# Patient Record
Sex: Female | Born: 1980
Health system: Southern US, Community
[De-identification: ages and names within clinical notes are randomized; demographics above are authoritative.]

## PROBLEM LIST (undated history)

## (undated) DIAGNOSIS — K589 Irritable bowel syndrome without diarrhea: Secondary | ICD-10-CM

## (undated) DIAGNOSIS — Z9049 Acquired absence of other specified parts of digestive tract: Secondary | ICD-10-CM

## (undated) DIAGNOSIS — F429 Obsessive-compulsive disorder, unspecified: Secondary | ICD-10-CM

## (undated) DIAGNOSIS — G4733 Obstructive sleep apnea (adult) (pediatric): Secondary | ICD-10-CM

## (undated) DIAGNOSIS — K219 Gastro-esophageal reflux disease without esophagitis: Secondary | ICD-10-CM

## (undated) DIAGNOSIS — M1990 Unspecified osteoarthritis, unspecified site: Secondary | ICD-10-CM

## (undated) DIAGNOSIS — G4730 Sleep apnea, unspecified: Secondary | ICD-10-CM

## (undated) DIAGNOSIS — R011 Cardiac murmur, unspecified: Secondary | ICD-10-CM

## (undated) DIAGNOSIS — Q231 Congenital insufficiency of aortic valve: Secondary | ICD-10-CM

## (undated) DIAGNOSIS — Z9221 Personal history of antineoplastic chemotherapy: Secondary | ICD-10-CM

## (undated) DIAGNOSIS — G43909 Migraine, unspecified, not intractable, without status migrainosus: Secondary | ICD-10-CM

## (undated) DIAGNOSIS — I10 Essential (primary) hypertension: Secondary | ICD-10-CM

## (undated) DIAGNOSIS — T7840XA Allergy, unspecified, initial encounter: Secondary | ICD-10-CM

## (undated) DIAGNOSIS — E119 Type 2 diabetes mellitus without complications: Secondary | ICD-10-CM

## (undated) DIAGNOSIS — K7581 Nonalcoholic steatohepatitis (NASH): Secondary | ICD-10-CM

## (undated) DIAGNOSIS — Z803 Family history of malignant neoplasm of breast: Secondary | ICD-10-CM

## (undated) DIAGNOSIS — Z800 Family history of malignant neoplasm of digestive organs: Secondary | ICD-10-CM

## (undated) DIAGNOSIS — G4700 Insomnia, unspecified: Secondary | ICD-10-CM

## (undated) DIAGNOSIS — G709 Myoneural disorder, unspecified: Secondary | ICD-10-CM

## (undated) DIAGNOSIS — F329 Major depressive disorder, single episode, unspecified: Secondary | ICD-10-CM

## (undated) DIAGNOSIS — K760 Fatty (change of) liver, not elsewhere classified: Secondary | ICD-10-CM

## (undated) DIAGNOSIS — E785 Hyperlipidemia, unspecified: Secondary | ICD-10-CM

## (undated) DIAGNOSIS — F32A Depression, unspecified: Secondary | ICD-10-CM

## (undated) DIAGNOSIS — F419 Anxiety disorder, unspecified: Secondary | ICD-10-CM

## (undated) DIAGNOSIS — H05019 Cellulitis of unspecified orbit: Secondary | ICD-10-CM

## (undated) DIAGNOSIS — R7303 Prediabetes: Secondary | ICD-10-CM

## (undated) DIAGNOSIS — C229 Malignant neoplasm of liver, not specified as primary or secondary: Secondary | ICD-10-CM

## (undated) DIAGNOSIS — N189 Chronic kidney disease, unspecified: Secondary | ICD-10-CM

## (undated) DIAGNOSIS — J45909 Unspecified asthma, uncomplicated: Secondary | ICD-10-CM

## (undated) HISTORY — DX: Unspecified asthma, uncomplicated: J45.909

## (undated) HISTORY — DX: Depression, unspecified: F32.A

## (undated) HISTORY — DX: Myoneural disorder, unspecified: G70.9

## (undated) HISTORY — DX: Major depressive disorder, single episode, unspecified: F32.9

## (undated) HISTORY — PX: APPENDECTOMY: SHX54

## (undated) HISTORY — DX: Cardiac murmur, unspecified: R01.1

## (undated) HISTORY — PX: ESOPHAGOGASTRODUODENOSCOPY: SHX1529

## (undated) HISTORY — PX: DENTAL SURGERY: SHX609

## (undated) HISTORY — DX: Chronic kidney disease, unspecified: N18.9

## (undated) HISTORY — PX: UPPER GASTROINTESTINAL ENDOSCOPY: SHX188

## (undated) HISTORY — DX: Family history of malignant neoplasm of breast: Z80.3

## (undated) HISTORY — DX: Prediabetes: R73.03

## (undated) HISTORY — DX: Family history of malignant neoplasm of digestive organs: Z80.0

## (undated) HISTORY — DX: Anxiety disorder, unspecified: F41.9

## (undated) HISTORY — DX: Obsessive-compulsive disorder, unspecified: F42.9

## (undated) HISTORY — DX: Obstructive sleep apnea (adult) (pediatric): G47.33

## (undated) HISTORY — DX: Type 2 diabetes mellitus without complications: E11.9

## (undated) HISTORY — DX: Personal history of antineoplastic chemotherapy: Z92.21

## (undated) HISTORY — DX: Congenital insufficiency of aortic valve: Q23.1

## (undated) HISTORY — DX: Unspecified osteoarthritis, unspecified site: M19.90

## (undated) HISTORY — DX: Sleep apnea, unspecified: G47.30

## (undated) HISTORY — PX: LIVER BIOPSY: SHX301

## (undated) HISTORY — DX: Allergy, unspecified, initial encounter: T78.40XA

## (undated) HISTORY — DX: Hyperlipidemia, unspecified: E78.5

## (undated) HISTORY — DX: Cellulitis of unspecified orbit: H05.019

## (undated) HISTORY — DX: Fatty (change of) liver, not elsewhere classified: K76.0

## (undated) HISTORY — DX: Nonalcoholic steatohepatitis (NASH): K75.81

## (undated) HISTORY — DX: Insomnia, unspecified: G47.00

## (undated) HISTORY — PX: COLONOSCOPY: SHX174

---

## 2012-06-02 ENCOUNTER — Emergency Department (HOSPITAL_COMMUNITY)
Admission: EM | Admit: 2012-06-02 | Discharge: 2012-06-02 | Disposition: A | Discharge status: Home / Self Care | Payer: BC Managed Care – PPO | Attending: Emergency Medicine | Admitting: Emergency Medicine

## 2012-06-02 ENCOUNTER — Encounter (HOSPITAL_COMMUNITY): Payer: Self-pay | Admitting: Emergency Medicine

## 2012-06-02 DIAGNOSIS — F17200 Nicotine dependence, unspecified, uncomplicated: Secondary | ICD-10-CM | POA: Insufficient documentation

## 2012-06-02 DIAGNOSIS — K029 Dental caries, unspecified: Secondary | ICD-10-CM

## 2012-06-02 DIAGNOSIS — I10 Essential (primary) hypertension: Secondary | ICD-10-CM | POA: Insufficient documentation

## 2012-06-02 HISTORY — DX: Essential (primary) hypertension: I10

## 2012-06-02 MED ORDER — FLUCONAZOLE 150 MG PO TABS: 150.0000 mg | ORAL_TABLET | Freq: Once | ORAL | Status: DC

## 2012-06-02 MED ORDER — PENICILLIN V POTASSIUM 500 MG PO TABS: 500.0000 mg | ORAL_TABLET | Freq: Four times a day (QID) | ORAL | Status: AC

## 2012-06-02 MED ORDER — HYDROCODONE-ACETAMINOPHEN 5-500 MG PO TABS: 1.0000 | ORAL_TABLET | Freq: Four times a day (QID) | ORAL | Status: DC | PRN

## 2012-06-02 NOTE — ED Notes (Signed)
MD at bedside. 

## 2012-06-02 NOTE — ED Provider Notes (Signed)
History     CSN: [id number]  Arrival date & time 06/02/12  0531   First MD Initiated Contact with Patient 06/02/12 0559      Chief Complaint  Patient presents with  . Dental Pain    (Consider location/radiation/quality/duration/timing/severity/associated sxs/prior treatment) Patient is a 31 y.o. female presenting with tooth pain. The history is provided by the patient. No language interpreter was used.  Dental PainThe primary symptoms include dental injury. Primary symptoms do not include fever or angioedema. The symptoms began more than 1 week ago. The symptoms are worsening. The symptoms are new. The symptoms occur constantly.  Additional symptoms include: dental sensitivity to temperature. Additional symptoms do not include: gum swelling, gum tenderness, facial swelling and excessive salivation. Medical issues include: smoking.    Past Medical History  Diagnosis Date  . Hypertension     History reviewed. No pertinent past surgical history.  No family history on file.  History  Substance Use Topics  . Smoking status: Current Every Day Smoker  . Smokeless tobacco: Not on file  . Alcohol Use: No    OB History    Grav Para Term Preterm Abortions TAB SAB Ect Mult Living                  Review of Systems  Constitutional: Negative for fever.  HENT: Negative for facial swelling and neck pain.   All other systems reviewed and are negative.    Allergies  Codeine  Home Medications   Current Outpatient Rx  Name  Route  Sig  Dispense  Refill  . ASPIRIN 325 MG PO TABS   Oral   Take 975 mg by mouth every 6 (six) hours as needed. For pain         . IBUPROFEN 200 MG PO CAPS   Oral   Take 400 mg by mouth 2 (two) times daily as needed. For pain           BP 138/89  Pulse 81  Temp 98 F (36.7 C)  Resp 16  Wt 200 lb (90.719 kg)  SpO2 99%  LMP 05/26/2012  Physical Exam  Constitutional: She is oriented to person, place, and time. She appears well-developed  and well-nourished. No distress.  HENT:  Head: Normocephalic and atraumatic.  Mouth/Throat: Oropharynx is clear and moist.    Eyes: Conjunctivae normal are normal. Pupils are equal, round, and reactive to light.  Neck: Normal range of motion. Neck supple.  Cardiovascular: Normal rate and regular rhythm.   Pulmonary/Chest: Effort normal and breath sounds normal. She has no wheezes. She has no rales.  Abdominal: Soft. Bowel sounds are normal. There is no tenderness. There is no rebound and no guarding.  Neurological: She is alert and oriented to person, place, and time.  Skin: Skin is warm and dry.  Psychiatric: She has a normal mood and affect.    ED Course  Procedures (including critical care time)  Labs Reviewed - No data to display No results found.   No diagnosis found.    MDM  Caries will refer and start pain meds and abx       Fannie Gathright K Renelda Kilian-Rasch, MD 06/02/12 [phone]

## 2012-06-02 NOTE — ED Notes (Signed)
Pt alert, arrives from home, c/o dental pain, onset a few days ago, denies trauma or injury, multiple carries noted, resp even unlabored, skin pwd

## 2012-06-09 ENCOUNTER — Emergency Department (INDEPENDENT_AMBULATORY_CARE_PROVIDER_SITE_OTHER)
Admission: EM | Admit: 2012-06-09 | Discharge: 2012-06-09 | Disposition: A | Discharge status: Home / Self Care | Payer: BC Managed Care – PPO | Source: Home / Self Care

## 2012-06-09 ENCOUNTER — Encounter (HOSPITAL_COMMUNITY): Payer: Self-pay | Admitting: Emergency Medicine

## 2012-06-09 DIAGNOSIS — K089 Disorder of teeth and supporting structures, unspecified: Secondary | ICD-10-CM

## 2012-06-09 DIAGNOSIS — K0889 Other specified disorders of teeth and supporting structures: Secondary | ICD-10-CM

## 2012-06-09 DIAGNOSIS — R209 Unspecified disturbances of skin sensation: Secondary | ICD-10-CM

## 2012-06-09 DIAGNOSIS — R200 Anesthesia of skin: Secondary | ICD-10-CM

## 2012-06-09 NOTE — ED Notes (Signed)
Pt c/o sudden on set of left jaw pain and shooting pain and numbness down left arm and numbness of left side. Recent dental surgery with tooth extraction. Pt states feels weak. m

## 2012-06-09 NOTE — ED Provider Notes (Signed)
History     CSN: [id number]  Arrival date & time 06/09/12  1809   None     Chief Complaint  Patient presents with  . Numbness    recent dental surgery x 1 wk ago. pt experienced sudden on set of left jaw pain and shooting pain and numbness down left side of body. around 5 p.m today    (Consider location/radiation/quality/duration/timing/severity/associated sxs/prior treatment) Patient is a 31 y.o. female presenting with tooth pain and hand pain. The history is provided by the patient.  Dental PainThe primary symptoms include mouth pain. The symptoms began more than 1 week ago. The symptoms are waxing and waning. Episode frequency: daily.  Affected locations include: cheek(s), gum(s) and teeth. At its highest the mouth pain was at 9/10.  Additional symptoms include: jaw pain, facial swelling and ear pain. Additional symptoms do not include: dental sensitivity to temperature, gum swelling, gum tenderness, purulent gums, trismus, trouble swallowing, pain with swallowing, excessive salivation, dry mouth, taste disturbance, smell disturbance, drooling, hearing loss, nosebleeds, swollen glands, goiter and fatigue. Medical issues include: periodontal disease. Associated medical issues comments: Patient reports tooth extraction.  Since then she has noted some left sided facial pain radiating into her ear.  States she was unable to get touch with oral surgeon for follow up..   Hand Pain  Additionally complains of left hand numbness and tingling, intermittent for more than one week.  No known aggravating and alleviating factors.  Works at an office, desk work and lifting.  States no known injury.  No previous musculoskeletal injury.  Past Medical History  Diagnosis Date  . Hypertension     Past Surgical History  Procedure Date  . Dental surgery     History reviewed. No pertinent family history.  History  Substance Use Topics  . Smoking status: Current Every Day Smoker  . Smokeless  tobacco: Not on file  . Alcohol Use: No    OB History    Grav Para Term Preterm Abortions TAB SAB Ect Mult Living                  Review of Systems  Constitutional: Negative for fatigue.  HENT: Positive for ear pain and facial swelling. Negative for hearing loss, nosebleeds, drooling and trouble swallowing.   All other systems reviewed and are negative.    Allergies  Codeine  Home Medications   Current Outpatient Rx  Name  Route  Sig  Dispense  Refill  . ASPIRIN 325 MG PO TABS   Oral   Take 975 mg by mouth every 6 (six) hours as needed. For pain         . FLUCONAZOLE 150 MG PO TABS   Oral   Take 1 tablet (150 mg total) by mouth once.   1 tablet   0   . HYDROCODONE-ACETAMINOPHEN 5-500 MG PO TABS   Oral   Take 1 tablet by mouth every 6 (six) hours as needed for pain.   10 tablet   0   . IBUPROFEN 200 MG PO CAPS   Oral   Take 400 mg by mouth 2 (two) times daily as needed. For pain           BP 147/90  Pulse 79  Temp 98.7 F (37.1 C) (Oral)  Resp 20  SpO2 100%  LMP 05/26/2012  Physical Exam  Nursing note and vitals reviewed. Constitutional: She is oriented to person, place, and time. Vital signs are normal. She appears well-developed and  well-nourished. She is active and cooperative.  HENT:  Head: Normocephalic.  Right Ear: Tympanic membrane and external ear normal.  Left Ear: Tympanic membrane and external ear normal.  Nose: Nose normal.  Mouth/Throat: Uvula is midline, oropharynx is clear and moist and mucous membranes are normal. No oropharyngeal exudate.       Mild left jaw/facial swelling, no tenderness upon palpation.  No tmj abnormalities.  Eyes: Conjunctivae normal are normal. Pupils are equal, round, and reactive to light. No scleral icterus.  Neck: Trachea normal and normal range of motion. Neck supple.  Cardiovascular: Normal rate, regular rhythm, normal heart sounds and intact distal pulses.   Pulmonary/Chest: Effort normal and breath  sounds normal.  Musculoskeletal:       Right wrist: Normal.       Left wrist: Normal.       + phalen sign left wrist  Lymphadenopathy:    She has no cervical adenopathy.  Neurological: She is alert and oriented to person, place, and time. No cranial nerve deficit or sensory deficit.  Skin: Skin is warm and dry.  Psychiatric: She has a normal mood and affect. Her speech is normal and behavior is normal. Judgment and thought content normal. Cognition and memory are normal.    ED Course  Procedures (including critical care time)  Labs Reviewed - No data to display No results found.   1. Pain, dental   2. Numbness and tingling in left hand       MDM  Ace wrap for wrist.  Continue antibiotics as previously prescribed.  Follow up with oral surgeon this week as scheduled.        Awilda Metro, NP 06/15/12 [phone]

## 2012-06-15 NOTE — ED Provider Notes (Signed)
Medical screening examination/treatment/procedure(s) were performed by non-physician practitioner and as supervising physician I was immediately available for consultation/collaboration.  Philipp Deputy, M.D.   Harden Mo, MD 06/15/12 [phone]

## 2012-10-01 ENCOUNTER — Encounter (HOSPITAL_COMMUNITY): Payer: Self-pay | Admitting: Emergency Medicine

## 2012-10-01 ENCOUNTER — Emergency Department (HOSPITAL_COMMUNITY)
Admission: EM | Admit: 2012-10-01 | Discharge: 2012-10-01 | Disposition: A | Discharge status: Home / Self Care | Payer: BC Managed Care – PPO | Attending: Emergency Medicine | Admitting: Emergency Medicine

## 2012-10-01 ENCOUNTER — Emergency Department (HOSPITAL_COMMUNITY): Payer: BC Managed Care – PPO

## 2012-10-01 DIAGNOSIS — Y939 Activity, unspecified: Secondary | ICD-10-CM | POA: Insufficient documentation

## 2012-10-01 DIAGNOSIS — S92902A Unspecified fracture of left foot, initial encounter for closed fracture: Secondary | ICD-10-CM

## 2012-10-01 DIAGNOSIS — S92909A Unspecified fracture of unspecified foot, initial encounter for closed fracture: Secondary | ICD-10-CM | POA: Insufficient documentation

## 2012-10-01 DIAGNOSIS — F17200 Nicotine dependence, unspecified, uncomplicated: Secondary | ICD-10-CM | POA: Insufficient documentation

## 2012-10-01 DIAGNOSIS — Y929 Unspecified place or not applicable: Secondary | ICD-10-CM | POA: Insufficient documentation

## 2012-10-01 DIAGNOSIS — X500XXA Overexertion from strenuous movement or load, initial encounter: Secondary | ICD-10-CM | POA: Insufficient documentation

## 2012-10-01 DIAGNOSIS — I10 Essential (primary) hypertension: Secondary | ICD-10-CM | POA: Insufficient documentation

## 2012-10-01 DIAGNOSIS — R269 Unspecified abnormalities of gait and mobility: Secondary | ICD-10-CM | POA: Insufficient documentation

## 2012-10-01 MED ORDER — OXYCODONE-ACETAMINOPHEN 5-325 MG PO TABS
2.0000 | ORAL_TABLET | Freq: Once | ORAL | Status: AC
  Administered 2012-10-01: 2 via ORAL
  Filled 2012-10-01: qty 2

## 2012-10-01 MED ORDER — OXYCODONE-ACETAMINOPHEN 5-325 MG PO TABS: 1.0000 | ORAL_TABLET | ORAL | Status: DC | PRN

## 2012-10-01 NOTE — ED Notes (Signed)
Skin warm dry, pink, can wiggle digits, sensation present, cap refill less than 2 seconds, and pulse present

## 2012-10-01 NOTE — ED Notes (Signed)
Pt states she twisted R ankle on steps approx 1 1/2 hours ago and is now having R foot pain.  Took ibupofen 400 mg just pta.

## 2012-10-01 NOTE — ED Notes (Signed)
Pt states understanding of discharge instructions 

## 2012-10-01 NOTE — ED Provider Notes (Signed)
Medical screening examination/treatment/procedure(s) were performed by non-physician practitioner and as supervising physician I was immediately available for consultation/collaboration.   Wandra Arthurs, MD 10/01/12 [phone]

## 2012-10-01 NOTE — ED Provider Notes (Signed)
History     CSN: [id number]  Arrival date & time 10/01/12  0215   First MD Initiated Contact with Patient 10/01/12 0402      Chief Complaint  Patient presents with  . Foot Pain    (Consider location/radiation/quality/duration/timing/severity/associated sxs/prior treatment) HPI Comments: Patient state she stepped wrong twisting her R foot now with mid foot pain unable to bear weight Took Advil without relief   Patient is a 32 y.o. female presenting with lower extremity pain. The history is provided by the patient.  Foot Pain This is a new problem. The current episode started today. The problem has been gradually worsening. Associated symptoms include arthralgias. Pertinent negatives include no fever, joint swelling, numbness or weakness. The symptoms are aggravated by exertion. She has tried NSAIDs for the symptoms. The treatment provided no relief.    Past Medical History  Diagnosis Date  . Hypertension     Past Surgical History  Procedure Laterality Date  . Dental surgery      No family history on file.  History  Substance Use Topics  . Smoking status: Current Every Day Smoker  . Smokeless tobacco: Not on file  . Alcohol Use: No    OB History   Grav Para Term Preterm Abortions TAB SAB Ect Mult Living                  Review of Systems  Constitutional: Negative for fever.  Musculoskeletal: Positive for arthralgias and gait problem. Negative for joint swelling.  Skin: Negative for wound.  Neurological: Negative for weakness and numbness.  All other systems reviewed and are negative.    Allergies  Codeine  Home Medications   Current Outpatient Rx  Name  Route  Sig  Dispense  Refill  . aspirin 325 MG tablet   Oral   Take 975 mg by mouth every 6 (six) hours as needed. For pain         . Ibuprofen (ADVIL MIGRAINE) 200 MG CAPS   Oral   Take 400 mg by mouth 2 (two) times daily as needed. For pain         . oxyCODONE-acetaminophen (PERCOCET/ROXICET)  5-325 MG per tablet   Oral   Take 1 tablet by mouth every 4 (four) hours as needed for pain.   30 tablet   0     BP 147/101  Pulse 100  Temp(Src) 98.1 F (36.7 C) (Oral)  Resp 16  SpO2 97%  LMP 09/12/2012  Physical Exam  Constitutional: She is oriented to person, place, and time. She appears well-developed and well-nourished.  HENT:  Head: Normocephalic and atraumatic.  Eyes: Pupils are equal, round, and reactive to light.  Neck: Normal range of motion.  Cardiovascular: Normal rate.   Pulmonary/Chest: Effort normal.  Musculoskeletal: She exhibits tenderness. She exhibits no edema.       Right ankle: She exhibits decreased range of motion and swelling. She exhibits no ecchymosis, no deformity and no laceration. Tenderness.       Feet:  Neurological: She is alert and oriented to person, place, and time.  Skin: Skin is warm. No erythema.    ED Course  Procedures (including critical care time)  Labs Reviewed - No data to display Dg Foot Complete Right  10/01/2012  *RADIOLOGY REPORT*  Clinical Data: Fall, right foot pain  RIGHT FOOT COMPLETE - 3+ VIEW  Comparison: None.  Findings: Linear calcific density along the medial margin of the base of the second metatarsal.  This  can be seen with a Lisfranc ligament avulsion injury.  No appreciable subluxation or dislocation.  IMPRESSION: Linear density along the base of the second metatarsal may reflect a Lisfranc avulsion fracture. Correlate with area of pain.  No subluxation or dislocation.   Original Report Authenticated By: Carlos Levering, M.D.      1. Foot fracture, left, closed, initial encounter       MDM   Will place patient in Jones wrap, crutches, non weight bearing and FU with Dr. Doreene Burke, NP 10/01/12 0449  Garald Balding, NP 10/01/12 [phone]

## 2012-11-20 ENCOUNTER — Other Ambulatory Visit: Payer: Self-pay | Admitting: Family Medicine

## 2012-11-20 ENCOUNTER — Other Ambulatory Visit (HOSPITAL_COMMUNITY)
Admission: RE | Admit: 2012-11-20 | Discharge: 2012-11-20 | Disposition: A | Discharge status: Home / Self Care | Payer: BC Managed Care – PPO | Source: Ambulatory Visit | Attending: Family Medicine | Admitting: Family Medicine

## 2012-11-20 DIAGNOSIS — Z124 Encounter for screening for malignant neoplasm of cervix: Secondary | ICD-10-CM | POA: Insufficient documentation

## 2013-05-20 ENCOUNTER — Emergency Department (HOSPITAL_COMMUNITY)
Admission: EM | Admit: 2013-05-20 | Discharge: 2013-05-20 | Disposition: A | Discharge status: Home / Self Care | Payer: No Typology Code available for payment source | Source: Home / Self Care

## 2013-05-25 DIAGNOSIS — Z9049 Acquired absence of other specified parts of digestive tract: Secondary | ICD-10-CM

## 2013-05-25 HISTORY — DX: Acquired absence of other specified parts of digestive tract: Z90.49

## 2013-06-05 ENCOUNTER — Ambulatory Visit: Payer: BC Managed Care – PPO

## 2013-06-06 ENCOUNTER — Encounter (HOSPITAL_COMMUNITY): Payer: Self-pay | Admitting: Emergency Medicine

## 2013-06-06 ENCOUNTER — Observation Stay (HOSPITAL_COMMUNITY)
Admission: EM | Admit: 2013-06-06 | Discharge: 2013-06-07 | Disposition: A | Discharge status: Home / Self Care | Payer: PRIVATE HEALTH INSURANCE | Attending: Surgery | Admitting: Surgery

## 2013-06-06 ENCOUNTER — Emergency Department (HOSPITAL_COMMUNITY)
Admission: EM | Admit: 2013-06-06 | Discharge: 2013-06-06 | Disposition: A | Discharge status: Hospice | Payer: PRIVATE HEALTH INSURANCE | Source: Home / Self Care | Attending: Emergency Medicine | Admitting: Emergency Medicine

## 2013-06-06 ENCOUNTER — Encounter (HOSPITAL_COMMUNITY)
Admission: EM | Disposition: A | Discharge status: Home / Self Care | Payer: Self-pay | Source: Home / Self Care | Attending: Emergency Medicine

## 2013-06-06 ENCOUNTER — Emergency Department (HOSPITAL_COMMUNITY): Payer: PRIVATE HEALTH INSURANCE

## 2013-06-06 ENCOUNTER — Observation Stay (HOSPITAL_COMMUNITY): Payer: PRIVATE HEALTH INSURANCE | Admitting: Anesthesiology

## 2013-06-06 ENCOUNTER — Encounter (HOSPITAL_COMMUNITY): Payer: PRIVATE HEALTH INSURANCE | Admitting: Anesthesiology

## 2013-06-06 DIAGNOSIS — I10 Essential (primary) hypertension: Secondary | ICD-10-CM

## 2013-06-06 DIAGNOSIS — R1031 Right lower quadrant pain: Secondary | ICD-10-CM

## 2013-06-06 DIAGNOSIS — Z23 Encounter for immunization: Secondary | ICD-10-CM | POA: Insufficient documentation

## 2013-06-06 DIAGNOSIS — K219 Gastro-esophageal reflux disease without esophagitis: Secondary | ICD-10-CM

## 2013-06-06 DIAGNOSIS — K37 Unspecified appendicitis: Secondary | ICD-10-CM

## 2013-06-06 DIAGNOSIS — K3580 Unspecified acute appendicitis: Principal | ICD-10-CM | POA: Insufficient documentation

## 2013-06-06 DIAGNOSIS — K589 Irritable bowel syndrome without diarrhea: Secondary | ICD-10-CM

## 2013-06-06 DIAGNOSIS — Z87891 Personal history of nicotine dependence: Secondary | ICD-10-CM | POA: Insufficient documentation

## 2013-06-06 HISTORY — DX: Essential (primary) hypertension: I10

## 2013-06-06 HISTORY — PX: LAPAROSCOPIC APPENDECTOMY: SHX408

## 2013-06-06 HISTORY — DX: Irritable bowel syndrome, unspecified: K58.9

## 2013-06-06 HISTORY — DX: Gastro-esophageal reflux disease without esophagitis: K21.9

## 2013-06-06 LAB — URINALYSIS, ROUTINE W REFLEX MICROSCOPIC
Bilirubin Urine: NEGATIVE
Bilirubin Urine: NEGATIVE
Glucose, UA: NEGATIVE mg/dL
Hgb urine dipstick: NEGATIVE
Nitrite: NEGATIVE
Protein, ur: NEGATIVE mg/dL
Specific Gravity, Urine: 1.034 — ABNORMAL HIGH (ref 1.005–1.030)
Urobilinogen, UA: 0.2 mg/dL (ref 0.0–1.0)
pH: 6 (ref 5.0–8.0)

## 2013-06-06 LAB — COMPREHENSIVE METABOLIC PANEL
AST: 21 U/L (ref 0–37)
Albumin: 3.2 g/dL — ABNORMAL LOW (ref 3.5–5.2)
Alkaline Phosphatase: 48 U/L (ref 39–117)
BUN: 14 mg/dL (ref 6–23)
Calcium: 8.6 mg/dL (ref 8.4–10.5)
Chloride: 104 mEq/L (ref 96–112)
Potassium: 4 mEq/L (ref 3.5–5.1)
Sodium: 138 mEq/L (ref 135–145)
Total Protein: 6.4 g/dL (ref 6.0–8.3)

## 2013-06-06 LAB — CBC WITH DIFFERENTIAL/PLATELET
Basophils Absolute: 0 10*3/uL (ref 0.0–0.1)
Basophils Relative: 0 % (ref 0–1)
Eosinophils Absolute: 0.3 10*3/uL (ref 0.0–0.7)
Eosinophils Relative: 3 % (ref 0–5)
MCH: 29.8 pg (ref 26.0–34.0)
MCHC: 34.2 g/dL (ref 30.0–36.0)
Monocytes Relative: 7 % (ref 3–12)
Neutro Abs: 5.8 10*3/uL (ref 1.7–7.7)
Neutrophils Relative %: 57 % (ref 43–77)
Platelets: 283 10*3/uL (ref 150–400)
RDW: 13.2 % (ref 11.5–15.5)

## 2013-06-06 LAB — URINE MICROSCOPIC-ADD ON

## 2013-06-06 LAB — POCT URINALYSIS DIP (DEVICE)
Glucose, UA: NEGATIVE mg/dL
Specific Gravity, Urine: >=1.03 (ref 1.005–1.030)

## 2013-06-06 LAB — POCT PREGNANCY, URINE: Preg Test, Ur: NEGATIVE

## 2013-06-06 LAB — LIPASE, BLOOD: Lipase: 17 U/L (ref 11–59)

## 2013-06-06 SURGERY — APPENDECTOMY, LAPAROSCOPIC
Anesthesia: General | Site: Abdomen | Wound class: Clean Contaminated

## 2013-06-06 MED ORDER — LEVONORGEST-ETH ESTRAD 91-DAY 0.15-0.03 &0.01 MG PO TABS
1.0000 | ORAL_TABLET | Freq: Every day | ORAL | Status: DC
  Administered 2013-06-06: 1 via ORAL

## 2013-06-06 MED ORDER — ACETAMINOPHEN 325 MG PO TABS: 650.0000 mg | ORAL_TABLET | ORAL | Status: DC | PRN

## 2013-06-06 MED ORDER — ROCURONIUM BROMIDE 100 MG/10ML IV SOLN
INTRAVENOUS | Status: DC | PRN
  Administered 2013-06-06: 35 mg via INTRAVENOUS

## 2013-06-06 MED ORDER — LACTATED RINGERS IV SOLN
INTRAVENOUS | Status: DC | PRN
  Administered 2013-06-06: 18:00:00 via INTRAVENOUS

## 2013-06-06 MED ORDER — BUPIVACAINE-EPINEPHRINE 0.25% -1:200000 IJ SOLN
INTRAMUSCULAR | Status: DC | PRN
  Administered 2013-06-06: 8.5 mL

## 2013-06-06 MED ORDER — HYDROMORPHONE HCL PF 1 MG/ML IJ SOLN: 0.2500 mg | INTRAMUSCULAR | Status: DC | PRN

## 2013-06-06 MED ORDER — GLYCOPYRROLATE 0.2 MG/ML IJ SOLN
INTRAMUSCULAR | Status: DC | PRN
  Administered 2013-06-06: .8 mg via INTRAVENOUS

## 2013-06-06 MED ORDER — ACETAMINOPHEN 325 MG PO TABS: 650.0000 mg | ORAL_TABLET | Freq: Four times a day (QID) | ORAL | Status: DC | PRN

## 2013-06-06 MED ORDER — FENTANYL CITRATE 0.05 MG/ML IJ SOLN
INTRAMUSCULAR | Status: DC | PRN
  Administered 2013-06-06 (×3): 50 ug via INTRAVENOUS

## 2013-06-06 MED ORDER — PROPOFOL 10 MG/ML IV BOLUS
INTRAVENOUS | Status: DC | PRN
  Administered 2013-06-06: 200 mg via INTRAVENOUS

## 2013-06-06 MED ORDER — ONDANSETRON HCL 4 MG PO TABS: 4.0000 mg | ORAL_TABLET | Freq: Four times a day (QID) | ORAL | Status: DC | PRN

## 2013-06-06 MED ORDER — PANTOPRAZOLE SODIUM 40 MG IV SOLR
40.0000 mg | Freq: Every day | INTRAVENOUS | Status: DC
  Administered 2013-06-06: 40 mg via INTRAVENOUS
  Filled 2013-06-06 (×2): qty 40

## 2013-06-06 MED ORDER — HYDROMORPHONE HCL PF 1 MG/ML IJ SOLN
0.5000 mg | INTRAMUSCULAR | Status: DC | PRN
  Administered 2013-06-06: 1 mg via INTRAVENOUS
  Filled 2013-06-06: qty 1

## 2013-06-06 MED ORDER — PROMETHAZINE HCL 25 MG/ML IJ SOLN: 6.2500 mg | INTRAMUSCULAR | Status: DC | PRN

## 2013-06-06 MED ORDER — SODIUM CHLORIDE 0.9 % IV SOLN
1000.0000 mL | Freq: Once | INTRAVENOUS | Status: AC
  Administered 2013-06-06: 1000 mL via INTRAVENOUS

## 2013-06-06 MED ORDER — IOHEXOL 300 MG/ML  SOLN
25.0000 mL | INTRAMUSCULAR | Status: AC
  Administered 2013-06-06: 25 mL via ORAL

## 2013-06-06 MED ORDER — ENOXAPARIN SODIUM 40 MG/0.4ML ~~LOC~~ SOLN
40.0000 mg | SUBCUTANEOUS | Status: DC
  Filled 2013-06-06: qty 0.4

## 2013-06-06 MED ORDER — MORPHINE SULFATE 2 MG/ML IJ SOLN
2.0000 mg | INTRAMUSCULAR | Status: DC | PRN
  Filled 2013-06-06: qty 1

## 2013-06-06 MED ORDER — IOHEXOL 300 MG/ML  SOLN
80.0000 mL | Freq: Once | INTRAMUSCULAR | Status: AC | PRN
  Administered 2013-06-06: 80 mL via INTRAVENOUS

## 2013-06-06 MED ORDER — KETOROLAC TROMETHAMINE 30 MG/ML IJ SOLN
30.0000 mg | Freq: Four times a day (QID) | INTRAMUSCULAR | Status: DC
  Administered 2013-06-06 – 2013-06-07 (×3): 30 mg via INTRAVENOUS
  Filled 2013-06-06 (×6): qty 1

## 2013-06-06 MED ORDER — LACTATED RINGERS IV SOLN
INTRAVENOUS | Status: DC
  Administered 2013-06-06: 16:00:00 via INTRAVENOUS

## 2013-06-06 MED ORDER — LIDOCAINE HCL (CARDIAC) 20 MG/ML IV SOLN
INTRAVENOUS | Status: DC | PRN
  Administered 2013-06-06: 60 mg via INTRAVENOUS

## 2013-06-06 MED ORDER — OXYCODONE-ACETAMINOPHEN 5-325 MG PO TABS
1.0000 | ORAL_TABLET | ORAL | Status: DC | PRN
  Administered 2013-06-07 (×2): 2 via ORAL
  Filled 2013-06-06 (×2): qty 2

## 2013-06-06 MED ORDER — BUPIVACAINE-EPINEPHRINE PF 0.25-1:200000 % IJ SOLN
INTRAMUSCULAR | Status: AC
  Filled 2013-06-06: qty 30

## 2013-06-06 MED ORDER — NEOSTIGMINE METHYLSULFATE 1 MG/ML IJ SOLN
INTRAMUSCULAR | Status: DC | PRN
  Administered 2013-06-06: 5 mg via INTRAVENOUS

## 2013-06-06 MED ORDER — FENTANYL CITRATE 0.05 MG/ML IJ SOLN: 50.0000 ug | Freq: Once | INTRAMUSCULAR | Status: DC

## 2013-06-06 MED ORDER — ONDANSETRON HCL 4 MG/2ML IJ SOLN: 4.0000 mg | Freq: Four times a day (QID) | INTRAMUSCULAR | Status: DC | PRN

## 2013-06-06 MED ORDER — KCL IN DEXTROSE-NACL 20-5-0.45 MEQ/L-%-% IV SOLN
INTRAVENOUS | Status: DC
  Administered 2013-06-06: 22:00:00 via INTRAVENOUS
  Filled 2013-06-06 (×2): qty 1000

## 2013-06-06 MED ORDER — DIPHENHYDRAMINE HCL 50 MG/ML IJ SOLN: 12.5000 mg | Freq: Four times a day (QID) | INTRAMUSCULAR | Status: DC | PRN

## 2013-06-06 MED ORDER — MIDAZOLAM HCL 2 MG/2ML IJ SOLN: 1.0000 mg | INTRAMUSCULAR | Status: DC | PRN

## 2013-06-06 MED ORDER — SUCCINYLCHOLINE CHLORIDE 20 MG/ML IJ SOLN
INTRAMUSCULAR | Status: DC | PRN
  Administered 2013-06-06: 100 mg via INTRAVENOUS

## 2013-06-06 MED ORDER — MIDAZOLAM HCL 5 MG/5ML IJ SOLN
INTRAMUSCULAR | Status: DC | PRN
  Administered 2013-06-06: 2 mg via INTRAVENOUS

## 2013-06-06 MED ORDER — ONDANSETRON HCL 4 MG/2ML IJ SOLN
4.0000 mg | Freq: Once | INTRAMUSCULAR | Status: AC
Start: 2013-06-06 — End: 2013-06-06
  Administered 2013-06-06: 4 mg via INTRAVENOUS
  Filled 2013-06-06: qty 2

## 2013-06-06 MED ORDER — HYDROMORPHONE HCL PF 1 MG/ML IJ SOLN: 0.5000 mg | INTRAMUSCULAR | Status: DC | PRN

## 2013-06-06 MED ORDER — METRONIDAZOLE IN NACL 5-0.79 MG/ML-% IV SOLN
500.0000 mg | Freq: Three times a day (TID) | INTRAVENOUS | Status: DC
  Administered 2013-06-06: 500 mg via INTRAVENOUS
  Filled 2013-06-06 (×3): qty 100

## 2013-06-06 MED ORDER — SODIUM CHLORIDE 0.9 % IV SOLN
1000.0000 mL | INTRAVENOUS | Status: DC
Start: 2013-06-06 — End: 2013-06-06

## 2013-06-06 MED ORDER — KCL IN DEXTROSE-NACL 20-5-0.45 MEQ/L-%-% IV SOLN
INTRAVENOUS | Status: DC
  Filled 2013-06-06 (×3): qty 1000

## 2013-06-06 MED ORDER — CEFTRIAXONE SODIUM 2 G IJ SOLR
2.0000 g | INTRAMUSCULAR | Status: DC
  Administered 2013-06-06: 2 g via INTRAVENOUS
  Filled 2013-06-06: qty 2

## 2013-06-06 MED ORDER — KETOROLAC TROMETHAMINE 30 MG/ML IJ SOLN
INTRAMUSCULAR | Status: AC
  Administered 2013-06-06: 30 mg via INTRAVENOUS
  Filled 2013-06-06: qty 1

## 2013-06-06 MED ORDER — ACETAMINOPHEN 650 MG RE SUPP: 650.0000 mg | Freq: Four times a day (QID) | RECTAL | Status: DC | PRN

## 2013-06-06 MED ORDER — SODIUM CHLORIDE 0.9 % IR SOLN
Status: DC | PRN
  Administered 2013-06-06: 1000 mL

## 2013-06-06 MED ORDER — DEXAMETHASONE SODIUM PHOSPHATE 10 MG/ML IJ SOLN
INTRAMUSCULAR | Status: DC | PRN
  Administered 2013-06-06: 8 mg via INTRAVENOUS

## 2013-06-06 MED ORDER — HYDROMORPHONE HCL PF 1 MG/ML IJ SOLN
1.0000 mg | Freq: Once | INTRAMUSCULAR | Status: AC
  Administered 2013-06-06: 1 mg via INTRAVENOUS
  Filled 2013-06-06: qty 1

## 2013-06-06 MED ORDER — DIPHENHYDRAMINE HCL 12.5 MG/5ML PO ELIX: 12.5000 mg | ORAL_SOLUTION | Freq: Four times a day (QID) | ORAL | Status: DC | PRN

## 2013-06-06 SURGICAL SUPPLY — 40 items
APPLIER CLIP ROT 10 11.4 M/L (STAPLE)
BENZOIN TINCTURE PRP APPL 2/3 (GAUZE/BANDAGES/DRESSINGS) ×2 IMPLANT
BLADE SURG ROTATE 9660 (MISCELLANEOUS) IMPLANT
CANISTER SUCTION 2500CC (MISCELLANEOUS) ×2 IMPLANT
CHLORAPREP W/TINT 26ML (MISCELLANEOUS) ×2 IMPLANT
CLIP APPLIE ROT 10 11.4 M/L (STAPLE) IMPLANT
COVER SURGICAL LIGHT HANDLE (MISCELLANEOUS) ×2 IMPLANT
CUTTER FLEX LINEAR 45M (STAPLE) ×2 IMPLANT
DECANTER SPIKE VIAL GLASS SM (MISCELLANEOUS) ×2 IMPLANT
DRAPE UTILITY 15X26 W/TAPE STR (DRAPE) ×4 IMPLANT
DRSG TEGADERM 2-3/8X2-3/4 SM (GAUZE/BANDAGES/DRESSINGS) ×6 IMPLANT
ELECT REM PT RETURN 9FT ADLT (ELECTROSURGICAL) ×2
ELECTRODE REM PT RTRN 9FT ADLT (ELECTROSURGICAL) ×1 IMPLANT
ENDOLOOP SUT PDS II  0 18 (SUTURE)
ENDOLOOP SUT PDS II 0 18 (SUTURE) IMPLANT
FILTER SMOKE EVAC LAPAROSHD (FILTER) ×2 IMPLANT
GAUZE SPONGE 2X2 8PLY STRL LF (GAUZE/BANDAGES/DRESSINGS) ×1 IMPLANT
GLOVE BIO SURGEON STRL SZ7 (GLOVE) ×2 IMPLANT
GLOVE BIOGEL PI IND STRL 7.5 (GLOVE) ×1 IMPLANT
GLOVE BIOGEL PI INDICATOR 7.5 (GLOVE) ×1
GOWN STRL NON-REIN LRG LVL3 (GOWN DISPOSABLE) ×6 IMPLANT
KIT BASIN OR (CUSTOM PROCEDURE TRAY) ×2 IMPLANT
KIT ROOM TURNOVER OR (KITS) ×2 IMPLANT
NS IRRIG 1000ML POUR BTL (IV SOLUTION) ×2 IMPLANT
PAD ARMBOARD 7.5X6 YLW CONV (MISCELLANEOUS) ×4 IMPLANT
POUCH SPECIMEN RETRIEVAL 10MM (ENDOMECHANICALS) ×2 IMPLANT
RELOAD STAPLE TA45 3.5 REG BLU (ENDOMECHANICALS) ×2 IMPLANT
SCALPEL HARMONIC ACE (MISCELLANEOUS) ×2 IMPLANT
SET IRRIG TUBING LAPAROSCOPIC (IRRIGATION / IRRIGATOR) ×2 IMPLANT
SPECIMEN JAR SMALL (MISCELLANEOUS) ×2 IMPLANT
SPONGE GAUZE 2X2 STER 10/PKG (GAUZE/BANDAGES/DRESSINGS) ×1
STRIP CLOSURE SKIN 1/2X4 (GAUZE/BANDAGES/DRESSINGS) ×2 IMPLANT
SUT MNCRL AB 4-0 PS2 18 (SUTURE) ×2 IMPLANT
TOWEL OR 17X24 6PK STRL BLUE (TOWEL DISPOSABLE) ×2 IMPLANT
TOWEL OR 17X26 10 PK STRL BLUE (TOWEL DISPOSABLE) ×2 IMPLANT
TRAY FOLEY CATH 16FR SILVER (SET/KITS/TRAYS/PACK) IMPLANT
TRAY LAPAROSCOPIC (CUSTOM PROCEDURE TRAY) ×2 IMPLANT
TROCAR XCEL BLADELESS 5X75MML (TROCAR) ×4 IMPLANT
TROCAR XCEL BLUNT TIP 100MML (ENDOMECHANICALS) ×2 IMPLANT
WATER STERILE IRR 1000ML POUR (IV SOLUTION) IMPLANT

## 2013-06-06 NOTE — ED Notes (Signed)
States has IBS and treats it with probiotics and did not use them this weekend, having abd pain that does not feel like IBS. Having diarrhea -- x 1.

## 2013-06-06 NOTE — Anesthesia Procedure Notes (Signed)
Procedure Name: Intubation Date/Time: 06/06/2013 5:49 PM Performed by: Maeola Harman Pre-anesthesia Checklist: Patient identified, Suction available, Emergency Drugs available, Patient being monitored and Timeout performed Patient Re-evaluated:Patient Re-evaluated prior to inductionOxygen Delivery Method: Circle system utilized Preoxygenation: Pre-oxygenation with 100% oxygen Intubation Type: IV induction and Rapid sequence Ventilation: Mask ventilation without difficulty Laryngoscope Size: Mac and 3 Grade View: Grade I Tube type: Oral Tube size: 7.5 mm Number of attempts: 1 Airway Equipment and Method: Stylet Placement Confirmation: ETT inserted through vocal cords under direct vision,  positive ETCO2 and breath sounds checked- equal and bilateral Secured at: 21 cm Tube secured with: Tape Dental Injury: Teeth and Oropharynx as per pre-operative assessment  Comments: Easy atraumatic induction and intubation with MAC 3 blade.  Dr. Chriss Driver verified placement of ETT.  Stephanie Session, CRNA

## 2013-06-06 NOTE — Progress Notes (Signed)
RT setup CPAP 9cmH20 w/ 2 L O2 bled in. Pt using nasal mask. No water in humidity chamber. Pt stated that she will put self on when ready and will call if RT needed. RN understands to hook up O2 tubing to flow meter when patient is ready. RT will continue to monitor.

## 2013-06-06 NOTE — ED Provider Notes (Signed)
CSN: [id number]     Arrival date & time 06/06/13  5852 History   First MD Initiated Contact with Patient 06/06/13 [phone]     Chief Complaint  Patient presents with  . Abdominal Pain   (Consider location/radiation/quality/duration/timing/severity/associated sxs/prior Treatment) HPI Comments: 32 year old female presents from urgent care with 2 days of abdominal pain. She states it started in the right lower cautery and has not changed. She has a history of kidney stones which says it feels like a constant kidney stone pain except in the wrong location of her normal stones. She also has IBS and took some of her treatments she was takes but has not helped. She rates the pain as a 7/10. She's felt nauseous without vomiting. Her maximum temp was 100.5 yesterday. She was seen at an urgent care and sent here for a CT scan. She denies any dysuria, vaginal bleeding, or vaginal discharge.   Past Medical History  Diagnosis Date  . Hypertension    Past Surgical History  Procedure Laterality Date  . Dental surgery     History reviewed. No pertinent family history. History  Substance Use Topics  . Smoking status: Current Every Day Smoker  . Smokeless tobacco: Not on file  . Alcohol Use: No   OB History   Grav Para Term Preterm Abortions TAB SAB Ect Mult Living                 Review of Systems  Constitutional: Positive for fever.  Cardiovascular: Negative for chest pain.  Gastrointestinal: Positive for nausea, abdominal pain and diarrhea (no different than her normal stools). Negative for vomiting and constipation.  Genitourinary: Negative for dysuria and flank pain.  Musculoskeletal: Negative for back pain.  All other systems reviewed and are negative.    Allergies  Codeine  Home Medications   Current Outpatient Rx  Name  Route  Sig  Dispense  Refill  . Levonorgestrel-Ethinyl Estradiol (AMETHIA,CAMRESE) 0.15-0.03 &0.01 MG tablet   Oral   Take 1 tablet by mouth daily.         .  naproxen sodium (ANAPROX) 220 MG tablet   Oral   Take 440 mg by mouth at bedtime.         Marland Kitchen omeprazole (PRILOSEC) 20 MG capsule   Oral   Take 20 mg by mouth daily.         . Prenatal Vit-Fe Sulfate-FA (PRENATAL VITAMIN PO)   Oral   Take 1 tablet by mouth daily.          . Probiotic Product (PROBIOTIC DAILY PO)   Oral   Take 500 mg by mouth.          BP 158/104  Pulse 92  Temp(Src) 97.9 F (36.6 C) (Oral)  Resp 18  Wt 211 lb 3 oz (95.794 kg)  SpO2 97% Physical Exam  Vitals reviewed. Constitutional: She is oriented to person, place, and time. She appears well-developed and well-nourished. No distress.  HENT:  Head: Normocephalic and atraumatic.  Right Ear: External ear normal.  Left Ear: External ear normal.  Nose: Nose normal.  Eyes: Right eye exhibits no discharge. Left eye exhibits no discharge.  Cardiovascular: Normal rate, regular rhythm and normal heart sounds.   Pulmonary/Chest: Effort normal and breath sounds normal.  Abdominal: Soft. There is generalized tenderness (Worse in the right lower quadrant).  Neurological: She is alert and oriented to person, place, and time.  Skin: Skin is warm and dry.    ED Course  Procedures (  including critical care time) Labs Review Labs Reviewed  COMPREHENSIVE METABOLIC PANEL - Abnormal; Notable for the following:    Albumin 3.2 (*)    GFR calc non Af Amer 84 (*)    All other components within normal limits  CBC WITH DIFFERENTIAL  LIPASE, BLOOD  URINALYSIS, ROUTINE W REFLEX MICROSCOPIC  POCT I-STAT CREATININE   Imaging Review Ct Abdomen Pelvis W Contrast  06/06/2013   CLINICAL DATA:  Right lower quadrant pain  EXAM: CT ABDOMEN AND PELVIS WITH CONTRAST  TECHNIQUE: Multidetector CT imaging of the abdomen and pelvis was performed using the standard protocol following bolus administration of intravenous contrast.  CONTRAST:  36m OMNIPAQUE IOHEXOL 300 MG/ML  SOLN  COMPARISON:  None.  FINDINGS: Lung bases are free of  acute infiltrate or sizable effusion.  The liver is diffusely decreased in attenuation consistent with fatty infiltration. The gallbladder, spleen, adrenal glands the and pancreas are within normal limits. The kidneys are well visualized bilaterally and reveal a normal enhancement pattern. A few small hypodensities which are too small for characterization are noted. No renal calculi or obstructive changes are seen.  The appendix is mildly prominent with mild. Appendiceal inflammatory changes. This may represent early acute appendicitis. Correlation with the patient's white blood cell count is recommended. The uterus is within normal limits. The bladder is well distended. No pelvic mass lesion or sidewall abnormality is noted. The osseous structures are within normal limits.  IMPRESSION: Changes suggestive of early appendicitis. Correlation with the white blood cell count is recommended.  Fatty infiltration of the liver.   Electronically Signed   By: MInez CatalinaM.D.   On: 06/06/2013 13:38    EKG Interpretation   None       MDM   1. Appendicitis    Exam and CT c/w likely appendicitis. She is well appearing otherwise and pain was controlled in ED. Surgery consulted.    SEphraim Hamburger MD 06/06/13 1501

## 2013-06-06 NOTE — ED Notes (Signed)
Pt moved to pod C, report given to nurse and pt taken.

## 2013-06-06 NOTE — ED Notes (Signed)
C/o right lower quad abd pain for two days States she does have a history of kidney and feels as if she has all the same sx but its a different location.   Patient thought she had gas so she took OTC gas medications Denies conistapatation. Did see blood in stool

## 2013-06-06 NOTE — ED Provider Notes (Signed)
CSN: [id number]     Arrival date & time 06/06/13  1517 History   First MD Initiated Contact with Patient 06/06/13 [phone]     Chief Complaint  Patient presents with  . Abdominal Pain   (Consider location/radiation/quality/duration/timing/severity/associated sxs/prior Treatment) HPI Comments: 32 year old female presents complaining of 2 days of right lower quadrant abdominal pain. In addition to the abdominal pain, she is also feeling nausea without vomiting, having loose bowel movements, and feeling generally very ill. She has a subjective fever and notes that her baseline temperature is about 93F on a normal day. The pain and other symptoms have been getting gradually worse over the past 2 days. She has a history of kidney stones and irritable bowel syndrome but she says her current condition feels different than previous episodes of IBS or nephrolithiasis. In general, she feels much sicker than she usually does with IBS, and the pain is in the wrong place to be kidney stones. Also, this pain is constant where her kidney stone pain usually comes in waves. Additionally she has noted bright red blood on the outside of her stool one time last night, but this had resolved with a bowel movement this morning. She has never experienced anything like this before. No history of abdominal surgeries. No vaginal discharge. She is not currently sexually active and has not been for the past 6 months.  She has taken over-the-counter gas medications and her probiotics which would generally help her IBS pain but this has not helped   Patient is a 32 y.o. female presenting with abdominal pain.  Abdominal Pain Associated symptoms include abdominal pain. Pertinent negatives include no chest pain and no shortness of breath.    Past Medical History  Diagnosis Date  . Hypertension    Past Surgical History  Procedure Laterality Date  . Dental surgery     History reviewed. No pertinent family history. History   Substance Use Topics  . Smoking status: Current Every Day Smoker  . Smokeless tobacco: Not on file  . Alcohol Use: No   OB History   Grav Para Term Preterm Abortions TAB SAB Ect Mult Living                 Review of Systems  Constitutional: Positive for fever, chills, appetite change (Has not been able to eat in 2 days without abdominal pain and nausea) and fatigue.  Eyes: Negative for visual disturbance.  Respiratory: Negative for cough and shortness of breath.   Cardiovascular: Negative for chest pain, palpitations and leg swelling.  Gastrointestinal: Positive for nausea, abdominal pain, diarrhea (loose stools, not actually diarrhea) and anal bleeding. Negative for vomiting.  Endocrine: Negative for polydipsia and polyuria.  Genitourinary: Positive for flank pain (Bilateral flank/lower back pain, crampy). Negative for dysuria, urgency, frequency and vaginal discharge.  Musculoskeletal: Positive for back pain. Negative for arthralgias and myalgias.  Skin: Negative for rash.  Neurological: Negative for dizziness, weakness and light-headedness.    Allergies  Codeine  Home Medications   Current Outpatient Rx  Name  Route  Sig  Dispense  Refill  . Levonorgestrel-Ethinyl Estradiol (AMETHIA,CAMRESE) 0.15-0.03 &0.01 MG tablet   Oral   Take 1 tablet by mouth daily.         . Prenatal Vit-Fe Sulfate-FA (PRENATAL VITAMIN PO)   Oral   Take by mouth.         . Probiotic Product (PROBIOTIC DAILY PO)   Oral   Take 500 mg by mouth.         Marland Kitchen  aspirin 325 MG tablet   Oral   Take 975 mg by mouth every 6 (six) hours as needed. For pain         . Ibuprofen (ADVIL MIGRAINE) 200 MG CAPS   Oral   Take 400 mg by mouth 2 (two) times daily as needed. For pain         . oxyCODONE-acetaminophen (PERCOCET/ROXICET) 5-325 MG per tablet   Oral   Take 1 tablet by mouth every 4 (four) hours as needed for pain.   30 tablet   0    BP 132/92  Pulse 90  Temp(Src) 98.1 F (36.7 C)  (Oral)  Resp 18  SpO2 100% Physical Exam  Nursing note and vitals reviewed. Constitutional: She is oriented to person, place, and time. Vital signs are normal. She appears well-developed and well-nourished. She appears ill. No distress.  HENT:  Head: Normocephalic and atraumatic.  Eyes: Conjunctivae and EOM are normal. Pupils are equal, round, and reactive to light. Right eye exhibits no discharge. Left eye exhibits no discharge. No scleral icterus.  Neck: Normal range of motion. Neck supple.  Cardiovascular: Normal rate, regular rhythm and normal heart sounds.  Exam reveals no gallop and no friction rub.   No murmur heard. Pulmonary/Chest: Effort normal and breath sounds normal. No respiratory distress. She has no wheezes. She has no rales.  Abdominal: Soft. Normal appearance. There is tenderness (Palpation of the right upper quadrant and left lower quadrant causes pain in the right lower quadrant, but the right lower quadrant tenderness is the most severe pain) in the right upper quadrant, right lower quadrant and left lower quadrant. There is guarding (minimal) and tenderness at McBurney's point. There is no rigidity, no rebound, no CVA tenderness and negative Murphy's sign.  Neurological: She is alert and oriented to person, place, and time. She has normal strength. Coordination normal.  Skin: Skin is warm and dry. No rash noted. She is not diaphoretic.  Psychiatric: She has a normal mood and affect. Judgment normal.    ED Course  Procedures (including critical care time) Labs Review Labs Reviewed  POCT URINALYSIS DIP (DEVICE) - Abnormal; Notable for the following:    Ketones, ur TRACE (*)    Protein, ur 30 (*)    Leukocytes, UA TRACE (*)    All other components within normal limits  POCT PREGNANCY, URINE   Imaging Review No results found.    MDM   1. Right lower quadrant abdominal pain    I feel this patient would benefit from a CT scan to rule out acute  abdomen-appendicitis. Discussed my concerns with the patient, she is in agreement. She is being transferred to the emergency department via shuttle.  Urine culture sent and urine sent down for microscopy     Liam Graham, PA-C 06/06/13 [phone]

## 2013-06-06 NOTE — Progress Notes (Signed)
Care of pt assumed by MA [name] Detjen RN 

## 2013-06-06 NOTE — H&P (Signed)
Stephanie Miles 1981-02-01  [id number].   Primary Care MD: none currently, used to be Dr. Lavone Orn Chief Complaint/Reason for Consult: acute appendicitis HPI: This is a 32 yo female with HTN and IBS who began having some abdominal discomfort on Tuesday night.  Her pain persisted.  She has had some diarrhea as well nausea.  She denies any fevers.  Her pain persisted and she decided to come to the [facility] for evaluation.  She does not have a WBC, but has a CT scan that reveals acute appendicitis.  We have been asked to see for further evaluation  ROS: Please see HPI, otherwise all other systems are negative  History reviewed. No pertinent family history.  Past Medical History  Diagnosis Date  . Hypertension   . GERD (gastroesophageal reflux disease)   . IBS (irritable bowel syndrome)     Past Surgical History  Procedure Laterality Date  . Dental surgery      Social History:  reports that she quit smoking about 3 months ago. She does not have any smokeless tobacco history on file. She reports that she drinks alcohol. She reports that she does not use illicit drugs.  Allergies:  Allergies  Allergen Reactions  . Codeine Nausea Only     (Not in a hospital admission)  Blood pressure 158/104, pulse 92, temperature 97.9 F (36.6 C), temperature source Oral, resp. rate 18, weight 211 lb 3 oz (95.794 kg), SpO2 97.00%. Physical Exam: General: pleasant, obese white female who is laying in bed in NAD HEENT: head is normocephalic, atraumatic.  Sclera are noninjected.  PERRL.  Ears and nose without any masses or lesions.  Mouth is pink and moist Heart: regular, rate, and rhythm.  Normal s1,s2. No obvious murmurs, gallops, or rubs noted.  Palpable radial and pedal pulses bilaterally Lungs: CTAB, no wheezes, rhonchi, or rales noted.  Respiratory effort nonlabored Abd: soft, mild diffuse tenderness, but greatest over McBurney's point, ND, +BS, no masses, hernias, or organomegaly MS: all 4  extremities are symmetrical with no cyanosis, clubbing, or edema. Skin: warm and dry with no masses, lesions, or rashes Psych: A&Ox3 with an appropriate affect.    Results for orders placed during the hospital encounter of 06/06/13 (from the past 48 hour(s))  CBC WITH DIFFERENTIAL     Status: None   Collection Time    06/06/13 10:08 AM      Result Value Range   WBC 10.1  4.0 - 10.5 K/uL   RBC 4.23  3.87 - 5.11 MIL/uL   Hemoglobin 12.6  12.0 - 15.0 g/dL   HCT 36.8  36.0 - 46.0 %   MCV 87.0  78.0 - 100.0 fL   MCH 29.8  26.0 - 34.0 pg   MCHC 34.2  30.0 - 36.0 g/dL   RDW 13.2  11.5 - 15.5 %   Platelets 283  150 - 400 K/uL   Neutrophils Relative % 57  43 - 77 %   Neutro Abs 5.8  1.7 - 7.7 K/uL   Lymphocytes Relative 33  12 - 46 %   Lymphs Abs 3.4  0.7 - 4.0 K/uL   Monocytes Relative 7  3 - 12 %   Monocytes Absolute 0.7  0.1 - 1.0 K/uL   Eosinophils Relative 3  0 - 5 %   Eosinophils Absolute 0.3  0.0 - 0.7 K/uL   Basophils Relative 0  0 - 1 %   Basophils Absolute 0.0  0.0 - 0.1 K/uL  COMPREHENSIVE METABOLIC PANEL  Status: Abnormal   Collection Time    06/06/13 10:08 AM      Result Value Range   Sodium 138  135 - 145 mEq/L   Potassium 4.0  3.5 - 5.1 mEq/L   Chloride 104  96 - 112 mEq/L   CO2 24  19 - 32 mEq/L   Glucose, Bld 97  70 - 99 mg/dL   BUN 14  6 - 23 mg/dL   Creatinine, Ser 0.89  0.50 - 1.10 mg/dL   Calcium 8.6  8.4 - 10.5 mg/dL   Total Protein 6.4  6.0 - 8.3 g/dL   Albumin 3.2 (*) 3.5 - 5.2 g/dL   AST 21  0 - 37 U/L   ALT 29  0 - 35 U/L   Alkaline Phosphatase 48  39 - 117 U/L   Total Bilirubin 0.3  0.3 - 1.2 mg/dL   GFR calc non Af Amer 84 (*) >90 mL/min   GFR calc Af Amer >90  >90 mL/min   Comment: (NOTE)     The eGFR has been calculated using the CKD EPI equation.     This calculation has not been validated in all clinical situations.     eGFR's persistently <90 mL/min signify possible Chronic Kidney     Disease.  LIPASE, BLOOD     Status: None    Collection Time    06/06/13 10:08 AM      Result Value Range   Lipase 17  11 - 59 U/L  POCT I-STAT CREATININE     Status: None   Collection Time    06/06/13 10:40 AM      Result Value Range   Creatinine, Ser 1.10  0.50 - 1.10 mg/dL   Ct Abdomen Pelvis W Contrast  06/06/2013   CLINICAL DATA:  Right lower quadrant pain  EXAM: CT ABDOMEN AND PELVIS WITH CONTRAST  TECHNIQUE: Multidetector CT imaging of the abdomen and pelvis was performed using the standard protocol following bolus administration of intravenous contrast.  CONTRAST:  73m OMNIPAQUE IOHEXOL 300 MG/ML  SOLN  COMPARISON:  None.  FINDINGS: Lung bases are free of acute infiltrate or sizable effusion.  The liver is diffusely decreased in attenuation consistent with fatty infiltration. The gallbladder, spleen, adrenal glands the and pancreas are within normal limits. The kidneys are well visualized bilaterally and reveal a normal enhancement pattern. A few small hypodensities which are too small for characterization are noted. No renal calculi or obstructive changes are seen.  The appendix is mildly prominent with mild. Appendiceal inflammatory changes. This may represent early acute appendicitis. Correlation with the patient's white blood cell count is recommended. The uterus is within normal limits. The bladder is well distended. No pelvic mass lesion or sidewall abnormality is noted. The osseous structures are within normal limits.  IMPRESSION: Changes suggestive of early appendicitis. Correlation with the white blood cell count is recommended.  Fatty infiltration of the liver.   Electronically Signed   By: MInez CatalinaM.D.   On: 06/06/2013 13:38       Assessment/Plan 1. Acute appendicitis 2. HTN 3. IBS  Plan: 1. We will admit the patient and plan for lap appy tonight.  She will be started on Rocephin and Flagyl.  The procedure including risks, complication, and outcome were d/w the patient.  She understands and is agreeable to  proceed.  Mauria Asquith E 06/06/2013, 3:44 PM Pager: [phone]

## 2013-06-06 NOTE — ED Provider Notes (Signed)
Medical screening examination/treatment/procedure(s) were performed by non-physician practitioner and as supervising physician I was immediately available for consultation/collaboration.  Philipp Deputy, M.D.  Harden Mo, MD 06/06/13 1007

## 2013-06-06 NOTE — ED Notes (Signed)
Pt sent here from ucc, having RLQ pain x 2 days, reports diarrhea, no vomiting or urinary symptoms. No acute distress noted at triage.

## 2013-06-06 NOTE — Preoperative (Signed)
Beta Blockers   Reason not to administer Beta Blockers:Not Applicable 

## 2013-06-06 NOTE — Op Note (Signed)
Appendectomy, Lap, Procedure Note  Indications: The patient presented with a history of right-sided abdominal pain. A CT scan revealed findings consistent with acute appendicitis.  Pre-operative Diagnosis: Acute appendicitis without mention of peritonitis  Post-operative Diagnosis: Same  Surgeon: Lorie Cleckley K.   Assistants: none  Anesthesia: General endotracheal anesthesia  ASA Class: 2E  Procedure Details  The patient was seen again in the Holding Room. The risks, benefits, complications, treatment options, and expected outcomes were discussed with the patient and/or family. The possibilities of reaction to medication, perforation of viscus, bleeding, recurrent infection, finding a normal appendix, the need for additional procedures, failure to diagnose a condition, and creating a complication requiring transfusion or operation were discussed. There was concurrence with the proposed plan and informed consent was obtained. The site of surgery was properly noted. The patient was taken to Operating Room, identified as Stephanie Miles and the procedure verified as Appendectomy. A Time Out was held and the above information confirmed.  The patient was placed in the supine position and general anesthesia was induced.  The abdomen was prepped and draped in a sterile fashion. A one centimeter infraumbilical incision was made.  Dissection was carried down to the fascia bluntly.  The fascia was incised vertically.  We entered the peritoneal cavity bluntly.  A pursestring suture was passed around the incision with a 0 Vicryl.  The Hasson cannula was introduced into the abdomen and the tails of the suture were used to hold the Hasson in place.   The pneumoperitoneum was then established maintaining a maximum pressure of 15 mmHg.  Additional 5 mm cannulas then placed in the left lower quadrant of the abdomen and the right upper quadrant under direct visualization. A careful evaluation of the entire  abdomen was carried out. The patient was placed in Trendelenburg and left lateral decubitus position.  The scope was moved to the right upper quadrant port site. The cecum was mobilized medially.  The appendix was easily identified and was inflamed, but no perforated.  The appendix was carefully dissected. The appendix was was skeletonized with the harmonic scalpel.   The appendix was divided at its base using an endo-GIA stapler. Minimal appendiceal stump was left in place. There was no evidence of bleeding, leakage, or complication after division of the appendix.  The umbilical port site was closed with the purse string suture. There was no residual palpable fascial defect.  The trocar site skin wounds were closed with 4-0 Monocryl.  Instrument, sponge, and needle counts were correct at the conclusion of the case.   Findings: The appendix was found to be inflamed. There were not signs of necrosis.  There was not perforation. There was not abscess formation.  Estimated Blood Loss:  Minimal         Drains: none         Specimens: appendix         Complications:  None; patient tolerated the procedure well.         Disposition: PACU - hemodynamically stable.         Condition: stable  Stephanie Miles. Georgette Dover, MD, [facility]  General/ Trauma Surgery  06/06/2013 6:41 PM

## 2013-06-06 NOTE — Anesthesia Preprocedure Evaluation (Addendum)
Anesthesia Evaluation  Patient identified by MRN, date of birth, ID band Patient awake    Reviewed: Allergy & Precautions, H&P , NPO status , Patient's Chart, lab work & pertinent test results  Airway Mallampati: II TM Distance: >3 FB Neck ROM: Full    Dental  (+) Teeth Intact and Dental Advisory Given   Pulmonary sleep apnea and Continuous Positive Airway Pressure Ventilation , former smoker,  breath sounds clear to auscultation        Cardiovascular hypertension, Rhythm:Regular Rate:Normal     Neuro/Psych    GI/Hepatic GERD-  Controlled,Acute appendicitis   Endo/Other    Renal/GU      Musculoskeletal   Abdominal (+) + obese,  Abdomen: soft. Bowel sounds: normal.  Peds  Hematology   Anesthesia Other Findings   Reproductive/Obstetrics                        Anesthesia Physical Anesthesia Plan  ASA: II and emergent  Anesthesia Plan: General   Post-op Pain Management:    Induction: Intravenous, Rapid sequence and Cricoid pressure planned  Airway Management Planned: Oral ETT  Additional Equipment:   Intra-op Plan:   Post-operative Plan: Extubation in OR  Informed Consent: I have reviewed the patients History and Physical, chart, labs and discussed the procedure including the risks, benefits and alternatives for the proposed anesthesia with the patient or authorized representative who has indicated his/her understanding and acceptance.     Plan Discussed with: CRNA and Surgeon  Anesthesia Plan Comments:         Anesthesia Quick Evaluation

## 2013-06-06 NOTE — H&P (Signed)
Agree with above. Proceed with laparoscopic appendectomy.  The surgical procedure has been discussed with the patient.  Potential risks, benefits, alternative treatments, and expected outcomes have been explained.  All of the patient's questions at this time have been answered.  The likelihood of reaching the patient's treatment goal is good.  The patient understand the proposed surgical procedure and wishes to proceed.   Stephanie Miles. Georgette Dover, MD, [facility]  General/ Trauma Surgery  06/06/2013 4:26 PM

## 2013-06-06 NOTE — Anesthesia Postprocedure Evaluation (Signed)
  Anesthesia Post-op Note  Patient: Stephanie Miles  Procedure(s) Performed: Procedure(s): APPENDECTOMY LAPAROSCOPIC (N/A)  Patient Location: PACU  Anesthesia Type:General  Level of Consciousness: awake, alert , oriented and patient cooperative  Airway and Oxygen Therapy: Patient Spontanous Breathing  Post-op Pain: mild  Post-op Assessment: Post-op Vital signs reviewed, Patient's Cardiovascular Status Stable, Respiratory Function Stable, Patent Airway, No signs of Nausea or vomiting and Pain level controlled  Post-op Vital Signs: stable  Complications: No apparent anesthesia complications

## 2013-06-06 NOTE — Transfer of Care (Signed)
Immediate Anesthesia Transfer of Care Note  Patient: Stephanie Miles  Procedure(s) Performed: Procedure(s): APPENDECTOMY LAPAROSCOPIC (N/A)  Patient Location: PACU  Anesthesia Type:General  Level of Consciousness: awake, alert  and oriented  Airway & Oxygen Therapy: Patient connected to nasal cannula oxygen  Post-op Assessment: Report given to PACU RN  Post vital signs: stable  Complications: No apparent anesthesia complications

## 2013-06-07 LAB — URINE CULTURE

## 2013-06-07 MED ORDER — INFLUENZA VAC SPLIT QUAD 0.5 ML IM SUSP: 0.5000 mL | INTRAMUSCULAR | Status: DC

## 2013-06-07 MED ORDER — PROMETHAZINE HCL 12.5 MG PO TABS: 25.0000 mg | ORAL_TABLET | Freq: Four times a day (QID) | ORAL | Status: DC | PRN

## 2013-06-07 MED ORDER — INFLUENZA VAC SPLIT QUAD 0.5 ML IM SUSP
0.5000 mL | INTRAMUSCULAR | Status: AC
  Administered 2013-06-07: 0.5 mL via INTRAMUSCULAR
  Filled 2013-06-07: qty 0.5

## 2013-06-07 MED ORDER — OXYCODONE-ACETAMINOPHEN 5-325 MG PO TABS: 1.0000 | ORAL_TABLET | ORAL | Status: DC | PRN

## 2013-06-07 MED ORDER — ACETAMINOPHEN 325 MG PO TABS: 650.0000 mg | ORAL_TABLET | Freq: Four times a day (QID) | ORAL | Status: DC | PRN

## 2013-06-07 MED ORDER — IBUPROFEN 600 MG PO TABS: 600.0000 mg | ORAL_TABLET | Freq: Three times a day (TID) | ORAL | Status: DC | PRN

## 2013-06-07 NOTE — Care Management Note (Signed)
  Page 1 of 1   06/07/2013     9:05:44 AM   CARE MANAGEMENT NOTE 06/07/2013  Patient:  Stephanie Miles,Stephanie Miles   Account Number:  [id number]  Date Initiated:  06/07/2013  Documentation initiated by:  Magdalen Spatz  Subjective/Objective Assessment:     Action/Plan:   Anticipated DC Date:     Anticipated DC Plan:           Choice offered to / List presented to:             Status of service:   Medicare Important Message given?   (If response is "NO", the following Medicare IM given date fields will be blank) Date Medicare IM given:   Date Additional Medicare IM given:    Discharge Disposition:    Per UR Regulation:    If discussed at Long Length of Stay Meetings, dates discussed:    Comments:  06-07-13 Patient wanting to know where she can go to use her orange she received at [facility] and [facility] . Instructed patient to go to pharmacy at  [facility] and [facility] . Magdalen Spatz RN BSN [phone]

## 2013-06-07 NOTE — Discharge Summary (Signed)
Physician Discharge Summary  Patient ID: Stephanie Miles MRN: [id number] DOB/AGE: 02/08/1981 32 y.o.  Admit date: 06/06/2013 Discharge date: 06/07/2013  Admitting Diagnosis: Acute Appendicitis  Discharge Diagnosis Patient Active Problem List   Diagnosis Date Noted  . Acute appendicitis 06/06/2013  . IBS (irritable bowel syndrome) 06/06/2013  . GERD (gastroesophageal reflux disease) 06/06/2013  . HTN (hypertension) 06/06/2013    Consultants none  Imaging: Ct Abdomen Pelvis W Contrast  06/06/2013   CLINICAL DATA:  Right lower quadrant pain  EXAM: CT ABDOMEN AND PELVIS WITH CONTRAST  TECHNIQUE: Multidetector CT imaging of the abdomen and pelvis was performed using the standard protocol following bolus administration of intravenous contrast.  CONTRAST:  30m OMNIPAQUE IOHEXOL 300 MG/ML  SOLN  COMPARISON:  None.  FINDINGS: Lung bases are free of acute infiltrate or sizable effusion.  The liver is diffusely decreased in attenuation consistent with fatty infiltration. The gallbladder, spleen, adrenal glands the and pancreas are within normal limits. The kidneys are well visualized bilaterally and reveal a normal enhancement pattern. A few small hypodensities which are too small for characterization are noted. No renal calculi or obstructive changes are seen.  The appendix is mildly prominent with mild. Appendiceal inflammatory changes. This may represent early acute appendicitis. Correlation with the patient's white blood cell count is recommended. The uterus is within normal limits. The bladder is well distended. No pelvic mass lesion or sidewall abnormality is noted. The osseous structures are within normal limits.  IMPRESSION: Changes suggestive of early appendicitis. Correlation with the white blood cell count is recommended.  Fatty infiltration of the liver.   Electronically Signed   By: MInez CatalinaM.D.   On: 06/06/2013 13:38    Procedures Laparoscopic Appendectomy(Dr. TGeorgette Dover 06/06/13)  Hospital Course:  Stephanie Miles a 32year old female with a history of HTN and IBS [facility].  Workup showed acute appendicitis.  Patient was admitted and underwent procedure listed above.  Tolerated procedure well and was transferred to the floor.  Diet was advanced as tolerated.  On POD #1, the patient was voiding well, tolerating diet, ambulating well, pain well controlled, vital signs stable, incisions c/d/i and felt stable for discharge home.  Patient will follow up in our office in 3 weeks and knows to call with questions or concerns.  Physical Exam: General:  Alert, NAD, pleasant, comfortable Abd:  Soft, ND, mild tenderness, incisions C/D/I    Medication List    STOP taking these medications       naproxen sodium 220 MG tablet  Commonly known as:  ANAPROX      TAKE these medications       acetaminophen 325 MG tablet  Commonly known as:  TYLENOL  Take 2 tablets (650 mg total) by mouth every 6 (six) hours as needed for mild pain (or Temp > 100).     ibuprofen 600 MG tablet  Commonly known as:  ADVIL  Take 1 tablet (600 mg total) by mouth 3 (three) times daily with meals as needed.     Levonorgestrel-Ethinyl Estradiol 0.15-0.03 &0.01 MG tablet  Commonly known as:  AMETHIA,CAMRESE  Take 1 tablet by mouth daily.     omeprazole 20 MG capsule  Commonly known as:  PRILOSEC  Take 20 mg by mouth daily.     oxyCODONE-acetaminophen 5-325 MG per tablet  Commonly known as:  PERCOCET/ROXICET  Take 1 tablet by mouth every 4 (four) hours as needed for moderate pain.  PRENATAL VITAMIN PO  Take 1 tablet by mouth daily.     PROBIOTIC DAILY PO  Take 500 mg by mouth.     promethazine 12.5 MG tablet  Commonly known as:  PHENERGAN  Take 2 tablets (25 mg total) by mouth every 6 (six) hours as needed for nausea.             Follow-up Information   Follow up with [facility] On 07/02/2013. (arrive no later than  1:15PM)    Contact information:   [address]   [place] [id number] [phone]       Signed: Erby Pian, [facility] [phone]  06/07/2013, 8:21 AM

## 2013-06-07 NOTE — Discharge Summary (Signed)
DISCHARGE

## 2013-06-07 NOTE — Discharge Planning (Signed)
Copy of home instructions and rx to client who verbalizes understanding. Dc per w/c with all personal belongings to private car home, acomp. By Mom.

## 2013-06-10 ENCOUNTER — Encounter (HOSPITAL_COMMUNITY): Payer: Self-pay | Admitting: Surgery

## 2013-07-02 ENCOUNTER — Encounter (INDEPENDENT_AMBULATORY_CARE_PROVIDER_SITE_OTHER): Payer: Self-pay

## 2013-07-02 ENCOUNTER — Ambulatory Visit (INDEPENDENT_AMBULATORY_CARE_PROVIDER_SITE_OTHER): Payer: PRIVATE HEALTH INSURANCE | Admitting: General Surgery

## 2013-07-02 VITALS — BP 127/80 | HR 76 | Temp 98.6°F | Resp 14 | Ht 66.0 in | Wt 217.8 lb

## 2013-07-02 DIAGNOSIS — K3580 Unspecified acute appendicitis: Secondary | ICD-10-CM

## 2013-07-02 NOTE — Progress Notes (Signed)
Stephanie Miles Jul 12, 1981 [id number] 07/02/2013   History of Present Illness: Stephanie Miles is a  32 y.o. female who presents today status post lap appy by Dr. Georgette Dover .  Pathology reveals acute appendicitis.  The patient is tolerating a regular diet, having normal bowel movements, has good pain control.  She  is back to most normal activities.   Physical Exam: Abd: soft, nontender, active bowel sounds, nondistended.  All incisions are well healed.  Filed Vitals:   07/02/13 1546  BP: 127/80  Pulse: 76  Temp: 98.6 F (37 C)  Resp: 14    Impression: 1.  Acute appendicitis, s/p lap appy  Plan: She  is able to return to normal activities. She  may follow up on a prn basis.

## 2013-07-02 NOTE — Patient Instructions (Signed)
You may resume normal activities.  Follow up as needed.

## 2013-07-08 ENCOUNTER — Encounter: Payer: Self-pay | Admitting: Internal Medicine

## 2013-07-08 ENCOUNTER — Encounter (HOSPITAL_COMMUNITY): Payer: Self-pay | Admitting: Emergency Medicine

## 2013-07-08 ENCOUNTER — Ambulatory Visit: Payer: PRIVATE HEALTH INSURANCE | Attending: Internal Medicine | Admitting: Internal Medicine

## 2013-07-08 ENCOUNTER — Inpatient Hospital Stay (HOSPITAL_COMMUNITY)
Admission: EM | Admit: 2013-07-08 | Discharge: 2013-07-10 | DRG: 392 | Disposition: A | Discharge status: Home / Self Care | Payer: PRIVATE HEALTH INSURANCE | Attending: Internal Medicine | Admitting: Internal Medicine

## 2013-07-08 ENCOUNTER — Emergency Department (HOSPITAL_COMMUNITY): Payer: PRIVATE HEALTH INSURANCE

## 2013-07-08 VITALS — BP 139/102 | HR 115 | Temp 98.7°F | Resp 14 | Ht 66.0 in | Wt 220.4 lb

## 2013-07-08 DIAGNOSIS — G4730 Sleep apnea, unspecified: Secondary | ICD-10-CM

## 2013-07-08 DIAGNOSIS — K3580 Unspecified acute appendicitis: Secondary | ICD-10-CM

## 2013-07-08 DIAGNOSIS — K589 Irritable bowel syndrome without diarrhea: Secondary | ICD-10-CM

## 2013-07-08 DIAGNOSIS — Z833 Family history of diabetes mellitus: Secondary | ICD-10-CM

## 2013-07-08 DIAGNOSIS — R112 Nausea with vomiting, unspecified: Secondary | ICD-10-CM

## 2013-07-08 DIAGNOSIS — A088 Other specified intestinal infections: Principal | ICD-10-CM | POA: Diagnosis present

## 2013-07-08 DIAGNOSIS — G43909 Migraine, unspecified, not intractable, without status migrainosus: Secondary | ICD-10-CM

## 2013-07-08 DIAGNOSIS — Z0000 Encounter for general adult medical examination without abnormal findings: Secondary | ICD-10-CM

## 2013-07-08 DIAGNOSIS — Z886 Allergy status to analgesic agent status: Secondary | ICD-10-CM

## 2013-07-08 DIAGNOSIS — Z87891 Personal history of nicotine dependence: Secondary | ICD-10-CM

## 2013-07-08 DIAGNOSIS — K567 Ileus, unspecified: Secondary | ICD-10-CM | POA: Diagnosis present

## 2013-07-08 DIAGNOSIS — Z8249 Family history of ischemic heart disease and other diseases of the circulatory system: Secondary | ICD-10-CM

## 2013-07-08 DIAGNOSIS — K219 Gastro-esophageal reflux disease without esophagitis: Secondary | ICD-10-CM

## 2013-07-08 DIAGNOSIS — I10 Essential (primary) hypertension: Secondary | ICD-10-CM

## 2013-07-08 DIAGNOSIS — R197 Diarrhea, unspecified: Secondary | ICD-10-CM

## 2013-07-08 DIAGNOSIS — E876 Hypokalemia: Secondary | ICD-10-CM | POA: Diagnosis present

## 2013-07-08 DIAGNOSIS — N390 Urinary tract infection, site not specified: Secondary | ICD-10-CM

## 2013-07-08 DIAGNOSIS — K560 Paralytic ileus: Secondary | ICD-10-CM | POA: Diagnosis present

## 2013-07-08 DIAGNOSIS — E860 Dehydration: Secondary | ICD-10-CM

## 2013-07-08 DIAGNOSIS — R109 Unspecified abdominal pain: Secondary | ICD-10-CM

## 2013-07-08 HISTORY — DX: Migraine, unspecified, not intractable, without status migrainosus: G43.909

## 2013-07-08 LAB — CBC WITH DIFFERENTIAL/PLATELET
Basophils Relative: 0 % (ref 0–1)
HCT: 42.3 % (ref 36.0–46.0)
Hemoglobin: 14.4 g/dL (ref 12.0–15.0)
Lymphs Abs: 2.1 10*3/uL (ref 0.7–4.0)
MCHC: 34 g/dL (ref 30.0–36.0)
MCV: 86 fL (ref 78.0–100.0)
Monocytes Absolute: 0.8 10*3/uL (ref 0.1–1.0)
Monocytes Relative: 5 % (ref 3–12)
Neutro Abs: 13.7 10*3/uL — ABNORMAL HIGH (ref 1.7–7.7)
Neutrophils Relative %: 81 % — ABNORMAL HIGH (ref 43–77)

## 2013-07-08 LAB — COMPREHENSIVE METABOLIC PANEL
Albumin: 3.8 g/dL (ref 3.5–5.2)
Alkaline Phosphatase: 54 U/L (ref 39–117)
BUN: 15 mg/dL (ref 6–23)
CO2: 20 mEq/L (ref 19–32)
Chloride: 104 mEq/L (ref 96–112)
Creatinine, Ser: 0.84 mg/dL (ref 0.50–1.10)
GFR calc non Af Amer: 90 mL/min — ABNORMAL LOW (ref >90)
Glucose, Bld: 107 mg/dL — ABNORMAL HIGH (ref 70–99)
Total Bilirubin: 0.3 mg/dL (ref 0.3–1.2)

## 2013-07-08 LAB — LIPASE, BLOOD: Lipase: 38 U/L (ref 11–59)

## 2013-07-08 LAB — URINALYSIS, ROUTINE W REFLEX MICROSCOPIC
Ketones, ur: NEGATIVE mg/dL
Nitrite: NEGATIVE
Protein, ur: 30 mg/dL — AB
Urobilinogen, UA: 0.2 mg/dL (ref 0.0–1.0)

## 2013-07-08 LAB — URINE MICROSCOPIC-ADD ON

## 2013-07-08 MED ORDER — SUCRALFATE 1 GM/10ML PO SUSP
1.0000 g | Freq: Three times a day (TID) | ORAL | Status: DC
  Administered 2013-07-08 – 2013-07-10 (×7): 1 g via ORAL
  Filled 2013-07-08 (×10): qty 10

## 2013-07-08 MED ORDER — ONDANSETRON HCL 4 MG/2ML IJ SOLN
4.0000 mg | Freq: Four times a day (QID) | INTRAMUSCULAR | Status: DC | PRN
  Administered 2013-07-08 – 2013-07-09 (×2): 4 mg via INTRAVENOUS
  Filled 2013-07-08 (×2): qty 2

## 2013-07-08 MED ORDER — IOHEXOL 300 MG/ML  SOLN: 25.0000 mL | INTRAMUSCULAR | Status: AC

## 2013-07-08 MED ORDER — PROBIOTIC DAILY PO CAPS: 1.0000 | ORAL_CAPSULE | Freq: Every day | ORAL | Status: DC

## 2013-07-08 MED ORDER — NORGESTIMATE-ETH ESTRADIOL 0.25-35 MG-MCG PO TABS
1.0000 | ORAL_TABLET | Freq: Every evening | ORAL | Status: DC
  Administered 2013-07-08 – 2013-07-09 (×2): 1 via ORAL

## 2013-07-08 MED ORDER — DIPHENHYDRAMINE HCL 50 MG/ML IJ SOLN
50.0000 mg | Freq: Once | INTRAMUSCULAR | Status: AC
  Administered 2013-07-08: 50 mg via INTRAVENOUS
  Filled 2013-07-08: qty 1

## 2013-07-08 MED ORDER — PANTOPRAZOLE SODIUM 40 MG IV SOLR
40.0000 mg | Freq: Every day | INTRAVENOUS | Status: DC
  Administered 2013-07-08 – 2013-07-09 (×2): 40 mg via INTRAVENOUS
  Filled 2013-07-08 (×3): qty 40

## 2013-07-08 MED ORDER — ONDANSETRON HCL 4 MG/2ML IJ SOLN: 4.0000 mg | Freq: Three times a day (TID) | INTRAMUSCULAR | Status: AC | PRN

## 2013-07-08 MED ORDER — HYDROCHLOROTHIAZIDE 12.5 MG PO TABS: 12.5000 mg | ORAL_TABLET | Freq: Every day | ORAL | Status: DC

## 2013-07-08 MED ORDER — PROMETHAZINE HCL 12.5 MG PO TABS: 25.0000 mg | ORAL_TABLET | Freq: Four times a day (QID) | ORAL | Status: DC | PRN

## 2013-07-08 MED ORDER — METRONIDAZOLE 500 MG PO TABS: 500.0000 mg | ORAL_TABLET | Freq: Three times a day (TID) | ORAL | Status: DC

## 2013-07-08 MED ORDER — DIPHENOXYLATE-ATROPINE 2.5-0.025 MG PO TABS: 1.0000 | ORAL_TABLET | Freq: Four times a day (QID) | ORAL | Status: DC | PRN

## 2013-07-08 MED ORDER — FAMOTIDINE IN NACL 20-0.9 MG/50ML-% IV SOLN
20.0000 mg | Freq: Once | INTRAVENOUS | Status: AC
  Administered 2013-07-08: 20 mg via INTRAVENOUS
  Filled 2013-07-08: qty 50

## 2013-07-08 MED ORDER — KETOROLAC TROMETHAMINE 15 MG/ML IJ SOLN: 15.0000 mg | Freq: Three times a day (TID) | INTRAMUSCULAR | Status: DC | PRN

## 2013-07-08 MED ORDER — PROMETHAZINE HCL 25 MG/ML IJ SOLN
12.5000 mg | Freq: Once | INTRAMUSCULAR | Status: AC
  Administered 2013-07-08: 12.5 mg via INTRAVENOUS
  Filled 2013-07-08: qty 1

## 2013-07-08 MED ORDER — ACETAMINOPHEN 325 MG PO TABS: 650.0000 mg | ORAL_TABLET | Freq: Four times a day (QID) | ORAL | Status: DC | PRN

## 2013-07-08 MED ORDER — ASPIRIN-ACETAMINOPHEN-CAFFEINE 250-250-65 MG PO TABS: 1.0000 | ORAL_TABLET | Freq: Four times a day (QID) | ORAL | Status: DC | PRN

## 2013-07-08 MED ORDER — SODIUM CHLORIDE 0.9 % IV SOLN
INTRAVENOUS | Status: DC
  Administered 2013-07-08 – 2013-07-09 (×4): via INTRAVENOUS

## 2013-07-08 MED ORDER — DICYCLOMINE HCL 10 MG PO CAPS: 10.0000 mg | ORAL_CAPSULE | Freq: Two times a day (BID) | ORAL | Status: DC | PRN

## 2013-07-08 MED ORDER — NORGESTIMATE-ETH ESTRADIOL 0.25-35 MG-MCG PO TABS: 1.0000 | ORAL_TABLET | Freq: Every day | ORAL | Status: DC

## 2013-07-08 MED ORDER — SODIUM CHLORIDE 0.9 % IV BOLUS (SEPSIS)
1000.0000 mL | Freq: Once | INTRAVENOUS | Status: AC
  Administered 2013-07-08: 1000 mL via INTRAVENOUS

## 2013-07-08 MED ORDER — ONDANSETRON HCL 4 MG PO TABS: 4.0000 mg | ORAL_TABLET | Freq: Four times a day (QID) | ORAL | Status: DC | PRN

## 2013-07-08 MED ORDER — DEXTROSE 5 % IV SOLN
1.0000 g | INTRAVENOUS | Status: DC
  Administered 2013-07-08: 1 g via INTRAVENOUS
  Filled 2013-07-08 (×2): qty 10

## 2013-07-08 MED ORDER — MORPHINE SULFATE 2 MG/ML IJ SOLN
2.0000 mg | INTRAMUSCULAR | Status: DC | PRN
  Administered 2013-07-09: 2 mg via INTRAVENOUS
  Filled 2013-07-08: qty 1

## 2013-07-08 MED ORDER — ONDANSETRON 4 MG PO TBDP
4.0000 mg | ORAL_TABLET | Freq: Once | ORAL | Status: AC
  Administered 2013-07-08: 4 mg via ORAL

## 2013-07-08 MED ORDER — KETOROLAC TROMETHAMINE 30 MG/ML IJ SOLN
30.0000 mg | Freq: Once | INTRAMUSCULAR | Status: AC
  Administered 2013-07-08: 30 mg via INTRAVENOUS
  Filled 2013-07-08: qty 1

## 2013-07-08 MED ORDER — ASPIRIN-ACETAMINOPHEN-CAFFEINE 250-250-65 MG PO TABS
1.0000 | ORAL_TABLET | Freq: Four times a day (QID) | ORAL | Status: DC | PRN
  Administered 2013-07-08 – 2013-07-10 (×4): 1 via ORAL
  Filled 2013-07-08 (×2): qty 2

## 2013-07-08 MED ORDER — METRONIDAZOLE 500 MG PO TABS
500.0000 mg | ORAL_TABLET | Freq: Three times a day (TID) | ORAL | Status: DC
  Administered 2013-07-08: 500 mg via ORAL
  Filled 2013-07-08 (×4): qty 1

## 2013-07-08 MED ORDER — TRAMADOL HCL 50 MG PO TABS
100.0000 mg | ORAL_TABLET | Freq: Four times a day (QID) | ORAL | Status: DC | PRN
  Administered 2013-07-09: 100 mg via ORAL
  Filled 2013-07-08: qty 2

## 2013-07-08 MED ORDER — FENTANYL CITRATE 0.05 MG/ML IJ SOLN
100.0000 ug | Freq: Once | INTRAMUSCULAR | Status: AC
  Administered 2013-07-08: 100 ug via INTRAVENOUS
  Filled 2013-07-08: qty 2

## 2013-07-08 MED ORDER — ACETAMINOPHEN 650 MG RE SUPP: 650.0000 mg | Freq: Four times a day (QID) | RECTAL | Status: DC | PRN

## 2013-07-08 MED ORDER — IOHEXOL 300 MG/ML  SOLN
100.0000 mL | Freq: Once | INTRAMUSCULAR | Status: AC | PRN
  Administered 2013-07-08: 100 mL via INTRAVENOUS

## 2013-07-08 MED ORDER — ENOXAPARIN SODIUM 40 MG/0.4ML ~~LOC~~ SOLN
40.0000 mg | SUBCUTANEOUS | Status: DC
  Administered 2013-07-08 – 2013-07-09 (×2): 40 mg via SUBCUTANEOUS
  Filled 2013-07-08 (×3): qty 0.4

## 2013-07-08 MED ORDER — SACCHAROMYCES BOULARDII 250 MG PO CAPS
250.0000 mg | ORAL_CAPSULE | Freq: Two times a day (BID) | ORAL | Status: DC
  Administered 2013-07-08 – 2013-07-10 (×4): 250 mg via ORAL
  Filled 2013-07-08 (×6): qty 1

## 2013-07-08 NOTE — ED Notes (Signed)
Pt finished drinking contrast. CT notified.

## 2013-07-08 NOTE — ED Notes (Addendum)
N/v/d since last night and h/a for past week has been sneezing a lot feels bad x 1 week abd cramping saw her dr today and was given multiple rx for meds for nausea and bp that she has not had the chance to get filled yet

## 2013-07-08 NOTE — Progress Notes (Unsigned)
Patient ID: Stephanie Miles, female   DOB: 1981-07-13, 32 y.o.   MRN: [id number] Patient Demographics  Stephanie Miles, is a 32 y.o. female  ATF:[id number]  YHC:[id number]  DOB - May 14, 1981  Chief Complaint  Patient presents with  . Establish Care        Subjective:   Stephanie Miles today is here to establish primary care.  The patient is a 32 year old female with a history of GERD, IBS, who recently had appendectomy done last month presented for establishing primary care. The patient reports that she had been doing well except earlier this morning she started having watery diarrhea. She states it is 'different' from her IBS, feels nausea and slight abdominal cramps. Also complains of occasional migraine headaches Patient has No chest pain, No new weakness tingling or numbness, No Cough - SOB  Objective:    Filed Vitals:   07/08/13 1051  Height: 5' 6"  (1.676 m)  Weight: 220 lb 6.4 oz (99.973 kg)     ALLERGIES:   Allergies  Allergen Reactions  . Codeine Nausea Only    PAST MEDICAL HISTORY: Past Medical History  Diagnosis Date  . Hypertension   . GERD (gastroesophageal reflux disease)   . IBS (irritable bowel syndrome)     PAST SURGICAL HISTORY: Past Surgical History  Procedure Laterality Date  . Dental surgery    . Laparoscopic appendectomy N/A 06/06/2013    Procedure: APPENDECTOMY LAPAROSCOPIC;  Surgeon: Imogene Burn. Georgette Dover, MD;  Location: [facility];  Service: General;  Laterality: N/A;  . Appendectomy      FAMILY HISTORY: Family History  Problem Relation Age of Onset  . COPD Mother   . Hypertension Mother   . Cancer Father   . Diabetes Father   . Hypertension Father   . Heart disease Father   . Hyperlipidemia Father   . Asthma Sister   . Hyperlipidemia Brother   . Hypertension Brother   . Vision loss Brother     MEDICATIONS AT HOME: Prior to Admission medications   Medication Sig Start Date End Date Taking? Authorizing Provider  Prenatal Vit-Fe  Sulfate-FA (PRENATAL VITAMIN PO) Take 1 tablet by mouth daily.    Yes Historical Provider, MD  Probiotic Product (PROBIOTIC DAILY PO) Take 500 mg by mouth.   Yes Historical Provider, MD  acetaminophen (TYLENOL) 325 MG tablet Take 2 tablets (650 mg total) by mouth every 6 (six) hours as needed for mild pain (or Temp > 100). 06/07/13   Erby Pian, NP  aspirin-acetaminophen-caffeine (EXCEDRIN MIGRAINE) 250-250-65 MG per tablet Take 1-2 tablets by mouth every 6 (six) hours as needed for headache or migraine (also available over the counter). 07/08/13   Denasia Venn Krystal Eaton, MD  dicyclomine (BENTYL) 10 MG capsule Take 1 capsule (10 mg total) by mouth 2 (two) times daily as needed for spasms (abdominal cramps, IBS). 07/08/13   Adiva Boettner Krystal Eaton, MD  diphenoxylate-atropine (LOMOTIL) 2.5-0.025 MG per tablet Take 1 tablet by mouth 4 (four) times daily as needed for diarrhea or loose stools (also available over the counter). 07/08/13   Khole Branch Krystal Eaton, MD  hydrochlorothiazide (HYDRODIURIL) 12.5 MG tablet Take 1 tablet (12.5 mg total) by mouth daily. 07/08/13   Azzure Garabedian Krystal Eaton, MD  ibuprofen (ADVIL) 600 MG tablet Take 1 tablet (600 mg total) by mouth 3 (three) times daily with meals as needed. 06/07/13   Emina Riebock, NP  metroNIDAZOLE (FLAGYL) 500 MG tablet Take 1 tablet (500 mg total) by mouth 3 (three) times daily. 07/08/13   Arek Spadafore  Krystal Eaton, MD  norgestimate-ethinyl estradiol (ORTHO-CYCLEN,SPRINTEC,PREVIFEM) 0.25-35 MG-MCG tablet Take 1 tablet by mouth daily. 07/08/13   Finnis Colee Krystal Eaton, MD  promethazine (PHENERGAN) 12.5 MG tablet Take 2 tablets (25 mg total) by mouth every 6 (six) hours as needed for nausea. 07/08/13   Latorsha Curling Krystal Eaton, MD    REVIEW OF SYSTEMS:  Constitutional:   No   Fevers, chills, fatigue.  HEENT:    No headaches, Sore throat,   Cardio-vascular: No chest pain,  Orthopnea, swelling in lower extremities, anasarca, palpitations  GI:  No abdominal pain, nausea, vomiting, diarrhea  Resp: No  shortness of breath,  No coughing up of blood.No cough.No wheezing.  Skin:  no rash or lesions.  GU:  no dysuria, change in color of urine, no urgency or frequency.  No flank pain.  Musculoskeletal: No joint pain or swelling.  No decreased range of motion.  No back pain.  Psych: No change in mood or affect. No depression or anxiety.  No memory loss.   Exam  General appearance :Awake, alert, NAD, Speech Clear. HEENT: Atraumatic and Normocephalic, PERLA Neck: supple, no JVD. No cervical lymphadenopathy.  Chest: clear to auscultation bilaterally, no wheezing, rales or rhonchi CVS: S1 S2 regular, no murmurs.  Abdomen: soft, NBS, NT, ND, no gaurding, rigidity or rebound. Extremities: No cyanosis, clubbing, B/L Lower Ext shows no edema,  Neurology: Awake alert, and oriented X 3, CN II-XII intact, Non focal Skin:No Rash or lesions Wounds: N/A    Data Review   Basic Metabolic Panel: No results found for this basename: NA, K, CL, CO2, GLUCOSE, BUN, CREATININE, CALCIUM, MG, PHOS,  in the last 168 hours Liver Function Tests: No results found for this basename: AST, ALT, ALKPHOS, BILITOT, PROT, ALBUMIN,  in the last 168 hours  CBC: No results found for this basename: WBC, NEUTROABS, HGB, HCT, MCV, PLT,  in the last 168 hours ------------------------------------------------------------------------------------------------------------------ No results found for this basename: HGBA1C,  in the last 72 hours ------------------------------------------------------------------------------------------------------------------ No results found for this basename: CHOL, HDL, LDLCALC, TRIG, CHOLHDL, LDLDIRECT,  in the last 72 hours ------------------------------------------------------------------------------------------------------------------ No results found for this basename: TSH, T4TOTAL, FREET3, T3FREE, THYROIDAB,  in the last 72  hours ------------------------------------------------------------------------------------------------------------------ No results found for this basename: VITAMINB12, FOLATE, FERRITIN, TIBC, IRON, RETICCTPCT,  in the last 72 hours  Coagulation profile  No results found for this basename: INR, PROTIME,  in the last 168 hours    Assessment & Plan   Active Problems: Patient Active Problem List   Diagnosis Date Noted  . Diarrhea - Will check C. difficile PCR, patient was in the hospital last month,  - Placed on Flagyl for a week, however if she is positive for C. difficile, will extend by another week. If negative patient will be called to stop Flagyl  - Placed on an as needed Lomotil, Bentyl for IBS   07/08/2013  . Routine general medical examination at a health care facility - She had a flu shot last month  - She had Pap smear done this year in April  - She will start her screening mammogram next year when she turns 35, patient's father had breast cancer and had radical mastectomy and therapy.   07/08/2013  . Acute appendicitis Stable, doing well, had a followup appointment with surgery   06/06/2013  . IBS (irritable bowel syndrome) - Started on Bentyl as needed, continue probiotic  06/06/2013  . HTN (hypertension) with dependent peripheral edema  - Patient reports that she used to  be on hctz and lisinopril when she was in her 20's, and subsequently her BP stabilized. HCTZ and lisinopril was discontinued. I will start her on HCTZ 12.5 mg daily with clear instructions to the patient to start next week after her diarrhea has resolved completely.  06/06/2013    Recommendations: will check labs next visit, recent labs from November 2004 Demerol normal   Follow-up in 4 months   Mykael Trott M.D. 07/08/2013, 11:17 AM

## 2013-07-08 NOTE — ED Notes (Signed)
Pt returned from radiology.

## 2013-07-08 NOTE — H&P (Signed)
PCP:  Ripu Rai   Chief Complaint:  Nausea vomiting diarrhea and headache  HPI: Stephanie Miles is a 32 y.o. female   has a past medical history of Hypertension; GERD (gastroesophageal reflux disease); IBS (irritable bowel syndrome); and Migraine headache.   Presented with  About 1 month ago on November 13 th she had laparoscopic appendectomy for appendicitis.  She went for her follow up last week and everything was ok. 1 day ago she felt tired but this AM woke up with nausea and vomiting she went to [facility] and was seen by Dr. Tana Coast. She started to develop severe nausea/ vomiting and diarrhea while int he office and was sent to ER to be evaluated for dehydration and C.Diff. She has hx of migraine headaches and have had one today. She has low grade fever up to 99.3 She has had 7-8 bowel movements today foul smelling. No blood in stool. Denies any travel history.  She was prescribed Flagyl for possible C.diff but she never had a chance to fill the prescription and she became sick rapidly.    Review of Systems:    Pertinent positives include: Fevers, chills, fatigue, nausea, vomiting, diarrhea, abdominal pain,   Constitutional:  No weight loss, night sweats, weight loss  HEENT:  No headaches, Difficulty swallowing,Tooth/dental problems,Sore throat,  No sneezing, itching, ear ache, nasal congestion, post nasal drip,  Cardio-vascular:  No chest pain, Orthopnea, PND, anasarca, dizziness, palpitations.no Bilateral lower extremity swelling  GI:  No heartburn, indigestion,  change in bowel habits, loss of appetite, melena, blood in stool, hematemesis Resp:  no shortness of breath at rest. No dyspnea on exertion, No excess mucus, no productive cough, No non-productive cough, No coughing up of blood.No change in color of mucus.No wheezing. Skin:  no rash or lesions. No jaundice GU:  no dysuria, change in color of urine, no urgency or frequency. No straining to urinate.  No flank  pain.  Musculoskeletal:  No joint pain or no joint swelling. No decreased range of motion. No back pain.  Psych:  No change in mood or affect. No depression or anxiety. No memory loss.  Neuro: no localizing neurological complaints, no tingling, no weakness, no double vision, no gait abnormality, no slurred speech, no confusion  Otherwise ROS are negative except for above, 10 systems were reviewed  Past Medical History: Past Medical History  Diagnosis Date  . Hypertension   . GERD (gastroesophageal reflux disease)   . IBS (irritable bowel syndrome)   . Migraine headache    Past Surgical History  Procedure Laterality Date  . Dental surgery    . Laparoscopic appendectomy N/A 06/06/2013    Procedure: APPENDECTOMY LAPAROSCOPIC;  Surgeon: Imogene Burn. Georgette Dover, MD;  Location: [facility];  Service: General;  Laterality: N/A;  . Appendectomy       Medications: Prior to Admission medications   Medication Sig Start Date End Date Taking? Authorizing Provider  aspirin-acetaminophen-caffeine (EXCEDRIN MIGRAINE) [phone] MG per tablet Take 1-2 tablets by mouth every 6 (six) hours as needed for headache or migraine (also available over the counter). 07/08/13  Yes Ripudeep Krystal Eaton, MD  norgestimate-ethinyl estradiol (ORTHO-CYCLEN,SPRINTEC,PREVIFEM) 0.25-35 MG-MCG tablet Take 1 tablet by mouth every evening.   Yes Historical Provider, MD  Prenatal Vit-Fe Sulfate-FA (PRENATAL VITAMIN PO) Take 1 tablet by mouth every evening.    Yes Historical Provider, MD  Probiotic Product (PROBIOTIC DAILY PO) Take 500 mg by mouth every evening.    Yes Historical Provider, MD  dicyclomine (BENTYL)  10 MG capsule Take 1 capsule (10 mg total) by mouth 2 (two) times daily as needed for spasms (abdominal cramps, IBS). 07/08/13   Ripudeep Krystal Eaton, MD  diphenoxylate-atropine (LOMOTIL) 2.5-0.025 MG per tablet Take 1 tablet by mouth 4 (four) times daily as needed for diarrhea or loose stools (also available over the counter). 07/08/13    Ripudeep Krystal Eaton, MD  hydrochlorothiazide (HYDRODIURIL) 12.5 MG tablet Take 1 tablet (12.5 mg total) by mouth daily. 07/08/13   Ripudeep Krystal Eaton, MD  metroNIDAZOLE (FLAGYL) 500 MG tablet Take 1 tablet (500 mg total) by mouth 3 (three) times daily. 07/08/13   Ripudeep Krystal Eaton, MD  promethazine (PHENERGAN) 12.5 MG tablet Take 2 tablets (25 mg total) by mouth every 6 (six) hours as needed for nausea. 07/08/13   Ripudeep Krystal Eaton, MD    Allergies:   Allergies  Allergen Reactions  . Codeine Nausea Only    Social History:  Ambulatory  independently  Lives at  home   reports that she quit smoking about 4 months ago. She does not have any smokeless tobacco history on file. She reports that she drinks alcohol. She reports that she does not use illicit drugs.   Family History: family history includes Asthma in her sister; COPD in her mother; Cancer in her father; Diabetes in her father; Heart disease in her father; Hyperlipidemia in her brother and father; Hypertension in her brother, father, and mother; Vision loss in her brother.    Physical Exam: Patient Vitals for the past 24 hrs:  BP Temp Temp src Pulse Resp SpO2 Height Weight  07/08/13 1934 139/51 mmHg 99.3 F (37.4 C) Oral 115 18 100 % - -  07/08/13 1700 - - - 86 - 98 % - -  07/08/13 1648 - - - 89 - 100 % - -  07/08/13 1515 - - - 95 - 99 % - -  07/08/13 1514 146/86 mmHg - - 96 16 99 % - -  07/08/13 1310 142/96 mmHg 98.3 F (36.8 C) Oral 125 16 99 % - -  07/08/13 1209 135/89 mmHg 97.7 F (36.5 C) Oral 122 20 97 % 5' 6"  (1.676 m) 99.791 kg (220 lb)    1. General:  in No Acute distress 2. Psychological: Alert and   Oriented 3. Head/ENT:    Dry Mucous Membranes                          Head Non traumatic, neck supple                          Normal   Dentition 4. SKIN:  decreased Skin turgor,  Skin clean Dry and intact no rash 5. Heart: Regular rate and rhythm no Murmur, Rub or gallop 6. Lungs: Clear to auscultation bilaterally, no  wheezes or crackles   7. Abdomen: Soft,  tender,  Distended decreased bowel sounds 8. Lower extremities: no clubbing, cyanosis, or edema 9. Neurologically Grossly intact, moving all 4 extremities equally 10. MSK: Normal range of motion  body mass index is 35.53 kg/(m^2).   Labs on Admission:   Recent Labs  07/08/13 1215  NA 139  K 3.9  CL 104  CO2 20  GLUCOSE 107*  BUN 15  CREATININE 0.84  CALCIUM 9.4    Recent Labs  07/08/13 1215  AST 32  ALT 41*  ALKPHOS 54  BILITOT 0.3  PROT 7.2  ALBUMIN  3.8    Recent Labs  07/08/13 1215  LIPASE 38    Recent Labs  07/08/13 1215  WBC 16.9*  NEUTROABS 13.7*  HGB 14.4  HCT 42.3  MCV 86.0  PLT 302   No results found for this basename: CKTOTAL, CKMB, CKMBINDEX, TROPONINI,  in the last 72 hours No results found for this basename: TSH, T4TOTAL, FREET3, T3FREE, THYROIDAB,  in the last 72 hours No results found for this basename: VITAMINB12, FOLATE, FERRITIN, TIBC, IRON, RETICCTPCT,  in the last 72 hours No results found for this basename: HGBA1C    Estimated Creatinine Clearance: 112.5 ml/min (by C-G formula based on Cr of 0.84). ABG No results found for this basename: phart, pco2, po2, hco3, tco2, acidbasedef, o2sat     No results found for this basename: DDIMER     Other results:  I have pearsonaly reviewed this: ECG REPORT  Rate: 125  Rhythm: sinus tachycardia ST&T Change: no ischemic changed  UA UTI WBC 7-10   Cultures:    Component Value Date/Time   SDES URINE, CLEAN CATCH 06/06/2013 0924   SPECREQUEST NONE 06/06/2013 0924   CULT  Value: Multiple bacterial morphotypes present, none predominant. Suggest appropriate recollection if clinically indicated. Performed at [facility] 06/06/2013 7035   REPTSTATUS 06/07/2013 FINAL 06/06/2013 0924       Radiological Exams on Admission: Dg Chest 2 View  07/08/2013   CLINICAL DATA:  Nausea.  Diarrhea.  EXAM: CHEST  2 VIEW  COMPARISON:  None.   FINDINGS: A tiny bandlike density in the right pulmonary apex of questionable etiology is present. This could represent a focal area pleural-parenchymal scarring. No focal infiltrate noted. No pleural effusion pneumothorax. Mediastinum and hilar structures normal. Heart size is normal. No acute bony abnormality identified.  IMPRESSION: No active cardiopulmonary disease.   Electronically Signed   By: Marcello Moores  Register   On: 07/08/2013 14:40   Ct Abdomen Pelvis W Contrast  07/08/2013   CLINICAL DATA:  Nausea, vomiting, and diarrhea; abdominal cramping ; recent appendectomy  EXAM: CT ABDOMEN AND PELVIS WITH CONTRAST  TECHNIQUE: Multidetector CT imaging of the abdomen and pelvis was performed using the standard protocol following bolus administration of intravenous contrast. Oral contrast was also administered.  CONTRAST:  172m OMNIPAQUE IOHEXOL 300 MG/ML  SOLN  COMPARISON:  June 06, 2013  FINDINGS: Lung bases are clear.  There is a small hiatal hernia.  The liver is enlarged, measuring 20.5 cm in length. There is diffuse fatty change in the liver. No focal liver lesions are identified. The gallbladder wall is not appreciably thickened.  Spleen, pancreas, and adrenals appear normal. Kidneys bilaterally show no mass or hydronephrosis. There is no renal or ureteral calculus on either side.  In the pelvis, the urinary bladder is midline. There is no pelvic mass or fluid collection. The appendix is absent.  Several loops of small bowel are mildly dilated. Most of the small bowel is fluid filled. There is no well-defined focus of obstruction. No free air or portal venous air apparent.  There is some mild wall thickening in the descending colon, probably due to collapse. No colonic inflammation is appreciable.  There is no ascites, adenopathy, or abscess in the abdomen or pelvis. Aorta is nonaneurysmal. There are no blastic or lytic bone lesions.  IMPRESSION:  No small bowel loops are fluid-filled with several loops  of small bowel borderline prominent. Suspect a degree of enteritis, possibly with accompanying early ileus. No frank bowel obstruction seen.  No  abscess.  No appreciable mesenteric thickening.  Liver is enlarged with fatty change.  There is a small hiatal hernia.   Electronically Signed   By: Lowella Grip M.D.   On: 07/08/2013 16:55    Chart has been reviewed  Assessment/Plan  32 yo F with Ileus and enteritis after recent admission for appedicitis.   Present on Admission:  . Ileus - bowel rest, repeat KUB in AM, try to avoid narcotics if posssible . Diarrhea - given recent antibiotics will eval for C.diff, stool culture, continue falgyl . HTN (hypertension) - hold HCTZ while dehydarated . Migraine - Excedrin PRN . Sleep apnea - CPAP . UTI (urinary tract infection) - await result of urine cult. Rocephin for now if pos for c.diff or neg urine cult will stop . Dehydration - IVF   Prophylaxis:   Lovenox, Protonix  CODE STATUS: FULL CODE  Other plan as per orders.  I have spent a total of 55 min on this admission  Carmell Elgin 07/08/2013, 7:43 PM

## 2013-07-08 NOTE — Progress Notes (Unsigned)
Upon pt getting stool specimen in restroom. x2 episodes of vomiting with positve orthostats HR while standing 130 Zofran 4 mg odt given Pt sent to ER via self and assist Colleen Report given to Triage nurse Hassan Rowan

## 2013-07-08 NOTE — Progress Notes (Signed)
Pt arrived on the floor. Alert and oriented. Call light placed within reach. Will monitor.

## 2013-07-08 NOTE — Progress Notes (Signed)
Called ED, got report at 8:00pm.

## 2013-07-08 NOTE — ED Provider Notes (Signed)
CSN: [id number]     Arrival date & time 07/08/13  1159 History   First MD Initiated Contact with Patient 07/08/13 1346     Chief Complaint  Patient presents with  . Nausea  . Diarrhea    HPI Pt was seen at 1405. Per pt, c/o gradual onset and persistence of multiple intermittent episodes of N/V/D that began overnight last night.   Describes the stools as "watery." Has been associated with diffuse abd "cramping" and "bloating" for the past 1 week. Denies CP/SOB, no back pain, no fevers, no black or blood in stools or emesis. Pt also c/o gradual onset and persistence of constant acute flair of her chronic migraine headache for the past 1 week.  Describes the headache as per her usual chronic migraine headache pain pattern for many years.  Denies headache was sudden or maximal in onset or at any time.  Denies visual changes, no focal motor weakness, no tingling/numbness in extremities.     Past Medical History  Diagnosis Date  . Hypertension   . GERD (gastroesophageal reflux disease)   . IBS (irritable bowel syndrome)   . Migraine headache    Past Surgical History  Procedure Laterality Date  . Dental surgery    . Laparoscopic appendectomy N/A 06/06/2013    Procedure: APPENDECTOMY LAPAROSCOPIC;  Surgeon: Imogene Burn. Georgette Dover, MD;  Location: [facility];  Service: General;  Laterality: N/A;  . Appendectomy     Family History  Problem Relation Age of Onset  . COPD Mother   . Hypertension Mother   . Cancer Father   . Diabetes Father   . Hypertension Father   . Heart disease Father   . Hyperlipidemia Father   . Asthma Sister   . Hyperlipidemia Brother   . Hypertension Brother   . Vision loss Brother    History  Substance Use Topics  . Smoking status: Former Smoker -- 1.00 packs/day for 20 years    Quit date: 02/22/2013  . Smokeless tobacco: Not on file  . Alcohol Use: Yes     Comment: social    Review of Systems ROS: Statement: All systems negative except as marked or noted in the  HPI; Constitutional: Negative for fever and chills. ; ; Eyes: Negative for eye pain, redness and discharge. ; ; ENMT: Negative for ear pain, hoarseness, nasal congestion, sinus pressure and sore throat. ; ; Cardiovascular: Negative for chest pain, palpitations, diaphoresis, dyspnea and peripheral edema. ; ; Respiratory: Negative for cough, wheezing and stridor. ; ; Gastrointestinal: +N/V/D, abd pain. Negative for blood in stool, hematemesis, jaundice and rectal bleeding. . ; ; Genitourinary: Negative for dysuria, flank pain and hematuria. ; ; Musculoskeletal: Negative for back pain and neck pain. Negative for swelling and trauma.; ; Skin: Negative for pruritus, rash, abrasions, blisters, bruising and skin lesion.; ; Neuro: +migraine headache. Negative for lightheadedness and neck stiffness. Negative for weakness, altered level of consciousness , altered mental status, extremity weakness, paresthesias, involuntary movement, seizure and syncope.       Allergies  Codeine  Home Medications   Current Outpatient Rx  Name  Route  Sig  Dispense  Refill  . aspirin-acetaminophen-caffeine (EXCEDRIN MIGRAINE) 250-250-65 MG per tablet   Oral   Take 1-2 tablets by mouth every 6 (six) hours as needed for headache or migraine (also available over the counter).   30 tablet   3   . norgestimate-ethinyl estradiol (ORTHO-CYCLEN,SPRINTEC,PREVIFEM) 0.25-35 MG-MCG tablet   Oral   Take 1 tablet by mouth every  evening.         . Prenatal Vit-Fe Sulfate-FA (PRENATAL VITAMIN PO)   Oral   Take 1 tablet by mouth every evening.          . Probiotic Product (PROBIOTIC DAILY PO)   Oral   Take 500 mg by mouth every evening.          . dicyclomine (BENTYL) 10 MG capsule   Oral   Take 1 capsule (10 mg total) by mouth 2 (two) times daily as needed for spasms (abdominal cramps, IBS).   60 capsule   3   . diphenoxylate-atropine (LOMOTIL) 2.5-0.025 MG per tablet   Oral   Take 1 tablet by mouth 4 (four) times  daily as needed for diarrhea or loose stools (also available over the counter).   60 tablet   3   . hydrochlorothiazide (HYDRODIURIL) 12.5 MG tablet   Oral   Take 1 tablet (12.5 mg total) by mouth daily.   30 tablet   5   . metroNIDAZOLE (FLAGYL) 500 MG tablet   Oral   Take 1 tablet (500 mg total) by mouth 3 (three) times daily.   21 tablet   0   . promethazine (PHENERGAN) 12.5 MG tablet   Oral   Take 2 tablets (25 mg total) by mouth every 6 (six) hours as needed for nausea.   30 tablet   0    BP 146/86  Pulse 95  Temp(Src) 98.3 F (36.8 C) (Oral)  Resp 16  Ht 5' 6"  (1.676 m)  Wt 220 lb (99.791 kg)  BMI 35.53 kg/m2  SpO2 99% Filed Vitals:   07/08/13 1209 07/08/13 1310 07/08/13 1514 07/08/13 1515  BP: 135/89 142/96 146/86   Pulse: 122 125 96 95  Temp: 97.7 F (36.5 C) 98.3 F (36.8 C)    TempSrc: Oral Oral    Resp: 20 16 16    Height: 5' 6"  (1.676 m)     Weight: 220 lb (99.791 kg)     SpO2: 97% 99% 99% 99%    Physical Exam 1410: Physical examination:  Nursing notes reviewed; Vital signs and O2 SAT reviewed;  Constitutional: Well developed, Well nourished, Well hydrated, In no acute distress; Head:  Normocephalic, atraumatic; Eyes: EOMI, PERRL, No scleral icterus; ENMT: Mouth and pharynx normal, Mucous membranes moist; Neck: Supple, Full range of motion, No meningeal signs. No lymphadenopathy; Cardiovascular: Tachycardic rate and rhythm, No gallop; Respiratory: Breath sounds clear & equal bilaterally, No rales, rhonchi, wheezes.  Speaking full sentences with ease, Normal respiratory effort/excursion; Chest: Nontender, Movement normal; Abdomen: Soft, +mild diffuse tenderness to palp. No rebound or guarding. Nondistended, Normal bowel sounds; Genitourinary: No CVA tenderness; Extremities: Pulses normal, No tenderness, No edema, No calf edema or asymmetry.; Neuro: AA&Ox3, Major CN grossly intact.  Speech clear. Climbs on and off stretcher easily by herself. Gait steady. No  gross focal motor or sensory deficits in extremities.; Skin: Color normal, Warm, Dry.   ED Course  Procedures   EKG Interpretation    Date/Time:  Monday July 08 2013 12:08:37 EST Ventricular Rate:  125 PR Interval:  158 QRS Duration: 84 QT Interval:  308 QTC Calculation: 444 R Axis:   23 Text Interpretation:  Sinus tachycardia Otherwise normal ECG When compared with ECG of 06/09/2012, Rate faster Confirmed by [facility]  MD, Nunzio Cory [id number]) on 07/08/2013 2:20:17 PM            MDM  MDM Reviewed: previous chart, nursing note and vitals Reviewed previous: labs  Interpretation: labs and x-ray   Results for orders placed during the hospital encounter of 07/08/13  CBC WITH DIFFERENTIAL      Result Value Range   WBC 16.9 (*) 4.0 - 10.5 K/uL   RBC 4.92  3.87 - 5.11 MIL/uL   Hemoglobin 14.4  12.0 - 15.0 g/dL   HCT 42.3  36.0 - 46.0 %   MCV 86.0  78.0 - 100.0 fL   MCH 29.3  26.0 - 34.0 pg   MCHC 34.0  30.0 - 36.0 g/dL   RDW 13.0  11.5 - 15.5 %   Platelets 302  150 - 400 K/uL   Neutrophils Relative % 81 (*) 43 - 77 %   Neutro Abs 13.7 (*) 1.7 - 7.7 K/uL   Lymphocytes Relative 12  12 - 46 %   Lymphs Abs 2.1  0.7 - 4.0 K/uL   Monocytes Relative 5  3 - 12 %   Monocytes Absolute 0.8  0.1 - 1.0 K/uL   Eosinophils Relative 2  0 - 5 %   Eosinophils Absolute 0.3  0.0 - 0.7 K/uL   Basophils Relative 0  0 - 1 %   Basophils Absolute 0.0  0.0 - 0.1 K/uL  COMPREHENSIVE METABOLIC PANEL      Result Value Range   Sodium 139  135 - 145 mEq/L   Potassium 3.9  3.5 - 5.1 mEq/L   Chloride 104  96 - 112 mEq/L   CO2 20  19 - 32 mEq/L   Glucose, Bld 107 (*) 70 - 99 mg/dL   BUN 15  6 - 23 mg/dL   Creatinine, Ser 0.84  0.50 - 1.10 mg/dL   Calcium 9.4  8.4 - 10.5 mg/dL   Total Protein 7.2  6.0 - 8.3 g/dL   Albumin 3.8  3.5 - 5.2 g/dL   AST 32  0 - 37 U/L   ALT 41 (*) 0 - 35 U/L   Alkaline Phosphatase 54  39 - 117 U/L   Total Bilirubin 0.3  0.3 - 1.2 mg/dL   GFR calc non Af Amer 90  (*) >90 mL/min   GFR calc Af Amer >90  >90 mL/min  LIPASE, BLOOD      Result Value Range   Lipase 38  11 - 59 U/L  URINALYSIS, ROUTINE W REFLEX MICROSCOPIC      Result Value Range   Color, Urine YELLOW  YELLOW   APPearance HAZY (*) CLEAR   Specific Gravity, Urine 1.027  1.005 - 1.030   pH 5.0  5.0 - 8.0   Glucose, UA NEGATIVE  NEGATIVE mg/dL   Hgb urine dipstick TRACE (*) NEGATIVE   Bilirubin Urine NEGATIVE  NEGATIVE   Ketones, ur NEGATIVE  NEGATIVE mg/dL   Protein, ur 30 (*) NEGATIVE mg/dL   Urobilinogen, UA 0.2  0.0 - 1.0 mg/dL   Nitrite NEGATIVE  NEGATIVE   Leukocytes, UA SMALL (*) NEGATIVE  URINE MICROSCOPIC-ADD ON      Result Value Range   Squamous Epithelial / LPF FEW (*) RARE   WBC, UA 7-10  <3 WBC/hpf   RBC / HPF 0-2  <3 RBC/hpf   Bacteria, UA FEW (*) RARE   Casts HYALINE CASTS (*) NEGATIVE   Urine-Other MUCOUS PRESENT    POCT PREGNANCY, URINE      Result Value Range   Preg Test, Ur NEGATIVE  NEGATIVE   Dg Chest 2 View 07/08/2013   CLINICAL DATA:  Nausea.  Diarrhea.  EXAM: CHEST  2 VIEW  COMPARISON:  None.  FINDINGS: A tiny bandlike density in the right pulmonary apex of questionable etiology is present. This could represent a focal area pleural-parenchymal scarring. No focal infiltrate noted. No pleural effusion pneumothorax. Mediastinum and hilar structures normal. Heart size is normal. No acute bony abnormality identified.  IMPRESSION: No active cardiopulmonary disease.   Electronically Signed   By: Marcello Moores  Register   On: 07/08/2013 14:40    1630:  HR improved after IVF.  IV meds given for migraine headache with improvement. Pt has tol PO while in the ED without N/V. No stooling while in the ED. +UTI; UC pending.  CT A/P pending. Sign out to Dr. Audie Pinto.     Alfonzo Feller, DO 07/08/13 [phone]

## 2013-07-08 NOTE — Progress Notes (Signed)
Called ED nurse, put on hold for 41mns for report. Will call back.

## 2013-07-09 ENCOUNTER — Inpatient Hospital Stay (HOSPITAL_COMMUNITY): Payer: PRIVATE HEALTH INSURANCE

## 2013-07-09 DIAGNOSIS — G4730 Sleep apnea, unspecified: Secondary | ICD-10-CM

## 2013-07-09 DIAGNOSIS — K219 Gastro-esophageal reflux disease without esophagitis: Secondary | ICD-10-CM

## 2013-07-09 DIAGNOSIS — R112 Nausea with vomiting, unspecified: Secondary | ICD-10-CM

## 2013-07-09 DIAGNOSIS — E860 Dehydration: Secondary | ICD-10-CM

## 2013-07-09 DIAGNOSIS — R197 Diarrhea, unspecified: Secondary | ICD-10-CM

## 2013-07-09 DIAGNOSIS — R109 Unspecified abdominal pain: Secondary | ICD-10-CM

## 2013-07-09 LAB — CBC
HCT: 35.1 % — ABNORMAL LOW (ref 36.0–46.0)
MCHC: 33.6 g/dL (ref 30.0–36.0)
MCV: 85.8 fL (ref 78.0–100.0)
Platelets: 208 10*3/uL (ref 150–400)
RDW: 13.3 % (ref 11.5–15.5)
WBC: 7.2 10*3/uL (ref 4.0–10.5)

## 2013-07-09 LAB — COMPREHENSIVE METABOLIC PANEL
ALT: 31 U/L (ref 0–35)
Albumin: 2.8 g/dL — ABNORMAL LOW (ref 3.5–5.2)
Alkaline Phosphatase: 41 U/L (ref 39–117)
BUN: 14 mg/dL (ref 6–23)
Chloride: 106 mEq/L (ref 96–112)
GFR calc Af Amer: >90 mL/min (ref >90)
GFR calc non Af Amer: 88 mL/min — ABNORMAL LOW (ref >90)
Glucose, Bld: 100 mg/dL — ABNORMAL HIGH (ref 70–99)
Potassium: 3.3 mEq/L — ABNORMAL LOW (ref 3.5–5.1)
Sodium: 136 mEq/L (ref 135–145)
Total Bilirubin: 0.3 mg/dL (ref 0.3–1.2)
Total Protein: 5.6 g/dL — ABNORMAL LOW (ref 6.0–8.3)

## 2013-07-09 LAB — CLOSTRIDIUM DIFFICILE BY PCR: Toxigenic C. Difficile by PCR: NEGATIVE

## 2013-07-09 LAB — FECAL LACTOFERRIN, QUANT: Fecal Lactoferrin: POSITIVE

## 2013-07-09 LAB — URINE CULTURE

## 2013-07-09 LAB — PHOSPHORUS: Phosphorus: 2.6 mg/dL (ref 2.3–4.6)

## 2013-07-09 MED ORDER — DIPHENHYDRAMINE HCL 25 MG PO CAPS
25.0000 mg | ORAL_CAPSULE | Freq: Every evening | ORAL | Status: DC | PRN
  Administered 2013-07-09: 25 mg via ORAL
  Filled 2013-07-09: qty 1

## 2013-07-09 MED ORDER — DIPHENHYDRAMINE HCL 25 MG PO CAPS
25.0000 mg | ORAL_CAPSULE | Freq: Once | ORAL | Status: AC
  Administered 2013-07-09: 25 mg via ORAL
  Filled 2013-07-09: qty 1

## 2013-07-09 MED ORDER — CIPROFLOXACIN HCL 500 MG PO TABS
500.0000 mg | ORAL_TABLET | Freq: Two times a day (BID) | ORAL | Status: DC
  Administered 2013-07-09 – 2013-07-10 (×3): 500 mg via ORAL
  Filled 2013-07-09 (×5): qty 1

## 2013-07-09 MED ORDER — POTASSIUM CHLORIDE CRYS ER 20 MEQ PO TBCR
40.0000 meq | EXTENDED_RELEASE_TABLET | Freq: Four times a day (QID) | ORAL | Status: AC
  Administered 2013-07-09 (×2): 40 meq via ORAL
  Filled 2013-07-09 (×2): qty 2

## 2013-07-09 MED ORDER — KETOROLAC TROMETHAMINE 15 MG/ML IJ SOLN
30.0000 mg | Freq: Three times a day (TID) | INTRAMUSCULAR | Status: DC | PRN
  Administered 2013-07-09 – 2013-07-10 (×2): 30 mg via INTRAVENOUS
  Filled 2013-07-09 (×2): qty 2

## 2013-07-09 NOTE — Progress Notes (Signed)
[facility] PROGRESS NOTE  Jessicca Stitzer BHA:[id number] DOB: 1981-07-05 DOA: 07/08/2013 PCP: Angelica Chessman, MD  HPI/Subjective: Feels better, denies any nausea or vomiting. Still has loose stools.  Assessment/Plan: Active Problems:   HTN (hypertension)   Diarrhea   Ileus   Migraine   Sleep apnea   UTI (urinary tract infection)   Ileus following gastrointestinal surgery   Dehydration   Enteritis -Patient presented with nausea, vomiting and abdominal pain as well as diarrhea. -CT scan showed enteritis, with accompanied early ileus. stools were negative for C. difficile. -Patient was on Flagyl and Rocephin switched to ciprofloxacin. -On clear liquids, advance as tolerated.  UTI -Urinalysis showed 7-10+ cells and small leukocyte esterase. -Started on Rocephin and this was switched to ciprofloxacin.  Sleep apnea -On CPAP at home, continued.  Hypokalemia -Replete with oral supplements.  Code Status: Full code Family Communication: Plan discussed with the patient. Disposition Plan: Remains inpatient   Consultants:  None  Procedures:  None  Antibiotics:  Was on Rocephin and Flagyl, switched to ciprofloxacin.   Objective: Filed Vitals:   07/09/13 1050  BP: 136/86  Pulse: 86  Temp: 99.3 F (37.4 C)  Resp: 20    Intake/Output Summary (Last 24 hours) at 07/09/13 1052 Last data filed at 07/09/13 1051  Gross per 24 hour  Intake    120 ml  Output      0 ml  Net    120 ml   Filed Weights   07/08/13 1209 07/08/13 2045  Weight: 99.791 kg (220 lb) 99.791 kg (220 lb)    Exam: General: Alert and awake, oriented x3, not in any acute distress. HEENT: anicteric sclera, pupils reactive to light and accommodation, EOMI CVS: S1-S2 clear, no murmur rubs or gallops Chest: clear to auscultation bilaterally, no wheezing, rales or rhonchi Abdomen: soft nontender, nondistended, normal bowel sounds, no organomegaly Extremities: no cyanosis, clubbing or  edema noted bilaterally Neuro: Cranial nerves II-XII intact, no focal neurological deficits  Data Reviewed: Basic Metabolic Panel:  Recent Labs Lab 07/08/13 1215 07/09/13 0518  NA 139 136  K 3.9 3.3*  CL 104 106  CO2 20 19  GLUCOSE 107* 100*  BUN 15 14  CREATININE 0.84 0.85  CALCIUM 9.4 7.5*  MG  --  1.3*  PHOS  --  2.6   Liver Function Tests:  Recent Labs Lab 07/08/13 1215 07/09/13 0518  AST 32 25  ALT 41* 31  ALKPHOS 54 41  BILITOT 0.3 0.3  PROT 7.2 5.6*  ALBUMIN 3.8 2.8*    Recent Labs Lab 07/08/13 1215  LIPASE 38   No results found for this basename: AMMONIA,  in the last 168 hours CBC:  Recent Labs Lab 07/08/13 1215 07/09/13 0518  WBC 16.9* 7.2  NEUTROABS 13.7*  --   HGB 14.4 11.8*  HCT 42.3 35.1*  MCV 86.0 85.8  PLT 302 208   Cardiac Enzymes: No results found for this basename: CKTOTAL, CKMB, CKMBINDEX, TROPONINI,  in the last 168 hours BNP (last 3 results) No results found for this basename: PROBNP,  in the last 8760 hours CBG: No results found for this basename: GLUCAP,  in the last 168 hours  Micro Recent Results (from the past 240 hour(s))  CLOSTRIDIUM DIFFICILE BY PCR     Status: None   Collection Time    07/08/13  8:54 PM      Result Value Range Status   C difficile by pcr NEGATIVE  NEGATIVE Final     Studies: Dg  Chest 2 View  07/08/2013   CLINICAL DATA:  Nausea.  Diarrhea.  EXAM: CHEST  2 VIEW  COMPARISON:  None.  FINDINGS: A tiny bandlike density in the right pulmonary apex of questionable etiology is present. This could represent a focal area pleural-parenchymal scarring. No focal infiltrate noted. No pleural effusion pneumothorax. Mediastinum and hilar structures normal. Heart size is normal. No acute bony abnormality identified.  IMPRESSION: No active cardiopulmonary disease.   Electronically Signed   By: Marcello Moores  Register   On: 07/08/2013 14:40   Ct Abdomen Pelvis W Contrast  07/08/2013   CLINICAL DATA:  Nausea, vomiting,  and diarrhea; abdominal cramping ; recent appendectomy  EXAM: CT ABDOMEN AND PELVIS WITH CONTRAST  TECHNIQUE: Multidetector CT imaging of the abdomen and pelvis was performed using the standard protocol following bolus administration of intravenous contrast. Oral contrast was also administered.  CONTRAST:  179m OMNIPAQUE IOHEXOL 300 MG/ML  SOLN  COMPARISON:  June 06, 2013  FINDINGS: Lung bases are clear.  There is a small hiatal hernia.  The liver is enlarged, measuring 20.5 cm in length. There is diffuse fatty change in the liver. No focal liver lesions are identified. The gallbladder wall is not appreciably thickened.  Spleen, pancreas, and adrenals appear normal. Kidneys bilaterally show no mass or hydronephrosis. There is no renal or ureteral calculus on either side.  In the pelvis, the urinary bladder is midline. There is no pelvic mass or fluid collection. The appendix is absent.  Several loops of small bowel are mildly dilated. Most of the small bowel is fluid filled. There is no well-defined focus of obstruction. No free air or portal venous air apparent.  There is some mild wall thickening in the descending colon, probably due to collapse. No colonic inflammation is appreciable.  There is no ascites, adenopathy, or abscess in the abdomen or pelvis. Aorta is nonaneurysmal. There are no blastic or lytic bone lesions.  IMPRESSION:  No small bowel loops are fluid-filled with several loops of small bowel borderline prominent. Suspect a degree of enteritis, possibly with accompanying early ileus. No frank bowel obstruction seen.  No abscess.  No appreciable mesenteric thickening.  Liver is enlarged with fatty change.  There is a small hiatal hernia.   Electronically Signed   By: WLowella GripM.D.   On: 07/08/2013 16:55    Scheduled Meds: . ciprofloxacin  500 mg Oral BID  . enoxaparin (LOVENOX) injection  40 mg Subcutaneous Q24H  . norgestimate-ethinyl estradiol  1 tablet Oral QPM  . pantoprazole  (PROTONIX) IV  40 mg Intravenous QHS  . saccharomyces boulardii  250 mg Oral BID  . sucralfate  1 g Oral TID WC & HS   Continuous Infusions: . sodium chloride 150 mL/hr at 07/09/13 0111       Time spent: 35 minutes    [facility]  [facility] Pager [phone] 7PM-7AM, please contact night-coverage at [email or web address], password [email or web address]12/16/2014, 10:52 AM  LOS: 1 day

## 2013-07-09 NOTE — Progress Notes (Signed)
Pt reported to RN that when she tried to wipe after using the bathroom she noticed a small blood in the toilet paper. RN verified pt if she is having her menstrual cycle, pt denied. RN checked pt rectum, no irritation noted from diarrheal episodes. Will notify Baltazar Najjar, NP about this matter. Will continue to monitor.

## 2013-07-09 NOTE — Progress Notes (Signed)
Patient has her own birth control pills that she takes at a scheduled time. RN instructed patient to just tell RN when she is ready to take it so that it can be documented. Pharmacy has notified RN that [facility] does not carry this kind of birth control here in the hospital. Will continue to monitor patient.

## 2013-07-10 ENCOUNTER — Inpatient Hospital Stay (HOSPITAL_COMMUNITY): Payer: PRIVATE HEALTH INSURANCE

## 2013-07-10 LAB — CBC
HCT: 34 % — ABNORMAL LOW (ref 36.0–46.0)
Hemoglobin: 11.2 g/dL — ABNORMAL LOW (ref 12.0–15.0)
MCH: 28.6 pg (ref 26.0–34.0)
MCHC: 32.9 g/dL (ref 30.0–36.0)
Platelets: 207 10*3/uL (ref 150–400)
RBC: 3.91 MIL/uL (ref 3.87–5.11)
WBC: 7.9 10*3/uL (ref 4.0–10.5)

## 2013-07-10 LAB — BASIC METABOLIC PANEL
CO2: 25 mEq/L (ref 19–32)
Calcium: 7.6 mg/dL — ABNORMAL LOW (ref 8.4–10.5)
Chloride: 109 mEq/L (ref 96–112)
GFR calc Af Amer: >90 mL/min (ref >90)
GFR calc non Af Amer: >90 mL/min (ref >90)
Potassium: 3.4 mEq/L — ABNORMAL LOW (ref 3.5–5.1)
Sodium: 142 mEq/L (ref 135–145)

## 2013-07-10 MED ORDER — OMEPRAZOLE 20 MG PO CPDR: 20.0000 mg | DELAYED_RELEASE_CAPSULE | Freq: Every day | ORAL | Status: DC

## 2013-07-10 MED ORDER — PANTOPRAZOLE SODIUM 40 MG PO TBEC
40.0000 mg | DELAYED_RELEASE_TABLET | Freq: Every day | ORAL | Status: DC
  Administered 2013-07-10: 40 mg via ORAL
  Filled 2013-07-10: qty 1

## 2013-07-10 MED ORDER — SUMATRIPTAN SUCCINATE 6 MG/0.5ML ~~LOC~~ SOLN
6.0000 mg | Freq: Once | SUBCUTANEOUS | Status: AC
  Administered 2013-07-10: 6 mg via SUBCUTANEOUS
  Filled 2013-07-10: qty 0.5

## 2013-07-10 MED ORDER — NORGESTIMATE-ETH ESTRADIOL 0.25-35 MG-MCG PO TABS: 1.0000 | ORAL_TABLET | Freq: Every evening | ORAL | Status: DC

## 2013-07-10 NOTE — Discharge Summary (Signed)
PATIENT DETAILS Name: Stephanie Miles Age: 32 y.o. Sex: female Date of Birth: 05/04/1981 MRN: [id number]. Admit Date: 07/08/2013 Admitting Physician: Toy Baker, MD JYN:[id number], Gabrielle Dare, MD  Recommendations for Outpatient Follow-up:  1. Stay on liquid and soft diet for the next few days 2. Will likely need prophylactic migraine headache therapy-probably low dose of verapamil or Topamax. We'll defer to patient's primary care practitioner 3. May need to defer to GI for further management of the irritable bowel syndrome  PRIMARY DISCHARGE DIAGNOSIS:  Active Problems:   Enteritis   HTN (hypertension)   Diarrhea   Ileus   Migraine   Sleep apnea   UTI (urinary tract infection)   Dehydration      PAST MEDICAL HISTORY: Past Medical History  Diagnosis Date  . Hypertension   . GERD (gastroesophageal reflux disease)   . IBS (irritable bowel syndrome)   . Migraine headache     DISCHARGE MEDICATIONS:   Medication List    STOP taking these medications       metroNIDAZOLE 500 MG tablet  Commonly known as:  FLAGYL      TAKE these medications       aspirin-acetaminophen-caffeine 250-250-65 MG per tablet  Commonly known as:  EXCEDRIN MIGRAINE  Take 1-2 tablets by mouth every 6 (six) hours as needed for headache or migraine (also available over the counter).     dicyclomine 10 MG capsule  Commonly known as:  BENTYL  Take 1 capsule (10 mg total) by mouth 2 (two) times daily as needed for spasms (abdominal cramps, IBS).     diphenoxylate-atropine 2.5-0.025 MG per tablet  Commonly known as:  LOMOTIL  Take 1 tablet by mouth 4 (four) times daily as needed for diarrhea or loose stools (also available over the counter).     hydrochlorothiazide 12.5 MG tablet  Commonly known as:  HYDRODIURIL  Take 1 tablet (12.5 mg total) by mouth daily.     norgestimate-ethinyl estradiol 0.25-35 MG-MCG tablet  Commonly known as:  ORTHO-CYCLEN,SPRINTEC,PREVIFEM  Take 1 tablet by mouth  every evening.     omeprazole 20 MG capsule  Commonly known as:  PRILOSEC  Take 1 capsule (20 mg total) by mouth daily.     PRENATAL VITAMIN PO  Take 1 tablet by mouth every evening.     PROBIOTIC DAILY PO  Take 500 mg by mouth every evening.     promethazine 12.5 MG tablet  Commonly known as:  PHENERGAN  Take 2 tablets (25 mg total) by mouth every 6 (six) hours as needed for nausea.        ALLERGIES:   Allergies  Allergen Reactions  . Codeine Nausea Only    BRIEF HPI:  See H&P, Labs, Consult and Test reports for all details in brief, patient is a 32 year old female with past medical history of irritable bowel syndrome, migraine headaches, gastroesophageal reflux disease and IBS who presented with nausea, vomiting along with diarrhea. She also was complaining of worsening of her migraine headaches.  CONSULTATIONS:   None  PERTINENT RADIOLOGIC STUDIES: Dg Chest 2 View  07/08/2013   CLINICAL DATA:  Nausea.  Diarrhea.  EXAM: CHEST  2 VIEW  COMPARISON:  None.  FINDINGS: A tiny bandlike density in the right pulmonary apex of questionable etiology is present. This could represent a focal area pleural-parenchymal scarring. No focal infiltrate noted. No pleural effusion pneumothorax. Mediastinum and hilar structures normal. Heart size is normal. No acute bony abnormality identified.  IMPRESSION: No active cardiopulmonary disease.   Electronically  Signed   ByMarcello Moores  Register   On: 07/08/2013 14:40   Abd 1 View (kub)  07/09/2013   CLINICAL DATA:  Abdomen pain with fever.  Recent appendectomy  EXAM: ABDOMEN - 1 VIEW  COMPARISON:  CT abdomen 07/08/2013.  FINDINGS: The bowel gas pattern is normal. No radio-opaque calculi or other significant radiographic abnormality are seen.  IMPRESSION: Negative.   Electronically Signed   By: Rolla Flatten M.D.   On: 07/09/2013 14:51   Ct Abdomen Pelvis W Contrast  07/08/2013   CLINICAL DATA:  Nausea, vomiting, and diarrhea; abdominal cramping ;  recent appendectomy  EXAM: CT ABDOMEN AND PELVIS WITH CONTRAST  TECHNIQUE: Multidetector CT imaging of the abdomen and pelvis was performed using the standard protocol following bolus administration of intravenous contrast. Oral contrast was also administered.  CONTRAST:  185m OMNIPAQUE IOHEXOL 300 MG/ML  SOLN  COMPARISON:  June 06, 2013  FINDINGS: Lung bases are clear.  There is a small hiatal hernia.  The liver is enlarged, measuring 20.5 cm in length. There is diffuse fatty change in the liver. No focal liver lesions are identified. The gallbladder wall is not appreciably thickened.  Spleen, pancreas, and adrenals appear normal. Kidneys bilaterally show no mass or hydronephrosis. There is no renal or ureteral calculus on either side.  In the pelvis, the urinary bladder is midline. There is no pelvic mass or fluid collection. The appendix is absent.  Several loops of small bowel are mildly dilated. Most of the small bowel is fluid filled. There is no well-defined focus of obstruction. No free air or portal venous air apparent.  There is some mild wall thickening in the descending colon, probably due to collapse. No colonic inflammation is appreciable.  There is no ascites, adenopathy, or abscess in the abdomen or pelvis. Aorta is nonaneurysmal. There are no blastic or lytic bone lesions.  IMPRESSION:  No small bowel loops are fluid-filled with several loops of small bowel borderline prominent. Suspect a degree of enteritis, possibly with accompanying early ileus. No frank bowel obstruction seen.  No abscess.  No appreciable mesenteric thickening.  Liver is enlarged with fatty change.  There is a small hiatal hernia.   Electronically Signed   By: WLowella GripM.D.   On: 07/08/2013 16:55   Dg Abd Portable 1v  07/10/2013   CLINICAL DATA:  Increased abdominal pain and distention.  EXAM: PORTABLE ABDOMEN - 1 VIEW  COMPARISON:  Abdominal radiograph July 09, 2013  FINDINGS: Bowel gas pattern remains  nondilated and nonobstructive. No intra-abdominal mass effect, pathologic calcifications. Limited assessment for free air on this supine examination. Phleboliths in the pelvis. Soft tissue planes and included osseous structures are nonsuspicious.  IMPRESSION: Negative.   Electronically Signed   By: CElon Alas  On: 07/10/2013 04:26     PERTINENT LAB RESULTS: CBC:  Recent Labs  07/09/13 0518 07/10/13 0530  WBC 7.2 7.9  HGB 11.8* 11.2*  HCT 35.1* 34.0*  PLT 208 207   CMET CMP     Component Value Date/Time   NA 142 07/10/2013 0530   K 3.4* 07/10/2013 0530   CL 109 07/10/2013 0530   CO2 25 07/10/2013 0530   GLUCOSE 102* 07/10/2013 0530   BUN 4* 07/10/2013 0530   CREATININE 0.83 07/10/2013 0530   CALCIUM 7.6* 07/10/2013 0530   PROT 5.6* 07/09/2013 0518   ALBUMIN 2.8* 07/09/2013 0518   AST 25 07/09/2013 0518   ALT 31 07/09/2013 0518   ALKPHOS 41 07/09/2013  0518   BILITOT 0.3 07/09/2013 0518   GFRNONAA >90 07/10/2013 0530   GFRAA >90 07/10/2013 0530    GFR Estimated Creatinine Clearance: 113.8 ml/min (by C-G formula based on Cr of 0.83).  Recent Labs  07/08/13 1215  LIPASE 38   No results found for this basename: CKTOTAL, CKMB, CKMBINDEX, TROPONINI,  in the last 72 hours No components found with this basename: POCBNP,  No results found for this basename: DDIMER,  in the last 72 hours No results found for this basename: HGBA1C,  in the last 72 hours No results found for this basename: CHOL, HDL, LDLCALC, TRIG, CHOLHDL, LDLDIRECT,  in the last 72 hours  Recent Labs  07/09/13 0518  TSH 0.562   No results found for this basename: VITAMINB12, FOLATE, FERRITIN, TIBC, IRON, RETICCTPCT,  in the last 72 hours Coags: No results found for this basename: PT, INR,  in the last 72 hours Microbiology: Recent Results (from the past 240 hour(s))  URINE CULTURE     Status: None   Collection Time    07/08/13 12:29 PM      Result Value Range Status   Specimen Description  URINE, RANDOM   Final   Special Requests NONE   Final   Culture  Setup Time     Final   Value: 07/08/2013 13:47     Performed at [facility]     Final   Value: 9,000 COLONIES/ML     Performed at [facility]   Culture     Final   Value: INSIGNIFICANT GROWTH     Performed at [facility]   Report Status 07/09/2013 FINAL   Final  CLOSTRIDIUM DIFFICILE BY PCR     Status: None   Collection Time    07/08/13  8:54 PM      Result Value Range Status   C difficile by pcr NEGATIVE  NEGATIVE Final  STOOL CULTURE     Status: None   Collection Time    07/08/13  8:54 PM      Result Value Range Status   Specimen Description PERIRECTAL   Final   Special Requests Normal   Final   Culture     Final   Value: NO SUSPICIOUS COLONIES, CONTINUING TO HOLD     Note: REDUCED NORMAL FLORA PRESENT     Performed at [facility]:   Active Problems: Enteritis - Suspect this to be a viral syndrome - Patient was admitted with nausea vomiting, diarrhea and abdominal pain. CT scan of the abdomen done on 12/15 were suspected enteritis. Patient was admitted and started on supportive care. Nausea, vomiting and diarrhea have all resolved. She has tolerated advancement to a full liquid diet. Abdomen remains soft. A abdominal x-ray was negative for any bowel dilatation. Stool C. difficile PCR was negative, stool cultures are still negative at the time of discharge. Since patient is clinically improved, suspect she can be discharged home today. I had a long discussion with the patient, and the patient's mother at bedside (who is a Therapist, sports with the health Department), patient wants to go home, patient's mother is willing to monitor the patient in the home setting. I have asked the patient to stay on a full liquid/soft diet for next few days and slowly advance as tolerated. Patient's mother has been told that in case if  the patient had  a high-grade fever, or if she had persistent nausea vomiting and diarrhea to bring the patient back to the emergency room. Since C. difficile PCR, was negative we'll discontinue Flagyl. Since afebrile with no leukocytosis, will discontinue Cipro as well.  Dehydration - Secondary to above, resolved with resolution of diarrhea and vomiting. She was also given IV fluids.  Migraine headache - This is a chronic issue. She'll be given a dose of Imitrex prior to discharge. She likely will need, prophylactic therapy either in the form of verapamil or Topamax, we'll defer this to be done in the outpatient setting. - She did continue to have intermittent headaches during the hospital stay, that was treated with Excedrin Migraine and IV Toradol.  UTI -She was briefly maintained on Rocephin and then switched to ciprofloxacin, send urine cultures is negative, we will stop all antibiotics on discharge.  Sleep apnea - Continue with CPAP on discharge   TODAY-DAY OF DISCHARGE:  Subjective:   Kenisha Lynds today has no chest abdominal pain,no new weakness tingling or numbness, feels much better wants to go home today. She's had no further nausea and vomiting for the past 48 hours, no diarrhea for the past 24 hours. Her only complaint is migraine headaches. She is tolerating advancement in diet.  Objective:   Blood pressure 125/84, pulse 70, temperature 98.4 F (36.9 C), temperature source Oral, resp. rate 18, height 5' 6"  (1.676 m), weight 99.791 kg (220 lb), SpO2 98.00%.  Intake/Output Summary (Last 24 hours) at 07/10/13 1013 Last data filed at 07/09/13 2100  Gross per 24 hour  Intake 3869.17 ml  Output      0 ml  Net 3869.17 ml   Filed Weights   07/08/13 1209 07/08/13 2045  Weight: 99.791 kg (220 lb) 99.791 kg (220 lb)    Exam Awake Alert, Oriented *3, No new F.N deficits, Normal affect [place].AT,PERRAL Supple Neck,No JVD, No cervical lymphadenopathy appriciated.   Symmetrical Chest wall movement, Good air movement bilaterally, CTAB RRR,No Gallops,Rubs or new Murmurs, No Parasternal Heave +ve B.Sounds, Abd Soft, Non tender, No organomegaly appriciated, No rebound -guarding or rigidity. No Cyanosis, Clubbing or edema, No new Rash or bruise  DISCHARGE CONDITION: Stable  DISPOSITION: Home  DISCHARGE INSTRUCTIONS:    Activity:  As tolerated   Diet recommendation: Heart Healthy diet       Discharge Orders   Future Appointments Provider Department Dept Phone   11/06/2013 11:30 AM Lorayne Marek, MD [place] [phone]   Future Orders Complete By Expires   Call MD for:  persistant nausea and vomiting  As directed    Call MD for:  severe uncontrolled pain  As directed    Diet - low sodium heart healthy  As directed    Increase activity slowly  As directed       Follow-up Information   Follow up with Angelica Chessman, MD. Schedule an appointment as soon as possible for a visit in 1 week.   Specialty:  Internal Medicine   Contact information:   [address]  [place] [address] [phone]         Total Time spent on discharge equals 45 minutes.  SignedOren Binet 07/10/2013 10:13 AM

## 2013-07-10 NOTE — Care Management Note (Signed)
    Page 1 of 1   07/10/2013     2:37:31 PM   CARE MANAGEMENT NOTE 07/10/2013  Patient:  Stephanie Miles,Stephanie Miles   Account Number:  [id number]  Date Initiated:  07/09/2013  Documentation initiated by:  [facility]  Subjective/Objective Assessment:   ileus, recent laparoscopic appendectomy for appendicitis.  pt has an orange card for meds and goes to [facility].     Action/Plan:   Anticipated DC Date:  07/10/2013   Anticipated DC Plan:  [place]  CM consult      Choice offered to / List presented to:             Status of service:  Completed, signed off Medicare Important Message given?   (If response is "NO", the following Medicare IM given date fields will be blank) Date Medicare IM given:   Date Additional Medicare IM given:    Discharge Disposition:  HOME/SELF CARE  Per UR Regulation:  Reviewed for med. necessity/level of care/duration of stay  If discussed at [place] of Stay Meetings, dates discussed:    Comments:  07/10/13 14:36 Tomi Bamberger RN, BSN [phone] patient for dc today, she has orange card for meds and will pick up her meds at the [facility] and [place].  07/09/2013 1225 UR completed. Jonnie Finner RN CCM Case Mgmt phone [phone]

## 2013-07-10 NOTE — Plan of Care (Signed)
Problem: Phase I Progression Outcomes Goal: Other Phase I Outcomes/Goals Outcome: Completed/Met Date Met:  07/10/13 Education given on Enteritis from [employer].

## 2013-07-10 NOTE — Progress Notes (Signed)
Nsg Discharge Note  Admit Date:  07/08/2013 Discharge date: 07/10/2013   Stephanie Miles to be D/C'd Home per MD order.  AVS completed.  Copy for chart, and copy for patient signed, and dated. Patient/caregiver able to verbalize understanding.  Discharge Medication:   Medication List    STOP taking these medications       metroNIDAZOLE 500 MG tablet  Commonly known as:  FLAGYL      TAKE these medications       aspirin-acetaminophen-caffeine 250-250-65 MG per tablet  Commonly known as:  EXCEDRIN MIGRAINE  Take 1-2 tablets by mouth every 6 (six) hours as needed for headache or migraine (also available over the counter).     dicyclomine 10 MG capsule  Commonly known as:  BENTYL  Take 1 capsule (10 mg total) by mouth 2 (two) times daily as needed for spasms (abdominal cramps, IBS).     diphenoxylate-atropine 2.5-0.025 MG per tablet  Commonly known as:  LOMOTIL  Take 1 tablet by mouth 4 (four) times daily as needed for diarrhea or loose stools (also available over the counter).     hydrochlorothiazide 12.5 MG tablet  Commonly known as:  HYDRODIURIL  Take 1 tablet (12.5 mg total) by mouth daily.     norgestimate-ethinyl estradiol 0.25-35 MG-MCG tablet  Commonly known as:  ORTHO-CYCLEN,SPRINTEC,PREVIFEM  Take 1 tablet by mouth every evening.     omeprazole 20 MG capsule  Commonly known as:  PRILOSEC  Take 1 capsule (20 mg total) by mouth daily.     PRENATAL VITAMIN PO  Take 1 tablet by mouth every evening.     PROBIOTIC DAILY PO  Take 500 mg by mouth every evening.     promethazine 12.5 MG tablet  Commonly known as:  PHENERGAN  Take 2 tablets (25 mg total) by mouth every 6 (six) hours as needed for nausea.        Discharge Assessment: Filed Vitals:   07/10/13 1405  BP: 142/84  Pulse: 70  Temp: 98.1 F (36.7 C)  Resp: 18   Skin clean, dry and intact without evidence of skin break down, no evidence of skin tears noted. IV catheter discontinued intact. Site  without signs and symptoms of complications - no redness or edema noted at insertion site, patient denies c/o pain - only slight tenderness at site.  Dressing with slight pressure applied.  D/c Instructions-Education: Discharge instructions given to patient/family with verbalized understanding. D/c education completed with patient/family including follow up instructions, medication list, d/c activities limitations if indicated, with other d/c instructions as indicated by MD - patient able to verbalize understanding, all questions fully answered. Patient instructed to return to ED, call 911, or call MD for any changes in condition.  Patient escorted via [place], and D/C home via private auto.  Stephanie Julius Margaretha Sheffield, RN 07/10/2013 3:40 PM

## 2013-07-10 NOTE — Progress Notes (Signed)
Pt experienced increased abd distention, complaining of increased pain in abd, and feeling like it is going to explode. MD notified, ordered abdominal film.

## 2013-07-12 LAB — STOOL CULTURE

## 2013-07-16 ENCOUNTER — Ambulatory Visit: Payer: PRIVATE HEALTH INSURANCE | Attending: Internal Medicine | Admitting: Internal Medicine

## 2013-07-16 ENCOUNTER — Encounter: Payer: Self-pay | Admitting: Internal Medicine

## 2013-07-16 VITALS — BP 131/99 | HR 112 | Temp 98.9°F | Resp 14 | Ht 66.0 in | Wt 217.2 lb

## 2013-07-16 DIAGNOSIS — K529 Noninfective gastroenteritis and colitis, unspecified: Secondary | ICD-10-CM

## 2013-07-16 DIAGNOSIS — K5289 Other specified noninfective gastroenteritis and colitis: Secondary | ICD-10-CM

## 2013-07-16 LAB — COMPLETE METABOLIC PANEL WITH GFR
ALT: 48 U/L — ABNORMAL HIGH (ref 0–35)
AST: 29 U/L (ref 0–37)
Alkaline Phosphatase: 49 U/L (ref 39–117)
CO2: 27 mEq/L (ref 19–32)
Creat: 0.78 mg/dL (ref 0.50–1.10)
GFR, Est African American: >89 mL/min
GFR, Est Non African American: >89 mL/min
Glucose, Bld: 102 mg/dL — ABNORMAL HIGH (ref 70–99)
Sodium: 138 mEq/L (ref 135–145)
Total Bilirubin: 0.2 mg/dL — ABNORMAL LOW (ref 0.3–1.2)
Total Protein: 6.6 g/dL (ref 6.0–8.3)

## 2013-07-16 LAB — MAGNESIUM: Magnesium: 1.9 mg/dL (ref 1.5–2.5)

## 2013-07-16 MED ORDER — MAGNESIUM 300 MG PO CAPS: 2.0000 | ORAL_CAPSULE | Freq: Every morning | ORAL | Status: DC

## 2013-07-16 NOTE — Progress Notes (Signed)
Patient ID: Stephanie Miles, female   DOB: 1980/11/11, 32 y.o.   MRN: [id number]   CC:  HPI: 32 year old female recently admitted for gastroenteritis, his history of irritable bowel syndrome. Who presents for a post hospital discharge followup. The patient has no complaints. She has been taking Bentyl. Hospital abdominal x-ray was negative for any bowel dilatation. Stool C. difficile PCR was negative, stool cultures are still negative at the time of discharge. She takes hydrochlorothiazide for sleep apnea She had a low potassium and magnesium during this hospitalization   Allergies  Allergen Reactions  . Codeine Nausea Only   Past Medical History  Diagnosis Date  . Hypertension   . GERD (gastroesophageal reflux disease)   . IBS (irritable bowel syndrome)   . Migraine headache    Current Outpatient Prescriptions on File Prior to Visit  Medication Sig Dispense Refill  . aspirin-acetaminophen-caffeine (EXCEDRIN MIGRAINE) 250-250-65 MG per tablet Take 1-2 tablets by mouth every 6 (six) hours as needed for headache or migraine (also available over the counter).  30 tablet  3  . dicyclomine (BENTYL) 10 MG capsule Take 1 capsule (10 mg total) by mouth 2 (two) times daily as needed for spasms (abdominal cramps, IBS).  60 capsule  3  . hydrochlorothiazide (HYDRODIURIL) 12.5 MG tablet Take 1 tablet (12.5 mg total) by mouth daily.  30 tablet  5  . omeprazole (PRILOSEC) 20 MG capsule Take 1 capsule (20 mg total) by mouth daily.  30 capsule  0  . Prenatal Vit-Fe Sulfate-FA (PRENATAL VITAMIN PO) Take 1 tablet by mouth every evening.       . Probiotic Product (PROBIOTIC DAILY PO) Take 500 mg by mouth every evening.       . promethazine (PHENERGAN) 12.5 MG tablet Take 2 tablets (25 mg total) by mouth every 6 (six) hours as needed for nausea.  30 tablet  0  . diphenoxylate-atropine (LOMOTIL) 2.5-0.025 MG per tablet Take 1 tablet by mouth 4 (four) times daily as needed for diarrhea or loose stools (also  available over the counter).  60 tablet  3  . norgestimate-ethinyl estradiol (ORTHO-CYCLEN,SPRINTEC,PREVIFEM) 0.25-35 MG-MCG tablet Take 1 tablet by mouth every evening.       No current facility-administered medications on file prior to visit.   Family History  Problem Relation Age of Onset  . COPD Mother   . Hypertension Mother   . Cancer Father   . Diabetes Father   . Hypertension Father   . Heart disease Father   . Hyperlipidemia Father   . Asthma Sister   . Hyperlipidemia Brother   . Hypertension Brother   . Vision loss Brother    History   Social History  . Marital Status: Single    Spouse Name: N/A    Number of Children: N/A  . Years of Education: N/A   Occupational History  . Not on file.   Social History Main Topics  . Smoking status: Former Smoker -- 1.00 packs/day for 20 years    Quit date: 02/22/2013  . Smokeless tobacco: Not on file  . Alcohol Use: Yes     Comment: social  . Drug Use: No  . Sexual Activity: Not on file   Other Topics Concern  . Not on file   Social History Narrative  . No narrative on file    Review of Systems  Constitutional: Negative for fever, chills, diaphoresis, activity change, appetite change and fatigue.  HENT: Negative for ear pain, nosebleeds, congestion, facial swelling, rhinorrhea, neck  pain, neck stiffness and ear discharge.   Eyes: Negative for pain, discharge, redness, itching and visual disturbance.  Respiratory: Negative for cough, choking, chest tightness, shortness of breath, wheezing and stridor.   Cardiovascular: Negative for chest pain, palpitations and leg swelling.  Gastrointestinal: Negative for abdominal distention.  Genitourinary: Negative for dysuria, urgency, frequency, hematuria, flank pain, decreased urine volume, difficulty urinating and dyspareunia.  Musculoskeletal: Negative for back pain, joint swelling, arthralgias and gait problem.  Neurological: Negative for dizziness, tremors, seizures,  syncope, facial asymmetry, speech difficulty, weakness, light-headedness, numbness and headaches.  Hematological: Negative for adenopathy. Does not bruise/bleed easily.  Psychiatric/Behavioral: Negative for hallucinations, behavioral problems, confusion, dysphoric mood, decreased concentration and agitation.    Objective:   Filed Vitals:   07/16/13 1447  BP: 131/99  Pulse: 112  Temp: 98.9 F (37.2 C)  Resp: 14    Physical Exam  Constitutional: Appears well-developed and well-nourished. No distress.  HENT: Normocephalic. External right and left ear normal. Oropharynx is clear and moist.  Eyes: Conjunctivae and EOM are normal. PERRLA, no scleral icterus.  Neck: Normal ROM. Neck supple. No JVD. No tracheal deviation. No thyromegaly.  CVS: RRR, S1/S2 +, no murmurs, no gallops, no carotid bruit.  Pulmonary: Effort and breath sounds normal, no stridor, rhonchi, wheezes, rales.  Abdominal: Soft. BS +,  no distension, tenderness, rebound or guarding.  Musculoskeletal: Normal range of motion. No edema and no tenderness.  Lymphadenopathy: No lymphadenopathy noted, cervical, inguinal. Neuro: Alert. Normal reflexes, muscle tone coordination. No cranial nerve deficit. Skin: Skin is warm and dry. No rash noted. Not diaphoretic. No erythema. No pallor.  Psychiatric: Normal mood and affect. Behavior, judgment, thought content normal.   Lab Results  Component Value Date   WBC 7.9 07/10/2013   HGB 11.2* 07/10/2013   HCT 34.0* 07/10/2013   MCV 87.0 07/10/2013   PLT 207 07/10/2013   Lab Results  Component Value Date   CREATININE 0.83 07/10/2013   BUN 4* 07/10/2013   NA 142 07/10/2013   K 3.4* 07/10/2013   CL 109 07/10/2013   CO2 25 07/10/2013    No results found for this basename: HGBA1C   Lipid Panel  No results found for this basename: chol, trig, hdl, cholhdl, vldl, ldlcalc       Assessment and plan:   Patient Active Problem List   Diagnosis Date Noted  . Diarrhea 07/08/2013   . Routine general medical examination at a health care facility 07/08/2013  . Ileus 07/08/2013  . Migraine 07/08/2013  . Sleep apnea 07/08/2013  . UTI (urinary tract infection) 07/08/2013  . Ileus following gastrointestinal surgery 07/08/2013  . Dehydration 07/08/2013  . Acute appendicitis 06/06/2013  . IBS (irritable bowel syndrome) 06/06/2013  . GERD (gastroesophageal reflux disease) 06/06/2013  . HTN (hypertension) 06/06/2013       Hypertension continue hydrochlorothiazide  Gastroenteritis/irritable bowel syndrome Resolved Patient not interested in gastroenterology referral at this point  Hypokalemia hypomagnesemia Recheck labs in provide magnesium supplementation  The patient was given clear instructions to go to ER or return to medical center if symptoms don't improve, worsen or new problems develop. The patient verbalized understanding. The patient was told to call to get any lab results if not heard anything in the next week.

## 2013-07-16 NOTE — Progress Notes (Signed)
Pt is here form a f/u from the ED. Complains of feeling weak, controlled nausea.

## 2013-07-23 ENCOUNTER — Telehealth: Payer: Self-pay | Admitting: *Deleted

## 2013-07-23 NOTE — Telephone Encounter (Signed)
Message copied by Glenora Morocho, [place] R on Tue Jul 23, 2013  2:34 PM ------      Message from: Allyson Sabal MD, [facility]      Created: Tue Jul 23, 2013  2:22 PM       Patient the patient's labs are normal ------

## 2013-08-06 ENCOUNTER — Other Ambulatory Visit: Payer: Self-pay | Admitting: Internal Medicine

## 2013-08-20 ENCOUNTER — Ambulatory Visit (HOSPITAL_COMMUNITY)
Admission: RE | Admit: 2013-08-20 | Discharge: 2013-08-20 | Disposition: A | Discharge status: Home / Self Care | Payer: PRIVATE HEALTH INSURANCE | Source: Ambulatory Visit | Attending: Internal Medicine | Admitting: Internal Medicine

## 2013-08-20 ENCOUNTER — Telehealth: Payer: Self-pay | Admitting: Emergency Medicine

## 2013-08-20 ENCOUNTER — Encounter (HOSPITAL_COMMUNITY): Payer: Self-pay | Admitting: Emergency Medicine

## 2013-08-20 ENCOUNTER — Emergency Department (HOSPITAL_COMMUNITY)
Admission: EM | Admit: 2013-08-20 | Discharge: 2013-08-21 | Disposition: A | Discharge status: Home / Self Care | Payer: PRIVATE HEALTH INSURANCE | Attending: Emergency Medicine | Admitting: Emergency Medicine

## 2013-08-20 ENCOUNTER — Ambulatory Visit: Payer: PRIVATE HEALTH INSURANCE | Attending: Internal Medicine

## 2013-08-20 VITALS — BP 144/99 | HR 86 | Temp 97.7°F | Resp 16 | Ht 66.0 in | Wt 230.0 lb

## 2013-08-20 DIAGNOSIS — N390 Urinary tract infection, site not specified: Secondary | ICD-10-CM

## 2013-08-20 DIAGNOSIS — R109 Unspecified abdominal pain: Secondary | ICD-10-CM | POA: Insufficient documentation

## 2013-08-20 DIAGNOSIS — R140 Abdominal distension (gaseous): Secondary | ICD-10-CM

## 2013-08-20 DIAGNOSIS — R319 Hematuria, unspecified: Secondary | ICD-10-CM | POA: Insufficient documentation

## 2013-08-20 DIAGNOSIS — K589 Irritable bowel syndrome without diarrhea: Secondary | ICD-10-CM | POA: Insufficient documentation

## 2013-08-20 DIAGNOSIS — R110 Nausea: Secondary | ICD-10-CM | POA: Insufficient documentation

## 2013-08-20 DIAGNOSIS — Z3202 Encounter for pregnancy test, result negative: Secondary | ICD-10-CM | POA: Insufficient documentation

## 2013-08-20 DIAGNOSIS — Z9089 Acquired absence of other organs: Secondary | ICD-10-CM | POA: Insufficient documentation

## 2013-08-20 DIAGNOSIS — K219 Gastro-esophageal reflux disease without esophagitis: Secondary | ICD-10-CM | POA: Insufficient documentation

## 2013-08-20 DIAGNOSIS — Z79899 Other long term (current) drug therapy: Secondary | ICD-10-CM | POA: Insufficient documentation

## 2013-08-20 DIAGNOSIS — R143 Flatulence: Secondary | ICD-10-CM

## 2013-08-20 DIAGNOSIS — Z87891 Personal history of nicotine dependence: Secondary | ICD-10-CM | POA: Insufficient documentation

## 2013-08-20 DIAGNOSIS — R141 Gas pain: Secondary | ICD-10-CM | POA: Insufficient documentation

## 2013-08-20 DIAGNOSIS — I10 Essential (primary) hypertension: Secondary | ICD-10-CM | POA: Insufficient documentation

## 2013-08-20 DIAGNOSIS — R142 Eructation: Secondary | ICD-10-CM | POA: Insufficient documentation

## 2013-08-20 DIAGNOSIS — R1033 Periumbilical pain: Secondary | ICD-10-CM | POA: Insufficient documentation

## 2013-08-20 DIAGNOSIS — R1032 Left lower quadrant pain: Secondary | ICD-10-CM | POA: Insufficient documentation

## 2013-08-20 HISTORY — DX: Acquired absence of other specified parts of digestive tract: Z90.49

## 2013-08-20 LAB — CBC WITH DIFFERENTIAL/PLATELET
BASOS ABS: 0.1 10*3/uL (ref 0.0–0.1)
BASOS PCT: 1 % (ref 0–1)
Basophils Absolute: 0.1 10*3/uL (ref 0.0–0.1)
Basophils Relative: 1 % (ref 0–1)
EOS ABS: 0.3 10*3/uL (ref 0.0–0.7)
EOS PCT: 2 % (ref 0–5)
Eosinophils Absolute: 0.3 10*3/uL (ref 0.0–0.7)
Eosinophils Relative: 3 % (ref 0–5)
HEMATOCRIT: 36.8 % (ref 36.0–46.0)
HEMATOCRIT: 37.6 % (ref 36.0–46.0)
Hemoglobin: 12.3 g/dL (ref 12.0–15.0)
Hemoglobin: 12.8 g/dL (ref 12.0–15.0)
LYMPHS ABS: 3.7 10*3/uL (ref 0.7–4.0)
Lymphocytes Relative: 41 % (ref 12–46)
Lymphocytes Relative: 29 % (ref 12–46)
Lymphs Abs: 4.7 10*3/uL — ABNORMAL HIGH (ref 0.7–4.0)
MCH: 28.2 pg (ref 26.0–34.0)
MCH: 28.9 pg (ref 26.0–34.0)
MCHC: 33.4 g/dL (ref 30.0–36.0)
MCHC: 34 g/dL (ref 30.0–36.0)
MCV: 84.4 fL (ref 78.0–100.0)
MCV: 84.9 fL (ref 78.0–100.0)
MONO ABS: 0.9 10*3/uL (ref 0.1–1.0)
MONO ABS: 0.9 10*3/uL (ref 0.1–1.0)
Monocytes Relative: 8 % (ref 3–12)
Monocytes Relative: 7 % (ref 3–12)
Neutro Abs: 5.4 10*3/uL (ref 1.7–7.7)
Neutro Abs: 7.7 10*3/uL (ref 1.7–7.7)
Neutrophils Relative %: 47 % (ref 43–77)
Neutrophils Relative %: 61 % (ref 43–77)
Platelets: 329 10*3/uL (ref 150–400)
Platelets: 302 10*3/uL (ref 150–400)
RBC: 4.36 MIL/uL (ref 3.87–5.11)
RBC: 4.43 MIL/uL (ref 3.87–5.11)
RDW: 13.8 % (ref 11.5–15.5)
RDW: 13.6 % (ref 11.5–15.5)
WBC: 11.3 10*3/uL — ABNORMAL HIGH (ref 4.0–10.5)
WBC: 12.7 10*3/uL — ABNORMAL HIGH (ref 4.0–10.5)

## 2013-08-20 LAB — POCT URINALYSIS DIPSTICK
Bilirubin, UA: NEGATIVE
Blood, UA: NEGATIVE
Glucose, UA: NEGATIVE
KETONES UA: NEGATIVE
Nitrite, UA: NEGATIVE
PH UA: 6
Spec Grav, UA: >=1.03
Urobilinogen, UA: 0.2

## 2013-08-20 LAB — COMPREHENSIVE METABOLIC PANEL
ALBUMIN: 3.7 g/dL (ref 3.5–5.2)
ALT: 42 U/L — ABNORMAL HIGH (ref 0–35)
AST: 28 U/L (ref 0–37)
Alkaline Phosphatase: 56 U/L (ref 39–117)
BUN: 15 mg/dL (ref 6–23)
CALCIUM: 9.3 mg/dL (ref 8.4–10.5)
CO2: 25 meq/L (ref 19–32)
Chloride: 102 mEq/L (ref 96–112)
Creatinine, Ser: 0.83 mg/dL (ref 0.50–1.10)
GFR calc Af Amer: >90 mL/min (ref >90)
Glucose, Bld: 96 mg/dL (ref 70–99)
Potassium: 4.4 mEq/L (ref 3.7–5.3)
SODIUM: 141 meq/L (ref 137–147)
Total Bilirubin: <0.2 mg/dL — ABNORMAL LOW (ref 0.3–1.2)
Total Protein: 7.2 g/dL (ref 6.0–8.3)

## 2013-08-20 LAB — URINALYSIS, ROUTINE W REFLEX MICROSCOPIC
BILIRUBIN URINE: NEGATIVE
GLUCOSE, UA: NEGATIVE mg/dL
KETONES UR: NEGATIVE mg/dL
Leukocytes, UA: NEGATIVE
Nitrite: NEGATIVE
PH: 5.5 (ref 5.0–8.0)
PROTEIN: NEGATIVE mg/dL
Specific Gravity, Urine: 1.022 (ref 1.005–1.030)
Urobilinogen, UA: 0.2 mg/dL (ref 0.0–1.0)

## 2013-08-20 LAB — COMPLETE METABOLIC PANEL WITH GFR
ALK PHOS: 47 U/L (ref 39–117)
ALT: 37 U/L — ABNORMAL HIGH (ref 0–35)
AST: 24 U/L (ref 0–37)
Albumin: 4 g/dL (ref 3.5–5.2)
BUN: 15 mg/dL (ref 6–23)
CO2: 28 mEq/L (ref 19–32)
Calcium: 9.5 mg/dL (ref 8.4–10.5)
Chloride: 100 mEq/L (ref 96–112)
Creat: 0.88 mg/dL (ref 0.50–1.10)
GFR, Est African American: >89 mL/min
GFR, Est Non African American: 86 mL/min
Glucose, Bld: 89 mg/dL (ref 70–99)
Potassium: 4.3 mEq/L (ref 3.5–5.3)
Sodium: 135 mEq/L (ref 135–145)
Total Bilirubin: 0.3 mg/dL (ref 0.3–1.2)
Total Protein: 6.5 g/dL (ref 6.0–8.3)

## 2013-08-20 LAB — URINE MICROSCOPIC-ADD ON

## 2013-08-20 LAB — POCT PREGNANCY, URINE: Preg Test, Ur: NEGATIVE

## 2013-08-20 LAB — LIPASE: Lipase: 29 U/L (ref 0–75)

## 2013-08-20 LAB — LIPASE, BLOOD: Lipase: 36 U/L (ref 11–59)

## 2013-08-20 MED ORDER — IOHEXOL 300 MG/ML  SOLN
20.0000 mL | INTRAMUSCULAR | Status: AC
  Administered 2013-08-20: 25 mL via ORAL

## 2013-08-20 MED ORDER — ONDANSETRON HCL 4 MG/2ML IJ SOLN
4.0000 mg | Freq: Once | INTRAMUSCULAR | Status: AC
  Administered 2013-08-20: 4 mg via INTRAVENOUS
  Filled 2013-08-20: qty 2

## 2013-08-20 MED ORDER — MORPHINE SULFATE 4 MG/ML IJ SOLN
4.0000 mg | Freq: Once | INTRAMUSCULAR | Status: AC
  Administered 2013-08-20: 4 mg via INTRAVENOUS
  Filled 2013-08-20: qty 1

## 2013-08-20 NOTE — Progress Notes (Unsigned)
Patient returned today at approx 5 :30 with distended abd Having decreased urination Instructed to go right to the ED for evaluation

## 2013-08-20 NOTE — Telephone Encounter (Signed)
Message copied by Ricci Barker on Tue Aug 20, 2013  3:21 PM ------      Message from: Tresa Garter      Created: Tue Aug 20, 2013  3:04 PM       Please inform patient that her urinalysis is normal ------

## 2013-08-20 NOTE — Progress Notes (Unsigned)
Pt here with c/o poss UTI sx that started Sunday with lower pelvic,localized pain,pressure and burning with urination Denies n/v or fevers LMP- 4/14 taking oral BC BP 144/99 prescribed HCTZ 12.5 mg

## 2013-08-20 NOTE — ED Notes (Signed)
Pt c/o lower abd pain, abd swelling, nausea, pain with urinating, decrease urine output starting Sunday. Pt seen at her PCP today urine was negative, pt states she had an appendectomy in November and an ileus in December. Pt states her symptoms feels her symptoms are identical to her ileus. She is scheduled to have an [place] on Friday. Pt reports a 15 lb weight gain x1 month

## 2013-08-20 NOTE — Patient Instructions (Addendum)
Pt instructed to get imaging done asap and return for f/u to discuss results per Dr. Allyson Sabal

## 2013-08-20 NOTE — ED Provider Notes (Signed)
CSN: [id number]     Arrival date & time 08/20/13  1750 History   First MD Initiated Contact with Patient 08/20/13 2134     Chief Complaint  Patient presents with  . Abdominal Pain   (Consider location/radiation/quality/duration/timing/severity/associated sxs/prior Treatment) The history is provided by the patient and medical records.   This is a 33 year old female with past medical history significant for hypertension, GERD, IBS, migraine headaches, presenting to the ED for abdominal pain, abdominal distention, nausea for the past 3 days. Patient states she began having lower abdominal pain on Sunday, which she thought was due to her urinary tract infection as she has had some dysuria and decreased urine output.  States then pain has intensified, and her abdomen feels more distended. She has been having normal bowel movements and is able to pass flatus. Patient was seen by her primary care physician earlier today had a urinalysis and abdominal x-ray performed, both of which negative. Patient has had a prior appendectomy in November 2014 with a ileus following in December 2014. She states her symptoms today feel identical to her prior ileus.  No fevers, sweats, or chills.  Pt states on arrival to the ED, she began experiencing some hematuria which is new.  VS stable on arrival.  Past Medical History  Diagnosis Date  . Hypertension   . GERD (gastroesophageal reflux disease)   . IBS (irritable bowel syndrome)   . Migraine headache    Past Surgical History  Procedure Laterality Date  . Dental surgery    . Laparoscopic appendectomy N/A 06/06/2013    Procedure: APPENDECTOMY LAPAROSCOPIC;  Surgeon: Imogene Burn. Georgette Dover, MD;  Location: [place];  Service: General;  Laterality: N/A;  . Appendectomy     Family History  Problem Relation Age of Onset  . COPD Mother   . Hypertension Mother   . Cancer Father   . Diabetes Father   . Hypertension Father   . Heart disease Father   . Hyperlipidemia Father    . Asthma Sister   . Hyperlipidemia Brother   . Hypertension Brother   . Vision loss Brother    History  Substance Use Topics  . Smoking status: Former Smoker -- 1.00 packs/day for 20 years    Quit date: 02/22/2013  . Smokeless tobacco: Not on file  . Alcohol Use: Yes     Comment: social   OB History   Grav Para Term Preterm Abortions TAB SAB Ect Mult Living                 Review of Systems  Gastrointestinal: Positive for nausea, abdominal pain and abdominal distention.  Genitourinary: Positive for dysuria and hematuria.  All other systems reviewed and are negative.    Allergies  Codeine  Home Medications   Current Outpatient Rx  Name  Route  Sig  Dispense  Refill  . dicyclomine (BENTYL) 10 MG capsule   Oral   Take 1 capsule (10 mg total) by mouth 2 (two) times daily as needed for spasms (abdominal cramps, IBS).   60 capsule   3   . hydrochlorothiazide (HYDRODIURIL) 12.5 MG tablet   Oral   Take 1 tablet (12.5 mg total) by mouth daily.   30 tablet   5   . ibuprofen (ADVIL,MOTRIN) 600 MG tablet   Oral   Take 600 mg by mouth every 6 (six) hours as needed.         . Magnesium 300 MG CAPS   Oral   Take  1 capsule by mouth every morning.         . norgestimate-ethinyl estradiol (ORTHO-CYCLEN,SPRINTEC,PREVIFEM) 0.25-35 MG-MCG tablet   Oral   Take 1 tablet by mouth every evening.         Marland Kitchen omeprazole (PRILOSEC) 20 MG capsule      TAKE 1 CAPSULE BY MOUTH DAILY   30 capsule   0   . Prenatal Vit-Fe Sulfate-FA (PRENATAL VITAMIN PO)   Oral   Take 1 tablet by mouth every evening.          . Probiotic Product (PROBIOTIC DAILY PO)   Oral   Take 500 mg by mouth every evening.          . promethazine (PHENERGAN) 12.5 MG tablet   Oral   Take 2 tablets (25 mg total) by mouth every 6 (six) hours as needed for nausea.   30 tablet   0    BP 147/97  Pulse 79  Temp(Src) 98 F (36.7 C) (Oral)  Resp 18  Ht 5' 6"  (1.676 m)  Wt 228 lb 6.4 oz (103.602  kg)  BMI 36.88 kg/m2  SpO2 96%  Physical Exam  Nursing note and vitals reviewed. Constitutional: She is oriented to person, place, and time. She appears well-developed and well-nourished. No distress.  HENT:  Head: Normocephalic and atraumatic.  Mouth/Throat: Oropharynx is clear and moist.  Eyes: Conjunctivae and EOM are normal. Pupils are equal, round, and reactive to light.  Neck: Normal range of motion. Neck supple.  Cardiovascular: Normal rate, regular rhythm and normal heart sounds.   Pulmonary/Chest: Effort normal and breath sounds normal. No respiratory distress. She has no wheezes.  Abdominal: Soft. Bowel sounds are normal. She exhibits distension. There is tenderness in the periumbilical area and left lower quadrant. There is no rigidity.  Abdomen appears somewhat distended; TTP periumbilical and LLQ  Musculoskeletal: Normal range of motion.  Neurological: She is alert and oriented to person, place, and time.  Skin: Skin is warm and dry. She is not diaphoretic.  Psychiatric: She has a normal mood and affect.    ED Course  Procedures (including critical care time) Labs Review Labs Reviewed  COMPREHENSIVE METABOLIC PANEL - Abnormal; Notable for the following:    ALT 42 (*)    Total Bilirubin <0.2 (*)    All other components within normal limits  CBC WITH DIFFERENTIAL - Abnormal; Notable for the following:    WBC 12.7 (*)    All other components within normal limits  URINALYSIS, ROUTINE W REFLEX MICROSCOPIC - Abnormal; Notable for the following:    Hgb urine dipstick MODERATE (*)    All other components within normal limits  URINE MICROSCOPIC-ADD ON - Abnormal; Notable for the following:    Squamous Epithelial / LPF FEW (*)    Bacteria, UA FEW (*)    All other components within normal limits  LIPASE, BLOOD  POCT PREGNANCY, URINE   Imaging Review Dg Abd 1 View  08/20/2013   CLINICAL DATA:  Pain.  EXAM: ABDOMEN - 1 VIEW  COMPARISON:  07/10/2013.  FINDINGS: Soft tissue  structures are unremarkable. The gas pattern is nonspecific. Stool is present throughout the colon. No focal bony abnormality. Lung bases are clear.  IMPRESSION: No focal abnormality.   Electronically Signed   By: Marcello Moores  Register   On: 08/20/2013 13:52   Ct Abdomen Pelvis W Contrast  08/21/2013   CLINICAL DATA:  Abdominal distention, nausea, vomiting  EXAM: CT ABDOMEN AND PELVIS WITH CONTRAST  TECHNIQUE:  Multidetector CT imaging of the abdomen and pelvis was performed using the standard protocol following bolus administration of intravenous contrast.  CONTRAST:  172m OMNIPAQUE IOHEXOL 300 MG/ML  SOLN  COMPARISON:  07/08/2013  FINDINGS: Lung bases clear. Normal heart size. No pericardial or pleural effusion.  Abdomen: Diffuse fatty infiltration of the enlarged liver as before. Scattered minor areas of fatty sparing noted along the gallbladder fossa. Patent portal veins are patent. No biliary dilatation or definite focal hepatic abnormality. Gallbladder, biliary system, pancreas, spleen, adrenals, kidneys are within normal limits for age and demonstrate no acute finding. Incidental punctate nonobstructing right intrarenal calculus in the lower pole.  Negative for bowel obstruction, ileus, or free air. Mild nonspecific distention of the jejunum in the upper abdomen. Prior appendectomy noted.  No abdominal free fluid, fluid collection, hemorrhage, abscess, or adenopathy.  Negative for aneurysm.  Pelvis: Uterus and adnexa are normal in size. Urinary bladder unremarkable. No pelvic free fluid, fluid collection, hemorrhage, abscess, adenopathy, inguinal abnormality, or hernia.  Minor degenerative changes of the spine and SI joints.  IMPRESSION: No acute intra-abdominal or pelvic finding.  Hepatic steatosis  Punctate nonobstructing right intrarenal calculus  Prior appendectomy  Stable exam   Electronically Signed   By: TDaryll BrodM.D.   On: 08/21/2013 00:59    EKG Interpretation   None       MDM   1.  Abdominal distension   2. Abdominal pain   3. Nausea    Labs as above, mild leukocytosis at 12.7. UA with moderate blood but no signs of infection. CT abdomen pelvis negative for acute findings. Patient's abdomen still appears somewhat distended, however her pain is well-controlled and the ED and she has had no vomiting. Pt afebrile, non-toxic appearing, NAD, VS stable- ok for discharge.  She will be referred to GI for further evaluation.  Rx percocet and zofran.  Discussed plan with pt, she agreed.  Return precautions advised.  LLarene Pickett PA-C 08/21/13 [phone]

## 2013-08-21 ENCOUNTER — Emergency Department (HOSPITAL_COMMUNITY): Payer: PRIVATE HEALTH INSURANCE

## 2013-08-21 ENCOUNTER — Encounter (HOSPITAL_COMMUNITY): Payer: Self-pay | Admitting: Radiology

## 2013-08-21 ENCOUNTER — Encounter: Payer: Self-pay | Admitting: Gastroenterology

## 2013-08-21 ENCOUNTER — Telehealth: Payer: Self-pay | Admitting: *Deleted

## 2013-08-21 MED ORDER — OXYCODONE-ACETAMINOPHEN 5-325 MG PO TABS: 1.0000 | ORAL_TABLET | ORAL | Status: DC | PRN

## 2013-08-21 MED ORDER — IOHEXOL 300 MG/ML  SOLN
100.0000 mL | Freq: Once | INTRAMUSCULAR | Status: AC | PRN
  Administered 2013-08-21: 100 mL via INTRAVENOUS

## 2013-08-21 MED ORDER — CIPROFLOXACIN HCL 500 MG PO TABS: 500.0000 mg | ORAL_TABLET | Freq: Two times a day (BID) | ORAL | Status: DC

## 2013-08-21 MED ORDER — ONDANSETRON 4 MG PO TBDP: 4.0000 mg | ORAL_TABLET | Freq: Three times a day (TID) | ORAL | Status: DC | PRN

## 2013-08-21 NOTE — Telephone Encounter (Signed)
Message copied by Sakura Denis, [place] R on Wed Aug 21, 2013  2:10 PM ------      Message from: Allyson Sabal MD, [facility]      Created: Wed Aug 21, 2013 10:30 AM       Notify patient that she has a mild urinary tract infection, prescription [facility] ciprofloxacin [facility], rest of the labs are normal ------

## 2013-08-21 NOTE — Discharge Instructions (Signed)
Take the prescribed medication as directed. Follow-up with [facility] for further evaluation-- call their office in the morning to scheduled appt. Return to the ED for new or worsening symptoms.

## 2013-08-21 NOTE — ED Notes (Signed)
Pt back from CT

## 2013-08-21 NOTE — ED Notes (Signed)
Pt taken to CT.

## 2013-08-21 NOTE — Addendum Note (Signed)
Addended by: Allyson Sabal MD, [facility] on: 08/21/2013 10:29 AM   Modules accepted: Orders

## 2013-08-22 NOTE — ED Provider Notes (Signed)
Medical screening examination/treatment/procedure(s) were performed by non-physician practitioner and as supervising physician I was immediately available for consultation/collaboration.  Richarda Blade, MD 08/22/13 [phone]

## 2013-08-23 ENCOUNTER — Ambulatory Visit (HOSPITAL_COMMUNITY): Payer: PRIVATE HEALTH INSURANCE

## 2013-08-29 ENCOUNTER — Other Ambulatory Visit: Payer: Self-pay | Admitting: Internal Medicine

## 2013-09-13 ENCOUNTER — Ambulatory Visit (INDEPENDENT_AMBULATORY_CARE_PROVIDER_SITE_OTHER): Payer: No Typology Code available for payment source | Admitting: Gastroenterology

## 2013-09-13 ENCOUNTER — Other Ambulatory Visit: Payer: Self-pay

## 2013-09-13 ENCOUNTER — Other Ambulatory Visit (INDEPENDENT_AMBULATORY_CARE_PROVIDER_SITE_OTHER): Payer: No Typology Code available for payment source

## 2013-09-13 ENCOUNTER — Encounter: Payer: Self-pay | Admitting: Gastroenterology

## 2013-09-13 VITALS — BP 130/92 | HR 78 | Ht 66.0 in | Wt 228.0 lb

## 2013-09-13 DIAGNOSIS — R197 Diarrhea, unspecified: Secondary | ICD-10-CM

## 2013-09-13 DIAGNOSIS — K219 Gastro-esophageal reflux disease without esophagitis: Secondary | ICD-10-CM

## 2013-09-13 DIAGNOSIS — K589 Irritable bowel syndrome without diarrhea: Secondary | ICD-10-CM

## 2013-09-13 LAB — IGA: IgA: 158 mg/dL (ref 68–378)

## 2013-09-13 MED ORDER — DICYCLOMINE HCL 10 MG PO CAPS: 10.0000 mg | ORAL_CAPSULE | Freq: Three times a day (TID) | ORAL | Status: DC

## 2013-09-13 MED ORDER — SOD PICOSULFATE-MAG OX-CIT ACD 10-3.5-12 MG-GM-GM PO PACK: 1.0000 | PACK | ORAL | Status: DC

## 2013-09-13 NOTE — Patient Instructions (Addendum)
Your physician has requested that you go to the basement for the following lab work before leaving today:TTG, IGA.  Change taking your Bentyl to four times a day before meals.   You have been scheduled for a colonoscopy with propofol. Please follow written instructions given to you at your visit today.  Please pick up your prep kit at the pharmacy within the next 1-3 days. If you use inhalers (even only as needed), please bring them with you on the day of your procedure. Your physician has requested that you go to www.startemmi.com and enter the access code given to you at your visit today. This web site gives a general overview about your procedure. However, you should still follow specific instructions given to you by our office regarding your preparation for the procedure.  Patient advised to avoid spicy, acidic, citrus, chocolate, mints, fruit and fruit juices.  Limit the intake of caffeine, alcohol and Soda.  Don't exercise too soon after eating.  Don't lie down within 3-4 hours of eating.  Elevate the head of your bed.  Thank you for choosing me and [facility].  Pricilla Riffle. Dagoberto Ligas., MD., [facility]

## 2013-09-13 NOTE — Progress Notes (Signed)
    History of Present Illness: This is a 33 year old female with a history of irritable bowel syndrome for several years characterized by frequent bowel movements, occasional diarrhea, frequent abdominal bloating and cramping. She underwent appendectomy in November and was hospitalized for presumed viral gastroenteritis in December. Since that time her irritable bowel syndrome has been more active with frequent cramping bloating and more frequent bowel movements. Stool testing was negative during her recent hospitalization. Her symptoms have improved with the regular use of a daily probiotic and Bentyl twice a day as needed. CT scan of the abdomen/pelvis in January showed hepatic steatosis, nonobstructing right intrarenal calculus and prior appendectomy. She also has GERD that is very well controlled on daily Prilosec. In frequent breakthrough symptoms. Denies weight loss, constipation, change in stool caliber, melena, hematochezia, nausea, vomiting, dysphagia, chest pain.    Review of Systems: Pertinent positive and negative review of systems were noted in the above HPI section. All other review of systems were otherwise negative.  Current Medications, Allergies, Past Medical History, Past Surgical History, Family History and Social History were reviewed in [employer] record.  Physical Exam: General: Well developed , well nourished, overweight, no acute distress Head: Normocephalic and atraumatic Eyes:  sclerae anicteric, EOMI Ears: Normal auditory acuity Mouth: No deformity or lesions Neck: Supple, no masses or thyromegaly Lungs: Clear throughout to auscultation Heart: Regular rate and rhythm; no murmurs, rubs or bruits Abdomen: Soft, non tender and non distended. No masses, hepatosplenomegaly or hernias noted. Normal Bowel sounds Rectal: Deferred to colonoscopy Musculoskeletal: Symmetrical with no gross deformities  Skin: No lesions on visible extremities Pulses:   Normal pulses noted Extremities: No clubbing, cyanosis, edema or deformities noted Neurological: Alert oriented x 4, grossly nonfocal Cervical Nodes:  No significant cervical adenopathy Inguinal Nodes: No significant inguinal adenopathy Psychological:  Alert and cooperative. Normal mood and affect  Assessment and Recommendations:  1. Diarrhea, crampy abdominal pain and bloating. Presumed IBS. Lactose intolerance. Continue daily probiotic. Avoid lactose and other stressor foods and beverages. Increase Bentyl to 10 mg 4 times a day when necessary. Discontinue magnesium supplements as this may cause or exacerbate diarrhea. tTG and IgA today. Schedule colonoscopy to rule out the less likely possibility of IBD. The risks, benefits, and alternatives to colonoscopy with possible biopsy and possible polypectomy were discussed with the patient and they consent to proceed.   2. GERD. Standard antireflux measures. Continue omeprazole 20 mg daily.  3. Substantial weight gain, which is mainly abdominal. This may be contributing to some of her abdominal complaints. Further evaluation by her PCP.

## 2013-09-16 LAB — TISSUE TRANSGLUTAMINASE, IGA: Tissue Transglutaminase Ab, IgA: 5.8 U/mL (ref <20)

## 2013-09-17 ENCOUNTER — Ambulatory Visit: Payer: No Typology Code available for payment source | Admitting: Internal Medicine

## 2013-09-17 ENCOUNTER — Encounter: Payer: Self-pay | Admitting: Internal Medicine

## 2013-09-17 NOTE — Progress Notes (Signed)
Pt here for blood pressure recheck after being seen by GI. States bp was elevated 140/90 with medication BP today 138/95 73 Pt scheduled for colonoscopy 11/11/13 C/o 30 lb weight gain since Sept 2014 Pt also requesting 3rd series Hep B

## 2013-09-18 ENCOUNTER — Encounter: Payer: Self-pay | Admitting: Internal Medicine

## 2013-09-18 ENCOUNTER — Ambulatory Visit: Payer: No Typology Code available for payment source | Attending: Internal Medicine | Admitting: Internal Medicine

## 2013-09-18 VITALS — BP 126/84 | HR 121 | Temp 98.5°F | Resp 14 | Ht 66.0 in | Wt 234.0 lb

## 2013-09-18 DIAGNOSIS — R609 Edema, unspecified: Secondary | ICD-10-CM | POA: Insufficient documentation

## 2013-09-18 DIAGNOSIS — I10 Essential (primary) hypertension: Secondary | ICD-10-CM | POA: Insufficient documentation

## 2013-09-18 DIAGNOSIS — K219 Gastro-esophageal reflux disease without esophagitis: Secondary | ICD-10-CM | POA: Insufficient documentation

## 2013-09-18 DIAGNOSIS — Z87891 Personal history of nicotine dependence: Secondary | ICD-10-CM | POA: Insufficient documentation

## 2013-09-18 DIAGNOSIS — R0602 Shortness of breath: Secondary | ICD-10-CM | POA: Insufficient documentation

## 2013-09-18 DIAGNOSIS — G4730 Sleep apnea, unspecified: Secondary | ICD-10-CM | POA: Insufficient documentation

## 2013-09-18 LAB — D-DIMER, QUANTITATIVE (NOT AT ARMC): D DIMER QUANT: 0.27 ug{FEU}/mL (ref 0.00–0.48)

## 2013-09-18 MED ORDER — LISINOPRIL 10 MG PO TABS: 10.0000 mg | ORAL_TABLET | Freq: Every day | ORAL | Status: DC

## 2013-09-18 MED ORDER — FUROSEMIDE 40 MG PO TABS: 40.0000 mg | ORAL_TABLET | Freq: Every day | ORAL | Status: DC

## 2013-09-18 MED ORDER — POTASSIUM CHLORIDE CRYS ER 10 MEQ PO TBCR: 10.0000 meq | EXTENDED_RELEASE_TABLET | Freq: Two times a day (BID) | ORAL | Status: DC

## 2013-09-18 MED ORDER — FUROSEMIDE 40 MG PO TABS: 40.0000 mg | ORAL_TABLET | Freq: Every morning | ORAL | Status: DC

## 2013-09-18 NOTE — Progress Notes (Signed)
Patient is here for hypertension and weight gain. BP has been fluncuating lately and has had some weight gain.

## 2013-09-18 NOTE — Progress Notes (Signed)
Patient ID: Stephanie Miles, female   DOB: 1980/11/18, 33 y.o.   MRN: [id number]   CC:  HPI: Morbidly obese 33 year old female with a history of hypertension, stated to have a colonoscopy in April and sent to the clinic for evaluation of high blood pressure. The patient appears to be tachycardic with heart rate greater than 100. Recently she went to [place] a state park in [place] and had bilateral lower extremity edema when she returned. She complains of feeling shortness of breath with exertion. She is unable to walk to the bus stop. She does have dependent edema in her legs today. She has sleep apnea and uses a CPAP machine. She denies any chest pain. Denies any history of asthma. She quit smoking in August.  Allergies  Allergen Reactions  . Codeine Nausea Only   Past Medical History  Diagnosis Date  . Hypertension   . GERD (gastroesophageal reflux disease)   . IBS (irritable bowel syndrome)   . Migraine headache   . S/P appy 05/2013   Current Outpatient Prescriptions on File Prior to Visit  Medication Sig Dispense Refill  . dicyclomine (BENTYL) 10 MG capsule Take 1 capsule (10 mg total) by mouth 4 (four) times daily -  before meals and at bedtime.  120 capsule  11  . ibuprofen (ADVIL,MOTRIN) 600 MG tablet Take 600 mg by mouth every 6 (six) hours as needed.      . norgestimate-ethinyl estradiol (ORTHO-CYCLEN,SPRINTEC,PREVIFEM) 0.25-35 MG-MCG tablet Take 1 tablet by mouth every evening.      Marland Kitchen omeprazole (PRILOSEC) 20 MG capsule TAKE 1 CAPSULE BY MOUTH DAILY  30 capsule  0  . ondansetron (ZOFRAN ODT) 4 MG disintegrating tablet Take 1 tablet (4 mg total) by mouth every 8 (eight) hours as needed for nausea.  10 tablet  0  . oxyCODONE-acetaminophen (PERCOCET/ROXICET) 5-325 MG per tablet Take 1 tablet by mouth every 4 (four) hours as needed.  15 tablet  0  . Prenatal Vit-Fe Sulfate-FA (PRENATAL VITAMIN PO) Take 1 tablet by mouth every evening.       . Probiotic Product (PROBIOTIC DAILY  PO) Take 500 mg by mouth every evening.       . promethazine (PHENERGAN) 12.5 MG tablet Take 2 tablets (25 mg total) by mouth every 6 (six) hours as needed for nausea.  30 tablet  0  . Sod Picosulfate-Mag Ox-Cit Acd ([place]) 10-3.5-12 MG-GM-GM PACK Take 1 kit by mouth as directed.  1 each  0  . Magnesium 300 MG CAPS Take 1 capsule by mouth every morning.       No current facility-administered medications on file prior to visit.   Family History  Problem Relation Age of Onset  . COPD Mother   . Hypertension Mother   . Cancer Father   . Diabetes Father   . Hypertension Father   . Heart disease Father   . Hyperlipidemia Father   . Asthma Sister   . Hyperlipidemia Brother   . Hypertension Brother   . Vision loss Brother    History   Social History  . Marital Status: Single    Spouse Name: N/A    Number of Children: N/A  . Years of Education: N/A   Occupational History  . Not on file.   Social History Main Topics  . Smoking status: Former Smoker -- 1.00 packs/day for 20 years    Quit date: 02/22/2013  . Smokeless tobacco: Never Used  . Alcohol Use: Yes     Comment:  social  . Drug Use: No  . Sexual Activity: Not on file   Other Topics Concern  . Not on file   Social History Narrative  . No narrative on file    Review of Systems  Constitutional: Negative for fever, chills, diaphoresis, activity change, appetite change and fatigue.  HENT: Negative for ear pain, nosebleeds, congestion, facial swelling, rhinorrhea, neck pain, neck stiffness and ear discharge.   Eyes: Negative for pain, discharge, redness, itching and visual disturbance.  Respiratory: As in history of present illness Cardiovascular: Negative for chest pain, palpitations and leg swelling.  Gastrointestinal: Negative for abdominal distention.  Genitourinary: Negative for dysuria, urgency, frequency, hematuria, flank pain, decreased urine volume, difficulty urinating and dyspareunia.  Musculoskeletal:  Negative for back pain, joint swelling, arthralgias and gait problem.  Neurological: Negative for dizziness, tremors, seizures, syncope, facial asymmetry, speech difficulty, weakness, light-headedness, numbness and headaches.  Hematological: Negative for adenopathy. Does not bruise/bleed easily.  Psychiatric/Behavioral: Negative for hallucinations, behavioral problems, confusion, dysphoric mood, decreased concentration and agitation.    Objective:   Filed Vitals:   09/18/13 1449  BP: 126/84  Pulse: 121  Temp: 98.5 F (36.9 C)  Resp: 14    Physical Exam  Constitutional: Appears well-developed and well-nourished. No distress.  HENT: Normocephalic. External right and left ear normal. Oropharynx is clear and moist.  Eyes: Conjunctivae and EOM are normal. PERRLA, no scleral icterus.  Neck: Normal ROM. Neck supple. No JVD. No tracheal deviation. No thyromegaly.  CVS: RRR, S1/S2 +, no murmurs, no gallops, no carotid bruit.  Pulmonary: Effort and breath sounds normal, no stridor, rhonchi, wheezes, rales.  Abdominal: Soft. BS +,  no distension, tenderness, rebound or guarding.  Musculoskeletal: Normal range of motion. 1+ pitting edema and no tenderness.  Lymphadenopathy: No lymphadenopathy noted, cervical, inguinal. Neuro: Alert. Normal reflexes, muscle tone coordination. No cranial nerve deficit. Skin: Skin is warm and dry. No rash noted. Not diaphoretic. No erythema. No pallor.  Psychiatric: Normal mood and affect. Behavior, judgment, thought content normal.   Lab Results  Component Value Date   WBC 12.7* 08/20/2013   HGB 12.8 08/20/2013   HCT 37.6 08/20/2013   MCV 84.9 08/20/2013   PLT 302 08/20/2013   Lab Results  Component Value Date   CREATININE 0.83 08/20/2013   BUN 15 08/20/2013   NA 141 08/20/2013   K 4.4 08/20/2013   CL 102 08/20/2013   CO2 25 08/20/2013    No results found for this basename: HGBA1C   Lipid Panel  No results found for this basename: chol, trig, hdl, cholhdl,  vldl, ldlcalc       Assessment and plan:   Patient Active Problem List   Diagnosis Date Noted  . Diarrhea 07/08/2013  . Routine general medical examination at a health care facility 07/08/2013  . Ileus 07/08/2013  . Migraine 07/08/2013  . Sleep apnea 07/08/2013  . UTI (urinary tract infection) 07/08/2013  . Ileus following gastrointestinal surgery 07/08/2013  . Dehydration 07/08/2013  . Acute appendicitis 06/06/2013  . IBS (irritable bowel syndrome) 06/06/2013  . GERD (gastroesophageal reflux disease) 06/06/2013  . HTN (hypertension) 06/06/2013   Shortness of breath Patient has sleep apnea, she is morbidly obese could have is obesity hypoventilation syndrome. She has hypertension could have underlying chronic diastolic heart failure Change medication to lisinopril 10 mg and Lasix 40 mg a day    Schedule patient for a 2-D echo   Hypertension Change lisinopril/Lasix Start the patient understands and supplementation Renal panel today  Followup with 6 weeks   The patient was given clear instructions to go to ER or return to medical center if symptoms don't improve, worsen or new problems develop. The patient verbalized understanding. The patient was told to call to get any lab results if not heard anything in the next week.

## 2013-09-19 NOTE — Progress Notes (Signed)
This encounter was created in error - please disregard.

## 2013-09-24 ENCOUNTER — Telehealth: Payer: Self-pay | Admitting: *Deleted

## 2013-09-24 NOTE — Telephone Encounter (Signed)
Call completed as followed.

## 2013-09-24 NOTE — Telephone Encounter (Signed)
Message copied by Hyatt Capobianco, [place] R on Tue Sep 24, 2013  9:37 AM ------      Message from: Allyson Sabal MD, [facility]      Created: Mon Sep 23, 2013  1:47 PM       Notify patient had d-dimer is negative. Please cancel patient's Doppler ------

## 2013-09-27 ENCOUNTER — Ambulatory Visit (HOSPITAL_COMMUNITY)
Admission: RE | Admit: 2013-09-27 | Discharge: 2013-09-27 | Disposition: A | Discharge status: Home / Self Care | Payer: No Typology Code available for payment source | Source: Ambulatory Visit | Attending: Internal Medicine | Admitting: Internal Medicine

## 2013-09-27 DIAGNOSIS — R0602 Shortness of breath: Secondary | ICD-10-CM

## 2013-09-27 DIAGNOSIS — Z87891 Personal history of nicotine dependence: Secondary | ICD-10-CM | POA: Insufficient documentation

## 2013-09-27 DIAGNOSIS — I10 Essential (primary) hypertension: Secondary | ICD-10-CM | POA: Insufficient documentation

## 2013-09-27 DIAGNOSIS — I359 Nonrheumatic aortic valve disorder, unspecified: Secondary | ICD-10-CM

## 2013-09-27 DIAGNOSIS — R609 Edema, unspecified: Secondary | ICD-10-CM

## 2013-09-27 NOTE — Progress Notes (Signed)
  Echocardiogram 2D Echocardiogram has been performed.  Stephanie Miles FRANCES 09/27/2013, 1:43 PM

## 2013-09-27 NOTE — Progress Notes (Signed)
VASCULAR LAB PRELIMINARY  PRELIMINARY  PRELIMINARY  PRELIMINARY  Bilateral lower extremity venous duplex completed.    Preliminary report:  Negative for deep and superficial vein thrombosis bilaterally.  Mister Krahenbuhl, RVT 09/27/2013, 2:19 PM

## 2013-09-30 ENCOUNTER — Telehealth: Payer: Self-pay | Admitting: Emergency Medicine

## 2013-09-30 NOTE — Telephone Encounter (Signed)
Left message with normal Echo results

## 2013-09-30 NOTE — Telephone Encounter (Signed)
Message copied by Ricci Barker on Mon Sep 30, 2013  4:39 PM ------      Message from: Allyson Sabal MD, Ascencion Dike      Created: Mon Sep 30, 2013  1:36 PM       2-D echo is negative, all findings within normal limits, please notify patient ------

## 2013-10-01 ENCOUNTER — Emergency Department (HOSPITAL_COMMUNITY)
Admission: EM | Admit: 2013-10-01 | Discharge: 2013-10-01 | Disposition: A | Discharge status: Home / Self Care | Payer: No Typology Code available for payment source | Attending: Emergency Medicine | Admitting: Emergency Medicine

## 2013-10-01 ENCOUNTER — Other Ambulatory Visit: Payer: Self-pay | Admitting: Internal Medicine

## 2013-10-01 DIAGNOSIS — R109 Unspecified abdominal pain: Secondary | ICD-10-CM

## 2013-10-01 DIAGNOSIS — Z79899 Other long term (current) drug therapy: Secondary | ICD-10-CM | POA: Insufficient documentation

## 2013-10-01 DIAGNOSIS — Z9089 Acquired absence of other organs: Secondary | ICD-10-CM | POA: Insufficient documentation

## 2013-10-01 DIAGNOSIS — R519 Headache, unspecified: Secondary | ICD-10-CM

## 2013-10-01 DIAGNOSIS — R6883 Chills (without fever): Secondary | ICD-10-CM | POA: Insufficient documentation

## 2013-10-01 DIAGNOSIS — IMO0001 Reserved for inherently not codable concepts without codable children: Secondary | ICD-10-CM | POA: Insufficient documentation

## 2013-10-01 DIAGNOSIS — Z3202 Encounter for pregnancy test, result negative: Secondary | ICD-10-CM | POA: Insufficient documentation

## 2013-10-01 DIAGNOSIS — R51 Headache: Secondary | ICD-10-CM | POA: Insufficient documentation

## 2013-10-01 DIAGNOSIS — K219 Gastro-esophageal reflux disease without esophagitis: Secondary | ICD-10-CM | POA: Insufficient documentation

## 2013-10-01 DIAGNOSIS — I10 Essential (primary) hypertension: Secondary | ICD-10-CM | POA: Insufficient documentation

## 2013-10-01 DIAGNOSIS — K589 Irritable bowel syndrome without diarrhea: Secondary | ICD-10-CM | POA: Insufficient documentation

## 2013-10-01 DIAGNOSIS — Z87891 Personal history of nicotine dependence: Secondary | ICD-10-CM | POA: Insufficient documentation

## 2013-10-01 DIAGNOSIS — R112 Nausea with vomiting, unspecified: Secondary | ICD-10-CM

## 2013-10-01 DIAGNOSIS — Z791 Long term (current) use of non-steroidal anti-inflammatories (NSAID): Secondary | ICD-10-CM | POA: Insufficient documentation

## 2013-10-01 LAB — BASIC METABOLIC PANEL
BUN: 15 mg/dL (ref 6–23)
CHLORIDE: 102 meq/L (ref 96–112)
CO2: 24 meq/L (ref 19–32)
CREATININE: 0.9 mg/dL (ref 0.50–1.10)
Calcium: 9.4 mg/dL (ref 8.4–10.5)
GFR calc Af Amer: >90 mL/min (ref >90)
GFR calc non Af Amer: 82 mL/min — ABNORMAL LOW (ref >90)
GLUCOSE: 98 mg/dL (ref 70–99)
POTASSIUM: 4.4 meq/L (ref 3.7–5.3)
Sodium: 141 mEq/L (ref 137–147)

## 2013-10-01 LAB — CBC WITH DIFFERENTIAL/PLATELET
Basophils Absolute: 0.1 10*3/uL (ref 0.0–0.1)
Basophils Relative: 1 % (ref 0–1)
Eosinophils Absolute: 0.3 10*3/uL (ref 0.0–0.7)
Eosinophils Relative: 3 % (ref 0–5)
HCT: 42.4 % (ref 36.0–46.0)
HEMOGLOBIN: 14.1 g/dL (ref 12.0–15.0)
LYMPHS ABS: 3.3 10*3/uL (ref 0.7–4.0)
LYMPHS PCT: 30 % (ref 12–46)
MCH: 28.2 pg (ref 26.0–34.0)
MCHC: 33.3 g/dL (ref 30.0–36.0)
MCV: 84.8 fL (ref 78.0–100.0)
MONO ABS: 0.7 10*3/uL (ref 0.1–1.0)
MONOS PCT: 7 % (ref 3–12)
NEUTROS ABS: 6.7 10*3/uL (ref 1.7–7.7)
NEUTROS PCT: 60 % (ref 43–77)
Platelets: 308 10*3/uL (ref 150–400)
RBC: 5 MIL/uL (ref 3.87–5.11)
RDW: 13.7 % (ref 11.5–15.5)
WBC: 11.1 10*3/uL — ABNORMAL HIGH (ref 4.0–10.5)

## 2013-10-01 LAB — PREGNANCY, URINE: Preg Test, Ur: NEGATIVE

## 2013-10-01 LAB — LIPASE, BLOOD: Lipase: 38 U/L (ref 11–59)

## 2013-10-01 LAB — HEPATIC FUNCTION PANEL
ALT: 42 U/L — ABNORMAL HIGH (ref 0–35)
AST: 41 U/L — ABNORMAL HIGH (ref 0–37)
Albumin: 2.9 g/dL — ABNORMAL LOW (ref 3.5–5.2)
Alkaline Phosphatase: 59 U/L (ref 39–117)
Bilirubin, Direct: <0.2 mg/dL (ref 0.0–0.3)
Total Bilirubin: <0.2 mg/dL — ABNORMAL LOW (ref 0.3–1.2)
Total Protein: 5.9 g/dL — ABNORMAL LOW (ref 6.0–8.3)

## 2013-10-01 LAB — URINALYSIS, ROUTINE W REFLEX MICROSCOPIC
BILIRUBIN URINE: NEGATIVE
GLUCOSE, UA: NEGATIVE mg/dL
Hgb urine dipstick: NEGATIVE
KETONES UR: NEGATIVE mg/dL
Leukocytes, UA: NEGATIVE
NITRITE: NEGATIVE
PH: 5 (ref 5.0–8.0)
Protein, ur: NEGATIVE mg/dL
SPECIFIC GRAVITY, URINE: 1.02 (ref 1.005–1.030)
Urobilinogen, UA: 0.2 mg/dL (ref 0.0–1.0)

## 2013-10-01 MED ORDER — PROMETHAZINE HCL 12.5 MG PO TABS: 25.0000 mg | ORAL_TABLET | Freq: Four times a day (QID) | ORAL | Status: DC | PRN

## 2013-10-01 MED ORDER — MORPHINE SULFATE 4 MG/ML IJ SOLN
4.0000 mg | Freq: Once | INTRAMUSCULAR | Status: AC
  Administered 2013-10-01: 4 mg via INTRAVENOUS
  Filled 2013-10-01: qty 1

## 2013-10-01 MED ORDER — SODIUM CHLORIDE 0.9 % IV BOLUS (SEPSIS)
1000.0000 mL | INTRAVENOUS | Status: AC
  Administered 2013-10-01: 1000 mL via INTRAVENOUS

## 2013-10-01 MED ORDER — KETOROLAC TROMETHAMINE 30 MG/ML IJ SOLN
30.0000 mg | Freq: Once | INTRAMUSCULAR | Status: AC
  Administered 2013-10-01: 30 mg via INTRAVENOUS
  Filled 2013-10-01: qty 1

## 2013-10-01 MED ORDER — DICYCLOMINE HCL 10 MG PO CAPS
10.0000 mg | ORAL_CAPSULE | Freq: Once | ORAL | Status: AC
  Administered 2013-10-01: 10 mg via ORAL
  Filled 2013-10-01: qty 1

## 2013-10-01 MED ORDER — ASPIRIN-ACETAMINOPHEN-CAFFEINE 250-250-65 MG PO TABS
2.0000 | ORAL_TABLET | Freq: Once | ORAL | Status: AC
  Administered 2013-10-01: 2 via ORAL
  Filled 2013-10-01: qty 2

## 2013-10-01 MED ORDER — ONDANSETRON HCL 4 MG/2ML IJ SOLN
4.0000 mg | Freq: Once | INTRAMUSCULAR | Status: AC
  Administered 2013-10-01: 4 mg via INTRAVENOUS
  Filled 2013-10-01: qty 2

## 2013-10-01 NOTE — ED Provider Notes (Signed)
CSN: [id number]     Arrival date & time 10/01/13  1413 History   First MD Initiated Contact with Patient 10/01/13 1710     Chief Complaint  Patient presents with  . Abdominal Pain  . Back Pain     (Consider location/radiation/quality/duration/timing/severity/associated sxs/prior Treatment) HPI Pt is a 33yo female with hx of HTN, GERD, IBS, and migraine headache c/o abdominal pain, nausea, and vomiting that started worsening since Saturday, 3/7.  Pt states it feels like her abdomen gets more bloated throughout the day. Abdominal pain is diffuse, cramping, 9/10, feels like her regular IBS pain. Reports taking 3 doses of Bentyl yesterday with moderate relief but only took one dose today. States her GI specialist advised her she may take up to 4 doses a day. Reports taking her last phenergan yesterday.  Pt did have recent change from HCTZ to Lisinopril and Lasix, states first two weeks she was doing well, but this weekend, being her 4th week on medication, pt states that is when she noticed worsening of her symptoms. Pt also c/o headache that was gradual in onset, started while pt was in ED waiting room. Pt has hx of migraines and reports feels similar to previous headaches. Pain is aching, and throbbing on left side of head, constant, 9/42mworse with light. Has not taken anything for headache.  Denies change in vision, numbness or tingling.  Denies fevers but does report hot and cold chills. Denies diarrhea but reports "looser stools."  Pt denies vaginal symptoms but states she has noticed some "leaking" and having to wear panty liners due to urine leaking.  Denies bleeding. Pt not concerned for STD.   Past Medical History  Diagnosis Date  . Hypertension   . GERD (gastroesophageal reflux disease)   . IBS (irritable bowel syndrome)   . Migraine headache   . S/P appy 05/2013   Past Surgical History  Procedure Laterality Date  . Dental surgery    . Laparoscopic appendectomy N/A 06/06/2013   Procedure: APPENDECTOMY LAPAROSCOPIC;  Surgeon: MImogene Burn TGeorgette Dover MD;  Location: [facility]  Service: General;  Laterality: N/A;  . Appendectomy     Family History  Problem Relation Age of Onset  . COPD Mother   . Hypertension Mother   . Cancer Father   . Diabetes Father   . Hypertension Father   . Heart disease Father   . Hyperlipidemia Father   . Asthma Sister   . Hyperlipidemia Brother   . Hypertension Brother   . Vision loss Brother    History  Substance Use Topics  . Smoking status: Former Smoker -- 1.00 packs/day for 20 years    Quit date: 02/22/2013  . Smokeless tobacco: Never Used  . Alcohol Use: Yes     Comment: social   OB History   Grav Para Term Preterm Abortions TAB SAB Ect Mult Living                 Review of Systems  Constitutional: Positive for chills. Negative for fever and diaphoresis.  Gastrointestinal: Positive for nausea, vomiting and abdominal pain. Negative for diarrhea, constipation and blood in stool.  Musculoskeletal: Positive for myalgias. Negative for back pain, neck pain and neck stiffness.  Neurological: Positive for headaches. Negative for dizziness and light-headedness.  All other systems reviewed and are negative.      Allergies  Codeine  Home Medications   Current Outpatient Rx  Name  Route  Sig  Dispense  Refill  . cetirizine (ZYRTEC)  10 MG tablet   Oral   Take 10 mg by mouth daily.         Marland Kitchen dicyclomine (BENTYL) 10 MG capsule   Oral   Take 1 capsule (10 mg total) by mouth 4 (four) times daily -  before meals and at bedtime.   120 capsule   11   . furosemide (LASIX) 40 MG tablet   Oral   Take 1 tablet (40 mg total) by mouth every morning.   30 tablet   3   . lisinopril (PRINIVIL,ZESTRIL) 10 MG tablet   Oral   Take 1 tablet (10 mg total) by mouth daily.   90 tablet   3   . naproxen sodium (ALEVE) 220 MG tablet   Oral   Take 440 mg by mouth every evening.         . norgestimate-ethinyl estradiol  (ORTHO-CYCLEN,SPRINTEC,PREVIFEM) 0.25-35 MG-MCG tablet   Oral   Take 1 tablet by mouth every evening.         Marland Kitchen oxyCODONE-acetaminophen (PERCOCET/ROXICET) 5-325 MG per tablet   Oral   Take 1 tablet by mouth every 4 (four) hours as needed for moderate pain.         . potassium chloride (K-DUR,KLOR-CON) 10 MEQ tablet   Oral   Take 1 tablet (10 mEq total) by mouth 2 (two) times daily.   30 tablet   2   . Prenatal Vit-Fe Sulfate-FA (PRENATAL VITAMIN PO)   Oral   Take 1 tablet by mouth every evening.          . Probiotic Product (PROBIOTIC DAILY PO)   Oral   Take 500 mg by mouth daily.          Marland Kitchen omeprazole (PRILOSEC) 20 MG capsule      TAKE 1 CAPSULE BY MOUTH DAILY   30 capsule   0   . promethazine (PHENERGAN) 12.5 MG tablet   Oral   Take 2 tablets (25 mg total) by mouth every 6 (six) hours as needed for nausea.   30 tablet   0   . Sod Picosulfate-Mag Ox-Cit Acd ([place]) 10-3.5-12 MG-GM-GM PACK   Oral   Take 1 kit by mouth as directed.   1 each   0    BP 121/84  Pulse 70  Temp(Src) 98.5 F (36.9 C) (Oral)  Resp 12  SpO2 97% Physical Exam  Nursing note and vitals reviewed. Constitutional: She appears well-developed and well-nourished. No distress.  HENT:  Head: Normocephalic and atraumatic.  Eyes: Conjunctivae are normal. No scleral icterus.  Neck: Normal range of motion.  Cardiovascular: Normal rate, regular rhythm and normal heart sounds.   Pulmonary/Chest: Effort normal and breath sounds normal. No respiratory distress. She has no wheezes. She has no rales. She exhibits no tenderness.  Abdominal: Soft. Bowel sounds are normal. She exhibits distension. She exhibits no mass. There is tenderness. There is no rebound and no guarding.  Genitourinary:  Chaperoned exam. Pelvic exam unable to be performed due to pt's discomfort.  External exam normal: no erythema, lesions, or vaginal discharge.  Musculoskeletal: Normal range of motion.  Neurological: She is  alert.  Skin: Skin is warm and dry. She is not diaphoretic.    ED Course  Procedures (including critical care time) Labs Review Labs Reviewed  CBC WITH DIFFERENTIAL - Abnormal; Notable for the following:    WBC 11.1 (*)    All other components within normal limits  BASIC METABOLIC PANEL - Abnormal; Notable for the following:  GFR calc non Af Amer 82 (*)    All other components within normal limits  HEPATIC FUNCTION PANEL - Abnormal; Notable for the following:    Total Protein 5.9 (*)    Albumin 2.9 (*)    AST 41 (*)    ALT 42 (*)    Total Bilirubin <0.2 (*)    All other components within normal limits  URINALYSIS, ROUTINE W REFLEX MICROSCOPIC  LIPASE, BLOOD  PREGNANCY, URINE  POC URINE PREG, ED   Imaging Review No results found.   EKG Interpretation None      MDM   Final diagnoses:  IBS (irritable bowel syndrome)  Abdominal cramping  Nausea & vomiting  Headache    Pt has been in ED multiple times for abdominal symptoms, reviewed pt's medical records and imaging.  Pt with hx of IBS c/o abdominal pain, nausea, and vomiting, reports similar to previous exacerbations of IBS. Pt also has hx of migraines, c/o headache that started while she was in waiting room. States feels like previous headaches.    CT abdomen, abd plain film and pelvic U/S: performed on 1/27. Unremarkable at that time. Pt has f/u with GI on 2/20. Was advised to discontinue magnesium and may take bentyl up to 4x daily.  Advised to f/u with PCP for weight gain which could be contributing to abdominal pain.  Pt is s/p App, 05/2013.    Pt lying in exam bed, appears uncomfortable. Vitals: initially elevated BP, 141/103, but recheck-140/88. Pt is afebrile.  Abd: morbidly obese, abdomen appears mildly distended.  Diffuse tenderness without guarding or rebound.    Labs:  CBC, BMP, UA, and Lipase-unremarkable.   Tx in ED: bentyl, zofran, and morphine given.  Pt stated abdominal symptoms did improve but still  c/o headache.  Toradol, fluids, and Excedrin migraine given in ED which did provide moderate relief.  Pt able to keep down several ounces of PO fluids.   All labs/imaging/findings discussed with patient. All questions answered and concerns addressed. Will discharge pt home and have pt f/u with her PCP and GI specialist. Return precautions given. Pt verbalized understanding and agreement with tx plan. Vitals: unremarkable. Discharged in stable condition.    Discussed pt with Dr. Alvino Chapel during ED encounter and agrees with plan.     Noland Fordyce, PA-C 10/02/13 0021

## 2013-10-01 NOTE — ED Notes (Signed)
Pt reports nausea vomiting, dizziness since about Saturday. Generalized abdominal tenderness. Reports chronic IBS pain and back pain. States took home percocet for pain control didn't do anything and tried phenergan at home also no improvement. VSS. NAD at present.

## 2013-10-01 NOTE — Discharge Instructions (Signed)
Abdominal Pain, Adult Many things can cause belly (abdominal) pain. Most times, the belly pain is not dangerous. Many cases of belly pain can be watched and treated at home. HOME CARE   Do not take medicines that help you go poop (laxatives) unless told to by your doctor.  Only take medicine as told by your doctor.  Eat or drink as told by your doctor. Your doctor will tell you if you should be on a special diet. GET HELP IF:  You do not know what is causing your belly pain.  You have belly pain while you are sick to your stomach (nauseous) or have runny poop (diarrhea).  You have pain while you pee or poop.  Your belly pain wakes you up at night.  You have belly pain that gets worse or better when you eat.  You have belly pain that gets worse when you eat fatty foods. GET HELP RIGHT AWAY IF:   The pain does not go away within 2 hours.  You have a fever.  You keep throwing up (vomiting).  The pain changes and is only in the right or left part of the belly.  You have bloody or tarry looking poop. MAKE SURE YOU:   Understand these instructions.  Will watch your condition.  Will get help right away if you are not doing well or get worse. Document Released: 12/28/2007 Document Revised: 05/01/2013 Document Reviewed: 03/20/2013 [facility] Patient Information 2014 [place].

## 2013-10-02 NOTE — ED Provider Notes (Signed)
Medical screening examination/treatment/procedure(s) were performed by non-physician practitioner and as supervising physician I was immediately available for consultation/collaboration.   EKG Interpretation None       Jasper Riling. Alvino Chapel, MD 10/02/13 [phone]

## 2013-10-14 ENCOUNTER — Ambulatory Visit: Payer: PRIVATE HEALTH INSURANCE | Admitting: Internal Medicine

## 2013-10-16 ENCOUNTER — Ambulatory Visit: Payer: No Typology Code available for payment source | Attending: Internal Medicine | Admitting: Internal Medicine

## 2013-10-16 VITALS — BP 131/93 | HR 93 | Temp 98.7°F | Resp 16 | Ht 66.0 in | Wt 231.0 lb

## 2013-10-16 DIAGNOSIS — Z87891 Personal history of nicotine dependence: Secondary | ICD-10-CM | POA: Insufficient documentation

## 2013-10-16 DIAGNOSIS — I10 Essential (primary) hypertension: Secondary | ICD-10-CM | POA: Insufficient documentation

## 2013-10-16 DIAGNOSIS — F411 Generalized anxiety disorder: Secondary | ICD-10-CM | POA: Insufficient documentation

## 2013-10-16 DIAGNOSIS — Z79899 Other long term (current) drug therapy: Secondary | ICD-10-CM | POA: Insufficient documentation

## 2013-10-16 DIAGNOSIS — G43909 Migraine, unspecified, not intractable, without status migrainosus: Secondary | ICD-10-CM | POA: Insufficient documentation

## 2013-10-16 DIAGNOSIS — K219 Gastro-esophageal reflux disease without esophagitis: Secondary | ICD-10-CM | POA: Insufficient documentation

## 2013-10-16 HISTORY — DX: Generalized anxiety disorder: F41.1

## 2013-10-16 MED ORDER — BUTALBITAL-APAP-CAFFEINE 50-325-40 MG PO TABS: 1.0000 | ORAL_TABLET | Freq: Four times a day (QID) | ORAL | Status: DC | PRN

## 2013-10-16 MED ORDER — ALPRAZOLAM 0.5 MG PO TABS: 0.5000 mg | ORAL_TABLET | Freq: Two times a day (BID) | ORAL | Status: DC | PRN

## 2013-10-16 NOTE — Progress Notes (Signed)
Patient ID: Stephanie Miles, female   DOB: August 18, 1980, 33 y.o.   MRN: [id number]  CC: headaches, anxiety   HPI: 33 year old female who is presenting to the clinic for migraine headaches with associated auras. Pt tried percocet but no significant improvement noted. Patient also reports having anxiety attacks. He she's not taking anything for anxiety. At this time she has no chest pain or shortness of breath or palpitations.  Allergies  Allergen Reactions  . Codeine Nausea Only   Past Medical History  Diagnosis Date  . Hypertension   . GERD (gastroesophageal reflux disease)   . IBS (irritable bowel syndrome)   . Migraine headache   . S/P appy 05/2013   Current Outpatient Prescriptions on File Prior to Visit  Medication Sig Dispense Refill  . cetirizine (ZYRTEC) 10 MG tablet Take 10 mg by mouth daily.      Marland Kitchen dicyclomine (BENTYL) 10 MG capsule Take 1 capsule (10 mg total) by mouth 4 (four) times daily -  before meals and at bedtime.  120 capsule  11  . furosemide (LASIX) 40 MG tablet Take 1 tablet (40 mg total) by mouth every morning.  30 tablet  3  . lisinopril (PRINIVIL,ZESTRIL) 10 MG tablet Take 1 tablet (10 mg total) by mouth daily.  90 tablet  3  . naproxen sodium (ALEVE) 220 MG tablet Take 440 mg by mouth every evening.      . norgestimate-ethinyl estradiol (ORTHO-CYCLEN,SPRINTEC,PREVIFEM) 0.25-35 MG-MCG tablet Take 1 tablet by mouth every evening.      Marland Kitchen omeprazole (PRILOSEC) 20 MG capsule TAKE 1 CAPSULE BY MOUTH DAILY  30 capsule  0  . potassium chloride (K-DUR,KLOR-CON) 10 MEQ tablet Take 1 tablet (10 mEq total) by mouth 2 (two) times daily.  30 tablet  2  . Prenatal Vit-Fe Sulfate-FA (PRENATAL VITAMIN PO) Take 1 tablet by mouth every evening.       . Probiotic Product (PROBIOTIC DAILY PO) Take 500 mg by mouth daily.       . promethazine (PHENERGAN) 12.5 MG tablet Take 2 tablets (25 mg total) by mouth every 6 (six) hours as needed for nausea.  30 tablet  0  . Sod Picosulfate-Mag  Ox-Cit Acd ([place]) 10-3.5-12 MG-GM-GM PACK Take 1 kit by mouth as directed.  1 each  0  . oxyCODONE-acetaminophen (PERCOCET/ROXICET) 5-325 MG per tablet Take 1 tablet by mouth every 4 (four) hours as needed for moderate pain.       No current facility-administered medications on file prior to visit.   Family History  Problem Relation Age of Onset  . COPD Mother   . Hypertension Mother   . Cancer Father   . Diabetes Father   . Hypertension Father   . Heart disease Father   . Hyperlipidemia Father   . Asthma Sister   . Hyperlipidemia Brother   . Hypertension Brother   . Vision loss Brother    History   Social History  . Marital Status: Single    Spouse Name: N/A    Number of Children: N/A  . Years of Education: N/A   Occupational History  . Not on file.   Social History Main Topics  . Smoking status: Former Smoker -- 1.00 packs/day for 20 years    Quit date: 02/22/2013  . Smokeless tobacco: Never Used  . Alcohol Use: Yes     Comment: social  . Drug Use: No  . Sexual Activity: Not on file   Other Topics Concern  . Not  on file   Social History Narrative  . No narrative on file    Review of Systems  Constitutional: Negative for fever, chills, diaphoresis, activity change, appetite change and fatigue.  HENT: Negative for ear pain, nosebleeds, congestion, facial swelling, rhinorrhea, neck pain, neck stiffness and ear discharge.   Eyes: Negative for pain, discharge, redness, itching and visual disturbance.  Respiratory: Negative for cough, choking, chest tightness, shortness of breath, wheezing and stridor.   Cardiovascular: Negative for chest pain, palpitations and leg swelling.  Gastrointestinal: Negative for abdominal distention.  Genitourinary: Negative for dysuria, urgency, frequency, hematuria, flank pain, decreased urine volume, difficulty urinating and dyspareunia.  Musculoskeletal: positive for back pain, no joint swelling, arthralgias and gait problem.   Neurological: positive for headaches  Hematological: Negative for adenopathy. Does not bruise/bleed easily.  Psychiatric/Behavioral: Negative for hallucinations, behavioral problems, confusion, dysphoric mood, decreased concentration and agitation.    Objective:   Filed Vitals:   10/16/13 0939  BP: 131/93  Pulse: 93  Temp: 98.7 F (37.1 C)  Resp: 16    Physical Exam  Constitutional: Appears well-developed and well-nourished. No distress.  HENT: Normocephalic. External right and left ear normal. Oropharynx is clear and moist.  Eyes: Conjunctivae and EOM are normal. PERRLA, no scleral icterus.  Neck: Normal ROM. Neck supple. No JVD. No tracheal deviation. No thyromegaly.  CVS: RRR, S1/S2 +, no murmurs, no gallops, no carotid bruit.  Pulmonary: Effort and breath sounds normal, no stridor, rhonchi, wheezes, rales.  Abdominal: Soft. BS +,  no distension, tenderness, rebound or guarding.  Musculoskeletal: Normal range of motion. No edema and no tenderness.  Lymphadenopathy: No lymphadenopathy noted, cervical, inguinal. Neuro: Alert. Normal reflexes, muscle tone coordination. No cranial nerve deficit. Skin: Skin is warm and dry. No rash noted. Not diaphoretic. No erythema. No pallor.  Psychiatric: Normal mood and affect. Behavior, judgment, thought content normal.   Lab Results  Component Value Date   WBC 11.1* 10/01/2013   HGB 14.1 10/01/2013   HCT 42.4 10/01/2013   MCV 84.8 10/01/2013   PLT 308 10/01/2013   Lab Results  Component Value Date   CREATININE 0.90 10/01/2013   BUN 15 10/01/2013   NA 141 10/01/2013   K 4.4 10/01/2013   CL 102 10/01/2013   CO2 24 10/01/2013    No results found for this basename: HGBA1C   Lipid Panel  No results found for this basename: chol, trig, hdl, cholhdl, vldl, ldlcalc       Assessment and plan:   Patient Active Problem List   Diagnosis Date Noted  . Anxiety state, unspecified 10/16/2013    Priority: Medium - prescription provided for low  dose xanax - referral to psych provided  . Migraine 07/08/2013    Priority: Medium - prescription for Fioricet provided; pt instructed to let us know if medication works and/or if it's not working. I told her to stop taking it if she sees no improvement.

## 2013-10-16 NOTE — Patient Instructions (Signed)

## 2013-10-16 NOTE — Progress Notes (Signed)
Pt is here having frequent migrains. Pt has acute pain in her neck and back. Pt reports her stomach is bloating at night causing her to have stretch marks.

## 2013-10-23 ENCOUNTER — Ambulatory Visit: Payer: No Typology Code available for payment source | Attending: Internal Medicine | Admitting: *Deleted

## 2013-10-23 DIAGNOSIS — F411 Generalized anxiety disorder: Secondary | ICD-10-CM

## 2013-10-23 DIAGNOSIS — F329 Major depressive disorder, single episode, unspecified: Secondary | ICD-10-CM

## 2013-10-23 DIAGNOSIS — F32A Depression, unspecified: Secondary | ICD-10-CM

## 2013-10-23 DIAGNOSIS — F3289 Other specified depressive episodes: Secondary | ICD-10-CM

## 2013-10-23 NOTE — Progress Notes (Signed)
LCSW met with patient who has several challenges with anxiety in order to identify her area of challenges through assessment for appropriate referral for follow up care and treatment. The LCSW administered the PHQ-SADS. Patient scores are as follows: PHQ-15 - 19; GAD-7 - 15; Positive for Anxiety Attacks; and PHQ-9 - 15. Therefore, patient has clear Generalized Anxiety Disorder and Mild to Moderate Depression.  Somatoform disorder may need further assessment due to patient's other diagnosis that may account for some of her pain.  LCSW referred patient to [facility] of the [place] for Medication Management and possible Counseling. LCSW encouraged patient to follow up on resources for further support.  LCSW also encouraged patient to follow up with psychiatrist about possible assessment for Bipolar disorder and PTSD diagnoses.  LCSW will follow up for further management of mental health challenges.    Christene Lye MSW, LCSW Duration 60 minutes

## 2013-10-30 ENCOUNTER — Other Ambulatory Visit: Payer: Self-pay | Admitting: Internal Medicine

## 2013-10-31 ENCOUNTER — Ambulatory Visit: Payer: No Typology Code available for payment source

## 2013-10-31 ENCOUNTER — Telehealth: Payer: Self-pay

## 2013-10-31 ENCOUNTER — Telehealth: Payer: Self-pay | Admitting: Internal Medicine

## 2013-10-31 ENCOUNTER — Other Ambulatory Visit: Payer: Self-pay | Admitting: Internal Medicine

## 2013-10-31 ENCOUNTER — Other Ambulatory Visit: Payer: Self-pay

## 2013-10-31 MED ORDER — OMEPRAZOLE 20 MG PO CPDR: DELAYED_RELEASE_CAPSULE | ORAL | Status: DC

## 2013-10-31 MED ORDER — ALPRAZOLAM 0.5 MG PO TABS: 0.5000 mg | ORAL_TABLET | Freq: Two times a day (BID) | ORAL | Status: DC | PRN

## 2013-10-31 NOTE — Telephone Encounter (Signed)
Pt called regarding a refill of her medication, please contact pt as soon as possible

## 2013-10-31 NOTE — Telephone Encounter (Signed)
Patient called needing new prescriptions for her xanax and prilosec Prescriptions refilled

## 2013-11-06 ENCOUNTER — Ambulatory Visit: Payer: Self-pay | Admitting: Internal Medicine

## 2013-11-12 ENCOUNTER — Ambulatory Visit (AMBULATORY_SURGERY_CENTER): Payer: Self-pay | Admitting: Gastroenterology

## 2013-11-12 ENCOUNTER — Encounter: Payer: Self-pay | Admitting: Gastroenterology

## 2013-11-12 VITALS — BP 140/87 | HR 66 | Temp 98.5°F | Resp 24 | Ht 66.0 in | Wt 228.0 lb

## 2013-11-12 DIAGNOSIS — D126 Benign neoplasm of colon, unspecified: Secondary | ICD-10-CM

## 2013-11-12 DIAGNOSIS — R197 Diarrhea, unspecified: Secondary | ICD-10-CM

## 2013-11-12 DIAGNOSIS — R1084 Generalized abdominal pain: Secondary | ICD-10-CM

## 2013-11-12 MED ORDER — SODIUM CHLORIDE 0.9 % IV SOLN: 500.0000 mL | INTRAVENOUS | Status: DC

## 2013-11-12 NOTE — Op Note (Signed)
[place]  [address]. [place], [id number]   COLONOSCOPY PROCEDURE REPORT  PATIENT: Stephanie Miles, Stephanie Miles  MR#: [id number] BIRTHDATE: 05/08/1981 , 32  yrs. old GENDER: Female ENDOSCOPIST: Ladene Artist, MD, [facility] REFERRED SU:[id number] Doreene Burke, M.D. PROCEDURE DATE:  11/12/2013 PROCEDURE:   Colonoscopy with biopsy First Screening Colonoscopy - Avg.  risk and is 50 yrs.  old or older - No.  Prior Negative Screening - Now for repeat screening. N/A  History of Adenoma - Now for follow-up colonoscopy & has been > or = to 3 yrs.  N/A  Polyps Removed Today? No.  Recommend repeat exam, <10 yrs? No. ASA CLASS:   Class II INDICATIONS:Unexplained diarrhea and Abdominal pain. MEDICATIONS: MAC sedation, administered by CRNA and propofol (Diprivan) 430m IV DESCRIPTION OF PROCEDURE:   After the risks benefits and alternatives of the procedure were thoroughly explained, informed consent was obtained.  A digital rectal exam revealed no abnormalities of the rectum.   The LB [id number] endoscope was introduced through the anus and advanced to the terminal ileum which was intubated for a short distance. No adverse events experienced.   The quality of the prep was good, using MoviPrep The instrument was then slowly withdrawn as the colon was fully examined.  COLON FINDINGS: A normal appearing cecum, ileocecal valve, and appendiceal orifice were identified.  The ascending, hepatic flexure, transverse, splenic flexure, descending, sigmoid colon and rectum appeared unremarkable.  No polyps or cancers were seen. Multiple random biopsies of the area were performed.   The mucosa appeared normal in the terminal ileum.  Retroflexed views revealed small internal hemorrhoids. The time to cecum=2 minutes 16 seconds. Withdrawal time=7 minutes 36 seconds.  The scope was withdrawn and the procedure completed.  COMPLICATIONS: There were no complications.  ENDOSCOPIC IMPRESSION: 1.    Normal colon; multiple random biopsies performed 2.   Normal mucosa in the terminal ileum 3.   Small internal hemrrhoids  RECOMMENDATIONS: 1.  Await pathology results 2.  Continue treatment for IBS and further follow up with her PCP  eSigned:  MLadene Artist MD, FSoutheast [facility]04/21/2015 3:45 PM

## 2013-11-12 NOTE — Progress Notes (Signed)
Called to room to assist during endoscopic procedure.  Patient ID and intended procedure confirmed with present staff. Received instructions for my participation in the procedure from the performing physician.  

## 2013-11-12 NOTE — Patient Instructions (Signed)
YOU HAD AN ENDOSCOPIC PROCEDURE TODAY AT [facility]: Refer to the procedure report that was given to you for any specific questions about what was found during the examination.  If the procedure report does not answer your questions, please call your gastroenterologist to clarify.  If you requested that your care partner not be given the details of your procedure findings, then the procedure report has been included in a sealed envelope for you to review at your convenience later.  YOU SHOULD EXPECT: Some feelings of bloating in the abdomen. Passage of more gas than usual.  Walking can help get rid of the air that was put into your GI tract during the procedure and reduce the bloating. If you had a lower endoscopy (such as a colonoscopy or flexible sigmoidoscopy) you may notice spotting of blood in your stool or on the toilet paper. If you underwent a bowel prep for your procedure, then you may not have a normal bowel movement for a few days.  DIET: Your first meal following the procedure should be a light meal and then it is ok to progress to your normal diet.  A half-sandwich or bowl of soup is an example of a good first meal.  Heavy or fried foods are harder to digest and may make you feel nauseous or bloated.  Likewise meals heavy in dairy and vegetables can cause extra gas to form and this can also increase the bloating.  Drink plenty of fluids but you should avoid alcoholic beverages for 24 hours.  ACTIVITY: Your care partner should take you home directly after the procedure.  You should plan to take it easy, moving slowly for the rest of the day.  You can resume normal activity the day after the procedure however you should NOT DRIVE or use heavy machinery for 24 hours (because of the sedation medicines used during the test).    SYMPTOMS TO REPORT IMMEDIATELY: A gastroenterologist can be reached at any hour.  During normal business hours, 8:30 AM to 5:00 PM Monday through Friday,  call [phone].  After hours and on weekends, please call the GI answering service at [phone] who will take a message and have the physician on call contact you.   Following lower endoscopy (colonoscopy or flexible sigmoidoscopy):  Excessive amounts of blood in the stool  Significant tenderness or worsening of abdominal pains  Swelling of the abdomen that is new, acute  Fever of 100F or higher    FOLLOW UP: If any biopsies were taken you will be contacted by phone or by letter within the next 1-3 weeks.  Call your gastroenterologist if you have not heard about the biopsies in 3 weeks.  Our staff will call the home number listed on your records the next business day following your procedure to check on you and address any questions or concerns that you may have at that time regarding the information given to you following your procedure. This is a courtesy call and so if there is no answer at the home number and we have not heard from you through the emergency physician on call, we will assume that you have returned to your regular daily activities without incident.  SIGNATURES/CONFIDENTIALITY: You and/or your care partner have signed paperwork which will be entered into your electronic medical record.  These signatures attest to the fact that that the information above on your After Visit Summary has been reviewed and is understood.  Full responsibility of the confidentiality  of this discharge information lies with you and/or your care-partner.   Follow up with Primary Care Physician  Await biopsy results   Information on hemorrhoids given to you today

## 2013-11-13 ENCOUNTER — Telehealth: Payer: Self-pay | Admitting: *Deleted

## 2013-11-13 NOTE — Telephone Encounter (Signed)
  Follow up Call-  Call back number 11/12/2013  Post procedure Call Back phone  # [phone] cell  Permission to leave phone message Yes    [facility]

## 2013-11-20 ENCOUNTER — Encounter: Payer: Self-pay | Admitting: Gastroenterology

## 2013-12-03 ENCOUNTER — Ambulatory Visit: Payer: No Typology Code available for payment source | Attending: Internal Medicine | Admitting: *Deleted

## 2013-12-03 VITALS — BP 142/94 | HR 101 | Temp 99.0°F | Resp 18 | Ht 66.0 in | Wt 231.2 lb

## 2013-12-03 DIAGNOSIS — I10 Essential (primary) hypertension: Secondary | ICD-10-CM

## 2013-12-03 MED ORDER — LISINOPRIL 20 MG PO TABS: 20.0000 mg | ORAL_TABLET | Freq: Every day | ORAL | Status: DC

## 2013-12-03 NOTE — Progress Notes (Signed)
Patient here for blood pressure check. Per patient when she went for colonoscopy was told her blood pressure was not controlled and was to follow back up with [facility]. Patient Lisinopril bumped up to 20 mg.

## 2013-12-03 NOTE — Patient Instructions (Signed)

## 2013-12-10 ENCOUNTER — Ambulatory Visit: Payer: No Typology Code available for payment source | Attending: Internal Medicine

## 2013-12-10 NOTE — Patient Instructions (Signed)
Continue taking prescribed Lisinopril 20 mg daily with bp monitoring and return with next scheduled f/u appt x 1 mnth We will do blood work with next appointment Stay hydrated with taking medications

## 2013-12-17 ENCOUNTER — Other Ambulatory Visit: Payer: Self-pay | Admitting: Internal Medicine

## 2013-12-17 ENCOUNTER — Telehealth: Payer: Self-pay

## 2013-12-17 ENCOUNTER — Telehealth: Payer: Self-pay | Admitting: Internal Medicine

## 2013-12-17 ENCOUNTER — Telehealth: Payer: Self-pay | Admitting: Gastroenterology

## 2013-12-17 MED ORDER — OMEPRAZOLE 20 MG PO CPDR
DELAYED_RELEASE_CAPSULE | ORAL | Status: DC
Start: 2013-12-17 — End: 2014-09-04

## 2013-12-17 NOTE — Telephone Encounter (Signed)
Patient requesting refill on her xanax

## 2013-12-17 NOTE — Telephone Encounter (Signed)
The patient has had a referral to Psychiatry.  She has been getting Xanax here for past three months. She will need to see psychiatry to manage anxiety disorder or begin on a long term medication to manage anxiety. Thanks

## 2013-12-17 NOTE — Telephone Encounter (Signed)
Pt called requesting a refill of her medication ALPRAZolam (XANAX) 0.5 MG tablet, please contact pt

## 2013-12-17 NOTE — Telephone Encounter (Signed)
Prescription sent to patient's pharmacy.

## 2013-12-17 NOTE — Telephone Encounter (Signed)
Change omeprazole to twice daily as requested.

## 2013-12-17 NOTE — Telephone Encounter (Signed)
Patient states that she has breakthrough reflux around 2-3 x week on once daily omeprazole and has been taking her omeprazole twice daily recently but runs out too quickly. Patient wants to know if she can have her prescription for twice a day dosing since this helps keep her reflux under control. Told patient to also do anti-reflux measures along with taking her omeprazole twice a day and I will ask Dr. Fuller Plan if I can change her prescription. Please advise.

## 2013-12-19 ENCOUNTER — Telehealth: Payer: Self-pay

## 2013-12-19 NOTE — Telephone Encounter (Signed)
Spoke with patient in reference to refill on her xanax Explained to her that we can no longer refill her prescription We referred out to physch and she has been seeing someone at [facility]

## 2014-01-07 ENCOUNTER — Emergency Department (HOSPITAL_COMMUNITY)
Admission: EM | Admit: 2014-01-07 | Discharge: 2014-01-07 | Disposition: A | Discharge status: Home / Self Care | Payer: No Typology Code available for payment source | Attending: Emergency Medicine | Admitting: Emergency Medicine

## 2014-01-07 ENCOUNTER — Encounter (HOSPITAL_COMMUNITY): Payer: Self-pay | Admitting: Emergency Medicine

## 2014-01-07 DIAGNOSIS — K589 Irritable bowel syndrome without diarrhea: Secondary | ICD-10-CM | POA: Insufficient documentation

## 2014-01-07 DIAGNOSIS — M62838 Other muscle spasm: Secondary | ICD-10-CM

## 2014-01-07 DIAGNOSIS — R011 Cardiac murmur, unspecified: Secondary | ICD-10-CM | POA: Insufficient documentation

## 2014-01-07 DIAGNOSIS — Z87891 Personal history of nicotine dependence: Secondary | ICD-10-CM | POA: Insufficient documentation

## 2014-01-07 DIAGNOSIS — F3289 Other specified depressive episodes: Secondary | ICD-10-CM | POA: Insufficient documentation

## 2014-01-07 DIAGNOSIS — Z79899 Other long term (current) drug therapy: Secondary | ICD-10-CM | POA: Insufficient documentation

## 2014-01-07 DIAGNOSIS — Z87442 Personal history of urinary calculi: Secondary | ICD-10-CM | POA: Insufficient documentation

## 2014-01-07 DIAGNOSIS — G43909 Migraine, unspecified, not intractable, without status migrainosus: Secondary | ICD-10-CM | POA: Insufficient documentation

## 2014-01-07 DIAGNOSIS — IMO0002 Reserved for concepts with insufficient information to code with codable children: Secondary | ICD-10-CM | POA: Insufficient documentation

## 2014-01-07 DIAGNOSIS — M62830 Muscle spasm of back: Secondary | ICD-10-CM

## 2014-01-07 DIAGNOSIS — K219 Gastro-esophageal reflux disease without esophagitis: Secondary | ICD-10-CM | POA: Insufficient documentation

## 2014-01-07 DIAGNOSIS — I129 Hypertensive chronic kidney disease with stage 1 through stage 4 chronic kidney disease, or unspecified chronic kidney disease: Secondary | ICD-10-CM | POA: Insufficient documentation

## 2014-01-07 DIAGNOSIS — N189 Chronic kidney disease, unspecified: Secondary | ICD-10-CM | POA: Insufficient documentation

## 2014-01-07 DIAGNOSIS — M5380 Other specified dorsopathies, site unspecified: Secondary | ICD-10-CM | POA: Insufficient documentation

## 2014-01-07 DIAGNOSIS — M549 Dorsalgia, unspecified: Secondary | ICD-10-CM

## 2014-01-07 DIAGNOSIS — F329 Major depressive disorder, single episode, unspecified: Secondary | ICD-10-CM | POA: Insufficient documentation

## 2014-01-07 DIAGNOSIS — F411 Generalized anxiety disorder: Secondary | ICD-10-CM | POA: Insufficient documentation

## 2014-01-07 MED ORDER — ONDANSETRON HCL 4 MG/2ML IJ SOLN
4.0000 mg | Freq: Once | INTRAMUSCULAR | Status: AC
  Administered 2014-01-07: 4 mg via INTRAVENOUS
  Filled 2014-01-07: qty 2

## 2014-01-07 MED ORDER — DEXTROSE 5 % IV SOLN
1000.0000 mg | Freq: Once | INTRAVENOUS | Status: AC
  Administered 2014-01-07: 1000 mg via INTRAVENOUS
  Filled 2014-01-07: qty 10

## 2014-01-07 MED ORDER — OXYCODONE-ACETAMINOPHEN 5-325 MG PO TABS: 2.0000 | ORAL_TABLET | ORAL | Status: DC | PRN

## 2014-01-07 MED ORDER — KETOROLAC TROMETHAMINE 30 MG/ML IJ SOLN
30.0000 mg | Freq: Once | INTRAMUSCULAR | Status: AC
  Administered 2014-01-07: 30 mg via INTRAVENOUS
  Filled 2014-01-07: qty 1

## 2014-01-07 MED ORDER — DIAZEPAM 5 MG PO TABS: 5.0000 mg | ORAL_TABLET | Freq: Two times a day (BID) | ORAL | Status: DC

## 2014-01-07 MED ORDER — METHOCARBAMOL 500 MG PO TABS: 500.0000 mg | ORAL_TABLET | Freq: Two times a day (BID) | ORAL | Status: DC

## 2014-01-07 MED ORDER — MORPHINE SULFATE 4 MG/ML IJ SOLN: 4.0000 mg | INTRAMUSCULAR | Status: DC | PRN

## 2014-01-07 MED ORDER — DIAZEPAM 5 MG/ML IJ SOLN
5.0000 mg | Freq: Once | INTRAMUSCULAR | Status: AC
  Administered 2014-01-07: 20:00:00 via INTRAVENOUS
  Filled 2014-01-07: qty 2

## 2014-01-07 NOTE — ED Notes (Signed)
Pt reports severe low back pain, states she was sitting on her bed folding clothes today when she reached for a shirt, she felt a sharp stabbing pain in her lower back.  Pt also reports tingling sensation in bila feet.  Denies any hx of back pain.  Pt received 279m of fentanyl en route.  j

## 2014-01-07 NOTE — Discharge Instructions (Signed)
Back Pain, Adult Low back pain is very common. About 1 in 5 people have back pain.The cause of low back pain is rarely dangerous. The pain often gets better over time.About half of people with a sudden onset of back pain feel better in just 2 weeks. About 8 in 10 people feel better by 6 weeks.  CAUSES Some common causes of back pain include:  Strain of the muscles or ligaments supporting the spine.  Wear and tear (degeneration) of the spinal discs.  Arthritis.  Direct injury to the back. DIAGNOSIS Most of the time, the direct cause of low back pain is not known.However, back pain can be treated effectively even when the exact cause of the pain is unknown.Answering your caregiver's questions about your overall health and symptoms is one of the most accurate ways to make sure the cause of your pain is not dangerous. If your caregiver needs more information, he or she may order lab work or imaging tests (X-rays or MRIs).However, even if imaging tests show changes in your back, this usually does not require surgery. HOME CARE INSTRUCTIONS For many people, back pain returns.Since low back pain is rarely dangerous, it is often a condition that people can learn to manageon their own.   Remain active. It is stressful on the back to sit or stand in one place. Do not sit, drive, or stand in one place for more than 30 minutes at a time. Take short walks on level surfaces as soon as pain allows.Try to increase the length of time you walk each day.  Do not stay in bed.Resting more than 1 or 2 days can delay your recovery.  Do not avoid exercise or work.Your body is made to move.It is not dangerous to be active, even though your back may hurt.Your back will likely heal faster if you return to being active before your pain is gone.  Pay attention to your body when you bend and lift. Many people have less discomfortwhen lifting if they bend their knees, keep the load close to their bodies,and  avoid twisting. Often, the most comfortable positions are those that put less stress on your recovering back.  Find a comfortable position to sleep. Use a firm mattress and lie on your side with your knees slightly bent. If you lie on your back, put a pillow under your knees.  Only take over-the-counter or prescription medicines as directed by your caregiver. Over-the-counter medicines to reduce pain and inflammation are often the most helpful.Your caregiver may prescribe muscle relaxant drugs.These medicines help dull your pain so you can more quickly return to your normal activities and healthy exercise.  Put ice on the injured area.  Put ice in a plastic bag.  Place a towel between your skin and the bag.  Leave the ice on for 15-20 minutes, 03-04 times a day for the first 2 to 3 days. After that, ice and heat may be alternated to reduce pain and spasms.  Ask your caregiver about trying back exercises and gentle massage. This may be of some benefit.  Avoid feeling anxious or stressed.Stress increases muscle tension and can worsen back pain.It is important to recognize when you are anxious or stressed and learn ways to manage it.Exercise is a great option. SEEK MEDICAL CARE IF:  You have pain that is not relieved with rest or medicine.  You have pain that does not improve in 1 week.  You have new symptoms.  You are generally not feeling well. SEEK   IMMEDIATE MEDICAL CARE IF:   You have pain that radiates from your back into your legs.  You develop new bowel or bladder control problems.  You have unusual weakness or numbness in your arms or legs.  You develop nausea or vomiting.  You develop abdominal pain.  You feel faint. Document Released: 07/11/2005 Document Revised: 01/10/2012 Document Reviewed: 11/29/2010 Exit[employer] Patient Information 2014 [employer].  

## 2014-01-07 NOTE — ED Notes (Signed)
Family at bedside. 

## 2014-01-07 NOTE — ED Notes (Signed)
Sandwich and drink given to pt, okayed by Jeneen Rinks EDP

## 2014-01-07 NOTE — ED Provider Notes (Signed)
CSN: [id number]     Arrival date & time 01/07/14  1820 History   First MD Initiated Contact with Patient 01/07/14 1842     Chief Complaint  Patient presents with  . Back Pain      HPI  Pt presents with lumbar pain .  She was sitting "[place] Style" on her bed folding clothes.  She states she leaned forward from her position to another the piece of clothing upon sudden pain in her back since the burning and spasm. She over and became present from colic left side, but on the right side. No pain down her leg. The bladder bladder incontinence. Has not attempted to ambulate due to her pain. Called  911. Is medicated with fentanyl en route. No history prior back injuries, pain, or symptoms.  Past Medical History  Diagnosis Date  . Hypertension   . GERD (gastroesophageal reflux disease)   . IBS (irritable bowel syndrome)   . Migraine headache   . S/P appy 05/2013  . Anxiety   . Allergy   . Depression   . Heart murmur   . Chronic kidney disease     kidney stones   Past Surgical History  Procedure Laterality Date  . Dental surgery    . Laparoscopic appendectomy N/A 06/06/2013    Procedure: APPENDECTOMY LAPAROSCOPIC;  Surgeon: Imogene Burn. Georgette Dover, MD;  Location: [facility];  Service: General;  Laterality: N/A;  . Appendectomy     Family History  Problem Relation Age of Onset  . COPD Mother   . Hypertension Mother   . Cancer Father   . Diabetes Father   . Hypertension Father   . Heart disease Father   . Hyperlipidemia Father   . Asthma Sister   . Hyperlipidemia Brother   . Hypertension Brother   . Vision loss Brother   . Colon cancer Maternal Grandmother   . Esophageal cancer Neg Hx   . Pancreatic cancer Neg Hx   . Rectal cancer Neg Hx   . Stomach cancer Neg Hx    History  Substance Use Topics  . Smoking status: Former Smoker -- 1.00 packs/day for 20 years    Quit date: 02/22/2013  . Smokeless tobacco: Never Used  . Alcohol Use: Yes     Comment: social   OB History   Grav  Para Term Preterm Abortions TAB SAB Ect Mult Living                 Review of Systems  Constitutional: Negative for fever, chills, diaphoresis, appetite change and fatigue.  HENT: Negative for mouth sores, sore throat and trouble swallowing.   Eyes: Negative for visual disturbance.  Respiratory: Negative for cough, chest tightness, shortness of breath and wheezing.   Cardiovascular: Negative for chest pain.  Gastrointestinal: Negative for nausea, vomiting, abdominal pain, diarrhea and abdominal distention.  Endocrine: Negative for polydipsia, polyphagia and polyuria.  Genitourinary: Negative for dysuria, frequency and hematuria.  Musculoskeletal: Positive for back pain and myalgias. Negative for gait problem.  Skin: Negative for color change, pallor and rash.  Neurological: Negative for dizziness, syncope, light-headedness and headaches.  Hematological: Does not bruise/bleed easily.  Psychiatric/Behavioral: Negative for behavioral problems and confusion.      Allergies  Codeine  Home Medications   Prior to Admission medications   Medication Sig Start Date End Date Taking? Authorizing Provider  ALPRAZolam Duanne Moron) 0.5 MG tablet Take 1 tablet (0.5 mg total) by mouth 2 (two) times daily as needed for anxiety. 10/31/13  Yes Angelica Chessman, MD  butalbital-acetaminophen-caffeine (FIORICET) [phone] MG per tablet Take 1-2 tablets by mouth every 6 (six) hours as needed for headache. 10/16/13 10/16/14 Yes Robbie Lis, MD  cetirizine (ZYRTEC) 10 MG tablet Take 10 mg by mouth daily.   Yes Historical Provider, MD  dicyclomine (BENTYL) 10 MG capsule Take 1 capsule (10 mg total) by mouth 4 (four) times daily -  before meals and at bedtime. 09/13/13  Yes Ladene Artist, MD  fluticasone (FLONASE) 50 MCG/ACT nasal spray Place 2 sprays into both nostrils daily.   Yes Historical Provider, MD  furosemide (LASIX) 40 MG tablet Take 1 tablet (40 mg total) by mouth every morning. 09/18/13  Yes Reyne Dumas, MD  lisinopril (PRINIVIL,ZESTRIL) 20 MG tablet Take 1 tablet (20 mg total) by mouth daily. 12/03/13  Yes Angelica Chessman, MD  naproxen sodium (ALEVE) 220 MG tablet Take 440 mg by mouth every evening.   Yes Historical Provider, MD  norgestimate-ethinyl estradiol (ORTHO-CYCLEN,SPRINTEC,PREVIFEM) 0.25-35 MG-MCG tablet Take 1 tablet by mouth every evening.   Yes Historical Provider, MD  omeprazole (PRILOSEC) 20 MG capsule TAKE 1 CAPSULE BY MOUTH TWICE DAILY 12/17/13  Yes Ladene Artist, MD  oxyCODONE-acetaminophen (PERCOCET/ROXICET) 5-325 MG per tablet Take 1 tablet by mouth every 4 (four) hours as needed for moderate pain. 08/21/13  Yes Larene Pickett, PA-C  potassium chloride (K-DUR,KLOR-CON) 10 MEQ tablet Take 1 tablet (10 mEq total) by mouth 2 (two) times daily. 09/18/13  Yes Reyne Dumas, MD  Prenatal Vit-Fe Sulfate-FA (PRENATAL VITAMIN PO) Take 1 tablet by mouth every evening.    Yes Historical Provider, MD  Probiotic Product (PROBIOTIC DAILY PO) Take 500 mg by mouth daily.    Yes Historical Provider, MD  promethazine (PHENERGAN) 12.5 MG tablet Take 2 tablets (25 mg total) by mouth every 6 (six) hours as needed for nausea. 10/01/13  Yes Noland Fordyce, PA-C  traZODone (DESYREL) 50 MG tablet Take 50 mg by mouth at bedtime.   Yes Historical Provider, MD  venlafaxine (EFFEXOR) 50 MG tablet Take 50 mg by mouth 2 (two) times daily with a meal.    Yes Historical Provider, MD  diazepam (VALIUM) 5 MG tablet Take 1 tablet (5 mg total) by mouth 2 (two) times daily. 01/07/14   Tanna Furry, MD  methocarbamol (ROBAXIN) 500 MG tablet Take 1 tablet (500 mg total) by mouth 2 (two) times daily. 01/07/14   Tanna Furry, MD  oxyCODONE-acetaminophen (PERCOCET/ROXICET) 5-325 MG per tablet Take 2 tablets by mouth every 4 (four) hours as needed. 01/07/14   Tanna Furry, MD   BP 130/82  Pulse 85  Temp(Src) 98.4 F (36.9 C) (Oral)  Resp 20  SpO2 99% Physical Exam  Constitutional: She is oriented to person, place, and  time. She appears well-developed and well-nourished. No distress.  HENT:  Head: Normocephalic.  Eyes: Conjunctivae are normal. Pupils are equal, round, and reactive to light. No scleral icterus.  Neck: Normal range of motion. Neck supple. No thyromegaly present.  Cardiovascular: Normal rate and regular rhythm.  Exam reveals no gallop and no friction rub.   No murmur heard. Pulmonary/Chest: Effort normal and breath sounds normal. No respiratory distress. She has no wheezes. She has no rales.  Abdominal: Soft. Bowel sounds are normal. She exhibits no distension. There is no tenderness. There is no rebound.  Musculoskeletal: Normal range of motion.       Back:  Neurological: She is alert and oriented to person, place, and time.  Does not  indicate any pain or symptoms in a radicular pattern.   Normal symmetric sensation to all distributions to LEs Patellar and achilles reflexes 1-2+. Downgoing Babinski   Skin: Skin is warm and dry. No rash noted.  Psychiatric: She has a normal mood and affect. Her behavior is normal.    ED Course  Procedures (including critical care time) Labs Review Labs Reviewed - No data to display  Imaging Review No results found.   EKG Interpretation None      MDM   Final diagnoses:  Lumbar paraspinal muscle spasm  Back pain  Muscle spasm    After medications patient is able sit upright. Feeling improved. No symptoms or findings that suggest this is a herniated disc or acute neurological compromise. Educated her discharged home with instructions to for the next 48 hours supine. Only to eat or to the restroom. Otherwise using ice for the first 24 hours followed by ice heat and massage as needed. Prescription for Percocet, Robaxin, naproxen, Valium.    Tanna Furry, MD 01/07/14 2030

## 2014-01-07 NOTE — ED Notes (Signed)
Bed: WA06 Expected date:  Expected time:  Means of arrival:  Comments: ems-back pain

## 2014-01-07 NOTE — ED Notes (Signed)
Warm packs given to pt as requested

## 2014-01-16 ENCOUNTER — Ambulatory Visit: Payer: No Typology Code available for payment source | Admitting: Internal Medicine

## 2014-01-20 ENCOUNTER — Encounter: Payer: Self-pay | Admitting: Internal Medicine

## 2014-01-20 ENCOUNTER — Ambulatory Visit: Payer: No Typology Code available for payment source | Attending: Internal Medicine | Admitting: Internal Medicine

## 2014-01-20 VITALS — BP 140/88 | HR 100 | Temp 98.8°F | Resp 17 | Wt 237.4 lb

## 2014-01-20 DIAGNOSIS — I129 Hypertensive chronic kidney disease with stage 1 through stage 4 chronic kidney disease, or unspecified chronic kidney disease: Secondary | ICD-10-CM | POA: Insufficient documentation

## 2014-01-20 DIAGNOSIS — F411 Generalized anxiety disorder: Secondary | ICD-10-CM | POA: Insufficient documentation

## 2014-01-20 DIAGNOSIS — Z87891 Personal history of nicotine dependence: Secondary | ICD-10-CM

## 2014-01-20 DIAGNOSIS — N189 Chronic kidney disease, unspecified: Secondary | ICD-10-CM | POA: Insufficient documentation

## 2014-01-20 DIAGNOSIS — R059 Cough, unspecified: Secondary | ICD-10-CM | POA: Insufficient documentation

## 2014-01-20 DIAGNOSIS — I10 Essential (primary) hypertension: Secondary | ICD-10-CM

## 2014-01-20 DIAGNOSIS — K219 Gastro-esophageal reflux disease without esophagitis: Secondary | ICD-10-CM | POA: Insufficient documentation

## 2014-01-20 DIAGNOSIS — R05 Cough: Secondary | ICD-10-CM | POA: Insufficient documentation

## 2014-01-20 DIAGNOSIS — Z760 Encounter for issue of repeat prescription: Secondary | ICD-10-CM | POA: Insufficient documentation

## 2014-01-20 HISTORY — DX: Personal history of nicotine dependence: Z87.891

## 2014-01-20 LAB — BASIC METABOLIC PANEL WITH GFR
BUN: 11 mg/dL (ref 6–23)
CHLORIDE: 105 meq/L (ref 96–112)
CO2: 25 meq/L (ref 19–32)
Calcium: 8.8 mg/dL (ref 8.4–10.5)
Creat: 0.79 mg/dL (ref 0.50–1.10)
GFR, Est African American: >89 mL/min
GFR, Est Non African American: >89 mL/min
Glucose, Bld: 132 mg/dL — ABNORMAL HIGH (ref 70–99)
Potassium: 4.2 mEq/L (ref 3.5–5.3)
SODIUM: 137 meq/L (ref 135–145)

## 2014-01-20 MED ORDER — AMLODIPINE BESYLATE 10 MG PO TABS: 10.0000 mg | ORAL_TABLET | Freq: Every day | ORAL | Status: DC

## 2014-01-20 MED ORDER — FUROSEMIDE 40 MG PO TABS: 40.0000 mg | ORAL_TABLET | Freq: Every morning | ORAL | Status: DC

## 2014-01-20 MED ORDER — POTASSIUM CHLORIDE CRYS ER 10 MEQ PO TBCR: 10.0000 meq | EXTENDED_RELEASE_TABLET | Freq: Two times a day (BID) | ORAL | Status: DC

## 2014-01-20 NOTE — Progress Notes (Signed)
Patient here for follow up on her blood pressure States her blood pressure has been running high  For the past month Has been out of her medications since last thursday

## 2014-01-20 NOTE — Progress Notes (Signed)
Patient ID: Stephanie Miles, female   DOB: February 28, 1981, 33 y.o.   MRN: [id number]  CC: Med refill,   HPI:  Patient presents to clinic today for followup visit on hypertension and anxiety.  Patient reports that she ran out of Lasix and potassium last week and has only been taking lisinopril since.  Patient reports that her blood pressure is usually 130/90 when checked manually her mom at home.  Patient admits to a dry nonproductive cough states the last increase of lisinopril.  Patient reports that she began an exercise regimen that is 30 minutes per day, 4 days a week.  Patient reports that she has quit smoking since last August.  Patient also complains of anxiety that is currently being managed by [facility].   Allergies  Allergen Reactions  . Codeine Nausea Only   Past Medical History  Diagnosis Date  . Hypertension   . GERD (gastroesophageal reflux disease)   . IBS (irritable bowel syndrome)   . Migraine headache   . S/P appy 05/2013  . Anxiety   . Allergy   . Depression   . Heart murmur   . Chronic kidney disease     kidney stones   Current Outpatient Prescriptions on File Prior to Visit  Medication Sig Dispense Refill  . ALPRAZolam (XANAX) 0.5 MG tablet Take 1 tablet (0.5 mg total) by mouth 2 (two) times daily as needed for anxiety.  30 tablet  0  . butalbital-acetaminophen-caffeine (FIORICET) 50-325-40 MG per tablet Take 1-2 tablets by mouth every 6 (six) hours as needed for headache.  20 tablet  0  . cetirizine (ZYRTEC) 10 MG tablet Take 10 mg by mouth daily.      . diazepam (VALIUM) 5 MG tablet Take 1 tablet (5 mg total) by mouth 2 (two) times daily.  20 tablet  0  . dicyclomine (BENTYL) 10 MG capsule Take 1 capsule (10 mg total) by mouth 4 (four) times daily -  before meals and at bedtime.  120 capsule  11  . fluticasone (FLONASE) 50 MCG/ACT nasal spray Place 2 sprays into both nostrils daily.      . furosemide (LASIX) 40 MG tablet Take 1 tablet (40 mg total) by mouth every  morning.  30 tablet  3  . lisinopril (PRINIVIL,ZESTRIL) 20 MG tablet Take 1 tablet (20 mg total) by mouth daily.  60 tablet  0  . methocarbamol (ROBAXIN) 500 MG tablet Take 1 tablet (500 mg total) by mouth 2 (two) times daily.  20 tablet  0  . naproxen sodium (ALEVE) 220 MG tablet Take 440 mg by mouth every evening.      . norgestimate-ethinyl estradiol (ORTHO-CYCLEN,SPRINTEC,PREVIFEM) 0.25-35 MG-MCG tablet Take 1 tablet by mouth every evening.      Marland Kitchen omeprazole (PRILOSEC) 20 MG capsule TAKE 1 CAPSULE BY MOUTH TWICE DAILY  60 capsule  11  . oxyCODONE-acetaminophen (PERCOCET/ROXICET) 5-325 MG per tablet Take 1 tablet by mouth every 4 (four) hours as needed for moderate pain.      Marland Kitchen oxyCODONE-acetaminophen (PERCOCET/ROXICET) 5-325 MG per tablet Take 2 tablets by mouth every 4 (four) hours as needed.  30 tablet  0  . potassium chloride (K-DUR,KLOR-CON) 10 MEQ tablet Take 1 tablet (10 mEq total) by mouth 2 (two) times daily.  30 tablet  2  . Prenatal Vit-Fe Sulfate-FA (PRENATAL VITAMIN PO) Take 1 tablet by mouth every evening.       . Probiotic Product (PROBIOTIC DAILY PO) Take 500 mg by mouth daily.       Marland Kitchen  promethazine (PHENERGAN) 12.5 MG tablet Take 2 tablets (25 mg total) by mouth every 6 (six) hours as needed for nausea.  30 tablet  0  . traZODone (DESYREL) 50 MG tablet Take 50 mg by mouth at bedtime.      Marland Kitchen venlafaxine (EFFEXOR) 50 MG tablet Take 50 mg by mouth 2 (two) times daily with a meal.        No current facility-administered medications on file prior to visit.   Family History  Problem Relation Age of Onset  . COPD Mother   . Hypertension Mother   . Cancer Father   . Diabetes Father   . Hypertension Father   . Heart disease Father   . Hyperlipidemia Father   . Asthma Sister   . Hyperlipidemia Brother   . Hypertension Brother   . Vision loss Brother   . Colon cancer Maternal Grandmother   . Esophageal cancer Neg Hx   . Pancreatic cancer Neg Hx   . Rectal cancer Neg Hx   .  Stomach cancer Neg Hx    History   Social History  . Marital Status: Single    Spouse Name: N/A    Number of Children: N/A  . Years of Education: N/A   Occupational History  . Not on file.   Social History Main Topics  . Smoking status: Former Smoker -- 1.00 packs/day for 20 years    Quit date: 02/22/2013  . Smokeless tobacco: Never Used  . Alcohol Use: Yes     Comment: social  . Drug Use: No  . Sexual Activity: Not on file   Other Topics Concern  . Not on file   Social History Narrative  . No narrative on file   Review of Systems  Constitutional: Negative.   Eyes: Negative.   Respiratory: Positive for cough. Negative for hemoptysis, sputum production, shortness of breath and wheezing.   Cardiovascular: Positive for chest pain (sharp pain, likely from anxiety), palpitations and leg swelling. Negative for claudication.  Gastrointestinal: Positive for abdominal pain.  Genitourinary: Negative.   Skin: Negative.   Neurological: Positive for tingling and headaches.  Psychiatric/Behavioral: The patient is nervous/anxious.       Objective:   Filed Vitals:   01/20/14 0916  BP: 141/102  Pulse: 97  Temp: 98.8 F (37.1 C)  Resp: 17    Physical Exam: Constitutional: Patient appears well-developed and well-nourished. No distress. Eyes: Conjunctivae and EOM are normal. PERRLA, no scleral icterus. Neck: Normal ROM. Neck supple. No JVD. No tracheal deviation. No thyromegaly. CVS: RRR, S1/S2 +, no murmurs, no gallops, no carotid bruit.  Pulmonary: Effort and breath sounds normal, no stridor, rhonchi, wheezes, rales.  Abdominal: Soft. BS +,  no distension, tenderness, rebound or guarding.  Musculoskeletal: Normal range of motion. No edema and no tenderness.  Lymphadenopathy: No lymphadenopathy noted, cervical Neuro: Alert. Normal reflexes Skin: Skin is warm and dry. No rash noted. Not diaphoretic. No erythema. No pallor. Psychiatric: Patient appears anxious. Behavior,  judgment, thought content normal.  Lab Results  Component Value Date   WBC 11.1* 10/01/2013   HGB 14.1 10/01/2013   HCT 42.4 10/01/2013   MCV 84.8 10/01/2013   PLT 308 10/01/2013   Lab Results  Component Value Date   CREATININE 0.90 10/01/2013   BUN 15 10/01/2013   NA 141 10/01/2013   K 4.4 10/01/2013   CL 102 10/01/2013   CO2 24 10/01/2013    No results found for this basename: HGBA1C   Lipid Panel  No results found for this basename: chol, trig, hdl, cholhdl, vldl, ldlcalc       Assessment and plan:   Kalinda was seen today for follow-up and medication refill.  Diagnoses and associated orders for this visit:  Essential hypertension  continue current treatment, willl reassess blood pressure in one week at nurse visit - BASIC METABOLIC PANEL WITH GFR - potassium chloride (K-DUR,KLOR-CON) 10 MEQ tablet; Take 1 tablet (10 mEq total) by mouth 2 (two) times daily. - furosemide (LASIX) 40 MG tablet; Take 1 tablet (40 mg total) by mouth every morning. - amLODipine (NORVASC) 10 MG tablet; Take 1 tablet (10 mg total) by mouth daily.   Personal history of tobacco use, presenting hazards to health  Return in about 1 week (around 01/27/2014) for Nurse Visit, 3 mo PCP, Hypertension.    Chari Manning, NP-C [facility] [phone] 01/22/2014, 9:26 AM

## 2014-01-20 NOTE — Patient Instructions (Signed)
DASH Eating Plan DASH stands for "Dietary Approaches to Stop Hypertension." The DASH eating plan is a healthy eating plan that has been shown to reduce high blood pressure (hypertension). Additional health benefits may include reducing the risk of type 2 diabetes mellitus, heart disease, and stroke. The DASH eating plan may also help with weight loss. WHAT DO I NEED TO KNOW ABOUT THE DASH EATING PLAN? For the DASH eating plan, you will follow these general guidelines:  Choose foods with a percent daily value for sodium of less than 5% (as listed on the food label).  Use salt-free seasonings or herbs instead of table salt or sea salt.  Check with your health care provider or pharmacist before using salt substitutes.  Eat lower-sodium products, often labeled as "lower sodium" or "no salt added."  Eat fresh foods.  Eat more vegetables, fruits, and low-fat dairy products.  Choose whole grains. Look for the word "whole" as the first word in the ingredient list.  Choose fish and skinless chicken or [place] more often than red meat. Limit fish, poultry, and meat to 6 oz (170 g) each day.  Limit sweets, desserts, sugars, and sugary drinks.  Choose heart-healthy fats.  Limit cheese to 1 oz (28 g) per day.  Eat more home-cooked food and less restaurant, buffet, and fast food.  Limit fried foods.  Cook foods using methods other than frying.  Limit canned vegetables. If you do use them, rinse them well to decrease the sodium.  When eating at a restaurant, ask that your food be prepared with less salt, or no salt if possible. WHAT FOODS CAN I EAT? Seek help from a dietitian for individual calorie needs. Grains Whole grain or whole wheat bread. Brown rice. Whole grain or whole wheat pasta. Quinoa, bulgur, and whole grain cereals. Low-sodium cereals. Corn or whole wheat flour tortillas. Whole grain cornbread. Whole grain crackers. Low-sodium crackers. Vegetables Fresh or frozen vegetables  (raw, steamed, roasted, or grilled). Low-sodium or reduced-sodium tomato and vegetable juices. Low-sodium or reduced-sodium tomato sauce and paste. Low-sodium or reduced-sodium canned vegetables.  Fruits All fresh, canned (in natural juice), or frozen fruits. Meat and Other Protein Products Ground beef (85% or leaner), grass-fed beef, or beef trimmed of fat. Skinless chicken or [place]. Ground chicken or [place]. Pork trimmed of fat. All fish and seafood. Eggs. Dried beans, peas, or lentils. Unsalted nuts and seeds. Unsalted canned beans. Dairy Low-fat dairy products, such as skim or 1% milk, 2% or reduced-fat cheeses, low-fat ricotta or cottage cheese, or plain low-fat yogurt. Low-sodium or reduced-sodium cheeses. Fats and Oils Tub margarines without trans fats. Light or reduced-fat mayonnaise and salad dressings (reduced sodium). Avocado. Safflower, olive, or canola oils. Natural peanut or almond butter. Other Unsalted popcorn and pretzels. The items listed above may not be a complete list of recommended foods or beverages. Contact your dietitian for more options. WHAT FOODS ARE NOT RECOMMENDED? Grains White bread. White pasta. White rice. Refined cornbread. Bagels and croissants. Crackers that contain trans fat. Vegetables Creamed or fried vegetables. Vegetables in a cheese sauce. Regular canned vegetables. Regular canned tomato sauce and paste. Regular tomato and vegetable juices. Fruits Dried fruits. Canned fruit in light or heavy syrup. Fruit juice. Meat and Other Protein Products Fatty cuts of meat. Ribs, chicken wings, bacon, sausage, bologna, salami, chitterlings, fatback, hot dogs, bratwurst, and packaged luncheon meats. Salted nuts and seeds. Canned beans with salt. Dairy Whole or 2% milk, cream, half-and-half, and cream cheese. Whole-fat or sweetened yogurt. Full-fat  cheeses or blue cheese. Nondairy creamers and whipped toppings. Processed cheese, cheese spreads, or cheese  curds. Condiments Onion and garlic salt, seasoned salt, table salt, and sea salt. Canned and packaged gravies. Worcestershire sauce. Tartar sauce. Barbecue sauce. Teriyaki sauce. Soy sauce, including reduced sodium. Steak sauce. Fish sauce. Oyster sauce. Cocktail sauce. Horseradish. Ketchup and mustard. Meat flavorings and tenderizers. Bouillon cubes. Hot sauce. Tabasco sauce. Marinades. Taco seasonings. Relishes. Fats and Oils Butter, stick margarine, lard, shortening, ghee, and bacon fat. Coconut, palm kernel, or palm oils. Regular salad dressings. Other Pickles and olives. Salted popcorn and pretzels. The items listed above may not be a complete list of foods and beverages to avoid. Contact your dietitian for more information. WHERE CAN I FIND MORE INFORMATION? National Heart, Lung, and Blood Institute: travelstabloid.com Document Released: 06/30/2011 Document Revised: 07/16/2013 Document Reviewed: 05/15/2013 [facility] Patient Information 2015 [place], [place]. This information is not intended to replace advice given to you by your health care provider. Make sure you discuss any questions you have with your health care provider. Exercise to Lose Weight Exercise and a healthy diet may help you lose weight. Your doctor may suggest specific exercises. EXERCISE IDEAS AND TIPS  Choose low-cost things you enjoy doing, such as walking, bicycling, or exercising to workout videos.  Take stairs instead of the elevator.  Walk during your lunch break.  Park your car further away from work or school.  Go to a gym or an exercise class.  Start with 5 to 10 minutes of exercise each day. Build up to 30 minutes of exercise 4 to 6 days a week.  Wear shoes with good support and comfortable clothes.  Stretch before and after working out.  Work out until you breathe harder and your heart beats faster.  Drink extra water when you exercise.  Do not do so much that you  hurt yourself, feel dizzy, or get very short of breath. Exercises that burn about 150 calories:  Running 1  miles in 15 minutes.  Playing volleyball for 45 to 60 minutes.  Washing and waxing a car for 45 to 60 minutes.  Playing touch football for 45 minutes.  Walking 1  miles in 35 minutes.  Pushing a stroller 1  miles in 30 minutes.  Playing basketball for 30 minutes.  Raking leaves for 30 minutes.  Bicycling 5 miles in 30 minutes.  Walking 2 miles in 30 minutes.  Dancing for 30 minutes.  Shoveling snow for 15 minutes.  Swimming laps for 20 minutes.  Walking up stairs for 15 minutes.  Bicycling 4 miles in 15 minutes.  Gardening for 30 to 45 minutes.  Jumping rope for 15 minutes.  Washing windows or floors for 45 to 60 minutes. Document Released: 08/13/2010 Document Revised: 10/03/2011 Document Reviewed: 08/13/2010 [facility] Patient Information 2015 [place], [place]. This information is not intended to replace advice given to you by your health care provider. Make sure you discuss any questions you have with your health care provider.

## 2014-01-27 ENCOUNTER — Telehealth: Payer: Self-pay

## 2014-01-27 NOTE — Telephone Encounter (Signed)
Message copied by Dorothe Pea on Mon Jan 27, 2014 11:08 AM ------      Message from: Chari Manning A      Created: Wed Jan 22, 2014  9:57 PM       Let patient know labs are within normal limits. ------

## 2014-01-27 NOTE — Telephone Encounter (Signed)
Patient  Is aware of her lab results

## 2014-01-30 ENCOUNTER — Ambulatory Visit: Payer: No Typology Code available for payment source | Attending: Internal Medicine | Admitting: *Deleted

## 2014-01-30 VITALS — BP 152/104 | HR 93 | Temp 98.2°F | Resp 16 | Wt 228.0 lb

## 2014-01-30 DIAGNOSIS — J069 Acute upper respiratory infection, unspecified: Secondary | ICD-10-CM

## 2014-01-30 MED ORDER — AZITHROMYCIN 250 MG PO TABS: ORAL_TABLET | ORAL | Status: DC

## 2014-01-30 NOTE — Patient Instructions (Signed)
Continue with your log for your blood pressure. Bring your log back with you on each visit.

## 2014-01-30 NOTE — Progress Notes (Signed)
Patient here today for BP check. Patient forgot to bring her BP log. Patient states her systolic has been 326Z-124P and diastolic 80-998P. Patient also complains of being sick since last Tuesday. Patient states she has been coughing up thick mucus green and yellow. Patient also c/o runny nose also. Patient does have bilateral 1-2+ lower extremity edema. Patient has tried OTC guaffinesen, DayQuil, Nyquil with no relief.  Auscultation of lungs has wheezing on right lower lobes. Consulted with Roney Jaffe, NP. Verbal orders received for Zpack. Verbal orders for patient to return to clinic in 2 weeks for nurse visit to recheck swelling and BP. If swelling persist and BP still elevated per Roney Jaffe, NP will need come off Amlodipine.

## 2014-02-13 ENCOUNTER — Ambulatory Visit: Payer: No Typology Code available for payment source | Attending: Internal Medicine | Admitting: *Deleted

## 2014-02-13 VITALS — BP 134/89 | HR 92 | Wt 238.0 lb

## 2014-02-13 DIAGNOSIS — I10 Essential (primary) hypertension: Secondary | ICD-10-CM

## 2014-02-13 NOTE — Patient Instructions (Signed)

## 2014-02-13 NOTE — Progress Notes (Signed)
Patient here for BP check. Patient states BP has been running 140/90 and pulse has been 90 or above. Patient still having +1 edema in her her lower extremities. Patient states its more greater in the right than the left. Spoke with Roney Jaffe, NP who wants patient to stay on Amlodipine and return in 2 weeks for BP check and weight check. Patient informed of plan of care. Also informed patient to start doing daily weight checks. Patient verbalized understanding. Vivia Birmingham, RN

## 2014-02-27 ENCOUNTER — Ambulatory Visit: Payer: No Typology Code available for payment source | Attending: Internal Medicine | Admitting: *Deleted

## 2014-02-27 VITALS — BP 137/84 | HR 82 | Wt 236.0 lb

## 2014-02-27 DIAGNOSIS — I10 Essential (primary) hypertension: Secondary | ICD-10-CM

## 2014-02-27 NOTE — Progress Notes (Signed)
Patient here for BP check and weight check. Patient has presented weight log. August 3 morning weight was 230 pounds at 1000 and at 2300 weight was 241.2 pounds. On August 4 patient morning weight was 233.7 at 0930 and at 2330 242.1 pounds. Patient family history heart disease.  Consulted with Roney Jaffe, NP. Verbal order received for appointment with  Dr. Verl Blalock (cardiologist) for evaluation for fluid retention and shortness of breath. Vivia Birmingham, RN

## 2014-02-27 NOTE — Patient Instructions (Signed)
Daily Weight Record It is important to weigh yourself daily. Keep this daily weight chart near your scale. Weigh yourself each morning at the same time. Weigh yourself without shoes and with the same amount of clothes each day. Compare today's weight to yesterday's weight. Bring this form with you to your follow-up appointments. Call your caregiver if you gain 03 lb/1.4 kg in 1 day. Call your caregiver if you gain 05 lb/2.3 kg in a week. Date: ________ Weight: ____________________ Date: ________ Weight: ____________________ Date: ________ Weight: ____________________ Date: ________ Weight: ____________________ Date: ________ Weight: ____________________ Date: ________ Weight: ____________________ Date: ________ Weight: ____________________ Date: ________ Weight: ____________________ Date: ________ Weight: ____________________ Date: ________ Weight: ____________________ Date: ________ Weight: ____________________ Date: ________ Weight: ____________________ Date: ________ Weight: ____________________ Date: ________ Weight: ____________________ Date: ________ Weight: ____________________ Date: ________ Weight: ____________________ Date: ________ Weight: ____________________ Date: ________ Weight: ____________________ Date: ________ Weight: ____________________ Date: ________ Weight: ____________________ Date: ________ Weight: ____________________ Date: ________ Weight: ____________________ Date: ________ Weight: ____________________ Date: ________ Weight: ____________________ Date: ________ Weight: ____________________ Date: ________ Weight: ____________________ Date: ________ Weight: ____________________ Date: ________ Weight: ____________________ Date: ________ Weight: ____________________ Date: ________ Weight: ____________________ Date: ________ Weight: ____________________ Date: ________ Weight: ____________________ Date: ________ Weight: ____________________ Date: ________ Weight:  ____________________ Date: ________ Weight: ____________________ Date: ________ Weight: ____________________ Date: ________ Weight: ____________________ Date: ________ Weight: ____________________ Date: ________ Weight: ____________________ Date: ________ Weight: ____________________ Date: ________ Weight: ____________________ Date: ________ Weight: ____________________ Date: ________ Weight: ____________________ Date: ________ Weight: ____________________ Date: ________ Weight: ____________________ Date: ________ Weight: ____________________ Date: ________ Weight: ____________________ Date: ________ Weight: ____________________ Date: ________ Weight: ____________________ Date: ________ Weight: ____________________ Document Released: 09/22/2006 Document Revised: 10/03/2011 Document Reviewed: 06/29/2007 [employer] Patient Information 2015 [place] [place]. This information is not intended to replace advice given to you by your health care provider. Make sure you discuss any questions you have with your health care provider.

## 2014-03-04 ENCOUNTER — Telehealth: Payer: Self-pay | Admitting: Internal Medicine

## 2014-03-04 NOTE — Telephone Encounter (Signed)
Pt needs written birth control prescription at our [facility]. F/u with Pt.

## 2014-03-04 NOTE — Telephone Encounter (Signed)
Patient would like [place] write/ refill OCP for Ortho Cylen. Patient would like it to specify that she can skip the placebo pills. Patient is interested in a 3 month OCP if we have available and affordable. Vivia Birmingham, RN

## 2014-03-10 ENCOUNTER — Telehealth: Payer: Self-pay | Admitting: Internal Medicine

## 2014-03-10 ENCOUNTER — Ambulatory Visit: Payer: No Typology Code available for payment source | Attending: Internal Medicine

## 2014-03-10 NOTE — Telephone Encounter (Signed)
Patient has come in today to request a letter from the Doctor authorizing her to participate in physical activity (Yoga); patient has recently started volunteering at the local [employer];

## 2014-03-11 ENCOUNTER — Encounter: Payer: Self-pay | Admitting: *Deleted

## 2014-03-11 NOTE — Telephone Encounter (Signed)
Is this ok for [place] to write. Patient states she just needs a note stating that she is medically cleared to do exercises. She has an appointment tomorrow at 4 PM and would like to pick up letter then.

## 2014-03-11 NOTE — Telephone Encounter (Signed)
You may write letter clearing patient for exercise. Thanks.

## 2014-03-12 ENCOUNTER — Encounter: Payer: Self-pay | Admitting: Cardiology

## 2014-03-12 ENCOUNTER — Ambulatory Visit: Payer: No Typology Code available for payment source | Attending: Cardiology | Admitting: Cardiology

## 2014-03-12 DIAGNOSIS — Q231 Congenital insufficiency of aortic valve: Secondary | ICD-10-CM | POA: Insufficient documentation

## 2014-03-12 DIAGNOSIS — Z87891 Personal history of nicotine dependence: Secondary | ICD-10-CM | POA: Insufficient documentation

## 2014-03-12 DIAGNOSIS — I359 Nonrheumatic aortic valve disorder, unspecified: Secondary | ICD-10-CM | POA: Insufficient documentation

## 2014-03-12 DIAGNOSIS — R600 Localized edema: Secondary | ICD-10-CM

## 2014-03-12 DIAGNOSIS — F329 Major depressive disorder, single episode, unspecified: Secondary | ICD-10-CM | POA: Insufficient documentation

## 2014-03-12 DIAGNOSIS — F411 Generalized anxiety disorder: Secondary | ICD-10-CM | POA: Insufficient documentation

## 2014-03-12 DIAGNOSIS — Z79899 Other long term (current) drug therapy: Secondary | ICD-10-CM | POA: Insufficient documentation

## 2014-03-12 DIAGNOSIS — Q2381 Bicuspid aortic valve: Secondary | ICD-10-CM

## 2014-03-12 DIAGNOSIS — K219 Gastro-esophageal reflux disease without esophagitis: Secondary | ICD-10-CM | POA: Insufficient documentation

## 2014-03-12 DIAGNOSIS — I10 Essential (primary) hypertension: Secondary | ICD-10-CM | POA: Insufficient documentation

## 2014-03-12 DIAGNOSIS — R609 Edema, unspecified: Secondary | ICD-10-CM | POA: Insufficient documentation

## 2014-03-12 DIAGNOSIS — K589 Irritable bowel syndrome without diarrhea: Secondary | ICD-10-CM | POA: Insufficient documentation

## 2014-03-12 DIAGNOSIS — G43909 Migraine, unspecified, not intractable, without status migrainosus: Secondary | ICD-10-CM | POA: Insufficient documentation

## 2014-03-12 DIAGNOSIS — F3289 Other specified depressive episodes: Secondary | ICD-10-CM | POA: Insufficient documentation

## 2014-03-12 HISTORY — DX: Bicuspid aortic valve: Q23.81

## 2014-03-12 HISTORY — DX: Congenital insufficiency of aortic valve: Q23.1

## 2014-03-12 MED ORDER — LOSARTAN POTASSIUM 100 MG PO TABS: 100.0000 mg | ORAL_TABLET | Freq: Every day | ORAL | Status: DC

## 2014-03-12 NOTE — Assessment & Plan Note (Signed)
This appears to have gotten worse since she was switched to Norvasc. Some of this is also due to her weight.  Reviewed low-sodium diet which she seems to understand. I've also to stop her Norvasc and placed her on losartan 100 mg every morning. We'll monitor her blood pressure.

## 2014-03-12 NOTE — Progress Notes (Signed)
HPI Stephanie Miles is a 33 year old white female comes in today with concerns about fluid retention particularly her legs.  She notices particularly over the last month. Is worse at the end of the day. It tends to be better in the mornings. She denies any orthopnea, PND. Recent echocardiogram in March showed normal left ventricular chamber size, ejection fraction 60%, and a bicuspid aortic valve with 1-2+ aortic regurgitation. There was no evidence of pulmonary protection. She does have a history of hypertension and is on several medications. She was recently switched from lisinopril because of cough to amlodipine. She does seem to be fairly compliant with diet in terms of sodium and is trying to lose weight. She's also doing yoga. She is overweight but is quite muscular. She states she was in the [employer].  Past Medical History  Diagnosis Date  . Hypertension   . GERD (gastroesophageal reflux disease)   . IBS (irritable bowel syndrome)   . Migraine headache   . S/P appy 05/2013  . Anxiety   . Allergy   . Depression   . Heart murmur   . Chronic kidney disease     kidney stones    Current Outpatient Prescriptions  Medication Sig Dispense Refill  . ALPRAZolam (XANAX) 0.5 MG tablet Take 1 tablet (0.5 mg total) by mouth 2 (two) times daily as needed for anxiety.  30 tablet  0  . amLODipine (NORVASC) 10 MG tablet Take 1 tablet (10 mg total) by mouth daily.  30 tablet  1  . azithromycin (ZITHROMAX) 250 MG tablet Take 2 (tablets) today (01/30/2014). On 01/31/2014 take 1(one) tablet for the next four days.  6 tablet  0  . butalbital-acetaminophen-caffeine (FIORICET) 50-325-40 MG per tablet Take 1-2 tablets by mouth every 6 (six) hours as needed for headache.  20 tablet  0  . cetirizine (ZYRTEC) 10 MG tablet Take 10 mg by mouth daily.      . diazepam (VALIUM) 5 MG tablet Take 1 tablet (5 mg total) by mouth 2 (two) times daily.  20 tablet  0  . dicyclomine (BENTYL) 10 MG capsule Take 1 capsule (10 mg  total) by mouth 4 (four) times daily -  before meals and at bedtime.  120 capsule  11  . fluticasone (FLONASE) 50 MCG/ACT nasal spray Place 2 sprays into both nostrils daily.      . furosemide (LASIX) 40 MG tablet Take 1 tablet (40 mg total) by mouth every morning.  30 tablet  3  . methocarbamol (ROBAXIN) 500 MG tablet Take 1 tablet (500 mg total) by mouth 2 (two) times daily.  20 tablet  0  . naproxen sodium (ALEVE) 220 MG tablet Take 440 mg by mouth every evening.      . norgestimate-ethinyl estradiol (ORTHO-CYCLEN,SPRINTEC,PREVIFEM) 0.25-35 MG-MCG tablet Take 1 tablet by mouth every evening.      Marland Kitchen omeprazole (PRILOSEC) 20 MG capsule TAKE 1 CAPSULE BY MOUTH TWICE DAILY  60 capsule  11  . oxyCODONE-acetaminophen (PERCOCET/ROXICET) 5-325 MG per tablet Take 1 tablet by mouth every 4 (four) hours as needed for moderate pain.      Marland Kitchen oxyCODONE-acetaminophen (PERCOCET/ROXICET) 5-325 MG per tablet Take 2 tablets by mouth every 4 (four) hours as needed.  30 tablet  0  . potassium chloride (K-DUR,KLOR-CON) 10 MEQ tablet Take 1 tablet (10 mEq total) by mouth 2 (two) times daily.  30 tablet  3  . Prenatal Vit-Fe Sulfate-FA (PRENATAL VITAMIN PO) Take 1 tablet by mouth every evening.       Marland Kitchen  Probiotic Product (PROBIOTIC DAILY PO) Take 500 mg by mouth daily.       . promethazine (PHENERGAN) 12.5 MG tablet Take 2 tablets (25 mg total) by mouth every 6 (six) hours as needed for nausea.  30 tablet  0  . traZODone (DESYREL) 50 MG tablet Take 50 mg by mouth at bedtime.      Marland Kitchen venlafaxine (EFFEXOR) 50 MG tablet Take 50 mg by mouth 2 (two) times daily with a meal.        No current facility-administered medications for this visit.    Allergies  Allergen Reactions  . Codeine Nausea Only    Family History  Problem Relation Age of Onset  . COPD Mother   . Hypertension Mother   . Cancer Father   . Diabetes Father   . Hypertension Father   . Heart disease Father   . Hyperlipidemia Father   . Asthma Sister    . Hyperlipidemia Brother   . Hypertension Brother   . Vision loss Brother   . Colon cancer Maternal Grandmother   . Esophageal cancer Neg Hx   . Pancreatic cancer Neg Hx   . Rectal cancer Neg Hx   . Stomach cancer Neg Hx     History   Social History  . Marital Status: Single    Spouse Name: N/A    Number of Children: N/A  . Years of Education: N/A   Occupational History  . Not on file.   Social History Main Topics  . Smoking status: Former Smoker -- 1.00 packs/day for 20 years    Quit date: 02/22/2013  . Smokeless tobacco: Never Used  . Alcohol Use: Yes     Comment: social  . Drug Use: No  . Sexual Activity: Not on file   Other Topics Concern  . Not on file   Social History Narrative  . No narrative on file    ROS ALL NEGATIVE EXCEPT THOSE NOTED IN HPI  PE  General Appearance: well developed, well nourished in no acute distress, morbidly obese HEENT: symmetrical face, PERRLA, good dentition  Neck: no JVD, thyromegaly, or adenopathy, trachea midline Chest: symmetric without deformity Cardiac: PMI POORLY APPRECIATED, RRR, normal S1, S2, 2/6 diastolic blowing murmur left and right upper sternal border. Lung: clear to ausculation and percussion Vascular: all pulses full without bruits  Extremities: no cyanosis, 1+ pretibial edema, pitting, no sign of DVT, no varicosities  Skin: normal color, no rashes Neuro: alert and oriented x 3, non-focal Pysch: normal affect  EKG Not repeated BMET    Component Value Date/Time   NA 137 01/20/2014 0947   K 4.2 01/20/2014 0947   CL 105 01/20/2014 0947   CO2 25 01/20/2014 0947   GLUCOSE 132* 01/20/2014 0947   BUN 11 01/20/2014 0947   CREATININE 0.79 01/20/2014 0947   CREATININE 0.90 10/01/2013 1427   CALCIUM 8.8 01/20/2014 0947   GFRNONAA >89 01/20/2014 0947   GFRNONAA 82* 10/01/2013 1427   GFRAA >89 01/20/2014 0947   GFRAA >90 10/01/2013 1427    Lipid Panel  No results found for this basename: chol, trig, hdl, cholhdl,  vldl, ldlcalc    CBC    Component Value Date/Time   WBC 11.1* 10/01/2013 1427   RBC 5.00 10/01/2013 1427   HGB 14.1 10/01/2013 1427   HCT 42.4 10/01/2013 1427   PLT 308 10/01/2013 1427   MCV 84.8 10/01/2013 1427   MCH 28.2 10/01/2013 1427   MCHC 33.3 10/01/2013 1427   RDW  13.7 10/01/2013 1427   LYMPHSABS 3.3 10/01/2013 1427   MONOABS 0.7 10/01/2013 1427   EOSABS 0.3 10/01/2013 1427   BASOSABS 0.1 10/01/2013 1427

## 2014-03-12 NOTE — Patient Instructions (Signed)
Stop taking Norvasc today Start new prescribed Losartan 10 mg daily and return as needed

## 2014-03-12 NOTE — Assessment & Plan Note (Signed)
Recent echo results reviewed with patient. I do not think this is contributing to her edema. I've advised to followup with me on an annual basis to monitor this. I've also made her aware that this may have to require valve replacement in her 30s or 67s.

## 2014-03-18 ENCOUNTER — Other Ambulatory Visit: Payer: Self-pay | Admitting: Internal Medicine

## 2014-03-20 NOTE — Telephone Encounter (Signed)
If this has not been address, find out if she is considering Depo injections or if she wants to continue the pills.

## 2014-03-20 NOTE — Telephone Encounter (Signed)
Patient states she has already exceeded the number of doses to be on depo in a lifetime. So she would like to continue on pills.

## 2014-03-20 NOTE — Telephone Encounter (Signed)
Please f/u with Ms. Leonides Schanz TY

## 2014-04-02 ENCOUNTER — Other Ambulatory Visit: Payer: Self-pay | Admitting: Internal Medicine

## 2014-04-03 ENCOUNTER — Encounter: Payer: Self-pay | Admitting: Internal Medicine

## 2014-04-03 ENCOUNTER — Ambulatory Visit: Payer: No Typology Code available for payment source | Attending: Internal Medicine | Admitting: Internal Medicine

## 2014-04-03 VITALS — BP 139/89 | HR 83 | Temp 98.3°F | Resp 16 | Ht 66.0 in | Wt 234.0 lb

## 2014-04-03 DIAGNOSIS — G43909 Migraine, unspecified, not intractable, without status migrainosus: Secondary | ICD-10-CM | POA: Insufficient documentation

## 2014-04-03 DIAGNOSIS — Z23 Encounter for immunization: Secondary | ICD-10-CM | POA: Insufficient documentation

## 2014-04-03 DIAGNOSIS — F3289 Other specified depressive episodes: Secondary | ICD-10-CM | POA: Insufficient documentation

## 2014-04-03 DIAGNOSIS — L538 Other specified erythematous conditions: Secondary | ICD-10-CM | POA: Insufficient documentation

## 2014-04-03 DIAGNOSIS — F329 Major depressive disorder, single episode, unspecified: Secondary | ICD-10-CM | POA: Insufficient documentation

## 2014-04-03 DIAGNOSIS — K589 Irritable bowel syndrome without diarrhea: Secondary | ICD-10-CM | POA: Insufficient documentation

## 2014-04-03 DIAGNOSIS — R011 Cardiac murmur, unspecified: Secondary | ICD-10-CM | POA: Insufficient documentation

## 2014-04-03 DIAGNOSIS — F411 Generalized anxiety disorder: Secondary | ICD-10-CM | POA: Insufficient documentation

## 2014-04-03 DIAGNOSIS — N200 Calculus of kidney: Secondary | ICD-10-CM | POA: Insufficient documentation

## 2014-04-03 DIAGNOSIS — L304 Erythema intertrigo: Secondary | ICD-10-CM

## 2014-04-03 DIAGNOSIS — Z3040 Encounter for surveillance of contraceptives, unspecified: Secondary | ICD-10-CM | POA: Insufficient documentation

## 2014-04-03 DIAGNOSIS — I10 Essential (primary) hypertension: Secondary | ICD-10-CM | POA: Insufficient documentation

## 2014-04-03 DIAGNOSIS — Z79899 Other long term (current) drug therapy: Secondary | ICD-10-CM | POA: Insufficient documentation

## 2014-04-03 DIAGNOSIS — Z3009 Encounter for other general counseling and advice on contraception: Secondary | ICD-10-CM

## 2014-04-03 DIAGNOSIS — E876 Hypokalemia: Secondary | ICD-10-CM | POA: Insufficient documentation

## 2014-04-03 DIAGNOSIS — Z8249 Family history of ischemic heart disease and other diseases of the circulatory system: Secondary | ICD-10-CM | POA: Insufficient documentation

## 2014-04-03 DIAGNOSIS — K219 Gastro-esophageal reflux disease without esophagitis: Secondary | ICD-10-CM | POA: Insufficient documentation

## 2014-04-03 DIAGNOSIS — Z885 Allergy status to narcotic agent status: Secondary | ICD-10-CM | POA: Insufficient documentation

## 2014-04-03 DIAGNOSIS — Z87891 Personal history of nicotine dependence: Secondary | ICD-10-CM | POA: Insufficient documentation

## 2014-04-03 MED ORDER — POTASSIUM CHLORIDE ER 10 MEQ PO TBCR: EXTENDED_RELEASE_TABLET | ORAL | Status: DC

## 2014-04-03 MED ORDER — NORGESTIMATE-ETH ESTRADIOL 0.25-35 MG-MCG PO TABS: 1.0000 | ORAL_TABLET | Freq: Every evening | ORAL | Status: DC

## 2014-04-03 MED ORDER — NYSTATIN 100000 UNIT/GM EX CREA: 1.0000 "application " | TOPICAL_CREAM | Freq: Two times a day (BID) | CUTANEOUS | Status: DC

## 2014-04-03 NOTE — Progress Notes (Signed)
Patient ID: Stephanie Miles, female   DOB: [birth year], 33 y.o.   MRN: [id number]  CC:  3 mo HTN follow up  HPI:  Patient presents to clinic today for a 3 month follow up for hypertension.  Patient reports that she went to see Dr. Verl Blalock and was switched to losartan 146m QD and her swelling has improved.  She is compliant with medications.  She has concerns of right foot discomfort.  She belives she has a plantar wart that is causing her pain when walking on campus.  She also reports that she often gets a rash under bilateral breast that becomes red and itchy.  She states she had a anti-fungal cream in the past to help.   Patient complains that she is unable to lose weight although she is exercising multiple times per week.  She is able to explain to me the necessary dietary changes and the difference in carbs taught to her by her friends.   Allergies  Allergen Reactions  . Codeine Nausea Only   Past Medical History  Diagnosis Date  . Hypertension   . GERD (gastroesophageal reflux disease)   . IBS (irritable bowel syndrome)   . Migraine headache   . S/P appy 05/2013  . Anxiety   . Allergy   . Depression   . Heart murmur   . Chronic kidney disease     kidney stones   Current Outpatient Prescriptions on File Prior to Visit  Medication Sig Dispense Refill  . butalbital-acetaminophen-caffeine (FIORICET) 50-325-40 MG per tablet Take 1-2 tablets by mouth every 6 (six) hours as needed for headache.  20 tablet  0  . cetirizine (ZYRTEC) 10 MG tablet Take 10 mg by mouth daily.      .Marland Kitchendicyclomine (BENTYL) 10 MG capsule Take 1 capsule (10 mg total) by mouth 4 (four) times daily -  before meals and at bedtime.  120 capsule  11  . fluticasone (FLONASE) 50 MCG/ACT nasal spray Place 2 sprays into both nostrils daily.      . furosemide (LASIX) 40 MG tablet Take 1 tablet (40 mg total) by mouth every morning.  30 tablet  3  . losartan (COZAAR) 100 MG tablet Take 1 tablet (100 mg total) by mouth daily.  90  tablet  3  . naproxen sodium (ALEVE) 220 MG tablet Take 440 mg by mouth every evening.      . norgestimate-ethinyl estradiol (ORTHO-CYCLEN,SPRINTEC,PREVIFEM) 0.25-35 MG-MCG tablet Take 1 tablet by mouth every evening.      .Marland Kitchenomeprazole (PRILOSEC) 20 MG capsule TAKE 1 CAPSULE BY MOUTH TWICE DAILY  60 capsule  11  . potassium chloride (K-DUR) 10 MEQ tablet TAKE 1 TABLET BY MOUTH 2 TIMES DAILY.  30 tablet  3  . Prenatal Vit-Fe Sulfate-FA (PRENATAL VITAMIN PO) Take 1 tablet by mouth every evening.       . Probiotic Product (PROBIOTIC DAILY PO) Take 500 mg by mouth daily.       . traZODone (DESYREL) 50 MG tablet Take 50 mg by mouth at bedtime.      .Marland Kitchenvenlafaxine (EFFEXOR) 50 MG tablet Take 50 mg by mouth 2 (two) times daily with a meal.       . ALPRAZolam (XANAX) 0.5 MG tablet Take 1 tablet (0.5 mg total) by mouth 2 (two) times daily as needed for anxiety.  30 tablet  0  . azithromycin (ZITHROMAX) 250 MG tablet Take 2 (tablets) today (01/30/2014). On 01/31/2014 take 1(one) tablet for the next four days.  6 tablet  0  . diazepam (VALIUM) 5 MG tablet Take 1 tablet (5 mg total) by mouth 2 (two) times daily.  20 tablet  0  . methocarbamol (ROBAXIN) 500 MG tablet Take 1 tablet (500 mg total) by mouth 2 (two) times daily.  20 tablet  0  . oxyCODONE-acetaminophen (PERCOCET/ROXICET) 5-325 MG per tablet Take 1 tablet by mouth every 4 (four) hours as needed for moderate pain.      Marland Kitchen oxyCODONE-acetaminophen (PERCOCET/ROXICET) 5-325 MG per tablet Take 2 tablets by mouth every 4 (four) hours as needed.  30 tablet  0  . promethazine (PHENERGAN) 12.5 MG tablet Take 2 tablets (25 mg total) by mouth every 6 (six) hours as needed for nausea.  30 tablet  0  . [DISCONTINUED] potassium chloride (K-DUR,KLOR-CON) 10 MEQ tablet Take 1 tablet (10 mEq total) by mouth 2 (two) times daily.  30 tablet  3   No current facility-administered medications on file prior to visit.   Family History  Problem Relation Age of Onset  . COPD  Mother   . Hypertension Mother   . Cancer Father   . Diabetes Father   . Hypertension Father   . Heart disease Father   . Hyperlipidemia Father   . Asthma Sister   . Hyperlipidemia Brother   . Hypertension Brother   . Vision loss Brother   . Colon cancer Maternal Grandmother   . Esophageal cancer Neg Hx   . Pancreatic cancer Neg Hx   . Rectal cancer Neg Hx   . Stomach cancer Neg Hx    History   Social History  . Marital Status: Single    Spouse Name: N/A    Number of Children: N/A  . Years of Education: N/A   Occupational History  . Not on file.   Social History Main Topics  . Smoking status: Former Smoker -- 1.00 packs/day for 20 years    Quit date: 02/22/2013  . Smokeless tobacco: Never Used  . Alcohol Use: Yes     Comment: social  . Drug Use: No  . Sexual Activity: Not on file   Other Topics Concern  . Not on file   Social History Narrative  . No narrative on file   Review of Systems  Constitutional: Weight loss: weight gain.  Eyes: Positive for blurred vision (appt next week ).  Cardiovascular: Positive for leg swelling (improving).       Pain under breast   Neurological: Negative for dizziness, tingling and headaches.     Objective:   Filed Vitals:   04/03/14 1624  BP: 139/89  Pulse: 83  Temp: 98.3 F (36.8 C)  Resp: 16    Physical Exam: Constitutional: Patient appears well-developed and well-nourished. No distress. Neck: Normal ROM. Neck supple. No JVD. No tracheal deviation. No thyromegaly. CVS: RRR, S1/S2 +, no murmurs, no gallops, no carotid bruit.  Pulmonary: Effort and breath sounds normal, no stridor, rhonchi, wheezes, rales.  Musculoskeletal: Normal range of motion. No edema and no tenderness.  Lymphadenopathy: No lymphadenopathy noted, cervical, Neuro: Alert. Normal reflexes,  Skin: Skin is warm and dry. No rash noted. Not diaphoretic. No erythema. No pallor. Psychiatric: Normal mood and affect. Behavior, judgment, thought content  normal.  Lab Results  Component Value Date   WBC 11.1* 10/01/2013   HGB 14.1 10/01/2013   HCT 42.4 10/01/2013   MCV 84.8 10/01/2013   PLT 308 10/01/2013   Lab Results  Component Value Date   CREATININE 0.79 01/20/2014  BUN 11 01/20/2014   NA 137 01/20/2014   K 4.2 01/20/2014   CL 105 01/20/2014   CO2 25 01/20/2014    No results found for this basename: HGBA1C   Lipid Panel  No results found for this basename: chol, trig, hdl, cholhdl, vldl, ldlcalc       Assessment and plan:   Lashunta was seen today for follow-up.  Diagnoses and associated orders for this visit:  Hypertension Continue Losartan-BP improved   Hypokalemia - potassium chloride (K-DUR) 10 MEQ tablet; TAKE 1 TABLET BY MOUTH 2 TIMES DAILY.  Intertrigo - nystatin cream (MYCOSTATIN); Apply 1 application topically 2 (two) times daily.  Birth control counseling - Begin norgestimate-ethinyl estradiol (ORTHO-CYCLEN,SPRINTEC,PREVIFEM) 0.25-35 MG-MCG tablet; Take 1 tablet by mouth every evening. Explain associated risk of OCP.   Need for prophylactic vaccination and inoculation against influenza Received    Return in about 3 months (around 07/03/2014) for Hypertension.       Chari Manning, NP-C [facility] [phone] 04/20/2014, 9:47 PM

## 2014-04-03 NOTE — Progress Notes (Signed)
Pt is here following up on her HTN. Pt needs to revise her medications. Pt reports having a plantars wart on her left foot. Pt is having frequent rashes under her breast.

## 2014-04-23 ENCOUNTER — Ambulatory Visit: Payer: No Typology Code available for payment source | Admitting: Internal Medicine

## 2014-05-13 ENCOUNTER — Other Ambulatory Visit: Payer: Self-pay | Admitting: Internal Medicine

## 2014-06-04 ENCOUNTER — Ambulatory Visit: Payer: No Typology Code available for payment source | Attending: Internal Medicine | Admitting: Internal Medicine

## 2014-06-04 ENCOUNTER — Encounter: Payer: Self-pay | Admitting: Internal Medicine

## 2014-06-04 VITALS — BP 121/85 | HR 93 | Temp 99.4°F | Resp 16 | Ht 66.0 in | Wt 230.0 lb

## 2014-06-04 DIAGNOSIS — J069 Acute upper respiratory infection, unspecified: Secondary | ICD-10-CM | POA: Insufficient documentation

## 2014-06-04 DIAGNOSIS — F329 Major depressive disorder, single episode, unspecified: Secondary | ICD-10-CM | POA: Insufficient documentation

## 2014-06-04 DIAGNOSIS — R51 Headache: Secondary | ICD-10-CM | POA: Insufficient documentation

## 2014-06-04 DIAGNOSIS — K219 Gastro-esophageal reflux disease without esophagitis: Secondary | ICD-10-CM | POA: Insufficient documentation

## 2014-06-04 DIAGNOSIS — F419 Anxiety disorder, unspecified: Secondary | ICD-10-CM | POA: Insufficient documentation

## 2014-06-04 DIAGNOSIS — I10 Essential (primary) hypertension: Secondary | ICD-10-CM | POA: Insufficient documentation

## 2014-06-04 DIAGNOSIS — Z87891 Personal history of nicotine dependence: Secondary | ICD-10-CM | POA: Insufficient documentation

## 2014-06-04 DIAGNOSIS — K589 Irritable bowel syndrome without diarrhea: Secondary | ICD-10-CM | POA: Insufficient documentation

## 2014-06-04 DIAGNOSIS — Z79899 Other long term (current) drug therapy: Secondary | ICD-10-CM | POA: Insufficient documentation

## 2014-06-04 DIAGNOSIS — R062 Wheezing: Secondary | ICD-10-CM | POA: Insufficient documentation

## 2014-06-04 DIAGNOSIS — Z791 Long term (current) use of non-steroidal anti-inflammatories (NSAID): Secondary | ICD-10-CM | POA: Insufficient documentation

## 2014-06-04 LAB — POCT RAPID STREP A (OFFICE): RAPID STREP A SCREEN: NEGATIVE

## 2014-06-04 MED ORDER — AMOXICILLIN 500 MG PO CAPS: 500.0000 mg | ORAL_CAPSULE | Freq: Three times a day (TID) | ORAL | Status: DC

## 2014-06-04 MED ORDER — ALBUTEROL SULFATE (2.5 MG/3ML) 0.083% IN NEBU
2.5000 mg | INHALATION_SOLUTION | Freq: Once | RESPIRATORY_TRACT | Status: AC
Start: 2014-06-04 — End: 2014-06-04
  Administered 2014-06-04: 2.5 mg via RESPIRATORY_TRACT

## 2014-06-04 MED ORDER — PREDNISONE 20 MG PO TABS: 20.0000 mg | ORAL_TABLET | Freq: Every day | ORAL | Status: DC

## 2014-06-04 NOTE — Patient Instructions (Signed)
Symptomatically treat for next three days before deciding to use antibiotics.  May use hot tea with honey and lemon for throat irritation, humidifiers, warm steam baths, and OTC cold treatments   Upper Respiratory Infection, Adult An upper respiratory infection (URI) is also sometimes known as the common cold. The upper respiratory tract includes the nose, sinuses, throat, trachea, and bronchi. Bronchi are the airways leading to the lungs. Most people improve within 1 week, but symptoms can last up to 2 weeks. A residual cough may last even longer.  CAUSES Many different viruses can infect the tissues lining the upper respiratory tract. The tissues become irritated and inflamed and often become very moist. Mucus production is also common. A cold is contagious. You can easily spread the virus to others by oral contact. This includes kissing, sharing a glass, coughing, or sneezing. Touching your mouth or nose and then touching a surface, which is then touched by another person, can also spread the virus. SYMPTOMS  Symptoms typically develop 1 to 3 days after you come in contact with a cold virus. Symptoms vary from person to person. They may include:  Runny nose.  Sneezing.  Nasal congestion.  Sinus irritation.  Sore throat.  Loss of voice (laryngitis).  Cough.  Fatigue.  Muscle aches.  Loss of appetite.  Headache.  Low-grade fever. DIAGNOSIS  You might diagnose your own cold based on familiar symptoms, since most people get a cold 2 to 3 times a year. Your caregiver can confirm this based on your exam. Most importantly, your caregiver can check that your symptoms are not due to another disease such as strep throat, sinusitis, pneumonia, asthma, or epiglottitis. Blood tests, throat tests, and X-rays are not necessary to diagnose a common cold, but they may sometimes be helpful in excluding other more serious diseases. Your caregiver will decide if any further tests are  required. RISKS AND COMPLICATIONS  You may be at risk for a more severe case of the common cold if you smoke cigarettes, have chronic heart disease (such as heart failure) or lung disease (such as asthma), or if you have a weakened immune system. The very young and very old are also at risk for more serious infections. Bacterial sinusitis, middle ear infections, and bacterial pneumonia can complicate the common cold. The common cold can worsen asthma and chronic obstructive pulmonary disease (COPD). Sometimes, these complications can require emergency medical care and may be life-threatening. PREVENTION  The best way to protect against getting a cold is to practice good hygiene. Avoid oral or hand contact with people with cold symptoms. Wash your hands often if contact occurs. There is no clear evidence that vitamin C, vitamin E, echinacea, or exercise reduces the chance of developing a cold. However, it is always recommended to get plenty of rest and practice good nutrition. TREATMENT  Treatment is directed at relieving symptoms. There is no cure. Antibiotics are not effective, because the infection is caused by a virus, not by bacteria. Treatment may include:  Increased fluid intake. Sports drinks offer valuable electrolytes, sugars, and fluids.  Breathing heated mist or steam (vaporizer or shower).  Eating chicken soup or other clear broths, and maintaining good nutrition.  Getting plenty of rest.  Using gargles or lozenges for comfort.  Controlling fevers with ibuprofen or acetaminophen as directed by your caregiver.  Increasing usage of your inhaler if you have asthma. Zinc gel and zinc lozenges, taken in the first 24 hours of the common cold, can shorten the  duration and lessen the severity of symptoms. Pain medicines may help with fever, muscle aches, and throat pain. A variety of non-prescription medicines are available to treat congestion and runny nose. Your caregiver can make  recommendations and may suggest nasal or lung inhalers for other symptoms.  HOME CARE INSTRUCTIONS   Only take over-the-counter or prescription medicines for pain, discomfort, or fever as directed by your caregiver.  Use a warm mist humidifier or inhale steam from a shower to increase air moisture. This may keep secretions moist and make it easier to breathe.  Drink enough water and fluids to keep your urine clear or pale yellow.  Rest as needed.  Return to work when your temperature has returned to normal or as your caregiver advises. You may need to stay home longer to avoid infecting others. You can also use a face mask and careful hand washing to prevent spread of the virus. SEEK MEDICAL CARE IF:   After the first few days, you feel you are getting worse rather than better.  You need your caregiver's advice about medicines to control symptoms.  You develop chills, worsening shortness of breath, or brown or red sputum. These may be signs of pneumonia.  You develop yellow or brown nasal discharge or pain in the face, especially when you bend forward. These may be signs of sinusitis.  You develop a fever, swollen neck glands, pain with swallowing, or white areas in the back of your throat. These may be signs of strep throat. SEEK IMMEDIATE MEDICAL CARE IF:   You have a fever.  You develop severe or persistent headache, ear pain, sinus pain, or chest pain.  You develop wheezing, a prolonged cough, cough up blood, or have a change in your usual mucus (if you have chronic lung disease).  You develop sore muscles or a stiff neck. Document Released: 01/04/2001 Document Revised: 10/03/2011 Document Reviewed: 10/16/2013 [facility] Patient Information 2015 [place], [place]. This information is not intended to replace advice given to you by your health care provider. Make sure you discuss any questions you have with your health care provider.

## 2014-06-04 NOTE — Progress Notes (Signed)
Pt is here today b/c she has been sick for a few days and today she could barely get out of bed.

## 2014-06-04 NOTE — Progress Notes (Signed)
Patient ID: Stephanie Miles, female   DOB: 1981/05/15, 33 y.o.   MRN: [id number]   CC: sore throat  HPI:  For one week patient reports that she has had overall malaise and sore throat.  She reports that after using her CPAP machine last night she woke up with wheezing and headache.  Denies cough, rhinitis, sneezing. She takes zyrtec and flonase daily.  Headaches on temporal area with some ringing of the ears. Denies sick exposures.  Denies nausea and vomiting.    Allergies  Allergen Reactions  . Codeine Nausea Only   Past Medical History  Diagnosis Date  . Hypertension   . GERD (gastroesophageal reflux disease)   . IBS (irritable bowel syndrome)   . Migraine headache   . S/P appy 05/2013  . Anxiety   . Allergy   . Depression   . Heart murmur   . Chronic kidney disease     kidney stones   Current Outpatient Prescriptions on File Prior to Visit  Medication Sig Dispense Refill  . cetirizine (ZYRTEC) 10 MG tablet Take 10 mg by mouth daily.    Marland Kitchen dicyclomine (BENTYL) 10 MG capsule Take 1 capsule (10 mg total) by mouth 4 (four) times daily -  before meals and at bedtime. 120 capsule 11  . fluticasone (FLONASE) 50 MCG/ACT nasal spray Place 2 sprays into both nostrils daily.    . furosemide (LASIX) 40 MG tablet TAKE 1 TABLET BY MOUTH EVERY MORNING. 30 tablet 3  . losartan (COZAAR) 100 MG tablet Take 1 tablet (100 mg total) by mouth daily. 90 tablet 3  . naproxen sodium (ALEVE) 220 MG tablet Take 440 mg by mouth every evening.    . norgestimate-ethinyl estradiol (ORTHO-CYCLEN,SPRINTEC,PREVIFEM) 0.25-35 MG-MCG tablet Take 1 tablet by mouth every evening. 1 Package 10  . nystatin cream (MYCOSTATIN) Apply 1 application topically 2 (two) times daily. 30 g 1  . omeprazole (PRILOSEC) 20 MG capsule TAKE 1 CAPSULE BY MOUTH TWICE DAILY 60 capsule 11  . potassium chloride (K-DUR) 10 MEQ tablet TAKE 1 TABLET BY MOUTH 2 TIMES DAILY. 60 tablet 3  . Prenatal Vit-Fe Sulfate-FA (PRENATAL VITAMIN PO) Take 1  tablet by mouth every evening.     . Probiotic Product (PROBIOTIC DAILY PO) Take 500 mg by mouth daily.     . traZODone (DESYREL) 50 MG tablet Take 50 mg by mouth at bedtime.    Marland Kitchen venlafaxine (EFFEXOR) 50 MG tablet Take 50 mg by mouth 2 (two) times daily with a meal.     . [DISCONTINUED] potassium chloride (K-DUR,KLOR-CON) 10 MEQ tablet Take 1 tablet (10 mEq total) by mouth 2 (two) times daily. 30 tablet 3   No current facility-administered medications on file prior to visit.   Family History  Problem Relation Age of Onset  . COPD Mother   . Hypertension Mother   . Cancer Father   . Diabetes Father   . Hypertension Father   . Heart disease Father   . Hyperlipidemia Father   . Asthma Sister   . Hyperlipidemia Brother   . Hypertension Brother   . Vision loss Brother   . Colon cancer Maternal Grandmother   . Esophageal cancer Neg Hx   . Pancreatic cancer Neg Hx   . Rectal cancer Neg Hx   . Stomach cancer Neg Hx    History   Social History  . Marital Status: Single    Spouse Name: N/A    Number of Children: N/A  . Years of Education:  N/A   Occupational History  . Not on file.   Social History Main Topics  . Smoking status: Former Smoker -- 1.00 packs/day for 20 years    Quit date: 02/22/2013  . Smokeless tobacco: Never Used  . Alcohol Use: Yes     Comment: social  . Drug Use: No  . Sexual Activity: Not on file   Other Topics Concern  . Not on file   Social History Narrative    Review of Systems  Constitutional: Positive for malaise/fatigue. Negative for fever and chills.  HENT: Positive for sore throat.   Neurological: Positive for weakness.  All other systems reviewed and are negative.    Objective:   Filed Vitals:   06/04/14 1449  BP: 121/85  Pulse: 93  Temp: 99.4 F (37.4 C)  Resp: 16    Physical Exam  Constitutional: No distress.  HENT:  Right Ear: External ear normal.  Left Ear: External ear normal.  Mouth/Throat: Oropharynx is clear and  moist.  Eyes: Conjunctivae are normal. Pupils are equal, round, and reactive to light. Right eye exhibits no discharge. Left eye exhibits no discharge.  Cardiovascular: Normal rate, regular rhythm and normal heart sounds.   Pulmonary/Chest: Effort normal. She has wheezes (Bilateral lung bases ).  Skin: Skin is warm and dry. She is not diaphoretic.  Vitals reviewed.    Lab Results  Component Value Date   WBC 11.1* 10/01/2013   HGB 14.1 10/01/2013   HCT 42.4 10/01/2013   MCV 84.8 10/01/2013   PLT 308 10/01/2013   Lab Results  Component Value Date   CREATININE 0.79 01/20/2014   BUN 11 01/20/2014   NA 137 01/20/2014   K 4.2 01/20/2014   CL 105 01/20/2014   CO2 25 01/20/2014    No results found for: HGBA1C Lipid Panel  No results found for: CHOL, TRIG, HDL, CHOLHDL, VLDL, LDLCALC     Assessment and plan:   Stephanie Miles was seen today for follow-up.  Diagnoses and associated orders for this visit:  Acute upper respiratory infection - Rapid Strep A---negative  - amoxicillin (AMOXIL) 500 MG capsule; Take 1 capsule (500 mg total) by mouth 3 (three) times daily.  Watch and wait before filling medication  - Throat culture ([facility])  Wheezing - albuterol (PROVENTIL) (2.5 MG/3ML) 0.083% nebulizer solution 2.5 mg; Take 3 mLs (2.5 mg total) by nebulization once. - predniSONE (DELTASONE) 20 MG tablet; Take 1 tablet (20 mg total) by mouth daily with breakfast.  To help with inflammation of airways  Patient will RTC in one week if no improvement         Stephanie Manning, NP-C [facility] [phone] 06/04/2014, 3:24 PM

## 2014-06-06 LAB — CULTURE, GROUP A STREP: ORGANISM ID, BACTERIA: NORMAL

## 2014-06-09 ENCOUNTER — Telehealth: Payer: Self-pay | Admitting: *Deleted

## 2014-06-09 NOTE — Telephone Encounter (Signed)
-----   Message from Lance Bosch, NP sent at 06/08/2014 10:37 PM EST ----- Negative throat culture

## 2014-06-09 NOTE — Telephone Encounter (Signed)
Pt is aware of her culture results.

## 2014-06-16 ENCOUNTER — Other Ambulatory Visit: Payer: Self-pay

## 2014-06-16 ENCOUNTER — Ambulatory Visit: Payer: No Typology Code available for payment source | Attending: Cardiology | Admitting: Internal Medicine

## 2014-06-16 ENCOUNTER — Ambulatory Visit (HOSPITAL_COMMUNITY)
Admission: RE | Admit: 2014-06-16 | Discharge: 2014-06-16 | Disposition: A | Discharge status: Home / Self Care | Payer: No Typology Code available for payment source | Source: Ambulatory Visit | Attending: Internal Medicine | Admitting: Internal Medicine

## 2014-06-16 VITALS — BP 126/84 | HR 92 | Temp 98.2°F | Resp 16

## 2014-06-16 DIAGNOSIS — R05 Cough: Secondary | ICD-10-CM | POA: Insufficient documentation

## 2014-06-16 DIAGNOSIS — R059 Cough, unspecified: Secondary | ICD-10-CM

## 2014-06-16 DIAGNOSIS — R079 Chest pain, unspecified: Secondary | ICD-10-CM | POA: Insufficient documentation

## 2014-06-16 DIAGNOSIS — R0602 Shortness of breath: Secondary | ICD-10-CM | POA: Insufficient documentation

## 2014-06-16 MED ORDER — GUAIFENESIN 100 MG/5ML PO SOLN: 5.0000 mL | ORAL | Status: DC | PRN

## 2014-06-16 MED ORDER — AZITHROMYCIN 250 MG PO TABS: ORAL_TABLET | ORAL | Status: DC

## 2014-06-16 NOTE — Progress Notes (Signed)
Patient walked in for continued shortness of breath States she saw PCP 3 weeks ago for URI and was given prednisone and amoxicillin States she started feeling better after 2 days on prednisone but that only lasted one day States she then began amoxicillin next day and has completed both meds but now feels worse Also, c/o clear nasal drainage, headaches, body aches, and cough occasionally productive of small amount yellow/green sputum States it feels like there is something heavy sitting on her chest: rates pain 6/10 at present States it is difficult to take a full breath Patient reports 3 pillow orthopnea which she says is normal for her Patient states both her mom and sister have asthma and she has used duoneb via nebulizer with some relief States it helps to take a deeper breath but does not stop cough   BP 126/84 P 92 R 16 T 98.2 oral SPO2 95%  ECG done and reviewed by provider  Tenderness palpated at maxillary and frontal sinuses Ears: clear bilaterally without redness or drainage; tympanic membranes intact without bulging Throat without redness Lungs: diminished breath sounds noted throughout but no adventitious sounds auscultated  Per Provider: Z-pack Robitussin Chest x-ray F/u with PCP in 1 week  RXs e-scribed to [facility]

## 2014-06-17 ENCOUNTER — Telehealth: Payer: Self-pay | Admitting: *Deleted

## 2014-06-17 NOTE — Telephone Encounter (Signed)
-----   Message from Tresa Garter, MD sent at 06/17/2014 11:36 AM EST ----- Please inform patient that her chest x-ray shows minimal infiltrate which is possible pneumonia. Advise patient to complete the Z-Pak, report to clinic if cough worsened or fever develops.

## 2014-06-17 NOTE — Telephone Encounter (Signed)
Patient notified of results and instructions Appt has already been made for next Tuesday, 06/24/14 with PCP

## 2014-06-24 ENCOUNTER — Ambulatory Visit: Payer: No Typology Code available for payment source | Attending: Internal Medicine | Admitting: Internal Medicine

## 2014-06-24 ENCOUNTER — Encounter: Payer: Self-pay | Admitting: Internal Medicine

## 2014-06-24 VITALS — BP 127/89 | HR 100 | Temp 98.3°F | Resp 18 | Ht 66.0 in | Wt 237.0 lb

## 2014-06-24 DIAGNOSIS — I10 Essential (primary) hypertension: Secondary | ICD-10-CM | POA: Insufficient documentation

## 2014-06-24 DIAGNOSIS — Z79899 Other long term (current) drug therapy: Secondary | ICD-10-CM | POA: Insufficient documentation

## 2014-06-24 DIAGNOSIS — J3089 Other allergic rhinitis: Secondary | ICD-10-CM | POA: Insufficient documentation

## 2014-06-24 DIAGNOSIS — G43909 Migraine, unspecified, not intractable, without status migrainosus: Secondary | ICD-10-CM | POA: Insufficient documentation

## 2014-06-24 DIAGNOSIS — R110 Nausea: Secondary | ICD-10-CM | POA: Insufficient documentation

## 2014-06-24 DIAGNOSIS — K219 Gastro-esophageal reflux disease without esophagitis: Secondary | ICD-10-CM | POA: Insufficient documentation

## 2014-06-24 DIAGNOSIS — F419 Anxiety disorder, unspecified: Secondary | ICD-10-CM | POA: Insufficient documentation

## 2014-06-24 DIAGNOSIS — Z793 Long term (current) use of hormonal contraceptives: Secondary | ICD-10-CM | POA: Insufficient documentation

## 2014-06-24 DIAGNOSIS — K589 Irritable bowel syndrome without diarrhea: Secondary | ICD-10-CM | POA: Insufficient documentation

## 2014-06-24 DIAGNOSIS — Z791 Long term (current) use of non-steroidal anti-inflammatories (NSAID): Secondary | ICD-10-CM | POA: Insufficient documentation

## 2014-06-24 DIAGNOSIS — Z87891 Personal history of nicotine dependence: Secondary | ICD-10-CM | POA: Insufficient documentation

## 2014-06-24 DIAGNOSIS — F329 Major depressive disorder, single episode, unspecified: Secondary | ICD-10-CM | POA: Insufficient documentation

## 2014-06-24 DIAGNOSIS — J4531 Mild persistent asthma with (acute) exacerbation: Secondary | ICD-10-CM | POA: Insufficient documentation

## 2014-06-24 DIAGNOSIS — Z7951 Long term (current) use of inhaled steroids: Secondary | ICD-10-CM | POA: Insufficient documentation

## 2014-06-24 MED ORDER — ONDANSETRON 4 MG PO TBDP: 4.0000 mg | ORAL_TABLET | Freq: Three times a day (TID) | ORAL | Status: DC | PRN

## 2014-06-24 MED ORDER — BECLOMETHASONE DIPROPIONATE 40 MCG/ACT IN AERS: 2.0000 | INHALATION_SPRAY | Freq: Two times a day (BID) | RESPIRATORY_TRACT | Status: DC

## 2014-06-24 MED ORDER — ALBUTEROL SULFATE HFA 108 (90 BASE) MCG/ACT IN AERS: 2.0000 | INHALATION_SPRAY | Freq: Four times a day (QID) | RESPIRATORY_TRACT | Status: DC | PRN

## 2014-06-24 NOTE — Progress Notes (Signed)
F/U Pneumonia Complaining of body ache, sweating  SOB, dry cough  Stated finish antibiotic,

## 2014-06-24 NOTE — Progress Notes (Signed)
Patient ID: Stephanie Miles, female   DOB: Feb 24, 1981, 33 y.o.   MRN: [id number]  CC: follow up   HPI:  Patient presents to clinic today to follow up on URI.  She states that she has continued to have SOB, dry cough, body aches, and sweating.  She was evaluated here three weeks ago for similar symptoms and was given Amoxicillin which she has completed.  She states that she came back a week later and was given azithromycin and cough medication. She is concerned as to why she is not having improvement.  She continues to have nausea, chills, and headaches.    Allergies  Allergen Reactions  . Codeine Nausea Only   Past Medical History  Diagnosis Date  . Hypertension   . GERD (gastroesophageal reflux disease)   . IBS (irritable bowel syndrome)   . Migraine headache   . S/P appy 05/2013  . Anxiety   . Allergy   . Depression   . Heart murmur   . Chronic kidney disease     kidney stones   Current Outpatient Prescriptions on File Prior to Visit  Medication Sig Dispense Refill  . cetirizine (ZYRTEC) 10 MG tablet Take 10 mg by mouth daily.    Marland Kitchen dicyclomine (BENTYL) 10 MG capsule Take 1 capsule (10 mg total) by mouth 4 (four) times daily -  before meals and at bedtime. 120 capsule 11  . fluticasone (FLONASE) 50 MCG/ACT nasal spray Place 2 sprays into both nostrils daily.    . furosemide (LASIX) 40 MG tablet TAKE 1 TABLET BY MOUTH EVERY MORNING. 30 tablet 3  . guaiFENesin (ROBITUSSIN) 100 MG/5ML SOLN Take 5 mLs (100 mg total) by mouth every 4 (four) hours as needed for cough or to loosen phlegm. 1200 mL 0  . losartan (COZAAR) 100 MG tablet Take 1 tablet (100 mg total) by mouth daily. 90 tablet 3  . naproxen sodium (ALEVE) 220 MG tablet Take 440 mg by mouth every evening.    . norgestimate-ethinyl estradiol (ORTHO-CYCLEN,SPRINTEC,PREVIFEM) 0.25-35 MG-MCG tablet Take 1 tablet by mouth every evening. 1 Package 10  . nystatin cream (MYCOSTATIN) Apply 1 application topically 2 (two) times daily. 30 g  1  . omeprazole (PRILOSEC) 20 MG capsule TAKE 1 CAPSULE BY MOUTH TWICE DAILY 60 capsule 11  . potassium chloride (K-DUR) 10 MEQ tablet TAKE 1 TABLET BY MOUTH 2 TIMES DAILY. 60 tablet 3  . Prenatal Vit-Fe Sulfate-FA (PRENATAL VITAMIN PO) Take 1 tablet by mouth every evening.     . Probiotic Product (PROBIOTIC DAILY PO) Take 500 mg by mouth daily.     . traZODone (DESYREL) 50 MG tablet Take 50 mg by mouth at bedtime.    Marland Kitchen venlafaxine (EFFEXOR) 50 MG tablet Take 50 mg by mouth 2 (two) times daily with a meal.     . amoxicillin (AMOXIL) 500 MG capsule Take 1 capsule (500 mg total) by mouth 3 (three) times daily. (Patient not taking: Reported on 06/16/2014) 21 capsule 0  . azithromycin (ZITHROMAX) 250 MG tablet Take 2 tabs by mouth today then 1 tab by mouth daily for 4 days (Patient not taking: Reported on 06/24/2014) 6 tablet 0  . predniSONE (DELTASONE) 20 MG tablet Take 1 tablet (20 mg total) by mouth daily with breakfast. (Patient not taking: Reported on 06/24/2014) 5 tablet 0  . [DISCONTINUED] potassium chloride (K-DUR,KLOR-CON) 10 MEQ tablet Take 1 tablet (10 mEq total) by mouth 2 (two) times daily. 30 tablet 3   No current facility-administered medications on  file prior to visit.   Family History  Problem Relation Age of Onset  . COPD Mother   . Hypertension Mother   . Cancer Father   . Diabetes Father   . Hypertension Father   . Heart disease Father   . Hyperlipidemia Father   . Asthma Sister   . Hyperlipidemia Brother   . Hypertension Brother   . Vision loss Brother   . Colon cancer Maternal Grandmother   . Esophageal cancer Neg Hx   . Pancreatic cancer Neg Hx   . Rectal cancer Neg Hx   . Stomach cancer Neg Hx    History   Social History  . Marital Status: Single    Spouse Name: N/A    Number of Children: N/A  . Years of Education: N/A   Occupational History  . Not on file.   Social History Main Topics  . Smoking status: Former Smoker -- 1.00 packs/day for 20 years     Quit date: 02/22/2013  . Smokeless tobacco: Never Used  . Alcohol Use: Yes     Comment: social  . Drug Use: No  . Sexual Activity: Not on file   Other Topics Concern  . Not on file   Social History Narrative    Review of Systems: See HPI   Objective:   Filed Vitals:   06/24/14 1705  BP: 127/89  Pulse: 100  Temp: 98.3 F (36.8 C)  Resp: 18    Physical Exam  Constitutional: She is oriented to person, place, and time.  Cardiovascular: Normal rate, regular rhythm and normal heart sounds.   Pulmonary/Chest: Effort normal and breath sounds normal. She has no wheezes.  Abdominal: Soft. Bowel sounds are normal.  Lymphadenopathy:    She has no cervical adenopathy.  Neurological: She is alert and oriented to person, place, and time.  Skin: Skin is warm.  Psychiatric: She has a normal mood and affect.     Lab Results  Component Value Date   WBC 11.1* 10/01/2013   HGB 14.1 10/01/2013   HCT 42.4 10/01/2013   MCV 84.8 10/01/2013   PLT 308 10/01/2013   Lab Results  Component Value Date   CREATININE 0.79 01/20/2014   BUN 11 01/20/2014   NA 137 01/20/2014   K 4.2 01/20/2014   CL 105 01/20/2014   CO2 25 01/20/2014    No results found for: HGBA1C Lipid Panel  No results found for: CHOL, TRIG, HDL, CHOLHDL, VLDL, LDLCALC     Assessment and plan:   Ivorie was seen today for follow-up.  Diagnoses and associated orders for this visit:  Asthma with acute exacerbation, mild persistent - beclomethasone (QVAR) 40 MCG/ACT inhaler; Inhale 2 puffs into the lungs 2 (two) times daily. - albuterol (PROVENTIL HFA;VENTOLIN HFA) 108 (90 BASE) MCG/ACT inhaler; Inhale 2 puffs into the lungs every 6 (six) hours as needed for wheezing or shortness of breath.  Nausea without vomiting - ondansetron (ZOFRAN ODT) 4 MG disintegrating tablet; Take 1 tablet (4 mg total) by mouth every 8 (eight) hours as needed for nausea or vomiting.   Return in about 1 week (around 07/01/2014) for  Nurse Visit-uri f/u.       Chari Manning, NP-C [facility] [phone] 06/24/2014, 5:23 PM

## 2014-07-04 ENCOUNTER — Ambulatory Visit: Payer: No Typology Code available for payment source | Attending: Internal Medicine | Admitting: *Deleted

## 2014-07-04 VITALS — BP 136/83 | HR 97 | Temp 98.8°F | Resp 16

## 2014-07-04 DIAGNOSIS — J4531 Mild persistent asthma with (acute) exacerbation: Secondary | ICD-10-CM

## 2014-07-04 DIAGNOSIS — Z87891 Personal history of nicotine dependence: Secondary | ICD-10-CM | POA: Insufficient documentation

## 2014-07-04 DIAGNOSIS — Z09 Encounter for follow-up examination after completed treatment for conditions other than malignant neoplasm: Secondary | ICD-10-CM | POA: Insufficient documentation

## 2014-07-04 DIAGNOSIS — J189 Pneumonia, unspecified organism: Secondary | ICD-10-CM | POA: Insufficient documentation

## 2014-07-04 NOTE — Progress Notes (Signed)
Patient presents as f/u on pneumonia/asthma exacerbation Med list reviewed; states taking all meds as directed Patient states she feels "much better" Patient states she is a former smoker (quit 1 year ago)  BP 136/83 P 97 T  98.8 oral R 16 SPO2 96%  Lungs: clear to auscultation with good breath sounds noted  Patient given demonstration on correct usage of MDI with correct return demonstration. Patient advised to swish and spit with water following use of qvar  Patient aware that she is to f/u with PCP 3 months from last visit

## 2014-07-14 ENCOUNTER — Encounter: Payer: Self-pay | Admitting: Internal Medicine

## 2014-07-22 ENCOUNTER — Ambulatory Visit: Payer: No Typology Code available for payment source | Attending: Internal Medicine

## 2014-07-28 ENCOUNTER — Ambulatory Visit: Payer: No Typology Code available for payment source | Attending: Internal Medicine

## 2014-07-28 ENCOUNTER — Telehealth: Payer: Self-pay | Admitting: Internal Medicine

## 2014-07-28 NOTE — Telephone Encounter (Signed)
Patient came into facility to speak to PCP about paperwork, patient states that she needs forms filled out for school. Patient states that she needs these forms within the next couple of days. Please f/u with pt.

## 2014-07-30 ENCOUNTER — Ambulatory Visit: Payer: No Typology Code available for payment source | Admitting: Internal Medicine

## 2014-08-06 ENCOUNTER — Other Ambulatory Visit: Payer: Self-pay | Admitting: Internal Medicine

## 2014-08-06 DIAGNOSIS — J4531 Mild persistent asthma with (acute) exacerbation: Secondary | ICD-10-CM

## 2014-08-06 MED ORDER — BECLOMETHASONE DIPROPIONATE 40 MCG/ACT IN AERS: 2.0000 | INHALATION_SPRAY | Freq: Two times a day (BID) | RESPIRATORY_TRACT | Status: DC

## 2014-08-06 MED ORDER — ALBUTEROL SULFATE HFA 108 (90 BASE) MCG/ACT IN AERS: 2.0000 | INHALATION_SPRAY | Freq: Four times a day (QID) | RESPIRATORY_TRACT | Status: DC | PRN

## 2014-09-04 ENCOUNTER — Other Ambulatory Visit: Payer: Self-pay | Admitting: Internal Medicine

## 2014-09-04 ENCOUNTER — Encounter: Payer: Self-pay | Admitting: Gastroenterology

## 2014-09-04 ENCOUNTER — Telehealth: Payer: Self-pay | Admitting: Gastroenterology

## 2014-09-04 ENCOUNTER — Ambulatory Visit (INDEPENDENT_AMBULATORY_CARE_PROVIDER_SITE_OTHER): Payer: No Typology Code available for payment source | Admitting: Gastroenterology

## 2014-09-04 VITALS — BP 126/88 | HR 88 | Ht 65.35 in | Wt 242.2 lb

## 2014-09-04 DIAGNOSIS — E739 Lactose intolerance, unspecified: Secondary | ICD-10-CM

## 2014-09-04 DIAGNOSIS — K589 Irritable bowel syndrome without diarrhea: Secondary | ICD-10-CM

## 2014-09-04 DIAGNOSIS — R197 Diarrhea, unspecified: Secondary | ICD-10-CM

## 2014-09-04 DIAGNOSIS — K219 Gastro-esophageal reflux disease without esophagitis: Secondary | ICD-10-CM

## 2014-09-04 MED ORDER — OMEPRAZOLE 20 MG PO CPDR: DELAYED_RELEASE_CAPSULE | ORAL | Status: DC

## 2014-09-04 MED ORDER — DICYCLOMINE HCL 10 MG PO CAPS: 20.0000 mg | ORAL_CAPSULE | Freq: Four times a day (QID) | ORAL | Status: DC

## 2014-09-04 NOTE — Progress Notes (Signed)
History of Present Illness: This is a 34 year old female with a history of IBS and lactose intolerance. She has abdominal bloating and diarrhea as her main symptoms. Her symptoms were under better control on a low lactose diet and Bentyl 4 times a day. Over the past several months her diarrhea has worsened. She has 6-8 episodes of loose urgent bowel movements daily associated with abdominal bloating. She has had occasional episodes of incontinence. She denies any recent antibiotic usage, medication changes or diet changes. Her reflux symptoms are well controlled on her current regimen. She complains of a productive cough and states she was recently diagnosed with asthma.  Allergies  Allergen Reactions  . Codeine Nausea Only   Outpatient Prescriptions Prior to Visit  Medication Sig Dispense Refill  . albuterol (PROVENTIL HFA;VENTOLIN HFA) 108 (90 BASE) MCG/ACT inhaler Inhale 2 puffs into the lungs every 6 (six) hours as needed for wheezing or shortness of breath. 3 Inhaler 3  . beclomethasone (QVAR) 40 MCG/ACT inhaler Inhale 2 puffs into the lungs 2 (two) times daily. 3 Inhaler 3  . cetirizine (ZYRTEC) 10 MG tablet Take 10 mg by mouth daily.    Marland Kitchen dicyclomine (BENTYL) 10 MG capsule Take 1 capsule (10 mg total) by mouth 4 (four) times daily -  before meals and at bedtime. 120 capsule 11  . fluticasone (FLONASE) 50 MCG/ACT nasal spray Place 2 sprays into both nostrils daily.    . furosemide (LASIX) 40 MG tablet TAKE 1 TABLET BY MOUTH EVERY MORNING. 30 tablet 3  . losartan (COZAAR) 100 MG tablet Take 1 tablet (100 mg total) by mouth daily. 90 tablet 3  . naproxen sodium (ALEVE) 220 MG tablet Take 440 mg by mouth every evening.    . norgestimate-ethinyl estradiol (ORTHO-CYCLEN,SPRINTEC,PREVIFEM) 0.25-35 MG-MCG tablet Take 1 tablet by mouth every evening. 1 Package 10  . nystatin cream (MYCOSTATIN) Apply 1 application topically 2 (two) times daily. 30 g 1  . omeprazole (PRILOSEC) 20 MG capsule TAKE  1 CAPSULE BY MOUTH TWICE DAILY 60 capsule 11  . potassium chloride (K-DUR) 10 MEQ tablet TAKE 1 TABLET BY MOUTH 2 TIMES DAILY. 60 tablet 3  . Prenatal Vit-Fe Sulfate-FA (PRENATAL VITAMIN PO) Take 1 tablet by mouth every evening.     . Probiotic Product (PROBIOTIC DAILY PO) Take 500 mg by mouth daily.     Marland Kitchen venlafaxine (EFFEXOR) 50 MG tablet Take 50 mg by mouth 2 (two) times daily with a meal.     . amoxicillin (AMOXIL) 500 MG capsule Take 1 capsule (500 mg total) by mouth 3 (three) times daily. (Patient not taking: Reported on 06/16/2014) 21 capsule 0  . azithromycin (ZITHROMAX) 250 MG tablet Take 2 tabs by mouth today then 1 tab by mouth daily for 4 days (Patient not taking: Reported on 06/24/2014) 6 tablet 0  . guaiFENesin (ROBITUSSIN) 100 MG/5ML SOLN Take 5 mLs (100 mg total) by mouth every 4 (four) hours as needed for cough or to loosen phlegm. (Patient not taking: Reported on 07/04/2014) 1200 mL 0  . ondansetron (ZOFRAN ODT) 4 MG disintegrating tablet Take 1 tablet (4 mg total) by mouth every 8 (eight) hours as needed for nausea or vomiting. 20 tablet 0  . predniSONE (DELTASONE) 20 MG tablet Take 1 tablet (20 mg total) by mouth daily with breakfast. (Patient not taking: Reported on 06/24/2014) 5 tablet 0  . traZODone (DESYREL) 50 MG tablet Take 50 mg by mouth at bedtime.     No facility-administered medications prior  to visit.   Past Medical History  Diagnosis Date  . Hypertension   . GERD (gastroesophageal reflux disease)   . IBS (irritable bowel syndrome)   . Migraine headache   . S/P appy 05/2013  . Anxiety   . Allergy   . Depression   . Heart murmur   . Chronic kidney disease     kidney stones  . Asthma   . Aortic regurgitation due to bicuspid aortic valve    Past Surgical History  Procedure Laterality Date  . Dental surgery    . Laparoscopic appendectomy N/A 06/06/2013    Procedure: APPENDECTOMY LAPAROSCOPIC;  Surgeon: Imogene Burn. Georgette Dover, MD;  Location: [facility];  Service:  General;  Laterality: N/A;  . Appendectomy     History   Social History  . Marital Status: Single    Spouse Name: N/A  . Number of Children: N/A  . Years of Education: N/A   Social History Main Topics  . Smoking status: Former Smoker -- 1.00 packs/day for 20 years    Quit date: 02/22/2013  . Smokeless tobacco: Never Used  . Alcohol Use: Yes     Comment: social  . Drug Use: No  . Sexual Activity: Not on file   Other Topics Concern  . None   Social History Narrative   Family History  Problem Relation Age of Onset  . COPD Mother   . Hypertension Mother   . Cancer Father   . Diabetes Father   . Hypertension Father   . Heart disease Father   . Hyperlipidemia Father   . Asthma Sister   . Hyperlipidemia Brother   . Hypertension Brother   . Vision loss Brother   . Colon cancer Maternal Grandmother   . Esophageal cancer Neg Hx   . Pancreatic cancer Neg Hx   . Rectal cancer Neg Hx   . Stomach cancer Neg Hx       Current Medications, Allergies, Past Medical History, Past Surgical History, Family History and Social History were reviewed in [employer] record.  Physical Exam: General: Well developed , well nourished, no acute distress Head: Normocephalic and atraumatic Eyes:  sclerae anicteric, EOMI Ears: Normal auditory acuity Mouth: No deformity or lesions Lungs: Clear throughout to auscultation Heart: Regular rate and rhythm; no murmurs, rubs or bruits Abdomen: Soft, non tender and non distended. No masses, hepatosplenomegaly or hernias noted. Normal Bowel sounds Musculoskeletal: Symmetrical with no gross deformities  Pulses:  Normal pulses noted Extremities: No clubbing, cyanosis, edema or deformities noted Neurological: Alert oriented x 4, grossly nonfocal Psychological:  Alert and cooperative. Normal mood and affect  Assessment and Recommendations:   1. IBS-D. Lactose intolerance. She is advised to begin a complete lactose avoidance  diet for 7-10 days. If her diarrhea is not adequately controlled she will begin a FODMAP diet one month and if this is beneficial she will remain on this diet. Avoid any foods that trigger symptoms. Increase dicyclomine to 20 mg before meals and at bedtime. Imodium twice a day when necessary if above measures are not effective in controlling her diarrhea. She is advised to contact our office if her symptoms are not under good control with the above measures. Consider stool studies and a trial of Xifaxan if above measures are not successful.  2. GERD. Continue standard antireflux measures and omeprazole 20 mg twice daily.

## 2014-09-04 NOTE — Patient Instructions (Signed)
We have sent medications to your pharmacy for you to pick up at your convenience. Try a lactose free diet for 10 days, if that does not work try the FODMAP diet for 10 days, if that does not work use imodium twice daily for your IBS. We have given you a FODMAP and LACTOSE FREE diet. CC:  Chari Manning MD

## 2014-09-05 NOTE — Telephone Encounter (Signed)
Prescription was already sent at office visit on 09/04/14.

## 2014-09-10 ENCOUNTER — Other Ambulatory Visit: Payer: Self-pay | Admitting: Internal Medicine

## 2014-09-10 ENCOUNTER — Telehealth: Payer: Self-pay | Admitting: Internal Medicine

## 2014-09-10 DIAGNOSIS — E876 Hypokalemia: Secondary | ICD-10-CM

## 2014-09-10 NOTE — Telephone Encounter (Signed)
May refill for one month and have her to schedule a lab visit within the next month

## 2014-09-10 NOTE — Telephone Encounter (Signed)
Patient called to request a med refill for Potassium, patient would like to speak to nurse. Please f/u

## 2014-09-11 MED ORDER — POTASSIUM CHLORIDE ER 10 MEQ PO TBCR: EXTENDED_RELEASE_TABLET | ORAL | Status: DC

## 2014-09-11 NOTE — Addendum Note (Signed)
Addended by: Betti Cruz on: 09/11/2014 11:45 AM   Modules accepted: Orders

## 2014-09-11 NOTE — Telephone Encounter (Signed)
Left voice message, please call our office to schedule appointment  Rx at [facility]

## 2014-09-12 ENCOUNTER — Ambulatory Visit: Payer: No Typology Code available for payment source | Attending: Internal Medicine | Admitting: Internal Medicine

## 2014-09-12 ENCOUNTER — Encounter: Payer: Self-pay | Admitting: Internal Medicine

## 2014-09-12 VITALS — BP 132/87 | HR 118 | Temp 99.4°F | Resp 14 | Ht 66.0 in | Wt 238.0 lb

## 2014-09-12 DIAGNOSIS — L304 Erythema intertrigo: Secondary | ICD-10-CM

## 2014-09-12 LAB — BASIC METABOLIC PANEL
BUN: 11 mg/dL (ref 6–23)
CHLORIDE: 104 meq/L (ref 96–112)
CO2: 27 meq/L (ref 19–32)
Calcium: 9.4 mg/dL (ref 8.4–10.5)
Creat: 0.79 mg/dL (ref 0.50–1.10)
Glucose, Bld: 92 mg/dL (ref 70–99)
Potassium: 3.9 mEq/L (ref 3.5–5.3)
Sodium: 142 mEq/L (ref 135–145)

## 2014-09-12 MED ORDER — KETOCONAZOLE 2 % EX CREA: 1.0000 "application " | TOPICAL_CREAM | Freq: Two times a day (BID) | CUTANEOUS | Status: DC | PRN

## 2014-09-12 NOTE — Progress Notes (Signed)
Pt is here to c/o a yeast infection under her breast and that the cream that she is using is not working. Pt is requesting a medication refill. Pt has been compliant with her medication regimen.

## 2014-09-12 NOTE — Progress Notes (Signed)
Subjective:     Patient ID: Stephanie Miles, female   DOB: [birth year], 34 y.o.   MRN: [id number]  Rash This is a recurrent problem. The current episode started 1 to 4 weeks ago. The problem is unchanged. Pain location: left/right breast. The rash is characterized by redness, itchiness and scaling. She was exposed to nothing. Pertinent negatives include no joint pain. Treatments tried: nystatin cream. The treatment provided no relief. Her past medical history is significant for allergies and asthma. There is no history of eczema.     Review of Systems  Unable to perform ROS Musculoskeletal: Negative for joint pain.  Skin: Positive for rash.       Objective:   Physical Exam  Cardiovascular: Normal rate, regular rhythm and normal heart sounds.   Pulmonary/Chest: Effort normal and breath sounds normal.  Neurological: She is alert.  Skin: Skin is warm. Rash (intertrigo under bilateral breast) noted.  Psychiatric: She has a normal mood and affect.       Assessment:     Intertrigo     Plan:     Discontinue Nystatin and begin Ketoconazole   Return in about 6 months (around 03/13/2015).     Chari Manning, NP 09/16/2014 10:04 PM

## 2014-09-12 NOTE — Patient Instructions (Signed)
Intertrigo Intertrigo is a skin condition that occurs in between folds of skin in places on the body that rub together a lot and do not get much ventilation. It is caused by heat, moisture, friction, sweat retention, and lack of air circulation, which produces red, irritated patches and, sometimes, scaling or drainage. People who have diabetes, who are obese, or who have treatment with antibiotics are at increased risk for intertrigo. The most common sites for intertrigo to occur include:  The groin.  The breasts.  The armpits.  Folds of abdominal skin.  Webbed spaces between the fingers or toes. Intertrigo may be aggravated by:  Sweat.  Feces.  Yeast or bacteria that are present near skin folds.  Urine.  Vaginal discharge. HOME CARE INSTRUCTIONS  The following steps can be taken to reduce friction and keep the affected area cool and dry:  Expose skin folds to the air.  Keep deep skin folds separated with cotton or linen cloth. Avoid tight fitting clothing that could cause chafing.  Wear open-toed shoes or sandals to help reduce moisture between the toes.  Apply absorbent powders to affected areas as directed by your caregiver.  Apply over-the-counter barrier pastes, such as zinc oxide, as directed by your caregiver.  If you develop a fungal infection in the affected area, your caregiver may have you use antifungal creams. SEEK MEDICAL CARE IF:   The rash is not improving after 1 week of treatment.  The rash is getting worse (more red, more swollen, more painful, or spreading).  You have a fever or chills. MAKE SURE YOU:   Understand these instructions.  Will watch your condition.  Will get help right away if you are not doing well or get worse. Document Released: 07/11/2005 Document Revised: 10/03/2011 Document Reviewed: 12/24/2009 [facility] Patient Information 2015 [place], [place]. This information is not intended to replace advice given to you by your health  care provider. Make sure you discuss any questions you have with your health care provider.

## 2014-09-15 ENCOUNTER — Telehealth: Payer: Self-pay | Admitting: *Deleted

## 2014-09-15 NOTE — Telephone Encounter (Signed)
-----   Message from Lance Bosch, NP sent at 09/15/2014  9:38 AM EST ----- Labs are normal. Potassium slightly lower than before but still in normal range

## 2014-09-15 NOTE — Telephone Encounter (Signed)
Left message to return my call.  

## 2014-09-17 ENCOUNTER — Other Ambulatory Visit: Payer: Self-pay | Admitting: Internal Medicine

## 2014-09-18 ENCOUNTER — Telehealth: Payer: Self-pay | Admitting: Emergency Medicine

## 2014-09-18 NOTE — Telephone Encounter (Signed)
Pt given normal blood work results

## 2014-09-18 NOTE — Telephone Encounter (Signed)
-----   Message from Lance Bosch, NP sent at 09/15/2014  9:38 AM EST ----- Labs are normal. Potassium slightly lower than before but still in normal range

## 2014-10-10 ENCOUNTER — Other Ambulatory Visit: Payer: Self-pay

## 2014-10-10 MED ORDER — DICYCLOMINE HCL 20 MG PO TABS: 20.0000 mg | ORAL_TABLET | Freq: Three times a day (TID) | ORAL | Status: DC

## 2014-10-16 NOTE — Progress Notes (Signed)
Patient ID: Stephanie Miles, female   DOB: 08-26-1980, 34 y.o.   MRN: [id number] I agree with RN's assessment and intervention per my guidance.

## 2014-10-22 ENCOUNTER — Other Ambulatory Visit: Payer: Self-pay | Admitting: Internal Medicine

## 2014-10-22 DIAGNOSIS — J4531 Mild persistent asthma with (acute) exacerbation: Secondary | ICD-10-CM

## 2014-10-22 MED ORDER — ALBUTEROL SULFATE HFA 108 (90 BASE) MCG/ACT IN AERS: 2.0000 | INHALATION_SPRAY | Freq: Four times a day (QID) | RESPIRATORY_TRACT | Status: DC | PRN

## 2014-10-22 MED ORDER — BECLOMETHASONE DIPROPIONATE 40 MCG/ACT IN AERS: 2.0000 | INHALATION_SPRAY | Freq: Two times a day (BID) | RESPIRATORY_TRACT | Status: DC

## 2014-12-09 ENCOUNTER — Other Ambulatory Visit: Payer: Self-pay | Admitting: Internal Medicine

## 2014-12-10 ENCOUNTER — Telehealth: Payer: Self-pay | Admitting: General Practice

## 2014-12-10 NOTE — Telephone Encounter (Signed)
Patient presents to clinic to request a medication refill for norgestimate-ethinyl estradiol (ORTHO-CYCLEN,SPRINTEC,PREVIFEM) 0.25-35 MG-MCG tablet  Patient states that without this medication she finds herself in a great deal of pain and in the ED.  Please assist

## 2014-12-12 ENCOUNTER — Telehealth: Payer: Self-pay

## 2014-12-12 ENCOUNTER — Telehealth: Payer: Self-pay | Admitting: Internal Medicine

## 2014-12-12 ENCOUNTER — Other Ambulatory Visit: Payer: Self-pay | Admitting: *Deleted

## 2014-12-12 DIAGNOSIS — Z3009 Encounter for other general counseling and advice on contraception: Secondary | ICD-10-CM

## 2014-12-12 MED ORDER — NORGESTIMATE-ETH ESTRADIOL 0.25-35 MG-MCG PO TABS: 1.0000 | ORAL_TABLET | Freq: Every evening | ORAL | Status: DC

## 2014-12-12 NOTE — Telephone Encounter (Signed)
Patient called to request a med refill for her birth control,  Please f/u

## 2014-12-12 NOTE — Telephone Encounter (Signed)
Returned patient call today and patient stated she had already spoken To someone earlier in the day

## 2014-12-15 ENCOUNTER — Other Ambulatory Visit: Payer: Self-pay | Admitting: *Deleted

## 2014-12-15 DIAGNOSIS — R609 Edema, unspecified: Secondary | ICD-10-CM

## 2014-12-15 MED ORDER — FUROSEMIDE 40 MG PO TABS
40.0000 mg | ORAL_TABLET | Freq: Every morning | ORAL | Status: DC
Start: 2014-12-15 — End: 2015-04-03

## 2014-12-17 ENCOUNTER — Other Ambulatory Visit: Payer: Self-pay | Admitting: Gastroenterology

## 2014-12-24 ENCOUNTER — Encounter: Payer: Self-pay | Admitting: Internal Medicine

## 2014-12-24 ENCOUNTER — Ambulatory Visit: Payer: No Typology Code available for payment source | Attending: Internal Medicine | Admitting: Internal Medicine

## 2014-12-24 VITALS — BP 146/92 | HR 110 | Temp 98.1°F | Resp 18 | Ht 66.0 in | Wt 239.0 lb

## 2014-12-24 DIAGNOSIS — R519 Headache, unspecified: Secondary | ICD-10-CM

## 2014-12-24 DIAGNOSIS — Z3041 Encounter for surveillance of contraceptive pills: Secondary | ICD-10-CM | POA: Insufficient documentation

## 2014-12-24 DIAGNOSIS — E876 Hypokalemia: Secondary | ICD-10-CM | POA: Insufficient documentation

## 2014-12-24 DIAGNOSIS — M545 Low back pain, unspecified: Secondary | ICD-10-CM

## 2014-12-24 DIAGNOSIS — R51 Headache: Secondary | ICD-10-CM

## 2014-12-24 DIAGNOSIS — I10 Essential (primary) hypertension: Secondary | ICD-10-CM | POA: Insufficient documentation

## 2014-12-24 LAB — BASIC METABOLIC PANEL
BUN: 12 mg/dL (ref 6–23)
CO2: 27 mEq/L (ref 19–32)
CREATININE: 1.05 mg/dL (ref 0.50–1.10)
Calcium: 9.4 mg/dL (ref 8.4–10.5)
Chloride: 102 mEq/L (ref 96–112)
Glucose, Bld: 129 mg/dL — ABNORMAL HIGH (ref 70–99)
POTASSIUM: 4.9 meq/L (ref 3.5–5.3)
Sodium: 140 mEq/L (ref 135–145)

## 2014-12-24 MED ORDER — POTASSIUM CHLORIDE ER 10 MEQ PO TBCR: EXTENDED_RELEASE_TABLET | ORAL | Status: DC

## 2014-12-24 MED ORDER — LOSARTAN POTASSIUM 100 MG PO TABS: 100.0000 mg | ORAL_TABLET | Freq: Every day | ORAL | Status: DC

## 2014-12-24 MED ORDER — NORGESTIMATE-ETH ESTRADIOL 0.25-35 MG-MCG PO TABS: 1.0000 | ORAL_TABLET | Freq: Every evening | ORAL | Status: DC

## 2014-12-24 NOTE — Patient Instructions (Signed)
Back Pain, Adult Low back pain is very common. About 1 in 5 people have back pain.The cause of low back pain is rarely dangerous. The pain often gets better over time.About half of people with a sudden onset of back pain feel better in just 2 weeks. About 8 in 10 people feel better by 6 weeks.  CAUSES Some common causes of back pain include:  Strain of the muscles or ligaments supporting the spine.  Wear and tear (degeneration) of the spinal discs.  Arthritis.  Direct injury to the back. DIAGNOSIS Most of the time, the direct cause of low back pain is not known.However, back pain can be treated effectively even when the exact cause of the pain is unknown.Answering your caregiver's questions about your overall health and symptoms is one of the most accurate ways to make sure the cause of your pain is not dangerous. If your caregiver needs more information, he or she may order lab work or imaging tests (X-rays or MRIs).However, even if imaging tests show changes in your back, this usually does not require surgery. HOME CARE INSTRUCTIONS For many people, back pain returns.Since low back pain is rarely dangerous, it is often a condition that people can learn to manageon their own.   Remain active. It is stressful on the back to sit or stand in one place. Do not sit, drive, or stand in one place for more than 30 minutes at a time. Take short walks on level surfaces as soon as pain allows.Try to increase the length of time you walk each day.  Do not stay in bed.Resting more than 1 or 2 days can delay your recovery.  Do not avoid exercise or work.Your body is made to move.It is not dangerous to be active, even though your back may hurt.Your back will likely heal faster if you return to being active before your pain is gone.  Pay attention to your body when you bend and lift. Many people have less discomfortwhen lifting if they bend their knees, keep the load close to their bodies,and  avoid twisting. Often, the most comfortable positions are those that put less stress on your recovering back.  Find a comfortable position to sleep. Use a firm mattress and lie on your side with your knees slightly bent. If you lie on your back, put a pillow under your knees.  Only take over-the-counter or prescription medicines as directed by your caregiver. Over-the-counter medicines to reduce pain and inflammation are often the most helpful.Your caregiver may prescribe muscle relaxant drugs.These medicines help dull your pain so you can more quickly return to your normal activities and healthy exercise.  Put ice on the injured area.  Put ice in a plastic bag.  Place a towel between your skin and the bag.  Leave the ice on for 15-20 minutes, 03-04 times a day for the first 2 to 3 days. After that, ice and heat may be alternated to reduce pain and spasms.  Ask your caregiver about trying back exercises and gentle massage. This may be of some benefit.  Avoid feeling anxious or stressed.Stress increases muscle tension and can worsen back pain.It is important to recognize when you are anxious or stressed and learn ways to manage it.Exercise is a great option. SEEK MEDICAL CARE IF:  You have pain that is not relieved with rest or medicine.  You have pain that does not improve in 1 week.  You have new symptoms.  You are generally not feeling well. SEEK   IMMEDIATE MEDICAL CARE IF:   You have pain that radiates from your back into your legs.  You develop new bowel or bladder control problems.  You have unusual weakness or numbness in your arms or legs.  You develop nausea or vomiting.  You develop abdominal pain.  You feel faint. Document Released: 07/11/2005 Document Revised: 01/10/2012 Document Reviewed: 11/12/2013 [employer] Patient Information 2015 [employer]. This information is not intended to replace advice given to you by your health care provider. Make sure you  discuss any questions you have with your health care provider.  

## 2014-12-24 NOTE — Progress Notes (Signed)
Patient ID: Stephanie Miles, female   DOB: 26-Jul-1980, 34 y.o.   MRN: [id number]  CC: headache, back pain   HPI: Stephanie Miles is a 34 y.o. female here today for a follow up visit.  Patient has past medical history of HTN, GERD, IBS, GERD, depression. Patient reports that she needs refills. She takes her blood pressure medication daily without any skipped doses and that it is time for her to follow up with Dr. Verl Blalock for her annual exam.  Today she complains of a headache for the past couple of weeks that she believes is not consistent with migraines. She has tried zyrtec D because she thought it was allergy related. Headache begin in her face and behind the eyes. She denies light and sound sensitivity.  Patient also reports lower back pain for past couple of weeks rated as a 7/10. The back pain is not a new problem. Pain described as a crushing sensation. She has been taking aleve for pain and she exercises daily-usually low impact. Aleve helps her pain significantly.   Patient has No chest pain, No abdominal pain - No Nausea, No new weakness tingling or numbness, No Cough - SOB.  Allergies  Allergen Reactions  . Codeine Nausea Only   Past Medical History  Diagnosis Date  . Hypertension   . GERD (gastroesophageal reflux disease)   . IBS (irritable bowel syndrome)   . Migraine headache   . S/P appy 05/2013  . Anxiety   . Allergy   . Depression   . Heart murmur   . Chronic kidney disease     kidney stones  . Asthma   . Aortic regurgitation due to bicuspid aortic valve    Current Outpatient Prescriptions on File Prior to Visit  Medication Sig Dispense Refill  . albuterol (PROVENTIL HFA;VENTOLIN HFA) 108 (90 BASE) MCG/ACT inhaler Inhale 2 puffs into the lungs every 6 (six) hours as needed for wheezing or shortness of breath. 3 Inhaler 3  . beclomethasone (QVAR) 40 MCG/ACT inhaler Inhale 2 puffs into the lungs 2 (two) times daily. 3 Inhaler 3  . cetirizine (ZYRTEC) 10 MG tablet Take 10  mg by mouth daily.    Marland Kitchen dicyclomine (BENTYL) 20 MG tablet Take 1 tablet (20 mg total) by mouth 4 (four) times daily -  before meals and at bedtime. 120 tablet 11  . fluticasone (FLONASE) 50 MCG/ACT nasal spray Place 2 sprays into both nostrils daily.    . furosemide (LASIX) 40 MG tablet Take 1 tablet (40 mg total) by mouth every morning. 30 tablet 3  . ketoconazole (NIZORAL) 2 % cream Apply 1 application topically 2 (two) times daily as needed for irritation. 30 g 2  . losartan (COZAAR) 100 MG tablet Take 1 tablet (100 mg total) by mouth daily. 90 tablet 3  . naproxen sodium (ALEVE) 220 MG tablet Take 440 mg by mouth every evening.    . norgestimate-ethinyl estradiol (ORTHO-CYCLEN,SPRINTEC,PREVIFEM) 0.25-35 MG-MCG tablet Take 1 tablet by mouth every evening. 1 Package 3  . omeprazole (PRILOSEC) 20 MG capsule TAKE 1 CAPSULE BY MOUTH TWICE DAILY 60 capsule 11  . potassium chloride (K-DUR) 10 MEQ tablet TAKE 1 TABLET BY MOUTH 2 TIMES DAILY. 60 tablet 3  . Prenatal Vit-Fe Sulfate-FA (PRENATAL VITAMIN PO) Take 1 tablet by mouth every evening.     . Probiotic Product (PROBIOTIC DAILY PO) Take 500 mg by mouth daily.     . traZODone (DESYREL) 100 MG tablet Take 100 mg by mouth at bedtime.    Marland Kitchen  venlafaxine (EFFEXOR) 50 MG tablet Take 50 mg by mouth 2 (two) times daily with a meal.     . nystatin cream (MYCOSTATIN) Apply 1 application topically 2 (two) times daily. (Patient not taking: Reported on 12/24/2014) 30 g 1  . [DISCONTINUED] potassium chloride (K-DUR,KLOR-CON) 10 MEQ tablet Take 1 tablet (10 mEq total) by mouth 2 (two) times daily. 30 tablet 3   No current facility-administered medications on file prior to visit.   Family History  Problem Relation Age of Onset  . COPD Mother   . Hypertension Mother   . Cancer Father   . Diabetes Father   . Hypertension Father   . Heart disease Father   . Hyperlipidemia Father   . Asthma Sister   . Hyperlipidemia Brother   . Hypertension Brother   . Vision  loss Brother   . Colon cancer Maternal Grandmother   . Esophageal cancer Neg Hx   . Pancreatic cancer Neg Hx   . Rectal cancer Neg Hx   . Stomach cancer Neg Hx    History   Social History  . Marital Status: Single    Spouse Name: N/A  . Number of Children: N/A  . Years of Education: N/A   Occupational History  . Not on file.   Social History Main Topics  . Smoking status: Former Smoker -- 1.00 packs/day for 20 years    Quit date: 02/22/2013  . Smokeless tobacco: Never Used  . Alcohol Use: Yes     Comment: social  . Drug Use: No  . Sexual Activity: Not on file   Other Topics Concern  . Not on file   Social History Narrative    Review of Systems  Musculoskeletal: Positive for back pain.  Neurological: Positive for headaches.  All other systems reviewed and are negative.     Objective:   Filed Vitals:   12/24/14 1426  BP: 146/92  Pulse:   Temp:   Resp:     Physical Exam  Constitutional: She is oriented to person, place, and time.  Cardiovascular: Normal rate, regular rhythm and normal heart sounds.   Pulmonary/Chest: Effort normal and breath sounds normal.  Musculoskeletal: She exhibits tenderness (lumbar spine ).  Neurological: She is alert and oriented to person, place, and time. No cranial nerve deficit.  Skin: Skin is warm and dry.    Lab Results  Component Value Date   WBC 11.1* 10/01/2013   HGB 14.1 10/01/2013   HCT 42.4 10/01/2013   MCV 84.8 10/01/2013   PLT 308 10/01/2013   Lab Results  Component Value Date   CREATININE 0.79 09/12/2014   BUN 11 09/12/2014   NA 142 09/12/2014   K 3.9 09/12/2014   CL 104 09/12/2014   CO2 27 09/12/2014    No results found for: HGBA1C Lipid Panel  No results found for: CHOL, TRIG, HDL, CHOLHDL, VLDL, LDLCALC     Assessment and plan:   Stephanie Miles was seen today for follow-up.  Diagnoses and all orders for this visit:  Essential hypertension Orders: -   Refill losartan (COZAAR) 100 MG tablet;  Take 1 tablet (100 mg total) by mouth daily. Patients blood pressure is elevated today but has been normal on other office visit. Will not make changes today. Elevation may be due to chronic aleve use and recent decongestants.   Hypokalemia Orders: -     Basic Metabolic Panel -     Refill potassium chloride (K-DUR) 10 MEQ tablet; TAKE 1 TABLET BY MOUTH  2 TIMES DAILY.  Acute nonintractable headache, unspecified headache type Likely due to BP elevations  Midline low back pain without sciatica Orders: -     Ambulatory referral to Orthopedic Surgery Has been a ongoing problem for several months. Will refer out, conservative measures are not successful.   Encounter for birth control pills maintenance Orders: -    Refill norgestimate-ethinyl estradiol (ORTHO-CYCLEN,SPRINTEC,PREVIFEM) 0.25-35 MG-MCG tablet; Take 1 tablet by mouth every evening.  Return in about 6 months (around 06/25/2015) for Hypertension.       Chari Manning, NP-C [facility] [phone] 12/24/2014, 2:38 PM

## 2014-12-24 NOTE — Progress Notes (Signed)
Patient here for Follow up. Patient needs refills on potassium, cozaar, and birth control. Patient needs annual visit with Dr. Verl Blalock, cardiology. Patient needs titers for college, starting in Fall. Patient reports headache for a couple of weeks, suspected allergy/sinus, has been taking zyrtec D for a week, helped but still has headache comparable to migraine without light/sound sensitivity. Headache at level 7 currently. Lower back pain, at level 7, described as crushing. Patient does yoga and swims. Patient takes Aleve to get out of bed in mornings. Patient reports falling several times in the past year due to left knee giving out.

## 2015-01-02 ENCOUNTER — Other Ambulatory Visit: Payer: Self-pay | Admitting: Internal Medicine

## 2015-01-02 DIAGNOSIS — Z79899 Other long term (current) drug therapy: Secondary | ICD-10-CM

## 2015-01-05 ENCOUNTER — Ambulatory Visit: Payer: No Typology Code available for payment source | Attending: Internal Medicine

## 2015-01-05 ENCOUNTER — Ambulatory Visit: Payer: No Typology Code available for payment source

## 2015-01-15 ENCOUNTER — Telehealth: Payer: Self-pay

## 2015-01-15 NOTE — Telephone Encounter (Signed)
-----   Message from Lance Bosch, NP sent at 01/02/2015 10:32 AM EDT ----- Call patient and let her know that her potassium is actually on the high end of normal. She should be ok to take 1 potassium tablet 3 times per week. With the losartan it should keep her potassium level normal. She can start this way and I will recheck her in 2 months.

## 2015-01-15 NOTE — Telephone Encounter (Signed)
Patient called returning nurse's phone call to review results. Please f/u

## 2015-01-15 NOTE — Telephone Encounter (Signed)
Nurse called patient, reached voicemail. Left message for patient to call Kirra Verga at [facility], [phone].

## 2015-01-15 NOTE — Telephone Encounter (Signed)
Patient called nurse, patient verified date of birth. Patient aware of potassium on the high end of normal. Patient agrees to take 1 potassium tablet 3 times per week. Patient aware of these instructions and the losartan, potassium level should remain normal.  Patient agrees to call back in 1 month to schedule 2 month appointment.

## 2015-01-28 ENCOUNTER — Telehealth: Payer: Self-pay | Admitting: Internal Medicine

## 2015-01-28 NOTE — Telephone Encounter (Signed)
Patient called wanting to schedule an appointment with PCP, pt. Is experiencing shortness of breath, headaches, chills.Marland Kitchen...pt. Was offered an appointment for Thursday 01/30/15, pt. Declined and she stated she would go to the ED

## 2015-01-29 ENCOUNTER — Ambulatory Visit: Payer: No Typology Code available for payment source | Attending: Internal Medicine | Admitting: Internal Medicine

## 2015-01-29 ENCOUNTER — Encounter: Payer: Self-pay | Admitting: Internal Medicine

## 2015-01-29 VITALS — BP 136/99 | HR 96 | Temp 98.3°F | Ht 66.0 in | Wt 245.0 lb

## 2015-01-29 DIAGNOSIS — J4531 Mild persistent asthma with (acute) exacerbation: Secondary | ICD-10-CM | POA: Insufficient documentation

## 2015-01-29 DIAGNOSIS — B9789 Other viral agents as the cause of diseases classified elsewhere: Secondary | ICD-10-CM

## 2015-01-29 DIAGNOSIS — J069 Acute upper respiratory infection, unspecified: Secondary | ICD-10-CM

## 2015-01-29 MED ORDER — ALBUTEROL SULFATE (2.5 MG/3ML) 0.083% IN NEBU
2.5000 mg | INHALATION_SOLUTION | Freq: Once | RESPIRATORY_TRACT | Status: AC
  Administered 2015-01-29: 2.5 mg via RESPIRATORY_TRACT

## 2015-01-29 MED ORDER — PREDNISONE 10 MG PO TABS: 10.0000 mg | ORAL_TABLET | Freq: Every day | ORAL | Status: DC

## 2015-01-29 NOTE — Patient Instructions (Addendum)
If no better in 7 days call me back and we will prescribe you something as needed.   Upper Respiratory Infection, Adult An upper respiratory infection (URI) is also sometimes known as the common cold. The upper respiratory tract includes the nose, sinuses, throat, trachea, and bronchi. Bronchi are the airways leading to the lungs. Most people improve within 1 week, but symptoms can last up to 2 weeks. A residual cough may last even longer.  CAUSES Many different viruses can infect the tissues lining the upper respiratory tract. The tissues become irritated and inflamed and often become very moist. Mucus production is also common. A cold is contagious. You can easily spread the virus to others by oral contact. This includes kissing, sharing a glass, coughing, or sneezing. Touching your mouth or nose and then touching a surface, which is then touched by another person, can also spread the virus. SYMPTOMS  Symptoms typically develop 1 to 3 days after you come in contact with a cold virus. Symptoms vary from person to person. They may include:  Runny nose.  Sneezing.  Nasal congestion.  Sinus irritation.  Sore throat.  Loss of voice (laryngitis).  Cough.  Fatigue.  Muscle aches.  Loss of appetite.  Headache.  Low-grade fever. DIAGNOSIS  You might diagnose your own cold based on familiar symptoms, since most people get a cold 2 to 3 times a year. Your caregiver can confirm this based on your exam. Most importantly, your caregiver can check that your symptoms are not due to another disease such as strep throat, sinusitis, pneumonia, asthma, or epiglottitis. Blood tests, throat tests, and X-rays are not necessary to diagnose a common cold, but they may sometimes be helpful in excluding other more serious diseases. Your caregiver will decide if any further tests are required. RISKS AND COMPLICATIONS  You may be at risk for a more severe case of the common cold if you smoke cigarettes, have  chronic heart disease (such as heart failure) or lung disease (such as asthma), or if you have a weakened immune system. The very young and very old are also at risk for more serious infections. Bacterial sinusitis, middle ear infections, and bacterial pneumonia can complicate the common cold. The common cold can worsen asthma and chronic obstructive pulmonary disease (COPD). Sometimes, these complications can require emergency medical care and may be life-threatening. PREVENTION  The best way to protect against getting a cold is to practice good hygiene. Avoid oral or hand contact with people with cold symptoms. Wash your hands often if contact occurs. There is no clear evidence that vitamin C, vitamin E, echinacea, or exercise reduces the chance of developing a cold. However, it is always recommended to get plenty of rest and practice good nutrition. TREATMENT  Treatment is directed at relieving symptoms. There is no cure. Antibiotics are not effective, because the infection is caused by a virus, not by bacteria. Treatment may include:  Increased fluid intake. Sports drinks offer valuable electrolytes, sugars, and fluids.  Breathing heated mist or steam (vaporizer or shower).  Eating chicken soup or other clear broths, and maintaining good nutrition.  Getting plenty of rest.  Using gargles or lozenges for comfort.  Controlling fevers with ibuprofen or acetaminophen as directed by your caregiver.  Increasing usage of your inhaler if you have asthma. Zinc gel and zinc lozenges, taken in the first 24 hours of the common cold, can shorten the duration and lessen the severity of symptoms. Pain medicines may help with fever, muscle  aches, and throat pain. A variety of non-prescription medicines are available to treat congestion and runny nose. Your caregiver can make recommendations and may suggest nasal or lung inhalers for other symptoms.  HOME CARE INSTRUCTIONS   Only take over-the-counter or  prescription medicines for pain, discomfort, or fever as directed by your caregiver.  Use a warm mist humidifier or inhale steam from a shower to increase air moisture. This may keep secretions moist and make it easier to breathe.  Drink enough water and fluids to keep your urine clear or pale yellow.  Rest as needed.  Return to work when your temperature has returned to normal or as your caregiver advises. You may need to stay home longer to avoid infecting others. You can also use a face mask and careful hand washing to prevent spread of the virus. SEEK MEDICAL CARE IF:   After the first few days, you feel you are getting worse rather than better.  You need your caregiver's advice about medicines to control symptoms.  You develop chills, worsening shortness of breath, or brown or red sputum. These may be signs of pneumonia.  You develop yellow or brown nasal discharge or pain in the face, especially when you bend forward. These may be signs of sinusitis.  You develop a fever, swollen neck glands, pain with swallowing, or white areas in the back of your throat. These may be signs of strep throat. SEEK IMMEDIATE MEDICAL CARE IF:   You have a fever.  You develop severe or persistent headache, ear pain, sinus pain, or chest pain.  You develop wheezing, a prolonged cough, cough up blood, or have a change in your usual mucus (if you have chronic lung disease).  You develop sore muscles or a stiff neck. Document Released: 01/04/2001 Document Revised: 10/03/2011 Document Reviewed: 10/16/2013 [facility] Patient Information 2015 [place], [place]. This information is not intended to replace advice given to you by your health care provider. Make sure you discuss any questions you have with your health care provider.

## 2015-01-29 NOTE — Progress Notes (Signed)
Patient reports cold like symptoms that began on Tuesday Sinus pressure, itchy throat, eye discharge, low grade fever, general malaise

## 2015-01-29 NOTE — Progress Notes (Signed)
Patient ID: Stephanie Miles, female   DOB: 08/17/1980, 34 y.o.   MRN: [id number]  CC: cold symptoms   HPI: Stephanie Miles is a 34 y.o. female here today for a follow up visit.  Patient has past medical history of HTN, GERD, IBS, and asthma. Patient reports that she has been having cold like symptoms for 2 days. She has symptoms of sinus pressure, itchy throat, watery eys, low grade fever, and malaise. She has been taking zyrtec, flonase, and OTC cold medication. She also reports that due to her symptoms she has been having increased SOB and wheezing. No nighttime symptoms.    Allergies  Allergen Reactions  . Codeine Nausea Only   Past Medical History  Diagnosis Date  . Hypertension   . GERD (gastroesophageal reflux disease)   . IBS (irritable bowel syndrome)   . Migraine headache   . S/P appy 05/2013  . Anxiety   . Allergy   . Depression   . Heart murmur   . Chronic kidney disease     kidney stones  . Asthma   . Aortic regurgitation due to bicuspid aortic valve    Current Outpatient Prescriptions on File Prior to Visit  Medication Sig Dispense Refill  . albuterol (PROVENTIL HFA;VENTOLIN HFA) 108 (90 BASE) MCG/ACT inhaler Inhale 2 puffs into the lungs every 6 (six) hours as needed for wheezing or shortness of breath. 3 Inhaler 3  . beclomethasone (QVAR) 40 MCG/ACT inhaler Inhale 2 puffs into the lungs 2 (two) times daily. 3 Inhaler 3  . cetirizine (ZYRTEC) 10 MG tablet Take 10 mg by mouth daily.    Marland Kitchen dicyclomine (BENTYL) 20 MG tablet Take 1 tablet (20 mg total) by mouth 4 (four) times daily -  before meals and at bedtime. 120 tablet 11  . fluticasone (FLONASE) 50 MCG/ACT nasal spray Place 2 sprays into both nostrils daily.    . furosemide (LASIX) 40 MG tablet Take 1 tablet (40 mg total) by mouth every morning. 30 tablet 3  . ketoconazole (NIZORAL) 2 % cream Apply 1 application topically 2 (two) times daily as needed for irritation. 30 g 2  . losartan (COZAAR) 100 MG tablet Take 1  tablet (100 mg total) by mouth daily. 30 tablet 5  . mirtazapine (REMERON) 15 MG tablet Take 15 mg by mouth at bedtime.    . norgestimate-ethinyl estradiol (ORTHO-CYCLEN,SPRINTEC,PREVIFEM) 0.25-35 MG-MCG tablet Take 1 tablet by mouth every evening. 1 Package 11  . nystatin cream (MYCOSTATIN) Apply 1 application topically 2 (two) times daily. 30 g 1  . omeprazole (PRILOSEC) 20 MG capsule TAKE 1 CAPSULE BY MOUTH TWICE DAILY 60 capsule 11  . potassium chloride (K-DUR) 10 MEQ tablet TAKE 1 TABLET BY MOUTH 2 TIMES DAILY. (Patient taking differently: Patient states she takes 1 pill q other day) 60 tablet 3  . Prenatal Vit-Fe Sulfate-FA (PRENATAL VITAMIN PO) Take 1 tablet by mouth every evening.     . Probiotic Product (PROBIOTIC DAILY PO) Take 500 mg by mouth daily.     . traZODone (DESYREL) 100 MG tablet Take 100 mg by mouth at bedtime.    Marland Kitchen venlafaxine (EFFEXOR) 50 MG tablet Take 50 mg by mouth 2 (two) times daily with a meal.     . [DISCONTINUED] potassium chloride (K-DUR,KLOR-CON) 10 MEQ tablet Take 1 tablet (10 mEq total) by mouth 2 (two) times daily. 30 tablet 3   No current facility-administered medications on file prior to visit.   Family History  Problem Relation Age of Onset  .  COPD Mother   . Hypertension Mother   . Cancer Father   . Diabetes Father   . Hypertension Father   . Heart disease Father   . Hyperlipidemia Father   . Asthma Sister   . Hyperlipidemia Brother   . Hypertension Brother   . Vision loss Brother   . Colon cancer Maternal Grandmother   . Esophageal cancer Neg Hx   . Pancreatic cancer Neg Hx   . Rectal cancer Neg Hx   . Stomach cancer Neg Hx    History   Social History  . Marital Status: Single    Spouse Name: N/A  . Number of Children: N/A  . Years of Education: N/A   Occupational History  . Not on file.   Social History Main Topics  . Smoking status: Former Smoker -- 1.00 packs/day for 20 years    Quit date: 02/22/2013  . Smokeless tobacco:  Never Used  . Alcohol Use: Yes     Comment: social  . Drug Use: No  . Sexual Activity: Not on file   Other Topics Concern  . Not on file   Social History Narrative    Review of Systems: See HPI    Objective:   Filed Vitals:   01/29/15 1603  BP: 136/99  Pulse: 96  Temp: 98.3 F (36.8 C)    Physical Exam  HENT:  Right Ear: External ear normal.  Left Ear: External ear normal.  Mouth/Throat: Oropharynx is clear and moist.  Eyes: EOM are normal. Pupils are equal, round, and reactive to light. Right eye exhibits no discharge. Left eye exhibits no discharge.  Cardiovascular: Normal rate, regular rhythm and normal heart sounds.   Pulmonary/Chest: Effort normal. She has wheezes.  Lymphadenopathy:    She has no cervical adenopathy.     Lab Results  Component Value Date   WBC 11.1* 10/01/2013   HGB 14.1 10/01/2013   HCT 42.4 10/01/2013   MCV 84.8 10/01/2013   PLT 308 10/01/2013   Lab Results  Component Value Date   CREATININE 1.05 12/24/2014   BUN 12 12/24/2014   NA 140 12/24/2014   K 4.9 12/24/2014   CL 102 12/24/2014   CO2 27 12/24/2014    No results found for: HGBA1C Lipid Panel  No results found for: CHOL, TRIG, HDL, CHOLHDL, VLDL, LDLCALC     Assessment and plan:   Stephanie Miles was seen today for uri.  Diagnoses and all orders for this visit:  Asthma with acute exacerbation, mild persistent Orders: -     albuterol (PROVENTIL) (2.5 MG/3ML) 0.083% nebulizer solution 2.5 mg; Take 3 mLs (2.5 mg total) by nebulization once. -    Begin predniSONE (DELTASONE) 10 MG tablet; Take 1 tablet (10 mg total) by mouth daily with breakfast. 6-5-4-3-2-1. Take 6 pills today and decrease by 1 pill per day Viral URI likely caused asthma exacerbation. She has been using maintenance inhaler daily, will give short course of steroid taper.   Viral URI with cough Encouraged increased fluids, rest, saline nasal spray as need, and continued use of inhalers.   Return if symptoms  worsen or fail to improve.        Lance Bosch, [facility] [phone] 01/29/2015, 4:18 PM'

## 2015-02-10 ENCOUNTER — Encounter: Payer: Self-pay | Admitting: Internal Medicine

## 2015-02-17 ENCOUNTER — Ambulatory Visit
Payer: No Typology Code available for payment source | Attending: Physical Medicine and Rehabilitation | Admitting: Physical Therapy

## 2015-02-17 DIAGNOSIS — M2560 Stiffness of unspecified joint, not elsewhere classified: Secondary | ICD-10-CM | POA: Insufficient documentation

## 2015-02-17 DIAGNOSIS — M25561 Pain in right knee: Secondary | ICD-10-CM | POA: Insufficient documentation

## 2015-02-17 DIAGNOSIS — M545 Low back pain, unspecified: Secondary | ICD-10-CM

## 2015-02-17 DIAGNOSIS — M62830 Muscle spasm of back: Secondary | ICD-10-CM | POA: Insufficient documentation

## 2015-02-17 DIAGNOSIS — R293 Abnormal posture: Secondary | ICD-10-CM | POA: Insufficient documentation

## 2015-02-17 DIAGNOSIS — M25562 Pain in left knee: Secondary | ICD-10-CM | POA: Insufficient documentation

## 2015-02-17 NOTE — Patient Instructions (Signed)
   Robert Sunga PT, DPT, LAT, ATC  [facility] Phone: [phone]     

## 2015-02-17 NOTE — Therapy (Signed)
[place], [place], [address] Phone: [phone]   Fax:  [phone]  Physical Therapy Evaluation  Patient Details  Name: Stephanie Miles MRN: [id number] Date of Birth: [birth year] Referring Provider:  [name], MD  Encounter Date: 02/17/2015      PT End of Session - 02/17/15 1307    Visit Number 1   Number of Visits 12   Date for PT Re-Evaluation 03/31/15   PT Start Time 3710   PT Stop Time 1100   PT Time Calculation (min) 45 min   Activity Tolerance Patient tolerated treatment well   Behavior During Therapy [facility] for tasks assessed/performed      Past Medical History  Diagnosis Date  . Hypertension   . GERD (gastroesophageal reflux disease)   . IBS (irritable bowel syndrome)   . Migraine headache   . S/P appy 05/2013  . Anxiety   . Allergy   . Depression   . Heart murmur   . Chronic kidney disease     kidney stones  . Asthma   . Aortic regurgitation due to bicuspid aortic valve     Past Surgical History  Procedure Laterality Date  . Dental surgery    . Laparoscopic appendectomy N/A 06/06/2013    Procedure: APPENDECTOMY LAPAROSCOPIC;  Surgeon: Imogene Burn. Georgette Dover, MD;  Location: [facility];  Service: General;  Laterality: N/A;  . Appendectomy      There were no vitals filed for this visit.  Visit Diagnosis:  Midline low back pain without sciatica - Plan: PT plan of care cert/re-cert  Joint stiffness of spine - Plan: PT plan of care cert/re-cert  Muscle spasm of back - Plan: PT plan of care cert/re-cert  Bilateral knee pain - Plan: PT plan of care cert/re-cert  Abnormal posture - Plan: PT plan of care cert/re-cert      Subjective Assessment - 02/17/15 1024    Subjective pt is a 34 y.o F with CC of bil knee pain and low back pain. The knees have been going on since she was young but the last 2 years the knees have really started getting worse. June of last year she reached  forward while  doing laundry and had awful pain in the back requring taken to hospital ambulance. Since on set pain for both knees and back stay the same.     Limitations Sitting;Lifting;Standing;Walking;House hold activities   How long can you sit comfortably? 30 min   How long can you stand comfortably? 15-20 min   How long can you walk comfortably? 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 Yes   Pain Score 6    Pain Location Back   Pain Orientation Mid;Lower;Right;Left   Pain Descriptors / Indicators Crushing;Tightness   Pain Type Chronic pain   Pain Frequency Constant   Aggravating Factors  bending forward, lifting, prolonged standing, prolonged sitting   Pain Relieving Factors walking, heat,    Multiple Pain Sites Yes   Pain Score 5   Pain Location Knee   Pain Orientation Left;Right;Anterior   Pain Descriptors / Indicators Aching   Pain Type Chronic pain   Pain Onset More than a month ago   Pain Frequency Constant   Aggravating Factors  stairs with descending is worse,    Pain Relieving Factors ice, elevation  [facility] PT Assessment - 02/17/15 1035    Assessment   Medical Diagnosis low back pain and bil knee pain   Onset Date/Surgical Date --  low back June 2015, bil knees for many years   Hand Dominance Right   Next MD Visit PRN   Prior Therapy no   Precautions   Precautions None   Restrictions   Weight Bearing Restrictions No   Balance Screen   Has the patient fallen in the past 6 months Yes   How many times? 2   Has the patient had a decrease in activity level because of a fear of falling?  Yes   Is the patient reluctant to leave their home because of a fear of falling?  No   Home Environment   Living Environment Private residence   Living Arrangements Alone   Type of 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 sitting, walking to other classes   Leisure going to the pool, backpacking,    Cognition   Overall Cognitive Status Within Functional Limits for tasks assessed   Observation/Other Assessments   Focus on Therapeutic Outcomes (FOTO)  69% limited  predicted 47%    Posture/Postural Control   Posture/Postural Control Postural limitations   Postural Limitations Rounded Shoulders;Forward head;Increased lumbar lordosis   ROM / Strength   AROM / PROM / Strength AROM;PROM;Strength   AROM   AROM Assessment Site Lumbar;Knee   Right/Left Knee Right;Left   Right Knee Extension 0   Right Knee Flexion 135   Left Knee Extension -2   Left Knee Flexion 130   Lumbar Flexion 50  pain at end range   Lumbar Extension 12  pain during movement   Lumbar - Right Side Bend 35   Lumbar - Left Side Bend 35   Lumbar - Right Rotation 25%   Lumbar - Left Rotation 25%  with pain during movement   Strength   Strength Assessment Site Lumbar;Knee   Right/Left Knee Right;Left   Right Knee Flexion 4+/5   Right Knee Extension 4+/5   Left Knee Flexion 4+/5   Left Knee Extension 4+/5  pain during testing   Palpation   Patella mobility pain during testing with normal mobility in bil patellas with L more painful than Ri   Palpation comment tenderness noted per-patellar   Special Tests    Special Tests Knee Special Tests;Lumbar   Lumbar Tests Slump Test;Prone Knee Bend Test;Straight Leg Raise   Knee Special tests  Step-up/Step Down Test;Patellofemoral Grind Test (Clarke's Sign);Patellofemoral Apprehension Test;McConnell Test   Slump test   Findings Negative   Prone Knee Bend Test   Findings Negative   Straight Leg Raise   Findings Negative    McConnell Test   Findings Negative   Patellofemoral Apprehension Test    Findings Negative   Step-up/Step Down    Findings Positive   Patellofemoral Grind test (Clark's Sign)   Findings Postive                           PT Education - 02/17/15 1306    Education provided Yes   Education Details Evaluation findings, POC, Goals, HEP  Person(s) Educated Patient   Methods Explanation   Comprehension Verbalized understanding          PT Short Term Goals - 02/17/15 1317    PT SHORT TERM GOAL #1   Title pt will be I with basice HEP (03/10/2015)   Time 3   Period Weeks   Status New   PT SHORT TERM GOAL #2   Title pt will be bale to verbalize and demonstate tehcniques to reduce low back pain via postural awareness, lifting and carrying mechanics, and HEP (03/10/2015)   Time 3   Period Weeks   Status New           PT Long Term Goals - 02/17/15 1319    PT LONG TERM GOAL #1   Title upon discharge pt will be I with all HEP given throughout therapy (03/31/2015)   Time 6   Period Weeks   Status New   PT LONG TERM GOAL #2   Title She will be able to navigate >10 steps and walk or stand for > 30 minutes with < 2/10 pain to assist with walking 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 (03/31/2015)   Time 6   Period Weeks   Status New   PT LONG TERM GOAL #3   Title pt will increase her lumbar mobility by > 10 degrees in all planes to help with ADLs (03/31/2015)   Time 6   Period Weeks   Status New   PT LONG TERM GOAL #4   Title She will demonstrate no lumbar or hip muscular spasm to assist with standing walking endurance and trunk mobility (03/31/2015)   Time 6   Period Weeks   Status New   PT LONG TERM GOAL #5   Title She will increase her FOTO score to > 53 to demonstrate improved functional capacity upon discharge (03/31/2015)   Time 6   Period Weeks   Status New               Plan - 02/17/15 1307    Clinical Impression Statement Mayela  presents to OPPT with CC of low back pain that started last June, and the knees have been bothering her since she was young. the low back she demonstrates limited trunk mobility with pain at endrange flexion and during motion with extension.  all special testing for the low back were negative. palpaiton revealed hypomobility at L3-L5 with pain upon palation and lumbar parapsinal spasm. she exhibits abnormal posture with increased lumbar lordosis,anterior pelvic tilt, forward head posture and anteriorly rolled shoulders. Bil knees are tender upon palapiton peri-patellar and demonstrates normal patellar mobility in all planes exhibiting tenderness upon assessment. Strength and arom are WNL. Step down test is positive for pain and medial collapse. She would benefit from skilled physical therapy to maxmize her function and decreaes pain by addressing the impairments listed.    Pt will benefit from skilled therapeutic intervention in order to improve on the following deficits Pain;Improper body mechanics;Postural dysfunction;Decreased mobility;Decreased activity tolerance;Decreased endurance;Decreased range of motion;Decreased strength;Hypomobility;Decreased balance   Rehab Potential Good   PT Frequency 2x / week   PT Duration 6 weeks   PT Treatment/Interventions ADLs/Self Care Home Management;Cryotherapy;Electrical Stimulation;Iontophoresis 45m/ml Dexamethasone;Moist Heat;Ultrasound;Gait training;Therapeutic activities;Therapeutic exercise;Neuromuscular re-education;Manual techniques;Passive range of motion;Dry needling;Taping;Patient/family education   PT Next Visit Plan assess response to HEP, lumbar mobility, core strengthening, Check hip strength,    PT Home Exercise Plan see HEP   Consulted and Agree with Plan of  Care Patient         Problem List Patient Active Problem List   Diagnosis Date Noted  . Aortic regurgitation due to bicuspid aortic valve 03/12/2014  . Bilateral lower extremity edema  03/12/2014  . Personal history of tobacco use, presenting hazards to health 01/20/2014  . Anxiety state, unspecified 10/16/2013  . Ileus 07/08/2013  . Migraine 07/08/2013  . Sleep apnea 07/08/2013  . UTI (urinary tract infection) 07/08/2013  . IBS (irritable bowel syndrome) 06/06/2013  . GERD (gastroesophageal reflux disease) 06/06/2013  . HTN (hypertension) 06/06/2013   Starr Lake PT, DPT, LAT, ATC  02/17/2015  1:28 PM    [facility] [address] [place], [place], [address] Phone: [phone]   Fax:  [phone]

## 2015-03-04 ENCOUNTER — Ambulatory Visit
Payer: No Typology Code available for payment source | Attending: Physical Medicine and Rehabilitation | Admitting: Physical Therapy

## 2015-03-04 DIAGNOSIS — M545 Low back pain, unspecified: Secondary | ICD-10-CM

## 2015-03-04 DIAGNOSIS — R293 Abnormal posture: Secondary | ICD-10-CM | POA: Insufficient documentation

## 2015-03-04 DIAGNOSIS — M25561 Pain in right knee: Secondary | ICD-10-CM | POA: Insufficient documentation

## 2015-03-04 DIAGNOSIS — M25562 Pain in left knee: Secondary | ICD-10-CM | POA: Insufficient documentation

## 2015-03-04 DIAGNOSIS — M62830 Muscle spasm of back: Secondary | ICD-10-CM | POA: Insufficient documentation

## 2015-03-04 DIAGNOSIS — M2560 Stiffness of unspecified joint, not elsewhere classified: Secondary | ICD-10-CM | POA: Insufficient documentation

## 2015-03-04 NOTE — Patient Instructions (Signed)
Use a wheel barrow or wagon to carry 50 LBS of mulch.  Sleeping on Back  Place pillow under knees. A pillow with cervical support and a roll around waist are also helpful. Copyright  VHI. All rights reserved.  Sleeping on Side Place pillow between knees. Use cervical support under neck and a roll around waist as needed. Copyright  VHI. All rights reserved.   Sleeping on Stomach   If this is the only desirable sleeping position, place pillow under lower legs, and under stomach or chest as needed.  Posture - Sitting   Sit upright, head facing forward. Try using a roll to support lower back. Keep shoulders relaxed, and avoid rounded back. Keep hips level with knees. Avoid crossing legs for long periods. Stand to Sit / Sit to Stand   To sit: Bend knees to lower self onto front edge of chair, then scoot back on seat. To stand: Reverse sequence by placing one foot forward, and scoot to front of seat. Use rocking motion to stand up.   Work Height and Reach  Ideal work height is no more than 2 to 4 inches below elbow level when standing, and at elbow level when sitting. Reaching should be limited to arm's length, with elbows slightly bent.  Bending  Bend at hips and knees, not back. Keep feet shoulder-width apart.    Posture - Standing   Good posture is important. Avoid slouching and forward head thrust. Maintain curve in low back and align ears over shoul- ders, hips over ankles.  Alternating Positions   Alternate tasks and change positions frequently to reduce fatigue and muscle tension. Take rest breaks. Computer Work   Position work to Programmer, multimedia. Use proper work and seat height. Keep shoulders back and down, wrists straight, and elbows at right angles. Use chair that provides full back support. Add footrest and lumbar roll as needed.  Getting Into / Out of Car  Lower self onto seat, scoot back, then bring in one leg at a time. Reverse sequence to get  out.  Dressing  Lie on back to pull socks or slacks over feet, or sit and bend leg while keeping back straight.    Housework - Sink  Place one foot on ledge of cabinet under sink when standing at sink for prolonged periods.   Pushing / Pulling  Pushing is preferable to pulling. Keep back in proper alignment, and use leg muscles to do the work.  Deep Squat   Squat and lift with both arms held against upper trunk. Tighten stomach muscles without holding breath. Use smooth movements to avoid jerking.  Avoid Twisting   Avoid twisting or bending back. Pivot around using foot movements, and bend at knees if needed when reaching for articles.  Carrying Luggage   Distribute weight evenly on both sides. Use a cart whenever possible. Do not twist trunk. Move body as a unit.   Lifting Principles .Maintain proper posture and head alignment. .Slide object as close as possible before lifting. .Move obstacles out of the way. .Test before lifting; ask for help if too heavy. .Tighten stomach muscles without holding breath. .Use smooth movements; do not jerk. .Use legs to do the work, and pivot with feet. .Distribute the work load symmetrically and close to the center of trunk. .Push instead of pull whenever possible.   Ask For Help   Ask for help and delegate to others when possible. Coordinate your movements when lifting together, and maintain the low back curve.  Log Roll   Lying on back, bend left knee and place left arm across chest. Roll all in one movement to the right. Reverse to roll to the left. Always move as one unit. Housework - Sweeping  Use long-handled equipment to avoid stooping.   Housework - Wiping  Position yourself as close as possible to reach work surface. Avoid straining your back.  Laundry - Unloading Wash   To unload small items at bottom of washer, lift leg opposite to arm being used to reach.  [place] close to area to be  raked. Use arm movements to do the work. Keep back straight and avoid twisting.     Cart  When reaching into cart with one arm, lift opposite leg to keep back straight.   Getting Into / Out of Bed  Lower self to lie down on one side by raising legs and lowering head at the same time. Use arms to assist moving without twisting. Bend both knees to roll onto back if desired. To sit up, start from lying on side, and use same move-ments in reverse. Housework - Vacuuming  Hold the vacuum with arm held at side. Step back and forth to move it, keeping head up. Avoid twisting.   Laundry - IT consultant so that bending and twisting can be avoided.   Laundry - Unloading Dryer  Squat down to reach into clothes dryer or use a reacher.  Gardening - Weeding / Probation officer or Kneel. Knee pads may be helpful.

## 2015-03-04 NOTE — Therapy (Signed)
[place], [place], [address] Phone: [phone]   Fax:  [phone]  Physical Therapy Treatment  Patient Details  Name: Stephanie Miles MRN: [id number] Date of Birth: [birth year] Referring Provider:  Lance Bosch, NP  Encounter Date: 03/04/2015      PT End of Session - 03/04/15 1122    Visit Number 2   Number of Visits 12   Date for PT Re-Evaluation 03/31/15   PT Start Time 1018   PT Stop Time 1130   PT Time Calculation (min) 72 min   Activity Tolerance Patient tolerated treatment well;No increased pain   Behavior During Therapy [facility] for tasks assessed/performed      Past Medical History  Diagnosis Date  . Hypertension   . GERD (gastroesophageal reflux disease)   . IBS (irritable bowel syndrome)   . Migraine headache   . S/P appy 05/2013  . Anxiety   . Allergy   . Depression   . Heart murmur   . Chronic kidney disease     kidney stones  . Asthma   . Aortic regurgitation due to bicuspid aortic valve     Past Surgical History  Procedure Laterality Date  . Dental surgery    . Laparoscopic appendectomy N/A 06/06/2013    Procedure: APPENDECTOMY LAPAROSCOPIC;  Surgeon: Imogene Burn. Georgette Dover, MD;  Location: [place];  Service: General;  Laterality: N/A;  . Appendectomy      There were no vitals filed for this visit.  Visit Diagnosis:  Midline low back pain without sciatica  Joint stiffness of spine  Muscle spasm of back  Bilateral knee pain  Abnormal posture      Subjective Assessment - 03/04/15 1025    Subjective Robaxin 500 MG is the medication she has , but she cannot take it makes her sleep.    Mobic does not work anymore.    Hurts all over today but mostly back and knees.     Currently in Pain? Yes   Pain Score 7    Pain Location Back   Pain Orientation Mid;Left;Right   Aggravating Factors  moving bending , lifting, long standing long sitting.   Pain Relieving Factors moving  first thing in am  gentle stretching   Multiple Pain Sites Yes   Pain Score 5   Pain Location Knee   Pain Orientation Right;Left;Anterior  LT> RT   Pain Descriptors / Indicators Aching   Pain Frequency Constant   Pain Relieving Factors ice elevation                          [facility] Adult PT Treatment/Exercise - 03/04/15 1033    Self-Care   Self-Care --  ADL education   Lumbar Exercises: Stretches   Passive Hamstring Stretch 1 rep;30 seconds  good technique   Single Knee to Chest Stretch 1 rep  Cued for hands at thigh to hepl knee pain   Lower Trunk Rotation 2 reps  good technique   Pelvic Tilt --  10 reps 8/10 pain   Moist Heat Therapy   Number Minutes Moist Heat 10 Minutes   Moist Heat Location Lumbar Spine   Cryotherapy   Number Minutes Cryotherapy 10 Minutes   Cryotherapy Location Knee  both   Type of Cryotherapy --  cold pack.                PT Education - 03/04/15 1119    Education provided  Yes   Education Details ADL's   Person(s) Educated Patient   Methods Explanation;Demonstration;Verbal cues;Handout   Comprehension Verbalized understanding          PT Short Term Goals - 02/17/15 1317    PT SHORT TERM GOAL #1   Title pt will be I with basice HEP (03/10/2015)   Time 3   Period Weeks   Status New   PT SHORT TERM GOAL #2   Title pt will be bale to verbalize and demonstate tehcniques to reduce low back pain via postural awareness, lifting and carrying mechanics, and HEP (03/10/2015)   Time 3   Period Weeks   Status New           PT Long Term Goals - 02/17/15 1319    PT LONG TERM GOAL #1   Title upon discharge pt will be I with all HEP given throughout therapy (03/31/2015)   Time 6   Period Weeks   Status New   PT LONG TERM GOAL #2   Title She will be able to navigate >10 steps and walk or stand for > 30 minutes with < 2/10 pain to assist with walking 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 (03/31/2015)   Time 6   Period  Weeks   Status New   PT LONG TERM GOAL #3   Title pt will increase her lumbar mobility by > 10 degrees in all planes to help with ADLs (03/31/2015)   Time 6   Period Weeks   Status New   PT LONG TERM GOAL #4   Title She will demonstrate no lumbar or hip muscular spasm to assist with standing walking endurance and trunk mobility (03/31/2015)   Time 6   Period Weeks   Status New   PT LONG TERM GOAL #5   Title She will increase her FOTO score to > 53 to demonstrate improved functional capacity upon discharge (03/31/2015)   Time 6   Period Weeks   Status New               Plan - 03/04/15 1141    PT Next Visit Plan Check hip strength, answer any body mechanics question, lumbar mobility.  Multifitus?   PT Home Exercise Plan Try to use good body mechanics   Consulted and Agree with Plan of Care Patient        Problem List Patient Active Problem List   Diagnosis Date Noted  . Aortic regurgitation due to bicuspid aortic valve 03/12/2014  . Bilateral lower extremity edema 03/12/2014  . Personal history of tobacco use, presenting hazards to health 01/20/2014  . Anxiety state, unspecified 10/16/2013  . Ileus 07/08/2013  . Migraine 07/08/2013  . Sleep apnea 07/08/2013  . UTI (urinary tract infection) 07/08/2013  . IBS (irritable bowel syndrome) 06/06/2013  . GERD (gastroesophageal reflux disease) 06/06/2013  . HTN (hypertension) 06/06/2013    HARRIS,KAREN 03/04/2015, 12:06 PM  [facility] [address] [place], [place], [address] Phone: [phone]   Fax:  [phone]     Melvenia Needles, PTA 03/04/2015 12:06 PM Phone: [phone] Fax: [phone]

## 2015-03-06 ENCOUNTER — Ambulatory Visit: Payer: No Typology Code available for payment source | Admitting: Physical Therapy

## 2015-03-06 DIAGNOSIS — M25562 Pain in left knee: Secondary | ICD-10-CM

## 2015-03-06 DIAGNOSIS — M545 Low back pain, unspecified: Secondary | ICD-10-CM

## 2015-03-06 DIAGNOSIS — M2560 Stiffness of unspecified joint, not elsewhere classified: Secondary | ICD-10-CM

## 2015-03-06 DIAGNOSIS — R293 Abnormal posture: Secondary | ICD-10-CM

## 2015-03-06 DIAGNOSIS — M62830 Muscle spasm of back: Secondary | ICD-10-CM

## 2015-03-06 DIAGNOSIS — M25561 Pain in right knee: Secondary | ICD-10-CM

## 2015-03-06 NOTE — Patient Instructions (Signed)
PELVIC TILT WITH CLAM SHELL 10 RepS X 2 , 2 SESSIONS PER DAY.- SEE HANDOUT  Hip Extension (Prone)  Abdominal tight and pubic bone pressed into bed/table Lift left leg __6__ inches from floor, keeping knee locked. Repeat __10__ times per set. Do _1-2___ sets per session. Do __2__ sessions per day.  http://orth.exer.us/98   Copyright  VHI. All rights reserved.  Hip Adduction: Leg Lift (Eccentric) - Side-Lying  Abdominals tight  Lie on side with top leg bent, foot flat behind lower leg. Quickly lift lower leg. Slowly lower for 3-5 seconds. _10__ reps per set, ___ sets per session, 2 sessions per day Add ___ lbs when you achieve ___ repetitions.  Copyright  VHI. All rights reserved.  Abduction: Side Leg Lift (Eccentric) - Side-Lying  Abdominals tight Lie on side. Lift top leg slightly higher than shoulder level. Keep top leg straight with body, toes pointing forward. Slowly lower for 3-5 seconds. __10_ reps per set, _2__ sets per session, __2_ session per day Add ___ lbs when you achieve ___ repetitions.  Copyright  VHI. All rights reserved.  Strengthening: Straight Leg Raise (Phase 1)   Tighten muscles on front of right thigh, then lift leg __12__ inches from surface, keeping knee locked abdominals tight Repeat ___10_ times per set. Do __2__ sets per session. Do __2__ sessions per day.  http://orth.exer.us/614   Copyright  VHI. All rights reserved.

## 2015-03-06 NOTE — Therapy (Signed)
[place], [place], [address] Phone: [phone]   Fax:  [phone]  Physical Therapy Treatment  Patient Details  Name: Stephanie Miles MRN: [id number] Date of Birth: [birth year] Referring Provider:  Lance Bosch, NP  Encounter Date: 03/06/2015      PT End of Session - 03/06/15 1016    Visit Number 3   Number of Visits 12   Date for PT Re-Evaluation 03/31/15   PT Start Time 0939   PT Stop Time 1028   PT Time Calculation (min) 49 min      Past Medical History  Diagnosis Date  . Hypertension   . GERD (gastroesophageal reflux disease)   . IBS (irritable bowel syndrome)   . Migraine headache   . S/P appy 05/2013  . Anxiety   . Allergy   . Depression   . Heart murmur   . Chronic kidney disease     kidney stones  . Asthma   . Aortic regurgitation due to bicuspid aortic valve     Past Surgical History  Procedure Laterality Date  . Dental surgery    . Laparoscopic appendectomy N/A 06/06/2013    Procedure: APPENDECTOMY LAPAROSCOPIC;  Surgeon: Imogene Burn. Georgette Dover, MD;  Location: [place];  Service: General;  Laterality: N/A;  . Appendectomy      There were no vitals filed for this visit.  Visit Diagnosis:  Midline low back pain without sciatica  Joint stiffness of spine  Muscle spasm of back  Abnormal posture  Bilateral knee pain      Subjective Assessment - 03/06/15 0947    Subjective 4/10 all over body pain. 6/10 back and knees. I tried some of the things from the body mechanics handout and they helped alot in the grocerry store. I was spreading mulch with a rake last night, it was painful. I  tried to be mindful of my body mechanics.    Currently in Pain? Yes   Pain Score 6    Pain Location Knee  low back            [facility] PT Assessment - 03/06/15 0942    Strength   Strength Assessment Site Hip   Right/Left Hip Right;Left   Right Hip Flexion 4/5   Right Hip Extension 4/5   Right Hip ABduction 4+/5   Right Hip ADduction 4/5   Left Hip Flexion 4/5   Left Hip Extension 4-/5   Left Hip ABduction 4/5   Left Hip ADduction 4-/5                     [facility] Adult PT Treatment/Exercise - 03/06/15 0951    Lumbar Exercises: Stretches   Passive Hamstring Stretch 3 reps;30 seconds   Single Knee to Chest Stretch 3 reps;30 seconds   Lower Trunk Rotation 3 reps;30 seconds   Pelvic Tilt 5 reps;10 seconds  no c/o pain- cues for Tra contract   Pelvic Tilt Limitations added 10 clams X 2   Knee/Hip Exercises: Supine   Other Supine Knee/Hip Exercises Mat 4 way hip bilateral x 10 each with Tra and multifidus engagement    Moist Heat Therapy   Number Minutes Moist Heat 10 Minutes   Moist Heat Location Lumbar Spine   Cryotherapy   Number Minutes Cryotherapy 10 Minutes   Cryotherapy Location Knee  both   Type of Cryotherapy Ice pack  PT Education - 03/06/15 1008    Education provided Yes   Education Details 4 way hip, pelvic tilt with clam   Person(s) Educated Patient   Methods Explanation;Handout   Comprehension Verbalized understanding          PT Short Term Goals - 03/06/15 0953    PT SHORT TERM GOAL #1   Title pt will be I with basice HEP (03/10/2015)   Time 3   Period Weeks   Status Achieved   PT SHORT TERM GOAL #2   Title pt will be bale to verbalize and demonstate tehcniques to reduce low back pain via postural awareness, lifting and carrying mechanics, and HEP (03/10/2015)   Time 3   Period Weeks   Status Achieved           PT Long Term Goals - 02/17/15 1319    PT LONG TERM GOAL #1   Title upon discharge pt will be I with all HEP given throughout therapy (03/31/2015)   Time 6   Period Weeks   Status New   PT LONG TERM GOAL #2   Title She will be able to navigate >10 steps and walk or stand for > 30 minutes with < 2/10 pain to assist with walking 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 (03/31/2015)   Time 6    Period Weeks   Status New   PT LONG TERM GOAL #3   Title pt will increase her lumbar mobility by > 10 degrees in all planes to help with ADLs (03/31/2015)   Time 6   Period Weeks   Status New   PT LONG TERM GOAL #4   Title She will demonstrate no lumbar or hip muscular spasm to assist with standing walking endurance and trunk mobility (03/31/2015)   Time 6   Period Weeks   Status New   PT LONG TERM GOAL #5   Title She will increase her FOTO score to > 53 to demonstrate improved functional capacity upon discharge (03/31/2015)   Time 6   Period Weeks   Status New               Plan - 03/06/15 5093    Clinical Impression Statement STG #1, #2 Met. Left >Right hip weakness. Instructed pt in 4 way hip for HEP as well and progressed pelvic tilt to adding clams with verbal cues for Tra engagment.    PT Next Visit Plan Continue Lumbar mobiliity Tra. multifidus training. Review 4 way Hip and pelvic tilt with clam        Problem List Patient Active Problem List   Diagnosis Date Noted  . Aortic regurgitation due to bicuspid aortic valve 03/12/2014  . Bilateral lower extremity edema 03/12/2014  . Personal history of tobacco use, presenting hazards to health 01/20/2014  . Anxiety state, unspecified 10/16/2013  . Ileus 07/08/2013  . Migraine 07/08/2013  . Sleep apnea 07/08/2013  . UTI (urinary tract infection) 07/08/2013  . IBS (irritable bowel syndrome) 06/06/2013  . GERD (gastroesophageal reflux disease) 06/06/2013  . HTN (hypertension) 06/06/2013    Dorene Ar, PTA 03/06/2015, 10:17 AM  [facility] [address] [place], [place], [address] Phone: [phone]   Fax:  [phone]

## 2015-03-09 ENCOUNTER — Other Ambulatory Visit: Payer: Self-pay | Admitting: Internal Medicine

## 2015-03-09 ENCOUNTER — Ambulatory Visit: Payer: No Typology Code available for payment source | Admitting: Physical Therapy

## 2015-03-09 DIAGNOSIS — M25561 Pain in right knee: Secondary | ICD-10-CM

## 2015-03-09 DIAGNOSIS — M545 Low back pain, unspecified: Secondary | ICD-10-CM

## 2015-03-09 DIAGNOSIS — M2560 Stiffness of unspecified joint, not elsewhere classified: Secondary | ICD-10-CM

## 2015-03-09 DIAGNOSIS — R293 Abnormal posture: Secondary | ICD-10-CM

## 2015-03-09 DIAGNOSIS — M25562 Pain in left knee: Secondary | ICD-10-CM

## 2015-03-09 DIAGNOSIS — M62830 Muscle spasm of back: Secondary | ICD-10-CM

## 2015-03-09 NOTE — Patient Instructions (Signed)

## 2015-03-09 NOTE — Therapy (Signed)
[place], [place], [address] Phone: [phone]   Fax:  [phone]  Physical Therapy Treatment  Patient Details  Name: Stephanie Miles MRN: [id number] Date of Birth: 1980-12-15 Referring Provider:  Lance Bosch, NP  Encounter Date: 03/09/2015      PT End of Session - 03/09/15 0940    Visit Number 4   Number of Visits 12   Date for PT Re-Evaluation 03/31/15   PT Start Time 0850   PT Stop Time 0945   PT Time Calculation (min) 55 min   Activity Tolerance Patient tolerated treatment well   Behavior During Therapy [facility] for tasks assessed/performed      Past Medical History  Diagnosis Date  . Hypertension   . GERD (gastroesophageal reflux disease)   . IBS (irritable bowel syndrome)   . Migraine headache   . S/P appy 05/2013  . Anxiety   . Allergy   . Depression   . Heart murmur   . Chronic kidney disease     kidney stones  . Asthma   . Aortic regurgitation due to bicuspid aortic valve     Past Surgical History  Procedure Laterality Date  . Dental surgery    . Laparoscopic appendectomy N/A 06/06/2013    Procedure: APPENDECTOMY LAPAROSCOPIC;  Surgeon: Imogene Burn. Georgette Dover, MD;  Location: [place];  Service: General;  Laterality: N/A;  . Appendectomy      There were no vitals filed for this visit.  Visit Diagnosis:  Midline low back pain without sciatica  Joint stiffness of spine  Muscle spasm of back  Abnormal posture  Bilateral knee pain      Subjective Assessment - 03/09/15 0855    Subjective "The back is doing ok, it isn't as bad this morning as it has been" pt reports trying the muscles relaxers, but they are hard to take and they still make her sleepy.    Currently in Pain? Yes   Pain Score 5    Pain Location Knee   Pain Orientation Right   Pain Descriptors / Indicators --  rubbing   Pain Type Chronic pain   Pain Onset More than a month ago   Pain Frequency Intermittent   Pain Score 6   Pain Location Back   Pain Orientation Right;Left;Lower   Pain Descriptors / Indicators Aching   Pain Type Chronic pain   Pain Onset More than a month ago   Pain Frequency Constant   Aggravating Factors  stairs                         [facility] Adult PT Treatment/Exercise - 03/09/15 0001    Self-Care   Self-Care Other Self-Care Comments   Other Self-Care Comments  educated on how to use physioball underneath legs to help facilitate decompression of the lumbar spine   Lumbar Exercises: Stretches   Passive Hamstring Stretch 2 reps;30 seconds  with strap   Lower Trunk Rotation 4 reps;30 seconds   Pelvic Tilt 5 reps;10 seconds   Lumbar Exercises: Aerobic   Stationary Bike NuStep L 4 x 6 min   Lumbar Exercises: Prone   Other Prone Lumbar Exercises posterior pelvic tilt 2 x 10 with 5 sec hold  VC for multifidus activation   Modalities   Modalities Electrical Stimulation   Moist Heat Therapy   Number Minutes Moist Heat 10 Minutes   Moist Heat Location Lumbar Spine   Cryotherapy   Number  Minutes Cryotherapy 10 Minutes   Cryotherapy Location Knee  bil   Type of Cryotherapy Ice pack   Electrical Stimulation   Electrical Stimulation Location low back   Electrical Stimulation Action IFC   Electrical Stimulation Parameters 10 min, 100% scan, level 9   Electrical Stimulation Goals Pain                PT Education - 03/09/15 0940    Education provided Yes   Education Details dry needling education, e-stim education   Person(s) Educated Patient   Methods Explanation   Comprehension Verbalized understanding          PT Short Term Goals - 03/06/15 0953    PT SHORT TERM GOAL #1   Title pt will be I with basice HEP (03/10/2015)   Time 3   Period Weeks   Status Achieved   PT SHORT TERM GOAL #2   Title pt will be bale to verbalize and demonstate tehcniques to reduce low back pain via postural awareness, lifting and carrying mechanics, and HEP  (03/10/2015)   Time 3   Period Weeks   Status Achieved           PT Long Term Goals - 02/17/15 1319    PT LONG TERM GOAL #1   Title upon discharge pt will be I with all HEP given throughout therapy (03/31/2015)   Time 6   Period Weeks   Status New   PT LONG TERM GOAL #2   Title She will be able to navigate >10 steps and walk or stand for > 30 minutes with < 2/10 pain to assist with walking 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 (03/31/2015)   Time 6   Period Weeks   Status New   PT LONG TERM GOAL #3   Title pt will increase her lumbar mobility by > 10 degrees in all planes to help with ADLs (03/31/2015)   Time 6   Period Weeks   Status New   PT LONG TERM GOAL #4   Title She will demonstrate no lumbar or hip muscular spasm to assist with standing walking endurance and trunk mobility (03/31/2015)   Time 6   Period Weeks   Status New   PT LONG TERM GOAL #5   Title She will increase her FOTO score to > 53 to demonstrate improved functional capacity upon discharge (03/31/2015)   Time 6   Period Weeks   Status New               Plan - 03/09/15 0940    Clinical Impression Statement Juliyah presents to therapy with report that she is doing better since the last visit. She was able to finish all execise without increased pain. She reported significant pain relief following STM of the bil parapsinals and trigger point release of the lumbar multifidus. Educated about  dry needling and gave handout and pt reported she would be interested in trying it for relief. answered questions regarding posture.    PT Next Visit Plan Continue Lumbar mobiliity Tra. multifidus training. Review 4 way Hip and pelvic tilt with clam, dry needling?    PT Home Exercise Plan dry needling education   Consulted and Agree with Plan of Care Patient        Problem List Patient Active Problem List   Diagnosis Date Noted  . Aortic regurgitation due to bicuspid aortic valve 03/12/2014  . Bilateral lower  extremity edema 03/12/2014  . Personal history  of tobacco use, presenting hazards to health 01/20/2014  . Anxiety state, unspecified 10/16/2013  . Ileus 07/08/2013  . Migraine 07/08/2013  . Sleep apnea 07/08/2013  . UTI (urinary tract infection) 07/08/2013  . IBS (irritable bowel syndrome) 06/06/2013  . GERD (gastroesophageal reflux disease) 06/06/2013  . HTN (hypertension) 06/06/2013   Starr Lake PT, DPT, LAT, ATC  03/09/2015  10:04 AM    [facility] [address] [place], [place], [address] Phone: [phone]   Fax:  [phone]

## 2015-03-11 ENCOUNTER — Other Ambulatory Visit: Payer: Self-pay | Admitting: Cardiology

## 2015-03-16 ENCOUNTER — Ambulatory Visit: Payer: No Typology Code available for payment source | Admitting: Physical Therapy

## 2015-03-16 DIAGNOSIS — M25562 Pain in left knee: Secondary | ICD-10-CM

## 2015-03-16 DIAGNOSIS — M2560 Stiffness of unspecified joint, not elsewhere classified: Secondary | ICD-10-CM

## 2015-03-16 DIAGNOSIS — M545 Low back pain, unspecified: Secondary | ICD-10-CM

## 2015-03-16 DIAGNOSIS — M62830 Muscle spasm of back: Secondary | ICD-10-CM

## 2015-03-16 DIAGNOSIS — M25561 Pain in right knee: Secondary | ICD-10-CM

## 2015-03-16 DIAGNOSIS — R293 Abnormal posture: Secondary | ICD-10-CM

## 2015-03-16 NOTE — Therapy (Signed)
[place], [place], [address] Phone: [phone]   Fax:  [phone]  Physical Therapy Treatment  Patient Details  Name: Stephanie Miles MRN: [id number] Date of Birth: 13-Apr-1981 Referring Provider:  Lance Bosch, NP  Encounter Date: 03/16/2015      PT End of Session - 03/16/15 1722    Visit Number 5   Date for PT Re-Evaluation 03/31/15   PT Start Time 9678   PT Stop Time 1735   PT Time Calculation (min) 60 min   Activity Tolerance Patient tolerated treatment well;No increased pain  less pain at end of session   Behavior During Therapy [facility] for tasks assessed/performed      Past Medical History  Diagnosis Date  . Hypertension   . GERD (gastroesophageal reflux disease)   . IBS (irritable bowel syndrome)   . Migraine headache   . S/P appy 05/2013  . Anxiety   . Allergy   . Depression   . Heart murmur   . Chronic kidney disease     kidney stones  . Asthma   . Aortic regurgitation due to bicuspid aortic valve     Past Surgical History  Procedure Laterality Date  . Dental surgery    . Laparoscopic appendectomy N/A 06/06/2013    Procedure: APPENDECTOMY LAPAROSCOPIC;  Surgeon: Stephanie Burn. Stephanie Dover, MD;  Location: [place];  Service: General;  Laterality: N/A;  . Appendectomy      There were no vitals filed for this visit.  Visit Diagnosis:  Midline low back pain without sciatica  Joint stiffness of spine  Muscle spasm of back  Abnormal posture  Bilateral knee pain      Subjective Assessment - 03/16/15 1640    Subjective 7/10  lower back,  knees 5./10.  7/10 on steps.  Friday Lt hip did not feel like it was not in the socket.  aIt happens may not even every 2 months.    It gets better when she twists it.     Currently in Pain? Yes                         [facility] Adult PT Treatment/Exercise - 03/16/15 1644    Lumbar Exercises: Stretches   Pelvic Tilt --  10  X 5 reps    Lumbar Exercises: Supine   Clam 10 reps  cues for breathing.   Heel Slides --  5 reps. double  X4, single X3.  Knees more painful with flex   Lumbar Exercises: Prone   Other Prone Lumbar Exercises multifitus lumbar with 2 pillows  each side  also with knee flexion monitored for technique, cues intermittantly.   Moist Heat Therapy   Number Minutes Moist Heat 15 Minutes   Moist Heat Location Lumbar Spine   Cryotherapy   Number Minutes Cryotherapy 13 Minutes   Cryotherapy Location Knee  both   Type of Cryotherapy --  cold pack   Electrical Stimulation   Electrical Stimulation Location low back   Electrical Stimulation Action IFC   Electrical Stimulation Parameters 15 minutes   Electrical Stimulation Goals Pain   Manual Therapy   Manual therapy comments Rock tool used paraspinals for pain relief  . Patient prone.                    PT Short Term Goals - 03/16/15 1727    PT SHORT TERM GOAL #1   Title pt will be  I with basice HEP (03/10/2015)   Time 3   Period Weeks   Status Achieved   PT SHORT TERM GOAL #2   Title pt will be bale to verbalize and demonstate tehcniques to reduce low back pain via postural awareness, lifting and carrying mechanics, and HEP (03/10/2015)   Time 3   Period Weeks   Status Achieved           PT Long Term Goals - 03/16/15 1728    PT LONG TERM GOAL #1   Title upon discharge pt will be I with all HEP given throughout therapy (03/31/2015)   Time 6   Period Weeks   Status On-going   PT LONG TERM GOAL #2   Title She will be able to navigate >10 steps and walk or stand for > 30 minutes with < 2/10 pain to assist with walking 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 (03/31/2015)   Baseline 7/10 walking on steps   Time 6   Period Weeks   Status On-going   PT LONG TERM GOAL #3   Title pt will increase her lumbar mobility by > 10 degrees in all planes to help with ADLs (03/31/2015)   Time 6   Period Weeks   Status Unable to assess   PT  LONG TERM GOAL #4   Time 6   Period Weeks   Status On-going   PT LONG TERM GOAL #5   Title She will increase her FOTO score to > 53 to demonstrate improved functional capacity upon discharge (03/31/2015)   Time 6   Period Weeks   Status Unable to assess               Plan - 03/16/15 1724    Clinical Impression Statement Stephanie Miles gets 20 minutes of intense exercises on bike 3 X a week 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  Which is a good start with exercise land based.  She is able to recruit multifitus easier today lumbar area.  No new goals met.   PT Next Visit Plan Continue Lumbar mobiliity Tra. multifidus training. Review 4 way Hip and pelvic tilt with clam .  Patient wants DN  and has scheduled accordingly.   Consulted and Agree with Plan of Care Patient        Problem List Patient Active Problem List   Diagnosis Date Noted  . Aortic regurgitation due to bicuspid aortic valve 03/12/2014  . Bilateral lower extremity edema 03/12/2014  . Personal history of tobacco use, presenting hazards to health 01/20/2014  . Anxiety state, unspecified 10/16/2013  . Ileus 07/08/2013  . Migraine 07/08/2013  . Sleep apnea 07/08/2013  . UTI (urinary tract infection) 07/08/2013  . IBS (irritable bowel syndrome) 06/06/2013  . GERD (gastroesophageal reflux disease) 06/06/2013  . HTN (hypertension) 06/06/2013    Ericia Moxley 03/16/2015, 5:30 PM  [facility] [address] [place], [place], [address] Phone: [phone]   Fax:  [phone]     Melvenia Needles, PTA 03/16/2015 5:30 PM Phone: [phone] Fax: [phone]

## 2015-03-18 ENCOUNTER — Ambulatory Visit: Payer: No Typology Code available for payment source | Admitting: Physical Therapy

## 2015-03-18 DIAGNOSIS — M25561 Pain in right knee: Secondary | ICD-10-CM

## 2015-03-18 DIAGNOSIS — M545 Low back pain, unspecified: Secondary | ICD-10-CM

## 2015-03-18 DIAGNOSIS — M2560 Stiffness of unspecified joint, not elsewhere classified: Secondary | ICD-10-CM

## 2015-03-18 DIAGNOSIS — M62830 Muscle spasm of back: Secondary | ICD-10-CM

## 2015-03-18 DIAGNOSIS — M25562 Pain in left knee: Secondary | ICD-10-CM

## 2015-03-18 DIAGNOSIS — R293 Abnormal posture: Secondary | ICD-10-CM

## 2015-03-18 NOTE — Therapy (Signed)
[place], [place], [address] Phone: [phone]   Fax:  [phone]  Physical Therapy Treatment  Patient Details  Name: Stephanie Miles MRN: [id number] Date of Birth: 01/17/1981 Referring Provider:  Lance Bosch, NP  Encounter Date: 03/18/2015      PT End of Session - 03/18/15 1747    Visit Number 6   Number of Visits 12   Date for PT Re-Evaluation 03/31/15   PT Start Time 7673   PT Stop Time 1645   PT Time Calculation (min) 60 min   Activity Tolerance Patient tolerated treatment well   Behavior During Therapy [facility] for tasks assessed/performed      Past Medical History  Diagnosis Date  . Hypertension   . GERD (gastroesophageal reflux disease)   . IBS (irritable bowel syndrome)   . Migraine headache   . S/P appy 05/2013  . Anxiety   . Allergy   . Depression   . Heart murmur   . Chronic kidney disease     kidney stones  . Asthma   . Aortic regurgitation due to bicuspid aortic valve     Past Surgical History  Procedure Laterality Date  . Dental surgery    . Laparoscopic appendectomy N/A 06/06/2013    Procedure: APPENDECTOMY LAPAROSCOPIC;  Surgeon: Imogene Burn. Georgette Dover, MD;  Location: [place];  Service: General;  Laterality: N/A;  . Appendectomy      There were no vitals filed for this visit.  Visit Diagnosis:  Midline low back pain without sciatica  Joint stiffness of spine  Muscle spasm of back  Abnormal posture  Bilateral knee pain      Subjective Assessment - 03/18/15 1742    Subjective I've been riding biking Mon and Wed 10 minutes 2 x a day an all-out sprint to get to classes and haven't had much back problems.    Pain Score 6    Pain Location Knee   Pain Orientation Right;Left   Pain Descriptors / Indicators Aching   Pain Type Chronic pain   Pain Onset More than a month ago   Pain Frequency Intermittent   Aggravating Factors  bending the knee   Pain Relieving Factors  resting   Pain Score 5   Pain Location Back   Pain Orientation Right;Left;Lower   Pain Descriptors / Indicators Aching   Pain Type Chronic pain   Pain Onset More than a month ago   Pain Frequency Constant   Aggravating Factors  bending and twisting   Pain Relieving Factors resting                          [facility] Adult PT Treatment/Exercise - 03/18/15 1743    Self-Care   Other Self-Care Comments  educating on how to palpate for the transverse abdominis and assess for proper activiation. Also educated on roll TA has with supporting core/spine   Lumbar Exercises: Stretches   Passive Hamstring Stretch 2 reps;30 seconds   Single Knee to Chest Stretch 2 reps;30 seconds   Lower Trunk Rotation 4 reps;30 seconds   Piriformis Stretch 2 reps;30 seconds  pt reported this felt good on her low back   Lumbar Exercises: Aerobic   Stationary Bike NuStep L 4 x 6 min   Lumbar Exercises: Supine   Clam 10 reps   Bent Knee Raise 10 reps;1 second  x 2 sets, VC for palapation of TA for proper contraction   Lumbar  Exercises: Prone   Other Prone Lumbar Exercises multifitus lumbar with 2 pillows  each side  also with knee flexion monitored for technique, cues intermittantly.   Moist Heat Therapy   Number Minutes Moist Heat 15 Minutes   Moist Heat Location Lumbar Spine  in spine   Cryotherapy   Number Minutes Cryotherapy 15 Minutes   Cryotherapy Location Knee   Type of Cryotherapy Ice pack  bil in supine   Electrical Stimulation   Electrical Stimulation Location low back   Electrical Stimulation Action IFC   Electrical Stimulation Parameters 15 minutes, 100% scan, intensity 15   Electrical Stimulation Goals Pain   Manual Therapy   Manual Therapy Myofascial release   Manual therapy comments Rock tool used paraspinals for pain relief  . Patient prone.     Myofascial Release trigger point release of bil paraspinals 2 x bil                 PT Education - 03/18/15 1747     Education provided Yes   Education Details proper TA activation and how to palpate for activation   Person(s) Educated Patient   Methods Explanation   Comprehension Verbalized understanding          PT Short Term Goals - 03/16/15 1727    PT SHORT TERM GOAL #1   Title pt will be I with basice HEP (03/10/2015)   Time 3   Period Weeks   Status Achieved   PT SHORT TERM GOAL #2   Title pt will be bale to verbalize and demonstate tehcniques to reduce low back pain via postural awareness, lifting and carrying mechanics, and HEP (03/10/2015)   Time 3   Period Weeks   Status Achieved           PT Long Term Goals - 03/16/15 1728    PT LONG TERM GOAL #1   Title upon discharge pt will be I with all HEP given throughout therapy (03/31/2015)   Time 6   Period Weeks   Status On-going   PT LONG TERM GOAL #2   Title She will be able to navigate >10 steps and walk or stand for > 30 minutes with < 2/10 pain to assist with walking 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 (03/31/2015)   Baseline 7/10 walking on steps   Time 6   Period Weeks   Status On-going   PT LONG TERM GOAL #3   Title pt will increase her lumbar mobility by > 10 degrees in all planes to help with ADLs (03/31/2015)   Time 6   Period Weeks   Status Unable to assess   PT LONG TERM GOAL #4   Time 6   Period Weeks   Status On-going   PT LONG TERM GOAL #5   Title She will increase her FOTO score to > 53 to demonstrate improved functional capacity upon discharge (03/31/2015)   Time 6   Period Weeks   Status Unable to assess               Plan - 03/18/15 1748    Clinical Impression Statement Mekia reports that her back is doing better, but her knees are feeling worse today. She wsa able to finish all exercises with only compliant of mild soreness in her low back . she reported pain relief following STM and trigger point release.  She continues to requird education on how to activate multifidus/transverse abdominus.    PT Next  Visit Plan Continue Lumbar mobiliity Tra. multifidus training. Review 4 way Hip and pelvic tilt with clam, dry needling.    Consulted and Agree with Plan of Care Patient        Problem List Patient Active Problem List   Diagnosis Date Noted  . Aortic regurgitation due to bicuspid aortic valve 03/12/2014  . Bilateral lower extremity edema 03/12/2014  . Personal history of tobacco use, presenting hazards to health 01/20/2014  . Anxiety state, unspecified 10/16/2013  . Ileus 07/08/2013  . Migraine 07/08/2013  . Sleep apnea 07/08/2013  . UTI (urinary tract infection) 07/08/2013  . IBS (irritable bowel syndrome) 06/06/2013  . GERD (gastroesophageal reflux disease) 06/06/2013  . HTN (hypertension) 06/06/2013   Starr Lake PT, DPT, LAT, ATC  03/18/2015  5:51 PM      [facility] [address] [place], [place], [address] Phone: [phone]   Fax:  [phone]

## 2015-03-23 ENCOUNTER — Ambulatory Visit: Payer: No Typology Code available for payment source | Admitting: Physical Therapy

## 2015-03-23 ENCOUNTER — Encounter: Payer: No Typology Code available for payment source | Admitting: Physical Therapy

## 2015-03-23 DIAGNOSIS — M545 Low back pain, unspecified: Secondary | ICD-10-CM

## 2015-03-23 DIAGNOSIS — M62830 Muscle spasm of back: Secondary | ICD-10-CM

## 2015-03-23 DIAGNOSIS — M25561 Pain in right knee: Secondary | ICD-10-CM

## 2015-03-23 DIAGNOSIS — M25562 Pain in left knee: Secondary | ICD-10-CM

## 2015-03-23 DIAGNOSIS — M2560 Stiffness of unspecified joint, not elsewhere classified: Secondary | ICD-10-CM

## 2015-03-23 DIAGNOSIS — R293 Abnormal posture: Secondary | ICD-10-CM

## 2015-03-23 NOTE — Patient Instructions (Signed)
Trigger Point Dry Needling  . What is Trigger Point Dry Needling (DN)? o DN is a physical therapy technique used to treat muscle pain and dysfunction. Specifically, DN helps deactivate muscle trigger points (muscle knots).  o A thin filiform needle is used to penetrate the skin and stimulate the underlying trigger point. The goal is for a local twitch response (LTR) to occur and for the trigger point to relax. No medication of any kind is injected during the procedure.   . What Does Trigger Point Dry Needling Feel Like?  o The procedure feels different for each individual patient. Some patients report that they do not actually feel the needle enter the skin and overall the process is not painful. Very mild bleeding may occur. However, many patients feel a deep cramping in the muscle in which the needle was inserted. This is the local twitch response.   Marland Kitchen How Will I feel after the treatment? o Soreness is normal, and the onset of soreness may not occur for a few hours. Typically this soreness does not last longer than two days.  o Bruising is uncommon, however; ice can be used to decrease any possible bruising.  o In rare cases feeling tired or nauseous after the treatment is normal. In addition, your symptoms may get worse before they get better, this period will typically not last longer than 24 hours.   . What Can I do After My Treatment? o Increase your hydration by drinking more water for the next 24 hours. o You may place ice or heat on the areas treated that have become sore, however, do not use heat on inflamed or bruised areas. Heat often brings more relief post needling. o You can continue your regular activities, but vigorous activity is not recommended initially after the treatment for 24 hours. DN is best combined with other physical therapy such as strengthening, stretching, and other therapies.   .midbackBACK: Child's Pose (Sciatica)   Sit in knee-chest position and reach arms  forward. Separate knees for comfort. Hold position for 3-5_ breaths. Repeat 2-3_ times. Do _1-2__ times per day.  You may also bring your hands to the right and repeat for more left sided Quadratus lumborum stretch and hands to the left (Vice Versa)  Copyright  VHI. All rights reserved.   Voncille Lo, PT 03/23/2015 2:26 PM Phone: [phone] Fax: [phone]

## 2015-03-24 NOTE — Therapy (Signed)
[place], [place], [address] Phone: [phone]   Fax:  [phone]  Physical Therapy Treatment  Patient Details  Name: Stephanie Miles MRN: [id number] Date of Birth: May 27, 1981 Referring Provider:  Lance Bosch, NP  Encounter Date: 03/23/2015      PT End of Session - 03/23/15 1044    Visit Number 7   Number of Visits 12   Date for PT Re-Evaluation 03/31/15   PT Start Time 6962   PT Stop Time 1520   PT Time Calculation (min) 57 min   Activity Tolerance Patient tolerated treatment well   Behavior During Therapy [facility] for tasks assessed/performed      Past Medical History  Diagnosis Date  . Hypertension   . GERD (gastroesophageal reflux disease)   . IBS (irritable bowel syndrome)   . Migraine headache   . S/P appy 05/2013  . Anxiety   . Allergy   . Depression   . Heart murmur   . Chronic kidney disease     kidney stones  . Asthma   . Aortic regurgitation due to bicuspid aortic valve     Past Surgical History  Procedure Laterality Date  . Dental surgery    . Laparoscopic appendectomy N/A 06/06/2013    Procedure: APPENDECTOMY LAPAROSCOPIC;  Surgeon: Imogene Burn. Georgette Dover, MD;  Location: [place];  Service: General;  Laterality: N/A;  . Appendectomy      There were no vitals filed for this visit.  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 Yes   Pain Score 7    Pain Location Knee  Bil  knees   Pain Orientation Right;Left   Pain Descriptors / Indicators Aching   Pain Type Chronic pain   Pain Onset More than a month ago   Pain Frequency Intermittent   Aggravating Factors  bend knee when standing or sitting   Pain Score 6   Pain Location Back   Pain Orientation Right;Left;Lower  bil QL   Pain Descriptors / Indicators Aching   Pain Type Chronic pain   Pain Onset More than a month ago   Pain Frequency Constant   Aggravating Factors  staying in one positition to long or trying to lift anything                         [facility] Adult PT Treatment/Exercise - 03/23/15 1530    Moist Heat Therapy   Number Minutes Moist Heat 15 Minutes   Moist Heat Location Lumbar Spine  low back and quadratus lumborum   Electrical Stimulation   Electrical Stimulation Location low back   Electrical Stimulation Action IFC   Electrical Stimulation Parameters 15 min   Electrical Stimulation Goals Pain   Manual Therapy   Manual Therapy Myofascial release   Manual therapy comments IASTYM tool to paraspinals   Myofascial Release MFR quadratus lumborum bilaterally          Trigger Point  Dry Needling - 03/23/15 1432    Consent Given? Yes   Education Handout Provided Yes   Muscles Treated Lower Body Gluteus maximus  Quadratus Lumborum bilaterally and Paraspinals L-2 to L-5Bil   Gluteus Maximus Response --  paraspinals with twitch response and palpable lengthening.     palpable lengthening of quadratus lumborum left greater than right         PT Education - 03/23/15 1428    Education provided Yes   Education Details precautians and aftercare for dry needling and child pose forward and side to side   Person(s) Educated Patient   Methods Explanation;Demonstration;Verbal cues;Handout   Comprehension Verbalized understanding;Returned demonstration          PT Short Term Goals - 03/16/15 1727    PT SHORT TERM GOAL #1   Title pt will be I with basice HEP  (03/10/2015)   Time 3   Period Weeks   Status Achieved   PT SHORT TERM GOAL #2   Title pt will be bale to verbalize and demonstate tehcniques to reduce low back pain via postural awareness, lifting and carrying mechanics, and HEP (03/10/2015)   Time 3   Period Weeks   Status Achieved           PT Long Term Goals - 03/16/15 1728    PT LONG TERM GOAL #1   Title upon discharge pt will be I with all HEP given throughout therapy (03/31/2015)   Time 6   Period Weeks   Status On-going   PT LONG TERM GOAL #2   Title She will be able to navigate >10 steps and walk or stand for > 30 minutes with < 2/10 pain to assist with walking 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 (03/31/2015)   Baseline 7/10 walking on steps   Time 6   Period Weeks   Status On-going   PT LONG TERM GOAL #3   Title pt will increase her lumbar mobility by > 10 degrees in all planes to help with ADLs (03/31/2015)   Time 6   Period Weeks   Status Unable to assess   PT LONG TERM GOAL #4   Time 6   Period Weeks   Status On-going   PT LONG TERM GOAL #5   Title She will increase her FOTO score to > 53 to demonstrate improved functional capacity upon discharge (03/31/2015)   Time 6   Period Weeks   Status Unable to assess               Plan - 03/23/15 1330    Clinical Impression Statement Pt tolerated Trigger point dry needling well and had palpable muscle lengthening of paraspinals and quadratus lumborum.  Will assess benefti of TDN and relief of pain.  Pt stated she had imporvement of pain but did not report scaled  number.    Pt will benefit from skilled therapeutic intervention in order to improve on the following deficits Pain;Improper body mechanics;Postural dysfunction;Decreased mobility;Decreased activity tolerance;Decreased endurance;Decreased range of motion;Decreased strength;Hypomobility;Decreased balance   Rehab Potential Good   PT Frequency 2x / week   PT Duration 6 weeks   PT  Treatment/Interventions ADLs/Self Care Home Management;Cryotherapy;Electrical Stimulation;Iontophoresis 22m/ml Dexamethasone;Moist Heat;Ultrasound;Gait training;Therapeutic activities;Therapeutic exercise;Neuromuscular re-education;Manual techniques;Passive range of motion;Dry needling;Taping;Patient/family education   PT Next Visit Plan  Assess dry needling  and goals next visitContinue Lumbar mobiliity Tra. multifidus training. Review 4 way Hip and pelvic tilt with clam, dry needling.    Consulted and  Agree with Plan of Care Patient        Problem List Patient Active Problem List   Diagnosis Date Noted  . Aortic regurgitation due to bicuspid aortic valve 03/12/2014  . Bilateral lower extremity edema 03/12/2014  . Personal history of tobacco use, presenting hazards to health 01/20/2014  . Anxiety state, unspecified 10/16/2013  . Ileus 07/08/2013  . Migraine 07/08/2013  . Sleep apnea 07/08/2013  . UTI (urinary tract infection) 07/08/2013  . IBS (irritable bowel syndrome) 06/06/2013  . GERD (gastroesophageal reflux disease) 06/06/2013  . HTN (hypertension) 06/06/2013   Voncille Lo, PT 03/24/2015 11:07 AM Phone: [phone] Fax: [place], [place], [address] Phone: [phone]   Fax:  [phone]

## 2015-03-25 ENCOUNTER — Ambulatory Visit: Payer: No Typology Code available for payment source | Admitting: Physical Therapy

## 2015-03-25 DIAGNOSIS — M25562 Pain in left knee: Secondary | ICD-10-CM

## 2015-03-25 DIAGNOSIS — M62830 Muscle spasm of back: Secondary | ICD-10-CM

## 2015-03-25 DIAGNOSIS — M545 Low back pain, unspecified: Secondary | ICD-10-CM

## 2015-03-25 DIAGNOSIS — R293 Abnormal posture: Secondary | ICD-10-CM

## 2015-03-25 DIAGNOSIS — M2560 Stiffness of unspecified joint, not elsewhere classified: Secondary | ICD-10-CM

## 2015-03-25 DIAGNOSIS — M25561 Pain in right knee: Secondary | ICD-10-CM

## 2015-03-25 NOTE — Therapy (Signed)
[place], [place], [address] Phone: [phone]   Fax:  [phone]  Physical Therapy Treatment  Patient Details  Name: Stephanie Miles MRN: [id number] Date of Birth: 07-19-1981 Referring Provider:  Lance Bosch, NP  Encounter Date: 03/25/2015      PT End of Session - 03/25/15 1735    Visit Number 8   Number of Visits 12   Date for PT Re-Evaluation 03/31/15   PT Start Time 3329   PT Stop Time 1645   PT Time Calculation (min) 60 min   Activity Tolerance Patient tolerated treatment well   Behavior During Therapy [facility] for tasks assessed/performed      Past Medical History  Diagnosis Date  . Hypertension   . GERD (gastroesophageal reflux disease)   . IBS (irritable bowel syndrome)   . Migraine headache   . S/P appy 05/2013  . Anxiety   . Allergy   . Depression   . Heart murmur   . Chronic kidney disease     kidney stones  . Asthma   . Aortic regurgitation due to bicuspid aortic valve     Past Surgical History  Procedure Laterality Date  . Dental surgery    . Laparoscopic appendectomy N/A 06/06/2013    Procedure: APPENDECTOMY LAPAROSCOPIC;  Surgeon: Imogene Burn. Georgette Dover, MD;  Location: [place];  Service: General;  Laterality: N/A;  . Appendectomy      There were no vitals filed for this visit.  Visit Diagnosis:  Midline low back pain without sciatica  Joint stiffness of spine  Muscle spasm of back  Abnormal posture  Bilateral knee pain      Subjective Assessment - 03/25/15 1557    Subjective " my back is feeling better today but i am having more pain in both of my knees"   Currently in Pain? Yes   Pain Score 6    Pain Location Knee   Pain Orientation Right;Left   Pain Type Chronic pain   Pain Onset More than a month ago   Pain Frequency Intermittent   Pain Score 3   Pain Location Back   Pain Orientation Right;Left;Lower   Pain Descriptors / Indicators Aching   Pain Type  Chronic pain   Pain Onset More than a month ago   Pain Frequency Constant                         [facility] Adult PT Treatment/Exercise - 03/25/15 1603    Lumbar Exercises: Stretches   Passive Hamstring Stretch 2 reps;30 seconds   Single Knee to Chest Stretch 2 reps;30 seconds   Lower Trunk Rotation 4 reps;30 seconds   Pelvic Tilt 5 reps;10 seconds   Lumbar Exercises: Aerobic   Stationary Bike NuStep L 7 x 7 min   Lumbar Exercises: Supine   Clam 10 reps   Bent Knee Raise 10 reps   Bent Knee Raise Limitations attempted to perform but halted exercise because she reported pain and cramping  the stomach.   Moist Heat Therapy   Number Minutes Moist Heat 15 Minutes   Moist Heat Location Lumbar Spine   Cryotherapy   Number Minutes Cryotherapy 15 Minutes   Cryotherapy Location Knee  bil   Type of Cryotherapy Ice pack   Electrical Stimulation   Electrical Stimulation Location low back   Electrical Stimulation Action IFC   Electrical Stimulation Parameters 15   Electrical Stimulation Goals Pain   Manual  Therapy   Manual therapy comments instrument assisted STM over the bil patellar tendons, and bil lumbar paraspinals                  PT Short Term Goals - 03/16/15 1727    PT SHORT TERM GOAL #1   Title pt will be I with basice HEP (03/10/2015)   Time 3   Period Weeks   Status Achieved   PT SHORT TERM GOAL #2   Title pt will be bale to verbalize and demonstate tehcniques to reduce low back pain via postural awareness, lifting and carrying mechanics, and HEP (03/10/2015)   Time 3   Period Weeks   Status Achieved           PT Long Term Goals - 03/16/15 1728    PT LONG TERM GOAL #1   Title upon discharge pt will be I with all HEP given throughout therapy (03/31/2015)   Time 6   Period Weeks   Status On-going   PT LONG TERM GOAL #2   Title She will be able to navigate >10 steps and walk or stand for > 30 minutes with < 2/10 pain to assist with walking and  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 classes on time (03/31/2015)   Baseline 7/10 walking on steps   Time 6   Period Weeks   Status On-going   PT LONG TERM GOAL #3   Title pt will increase her lumbar mobility by > 10 degrees in all planes to help with ADLs (03/31/2015)   Time 6   Period Weeks   Status Unable to assess   PT LONG TERM GOAL #4   Time 6   Period Weeks   Status On-going   PT LONG TERM GOAL #5   Title She will increase her FOTO score to > 53 to demonstrate improved functional capacity upon discharge (03/31/2015)   Time 6   Period Weeks   Status Unable to assess               Problem List Patient Active Problem List   Diagnosis Date Noted  . Aortic regurgitation due to bicuspid aortic valve 03/12/2014  . Bilateral lower extremity edema 03/12/2014  . Personal history of tobacco use, presenting hazards to health 01/20/2014  . Anxiety state, unspecified 10/16/2013  . Ileus 07/08/2013  . Migraine 07/08/2013  . Sleep apnea 07/08/2013  . UTI (urinary tract infection) 07/08/2013  . IBS (irritable bowel syndrome) 06/06/2013  . GERD (gastroesophageal reflux disease) 06/06/2013  . HTN (hypertension) 06/06/2013   Starr Lake PT, DPT, LAT, ATC  03/25/2015  5:36 PM      [facility] [address] [place], [place], [address] Phone: [phone]   Fax:  [phone]

## 2015-04-01 ENCOUNTER — Ambulatory Visit
Payer: No Typology Code available for payment source | Attending: Physical Medicine and Rehabilitation | Admitting: Physical Therapy

## 2015-04-01 DIAGNOSIS — R293 Abnormal posture: Secondary | ICD-10-CM | POA: Insufficient documentation

## 2015-04-01 DIAGNOSIS — M25561 Pain in right knee: Secondary | ICD-10-CM | POA: Insufficient documentation

## 2015-04-01 DIAGNOSIS — M545 Low back pain, unspecified: Secondary | ICD-10-CM

## 2015-04-01 DIAGNOSIS — M25562 Pain in left knee: Secondary | ICD-10-CM | POA: Insufficient documentation

## 2015-04-01 DIAGNOSIS — M2560 Stiffness of unspecified joint, not elsewhere classified: Secondary | ICD-10-CM | POA: Insufficient documentation

## 2015-04-01 DIAGNOSIS — M62830 Muscle spasm of back: Secondary | ICD-10-CM | POA: Insufficient documentation

## 2015-04-01 NOTE — Therapy (Addendum)
[place], [place], [address] Phone: [phone]   Fax:  [phone]  Physical Therapy Treatment / Re-certification  Patient Details  Name: Stephanie Miles MRN: [id number] Date of Birth: 16-Jul-1981 Referring Provider:  Lance Bosch, NP  Encounter Date: 04/01/2015      PT End of Session - 04/01/15 1740    Visit Number 9   Number of Visits 17   Date for PT Re-Evaluation 04/29/15   PT Start Time 1620   PT Stop Time 1735   PT Time Calculation (min) 75 min   Activity Tolerance Patient tolerated treatment well   Behavior During Therapy [facility] for tasks assessed/performed      Past Medical History  Diagnosis Date  . Hypertension   . GERD (gastroesophageal reflux disease)   . IBS (irritable bowel syndrome)   . Migraine headache   . S/P appy 05/2013  . Anxiety   . Allergy   . Depression   . Heart murmur   . Chronic kidney disease     kidney stones  . Asthma   . Aortic regurgitation due to bicuspid aortic valve     Past Surgical History  Procedure Laterality Date  . Dental surgery    . Laparoscopic appendectomy N/A 06/06/2013    Procedure: APPENDECTOMY LAPAROSCOPIC;  Surgeon: Imogene Burn. Georgette Dover, MD;  Location: [place];  Service: General;  Laterality: N/A;  . Appendectomy      There were no vitals filed for this visit.  Visit Diagnosis:  Midline low back pain without sciatica - Plan: PT plan of care cert/re-cert  Joint stiffness of spine - Plan: PT plan of care cert/re-cert  Muscle spasm of back - Plan: PT plan of care cert/re-cert  Bilateral knee pain - Plan: PT plan of care cert/re-cert  Abnormal posture - Plan: PT plan of care cert/re-cert      Subjective Assessment - 04/01/15 1637    Subjective "My back feels like it has been getting better and I have been trying to use heat more and stretch more" She continues to report increased pain inthe knees.    Currently in Pain? Yes   Pain Score  7    Pain Location Knee   Pain Orientation Right;Left   Pain Descriptors / Indicators Aching   Pain Type Chronic pain   Pain Onset More than a month ago   Pain Frequency Intermittent   Pain Score 4   Pain Location Back   Pain Orientation Right;Left;Lower   Pain Descriptors / Indicators Aching   Pain Type Chronic pain   Pain Onset More than a month ago   Pain Frequency Constant   Aggravating Factors  staying in one postion,             [facility] PT Assessment - 04/01/15 0001    Assessment   Medical Diagnosis low back pain and bil knee pain   Onset Date/Surgical Date --  low back June 2015, bil knees for many years   Hand Dominance Right   Next MD Visit PRN   Prior Therapy no   Precautions   Precautions None   Restrictions   Weight Bearing Restrictions No   Balance Screen   Has the patient fallen in the past 6 months Yes   How many times? 2   Has the patient had a decrease in activity level because of a fear of falling?  Yes   Is the patient reluctant to leave their home because of  a fear of falling?  No   Home Environment   Living Environment Private residence   Living Arrangements Alone   Type of [place] to enter   Entrance Stairs-Number of Steps 14   Entrance Stairs-Rails Right   Home Layout One level   Home Equipment Crutches   Prior Function   Level of Independence Independent;Independent with basic ADLs   Vocation Student   Vocation Requirements prolong sitting, walking to other classes   Leisure going to the pool, backpacking,    Cognition   Overall Cognitive Status Within Functional Limits for tasks assessed   AROM   Right Knee Extension 0   Right Knee Flexion 135   Left Knee Extension 0   Left Knee Flexion 132  pain at endrange   Lumbar Flexion 56   Lumbar Extension 14   Lumbar - Right Side Bend 35   Lumbar - Left Side Bend 36   Lumbar - Right Rotation 30%   Lumbar - Left Rotation 30%   Strength   Right Hip Flexion 4/5    Right Hip Extension 4/5   Right Hip ABduction 4+/5   Right Hip ADduction 4/5   Left Hip Flexion 4/5   Left Hip Extension 4-/5   Left Hip ABduction 4/5   Left Hip ADduction 4-/5   Right Knee Flexion 4+/5   Right Knee Extension 4+/5   Left Knee Flexion 4+/5   Left Knee Extension 4+/5                     [facility] Adult PT Treatment/Exercise - 04/01/15 1640    Self-Care   Self-Care Other Self-Care Comments   Other Self-Care Comments  mechanics of the femur and patella and what can cause patellofemoral pain. Educated on McConnel taping and benefits of tape and where she can purchase it.    Lumbar Exercises: Stretches   Passive Hamstring Stretch 2 reps;30 seconds   Single Knee to Chest Stretch 2 reps;30 seconds   Lower Trunk Rotation 4 reps;30 seconds   Pelvic Tilt 5 reps;10 seconds   Lumbar Exercises: Aerobic   Stationary Bike NuStep L 5 x 10 min   Knee/Hip Exercises: Supine   Straight Leg Raise with External Rotation AROM;Strengthening;Right;2 sets;10 reps   Moist Heat Therapy   Number Minutes Moist Heat 15 Minutes   Moist Heat Location Lumbar Spine  in supine   Cryotherapy   Number Minutes Cryotherapy 15 Minutes   Cryotherapy Location Knee   Type of Cryotherapy Ice pack   Electrical Stimulation   Electrical Stimulation Location low back   Electrical Stimulation Action IFC   Electrical Stimulation Parameters 15 min, 100% scan   Electrical Stimulation Goals Pain   Manual Therapy   Manual Therapy Soft tissue mobilization;Taping   Soft tissue mobilization instrument assisted STM over vastus lateralus   Myofascial Release Manual trigger point release of the vastus lateralis   McConnell for R knee lateral to medial  reported significant relief                PT Education - 04/01/15 1740    Education provided Yes   Education Details updated HEP, McConnell taping education   Person(s) Educated Patient   Methods Explanation   Comprehension Verbalized  understanding          PT Short Term Goals - 03/16/15 1727    PT SHORT TERM GOAL #1   Title pt will be I with basice  HEP (03/10/2015)   Time 3   Period Weeks   Status Achieved   PT SHORT TERM GOAL #2   Title pt will be bale to verbalize and demonstate tehcniques to reduce low back pain via postural awareness, lifting and carrying mechanics, and HEP (03/10/2015)   Time 3   Period Weeks   Status Achieved           PT Long Term Goals - 04/01/15 1744    PT LONG TERM GOAL #1   Title upon discharge pt will be I with all HEP given throughout therapy (03/31/2015)   Time 6   Period Weeks   Status On-going   PT LONG TERM GOAL #2   Title She will be able to navigate >10 steps and walk or stand for > 30 minutes with < 2/10 pain to assist with walking 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 (03/31/2015)   Baseline 7/10 walking on steps   Time 6   Period Weeks   Status On-going   PT LONG TERM GOAL #3   Title pt will increase her lumbar mobility by > 10 degrees in all planes to help with ADLs (03/31/2015)   Time 6   Period Weeks   Status On-going   PT LONG TERM GOAL #4   Title She will demonstrate no lumbar or hip muscular spasm to assist with standing walking endurance and trunk mobility (03/31/2015)   Time 6   Period Weeks   Status On-going   PT LONG TERM GOAL #5   Title She will increase her FOTO score to > 53 to demonstrate improved functional capacity upon discharge (03/31/2015)   Time 6   Period Weeks   Status Unable to assess               Plan - 04/01/15 1741    Clinical Impression Statement Emslee has made some progress with increased trunk mobility and R knee AROM. She continues to demonstrate increased pain and in bil knees and the low back with the knees causing more pain. she was able to perform all exercises well today without complaint of increased pain in the knee. Educated on [employer] taping which pt wanted to try and reported signifcant relief with the tape  with walking and going up/down steps. She would benefit from continued physical therapy to progress toward her goals.    Pt will benefit from skilled therapeutic intervention in order to improve on the following deficits Pain;Improper body mechanics;Postural dysfunction;Decreased mobility;Decreased activity tolerance;Decreased endurance;Decreased range of motion;Decreased strength;Hypomobility;Decreased balance   Rehab Potential Good   PT Frequency 2x / week   PT Duration 4 weeks   PT Treatment/Interventions ADLs/Self Care Home Management;Cryotherapy;Electrical Stimulation;Iontophoresis 69m/ml Dexamethasone;Moist Heat;Ultrasound;Gait training;Therapeutic activities;Therapeutic exercise;Neuromuscular re-education;Manual techniques;Passive range of motion;Dry needling;Taping;Patient/family education   PT Next Visit Plan Assess response to McConnel taping, Continue Lumbar mobiliity Tra. multifidus training. Review 4 way Hip and pelvic tilt with clam, dry needling. FOTO   PT Home Exercise Plan SLR with ER   Consulted and Agree with Plan of Care Patient        Problem List Patient Active Problem List   Diagnosis Date Noted  . Aortic regurgitation due to bicuspid aortic valve 03/12/2014  . Bilateral lower extremity edema 03/12/2014  . Personal history of tobacco use, presenting hazards to health 01/20/2014  . Anxiety state, unspecified 10/16/2013  . Ileus 07/08/2013  . Migraine 07/08/2013  . Sleep apnea 07/08/2013  . UTI (urinary tract infection) 07/08/2013  .  IBS (irritable bowel syndrome) 06/06/2013  . GERD (gastroesophageal reflux disease) 06/06/2013  . HTN (hypertension) 06/06/2013   Starr Lake PT, DPT, LAT, ATC  04/01/2015  5:52 PM      [facility] [address] [place], [place], [address] Phone: [phone]   Fax:  [phone]

## 2015-04-01 NOTE — Patient Instructions (Signed)
   Neev Mcmains PT, DPT, LAT, ATC  [facility] Phone: [phone]     

## 2015-04-02 ENCOUNTER — Ambulatory Visit: Payer: No Typology Code available for payment source | Admitting: Physical Therapy

## 2015-04-02 ENCOUNTER — Encounter: Payer: No Typology Code available for payment source | Admitting: Physical Therapy

## 2015-04-02 DIAGNOSIS — M2560 Stiffness of unspecified joint, not elsewhere classified: Secondary | ICD-10-CM

## 2015-04-02 DIAGNOSIS — M25561 Pain in right knee: Secondary | ICD-10-CM

## 2015-04-02 DIAGNOSIS — M62830 Muscle spasm of back: Secondary | ICD-10-CM

## 2015-04-02 DIAGNOSIS — M25562 Pain in left knee: Secondary | ICD-10-CM

## 2015-04-02 DIAGNOSIS — M545 Low back pain, unspecified: Secondary | ICD-10-CM

## 2015-04-02 DIAGNOSIS — R293 Abnormal posture: Secondary | ICD-10-CM

## 2015-04-03 ENCOUNTER — Ambulatory Visit: Payer: No Typology Code available for payment source | Attending: Internal Medicine | Admitting: Internal Medicine

## 2015-04-03 ENCOUNTER — Encounter: Payer: Self-pay | Admitting: Internal Medicine

## 2015-04-03 VITALS — BP 120/80 | HR 86 | Temp 98.5°F | Resp 18 | Ht 66.0 in | Wt 240.0 lb

## 2015-04-03 DIAGNOSIS — Z87891 Personal history of nicotine dependence: Secondary | ICD-10-CM | POA: Insufficient documentation

## 2015-04-03 DIAGNOSIS — R609 Edema, unspecified: Secondary | ICD-10-CM | POA: Insufficient documentation

## 2015-04-03 DIAGNOSIS — J4530 Mild persistent asthma, uncomplicated: Secondary | ICD-10-CM | POA: Insufficient documentation

## 2015-04-03 DIAGNOSIS — I10 Essential (primary) hypertension: Secondary | ICD-10-CM | POA: Insufficient documentation

## 2015-04-03 LAB — BASIC METABOLIC PANEL
BUN: 12 mg/dL (ref 7–25)
CO2: 31 mmol/L (ref 20–31)
Calcium: 9.7 mg/dL (ref 8.6–10.2)
Chloride: 100 mmol/L (ref 98–110)
Creat: 0.86 mg/dL (ref 0.50–1.10)
Glucose, Bld: 85 mg/dL (ref 65–99)
POTASSIUM: 4.1 mmol/L (ref 3.5–5.3)
SODIUM: 142 mmol/L (ref 135–146)

## 2015-04-03 MED ORDER — LOSARTAN POTASSIUM 100 MG PO TABS: 100.0000 mg | ORAL_TABLET | Freq: Every day | ORAL | Status: DC

## 2015-04-03 MED ORDER — FUROSEMIDE 40 MG PO TABS
40.0000 mg | ORAL_TABLET | Freq: Every morning | ORAL | Status: DC
Start: 2015-04-03 — End: 2015-09-23

## 2015-04-03 MED ORDER — BECLOMETHASONE DIPROPIONATE 80 MCG/ACT IN AERS: 2.0000 | INHALATION_SPRAY | Freq: Two times a day (BID) | RESPIRATORY_TRACT | Status: DC

## 2015-04-03 NOTE — Progress Notes (Signed)
Patient ID: Stephanie Miles, female   DOB: [birth year], 34 y.o.   MRN: [id number] Subjective:  Stephanie Miles is a 34 y.o. female with hypertension,  Aortic regurgitation, IBS , anxiety. Patient is here for follow-up of her hypertension and potassium level.  Patient reports that she has had wheezing several mornings upon awakening even with daily Qvar use.  Patient reports that she is having to use her albuterol inhaler at least twice per day several days per week. Current Outpatient Prescriptions  Medication Sig Dispense Refill  . albuterol (PROVENTIL HFA;VENTOLIN HFA) 108 (90 BASE) MCG/ACT inhaler Inhale 2 puffs into the lungs every 6 (six) hours as needed for wheezing or shortness of breath. 3 Inhaler 3  . beclomethasone (QVAR) 40 MCG/ACT inhaler Inhale 2 puffs into the lungs 2 (two) times daily. 3 Inhaler 3  . cetirizine (ZYRTEC) 10 MG tablet Take 10 mg by mouth daily.    . fluticasone (FLONASE) 50 MCG/ACT nasal spray Place 2 sprays into both nostrils daily.    . furosemide (LASIX) 40 MG tablet Take 1 tablet (40 mg total) by mouth every morning. 30 tablet 3  . losartan (COZAAR) 100 MG tablet Take 1 tablet (100 mg total) by mouth daily. 30 tablet 5  . meloxicam (MOBIC) 7.5 MG tablet Take 7.5 mg by mouth daily.    . mirtazapine (REMERON) 15 MG tablet Take 15 mg by mouth at bedtime.    . norgestimate-ethinyl estradiol (ORTHO-CYCLEN,SPRINTEC,PREVIFEM) 0.25-35 MG-MCG tablet Take 1 tablet by mouth every evening. 1 Package 11  . nystatin cream (MYCOSTATIN) Apply 1 application topically 2 (two) times daily. 30 g 1  . omeprazole (PRILOSEC) 20 MG capsule TAKE 1 CAPSULE BY MOUTH TWICE DAILY 60 capsule 11  . potassium chloride (K-DUR) 10 MEQ tablet TAKE 1 TABLET BY MOUTH 2 TIMES DAILY. (Patient taking differently: Patient states she takes 1 every other day) 60 tablet 3  . Prenatal Vit-Fe Sulfate-FA (PRENATAL VITAMIN PO) Take 1 tablet by mouth every evening.     . Probiotic Product (PROBIOTIC DAILY PO) Take  500 mg by mouth daily.     . traZODone (DESYREL) 100 MG tablet Take 100 mg by mouth at bedtime.    Marland Kitchen venlafaxine (EFFEXOR) 50 MG tablet Take 50 mg by mouth 2 (two) times daily with a meal.     . dicyclomine (BENTYL) 20 MG tablet Take 1 tablet (20 mg total) by mouth 4 (four) times daily -  before meals and at bedtime. (Patient not taking: Reported on 04/03/2015) 120 tablet 11  . ketoconazole (NIZORAL) 2 % cream Apply 1 application topically 2 (two) times daily as needed for irritation. (Patient not taking: Reported on 04/03/2015) 30 g 2  . predniSONE (DELTASONE) 10 MG tablet Take 1 tablet (10 mg total) by mouth daily with breakfast. 6-5-4-3-2-1. Take 6 pills today and decrease by 1 pill per day (Patient not taking: Reported on 02/17/2015) 21 tablet 0  . [DISCONTINUED] potassium chloride (K-DUR,KLOR-CON) 10 MEQ tablet Take 1 tablet (10 mEq total) by mouth 2 (two) times daily. 30 tablet 3   No current facility-administered medications for this visit.    Hypertension ROS: taking medications as instructed, no medication side effects noted, no TIA's, no chest pain on exertion, no dyspnea on exertion and no swelling of ankles.  Positive shortness of breath , wheezing.  All other systems are negative  Objective:  BP 120/80 mmHg  Pulse 86  Temp(Src) 98.5 F (36.9 C) (Oral)  Resp 18  Ht 5' 6"  (1.676 m)  Wt 240 lb (108.863 kg)  BMI 38.76 kg/m2  SpO2 97%  Appearance alert, well appearing, and in no distress, oriented to person, place, and time and overweight. General exam BP noted to be well controlled today in office, S1, S2 normal, no gallop, no murmur, chest clear, no JVD, no HSM, no edema,  Lungs clear to auscultation no wheezing or Rales noted.  Lab review:  Will obtain BMP today.   Assessment:   Hypertension well controlled and needs to follow diet more regularly.    Asthma:   Patient's asthma is currently a persistent uncomplicated.  I will increase her dose of Qvar to 80 g to take twice per day.   If patient continues to have symptoms that are not improving I may send Pulmonology referral   Plan:   Current treatment plan is effective, no change in therapy. Reviewed diet, exercise and weight control..   Return in about 6 months (around 10/01/2015) for Hypertension PCP and 3 months lab visit.    Lance Bosch, NP 04/03/2015 4:53 PM

## 2015-04-03 NOTE — Therapy (Signed)
[place], [place], [address] Phone: [phone]   Fax:  [phone]  Physical Therapy Treatment  Patient Details  Name: Stephanie Miles MRN: [id number] Date of Birth: 1981-01-22 Referring Provider:  Lance Bosch, NP  Encounter Date: 04/02/2015      PT End of Session - 04/03/15 0807    Visit Number 10   Number of Visits 17   Date for PT Re-Evaluation 04/29/15   PT Start Time 1500   PT Stop Time 1600   PT Time Calculation (min) 60 min   Activity Tolerance Patient tolerated treatment well      Past Medical History  Diagnosis Date  . Hypertension   . GERD (gastroesophageal reflux disease)   . IBS (irritable bowel syndrome)   . Migraine headache   . S/P appy 05/2013  . Anxiety   . Allergy   . Depression   . Heart murmur   . Chronic kidney disease     kidney stones  . Asthma   . Aortic regurgitation due to bicuspid aortic valve     Past Surgical History  Procedure Laterality Date  . Dental surgery    . Laparoscopic appendectomy N/A 06/06/2013    Procedure: APPENDECTOMY LAPAROSCOPIC;  Surgeon: Imogene Burn. Georgette Dover, MD;  Location: [facility];  Service: General;  Laterality: N/A;  . Appendectomy      There were no vitals filed for this visit.  Visit Diagnosis:  Midline low back pain without sciatica  Joint stiffness of spine  Muscle spasm of back  Bilateral knee pain  Abnormal posture      Subjective Assessment - 04/02/15 1400    Subjective The back hurts a little more today than yesterday 4/10.  The tape on my knee helped.  Sore after previous dry needling but nothing too bad.     Currently in Pain? Yes   Pain Score 4    Pain Location Back   Pain Score 6   Pain Location Knee   Pain Orientation Right;Left                         [facility] Adult PT Treatment/Exercise - 04/03/15 0001    Electrical Stimulation   Electrical Stimulation Location low back   Electrical  Stimulation Action IFC   Electrical Stimulation Parameters 15 min 9 ma   Electrical Stimulation Goals Pain   Manual Therapy   Manual Therapy Soft tissue mobilization;Myofascial release   Soft tissue mobilization to lumbar paraspinals, vastus lateralis and TFL   Myofascial Release Graston technique G2,3,4  quads B especially vastus lateralis          Trigger Point Dry Needling - 04/03/15 0825    Consent Given? Yes   Muscles Treated Lower Body Quadriceps  Lumbar multifidi B   Quadriceps Response Twitch response elicited;Palpable increased muscle length  Bilaterally                PT Short Term Goals - 04/03/15 5732    PT SHORT TERM GOAL #1   Title pt will be I with basice HEP (03/10/2015)   Status Achieved   PT SHORT TERM GOAL #2   Title pt will be bale to verbalize and demonstate tehcniques to reduce low back pain via postural awareness, lifting and carrying mechanics, and HEP (03/10/2015)   Status Achieved           PT Long Term Goals - 04/03/15 2025  PT LONG TERM GOAL #1   Title upon discharge pt will be I with all HEP given throughout therapy (03/31/2015)   Time 6   Period Weeks   Status On-going   PT LONG TERM GOAL #2   Title She will be able to navigate >10 steps and walk or stand for > 30 minutes with < 2/10 pain to assist with walking 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 (03/31/2015)   Time 6   Period Weeks   Status On-going   PT LONG TERM GOAL #3   Title pt will increase her lumbar mobility by > 10 degrees in all planes to help with ADLs (03/31/2015)   Time 6   Period Weeks   Status On-going   PT LONG TERM GOAL #4   Title She will demonstrate no lumbar or hip muscular spasm to assist with standing walking endurance and trunk mobility (03/31/2015)   Time 6   Period Weeks   Status On-going   PT LONG TERM GOAL #5   Title She will increase her FOTO score to > 53 to demonstrate improved functional capacity upon discharge (03/31/2015)   Time 6   Period  Weeks   Status On-going               Plan - 04/03/15 0809    Clinical Impression Statement The patient reports her pain is a lot better in her back although still 4/10 centrally located.  Her main complaint is bilateral anterior knee pain.  Right > left tender points in vastus lateralis and TFL.  Improved muscle length following dry needling and manual therapy.  Patient declines taping today (although it helped last visit) to determine benefit of dry needling.  During the session today, the patient learned  of the death of a friend.  No further education/instruction done today as patient was distracted by this news.     PT Next Visit Plan assess response to dry needling; resume McConnell taping for knees as needed; continue abdominal and multifidi muscle recruit exercises; clam        Problem List Patient Active Problem List   Diagnosis Date Noted  . Aortic regurgitation due to bicuspid aortic valve 03/12/2014  . Bilateral lower extremity edema 03/12/2014  . Personal history of tobacco use, presenting hazards to health 01/20/2014  . Anxiety state, unspecified 10/16/2013  . Ileus 07/08/2013  . Migraine 07/08/2013  . Sleep apnea 07/08/2013  . UTI (urinary tract infection) 07/08/2013  . IBS (irritable bowel syndrome) 06/06/2013  . GERD (gastroesophageal reflux disease) 06/06/2013  . HTN (hypertension) 06/06/2013    Alvera Singh 04/03/2015, 8:27 AM  [facility] [address] [place], [place], [address] Phone: [phone]   Fax:  [phone]   Ruben Im, PT 04/03/2015 8:27 AM Phone: [phone] Fax: [phone]

## 2015-04-03 NOTE — Patient Instructions (Addendum)
You may come back in 3 months for a lab visit recheck your potassium is 6 months follow-up for hypertension with me   I have also changed her Qvar to 80 micrograms.  Go ahead and begin taking the stronger dose if you still continue to have symptoms that are not improved we may push forward with pulmonology referral

## 2015-04-03 NOTE — Progress Notes (Signed)
F/U potassium level . HTN Stated BP running around 160/90 No Hx tobacco. No Pain today  Taking medication as prescribed

## 2015-04-06 ENCOUNTER — Ambulatory Visit: Payer: No Typology Code available for payment source | Admitting: Physical Therapy

## 2015-04-06 ENCOUNTER — Encounter: Payer: No Typology Code available for payment source | Admitting: Physical Therapy

## 2015-04-06 DIAGNOSIS — M545 Low back pain, unspecified: Secondary | ICD-10-CM

## 2015-04-06 DIAGNOSIS — M62830 Muscle spasm of back: Secondary | ICD-10-CM

## 2015-04-06 DIAGNOSIS — M25561 Pain in right knee: Secondary | ICD-10-CM

## 2015-04-06 DIAGNOSIS — M25562 Pain in left knee: Secondary | ICD-10-CM

## 2015-04-06 DIAGNOSIS — R293 Abnormal posture: Secondary | ICD-10-CM

## 2015-04-06 DIAGNOSIS — M2560 Stiffness of unspecified joint, not elsewhere classified: Secondary | ICD-10-CM

## 2015-04-06 NOTE — Therapy (Signed)
[place], [place], [address] Phone: [phone]   Fax:  [phone]  Physical Therapy Treatment  Patient Details  Name: Stephanie Miles MRN: [id number] Date of Birth: [birth year] Referring Provider:  Lance Bosch, NP  Encounter Date: 04/06/2015      PT End of Session - 04/06/15 1638    Visit Number 11   Number of Visits 17   Date for PT Re-Evaluation 04/29/15   Authorization Type GCCN   PT Start Time 9373   PT Stop Time 1535   PT Time Calculation (min) 62 min   Activity Tolerance Patient tolerated treatment well   Behavior During Therapy [facility] for tasks assessed/performed      Past Medical History  Diagnosis Date  . Hypertension   . GERD (gastroesophageal reflux disease)   . IBS (irritable bowel syndrome)   . Migraine headache   . S/P appy 05/2013  . Anxiety   . Allergy   . Depression   . Heart murmur   . Chronic kidney disease     kidney stones  . Asthma   . Aortic regurgitation due to bicuspid aortic valve     Past Surgical History  Procedure Laterality Date  . Dental surgery    . Laparoscopic appendectomy N/A 06/06/2013    Procedure: APPENDECTOMY LAPAROSCOPIC;  Surgeon: Imogene Burn. Georgette Dover, MD;  Location: [place];  Service: General;  Laterality: N/A;  . Appendectomy      There were no vitals filed for this visit.  Visit Diagnosis:  Midline low back pain without sciatica  Joint stiffness of spine  Muscle spasm of back  Bilateral knee pain  Abnormal posture      Subjective Assessment - 04/06/15 1641    Subjective I am feeling worse today because of barometric pressure  . My back hasnt been hurting as bad as before.  I have been using heat and doing exercises at home and I tihink it helps  and my swimming is the best   Limitations Sitting;Lifting;Standing;Walking;House hold activities   How long can you sit comfortably? 45 - 50 min   How long can you stand comfortably? 20 min    How long can you walk comfortably? 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 Yes   Pain Score 5    Pain Location Back   Pain Orientation Right;Left   Pain Descriptors / Indicators Aching   Pain Type Chronic pain   Pain Onset More than a month ago   Pain Frequency Intermittent   Pain Score 7   Pain Location Knee   Pain Orientation Right;Left   Pain Descriptors / Indicators Aching   Pain Type Chronic pain   Pain Onset More than a month ago   Pain Frequency Constant  worsens as the day goes on            [facility] PT Assessment - 04/06/15 1729    Observation/Other Assessments   Focus on Therapeutic Outcomes (FOTO)  Intake 45% limtation 55% eval limtiation 69%  predicted 47% limitation   Palpation   Palpation comment marked tenderness Left knee > right at anterior lateral musculature above patella today.                       [facility] Adult PT Treatment/Exercise - 04/06/15  1646    Moist Heat Therapy   Number Minutes Moist Heat 15 Minutes   Moist Heat Location Lumbar Spine;Knee  heat on bil knees   Electrical Stimulation   Electrical Stimulation Location low back   Electrical Stimulation Action IFC   Electrical Stimulation Parameters 15 min to pt tolerance   Electrical Stimulation Goals Pain   Manual Therapy   Manual Therapy Soft tissue mobilization;Myofascial release   Manual therapy comments also contract relax for knee ext 5 x  for 10 sec each bil knees with palpable lengthening   Soft tissue mobilization to lumbar paraspinals, vastus lateralis , rectus femoris especially at junction on Left    Myofascial Release IASTYM to lumbar bil multifdus, bil rectus femoris, vastus lateralis.  Left > Right sensitivity          Trigger Point Dry Needling - 04/06/15 1705    Consent Given? Yes   Muscles Treated Lower Body  Quadriceps  lumbar multifidi quadratus lumborum bil   Gluteus Maximus Response --  paraspinals with twitch response   Quadriceps Response Twitch response elicited;Palpable increased muscle length              PT Education - 04/06/15 1730    Education provided Yes   Education Details verbally reviewed questions for lumbar multifidus breifly.  Educated on inflammatory response of dry needling and importance of heat after TDN, standing quad stretch   Person(s) Educated Patient   Methods Explanation;Demonstration;Verbal cues   Comprehension Verbalized understanding          PT Short Term Goals - 04/03/15 6213    PT SHORT TERM GOAL #1   Title pt will be I with basice HEP (03/10/2015)   Status Achieved   PT SHORT TERM GOAL #2   Title pt will be bale to verbalize and demonstate tehcniques to reduce low back pain via postural awareness, lifting and carrying mechanics, and HEP (03/10/2015)   Status Achieved           PT Long Term Goals - 04/06/15 1751    PT LONG TERM GOAL #1   Title upon discharge pt will be I with all HEP given throughout therapy (03/31/2015)   Time 6   Period Weeks   Status On-going   PT LONG TERM GOAL #2   Title She will be able to navigate >10 steps and walk or stand for > 30 minutes with < 2/10 pain to assist with walking 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 (03/31/2015)   Baseline 7/10   Time 6   Period Weeks   Status On-going   PT LONG TERM GOAL #3   Title pt will increase her lumbar mobility by > 10 degrees in all planes to help with ADLs (03/31/2015)   Time 6   Period Weeks   Status On-going   PT LONG TERM GOAL #4   Title She will demonstrate no lumbar or hip muscular spasm to assist with standing walking endurance and trunk mobility (03/31/2015)   Time 6   Period Weeks   Status On-going   PT LONG TERM GOAL #5   Title She will increase her FOTO score to > 53 to demonstrate improved functional capacity upon discharge (03/31/2015)   Baseline  today intake 45%  limitation55%  improved from eval 69%   Time 6   Period Weeks   Status On-going               Plan - 04/06/15 1732  Clinical Impression Statement Stephanie Miles reports her pain is worse today due to weather barometric pressure  5/10 back and 7/10 knees.  FOTO was taken and intake limitation 55% from 69% on eval.  Predicted is 47% limtation. Goal of > 53% (today 45%)pt is making gains.  Pt reports that back pain is normally doing better with TDN and exercise.  Pt also educated on standing quadriped exercise for lengthening stretching post TDN  Pt at end of treatment stated back pain down to 3/10, knees were still sore but  had increased length post needling.  Knee pain down to 5/1- from 7/10.  Will  continue to progress core exercise.  Pt  like the taping but declined today due to  extra school work load.    Pt will benefit from skilled therapeutic intervention in order to improve on the following deficits Pain;Improper body mechanics;Postural dysfunction;Decreased mobility;Decreased activity tolerance;Decreased endurance;Decreased range of motion;Decreased strength;Hypomobility;Decreased balance   Rehab Potential Good   PT Frequency 2x / week   PT Duration 4 weeks   PT Treatment/Interventions ADLs/Self Care Home Management;Cryotherapy;Electrical Stimulation;Iontophoresis 38m/ml Dexamethasone;Moist Heat;Ultrasound;Gait training;Therapeutic activities;Therapeutic exercise;Neuromuscular re-education;Manual techniques;Passive range of motion;Dry needling;Taping;Patient/family education   PT Next Visit Plan continue abdominal and multifidus exercises. FOTO taken last visit with improvement.  Needs to assess goals and ROM next visit   Consulted and Agree with Plan of Care Patient        Problem List Patient Active Problem List   Diagnosis Date Noted  . Aortic regurgitation due to bicuspid aortic valve 03/12/2014  . Bilateral lower extremity edema 03/12/2014  . Personal  history of tobacco use, presenting hazards to health 01/20/2014  . Anxiety state, unspecified 10/16/2013  . Ileus 07/08/2013  . Migraine 07/08/2013  . Sleep apnea 07/08/2013  . UTI (urinary tract infection) 07/08/2013  . IBS (irritable bowel syndrome) 06/06/2013  . GERD (gastroesophageal reflux disease) 06/06/2013  . HTN (hypertension) 06/06/2013   LVoncille Lo PT 04/06/2015 5:53 PM Phone: [phone]: [facility] [address] [address]Phone: [phone]  Fax:  [phone]

## 2015-04-07 ENCOUNTER — Telehealth: Payer: Self-pay

## 2015-04-07 NOTE — Telephone Encounter (Signed)
Patient not available Left message on voice mail to return our call 

## 2015-04-07 NOTE — Telephone Encounter (Signed)
-----   Message from Lance Bosch, NP sent at 04/07/2015 10:08 AM EDT ----- Potassium is normal

## 2015-04-07 NOTE — Telephone Encounter (Signed)
Patient returned phone call, please f/u with pt.

## 2015-04-08 ENCOUNTER — Ambulatory Visit: Payer: No Typology Code available for payment source | Admitting: Physical Therapy

## 2015-04-08 DIAGNOSIS — M545 Low back pain, unspecified: Secondary | ICD-10-CM

## 2015-04-08 DIAGNOSIS — R293 Abnormal posture: Secondary | ICD-10-CM

## 2015-04-08 DIAGNOSIS — M25562 Pain in left knee: Secondary | ICD-10-CM

## 2015-04-08 DIAGNOSIS — M25561 Pain in right knee: Secondary | ICD-10-CM

## 2015-04-08 DIAGNOSIS — M62830 Muscle spasm of back: Secondary | ICD-10-CM

## 2015-04-08 DIAGNOSIS — M2560 Stiffness of unspecified joint, not elsewhere classified: Secondary | ICD-10-CM

## 2015-04-08 NOTE — Therapy (Signed)
[place], [place], [address] Phone: [phone]   Fax:  [phone]  Physical Therapy Treatment  Patient Details  Name: Stephanie Miles MRN: [id number] Date of Birth: 09-Feb-1981 Referring Provider:  Lance Bosch, NP  Encounter Date: 04/08/2015      PT End of Session - 04/08/15 1723    Visit Number 12   Number of Visits 17   Date for PT Re-Evaluation 04/29/15   Authorization Type GCCN   PT Start Time 1630   PT Stop Time 1735   PT Time Calculation (min) 65 min   Activity Tolerance Patient tolerated treatment well   Behavior During Therapy [facility] for tasks assessed/performed      Past Medical History  Diagnosis Date  . Hypertension   . GERD (gastroesophageal reflux disease)   . IBS (irritable bowel syndrome)   . Migraine headache   . S/P appy 05/2013  . Anxiety   . Allergy   . Depression   . Heart murmur   . Chronic kidney disease     kidney stones  . Asthma   . Aortic regurgitation due to bicuspid aortic valve     Past Surgical History  Procedure Laterality Date  . Dental surgery    . Laparoscopic appendectomy N/A 06/06/2013    Procedure: APPENDECTOMY LAPAROSCOPIC;  Surgeon: Imogene Burn. Georgette Dover, MD;  Location: [place];  Service: General;  Laterality: N/A;  . Appendectomy      There were no vitals filed for this visit.  Visit Diagnosis:  Midline low back pain without sciatica  Joint stiffness of spine  Muscle spasm of back  Bilateral knee pain  Abnormal posture      Subjective Assessment - 04/08/15 1634    Subjective "the dry needling feels like it is helping with my low back and quads"   Currently in Pain? Yes   Pain Score 4    Pain Location Back   Pain Orientation Right;Left   Pain Descriptors / Indicators Aching   Pain Type Chronic pain   Pain Onset More than a month ago   Pain Frequency Intermittent   Aggravating Factors  standing, walkng   Pain Relieving Factors  resting   Pain Score 1  6/10 with pushing   Pain Location Knee   Pain Orientation Right;Left   Pain Descriptors / Indicators Aching   Pain Type Chronic pain   Pain Onset More than a month ago   Pain Frequency Constant   Aggravating Factors  pushing with  the legs   Pain Relieving Factors resting                         [facility] Adult PT Treatment/Exercise - 04/08/15 1637    Lumbar Exercises: Stretches   Passive Hamstring Stretch 2 reps;30 seconds   Single Knee to Chest Stretch 2 reps;30 seconds   Lower Trunk Rotation 4 reps;30 seconds   Lumbar Exercises: Aerobic   Stationary Bike NuStep L 5 x 10 min   Lumbar Exercises: Standing   Other Standing Lumbar Exercises hip extension/ abduction x 10 ea. bil   Lumbar Exercises: Supine   Clam 15 reps  green theraband   Bent Knee Raise 20 reps  x 2 sets, Vc for transverse abdominis contraction   Straight Leg Raise 1 second;15 reps  performed bil, with quad set   Moist Heat Therapy   Number Minutes Moist Heat 15 Minutes   Moist Heat Location Lumbar  Spine;Knee  in supine   Electrical Stimulation   Electrical Stimulation Location low back   Electrical Stimulation Action IFC   Electrical Stimulation Parameters 15 min, 100% scan, level 15   Electrical Stimulation Goals Pain   Manual Therapy   Soft tissue mobilization to lumbar paraspinals, vastus lateralis , rectus femoris especially at  quad tendon bil                 PT Education - 04/08/15 1723    Education provided Yes   Education Details answered pt's questions, and updated HEP   Person(s) Educated Patient   Methods Explanation   Comprehension Verbalized understanding          PT Short Term Goals - 04/03/15 0822    PT SHORT TERM GOAL #1   Title pt will be I with basice HEP (03/10/2015)   Status Achieved   PT SHORT TERM GOAL #2   Title pt will be bale to verbalize and demonstate tehcniques to reduce low back pain via postural awareness, lifting and  carrying mechanics, and HEP (03/10/2015)   Status Achieved           PT Long Term Goals - 04/08/15 1727    PT LONG TERM GOAL #1   Title upon discharge pt will be I with all HEP given throughout therapy (03/31/2015)   Time 6   Period Weeks   Status On-going   PT LONG TERM GOAL #2   Title She will be able to navigate >10 steps and walk or stand for > 30 minutes with < 2/10 pain to assist with walking 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 (03/31/2015)   Baseline 7/10   Time 6   Period Weeks   Status On-going   PT LONG TERM GOAL #3   Title pt will increase her lumbar mobility by > 10 degrees in all planes to help with ADLs (03/31/2015)   Time 6   Period Weeks   Status On-going   PT LONG TERM GOAL #4   Title She will demonstrate no lumbar or hip muscular spasm to assist with standing walking endurance and trunk mobility (03/31/2015)   Time 6   Period Weeks   Status On-going   PT LONG TERM GOAL #5   Title She will increase her FOTO score to > 53 to demonstrate improved functional capacity upon discharge (03/31/2015)   Baseline today intake 45%  limitation55%  improved from eval 69%   Time 6   Period Weeks   Status On-going               Plan - 04/08/15 1723    Clinical Impression Statement [name] states tha the back pain is doing better today but has the most pain in the quad tendon bil. She was able to perform and complete all exercises without complaint of pain or increased symptoms. Educated about how hip weakness can contribute to pain in the knees, and updated HEP. didn't tape the knee today due to the pt wearing jeans that she couldn't roll up. Plan to continue working toward her goals.    PT Next Visit Plan continue abdominal and multifidus exercises, hip strengthening, Manual for quads/ multifidus, dry needling.    PT Home Exercise Plan standing hip extension/ abduction.    Consulted and Agree with Plan of Care Patient        Problem List Patient Active Problem  List   Diagnosis Date Noted  . Aortic regurgitation due  to bicuspid aortic valve 03/12/2014  . Bilateral lower extremity edema 03/12/2014  . Personal history of tobacco use, presenting hazards to health 01/20/2014  . Anxiety state, unspecified 10/16/2013  . Ileus 07/08/2013  . Migraine 07/08/2013  . Sleep apnea 07/08/2013  . UTI (urinary tract infection) 07/08/2013  . IBS (irritable bowel syndrome) 06/06/2013  . GERD (gastroesophageal reflux disease) 06/06/2013  . HTN (hypertension) 06/06/2013   Starr Lake PT, DPT, LAT, ATC  04/08/2015  5:29 PM      [place] [facility] [address] [place], [place], [address] Phone: [phone]   Fax:  [phone]

## 2015-04-08 NOTE — Patient Instructions (Signed)
   Edia Pursifull PT, DPT, LAT, ATC  [facility] Phone: [phone]     

## 2015-04-16 ENCOUNTER — Ambulatory Visit: Payer: No Typology Code available for payment source | Admitting: Physical Therapy

## 2015-04-16 DIAGNOSIS — M62830 Muscle spasm of back: Secondary | ICD-10-CM

## 2015-04-16 DIAGNOSIS — R293 Abnormal posture: Secondary | ICD-10-CM

## 2015-04-16 DIAGNOSIS — M25561 Pain in right knee: Secondary | ICD-10-CM

## 2015-04-16 DIAGNOSIS — M545 Low back pain, unspecified: Secondary | ICD-10-CM

## 2015-04-16 DIAGNOSIS — M25562 Pain in left knee: Secondary | ICD-10-CM

## 2015-04-16 DIAGNOSIS — M2560 Stiffness of unspecified joint, not elsewhere classified: Secondary | ICD-10-CM

## 2015-04-16 NOTE — Therapy (Signed)
[place], [place], [address] Phone: [phone]   Fax:  [phone]  Physical Therapy Treatment  Patient Details  Name: Stephanie Miles MRN: [id number] Date of Birth: 08/04/1980 Referring Provider:  Lance Bosch, NP  Encounter Date: 04/16/2015      PT End of Session - 04/16/15 1302    Visit Number 11   Number of Visits 17   Date for PT Re-Evaluation 04/29/15   Authorization Type GCCN   PT Start Time 5397   PT Stop Time 1246   PT Time Calculation (min) 61 min   Activity Tolerance Patient limited by pain  Pt more hypersensitive to knee TDN today.   Behavior During Therapy [facility] for tasks assessed/performed      Past Medical History  Diagnosis Date  . Hypertension   . GERD (gastroesophageal reflux disease)   . IBS (irritable bowel syndrome)   . Migraine headache   . S/P appy 05/2013  . Anxiety   . Allergy   . Depression   . Heart murmur   . Chronic kidney disease     kidney stones  . Asthma   . Aortic regurgitation due to bicuspid aortic valve     Past Surgical History  Procedure Laterality Date  . Dental surgery    . Laparoscopic appendectomy N/A 06/06/2013    Procedure: APPENDECTOMY LAPAROSCOPIC;  Surgeon: Imogene Burn. Georgette Dover, MD;  Location: [place];  Service: General;  Laterality: N/A;  . Appendectomy      There were no vitals filed for this visit.  Visit Diagnosis:  Midline low back pain without sciatica  Joint stiffness of spine  Muscle spasm of back  Bilateral knee pain  Abnormal posture      Subjective Assessment - 04/16/15 1150    Subjective Back is now a 3/10 due to the rain but it has been 2/10 and that has been great.   Limitations Sitting;Lifting;Standing;Walking;House hold activities   How long can you sit comfortably? 90 min   How long can you stand comfortably? 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 Yes   Pain Score 3    Pain Location Back   Pain Orientation Right;Left   Pain Descriptors / Indicators Aching   Pain Type Chronic pain   Pain Onset More than a month ago   Pain Frequency Intermittent   Pain Score 6   Pain Location Knee   Pain Orientation Right;Left   Pain Descriptors / Indicators Aching   Pain Type Chronic pain   Pain Onset More than a month ago            [facility] PT Assessment - 04/16/15 1226    AROM   Right Knee Extension 0   Right Knee Flexion 140   Left Knee Extension 0   Left Knee Flexion 135  pain at endrange/ tightened psoas   Lumbar Flexion 80   Lumbar Extension 22   Lumbar - Right Side Bend 35   Lumbar - Left Side Bend 36   Lumbar - Right Rotation 100%   Lumbar - Left Rotation 100%   Palpation   Palpation comment marked tenderness Left knee > right at anterior lateral musculature above patella today.  Pt with mod to severe left psoas tightness.  [place] Adult PT Treatment/Exercise - 04/16/15 1154    Knee/Hip Exercises: Stretches   Other Knee/Hip Stretches Psoas Stretches in aquatics standing 30-60 second stretch,  supine hip flexor stretch. 30-60 second hold  each times 2   Aquatics simulated. with UE hold   Knee/Hip Exercises: Supine   Straight Leg Raise with External Rotation AROM;Strengthening;Right;2 sets;10 reps;Left   Other Supine Knee/Hip Exercises SLR bil  10x slow control. 10 x fast and then 10 x 10 sec hover for motor control   Moist Heat Therapy   Number Minutes Moist Heat 15 Minutes   Moist Heat Location Lumbar Spine;Knee  in supine   Electrical Stimulation   Electrical Stimulation Location low back   Electrical Stimulation Action IFC   Electrical Stimulation Parameters 15 min   Electrical Stimulation Goals Pain   Manual Therapy   Manual Therapy Soft tissue mobilization;Myofascial release   Manual  therapy comments also contract relax for knee ext 5 x  for 10 sec each bil knees with palpable lengthening   Soft tissue mobilization to lumbar paraspinals, vastus lateralis , rectus femoris especially at  quad tendon bil , left psoas    Myofascial Release IASTYM to lumbar bil multifdus, bil rectus femoris, vastus lateralis.  Left > Right sensitivity          Trigger Point Dry Needling - 04/16/15 1157    Muscles Treated Lower Body Quadriceps  bil rectus femoris and vastus lateralis              PT Education - 04/16/15 1202    Education provided Yes   Education Details Discussed locking knees and need for "soft" knees in stance with pt anterior lean/ discussed psoas tightness    Person(s) Educated Patient          PT Short Term Goals - 04/03/15 0822    PT SHORT TERM GOAL #1   Title pt will be I with basice HEP (03/10/2015)   Status Achieved   PT SHORT TERM GOAL #2   Title pt will be bale to verbalize and demonstate tehcniques to reduce low back pain via postural awareness, lifting and carrying mechanics, and HEP (03/10/2015)   Status Achieved           PT Long Term Goals - 04/16/15 1323    PT LONG TERM GOAL #1   Title upon discharge pt will be I with all HEP given throughout therapy (03/31/2015)   Time 6   Period Weeks   Status On-going   PT LONG TERM GOAL #2   Baseline back pain 2/10  knee pain 6/10   Time 6   Period Weeks   Status On-going   PT LONG TERM GOAL #3   Title pt will increase her lumbar mobility by > 10 degrees in all planes to help with ADLs (03/31/2015)   Baseline Pt increased all lumbar mobility within normal limits with 2/10 pain   Time 6   Period Weeks   Status Achieved   PT LONG TERM GOAL #4   Title She will demonstrate no lumbar or hip muscular spasm to assist with standing walking endurance and trunk mobility (03/31/2015)   Baseline Pt with tendernes on left psoas   Time 6   Period Weeks   Status On-going   PT LONG TERM GOAL #5   Title She  will increase her FOTO score to > 53 to demonstrate improved functional capacity upon discharge (03/31/2015)   Baseline today intake 45%  limitation55%  improved  from eval 69% on 04-09-15   Time 6   Period Weeks   Status On-going               Plan - 04/16/15 1303    Clinical Impression Statement Pt with 2/10 back pain , much better than before.  Pt has increased AROM in all planes in lumbar with minimal change in AROM of knees.  Pt also has mod to severe tightening of Left psoas which may contribute to low back pain and left knee pain as well.  Pt  was instructed in importance of  muscle strengthening for pain rlief of arthritic knees and also postural habit as contributor of knee pain.  LTG # 3 achieved since Lumbar mobility is now within  normal limits   PT Next Visit Plan continue abdominal and multifidus exercises, hip strengthening, Manual for quads/ multifidus, Left psoas  and  dry needling.    PT Home Exercise Plan Added psoas stretches.        Problem List Patient Active Problem List   Diagnosis Date Noted  . Aortic regurgitation due to bicuspid aortic valve 03/12/2014  . Bilateral lower extremity edema 03/12/2014  . Personal history of tobacco use, presenting hazards to health 01/20/2014  . Anxiety state, unspecified 10/16/2013  . Ileus 07/08/2013  . Migraine 07/08/2013  . Sleep apnea 07/08/2013  . UTI (urinary tract infection) 07/08/2013  . IBS (irritable bowel syndrome) 06/06/2013  . GERD (gastroesophageal reflux disease) 06/06/2013  . HTN (hypertension) 06/06/2013   Voncille Lo, PT 04/16/2015 1:29 PM Phone: [phone] Fax: [facility] [address] [place], [place], [address] Phone: [phone]   Fax:  [phone]

## 2015-04-16 NOTE — Patient Instructions (Signed)
Quads / HF, Supine   Lie near edge of bed, one leg bent, foot flat on bed. Other leg hanging over edge, relaxed, thigh resting entirely on bed. Bend hanging knee backward keeping thigh in contact with bed. Hold _60__ seconds.  Repeat __2-3_ times per session. Do 2___ sessions per day.  Copyright  VHI. All rights reserved.  Quadriceps Stretch   Reach behind with left arm and hold left leg, ankle, or foot. Hold position for 30 sec in pool  Repeat on other side. Repeat __3_ times, alternating sides. Do __1_ times per day.  Copyright  VHI. All rights reserved.  May use tennis ball for self- myofascial added stretch as shown in clinic   Stephanie Miles, [place] 04/16/2015 12:29 PM Phone: [phone] Fax: [phone]

## 2015-04-22 ENCOUNTER — Ambulatory Visit: Payer: No Typology Code available for payment source | Admitting: Physical Therapy

## 2015-04-23 ENCOUNTER — Ambulatory Visit: Payer: No Typology Code available for payment source | Admitting: Physical Therapy

## 2015-04-24 ENCOUNTER — Other Ambulatory Visit: Payer: Self-pay | Admitting: Internal Medicine

## 2015-04-27 ENCOUNTER — Ambulatory Visit
Payer: No Typology Code available for payment source | Attending: Physical Medicine and Rehabilitation | Admitting: Physical Therapy

## 2015-04-27 DIAGNOSIS — M545 Low back pain, unspecified: Secondary | ICD-10-CM

## 2015-04-27 DIAGNOSIS — M25561 Pain in right knee: Secondary | ICD-10-CM | POA: Insufficient documentation

## 2015-04-27 DIAGNOSIS — M25562 Pain in left knee: Secondary | ICD-10-CM | POA: Insufficient documentation

## 2015-04-27 DIAGNOSIS — M2560 Stiffness of unspecified joint, not elsewhere classified: Secondary | ICD-10-CM | POA: Insufficient documentation

## 2015-04-27 DIAGNOSIS — R293 Abnormal posture: Secondary | ICD-10-CM | POA: Insufficient documentation

## 2015-04-27 DIAGNOSIS — M62830 Muscle spasm of back: Secondary | ICD-10-CM | POA: Insufficient documentation

## 2015-04-27 NOTE — Therapy (Signed)
[place], [place], [address] Phone: [phone]   Fax:  [phone]  Physical Therapy Treatment  Patient Details  Name: Stephanie Miles MRN: [id number] Date of Birth: [birth year] Referring Provider:  Lance Bosch, NP  Encounter Date: 04/27/2015      PT End of Session - 04/27/15 1728    Visit Number 12   Number of Visits 17   Date for PT Re-Evaluation 04/29/15   PT Start Time 5638   PT Stop Time 7564   PT Time Calculation (min) 45 min   Activity Tolerance Patient tolerated treatment well;No increased pain   Behavior During Therapy [facility] for tasks assessed/performed      Past Medical History  Diagnosis Date  . Hypertension   . GERD (gastroesophageal reflux disease)   . IBS (irritable bowel syndrome)   . Migraine headache   . S/P appy 05/2013  . Anxiety   . Allergy   . Depression   . Heart murmur   . Chronic kidney disease     kidney stones  . Asthma   . Aortic regurgitation due to bicuspid aortic valve     Past Surgical History  Procedure Laterality Date  . Dental surgery    . Laparoscopic appendectomy N/A 06/06/2013    Procedure: APPENDECTOMY LAPAROSCOPIC;  Surgeon: Imogene Burn. Georgette Dover, MD;  Location: [place];  Service: General;  Laterality: N/A;  . Appendectomy      There were no vitals filed for this visit.  Visit Diagnosis:  Midline low back pain without sciatica  Joint stiffness of spine  Bilateral knee pain  Abnormal posture  Muscle spasm of back      Subjective Assessment - 04/27/15 1637    Subjective 2/10.    i have a problem with strength.  I'd like to work on strength.    Currently in Pain? Yes   Pain Score 2    Pain Location Back   Pain Orientation Right;Left   Pain Descriptors / Indicators Aching;Burning   Pain Frequency Intermittent   Aggravating Factors  riding a bike   Multiple Pain Sites --  Knees burn distal quads with riding bike, steps.                          [facility] Adult PT Treatment/Exercise - 04/27/15 1654    Lumbar Exercises: Prone   Single Arm Raise --  10 X   Straight Leg Raise 5 reps   Straight Leg Raises Limitations Rt 4/5 MMT extension, Lt 5/5   Opposite Arm/Leg Raise 5 reps   Other Prone Lumbar Exercises multifitus   cues initially for technique   Knee/Hip Exercises: Stretches   Gastroc Stretch 3 reps;30 seconds   Knee/Hip Exercises: Machines for Strengthening   Cybex Knee Flexion 2 plates 10 flexion 10 Single  leg  eccentric release 5 X each.   Knee/Hip Exercises: Standing   Heel Raises --  single 10 X each   Forward Step Up Both;1 set;15 reps;Hand Hold: 2;Step Height: 6"                  PT Short Term Goals - 04/03/15 3329    PT SHORT TERM GOAL #1   Title pt will be I with basice HEP (03/10/2015)   Status Achieved   PT SHORT TERM GOAL #2   Title pt will be bale to verbalize and demonstate tehcniques to reduce low back pain via postural awareness,  lifting and carrying mechanics, and HEP (03/10/2015)   Status Achieved           PT Long Term Goals - 04/27/15 1730    PT LONG TERM GOAL #1   Title upon discharge pt will be I with all HEP given throughout therapy (03/31/2015)   Time 6   Period Weeks   Status On-going   PT LONG TERM GOAL #2   Title She will be able to navigate >10 steps and walk or stand for > 30 minutes with < 2/10 pain to assist with walking 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 ascending  at 1/2 way decending stairs.   Time 6   Period Weeks   Status On-going   PT LONG TERM GOAL #3   Title pt will increase her lumbar mobility by > 10 degrees in all planes to help with ADLs (03/31/2015)   Time 6   Period Weeks   Status Achieved   PT LONG TERM GOAL #4   Title She will demonstrate no lumbar or hip muscular spasm to assist with standing walking endurance and trunk  mobility (03/31/2015)   Time 6   Period Weeks   Status Unable to assess   PT LONG TERM GOAL #5   Title She will increase her FOTO score to > 53 to demonstrate improved functional capacity upon discharge (03/31/2015)   Time 6   Period Weeks   Status Unable to assess               Plan - 04/27/15 1728    Clinical Impression Statement MMT Quads RT 4/5, LT 5/5, Hip extension RT 4/5, LT 5/5.  Focus on strengthening. pain 2/10 in back. unchanges, 0 knee pain at rest.   PT Next Visit Plan continue abdominal and multifidus exercises, hip strengthening, Manual for quads/ multifidus, Left psoas  and  dry needling.    Consulted and Agree with Plan of Care Patient        Problem List Patient Active Problem List   Diagnosis Date Noted  . Aortic regurgitation due to bicuspid aortic valve 03/12/2014  . Bilateral lower extremity edema 03/12/2014  . Personal history of tobacco use, presenting hazards to health 01/20/2014  . Anxiety state, unspecified 10/16/2013  . Ileus ([place]) 07/08/2013  . Migraine 07/08/2013  . Sleep apnea 07/08/2013  . UTI (urinary tract infection) 07/08/2013  . IBS (irritable bowel syndrome) 06/06/2013  . GERD (gastroesophageal reflux disease) 06/06/2013  . HTN (hypertension) 06/06/2013    Keanthony Poole 04/27/2015, 5:33 PM  [facility] [address] [place], [place], [address] Phone: [phone]   Fax:  [phone]     Melvenia Needles, PTA 04/27/2015 5:33 PM Phone: [phone] Fax: [phone]

## 2015-04-30 ENCOUNTER — Ambulatory Visit: Payer: No Typology Code available for payment source | Admitting: Physical Therapy

## 2015-05-04 ENCOUNTER — Ambulatory Visit: Payer: No Typology Code available for payment source | Admitting: Physical Therapy

## 2015-05-04 DIAGNOSIS — R293 Abnormal posture: Secondary | ICD-10-CM

## 2015-05-04 DIAGNOSIS — M545 Low back pain, unspecified: Secondary | ICD-10-CM

## 2015-05-04 DIAGNOSIS — M2560 Stiffness of unspecified joint, not elsewhere classified: Secondary | ICD-10-CM

## 2015-05-04 DIAGNOSIS — M25561 Pain in right knee: Secondary | ICD-10-CM

## 2015-05-04 DIAGNOSIS — M25562 Pain in left knee: Secondary | ICD-10-CM

## 2015-05-04 DIAGNOSIS — M62830 Muscle spasm of back: Secondary | ICD-10-CM

## 2015-05-04 NOTE — Therapy (Addendum)
[place], [place], [address] Phone: [phone]   Fax:  [phone]  Physical Therapy Treatment / Re-certification  Patient Details  Name: Stephanie Miles MRN: [id number] Date of Birth: 12-06-1980 Referring Provider:  Lance Bosch, NP  Encounter Date: 05/04/2015      PT End of Session - 05/04/15 1650    Visit Number 13   Number of Visits 17   Date for PT Re-Evaluation 06/01/15   Authorization Type GCCN   PT Start Time 1630   PT Stop Time 1732   PT Time Calculation (min) 62 min   Activity Tolerance Patient tolerated treatment well   Behavior During Therapy [facility] for tasks assessed/performed      Past Medical History  Diagnosis Date  . Hypertension   . GERD (gastroesophageal reflux disease)   . IBS (irritable bowel syndrome)   . Migraine headache   . S/P appy 05/2013  . Anxiety   . Allergy   . Depression   . Heart murmur   . Chronic kidney disease     kidney stones  . Asthma   . Aortic regurgitation due to bicuspid aortic valve     Past Surgical History  Procedure Laterality Date  . Dental surgery    . Laparoscopic appendectomy N/A 06/06/2013    Procedure: APPENDECTOMY LAPAROSCOPIC;  Surgeon: Imogene Burn. Georgette Dover, MD;  Location: [place];  Service: General;  Laterality: N/A;  . Appendectomy      There were no vitals filed for this visit.  Visit Diagnosis:  Midline low back pain without sciatica - Plan: PT plan of care cert/re-cert  Joint stiffness of spine - Plan: PT plan of care cert/re-cert  Bilateral knee pain - Plan: PT plan of care cert/re-cert  Abnormal posture - Plan: PT plan of care cert/re-cert  Muscle spasm of back - Plan: PT plan of care cert/re-cert      Subjective Assessment - 05/04/15 1637    Subjective "I just got back from a conference and did ok but my back did start getting more sore with the walking"    Currently in Pain? Yes   Pain Score 2    Pain Location  Back   Pain Orientation Right;Left   Pain Descriptors / Indicators Aching   Pain Type Chronic pain   Pain Onset More than a month ago   Pain Frequency Intermittent   Aggravating Factors  prolonged walking/ stand   Pain Relieving Factors resting   Pain Score 2   Pain Location Knee   Pain Orientation Right;Left   Pain Descriptors / Indicators Aching   Pain Type Chronic pain   Pain Onset More than a month ago   Pain Frequency Constant                         [facility] Adult PT Treatment/Exercise - 05/04/15 1644    Lumbar Exercises: Stretches   Passive Hamstring Stretch 2 reps;30 seconds   Single Knee to Chest Stretch 2 reps;30 seconds   Lower Trunk Rotation 4 reps;30 seconds   Lumbar Exercises: Aerobic   Stationary Bike NuStep L 5 x 10 min   Lumbar Exercises: Supine   Bent Knee Raise 20 reps   Straight Leg Raise 15 reps;1 second   Moist Heat Therapy   Number Minutes Moist Heat 10 Minutes   Moist Heat Location Lumbar Spine  in supine   Manual Therapy   Soft tissue mobilization to lumbar  paraspinals with instrument assisted STM          Trigger Point Dry Needling - 05/04/15 1705    Consent Given? Yes   Muscles Treated Upper Body Quadratus Lumborum  Lumbar multifidus                 PT Short Term Goals - 04/03/15 5093    PT SHORT TERM GOAL #1   Title pt will be I with basice HEP (03/10/2015)   Status Achieved   PT SHORT TERM GOAL #2   Title pt will be bale to verbalize and demonstate tehcniques to reduce low back pain via postural awareness, lifting and carrying mechanics, and HEP (03/10/2015)   Status Achieved           PT Long Term Goals - 05/04/15 1727    PT LONG TERM GOAL #1   Title upon discharge pt will be I with all HEP given throughout therapy (03/31/2015)   Time 6   Period Weeks   Status On-going   PT LONG TERM GOAL #2   Title She will be able to navigate >10 steps and walk or stand for > 30 minutes with < 2/10 pain to assist with  walking 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 ascending  at 1/2 way decending stairs.   Time 6   Period Weeks   Status On-going   PT LONG TERM GOAL #3   Title pt will increase her lumbar mobility by > 10 degrees in all planes to help with ADLs (03/31/2015)   Baseline Pt increased all lumbar mobility within normal limits with 2/10 pain   Time 6   Period Weeks   Status Achieved   PT LONG TERM GOAL #4   Title She will demonstrate no lumbar or hip muscular spasm to assist with standing walking endurance and trunk mobility (03/31/2015)   Baseline Pt with tendernes on left psoas   Time 6   Period Weeks   Status Achieved   PT LONG TERM GOAL #5   Title She will increase her FOTO score to > 53 to demonstrate improved functional capacity upon discharge (03/31/2015)   Baseline improved to 50% limited on 05/04/2015   Time 6   Status On-going               Plan - 05/04/15 1654    Clinical Impression Statement Sumire continues to make great progress with increased knee and back mobility and strength. She intermittently demonstrates tightness and pain inthe back and knees following prolonged walking and standing activities as well as biking. She reports relief with McConnel tapeing of bil knees and would benefit from a bilateral medial patellar stabilizing brace. She provided consent for dry needling of the L lumbar multifidus and Qudadratus lomburom with a twitch elicited. She was able to complete all other exercises without complaint.  Plan to continue with current POC and work toward goals.    Pt will benefit from skilled therapeutic intervention in order to improve on the following deficits Pain;Improper body mechanics;Postural dysfunction;Decreased mobility;Decreased activity tolerance;Decreased endurance;Decreased range of motion;Decreased strength;Hypomobility;Decreased balance   Rehab Potential  Good   PT Frequency 1x / week   PT Duration 4 weeks   PT Treatment/Interventions ADLs/Self Care Home Management;Cryotherapy;Electrical Stimulation;Iontophoresis 59m/ml Dexamethasone;Moist Heat;Ultrasound;Gait training;Therapeutic activities;Therapeutic exercise;Neuromuscular re-education;Manual techniques;Passive range of motion;Dry needling;Taping;Patient/family education   PT Next Visit Plan continue abdominal  and multifidus exercises, hip strengthening, Manual for quads/ multifidus, Left psoas  and  dry needling, call about medial patellar stabilzer braces if script returned signed.    Consulted and Agree with Plan of Care Patient        Problem List Patient Active Problem List   Diagnosis Date Noted  . Aortic regurgitation due to bicuspid aortic valve 03/12/2014  . Bilateral lower extremity edema 03/12/2014  . Personal history of tobacco use, presenting hazards to health 01/20/2014  . Anxiety state, unspecified 10/16/2013  . Ileus ([place]) 07/08/2013  . Migraine 07/08/2013  . Sleep apnea 07/08/2013  . UTI (urinary tract infection) 07/08/2013  . IBS (irritable bowel syndrome) 06/06/2013  . GERD (gastroesophageal reflux disease) 06/06/2013  . HTN (hypertension) 06/06/2013   Starr Lake PT, DPT, LAT, ATC  05/05/2015  8:03 AM      [facility] [address] [place], [place], [address] Phone: [phone]   Fax:  [phone]

## 2015-05-06 ENCOUNTER — Ambulatory Visit: Payer: No Typology Code available for payment source | Admitting: Cardiology

## 2015-05-18 ENCOUNTER — Ambulatory Visit: Payer: No Typology Code available for payment source | Admitting: Physical Therapy

## 2015-05-18 DIAGNOSIS — M545 Low back pain, unspecified: Secondary | ICD-10-CM

## 2015-05-18 DIAGNOSIS — M25562 Pain in left knee: Secondary | ICD-10-CM

## 2015-05-18 DIAGNOSIS — M25561 Pain in right knee: Secondary | ICD-10-CM

## 2015-05-18 DIAGNOSIS — M2560 Stiffness of unspecified joint, not elsewhere classified: Secondary | ICD-10-CM

## 2015-05-18 DIAGNOSIS — R293 Abnormal posture: Secondary | ICD-10-CM

## 2015-05-18 DIAGNOSIS — M62830 Muscle spasm of back: Secondary | ICD-10-CM

## 2015-05-18 NOTE — Therapy (Signed)
[place], [place], [address] Phone: [phone]   Fax:  [phone]  Physical Therapy Treatment  Patient Details  Name: Kinlee Garrison MRN: [id number] Date of Birth: 08/23/1980 No Data Recorded  Encounter Date: 05/18/2015      PT End of Session - 05/18/15 1729    Visit Number 14   Number of Visits 17   Date for PT Re-Evaluation 06/01/15   Authorization Type GCCN   PT Start Time 1630   PT Stop Time 1725   PT Time Calculation (min) 55 min   Activity Tolerance Patient tolerated treatment well   Behavior During Therapy [facility] for tasks assessed/performed      Past Medical History  Diagnosis Date  . Hypertension   . GERD (gastroesophageal reflux disease)   . IBS (irritable bowel syndrome)   . Migraine headache   . S/P appy 05/2013  . Anxiety   . Allergy   . Depression   . Heart murmur   . Chronic kidney disease     kidney stones  . Asthma   . Aortic regurgitation due to bicuspid aortic valve     Past Surgical History  Procedure Laterality Date  . Dental surgery    . Laparoscopic appendectomy N/A 06/06/2013    Procedure: APPENDECTOMY LAPAROSCOPIC;  Surgeon: Imogene Burn. Georgette Dover, MD;  Location: [place];  Service: General;  Laterality: N/A;  . Appendectomy      There were no vitals filed for this visit.  Visit Diagnosis:  Midline low back pain without sciatica  Joint stiffness of spine  Bilateral knee pain  Abnormal posture  Muscle spasm of back      Subjective Assessment - 05/18/15 1633    Subjective "I've been doing alright, [place] and it wasn't  too bad, but still had some soreness in the knees and low back"    Currently in Pain? Yes   Pain Score 4    Pain Location Back   Pain Orientation Right;Left   Pain Descriptors / Indicators Aching   Pain Type Chronic pain   Pain Onset More than a month ago   Aggravating Factors  prolonged walking/ standing    Pain Score 6   Pain Location Knee   Pain Orientation Right;Left   Pain Descriptors / Indicators Aching   Pain Type Chronic pain   Pain Onset More than a month ago   Pain Frequency Constant   Aggravating Factors  pusing with legs   Pain Relieving Factors resting                         [facility] Adult PT Treatment/Exercise - 05/18/15 1636    Lumbar Exercises: Stretches   Passive Hamstring Stretch 2 reps;30 seconds   Single Knee to Chest Stretch 2 reps;30 seconds   Lower Trunk Rotation 2 reps;30 seconds   Lumbar Exercises: Aerobic   Stationary Bike NuStep L 5 x 10 min   Lumbar Exercises: Supine   Clam 15 reps  green theraband   Bent Knee Raise 20 reps   Straight Leg Raise 15 reps;1 second   Lumbar Exercises: Prone   Straight Leg Raise 5 reps   Other Prone Lumbar Exercises multifitus    Moist Heat Therapy   Number Minutes Moist Heat 10 Minutes   Moist Heat Location Lumbar Spine   Cryotherapy   Number Minutes Cryotherapy 10 Minutes   Cryotherapy Location Knee  Type of Cryotherapy Ice pack   Electrical Stimulation   Electrical Stimulation Location low back   Electrical Stimulation Action IFC   Electrical Stimulation Parameters 10   Electrical Stimulation Goals Pain   Manual Therapy   Soft tissue mobilization to lumbar paraspinals with instrument assisted STM   Myofascial Release IASTYM to lumbar bil multifdus, bil rectus femoris, vastus lateralis.  Left > Right sensitivity                PT Education - 05/18/15 1722    Education provided Yes   Education Details discussed benefits of getting a patellar brace for the knees    Person(s) Educated Patient   Methods Explanation   Comprehension Verbalized understanding          PT Short Term Goals - 04/03/15 6761    PT SHORT TERM GOAL #1   Title pt will be I with basice HEP (03/10/2015)   Status Achieved   PT SHORT TERM GOAL #2   Title pt will be bale to verbalize and demonstate tehcniques to  reduce low back pain via postural awareness, lifting and carrying mechanics, and HEP (03/10/2015)   Status Achieved           PT Long Term Goals - 05/18/15 1731    PT LONG TERM GOAL #1   Title upon discharge pt will be I with all HEP given throughout therapy (03/31/2015)   Time 6   Period Weeks   Status On-going   PT LONG TERM GOAL #2   Title She will be able to navigate >10 steps and walk or stand for > 30 minutes with < 2/10 pain to assist with walking 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 ascending  at 1/2 way decending stairs.   Time 6   Period Weeks   Status On-going   PT LONG TERM GOAL #3   Title pt will increase her lumbar mobility by > 10 degrees in all planes to help with ADLs (03/31/2015)   Baseline Pt increased all lumbar mobility within normal limits with 2/10 pain   Time 6   Period Weeks   Status Achieved   PT LONG TERM GOAL #4   Title She will demonstrate no lumbar or hip muscular spasm to assist with standing walking endurance and trunk mobility (03/31/2015)   Baseline Pt with tendernes on left psoas   Time 6   Period Weeks   Status Achieved   PT LONG TERM GOAL #5   Title She will increase her FOTO score to > 53 to demonstrate improved functional capacity upon discharge (03/31/2015)   Baseline improved to 50% limited on 05/04/2015   Time 6   Period Weeks   Status On-going               Plan - 05/18/15 1729    Clinical Impression Statement Melitta states she is doing ok but has had some increased pain and soreness in the knees and low back more recently. Focsued todays treatment on manual to decrease pain in the knees/ quads and decrease tension in the low back. Following todays sessions she reported decrease pain and not feeling as tense.    PT Next Visit Plan continue abdominal and multifidus exercises, hip strengthening, Manual for quads/ multifidus, Left psoas   and  dry needling, call about medial patellar stabilzer braces if script returned signed.  Consulted and Agree with Plan of Care Patient        Problem List Patient Active Problem List   Diagnosis Date Noted  . Aortic regurgitation due to bicuspid aortic valve 03/12/2014  . Bilateral lower extremity edema 03/12/2014  . Personal history of tobacco use, presenting hazards to health 01/20/2014  . Anxiety state, unspecified 10/16/2013  . Ileus ([place]) 07/08/2013  . Migraine 07/08/2013  . Sleep apnea 07/08/2013  . UTI (urinary tract infection) 07/08/2013  . IBS (irritable bowel syndrome) 06/06/2013  . GERD (gastroesophageal reflux disease) 06/06/2013  . HTN (hypertension) 06/06/2013   Starr Lake PT, DPT, LAT, ATC  05/18/2015  5:32 PM      [facility] [address] [place], [place], [address] Phone: [phone]   Fax:  [phone]  Name: Jay Kempe MRN: [id number] Date of Birth: 21-Mar-1981

## 2015-05-20 ENCOUNTER — Ambulatory Visit: Payer: No Typology Code available for payment source | Admitting: Cardiology

## 2015-05-27 ENCOUNTER — Ambulatory Visit: Payer: No Typology Code available for payment source | Attending: Family Medicine | Admitting: Family Medicine

## 2015-05-27 ENCOUNTER — Ambulatory Visit (HOSPITAL_COMMUNITY)
Admission: RE | Admit: 2015-05-27 | Discharge: 2015-05-27 | Disposition: A | Discharge status: Home / Self Care | Payer: No Typology Code available for payment source | Source: Ambulatory Visit | Attending: Family Medicine | Admitting: Family Medicine

## 2015-05-27 ENCOUNTER — Encounter: Payer: Self-pay | Admitting: Family Medicine

## 2015-05-27 ENCOUNTER — Ambulatory Visit: Payer: No Typology Code available for payment source | Admitting: Cardiology

## 2015-05-27 VITALS — BP 130/88 | HR 86 | Temp 98.3°F | Resp 28 | Ht 66.0 in | Wt 240.0 lb

## 2015-05-27 DIAGNOSIS — J4541 Moderate persistent asthma with (acute) exacerbation: Secondary | ICD-10-CM

## 2015-05-27 DIAGNOSIS — R0989 Other specified symptoms and signs involving the circulatory and respiratory systems: Secondary | ICD-10-CM | POA: Insufficient documentation

## 2015-05-27 DIAGNOSIS — R05 Cough: Secondary | ICD-10-CM | POA: Insufficient documentation

## 2015-05-27 DIAGNOSIS — J45909 Unspecified asthma, uncomplicated: Secondary | ICD-10-CM | POA: Insufficient documentation

## 2015-05-27 DIAGNOSIS — R0902 Hypoxemia: Secondary | ICD-10-CM | POA: Insufficient documentation

## 2015-05-27 DIAGNOSIS — J189 Pneumonia, unspecified organism: Secondary | ICD-10-CM | POA: Insufficient documentation

## 2015-05-27 LAB — POCT URINE PREGNANCY: Preg Test, Ur: NEGATIVE

## 2015-05-27 MED ORDER — AZITHROMYCIN 250 MG PO TABS: ORAL_TABLET | ORAL | Status: DC

## 2015-05-27 MED ORDER — METHYLPREDNISOLONE SODIUM SUCC 125 MG IJ SOLR
125.0000 mg | Freq: Once | INTRAMUSCULAR | Status: AC
  Administered 2015-05-27: 125 mg via INTRAMUSCULAR

## 2015-05-27 MED ORDER — CEFTRIAXONE SODIUM 500 MG IJ SOLR
500.0000 mg | Freq: Once | INTRAMUSCULAR | Status: AC
  Administered 2015-05-27: 500 mg via INTRAMUSCULAR

## 2015-05-27 MED ORDER — IPRATROPIUM-ALBUTEROL 0.5-2.5 (3) MG/3ML IN SOLN
3.0000 mL | Freq: Once | RESPIRATORY_TRACT | Status: AC
  Administered 2015-05-27: 3 mL via RESPIRATORY_TRACT

## 2015-05-27 NOTE — Progress Notes (Signed)
Pt c/o cough and cold sxs x3 days ago. Pt has history of asthma.  Pt states that her lungs hurt described as sharp pain rated 8/10. SOB.  Pt's has HA's itching in upper chest, sneezing and wheezing.  Pt reports taking neb tx at home this morning.  Pt was coughing up blood and mucus. Ringing in ears

## 2015-05-27 NOTE — Progress Notes (Signed)
Subjective:  Patient ID: Stephanie Miles, female    DOB: January 17, 1981  Age: 34 y.o. MRN: [id number]  CC: Cough   HPI Stephanie Miles is a 34 year old female with a history of asthma, hypertension, anxiety who presents today for an acute visit complaining of a three-day history of cough productive of brownish sputum tinged with blood, wheezing, shortness of breath, nasal congestion, jaw pain, otalgia and headaches. She denies fever or joint aches and has been using her Qvar and albuterol MDI along with nebulizer treatments with no relief in symptoms. At her last visit in 03/2015 with her PCP she had complained of uncontrolled asthma symptoms and the dose of her Qvar was increased which she states brought about any relief in symptoms until this current exacerbation which started 3 days ago.  She also had some anterior chest wall pain which radiates to her posterior chest wall and this is worse with breathing.  Outpatient Prescriptions Prior to Visit  Medication Sig Dispense Refill  . albuterol (PROVENTIL HFA;VENTOLIN HFA) 108 (90 BASE) MCG/ACT inhaler Inhale 2 puffs into the lungs every 6 (six) hours as needed for wheezing or shortness of breath. 3 Inhaler 3  . beclomethasone (QVAR) 80 MCG/ACT inhaler Inhale 2 puffs into the lungs 2 (two) times daily. 1 Inhaler 12  . cetirizine (ZYRTEC) 10 MG tablet Take 10 mg by mouth daily.    Marland Kitchen dicyclomine (BENTYL) 20 MG tablet Take 1 tablet (20 mg total) by mouth 4 (four) times daily -  before meals and at bedtime. 120 tablet 11  . fluticasone (FLONASE) 50 MCG/ACT nasal spray Place 2 sprays into both nostrils daily.    . furosemide (LASIX) 40 MG tablet Take 1 tablet (40 mg total) by mouth every morning. 30 tablet 3  . ketoconazole (NIZORAL) 2 % cream Apply 1 application topically 2 (two) times daily as needed for irritation. 30 g 2  . losartan (COZAAR) 100 MG tablet Take 1 tablet (100 mg total) by mouth daily. 30 tablet 5  . meloxicam (MOBIC) 7.5 MG tablet  Take 7.5 mg by mouth daily.    . mirtazapine (REMERON) 15 MG tablet Take 15 mg by mouth at bedtime.    . norgestimate-ethinyl estradiol (ORTHO-CYCLEN,SPRINTEC,PREVIFEM) 0.25-35 MG-MCG tablet Take 1 tablet by mouth every evening. 1 Package 11  . omeprazole (PRILOSEC) 20 MG capsule TAKE 1 CAPSULE BY MOUTH TWICE DAILY 60 capsule 11  . potassium chloride (K-DUR) 10 MEQ tablet TAKE 1 TABLET BY MOUTH 2 TIMES DAILY. (Patient taking differently: Patient states she takes 1 every other day) 60 tablet 3  . Prenatal Vit-Fe Sulfate-FA (PRENATAL VITAMIN PO) Take 1 tablet by mouth every evening.     . Probiotic Product (PROBIOTIC DAILY PO) Take 500 mg by mouth daily.     . traZODone (DESYREL) 100 MG tablet Take 100 mg by mouth at bedtime.    Marland Kitchen venlafaxine (EFFEXOR) 50 MG tablet Take 50 mg by mouth 2 (two) times daily with a meal.     . nystatin cream (MYCOSTATIN) Apply 1 application topically 2 (two) times daily. (Patient not taking: Reported on 05/27/2015) 30 g 1  . predniSONE (DELTASONE) 10 MG tablet Take 1 tablet (10 mg total) by mouth daily with breakfast. 6-5-4-3-2-1. Take 6 pills today and decrease by 1 pill per day (Patient not taking: Reported on 02/17/2015) 21 tablet 0   No facility-administered medications prior to visit.    ROS Review of Systems  Constitutional: Negative for activity change, appetite change and fatigue.  HENT:  See history of present illness  Eyes: Negative for visual disturbance.  Respiratory:       See history of present illness  Cardiovascular: Positive for chest pain. Negative for palpitations.  Gastrointestinal: Negative for abdominal pain, constipation and abdominal distention.  Endocrine: Negative for polydipsia.  Genitourinary: Negative for dysuria and frequency.  Musculoskeletal: Negative for back pain and arthralgias.  Skin: Negative for rash.  Neurological: Positive for headaches. Negative for tremors, light-headedness and numbness.  Hematological: Does not  bruise/bleed easily.  Psychiatric/Behavioral: Negative for behavioral problems and agitation.    Objective:  BP 130/88 mmHg  Pulse 86  Temp(Src) 98.3 F (36.8 C) (Oral)  Resp 28  Ht 5' 6"  (1.676 m)  Wt 240 lb (108.863 kg)  BMI 38.76 kg/m2  SpO2 86%  BP/Weight 05/27/2015 11/25/6642 0/09/4740  Systolic BP 595 638 756  Diastolic BP 88 80 99  Wt. (Lbs) 240 240 245  BMI 38.76 38.76 39.56    Lab Results  Component Value Date   WBC 11.1* 10/01/2013   HGB 14.1 10/01/2013   HCT 42.4 10/01/2013   PLT 308 10/01/2013   GLUCOSE 85 04/03/2015   ALT 42* 10/01/2013   AST 41* 10/01/2013   NA 142 04/03/2015   K 4.1 04/03/2015   CL 100 04/03/2015   CREATININE 0.86 04/03/2015   BUN 12 04/03/2015   CO2 31 04/03/2015   TSH 0.562 07/09/2013    Physical Exam  Constitutional: She is oriented to person, place, and time. She appears well-developed and well-nourished. No distress.  Acutely ill looking  HENT:  Mouth/Throat: Oropharynx is clear and moist.  Tenderness during autoscopy. Normal TM bilaterally.  Eyes: Conjunctivae and EOM are normal. Pupils are equal, round, and reactive to light.  Neck: Normal range of motion. No JVD present.  Cardiovascular: Normal rate, regular rhythm, normal heart sounds and intact distal pulses.  Exam reveals no gallop.   No murmur heard. Pulmonary/Chest: No respiratory distress. She has no wheezes. She has no rales. She exhibits no tenderness.  Tachypneic;Reduced air entry in bilateral lung bases.  Abdominal: Soft. Bowel sounds are normal. She exhibits no distension and no mass. There is no tenderness.  Musculoskeletal: Normal range of motion. She exhibits no edema or tenderness.  Neurological: She is alert and oriented to person, place, and time. She has normal reflexes.  Skin: Skin is warm and dry. She is not diaphoretic.  Psychiatric: She has a normal mood and affect.     Assessment & Plan:   1. Asthma, moderate persistent, with acute exacerbation   current exacerbation could be due to underlying pneumonia - methylPREDNISolone sodium succinate (SOLU-MEDROL) 125 mg/2 mL injection 125 mg; Inject 2 mLs (125 mg total) into the muscle once. - ipratropium-albuterol (DUONEB) 0.5-2.5 (3) MG/3ML nebulizer solution 3 mL; Take 3 mLs by nebulization once. - cefTRIAXone (ROCEPHIN) injection 500 mg; Inject 500 mg into the muscle once. - DG Chest 2 View; Future - azithromycin (ZITHROMAX) 250 MG tablet; 2 tabs (500 mg) by mouth on day 1 then 1 tab (250 mg) on days 2 to 5  Dispense: 6 each; Refill: 0 - POCT urine pregnancy  2. Bilateral pneumonia  I will treat presumptively for pneumonia and I will send off chest x-ray - DG Chest 2 View; Future - POCT urine pregnancy  3. Hypoxia  oxygen saturation is low at 86%.  Repeat oxygen saturation after nebulizer treatment and steroids improved to 97%   Meds ordered this encounter  Medications  . methylPREDNISolone sodium succinate (  SOLU-MEDROL) 125 mg/2 mL injection 125 mg    Sig:   . ipratropium-albuterol (DUONEB) 0.5-2.5 (3) MG/3ML nebulizer solution 3 mL    Sig:   . cefTRIAXone (ROCEPHIN) injection 500 mg    Sig:   . azithromycin (ZITHROMAX) 250 MG tablet    Sig: 2 tabs (500 mg) by mouth on day 1 then 1 tab (250 mg) on days 2 to 5    Dispense:  6 each    Refill:  0    Follow-up:  1 week for follow-up of pneumonia with PCP Arnoldo Morale MD

## 2015-05-27 NOTE — Patient Instructions (Signed)

## 2015-05-28 ENCOUNTER — Ambulatory Visit
Payer: No Typology Code available for payment source | Attending: Physical Medicine and Rehabilitation | Admitting: Physical Therapy

## 2015-05-28 DIAGNOSIS — M2560 Stiffness of unspecified joint, not elsewhere classified: Secondary | ICD-10-CM | POA: Insufficient documentation

## 2015-05-28 DIAGNOSIS — M545 Low back pain: Secondary | ICD-10-CM | POA: Insufficient documentation

## 2015-05-28 DIAGNOSIS — M25561 Pain in right knee: Secondary | ICD-10-CM | POA: Insufficient documentation

## 2015-05-28 DIAGNOSIS — M25562 Pain in left knee: Secondary | ICD-10-CM | POA: Insufficient documentation

## 2015-05-28 DIAGNOSIS — R293 Abnormal posture: Secondary | ICD-10-CM | POA: Insufficient documentation

## 2015-05-28 DIAGNOSIS — M62830 Muscle spasm of back: Secondary | ICD-10-CM | POA: Insufficient documentation

## 2015-05-29 ENCOUNTER — Ambulatory Visit: Payer: No Typology Code available for payment source | Admitting: Family Medicine

## 2015-06-01 ENCOUNTER — Ambulatory Visit: Payer: No Typology Code available for payment source | Admitting: Physical Therapy

## 2015-06-01 DIAGNOSIS — M25561 Pain in right knee: Secondary | ICD-10-CM

## 2015-06-01 DIAGNOSIS — R293 Abnormal posture: Secondary | ICD-10-CM

## 2015-06-01 DIAGNOSIS — M545 Low back pain, unspecified: Secondary | ICD-10-CM

## 2015-06-01 DIAGNOSIS — M2560 Stiffness of unspecified joint, not elsewhere classified: Secondary | ICD-10-CM

## 2015-06-01 DIAGNOSIS — M62830 Muscle spasm of back: Secondary | ICD-10-CM

## 2015-06-01 DIAGNOSIS — M25562 Pain in left knee: Secondary | ICD-10-CM

## 2015-06-01 NOTE — Therapy (Addendum)
[place], [place], [address] Phone: [phone]   Fax:  [phone]  Physical Therapy Treatment / Re-certification  Patient Details  Name: Stephanie Miles MRN: [id number] Date of Birth: 10-17-1980 No Data Recorded  Encounter Date: 06/01/2015      PT End of Session - 06/01/15 1705    Visit Number 15   Number of Visits 16   Date for PT Re-Evaluation 06/22/15   Authorization Type GCCN   PT Start Time 0430   PT Stop Time 0515   PT Time Calculation (min) 45 min   Activity Tolerance Patient tolerated treatment well   Behavior During Therapy [facility] for tasks assessed/performed      Past Medical History  Diagnosis Date  . Hypertension   . GERD (gastroesophageal reflux disease)   . IBS (irritable bowel syndrome)   . Migraine headache   . S/P appy 05/2013  . Anxiety   . Allergy   . Depression   . Heart murmur   . Chronic kidney disease     kidney stones  . Asthma   . Aortic regurgitation due to bicuspid aortic valve     Past Surgical History  Procedure Laterality Date  . Dental surgery    . Laparoscopic appendectomy N/A 06/06/2013    Procedure: APPENDECTOMY LAPAROSCOPIC;  Surgeon: Imogene Burn. Georgette Dover, MD;  Location: [place];  Service: General;  Laterality: N/A;  . Appendectomy      There were no vitals filed for this visit.  Visit Diagnosis:  Midline low back pain without sciatica - Plan: PT plan of care cert/re-cert  Joint stiffness of spine - Plan: PT plan of care cert/re-cert  Bilateral knee pain - Plan: PT plan of care cert/re-cert  Abnormal posture - Plan: PT plan of care cert/re-cert  Muscle spasm of back - Plan: PT plan of care cert/re-cert      Subjective Assessment - 06/01/15 1624    Subjective "I was missing the last visit because I caught pneumonia" pt reports that she is having some difficulty today   Currently in Pain? Yes   Pain Score 2    Pain Location Back   Pain  Orientation Left;Right;Upper   Pain Type Chronic pain   Pain Onset More than a month ago   Pain Frequency Intermittent   Aggravating Factors  prolonged walking/ standing   Pain Score 4  walking stairs 6 /10   Pain Location Knee   Pain Orientation Left;Right   Pain Descriptors / Indicators Aching   Pain Type Chronic pain   Pain Onset More than a month ago   Pain Frequency Constant            [facility] PT Assessment - 06/01/15 0001    Observation/Other Assessments   Focus on Therapeutic Outcomes (FOTO)  46% limited   AROM   Right Knee Extension 0   Right Knee Flexion 135   Left Knee Extension 0   Left Knee Flexion 132   Lumbar Flexion 110   Lumbar Extension 30   Lumbar - Right Side Bend 40   Lumbar - Left Side Bend 40   Lumbar - Right Rotation 100%   Lumbar - Left Rotation 100%   Strength   Right Hip Flexion 5/5   Right Hip Extension 5/5   Right Hip ABduction 4+/5   Right Hip ADduction 4+/5   Left Hip Flexion 4+/5   Left Hip Extension 4+/5   Left Hip ABduction 4+/5   Left  Hip ADduction 4+/5   Right Knee Flexion 5/5   Right Knee Extension 5/5   Left Knee Flexion 5/5  pain during testing   Left Knee Extension 5/5  pain during testing                     [facility] Adult PT Treatment/Exercise - 06/01/15 1633    Lumbar Exercises: Stretches   Passive Hamstring Stretch 2 reps;30 seconds   Single Knee to Chest Stretch 2 reps;30 seconds   Lower Trunk Rotation 2 reps;30 seconds   Lumbar Exercises: Aerobic   Stationary Bike NuStep L 5 x 10 min   Lumbar Exercises: Supine   Clam 15 reps   Bent Knee Raise 20 reps   Straight Leg Raise 15 reps;1 second                PT Education - 06/01/15 1704    Education provided Yes   Education Details discussed POC and progress   Person(s) Educated Patient   Methods Explanation   Comprehension Verbalized understanding          PT Short Term Goals - 06/01/15 1650    PT SHORT TERM GOAL #1   Title pt will be  I with basice HEP (03/10/2015)   Time 3   Period Weeks   Status Achieved   PT SHORT TERM GOAL #2   Title pt will be bale to verbalize and demonstate tehcniques to reduce low back pain via postural awareness, lifting and carrying mechanics, and HEP (03/10/2015)   Time 3   Period Weeks   Status Achieved           PT Long Term Goals - 06/01/15 1651    PT LONG TERM GOAL #1   Title upon discharge pt will be I with all HEP given throughout therapy (03/31/2015)   Time 6   Period Weeks   Status On-going   PT LONG TERM GOAL #2   Title She will be able to navigate >10 steps and walk or stand for > 30 minutes with < 2/10 pain to assist with walking 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 ascending  at 1/2 way decending stairs.   Time 6   Period Weeks   Status Achieved   PT LONG TERM GOAL #3   Title pt will increase her lumbar mobility by > 10 degrees in all planes to help with ADLs (03/31/2015)   Baseline Pt increased all lumbar mobility within normal limits with 2/10 pain   Time 6   Period Weeks   Status Achieved   PT LONG TERM GOAL #4   Title She will demonstrate no lumbar or hip muscular spasm to assist with standing walking endurance and trunk mobility (03/31/2015)   Baseline Pt with tendernes on left psoas   Time 6   Period Weeks   Status Achieved   PT LONG TERM GOAL #5   Title She will increase her FOTO score to > 53 to demonstrate improved functional capacity upon discharge (03/31/2015)   Baseline improved to 50% limited on 05/04/2015   Time 6   Period Weeks   Status Achieved               Plan - 06/01/15 1705    Clinical Impression Statement Modelle reports to therapy stating that she is doing much better in her knees and  low back. She has improved her trunk mobility and LE strengthening with some mild deficit in the L hip flexor and abductors. She improved her FOTO score and has  met all goals except for LTG #1. provided script for bil patellar stabilizing braces and provided cards to multiple bracing companies. Plan to see pt for 1 visit in 3 weeks to assess how she is doing with the braces and D/C after and pt agrees with this.    Pt will benefit from skilled therapeutic intervention in order to improve on the following deficits Pain;Improper body mechanics;Postural dysfunction;Decreased mobility;Decreased activity tolerance;Decreased endurance;Decreased range of motion;Decreased strength;Hypomobility;Decreased balance   PT Frequency One time visit   PT Duration 3 weeks   PT Next Visit Plan assess patellar braces and pain inthe knees / low back. D/C   PT Home Exercise Plan No new HEP given.   Consulted and Agree with Plan of Care Patient        Problem List Patient Active Problem List   Diagnosis Date Noted  . Asthma 05/27/2015  . Aortic regurgitation due to bicuspid aortic valve 03/12/2014  . Bilateral lower extremity edema 03/12/2014  . Personal history of tobacco use, presenting hazards to health 01/20/2014  . Anxiety state, unspecified 10/16/2013  . Ileus ([place]) 07/08/2013  . Migraine 07/08/2013  . Sleep apnea 07/08/2013  . UTI (urinary tract infection) 07/08/2013  . IBS (irritable bowel syndrome) 06/06/2013  . GERD (gastroesophageal reflux disease) 06/06/2013  . HTN (hypertension) 06/06/2013   Starr Lake PT, DPT, LAT, ATC  06/01/2015  5:16 PM    [facility] [address] [place], [place], [address] Phone: [phone]   Fax:  [phone]  Name: Stephanie Miles MRN: [id number] Date of Birth: January 29, 1981     PHYSICAL THERAPY DISCHARGE SUMMARY  Visits from Start of Care: 15  Current functional level related to goals / functional outcomes: FOTO 54% limited   Remaining deficits: Intermittent low back pain and tightness. Intermittent bil knee pain.    Education / Equipment: HEP,  theraband for strengthening  Plan: Patient agrees to discharge.  Patient goals were partially met. Patient is being discharged due to not returning since the last visit.  ?????        Gurkirat Basher PT, DPT, LAT, ATC  07/09/2015  1:54 PM

## 2015-06-02 NOTE — Addendum Note (Signed)
Addended by: Larey Days on: 06/02/2015 01:55 PM   Modules accepted: Orders

## 2015-06-03 ENCOUNTER — Ambulatory Visit: Payer: No Typology Code available for payment source | Admitting: Cardiology

## 2015-06-05 ENCOUNTER — Telehealth: Payer: Self-pay

## 2015-06-05 NOTE — Telephone Encounter (Signed)
-----   Message from Arnoldo Morale, MD sent at 05/28/2015  1:56 PM EDT ----- Chest x-ray is normal.

## 2015-06-05 NOTE — Telephone Encounter (Signed)
CMA called patient, patient wasn't available to take my call. I left message asking patient to call me asap.

## 2015-06-05 NOTE — Telephone Encounter (Signed)
Patient return my call. Patient verified name and DOB. Patient was given her lab results with no further questions.

## 2015-06-08 ENCOUNTER — Encounter: Payer: No Typology Code available for payment source | Admitting: Physical Therapy

## 2015-06-10 ENCOUNTER — Encounter: Payer: Self-pay | Admitting: Internal Medicine

## 2015-06-10 ENCOUNTER — Ambulatory Visit (HOSPITAL_BASED_OUTPATIENT_CLINIC_OR_DEPARTMENT_OTHER): Payer: BLUE CROSS/BLUE SHIELD | Admitting: Cardiology

## 2015-06-10 ENCOUNTER — Encounter: Payer: Self-pay | Admitting: Cardiology

## 2015-06-10 ENCOUNTER — Ambulatory Visit: Payer: BLUE CROSS/BLUE SHIELD | Attending: Internal Medicine | Admitting: Internal Medicine

## 2015-06-10 ENCOUNTER — Telehealth: Payer: Self-pay | Admitting: Clinical

## 2015-06-10 VITALS — BP 136/88 | HR 102 | Temp 98.3°F | Resp 17 | Ht 66.0 in | Wt 247.0 lb

## 2015-06-10 VITALS — BP 144/86 | HR 101 | Temp 98.8°F | Resp 20 | Ht 66.0 in | Wt 247.0 lb

## 2015-06-10 DIAGNOSIS — G4730 Sleep apnea, unspecified: Secondary | ICD-10-CM | POA: Diagnosis not present

## 2015-06-10 DIAGNOSIS — I129 Hypertensive chronic kidney disease with stage 1 through stage 4 chronic kidney disease, or unspecified chronic kidney disease: Secondary | ICD-10-CM | POA: Insufficient documentation

## 2015-06-10 DIAGNOSIS — F329 Major depressive disorder, single episode, unspecified: Secondary | ICD-10-CM | POA: Diagnosis not present

## 2015-06-10 DIAGNOSIS — Q231 Congenital insufficiency of aortic valve: Secondary | ICD-10-CM

## 2015-06-10 DIAGNOSIS — F419 Anxiety disorder, unspecified: Secondary | ICD-10-CM | POA: Insufficient documentation

## 2015-06-10 DIAGNOSIS — Z79899 Other long term (current) drug therapy: Secondary | ICD-10-CM | POA: Insufficient documentation

## 2015-06-10 DIAGNOSIS — Z8249 Family history of ischemic heart disease and other diseases of the circulatory system: Secondary | ICD-10-CM | POA: Insufficient documentation

## 2015-06-10 DIAGNOSIS — K219 Gastro-esophageal reflux disease without esophagitis: Secondary | ICD-10-CM | POA: Diagnosis not present

## 2015-06-10 DIAGNOSIS — Z87891 Personal history of nicotine dependence: Secondary | ICD-10-CM | POA: Insufficient documentation

## 2015-06-10 DIAGNOSIS — Z0000 Encounter for general adult medical examination without abnormal findings: Secondary | ICD-10-CM

## 2015-06-10 DIAGNOSIS — Z825 Family history of asthma and other chronic lower respiratory diseases: Secondary | ICD-10-CM | POA: Insufficient documentation

## 2015-06-10 DIAGNOSIS — R600 Localized edema: Secondary | ICD-10-CM

## 2015-06-10 DIAGNOSIS — J4541 Moderate persistent asthma with (acute) exacerbation: Secondary | ICD-10-CM

## 2015-06-10 DIAGNOSIS — N189 Chronic kidney disease, unspecified: Secondary | ICD-10-CM | POA: Diagnosis not present

## 2015-06-10 DIAGNOSIS — R609 Edema, unspecified: Secondary | ICD-10-CM | POA: Insufficient documentation

## 2015-06-10 DIAGNOSIS — Z833 Family history of diabetes mellitus: Secondary | ICD-10-CM | POA: Insufficient documentation

## 2015-06-10 NOTE — Progress Notes (Signed)
Patient here for yearly follow-up  Patient denies pain at this time. Patient states she has chronic knee and back pain.  Patient relates SOB and Wheezing to asthma.  Patient complains of slight swelling in ankles. Patient states swelling has been much better since the change of her medications last august.  Patient would like a flu shot today.

## 2015-06-10 NOTE — Assessment & Plan Note (Signed)
She is asymptomatic. Her physical examination is not changed. We will repeat echocardiogram in March 2017 for two-year follow-up. I'll see her back in the clinic in one year unless there is a significant change on her echo. All questions answered and patient is comfortable with this approach. Her lower extremity edema improved coming off of amlodipine and losarten. No changes made today.

## 2015-06-10 NOTE — Progress Notes (Signed)
Patient here for follow up from her upper respiratory illness Patient was seen by dr Jarold Song Patient states she is feeling much better

## 2015-06-10 NOTE — Patient Instructions (Addendum)
Thank you for visiting with Dr. Verl Blalock today. ECHOCARDIOGRAM has been ordered. Appointment should be made for the month of March 2017.

## 2015-06-10 NOTE — Assessment & Plan Note (Signed)
This is improved off of amlodipine. She still has 1+ pitting edema which is probably related to her weight and nonsteroidal anti-inflammatory use.

## 2015-06-10 NOTE — Progress Notes (Signed)
Ms Finks returns today for the evaluation and management and follow-up of her bicuspid aortic valve detected on echocardiogram in March 2015 and 2+ aortic regurgitation. I saw her last August at which time she was asymptomatic. She did have lower extremity edema which I attributed to amlodipine. I changed this to losartan and her edema improved. She is also on chronic nonsteroidal anti-inflammatory therapy.  She denies any symptoms of palpitations, syncope or presyncope, orthopnea, PND. Her edema has improved. She does have some chronic shortness of breath that is no different.  Her exam today reveals her to be in no acute distress. Neck shows no JVD. Lungs are clear to auscultation. Heart reveals a poorly appreciated PMI. She has a normal S1-S2 with a 3/6 diastolic blowing murmur along the left sternal border. There is no obvious gallop. Extremities reveal 1+ pretibial edema. Pulses are intact.

## 2015-06-10 NOTE — Progress Notes (Signed)
Patient ID: Stephanie Miles, female   DOB: 1980/08/01, 34 y.o.   MRN: [id number]  CC: URI f/u  HPI: Stephanie Miles is a 34 y.o. female here today for a follow up visit.  Patient has past medical history of HTN, GERD, Asthma, sleep apnea, and depression. Patient was seen 14 days ago for a upper respiratory tract infection. At that time patient was thought to have pneumonia and was treated with a course of azithromycin. Her chest xray later resulted as within normal limits. She states that she now feels much better but has been having some dual difficulty with her asthma. Since her last increase in dose of Qvar she notes that she still has to use her albuterol inhaler daily and has wheezing upon awakening. She denies any night time awakenings.    Allergies  Allergen Reactions  . Codeine Nausea Only   Past Medical History  Diagnosis Date  . Hypertension   . GERD (gastroesophageal reflux disease)   . IBS (irritable bowel syndrome)   . Migraine headache   . S/P appy 05/2013  . Anxiety   . Allergy   . Depression   . Heart murmur   . Chronic kidney disease     kidney stones  . Asthma   . Aortic regurgitation due to bicuspid aortic valve    Current Outpatient Prescriptions on File Prior to Visit  Medication Sig Dispense Refill  . albuterol (PROVENTIL HFA;VENTOLIN HFA) 108 (90 BASE) MCG/ACT inhaler Inhale 2 puffs into the lungs every 6 (six) hours as needed for wheezing or shortness of breath. 3 Inhaler 3  . beclomethasone (QVAR) 80 MCG/ACT inhaler Inhale 2 puffs into the lungs 2 (two) times daily. 1 Inhaler 12  . cetirizine (ZYRTEC) 10 MG tablet Take 10 mg by mouth daily.    Marland Kitchen dicyclomine (BENTYL) 20 MG tablet Take 1 tablet (20 mg total) by mouth 4 (four) times daily -  before meals and at bedtime. 120 tablet 11  . fluticasone (FLONASE) 50 MCG/ACT nasal spray Place 2 sprays into both nostrils daily.    Marland Kitchen losartan (COZAAR) 100 MG tablet Take 1 tablet (100 mg total) by mouth daily. 30  tablet 5  . meloxicam (MOBIC) 7.5 MG tablet Take 7.5 mg by mouth daily.    . mirtazapine (REMERON) 15 MG tablet Take 15 mg by mouth at bedtime.    . norgestimate-ethinyl estradiol (ORTHO-CYCLEN,SPRINTEC,PREVIFEM) 0.25-35 MG-MCG tablet Take 1 tablet by mouth every evening. 1 Package 11  . omeprazole (PRILOSEC) 20 MG capsule TAKE 1 CAPSULE BY MOUTH TWICE DAILY 60 capsule 11  . potassium chloride (K-DUR) 10 MEQ tablet TAKE 1 TABLET BY MOUTH 2 TIMES DAILY. (Patient taking differently: Patient states she takes 1 every other day) 60 tablet 3  . Probiotic Product (PROBIOTIC DAILY PO) Take 500 mg by mouth daily.     . traZODone (DESYREL) 100 MG tablet Take 100 mg by mouth at bedtime.    Marland Kitchen venlafaxine (EFFEXOR) 50 MG tablet Take 50 mg by mouth 2 (two) times daily with a meal.     . azithromycin (ZITHROMAX) 250 MG tablet 2 tabs (500 mg) by mouth on day 1 then 1 tab (250 mg) on days 2 to 5 (Patient not taking: Reported on 06/10/2015) 6 each 0  . furosemide (LASIX) 40 MG tablet Take 1 tablet (40 mg total) by mouth every morning. 30 tablet 3  . ketoconazole (NIZORAL) 2 % cream Apply 1 application topically 2 (two) times daily as needed for irritation. 30 g  2  . nystatin cream (MYCOSTATIN) Apply 1 application topically 2 (two) times daily. (Patient not taking: Reported on 06/10/2015) 30 g 1  . Prenatal Vit-Fe Sulfate-FA (PRENATAL VITAMIN PO) Take 1 tablet by mouth every evening.     . [DISCONTINUED] potassium chloride (K-DUR,KLOR-CON) 10 MEQ tablet Take 1 tablet (10 mEq total) by mouth 2 (two) times daily. 30 tablet 3   No current facility-administered medications on file prior to visit.   Family History  Problem Relation Age of Onset  . COPD Mother   . Hypertension Mother   . Cancer Father   . Diabetes Father   . Hypertension Father   . Heart disease Father   . Hyperlipidemia Father   . Asthma Sister   . Hyperlipidemia Brother   . Hypertension Brother   . Vision loss Brother   . Colon cancer  Maternal Grandmother   . Esophageal cancer Neg Hx   . Pancreatic cancer Neg Hx   . Rectal cancer Neg Hx   . Stomach cancer Neg Hx    Social History   Social History  . Marital Status: Single    Spouse Name: N/A  . Number of Children: N/A  . Years of Education: N/A   Occupational History  . Not on file.   Social History Main Topics  . Smoking status: Former Smoker -- 1.00 packs/day for 20 years    Quit date: 02/22/2013  . Smokeless tobacco: Never Used  . Alcohol Use: Yes     Comment: social  . Drug Use: No  . Sexual Activity: Not on file   Other Topics Concern  . Not on file   Social History Narrative    Review of Systems: Other than what is stated in HPI, all other systems are negative.   Objective:   Filed Vitals:   06/10/15 1500  BP: 136/88  Pulse: 102  Temp: 98.3 F (36.8 C)  Resp: 17    Physical Exam  Constitutional: She is oriented to person, place, and time.  Cardiovascular: Normal rate, regular rhythm and normal heart sounds.   Pulmonary/Chest: Effort normal and breath sounds normal. She has no wheezes.  Neurological: She is alert and oriented to person, place, and time.  Skin: Skin is dry.  Psychiatric: She has a normal mood and affect.     Lab Results  Component Value Date   WBC 11.1* 10/01/2013   HGB 14.1 10/01/2013   HCT 42.4 10/01/2013   MCV 84.8 10/01/2013   PLT 308 10/01/2013   Lab Results  Component Value Date   CREATININE 0.86 04/03/2015   BUN 12 04/03/2015   NA 142 04/03/2015   K 4.1 04/03/2015   CL 100 04/03/2015   CO2 31 04/03/2015    No results found for: HGBA1C Lipid Panel  No results found for: CHOL, TRIG, HDL, CHOLHDL, VLDL, LDLCALC     Assessment and plan:   [name] was seen today for follow-up.  Diagnoses and all orders for this visit:  Asthma, moderate persistent, with acute exacerbation -     Ambulatory referral to Pulmonology Will refer patient out for additional treatment and PFT's.   Return if  symptoms worsen or fail to improve.       Lance Bosch, [facility] [phone] 06/10/2015, 3:57 PM

## 2015-06-10 NOTE — Telephone Encounter (Signed)
Follow-up with Stephanie Miles, informed her she will be mailed a list of DME locations in the [facility] area, and she agrees.

## 2015-06-22 ENCOUNTER — Ambulatory Visit: Payer: No Typology Code available for payment source | Admitting: Physical Therapy

## 2015-07-06 ENCOUNTER — Encounter: Payer: Self-pay | Admitting: Internal Medicine

## 2015-07-06 ENCOUNTER — Ambulatory Visit (INDEPENDENT_AMBULATORY_CARE_PROVIDER_SITE_OTHER): Payer: No Typology Code available for payment source | Admitting: Internal Medicine

## 2015-07-06 VITALS — BP 140/90 | HR 87 | Ht 66.0 in | Wt 249.0 lb

## 2015-07-06 DIAGNOSIS — J45991 Cough variant asthma: Secondary | ICD-10-CM

## 2015-07-06 DIAGNOSIS — R0600 Dyspnea, unspecified: Secondary | ICD-10-CM

## 2015-07-06 DIAGNOSIS — R0602 Shortness of breath: Secondary | ICD-10-CM

## 2015-07-06 MED ORDER — MOMETASONE FURO-FORMOTEROL FUM 100-5 MCG/ACT IN AERO: INHALATION_SPRAY | RESPIRATORY_TRACT | Status: DC

## 2015-07-06 NOTE — Progress Notes (Signed)
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 Pulmonary Consult    Referred by Dr. Feliciana Rossetti. Pt states that she was dxed with Asthma in Sept 2015. She has had chest tightness, cough, wheezing and SOB off and on since then. She states that her breathing is doing fine today "because it's raining".    each am wakes up wheezing x 1.5 y then uses albuterol and better plus qvar maint and additional saba  at least once or twice daily esp with exertion then sleeps on cpap with sometime noct cough   No obvious  patterns in day to day or daytime variabilty or assoc  excess/ purulent sputum or mucus plugs   or cp or chest tightness, subjective wheeze overt sinus or hb symptoms. No unusual exp hx or h/o childhood pna/ asthma or knowledge of premature birth.  Sleeping ok without nocturnal  or early am exacerbation  of respiratory  c/o's or need for noct saba. Also denies any obvious fluctuation of symptoms with weather or environmental changes or other aggravating or alleviating factors except as outlined above   Current Medications, Allergies, Complete Past Medical History, Past Surgical History, Family History, and Social History were reviewed in [employer] record.           Review of Systems  Constitutional: Negative for fever, chills and unexpected weight change.  HENT: Positive for congestion. Negative for dental problem, ear pain, nosebleeds, postnasal drip, rhinorrhea, sinus pressure, sneezing, sore throat, trouble swallowing and voice change.   Eyes: Negative for visual disturbance.  Respiratory: Positive for cough and shortness of  breath. Negative for choking.   Cardiovascular: Negative for chest pain and leg swelling.  Gastrointestinal: Positive for abdominal pain. Negative for vomiting and diarrhea.  Genitourinary: Negative for difficulty urinating.  Musculoskeletal: Negative for arthralgias.  Skin: Negative for rash.  Neurological: Positive for headaches. Negative for tremors and syncope.  Hematological: Does not bruise/bleed easily.       Objective:   Physical Exam  amb wf nad prominent pseudowheeze  Wt Readings from Last 3 Encounters:  07/06/15 249 lb (112.946 kg)  06/10/15 247 lb (112.038 kg)  06/10/15 247 lb (112.038 kg)    Vital signs reviewed   HEENT: nl dentition, turbinates, and oropharynx. Nl external ear canals without cough reflex   NECK :  without JVD/Nodes/TM/ nl carotid upstrokes bilaterally   LUNGS: no acc muscle use,  Nl contour chest which is clear to A and P bilaterally without cough on insp or exp maneuvers   CV:  RRR  no s3 or murmur or increase in P2, no edema   ABD:  soft and nontender with nl inspiratory excursion in the supine position. No bruits or organomegaly, bowel sounds nl  MS:  Nl gait/ ext warm without deformities, calf tenderness, cyanosis or clubbing No obvious joint restrictions   SKIN: warm and dry without lesions    NEURO:  alert, approp, nl sensorium with  no motor deficits    I personally reviewed images and agree with radiology impression as follows:  CXR:  05/27/15 No acute cardiopulmonary disease.            Assessment & Plan:

## 2015-07-06 NOTE — Patient Instructions (Addendum)
Try taking prilosec x 2 x  30 min before first meal  and pepcid 20 mg at bedtime until return   Stop qvar   If your breathing gets worse, try dulera 100 2 puffs every 12 hours on a trial basis   Work on inhaler technique:  relax and gently blow all the way out then take a nice smooth deep breath back in, triggering the inhaler at same time you start breathing in.  Hold for up to 5 seconds if you can. Blow out thru nose. Rinse and gargle with water when done      Only use your albuterol as a rescue medication to be used if you can't catch your breath by resting or doing a relaxed purse lip breathing pattern.  - The less you use it, the better it will work when you need it. - Ok to use up to 2 puffs  every 4 hours if you must but call for immediate appointment if use goes up over your usual need - Don't leave home without it !!  (think of it like the spare tire for your car)   GERD (REFLUX)  is an extremely common cause of respiratory symptoms just like yours , many times with no obvious heartburn at all.    It can be treated with medication, but also with lifestyle changes including elevation of the head of your bed (ideally with 6 inch  bed blocks),  Smoking cessation, avoidance of late meals, excessive alcohol, and avoid fatty foods, chocolate, peppermint, colas, red wine, and acidic juices such as orange juice.  NO MINT OR MENTHOL PRODUCTS SO NO COUGH DROPS  USE SUGARLESS CANDY INSTEAD (Jolley ranchers or Stover's or Life Savers) or even ice chips will also do - the key is to swallow to prevent all throat clearing. NO OIL BASED VITAMINS - use powdered substitutes.    Please schedule a follow up office visit in 4 weeks, sooner if needed

## 2015-07-07 ENCOUNTER — Encounter: Payer: Self-pay | Admitting: Internal Medicine

## 2015-07-07 DIAGNOSIS — J45991 Cough variant asthma: Secondary | ICD-10-CM | POA: Insufficient documentation

## 2015-07-07 DIAGNOSIS — R0600 Dyspnea, unspecified: Secondary | ICD-10-CM

## 2015-07-07 HISTORY — DX: Cough variant asthma: J45.991

## 2015-07-07 HISTORY — DX: Morbid (severe) obesity due to excess calories: E66.01

## 2015-07-07 HISTORY — DX: Dyspnea, unspecified: R06.00

## 2015-07-07 NOTE — Assessment & Plan Note (Signed)
Spirometry 07/06/2015   Purely restrictive so rec trial off qvar and rx dulera 100 2bid for flare off qvar    Not really clear at all this is asthma vs  Classic Upper airway cough syndrome, so named because it's frequently impossible to sort out how much is  CR/sinusitis with freq throat clearing (which can be related to primary GERD)   vs  causing  secondary (" extra esophageal")  GERD from wide swings in gastric pressure that occur with throat clearing, often  promoting self use of mint and menthol lozenges that reduce the lower esophageal sphincter tone and exacerbate the problem further in a cyclical fashion.   These are the same pts (now being labeled as having "irritable larynx syndrome" by some cough centers) who not infrequently have a history of having failed to tolerate ace inhibitors,  dry powder inhalers or biphosphonates or report having atypical reflux symptoms that don't respond to standard doses of PPI , and are easily confused as having aecopd or asthma flares by even experienced allergists/ pulmonologists.   For now rec leave off maint rx and just use prn saba while on max rx for gerd and see if flares and if so try dulera 100 2bid as easier to start/ stop than qvar which has such a delayed response it's not particularly effective in empirical trials like this as benefit or lack thereof is so delayed   - The proper method of use, as well as anticipated side effects, of a metered-dose inhaler are discussed and demonstrated to the patient. Improved effectiveness after extensive coaching during this visit to a level of approximately 75 % from a baseline of 25 %    I had an extended discussion with the patient reviewing all relevant studies completed to date and  lasting  35 minutes of a 60 minute visit    Each maintenance medication was reviewed in detail including most importantly the difference between maintenance and prns and under what circumstances the prns are to be triggered using  an action plan format that is not reflected in the computer generated alphabetically organized AVS.    Please see instructions for details which were reviewed in writing and the patient given a copy highlighting the part that I personally wrote and discussed at today's ov.

## 2015-07-07 NOTE — Assessment & Plan Note (Signed)
Spirometry suggests this is a restrictive pattern ie wt related

## 2015-07-07 NOTE — Assessment & Plan Note (Signed)
Body mass index is 40.21 kg/(m^2).  Lab Results  Component Value Date   TSH 0.562 07/09/2013     Contributing to gerd tendency/ doe/reviewed the need and the process to achieve and maintain neg calorie balance > defer f/u primary care including intermittently monitoring thyroid status

## 2015-07-28 MED FILL — MONO-LINYAH 28 TABLET: 0.25-35 | 21 days supply | Qty: 28 | Fill #6

## 2015-07-28 MED FILL — ?OMEPRAZOLE DR 20 MG CAPSUL: 20 | 30 days supply | Qty: 60 | Fill #5

## 2015-07-28 MED FILL — traZODone HCL 50 MG TABS: 50 | 30 days supply | Qty: 60 | Fill #0

## 2015-08-04 ENCOUNTER — Ambulatory Visit: Payer: BLUE CROSS/BLUE SHIELD | Attending: Internal Medicine

## 2015-08-11 ENCOUNTER — Encounter: Payer: Self-pay | Admitting: Internal Medicine

## 2015-08-11 ENCOUNTER — Ambulatory Visit (INDEPENDENT_AMBULATORY_CARE_PROVIDER_SITE_OTHER): Payer: No Typology Code available for payment source | Admitting: Internal Medicine

## 2015-08-11 VITALS — BP 122/90 | HR 83 | Ht 66.0 in | Wt 249.0 lb

## 2015-08-11 DIAGNOSIS — J45991 Cough variant asthma: Secondary | ICD-10-CM

## 2015-08-11 MED ORDER — OMEPRAZOLE 20 MG PO CPDR: DELAYED_RELEASE_CAPSULE | ORAL | Status: DC

## 2015-08-11 MED FILL — POTASSIUM CL 10 MEQ TAB SA: 10 | 30 days supply | Qty: 60 | Fill #2

## 2015-08-11 MED FILL — MELOXICAM 15 MG TABLET: 15 | 30 days supply | Qty: 30 | Fill #5

## 2015-08-11 MED FILL — LOSARTAN POTASSIUM 100 MG T: 100 | 30 days supply | Qty: 30 | Fill #4

## 2015-08-11 MED FILL — ?FUROSEMIDE 40 MG TABLET: 40 | 30 days supply | Qty: 30 | Fill #3

## 2015-08-11 NOTE — Progress Notes (Signed)
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 Pulmonary Consult    Referred by Dr. Feliciana Rossetti. Pt states that she was dxed with Asthma in Sept 2015. She has had chest tightness, cough, wheezing and SOB off and on since then. She states that her breathing is doing fine today "because it's raining".    each am wakes up wheezing x 1.5 y then uses albuterol and better plus qvar maint and additional saba  at least once or twice daily esp with exertion then sleeps on cpap with sometime noct cough  rec Try taking prilosec x 2 x  30 min before first meal  and pepcid 20 mg at bedtime until return  Stop qvar  If your breathing gets worse, try dulera 100 2 puffs every 12 hours on a trial basis  Work on inhaler technique:   Only use your albuterol as a rescue medication  GERD  diet    08/11/2015  f/u ov/Kolbee Bogusz re: uacs Chief Complaint  Patient presents with  . Follow-up    Pt states her breathing has improved some. She is coughing less and has not had as much tightness in her chest. She is using albuterol inhaler 3-4 x per wk on average.    No longer needing albuterol since was on prilosec 40 mg before bfast and occ second dose  With most of her symptoms occuring after supper but not noct  No obvious day to day or daytime variability or assoc excess/ purulent sputum or mucus plugs or hemoptysis or cp or chest tightness, subjective wheeze or overt sinus or hb symptoms. No unusual exp hx or h/o childhood pna/ asthma or knowledge of premature birth.  Sleeping ok without nocturnal  or early am exacerbation  of respiratory   c/o's or need for noct saba. Also denies any obvious fluctuation of symptoms with weather or environmental changes or other aggravating or alleviating factors except as outlined above   Current Medications, Allergies, Complete Past Medical History, Past Surgical History, Family History, and Social History were reviewed in [employer] record.  ROS  The following are not active complaints unless bolded sore throat, dysphagia, dental problems, itching, sneezing,  nasal congestion or excess/ purulent secretions, ear ache,   fever, chills, sweats, unintended wt loss, classically pleuritic or exertional cp,  orthopnea pnd or leg swelling, presyncope, palpitations, abdominal pain, anorexia, nausea, vomiting, diarrhea  or change in bowel or bladder habits, change in stools or urine, dysuria,hematuria,  rash, arthralgias, visual complaints, headache, numbness, weakness or ataxia or problems with walking or coordination,  change in mood/affect or memory.                              Objective:   Physical Exam  amb wf nad    08/11/2015       250   07/06/15 249 lb (112.946 kg)  06/10/15 247 lb (112.038 kg)  06/10/15 247 lb (112.038 kg)    Vital signs  reviewed   HEENT: nl dentition, turbinates, and oropharynx. Nl external ear canals without cough reflex   NECK :  without JVD/Nodes/TM/ nl carotid upstrokes bilaterally   LUNGS: no acc muscle use,  Nl contour chest which is clear to A and P bilaterally without cough on insp or exp maneuvers   CV:  RRR  no s3 or murmur or increase in P2, no edema   ABD:  soft and nontender with nl inspiratory excursion in the supine position. No bruits or organomegaly, bowel sounds nl  MS:  Nl gait/ ext warm without deformities, calf tenderness, cyanosis or clubbing No obvious joint restrictions   SKIN: warm and dry without lesions    NEURO:  alert, approp, nl sensorium with  no motor deficits    I personally reviewed images  and agree with radiology impression as follows:  CXR:  05/27/15 No acute cardiopulmonary disease.            Assessment & Plan:   Outpatient Encounter Prescriptions as of 08/11/2015  Medication Sig  . albuterol (PROVENTIL HFA;VENTOLIN HFA) 108 (90 BASE) MCG/ACT inhaler Inhale 2 puffs into the lungs every 6 (six) hours as needed for wheezing or shortness of breath.  . cetirizine (ZYRTEC) 10 MG tablet Take 10 mg by mouth daily.  Marland Kitchen dicyclomine (BENTYL) 20 MG tablet Take 1 tablet (20 mg total) by mouth 4 (four) times daily -  before meals and at bedtime.  . furosemide (LASIX) 40 MG tablet Take 1 tablet (40 mg total) by mouth every morning.  Marland Kitchen ketoconazole (NIZORAL) 2 % cream Apply 1 application topically 2 (two) times daily as needed for irritation.  Marland Kitchen losartan (COZAAR) 100 MG tablet Take 1 tablet (100 mg total) by mouth daily.  . meloxicam (MOBIC) 7.5 MG tablet Take 7.5 mg by mouth daily.  . mirtazapine (REMERON) 15 MG tablet Take 15 mg by mouth at bedtime.  . mometasone-formoterol (DULERA) 100-5 MCG/ACT AERO Take 2 puffs first thing in am and then another 2 puffs about 12 hours later.  . norgestimate-ethinyl estradiol (ORTHO-CYCLEN,SPRINTEC,PREVIFEM) 0.25-35 MG-MCG tablet Take 1 tablet by mouth every evening.  . nystatin cream (MYCOSTATIN) Apply 1 application topically 2 (two) times daily.  Marland Kitchen omeprazole (PRILOSEC) 20 MG capsule Take x 2 30- 60 min before your first meal and one x 30 m before  last meals of the day  . potassium chloride (KLOR-CON 10) 10 MEQ tablet Take 10 mEq by mouth every other day.  . Prenatal Vit-Fe Sulfate-FA (PRENATAL VITAMIN PO) Take 1 tablet by mouth every evening.   . traZODone (DESYREL) 100 MG tablet Take 100 mg by mouth at bedtime.  Marland Kitchen venlafaxine (EFFEXOR) 50 MG tablet 2 am and 1 pm  . [DISCONTINUED] omeprazole (PRILOSEC) 20 MG capsule TAKE 1 CAPSULE BY MOUTH TWICE DAILY (Patient taking differently: TAKE 1 to 2 CAPSULES BY MOUTH TWICE DAILY)  . [DISCONTINUED]  omeprazole (PRILOSEC) 20 MG capsule Take 40 mg by mouth daily.   No facility-administered encounter medications on file as of 08/11/2015.

## 2015-08-11 NOTE — Patient Instructions (Signed)
Change prilosec to where you take 40 mg before first and 20 mg before last meal  GERD (REFLUX)  is an extremely common cause of respiratory symptoms just like yours , many times with no obvious heartburn at all.    It can be treated with medication, but also with lifestyle changes including elevation of the head of your bed (ideally with 6 inch  bed blocks),  Smoking cessation, avoidance of late meals, excessive alcohol, and avoid fatty foods, chocolate, peppermint, colas, red wine, and acidic juices such as orange juice.  NO MINT OR MENTHOL PRODUCTS SO NO COUGH DROPS  USE SUGARLESS CANDY INSTEAD (Jolley ranchers or Stover's or Life Savers) or even ice chips will also do - the key is to swallow to prevent all throat clearing. NO OIL BASED VITAMINS - use powdered substitutes.   Work on [employer]  If all else fails ok to take dulea 100 up to 2 pffs every 12 hours    If you are satisfied with your treatment plan,  let your doctor know and he/she can either refill your medications or you can return here when your prescription runs out.     If in any way you are not 100% satisfied,  please tell us.  If 100% better, tell your friends!  Pulmonary follow up is as needed

## 2015-08-12 ENCOUNTER — Encounter: Payer: Self-pay | Admitting: Internal Medicine

## 2015-08-12 NOTE — Assessment & Plan Note (Signed)
Spirometry 07/06/2015   Purely restrictive so rec trial off qvar and rx dulera 100 2bid for flare off qvar > no flares on gerd rx as of 08/11/2015   I had an extended final summary discussion with the patient reviewing all relevant studies completed to date and  lasting 15 to 20 minutes of a 25 minute visit on the following issues:    Improvement off inhalers strongly favors  Classic Upper airway cough syndrome, so named because it's frequently impossible to sort out how much is  CR/sinusitis with freq throat clearing (which can be related to primary GERD)   vs  causing  secondary (" extra esophageal")  GERD from wide swings in gastric pressure that occur with throat clearing, often  promoting self use of mint and menthol lozenges that reduce the lower esophageal sphincter tone and exacerbate the problem further in a cyclical fashion.   These are the same pts (now being labeled as having "irritable larynx syndrome" by some cough centers) who not infrequently have a history of having failed to tolerate ace inhibitors,  dry powder inhalers or biphosphonates or report having atypical reflux symptoms that don't respond to standard doses of PPI , and are easily confused as having aecopd or asthma flares by even experienced allergists/ pulmonologists.   For now max rx for gerd/ work on wt/ pulmonary follow-up can be as needed.  Marland Kitchen

## 2015-08-20 MED FILL — OMEPRAZOLE DR 20 MG CAPSULE: 20 | 30 days supply | Qty: 90 | Fill #0

## 2015-08-20 MED FILL — ?MIRTAZAPINE 15 MG TABLET: 15 | 30 days supply | Qty: 30 | Fill #2

## 2015-08-20 MED FILL — MONO-LINYAH 28 TABLET: 0.25-35 | 21 days supply | Qty: 28 | Fill #7

## 2015-09-04 MED FILL — VENLAFAXINE HCL 75 MG TAB: 75 | 30 days supply | Qty: 90 | Fill #2

## 2015-09-17 ENCOUNTER — Other Ambulatory Visit: Payer: Self-pay | Admitting: Internal Medicine

## 2015-09-17 MED FILL — MELOXICAM 15 MG TABLET: 15 | 30 days supply | Qty: 30 | Fill #0

## 2015-09-17 MED FILL — OMEPRAZOLE DR 20 MG CAPSULE: 20 | 30 days supply | Qty: 90 | Fill #1

## 2015-09-17 MED FILL — LOSARTAN POTASSIUM 100 MG T: 100 | 30 days supply | Qty: 30 | Fill #5

## 2015-09-17 MED FILL — MONO-LINYAH 28 TABLET: 0.25-35 | 21 days supply | Qty: 28 | Fill #8

## 2015-09-20 NOTE — Telephone Encounter (Signed)
Stephanie Miles, please call and find out if patient still needs Lasix, is she still having problems with swelling?

## 2015-09-23 ENCOUNTER — Encounter (HOSPITAL_COMMUNITY): Payer: Self-pay | Admitting: Cardiology

## 2015-09-23 ENCOUNTER — Ambulatory Visit (HOSPITAL_COMMUNITY)
Admission: RE | Admit: 2015-09-23 | Discharge: 2015-09-23 | Disposition: A | Discharge status: Home / Self Care | Payer: BLUE CROSS/BLUE SHIELD | Source: Ambulatory Visit | Attending: Cardiology | Admitting: Cardiology

## 2015-09-23 ENCOUNTER — Telehealth: Payer: Self-pay | Admitting: Internal Medicine

## 2015-09-23 ENCOUNTER — Ambulatory Visit (HOSPITAL_BASED_OUTPATIENT_CLINIC_OR_DEPARTMENT_OTHER): Payer: BLUE CROSS/BLUE SHIELD | Admitting: Internal Medicine

## 2015-09-23 ENCOUNTER — Encounter: Payer: Self-pay | Admitting: Internal Medicine

## 2015-09-23 VITALS — BP 138/92 | HR 86 | Temp 97.9°F | Resp 16 | Ht 66.0 in | Wt 250.6 lb

## 2015-09-23 DIAGNOSIS — Q231 Congenital insufficiency of aortic valve: Secondary | ICD-10-CM | POA: Diagnosis not present

## 2015-09-23 DIAGNOSIS — I359 Nonrheumatic aortic valve disorder, unspecified: Secondary | ICD-10-CM | POA: Diagnosis present

## 2015-09-23 DIAGNOSIS — I10 Essential (primary) hypertension: Secondary | ICD-10-CM | POA: Diagnosis not present

## 2015-09-23 DIAGNOSIS — Z3041 Encounter for surveillance of contraceptive pills: Secondary | ICD-10-CM | POA: Diagnosis not present

## 2015-09-23 DIAGNOSIS — I350 Nonrheumatic aortic (valve) stenosis: Secondary | ICD-10-CM | POA: Diagnosis not present

## 2015-09-23 DIAGNOSIS — I352 Nonrheumatic aortic (valve) stenosis with insufficiency: Secondary | ICD-10-CM | POA: Diagnosis not present

## 2015-09-23 DIAGNOSIS — I340 Nonrheumatic mitral (valve) insufficiency: Secondary | ICD-10-CM | POA: Insufficient documentation

## 2015-09-23 DIAGNOSIS — Z6841 Body Mass Index (BMI) 40.0 and over, adult: Secondary | ICD-10-CM | POA: Diagnosis not present

## 2015-09-23 DIAGNOSIS — I119 Hypertensive heart disease without heart failure: Secondary | ICD-10-CM | POA: Diagnosis not present

## 2015-09-23 LAB — BASIC METABOLIC PANEL
BUN: 11 mg/dL (ref 7–25)
CALCIUM: 8.8 mg/dL (ref 8.6–10.2)
CO2: 24 mmol/L (ref 20–31)
CREATININE: 0.76 mg/dL (ref 0.50–1.10)
Chloride: 106 mmol/L (ref 98–110)
Glucose, Bld: 114 mg/dL — ABNORMAL HIGH (ref 65–99)
Potassium: 4.4 mmol/L (ref 3.5–5.3)
SODIUM: 139 mmol/L (ref 135–146)

## 2015-09-23 MED ORDER — FUROSEMIDE 40 MG PO TABS: 40.0000 mg | ORAL_TABLET | Freq: Every morning | ORAL | Status: DC

## 2015-09-23 MED ORDER — LOSARTAN POTASSIUM 100 MG PO TABS: 100.0000 mg | ORAL_TABLET | Freq: Every day | ORAL | Status: DC

## 2015-09-23 MED ORDER — NORGESTIMATE-ETH ESTRADIOL 0.25-35 MG-MCG PO TABS: 1.0000 | ORAL_TABLET | Freq: Every evening | ORAL | Status: DC

## 2015-09-23 MED FILL — FUROSEMIDE 40 MG TABLET: 40 | 30 days supply | Qty: 30 | Fill #0

## 2015-09-23 NOTE — Progress Notes (Signed)
Pt here for F/U for hypertension. Pain today located in joints rated at a 7 described as tight and sore. Pt needs a refill on lasix due to being out over a week and having no refills remaining.

## 2015-09-23 NOTE — Telephone Encounter (Signed)
Patient came in requesting a medication refill for Lasix Please follow up.

## 2015-09-23 NOTE — Progress Notes (Signed)
Echocardiogram 2D Echocardiogram has been performed.  Tresa Res 09/23/2015, 9:46 AM

## 2015-09-23 NOTE — Patient Instructions (Signed)
You may set up a annual appointment with Cardiology clinic here if needed later when due

## 2015-09-23 NOTE — Progress Notes (Signed)
Patient ID: Stephanie Miles, female   DOB: 20-Jan-1981, 35 y.o.   MRN: [id number] Subjective:  Stephanie Miles is a 35 y.o. female with hypertension. Patient reports that she has been out of her Lasix for the past week and is now having joint pains that she feels is related to her swelling. Patient reports that she had a Echocardiogram completed this AM and would like her Cardiologist to give her a call. Patient was previously being seen by Dr. Jenell Milliner here in the speciality clinic. She was told by Dr. Verl Blalock that if her Echo showed no changes then she would not need a repeat in 2 years.  Current Outpatient Prescriptions  Medication Sig Dispense Refill  . cetirizine (ZYRTEC) 10 MG tablet Take 10 mg by mouth daily.    Marland Kitchen dicyclomine (BENTYL) 20 MG tablet Take 1 tablet (20 mg total) by mouth 4 (four) times daily -  before meals and at bedtime. 120 tablet 11  . furosemide (LASIX) 40 MG tablet Take 1 tablet (40 mg total) by mouth every morning. 30 tablet 4  . losartan (COZAAR) 100 MG tablet Take 1 tablet (100 mg total) by mouth daily. 30 tablet 4  . meloxicam (MOBIC) 7.5 MG tablet Take 7.5 mg by mouth daily.    . mirtazapine (REMERON) 15 MG tablet Take 15 mg by mouth at bedtime.    . norgestimate-ethinyl estradiol (ORTHO-CYCLEN,SPRINTEC,PREVIFEM) 0.25-35 MG-MCG tablet Take 1 tablet by mouth every evening. 1 Package 11  . omeprazole (PRILOSEC) 20 MG capsule Take x 2 30- 60 min before your first meal and one x 30 m before  last meals of the day 90 capsule 11  . potassium chloride (KLOR-CON 10) 10 MEQ tablet Take 10 mEq by mouth every other day.    . Prenatal Vit-Fe Sulfate-FA (PRENATAL VITAMIN PO) Take 1 tablet by mouth every evening.     . traZODone (DESYREL) 100 MG tablet Take 100 mg by mouth at bedtime.    Marland Kitchen venlafaxine (EFFEXOR) 50 MG tablet 2 am and 1 pm    . albuterol (PROVENTIL HFA;VENTOLIN HFA) 108 (90 BASE) MCG/ACT inhaler Inhale 2 puffs into the lungs every 6 (six) hours as needed for wheezing  or shortness of breath. (Patient not taking: Reported on 09/23/2015) 3 Inhaler 3  . ketoconazole (NIZORAL) 2 % cream Apply 1 application topically 2 (two) times daily as needed for irritation. (Patient not taking: Reported on 09/23/2015) 30 g 2  . mometasone-formoterol (DULERA) 100-5 MCG/ACT AERO Take 2 puffs first thing in am and then another 2 puffs about 12 hours later. (Patient not taking: Reported on 09/23/2015)    . nystatin cream (MYCOSTATIN) Apply 1 application topically 2 (two) times daily. (Patient not taking: Reported on 09/23/2015) 30 g 1  . [DISCONTINUED] potassium chloride (K-DUR,KLOR-CON) 10 MEQ tablet Take 1 tablet (10 mEq total) by mouth 2 (two) times daily. 30 tablet 3   No current facility-administered medications for this visit.    Hypertension ROS: taking medications as instructed, no medication side effects noted, no TIA's, no chest pain on exertion, no dyspnea on exertion, noting swelling of ankles and no palpitations.    Objective:  BP 138/92 mmHg  Pulse 86  Temp(Src) 97.9 F (36.6 C) (Oral)  Resp 16  Ht 5' 6"  (1.676 m)  Wt 250 lb 9.6 oz (113.671 kg)  BMI 40.47 kg/m2  SpO2 97%  Appearance alert, well appearing, and in no distress, oriented to person, place, and time and overweight. General exam BP noted to  be well controlled today in office, S1, S2 normal, no gallop, no murmur, chest clear, no JVD, no HSM, no edema.  Lab review: orders written for new lab studies as appropriate; see orders.   Assessment:   Stephanie Miles was seen today for hypertension.  Diagnoses and all orders for this visit:  Essential hypertension -     furosemide (LASIX) 40 MG tablet; Take 1 tablet (40 mg total) by mouth every morning. -     losartan (COZAAR) 100 MG tablet; Take 1 tablet (100 mg total) by mouth daily. -     Basic Metabolic Panel Patient blood pressure is stable and may continue on current medication.  Education on diet, exercise, and modifiable risk factors discussed. Will obtain  appropriate labs as needed. Will follow up in 3-6 months.   Encounter for birth control pills maintenance -     Refill norgestimate-ethinyl estradiol (ORTHO-CYCLEN,SPRINTEC,PREVIFEM) 0.25-35 MG-MCG tablet; Take 1 tablet by mouth every evening.   Plan:  Recommended sodium restriction. Copy of written low fat low cholesterol diet provided and reviewed. Cardiovascular risk and specific lipid/LDL goals reviewed..   Return in about 3 months (around 12/24/2015) for Hypertension.   Lance Bosch, NP 09/23/2015 10:50 AM

## 2015-09-24 NOTE — Telephone Encounter (Signed)
Medication was filled at her appointment that was scheduled the same day

## 2015-09-28 ENCOUNTER — Telehealth: Payer: Self-pay

## 2015-09-28 NOTE — Telephone Encounter (Signed)
-----   Message from Lance Bosch, NP sent at 09/28/2015 12:40 PM EST ----- Labs normal except for elevated sugar. The next visit we will check a hemoglobin a1c.

## 2015-09-28 NOTE — Telephone Encounter (Signed)
Pt. Called stating she saw her blood work done and she saw her results on my chart. Pt. Is concerned because her blood sugar is a little high than from last time. Please f/u with pt.

## 2015-09-28 NOTE — Telephone Encounter (Signed)
Pt. Returned call. Please f/u with pt.

## 2015-09-28 NOTE — Telephone Encounter (Signed)
Called patient this pm  Patient not available Message left on voice mail to return our call

## 2015-09-28 NOTE — Telephone Encounter (Signed)
Returned patient phone call Patient is aware that we will check her A1C at her next visit Due to her blood sugars being slightly elevated  Patient agrees

## 2015-10-01 ENCOUNTER — Ambulatory Visit: Payer: BLUE CROSS/BLUE SHIELD | Admitting: Internal Medicine

## 2015-10-02 MED FILL — MONO-LINYAH 28 TABLET: 0.25-35 | 21 days supply | Qty: 28 | Fill #9

## 2015-10-02 MED FILL — DICYCLOMINE 20 MG TABLET: 20 | 26 days supply | Qty: 104 | Fill #6

## 2015-10-02 MED FILL — ?MIRTAZAPINE 15 MG TABLET: 15 | 30 days supply | Qty: 30 | Fill #0

## 2015-10-07 ENCOUNTER — Telehealth: Payer: Self-pay | Admitting: Internal Medicine

## 2015-10-07 NOTE — Telephone Encounter (Signed)
Patient dropped off paperwork to be completed by the doctor. Please follow up

## 2015-10-22 ENCOUNTER — Telehealth: Payer: Self-pay | Admitting: Internal Medicine

## 2015-10-22 NOTE — Telephone Encounter (Signed)
Patient would like echocardiogram results.  Patient would also like to know status of paperwork dropped off on 3/15   Please follow up.

## 2015-10-29 ENCOUNTER — Other Ambulatory Visit: Payer: Self-pay | Admitting: Internal Medicine

## 2015-10-29 MED FILL — LOSARTAN POTASSIUM 100 MG T: 100 | 30 days supply | Qty: 30 | Fill #0

## 2015-10-29 MED FILL — ?FUROSEMIDE 40 MG TABLET: 40 | 30 days supply | Qty: 30 | Fill #1

## 2015-10-29 MED FILL — MONO-LINYAH 28 TABLET: 0.25-35 | 21 days supply | Qty: 28 | Fill #10

## 2015-10-29 MED FILL — ?MIRTAZAPINE 15 MG TABLET: 15 | 30 days supply | Qty: 30 | Fill #1

## 2015-10-29 MED FILL — MELOXICAM 15 MG TABLET: 15 | 30 days supply | Qty: 30 | Fill #1

## 2015-10-29 MED FILL — OMEPRAZOLE DR 20 MG CAPSULE: 20 | 30 days supply | Qty: 90 | Fill #2

## 2015-10-29 MED FILL — VENLAFAXINE HCL 75 MG TAB: 75 | 30 days supply | Qty: 90 | Fill #0

## 2015-10-29 MED FILL — traZODone HCL 50 MG TABS: 50 | 30 days supply | Qty: 60 | Fill #1

## 2015-10-29 NOTE — Telephone Encounter (Signed)
CMA called patient, patient verified name and DOB. Patient was told that her forms for Medical records were available for pick up.

## 2015-11-23 MED FILL — MONO-LINYAH 28 TABLET: 0.25-35 | 28 days supply | Qty: 28 | Fill #11

## 2015-12-05 ENCOUNTER — Emergency Department (HOSPITAL_COMMUNITY): Payer: BLUE CROSS/BLUE SHIELD

## 2015-12-05 ENCOUNTER — Emergency Department (HOSPITAL_COMMUNITY)
Admission: EM | Admit: 2015-12-05 | Discharge: 2015-12-05 | Disposition: A | Discharge status: Home / Self Care | Payer: BLUE CROSS/BLUE SHIELD | Attending: Emergency Medicine | Admitting: Emergency Medicine

## 2015-12-05 ENCOUNTER — Encounter (HOSPITAL_COMMUNITY): Payer: Self-pay

## 2015-12-05 DIAGNOSIS — I129 Hypertensive chronic kidney disease with stage 1 through stage 4 chronic kidney disease, or unspecified chronic kidney disease: Secondary | ICD-10-CM | POA: Insufficient documentation

## 2015-12-05 DIAGNOSIS — Z793 Long term (current) use of hormonal contraceptives: Secondary | ICD-10-CM | POA: Insufficient documentation

## 2015-12-05 DIAGNOSIS — F419 Anxiety disorder, unspecified: Secondary | ICD-10-CM | POA: Insufficient documentation

## 2015-12-05 DIAGNOSIS — N189 Chronic kidney disease, unspecified: Secondary | ICD-10-CM | POA: Diagnosis not present

## 2015-12-05 DIAGNOSIS — R011 Cardiac murmur, unspecified: Secondary | ICD-10-CM | POA: Diagnosis not present

## 2015-12-05 DIAGNOSIS — Z791 Long term (current) use of non-steroidal anti-inflammatories (NSAID): Secondary | ICD-10-CM | POA: Insufficient documentation

## 2015-12-05 DIAGNOSIS — G43909 Migraine, unspecified, not intractable, without status migrainosus: Secondary | ICD-10-CM | POA: Diagnosis not present

## 2015-12-05 DIAGNOSIS — Z79899 Other long term (current) drug therapy: Secondary | ICD-10-CM | POA: Insufficient documentation

## 2015-12-05 DIAGNOSIS — R079 Chest pain, unspecified: Secondary | ICD-10-CM | POA: Insufficient documentation

## 2015-12-05 DIAGNOSIS — F329 Major depressive disorder, single episode, unspecified: Secondary | ICD-10-CM | POA: Insufficient documentation

## 2015-12-05 DIAGNOSIS — J45901 Unspecified asthma with (acute) exacerbation: Secondary | ICD-10-CM | POA: Insufficient documentation

## 2015-12-05 DIAGNOSIS — K219 Gastro-esophageal reflux disease without esophagitis: Secondary | ICD-10-CM | POA: Insufficient documentation

## 2015-12-05 DIAGNOSIS — Z87891 Personal history of nicotine dependence: Secondary | ICD-10-CM | POA: Diagnosis not present

## 2015-12-05 LAB — CBC
HEMATOCRIT: 39.6 % (ref 36.0–46.0)
HEMOGLOBIN: 13 g/dL (ref 12.0–15.0)
MCH: 28.4 pg (ref 26.0–34.0)
MCHC: 32.8 g/dL (ref 30.0–36.0)
MCV: 86.5 fL (ref 78.0–100.0)
Platelets: 315 10*3/uL (ref 150–400)
RBC: 4.58 MIL/uL (ref 3.87–5.11)
RDW: 13.3 % (ref 11.5–15.5)
WBC: 13.1 10*3/uL — ABNORMAL HIGH (ref 4.0–10.5)

## 2015-12-05 LAB — BASIC METABOLIC PANEL
ANION GAP: 12 (ref 5–15)
BUN: 9 mg/dL (ref 6–20)
CO2: 21 mmol/L — AB (ref 22–32)
Calcium: 8.9 mg/dL (ref 8.9–10.3)
Chloride: 100 mmol/L — ABNORMAL LOW (ref 101–111)
Creatinine, Ser: 0.8 mg/dL (ref 0.44–1.00)
GFR calc Af Amer: >60 mL/min (ref >60)
GFR calc non Af Amer: >60 mL/min (ref >60)
GLUCOSE: 129 mg/dL — AB (ref 65–99)
POTASSIUM: 3.4 mmol/L — AB (ref 3.5–5.1)
Sodium: 133 mmol/L — ABNORMAL LOW (ref 135–145)

## 2015-12-05 LAB — I-STAT TROPONIN, ED: Troponin i, poc: 0 ng/mL (ref 0.00–0.08)

## 2015-12-05 LAB — D-DIMER, QUANTITATIVE: D-Dimer, Quant: <0.27 ug/mL-FEU (ref 0.00–0.50)

## 2015-12-05 NOTE — ED Notes (Signed)
Dr. Floyd at bedside. 

## 2015-12-05 NOTE — Discharge Instructions (Signed)
Continue to take your Mobic.  Take tylenol 1-2 tabs every 6 hours.  Chest Wall Pain Chest wall pain is pain in or around the bones and muscles of your chest. Sometimes, an injury causes this pain. Sometimes, the cause may not be known. This pain may take several weeks or longer to get better. HOME CARE Pay attention to any changes in your symptoms. Take these actions to help with your pain:  Rest as told by your doctor.  Avoid activities that cause pain. Try not to use your chest, belly (abdominal), or side muscles to lift heavy things.  If directed, apply ice to the painful area:  Put ice in a plastic bag.  Place a towel between your skin and the bag.  Leave the ice on for 20 minutes, 2-3 times per day.  Take over-the-counter and prescription medicines only as told by your doctor.  Do not use tobacco products, including cigarettes, chewing tobacco, and e-cigarettes. If you need help quitting, ask your doctor.  Keep all follow-up visits as told by your doctor. This is important. GET HELP IF:  You have a fever.  Your chest pain gets worse.  You have new symptoms. GET HELP RIGHT AWAY IF:  You feel sick to your stomach (nauseous) or you throw up (vomit).  You feel sweaty or light-headed.  You have a cough with phlegm (sputum) or you cough up blood.  You are short of breath.   This information is not intended to replace advice given to you by your health care provider. Make sure you discuss any questions you have with your health care provider.   Document Released: 12/28/2007 Document Revised: 04/01/2015 Document Reviewed: 10/06/2014 Elsevier Interactive Patient Education [employer].

## 2015-12-05 NOTE — ED Notes (Signed)
Per [facility]: Pt c/o chest pain shortness of breath. Pt given 324 ASA, and 1 nitro, pain was a 6/10 and went down to 4/10 and has now increased back to 6/10, pain is in left side of chest, neck, and jaw. Pt had an echocardiogram in March, never found out results. Pt has atrial regurgitation. PT on lasix and losartan for BP.

## 2015-12-05 NOTE — ED Provider Notes (Signed)
CSN: [id number]     Arrival date & time 12/05/15  2112 History   First MD Initiated Contact with Patient 12/05/15 2137     Chief Complaint  Patient presents with  . Chest Pain     (Consider location/radiation/quality/duration/timing/severity/associated sxs/prior Treatment) Patient is a 35 y.o. female presenting with chest pain. The history is provided by the patient.  Chest Pain Pain location:  Substernal area Pain radiates to:  Does not radiate Pain radiates to the back: no   Pain severity:  No pain Onset quality:  Sudden Duration:  2 hours Timing:  Constant Progression:  Unchanged Chronicity:  New Relieved by:  Nothing Worsened by:  Nothing tried Ineffective treatments:  None tried Associated symptoms: shortness of breath   Associated symptoms: no dizziness, no fever, no headache, no nausea, no palpitations and not vomiting    35 yo F With a chief complaint of left-sided chest pain. This initially started in her jaw and radiated down into her neck and her chest. Patient also having some arm and leg symptoms. Unsure if this better or worse. Has some mild shortness of breath with this. Denies any lower extremity edema. Family history of MIs in their 41s.  Past Medical History  Diagnosis Date  . Hypertension   . GERD (gastroesophageal reflux disease)   . IBS (irritable bowel syndrome)   . Migraine headache   . S/P appy 05/2013  . Anxiety   . Allergy   . Depression   . Heart murmur   . Chronic kidney disease     kidney stones  . Asthma   . Aortic regurgitation due to bicuspid aortic valve    Past Surgical History  Procedure Laterality Date  . Dental surgery    . Laparoscopic appendectomy N/A 06/06/2013    Procedure: APPENDECTOMY LAPAROSCOPIC;  Surgeon: Imogene Burn. Georgette Dover, MD;  Location: [place];  Service: General;  Laterality: N/A;  . Appendectomy     Family History  Problem Relation Age of Onset  . Asthma Mother   . Hypertension Mother   . Breast cancer Father   .  Diabetes Father   . Hypertension Father   . Heart disease Father   . Hyperlipidemia Father   . Asthma Sister   . Hyperlipidemia Brother   . Hypertension Brother   . Vision loss Brother   . Colon cancer Maternal Grandmother   . Esophageal cancer Neg Hx   . Pancreatic cancer Neg Hx   . Rectal cancer Neg Hx   . Stomach cancer Neg Hx   . Asthma Sister   . Cervical cancer Maternal Aunt   . Liver cancer Maternal Grandmother   . Melanoma Maternal Uncle    Social History  Substance Use Topics  . Smoking status: Former Smoker -- 1.00 packs/day for 20 years    Quit date: 02/22/2013  . Smokeless tobacco: Never Used  . Alcohol Use: Yes     Comment: social   OB History    No data available     Review of Systems  Constitutional: Negative for fever and chills.  HENT: Negative for congestion and rhinorrhea.   Eyes: Negative for redness and visual disturbance.  Respiratory: Positive for shortness of breath. Negative for wheezing.   Cardiovascular: Positive for chest pain. Negative for palpitations.  Gastrointestinal: Negative for nausea and vomiting.  Genitourinary: Negative for dysuria and urgency.  Musculoskeletal: Negative for myalgias and arthralgias.  Skin: Negative for pallor and wound.  Neurological: Negative for dizziness and headaches.  Allergies  Codeine  Home Medications   Prior to Admission medications   Medication Sig Start Date End Date Taking? Authorizing Provider  cetirizine (ZYRTEC) 10 MG tablet Take 10 mg by mouth daily.   Yes Historical Provider, MD  dicyclomine (BENTYL) 20 MG tablet Take 1 tablet (20 mg total) by mouth 4 (four) times daily -  before meals and at bedtime. Patient taking differently: Take 20 mg by mouth 3 (three) times daily before meals.  10/10/14  Yes Ladene Artist, MD  furosemide (LASIX) 40 MG tablet Take 1 tablet (40 mg total) by mouth every morning. 09/23/15  Yes Lance Bosch, NP  losartan (COZAAR) 100 MG tablet Take 1 tablet (100 mg  total) by mouth daily. 09/23/15  Yes Lance Bosch, NP  meloxicam (MOBIC) 15 MG tablet Take 15 mg by mouth at bedtime.   Yes Historical Provider, MD  mirtazapine (REMERON) 15 MG tablet Take 15 mg by mouth at bedtime.   Yes Historical Provider, MD  Multiple Vitamin (MULTIVITAMIN WITH MINERALS) TABS tablet Take 1 tablet by mouth daily.   Yes Historical Provider, MD  norgestimate-ethinyl estradiol (ORTHO-CYCLEN,SPRINTEC,PREVIFEM) 0.25-35 MG-MCG tablet Take 1 tablet by mouth every evening. 09/23/15  Yes Lance Bosch, NP  nystatin cream (MYCOSTATIN) Apply 1 application topically 2 (two) times daily. Patient taking differently: Apply 1 application topically 2 (two) times daily as needed for dry skin.  04/03/14  Yes Lance Bosch, NP  omeprazole (PRILOSEC) 20 MG capsule Take 20-40 mg by mouth 2 (two) times daily before a meal. Takes 2 caps in am and 1 cap in pm as needed   Yes Historical Provider, MD  potassium chloride (K-DUR,KLOR-CON) 10 MEQ tablet Take 10 mEq by mouth every other day.   Yes Historical Provider, MD  Prenatal Vit-Fe Sulfate-FA (PRENATAL VITAMIN PO) Take 1 tablet by mouth every evening.    Yes Historical Provider, MD  traZODone (DESYREL) 100 MG tablet Take 50-100 mg by mouth at bedtime.    Yes Historical Provider, MD  venlafaxine (EFFEXOR) 75 MG tablet Take 75-150 mg by mouth 2 (two) times daily. Takes 2 tabs in am and 1 tab in pm   Yes Historical Provider, MD  albuterol (PROVENTIL HFA;VENTOLIN HFA) 108 (90 BASE) MCG/ACT inhaler Inhale 2 puffs into the lungs every 6 (six) hours as needed for wheezing or shortness of breath. Patient not taking: Reported on 09/23/2015 10/22/14   Tresa Garter, MD  ketoconazole (NIZORAL) 2 % cream Apply 1 application topically 2 (two) times daily as needed for irritation. Patient not taking: Reported on 09/23/2015 09/12/14   Lance Bosch, NP  mometasone-formoterol [facility]) 100-5 MCG/ACT AERO Take 2 puffs first thing in am and then another 2 puffs about 12  hours later. Patient not taking: Reported on 09/23/2015 07/06/15   Tanda Rockers, MD  omeprazole (PRILOSEC) 20 MG capsule Take x 2 30- 60 min before your first meal and one x 30 m before  last meals of the day 08/11/15   Tanda Rockers, MD   BP 128/89 mmHg  Pulse 86  Temp(Src) 98.4 F (36.9 C) (Oral)  Resp 15  Ht 5' 6"  (9.476 m)  Wt 250 lb (113.399 kg)  BMI 40.37 kg/m2  SpO2 95%  LMP 11/23/2015 Physical Exam  Constitutional: She is oriented to person, place, and time. She appears well-developed and well-nourished. No distress.  HENT:  Head: Normocephalic and atraumatic.  Eyes: EOM are normal. Pupils are equal, round, and reactive to light.  Neck: Normal range of motion. Neck supple.  Cardiovascular: Normal rate and regular rhythm.  Exam reveals no gallop and no friction rub.   No murmur heard. Pulmonary/Chest: Effort normal. She has no wheezes. She has no rales.  Abdominal: Soft. She exhibits no distension. There is no tenderness.  Musculoskeletal: She exhibits no edema or tenderness.  Neurological: She is alert and oriented to person, place, and time.  Skin: Skin is warm and dry. She is not diaphoretic.  Psychiatric: She has a normal mood and affect. Her behavior is normal.  Nursing note and vitals reviewed.   ED Course  Procedures (including critical care time) Labs Review Labs Reviewed  BASIC METABOLIC PANEL - Abnormal; Notable for the following:    Sodium 133 (*)    Potassium 3.4 (*)    Chloride 100 (*)    CO2 21 (*)    Glucose, Bld 129 (*)    All other components within normal limits  CBC - Abnormal; Notable for the following:    WBC 13.1 (*)    All other components within normal limits  D-DIMER, QUANTITATIVE (NOT AT [facility])  Randolm Idol, ED    Imaging Review Dg Chest 2 View  12/05/2015  CLINICAL DATA:  HIGH BP TODAY, CHEST PAIN AND SOB TODAY, HEADACHE. HTN, EX-SMOKER. EXAM: CHEST  2 VIEW COMPARISON:  05/27/2015 FINDINGS: The heart size and mediastinal  contours are within normal limits. Both lungs are clear. The visualized skeletal structures are unremarkable. IMPRESSION: No active cardiopulmonary disease. Electronically Signed   By: Nolon Nations M.D.   On: 12/05/2015 21:46   I have personally reviewed and evaluated these images and lab results as part of my medical decision-making.   EKG Interpretation   Date/Time:  Saturday Dec 05 2015 21:19:34 EDT Ventricular Rate:  95 PR Interval:  177 QRS Duration: 101 QT Interval:  350 QTC Calculation: 440 R Axis:   15 Text Interpretation:  Sinus rhythm Abnormal R-wave progression, early  transition Baseline wander in lead(s) II III aVF No significant change  since last tracing Confirmed by Christain Niznik MD, Quillian Quince ([id number]) on 12/05/2015  9:29:05 PM      MDM   Final diagnoses:  Chest pain, unspecified chest pain type    35 yo F with a cc of chest pain CXR and ecg unremarkable.  No s/sx of heart failure with history of moderate aortic regurg. D-dimer negative. Feel that a ACS and PE are unlikely.  11:58 PM:  I have discussed the diagnosis/risks/treatment options with the patient and family and believe the pt to be eligible for discharge home to follow-up with PCP. We also discussed returning to the ED immediately if new or worsening sx occur. We discussed the sx which are most concerning (e.g., sudden worsening pain, fever, inability to tolerate by mouth) that necessitate immediate return. Medications administered to the patient during their visit and any new prescriptions provided to the patient are listed below.  Medications given during this visit Medications - No data to display  Discharge Medication List as of 12/05/2015 10:43 PM      The patient appears reasonably screen and/or stabilized for discharge and I doubt any other medical condition or other [facility] requiring further screening, evaluation, or treatment in the ED at this time prior to discharge.       Deno Etienne, DO 12/05/15  2358

## 2015-12-11 MED FILL — OMEPRAZOLE DR 20 MG CAPSULE: 20 | 30 days supply | Qty: 90 | Fill #3

## 2015-12-11 MED FILL — MELOXICAM 15 MG TABLET: 15 | 30 days supply | Qty: 30 | Fill #2

## 2015-12-31 ENCOUNTER — Other Ambulatory Visit: Payer: Self-pay | Admitting: Gastroenterology

## 2015-12-31 MED FILL — ?MIRTAZAPINE 15 MG TABLET: 15 | 30 days supply | Qty: 30 | Fill #2

## 2016-01-05 MED FILL — LOSARTAN POTASSIUM 100 MG T: 100 | 30 days supply | Qty: 30 | Fill #1

## 2016-01-05 MED FILL — FUROSEMIDE 40 MG TABLET: 40 | 30 days supply | Qty: 30 | Fill #2

## 2016-01-06 ENCOUNTER — Other Ambulatory Visit: Payer: Self-pay | Admitting: Gastroenterology

## 2016-01-06 MED FILL — MONO-LINYAH 28 TABLET: 0.25-35 | 28 days supply | Qty: 28 | Fill #0

## 2016-01-06 NOTE — Telephone Encounter (Signed)
Pt called needs refill as soon as possible.   VC

## 2016-01-11 ENCOUNTER — Telehealth: Payer: Self-pay | Admitting: Internal Medicine

## 2016-01-11 NOTE — Telephone Encounter (Signed)
Pt. Called requesting for a letter for her job. Pt. States that it is really hot where she works at and needs a fan.  Pt. Job will not let her have a fan until she gets a letter from her PCP.  Please f/u with pt.

## 2016-01-15 ENCOUNTER — Other Ambulatory Visit: Payer: Self-pay | Admitting: Gastroenterology

## 2016-01-15 MED FILL — ?OMEPRAZOLE DR 20 MG CAPSUL: 20 | 30 days supply | Qty: 90 | Fill #4

## 2016-01-15 MED FILL — MELOXICAM 15 MG TABLET: 15 | 30 days supply | Qty: 30 | Fill #3

## 2016-01-15 MED FILL — DICYCLOMINE 20 MG TABLET: 20 | 30 days supply | Qty: 120 | Fill #0

## 2016-01-15 MED FILL — traZODone HCL 50 MG TABS: 50 | 30 days supply | Qty: 60 | Fill #2

## 2016-01-15 MED FILL — VENLAFAXINE HCL 75 MG TAB: 75 | 30 days supply | Qty: 90 | Fill #1

## 2016-01-15 NOTE — Telephone Encounter (Signed)
Patient at pharmacy and stated she needs a refill. Informed her that she needs to make an appt. Patient scheduled appt for 02/03/16.

## 2016-01-27 ENCOUNTER — Ambulatory Visit: Payer: BLUE CROSS/BLUE SHIELD | Admitting: Family Medicine

## 2016-01-27 MED FILL — MONO-LINYAH 28 TABLET: 0.25-35 | 28 days supply | Qty: 28 | Fill #1

## 2016-01-28 ENCOUNTER — Encounter (HOSPITAL_COMMUNITY): Payer: Self-pay | Admitting: Emergency Medicine

## 2016-01-28 ENCOUNTER — Emergency Department (HOSPITAL_COMMUNITY)
Admission: EM | Admit: 2016-01-28 | Discharge: 2016-01-28 | Disposition: A | Discharge status: Home / Self Care | Payer: BLUE CROSS/BLUE SHIELD | Attending: Emergency Medicine | Admitting: Emergency Medicine

## 2016-01-28 ENCOUNTER — Emergency Department (HOSPITAL_COMMUNITY): Payer: BLUE CROSS/BLUE SHIELD

## 2016-01-28 DIAGNOSIS — R1013 Epigastric pain: Secondary | ICD-10-CM | POA: Diagnosis not present

## 2016-01-28 DIAGNOSIS — J45909 Unspecified asthma, uncomplicated: Secondary | ICD-10-CM | POA: Insufficient documentation

## 2016-01-28 DIAGNOSIS — N189 Chronic kidney disease, unspecified: Secondary | ICD-10-CM | POA: Diagnosis not present

## 2016-01-28 DIAGNOSIS — R1011 Right upper quadrant pain: Secondary | ICD-10-CM | POA: Diagnosis present

## 2016-01-28 DIAGNOSIS — Z87891 Personal history of nicotine dependence: Secondary | ICD-10-CM | POA: Diagnosis not present

## 2016-01-28 DIAGNOSIS — R109 Unspecified abdominal pain: Secondary | ICD-10-CM

## 2016-01-28 DIAGNOSIS — Z79899 Other long term (current) drug therapy: Secondary | ICD-10-CM | POA: Insufficient documentation

## 2016-01-28 DIAGNOSIS — I129 Hypertensive chronic kidney disease with stage 1 through stage 4 chronic kidney disease, or unspecified chronic kidney disease: Secondary | ICD-10-CM | POA: Insufficient documentation

## 2016-01-28 LAB — COMPREHENSIVE METABOLIC PANEL
ALT: 89 U/L — AB (ref 14–54)
AST: 71 U/L — ABNORMAL HIGH (ref 15–41)
Albumin: 3.2 g/dL — ABNORMAL LOW (ref 3.5–5.0)
Alkaline Phosphatase: 90 U/L (ref 38–126)
Anion gap: 7 (ref 5–15)
BUN: 10 mg/dL (ref 6–20)
CALCIUM: 9.2 mg/dL (ref 8.9–10.3)
CHLORIDE: 101 mmol/L (ref 101–111)
CO2: 28 mmol/L (ref 22–32)
Creatinine, Ser: 0.95 mg/dL (ref 0.44–1.00)
GFR calc non Af Amer: >60 mL/min (ref >60)
Glucose, Bld: 87 mg/dL (ref 65–99)
POTASSIUM: 4 mmol/L (ref 3.5–5.1)
SODIUM: 136 mmol/L (ref 135–145)
Total Bilirubin: <0.1 mg/dL — ABNORMAL LOW (ref 0.3–1.2)
Total Protein: 6.9 g/dL (ref 6.5–8.1)

## 2016-01-28 LAB — CBC WITH DIFFERENTIAL/PLATELET
BASOS ABS: 0.1 10*3/uL (ref 0.0–0.1)
Basophils Relative: 1 %
EOS ABS: 0.4 10*3/uL (ref 0.0–0.7)
EOS PCT: 3 %
HCT: 39.6 % (ref 36.0–46.0)
Hemoglobin: 12.9 g/dL (ref 12.0–15.0)
LYMPHS ABS: 5 10*3/uL — AB (ref 0.7–4.0)
Lymphocytes Relative: 37 %
MCH: 28.5 pg (ref 26.0–34.0)
MCHC: 32.6 g/dL (ref 30.0–36.0)
MCV: 87.4 fL (ref 78.0–100.0)
MONO ABS: 0.9 10*3/uL (ref 0.1–1.0)
Monocytes Relative: 7 %
Neutro Abs: 7 10*3/uL (ref 1.7–7.7)
Neutrophils Relative %: 52 %
PLATELETS: 314 10*3/uL (ref 150–400)
RBC: 4.53 MIL/uL (ref 3.87–5.11)
RDW: 13.7 % (ref 11.5–15.5)
WBC: 13.4 10*3/uL — AB (ref 4.0–10.5)

## 2016-01-28 LAB — LIPASE, BLOOD: Lipase: 28 U/L (ref 11–51)

## 2016-01-28 LAB — URINE MICROSCOPIC-ADD ON

## 2016-01-28 LAB — URINALYSIS, ROUTINE W REFLEX MICROSCOPIC
BILIRUBIN URINE: NEGATIVE
Glucose, UA: NEGATIVE mg/dL
HGB URINE DIPSTICK: NEGATIVE
Ketones, ur: NEGATIVE mg/dL
Nitrite: NEGATIVE
Protein, ur: NEGATIVE mg/dL
SPECIFIC GRAVITY, URINE: 1.026 (ref 1.005–1.030)
pH: 6 (ref 5.0–8.0)

## 2016-01-28 LAB — PREGNANCY, URINE: PREG TEST UR: NEGATIVE

## 2016-01-28 LAB — TROPONIN I: Troponin I: <0.03 ng/mL (ref <0.03)

## 2016-01-28 LAB — D-DIMER, QUANTITATIVE: D-Dimer, Quant: <0.27 ug/mL-FEU (ref 0.00–0.50)

## 2016-01-28 NOTE — ED Provider Notes (Signed)
CSN: [id number]     Arrival date & time 01/28/16  1736 History   First MD Initiated Contact with Patient 01/28/16 1750     Chief Complaint  Patient presents with  . Abdominal Pain     (Consider location/radiation/quality/duration/timing/severity/associated sxs/prior Treatment) Patient is a 35 y.o. female presenting with abdominal pain. The history is provided by the patient. No language interpreter was used.  Abdominal Pain Pain location:  RUQ Pain quality: aching   Pain radiates to:  Does not radiate Pain severity:  Severe Onset quality:  Gradual Duration:  2 days Timing:  Constant Progression:  Worsening Chronicity:  New Context: previous surgery   Context: not alcohol use   Relieved by:  Nothing Worsened by:  Nothing tried Ineffective treatments:  None tried Associated symptoms: chest pain   Risk factors: NSAID use   Risk factors: no alcohol abuse   [facility]  Pt has elevation of lft's.  Pt reports today she has had pain in the right side of chest.  Pt reports she now has pain full abdomen, worse in the upper abdomen.    Past Medical History  Diagnosis Date  . Hypertension   . GERD (gastroesophageal reflux disease)   . IBS (irritable bowel syndrome)   . Migraine headache   . S/P appy 05/2013  . Anxiety   . Allergy   . Depression   . Heart murmur   . Chronic kidney disease     kidney stones  . Asthma   . Aortic regurgitation due to bicuspid aortic valve    Past Surgical History  Procedure Laterality Date  . Dental surgery    . Laparoscopic appendectomy N/A 06/06/2013    Procedure: APPENDECTOMY LAPAROSCOPIC;  Surgeon: Imogene Burn. Georgette Dover, MD;  Location: [place];  Service: General;  Laterality: N/A;  . Appendectomy     Family History  Problem Relation Age of Onset  . Asthma Mother   . Hypertension Mother   . Breast cancer Father   . Diabetes Father   . Hypertension Father   . Heart disease Father   . Hyperlipidemia Father   .  Asthma Sister   . Hyperlipidemia Brother   . Hypertension Brother   . Vision loss Brother   . Colon cancer Maternal Grandmother   . Esophageal cancer Neg Hx   . Pancreatic cancer Neg Hx   . Rectal cancer Neg Hx   . Stomach cancer Neg Hx   . Asthma Sister   . Cervical cancer Maternal Aunt   . Liver cancer Maternal Grandmother   . Melanoma Maternal Uncle    Social History  Substance Use Topics  . Smoking status: Former Smoker -- 1.00 packs/day for 20 years    Quit date: 02/22/2013  . Smokeless tobacco: Never Used  . Alcohol Use: Yes     Comment: social   OB History    No data available     Review of Systems  Cardiovascular: Positive for chest pain.  Gastrointestinal: Positive for abdominal pain.  All other systems reviewed and are negative.     Allergies  Codeine  Home Medications   Prior to Admission medications   Medication Sig Start Date End Date Taking? Authorizing Provider  albuterol (PROVENTIL HFA;VENTOLIN HFA) 108 (90 BASE) MCG/ACT inhaler Inhale 2 puffs into the lungs every 6 (six) hours as needed for wheezing or shortness of breath. Patient not taking: Reported on 09/23/2015 10/22/14   Tresa Garter, MD  cetirizine (ZYRTEC)  10 MG tablet Take 10 mg by mouth daily.    Historical Provider, MD  dicyclomine (BENTYL) 20 MG tablet TAKE 1 TABLET BY MOUTH 4 TIMES DAILY BEFORE MEALS AND AT BEDTIME 01/15/16   Ladene Artist, MD  furosemide (LASIX) 40 MG tablet Take 1 tablet (40 mg total) by mouth every morning. 09/23/15   Lance Bosch, NP  ketoconazole (NIZORAL) 2 % cream Apply 1 application topically 2 (two) times daily as needed for irritation. Patient not taking: Reported on 09/23/2015 09/12/14   Lance Bosch, NP  losartan (COZAAR) 100 MG tablet Take 1 tablet (100 mg total) by mouth daily. 09/23/15   Lance Bosch, NP  meloxicam (MOBIC) 15 MG tablet Take 15 mg by mouth at bedtime.    Historical Provider, MD  mirtazapine (REMERON) 15 MG tablet Take 15 mg by mouth at  bedtime.    Historical Provider, MD  mometasone-formoterol (DULERA) 100-5 MCG/ACT AERO Take 2 puffs first thing in am and then another 2 puffs about 12 hours later. Patient not taking: Reported on 09/23/2015 07/06/15   Tanda Rockers, MD  Multiple Vitamin (MULTIVITAMIN WITH MINERALS) TABS tablet Take 1 tablet by mouth daily.    Historical Provider, MD  norgestimate-ethinyl estradiol (ORTHO-CYCLEN,SPRINTEC,PREVIFEM) 0.25-35 MG-MCG tablet Take 1 tablet by mouth every evening. 09/23/15   Lance Bosch, NP  nystatin cream (MYCOSTATIN) Apply 1 application topically 2 (two) times daily. Patient taking differently: Apply 1 application topically 2 (two) times daily as needed for dry skin.  04/03/14   Lance Bosch, NP  omeprazole (PRILOSEC) 20 MG capsule Take x 2 30- 60 min before your first meal and one x 30 m before  last meals of the day 08/11/15   Tanda Rockers, MD  omeprazole (PRILOSEC) 20 MG capsule Take 20-40 mg by mouth 2 (two) times daily before a meal. Takes 2 caps in am and 1 cap in pm as needed    Historical Provider, MD  potassium chloride (K-DUR,KLOR-CON) 10 MEQ tablet Take 10 mEq by mouth every other day.    Historical Provider, MD  Prenatal Vit-Fe Sulfate-FA (PRENATAL VITAMIN PO) Take 1 tablet by mouth every evening.     Historical Provider, MD  traZODone (DESYREL) 100 MG tablet Take 50-100 mg by mouth at bedtime.     Historical Provider, MD  venlafaxine (EFFEXOR) 75 MG tablet Take 75-150 mg by mouth 2 (two) times daily. Takes 2 tabs in am and 1 tab in pm    Historical Provider, MD   BP 146/88 mmHg  Pulse 75  Temp(Src) 98.3 F (36.8 C) (Oral)  Resp 13  Ht 5' 6"  (1.676 m)  Wt 113.399 kg  BMI 40.37 kg/m2  SpO2 99%  LMP 01/01/2016 Physical Exam  Constitutional: She is oriented to person, place, and time. She appears well-developed and well-nourished.  HENT:  Head: Normocephalic.  Right Ear: External ear normal.  Left Ear: External ear normal.  Nose: Nose normal.  Mouth/Throat:  Oropharynx is clear and moist.  Eyes: Conjunctivae and EOM are normal. Pupils are equal, round, and reactive to light.  Neck: Normal range of motion.  Cardiovascular: Normal rate and normal heart sounds.   Pulmonary/Chest: Effort normal.  Abdominal: She exhibits no distension. There is tenderness.  Musculoskeletal: Normal range of motion.  Neurological: She is alert and oriented to person, place, and time.  Skin: Skin is warm.  Psychiatric: She has a normal mood and affect.  Nursing note and vitals reviewed.   ED  Course  Procedures (including critical care time) Labs Review Labs Reviewed  CBC WITH DIFFERENTIAL/PLATELET - Abnormal; Notable for the following:    WBC 13.4 (*)    Lymphs Abs 5.0 (*)    All other components within normal limits  COMPREHENSIVE METABOLIC PANEL - Abnormal; Notable for the following:    Albumin 3.2 (*)    AST 71 (*)    ALT 89 (*)    Total Bilirubin <0.1 (*)    All other components within normal limits  URINALYSIS, ROUTINE W REFLEX MICROSCOPIC (NOT AT [facility]) - Abnormal; Notable for the following:    APPearance CLOUDY (*)    Leukocytes, UA SMALL (*)    All other components within normal limits  URINE MICROSCOPIC-ADD ON - Abnormal; Notable for the following:    Squamous Epithelial / LPF 0-5 (*)    Bacteria, UA MANY (*)    All other components within normal limits  LIPASE, BLOOD  PREGNANCY, URINE  TROPONIN I    Imaging Review Dg Chest 2 View  01/28/2016  CLINICAL DATA:  Chest pain for 1 day. EXAM: CHEST  2 VIEW COMPARISON:  12/05/2015 FINDINGS: The cardiomediastinal contours are normal. The lungs are clear. Pulmonary vasculature is normal. No consolidation, pleural effusion, or pneumothorax. No acute osseous abnormalities are seen. IMPRESSION: No acute pulmonary process. Electronically Signed   By: Jeb Levering M.D.   On: 01/28/2016 19:32   US Abdomen Complete  01/28/2016  CLINICAL DATA:  Abdominal pain EXAM: ABDOMEN ULTRASOUND COMPLETE COMPARISON:   CT abdomen and pelvis 08/21/2013 FINDINGS: Gallbladder: Contracted, patient ate lunch today. No shadowing calculi. Unable to accurately assess wall thickness in the contracted state. No gross pericholecystic fluid or sonographic Murphy sign. Common bile duct: Diameter: 4 mm diameter, Liver: Echogenic, likely fatty infiltration, though this can be seen with cirrhosis and certain infiltrative disorders. Subjectively appears enlarged. No focal lesions or nodularity identified though assessment of intrahepatic detail is suboptimal due to sound attenuation. Hepatopetal portal venous flow. IVC: Normal appearance Pancreas: Portion of body and proximal tail normal appearance, remainder obscured by bowel gas Spleen: Normal appearance, 8.0 cm length Right Kidney: Length: 13.1 cm. Normal morphology without mass or hydronephrosis. Left Kidney: Length: 7.4 cm. Normal morphology without mass or hydronephrosis. Abdominal aorta: Proximal and mid portions normal caliber, distally obscured by bowel gas Other findings: No free fluid IMPRESSION: Probable fatty infiltration of liver as above. Contracted gallbladder, unable to assess; this could be due to prior food intake but gallbladder pathology is not excluded. Incomplete visualization of distal aorta and pancreas. Electronically Signed   By: Lavonia Dana M.D.   On: 01/28/2016 19:23   I have personally reviewed and evaluated these images and lab results as part of my medical decision-making.   EKG Interpretation   Date/Time:  Thursday January 28 2016 18:20:31 EDT Ventricular Rate:  77 PR Interval:    QRS Duration: 98 QT Interval:  372 QTC Calculation: 421 R Axis:   10 Text Interpretation:  Sinus rhythm Low voltage, precordial leads Abnormal  R-wave progression, early transition Confirmed by [facility] MD, JULIE  ([id number]) on 01/28/2016 6:49:29 PM      MDM   Final diagnoses:  Abdominal pain, unspecified abdominal location    Pt's care turned over Harlene Ramus PA for  disposition post labs    Fransico Meadow, PA-C 01/29/16 1929  Isla Pence, MD 02/03/16 [phone]

## 2016-01-28 NOTE — Discharge Instructions (Signed)
Continue taking your home medications as prescribed. Continue taking your zofran prescription as prescribed as needed for nausea. I recommend eating a bland diet for the next few days.  Follow up with your primary care provider in the next 4-5 days. Please return to the Emergency Department if symptoms worsen or new onset of fever, worsening abdominal pain, chest pain, difficulty breathing, vomiting, unable to keep fluids down, blood in stool.

## 2016-01-28 NOTE — ED Provider Notes (Signed)
Hand-off from [facility], [place]. D-dimer pending.  Briefly, pt is a 35 yo female with PMH of HTN, GERD, IBS, kidney stones who presents to the ED with complaint of RUQ pain, onset 2 days. [facility], noted to have elevation of LFTs. Pt reports today she has had pain in the right side of chest. Pt reports she now has painful abdomen, worse in the upper abdomen. Denies any associated sxs.   Physical Exam  BP 146/88 mmHg  Pulse 75  Temp(Src) 98.3 F (36.8 C) (Oral)  Resp 13  Ht 5' 6"  (1.676 m)  Wt 113.399 kg  BMI 40.37 kg/m2  SpO2 99%  LMP 01/01/2016  Physical Exam  Constitutional: She is oriented to person, place, and time. She appears well-developed and well-nourished.  HENT:  Head: Normocephalic and atraumatic.  Eyes: Conjunctivae and EOM are normal. Right eye exhibits no discharge. Left eye exhibits no discharge. No scleral icterus.  Neck: Normal range of motion. Neck supple.  Cardiovascular: Normal rate.   Pulmonary/Chest: Effort normal. No respiratory distress.  Abdominal: Soft. She exhibits no distension and no mass. There is tenderness (mild tenderness in RUQ and epigastric region). There is no rebound and no guarding.  Musculoskeletal: Normal range of motion.  Neurological: She is alert and oriented to person, place, and time.  Nursing note and vitals reviewed.   ED Course  Procedures  MDM Pt presents with abdominal and chest pain. Hx of HTN, GERD, IBS and kidney stones. Surgical hx includes appendectomy. VSS. Exam preformed by initial provider showed abdominal tenderness, remaining exam unremarkable. Pregnancy negative. WBC 13.4. AST 71, ALT 89. Remaining labs unremarkable. EKG showed sinus rhythm. Negative trop. CXR negative. US abdomen showed probably fatty infiltration of liver, contracted gallbladder likely due to food intake. D-dimer pending to evaluate for PE. If d-dimer negative, plan to d/c pt home with zofran, bowel rest and PCP follow  up.  On my initial evaluation, patient sitting in bed drinking ginger ale and eating crackers and peanut butter. VSS. Exam revealed mild tenderness over right upper quadrant and epigastric region, no peritoneal signs. Patient able tolerate by mouth. D-dimer negative. I suspect pt's sxs may likely be due to viral gastritis. Plan to d/c pt home with zofran (which she reports she has at home), bowel rest, oral hydration and PCP follow up. Discussed results and plan for discharge with patient. Advised patient to follow up with her PCP regarding her abdominal pain and elevated LFTs. Discussed return precautions with patient.      Chesley Noon [place], [place] 01/28/16 2202  Isla Pence, MD 01/28/16 2231

## 2016-01-28 NOTE — ED Notes (Signed)
Ems states pt is c/o abd x 2 days.

## 2016-02-03 ENCOUNTER — Encounter: Payer: Self-pay | Admitting: Gastroenterology

## 2016-02-03 ENCOUNTER — Ambulatory Visit (INDEPENDENT_AMBULATORY_CARE_PROVIDER_SITE_OTHER): Payer: BLUE CROSS/BLUE SHIELD | Admitting: Gastroenterology

## 2016-02-03 VITALS — BP 130/70 | HR 80 | Ht 66.0 in | Wt 254.0 lb

## 2016-02-03 DIAGNOSIS — R1011 Right upper quadrant pain: Secondary | ICD-10-CM | POA: Diagnosis not present

## 2016-02-03 DIAGNOSIS — K589 Irritable bowel syndrome without diarrhea: Secondary | ICD-10-CM

## 2016-02-03 DIAGNOSIS — R945 Abnormal results of liver function studies: Secondary | ICD-10-CM

## 2016-02-03 DIAGNOSIS — K219 Gastro-esophageal reflux disease without esophagitis: Secondary | ICD-10-CM

## 2016-02-03 DIAGNOSIS — K760 Fatty (change of) liver, not elsewhere classified: Secondary | ICD-10-CM

## 2016-02-03 DIAGNOSIS — R7989 Other specified abnormal findings of blood chemistry: Secondary | ICD-10-CM

## 2016-02-03 NOTE — Patient Instructions (Signed)
You have been scheduled for an abdominal ultrasound at [facility] (1st floor of hospital) on 02/10/16 at 9:00am. Please arrive 15 minutes prior to your appointment for registration. Make certain not to have anything to eat or drink after midnight. Should you need to reschedule your appointment, please contact radiology at [phone]. This test typically takes about 30 minutes to perform.  Normal BMI (Body Mass Index- based on height and weight) is between 19 and 25. Your BMI today is Body mass index is 41.02 kg/(m^2). Marland Kitchen Please consider follow up  regarding your BMI with your Primary Care Provider.  Thank you for choosing me and [facility].  Pricilla Riffle. Dagoberto Ligas., MD., Marval Regal

## 2016-02-03 NOTE — Progress Notes (Signed)
    History of Present Illness: This is a 35 year old female who relates a several week history of postprandial right upper quadrant pain that lasts from 1-2 hours following most meals. She does not note the symptoms with water. She was evaluated in the ED for the symptoms with abdominal ultrasound as below. Blood work revealed mild AST and ALT elevation. Symptoms have improved since her ED visit. In addition she has blood work from [facility] dated 01/27/2016 showing a mildly elevated AST and ALT. She states her current symptoms do not feel like her typical generalized abdominal cramping and bloating that she experiences from IBS. Her reflux symptoms are well controlled on her current regimen. Denies weight loss, constipation, diarrhea, change in stool caliber, melena, hematochezia, nausea, vomiting, dysphagia, reflux symptoms, chest pain.  Abdominal ultrasound 01/28/2016 IMPRESSION: Probable fatty infiltration of liver as above. Contracted gallbladder, unable to assess; this could be due to prior food intake but gallbladder pathology is not excluded. CBD=4 mm diameter Incomplete visualization of distal aorta and pancreas.  Current Medications, Allergies, Past Medical History, Past Surgical History, Family History and Social History were reviewed in [employer] record.  Physical Exam: General: Well developed, well nourished, obese, no acute distress Head: Normocephalic and atraumatic Eyes:  sclerae anicteric, EOMI Ears: Normal auditory acuity Mouth: No deformity or lesions Lungs: Clear throughout to auscultation Heart: Regular rate and rhythm; no murmurs, rubs or bruits Abdomen: Soft, mild RUQ tenderness and non distended. No masses, hepatosplenomegaly or hernias noted. Normal Bowel sounds Musculoskeletal: Symmetrical with no gross deformities  Pulses:  Normal pulses noted Extremities: No clubbing, cyanosis, edema or deformities noted Neurological:  Alert oriented x 4, grossly nonfocal Psychological:  Alert and cooperative. Normal mood and affect  Assessment and Recommendations:  1. Right upper quadrant pain. Etiology unclear. Inadequate imaging of the gallbladder on recent ultrasound. Repeat abdominal ultrasound with a longer fasting state to attempt to have an optimal gallbladder visualization. Change timing of dicyclomine to 1-2 hours before meals. Consider CCK HIDA and EGD if her repeat abdominal ultrasound does not show cholelithiasis. REV in 6 weeks.   2. Mildly elevated LFTs. Rule out biliary etiology or secondary to fatty liver. CBD is not dilated so choledocholithiasis is unlikely. Repeat LFTs in 6 weeks. Long-term weight loss program with low-fat, carb modified diet supervised by her primary physician.  3. GERD continue omeprazole 20 mg twice daily.  4. IBS. Dicyclomine 10 mg 4 times a day when necessary.  5. Lactose intolerance. Continue to avoid all lactose products.

## 2016-02-10 ENCOUNTER — Ambulatory Visit (HOSPITAL_COMMUNITY)
Admission: RE | Admit: 2016-02-10 | Discharge: 2016-02-10 | Disposition: A | Discharge status: Home / Self Care | Payer: BLUE CROSS/BLUE SHIELD | Source: Ambulatory Visit | Attending: Gastroenterology | Admitting: Gastroenterology

## 2016-02-10 DIAGNOSIS — R1011 Right upper quadrant pain: Secondary | ICD-10-CM | POA: Insufficient documentation

## 2016-02-10 DIAGNOSIS — R945 Abnormal results of liver function studies: Secondary | ICD-10-CM

## 2016-02-10 DIAGNOSIS — R7989 Other specified abnormal findings of blood chemistry: Secondary | ICD-10-CM | POA: Diagnosis not present

## 2016-02-10 DIAGNOSIS — K760 Fatty (change of) liver, not elsewhere classified: Secondary | ICD-10-CM | POA: Diagnosis not present

## 2016-02-22 MED FILL — DICYCLOMINE 20 MG TABLET: 20 | 30 days supply | Qty: 120 | Fill #1

## 2016-02-25 ENCOUNTER — Ambulatory Visit: Payer: BLUE CROSS/BLUE SHIELD | Attending: Internal Medicine | Admitting: Physician Assistant

## 2016-02-25 VITALS — BP 134/90 | HR 90 | Temp 98.2°F | Resp 16 | Wt 245.6 lb

## 2016-02-25 DIAGNOSIS — R739 Hyperglycemia, unspecified: Secondary | ICD-10-CM | POA: Diagnosis present

## 2016-02-25 DIAGNOSIS — L299 Pruritus, unspecified: Secondary | ICD-10-CM

## 2016-02-25 DIAGNOSIS — R5383 Other fatigue: Secondary | ICD-10-CM

## 2016-02-25 DIAGNOSIS — Z79899 Other long term (current) drug therapy: Secondary | ICD-10-CM | POA: Insufficient documentation

## 2016-02-25 DIAGNOSIS — R945 Abnormal results of liver function studies: Secondary | ICD-10-CM

## 2016-02-25 DIAGNOSIS — K760 Fatty (change of) liver, not elsewhere classified: Secondary | ICD-10-CM | POA: Diagnosis not present

## 2016-02-25 DIAGNOSIS — N189 Chronic kidney disease, unspecified: Secondary | ICD-10-CM | POA: Insufficient documentation

## 2016-02-25 DIAGNOSIS — I129 Hypertensive chronic kidney disease with stage 1 through stage 4 chronic kidney disease, or unspecified chronic kidney disease: Secondary | ICD-10-CM | POA: Insufficient documentation

## 2016-02-25 DIAGNOSIS — R52 Pain, unspecified: Secondary | ICD-10-CM

## 2016-02-25 DIAGNOSIS — R7989 Other specified abnormal findings of blood chemistry: Secondary | ICD-10-CM

## 2016-02-25 LAB — HEPATIC FUNCTION PANEL
ALBUMIN: 4.2 g/dL (ref 3.6–5.1)
ALK PHOS: 101 U/L (ref 33–115)
ALT: 189 U/L — AB (ref 6–29)
AST: 204 U/L — AB (ref 10–30)
BILIRUBIN DIRECT: 0.1 mg/dL (ref <=0.2)
Indirect Bilirubin: 0.3 mg/dL (ref 0.2–1.2)
Total Bilirubin: 0.4 mg/dL (ref 0.2–1.2)
Total Protein: 7.4 g/dL (ref 6.1–8.1)

## 2016-02-25 LAB — POCT GLYCOSYLATED HEMOGLOBIN (HGB A1C): HEMOGLOBIN A1C: 5.8

## 2016-02-25 LAB — THYROID PANEL WITH TSH
Free Thyroxine Index: 2.1 (ref 1.4–3.8)
T3 UPTAKE: 17 % — AB (ref 22–35)
T4 TOTAL: 12.1 ug/dL — AB (ref 4.5–12.0)
TSH: 2.24 mIU/L

## 2016-02-25 MED ORDER — CHOLESTYRAMINE LIGHT 4 G PO PACK: 4.0000 g | PACK | Freq: Two times a day (BID) | ORAL | Status: DC

## 2016-02-25 MED ORDER — CHOLESTYRAMINE LIGHT 4 G PO POWD: 4.0000 g | Freq: Two times a day (BID) | ORAL | 1 refills | Status: DC

## 2016-02-25 MED FILL — MELOXICAM 15 MG TABLET: 15 | 30 days supply | Qty: 30 | Fill #4

## 2016-02-25 MED FILL — CHOLESTYRAMINE LIGHT POWDER: 4 | 60 days supply | Qty: 60 | Fill #0

## 2016-02-25 MED FILL — FUROSEMIDE 40 MG TABLET: 40 | 30 days supply | Qty: 30 | Fill #3

## 2016-02-25 MED FILL — MONO-LINYAH 28 TABLET: 0.25-35 | 28 days supply | Qty: 28 | Fill #2

## 2016-02-25 MED FILL — ?OMEPRAZOLE DR 20 MG CAPSUL: 20 | 30 days supply | Qty: 90 | Fill #5

## 2016-02-25 MED FILL — ?LOSARTAN POTASSIUM 100 MG: 100 | 30 days supply | Qty: 30 | Fill #2

## 2016-02-25 MED FILL — MIRTAZAPINE 15 MG TABLET: 15 | 30 days supply | Qty: 30 | Fill #0

## 2016-02-25 NOTE — Progress Notes (Signed)
Pt is in the office today for hypertension Pt states her bp has been high for the past 2 months Pt states she has been taking her medication Pt states her bp goes high at random times

## 2016-02-25 NOTE — Progress Notes (Signed)
Patient ID: Stephanie Miles, female   DOB: 09-Jul-1981, 35 y.o.   MRN: [id number]   Stephanie Miles, is a 35 y.o. female  [id number]:[id number]  [id number]  DOB - [birth year]  Subjective:  Chief Complaint and HPI: Stephanie Miles is a 35 y.o. female here today for multiple concerns.  1.The infirmatory at her school checked her LFTs this week and said they are higher than before.  She does not have the results with her.  She does have known fatty liver disease that was diagnosed in March by Dr. Fuller Plan on U/S.  She denies any risk factors for any type of hepatitis.  Her LFTs have been elevated for a while. No N/V.  She has IBS and is "always having bowel issues."  2.  Generalized Pruritis X 2 months.   3.  Aches and pains all over her body with low grade temps ~99.  No tick bites. 4.  Concerned because she doesn't lose weight.  Says she doesn't eat much.  She does admit to drinking gatorade.  5.  Fatigue, changes in hair texture, increased acne X 62month 6.  She has a h/o bicuspid aortic valve and cardiology appointment 03/30/2016.  She is not symptomatic, however, her home BP readings have been up and down.  She did not bring in a BP log today.  Negative cardiac work-up for CP in ED in May 2017 with neg enzymes and no acute EKG changes.    She takes OCP to regulate the flow of her menses and make her periods regular    ROS:   Constitutional:  +low grade  Fever, no chills, No night sweats, No unexplained weight loss. EENT:  No vision changes, No blurry vision, No hearing changes. No mouth, throat, or ear problems.  Respiratory: No cough, No SOB Cardiac: No CP, no palpitations GI:  No abd pain, No N/V/D. GU: No Urinary s/sx Musculoskeletal: +body aches Neuro: No headache, no dizziness, no motor weakness.  Skin: generalized pruritis Endocrine:  No polydipsia. No polyuria.  Psych: Denies SI/HI  No problems updated.  ALLERGIES: Allergies  Allergen Reactions  . Codeine Nausea Only    Needs  antiemetic    PAST MEDICAL HISTORY: Past Medical History:  Diagnosis Date  . Allergy   . Anxiety   . Aortic regurgitation due to bicuspid aortic valve   . Asthma   . Chronic kidney disease    kidney stones  . Depression   . GERD (gastroesophageal reflux disease)   . Heart murmur   . Hypertension   . IBS (irritable bowel syndrome)   . Insomnia   . Migraine headache   . OCD (obsessive compulsive disorder)   . S/P appy 05/2013    MEDICATIONS AT HOME: Prior to Admission medications   Medication Sig Start Date End Date Taking? Authorizing Provider  cetirizine (ZYRTEC) 10 MG tablet Take 10 mg by mouth daily.   Yes Historical Provider, MD  dicyclomine (BENTYL) 20 MG tablet TAKE 1 TABLET BY MOUTH 4 TIMES DAILY BEFORE MEALS AND AT BEDTIME 01/15/16  Yes MLadene Artist MD  furosemide (LASIX) 40 MG tablet Take 1 tablet (40 mg total) by mouth every morning. 09/23/15  Yes VLance Bosch NP  losartan (COZAAR) 100 MG tablet Take 1 tablet (100 mg total) by mouth daily. 09/23/15  Yes VLance Bosch NP  meloxicam (MOBIC) 15 MG tablet Take 15 mg by mouth at bedtime.   Yes Historical Provider, MD  mirtazapine (REMERON) 15 MG tablet Take 15 mg  by mouth at bedtime.   Yes Historical Provider, MD  Multiple Vitamin (MULTIVITAMIN WITH MINERALS) TABS tablet Take 1 tablet by mouth daily.   Yes Historical Provider, MD  norgestimate-ethinyl estradiol (ORTHO-CYCLEN,SPRINTEC,PREVIFEM) 0.25-35 MG-MCG tablet Take 1 tablet by mouth every evening. 09/23/15  Yes Lance Bosch, NP  omeprazole (PRILOSEC) 20 MG capsule Take by mouth. Take 28m before breakfast and 233mbefore supper.   Yes Historical Provider, MD  potassium chloride (K-DUR,KLOR-CON) 10 MEQ tablet Take 10 mEq by mouth every other day.   Yes Historical Provider, MD  Prenatal Vit-Fe Sulfate-FA (PRENATAL VITAMIN PO) Take 1 tablet by mouth every evening.    Yes Historical Provider, MD  traZODone (DESYREL) 100 MG tablet Take 50-100 mg by mouth at bedtime.     Yes Historical Provider, MD  venlafaxine (EFFEXOR) 75 MG tablet Take 75-150 mg by mouth 2 (two) times daily. Takes 2 tabs in am and 1 tab in pm   Yes Historical Provider, MD  cholestyramine light 4 g POWD Take 1 packet (4 g total) by mouth 2 (two) times daily. 02/25/16   AnArgentina DonovanPA-C     Objective:  EXAM:   Vitals:   02/25/16 1202  BP: 134/90  Pulse: 90  Resp: 16  Temp: 98.2 F (36.8 C)  TempSrc: Oral  SpO2: 97%  Weight: 245 lb 9.6 oz (111.4 kg)    General appearance : A&OX3. NAD. Non-toxic-appearing HEENT: Atraumatic and Normocephalic.  PERRLA. EOM intact.  TM clear B. Mouth-MMM, post pharynx WNL w/o erythema, No PND. Neck: supple, no JVD. No cervical lymphadenopathy. No thyromegaly Chest/Lungs:  Breathing-non-labored, Good air entry bilaterally, breath sounds normal without rales, rhonchi, or wheezing  CVS: S1 S2 regular, <2/6 grade murmur Abdomen: Bowel sounds present, Non tender and not distended with no gaurding, rigidity or rebound. No megaly Extremities: Bilateral Lower Ext shows no edema, both legs are warm to touch with = pulse throughout Neurology:  CN II-XII grossly intact, Non focal.   Psych:  TP linear. J/I WNL. Normal speech. Appropriate eye contact and affect.  Skin:  No Rash, some acne  Data Review Lab Results  Component Value Date   HGBA1C 5.8 02/25/2016     Assessment & Plan   1. Hyperglycemia I have had a lengthy discussion and provided education about insulin resistance and the intake of too much sugar/refined carbohydrates.  I have advised the patient to work at a goal of eliminating sugary drinks, candy, desserts, sweets, refined sugars, processed foods, and white carbohydrates.  The patient expresses understanding.  - HgB A1c is normal today, but she is at risk and likely has metabolic syndrome  2. Other fatigue - Thyroid Panel With TSH  3. Elevated LFTs with known fatty liver Weight loss is going to be imperative - Hepatic Function  Panel - Sedimentation Rate  4. Body aches - Sedimentation Rate  5. Itching Likely caused by #3.  Chlolestyramine  6. Hypertension with fluctuations in home readings.  For now, continue current medications.  Consider stopping OCP. She is to check BP daily and record and bring to her f/up appointment with me. Keep cardiology appt.    Patient have been counseled extensively about nutrition and exercise  Return for htn and other problems with me.  The patient was given clear instructions to go to ER or return to medical center if symptoms don't improve, worsen or new problems develop. The patient verbalized understanding. The patient was told to call to get lab results if they  haven't heard anything in the next week.     Freeman Caldron, PA-C [facility] [place], [place]   02/25/2016, 4:31 PM

## 2016-02-26 ENCOUNTER — Telehealth: Payer: Self-pay

## 2016-02-26 LAB — SEDIMENTATION RATE: SED RATE: 22 mm/h — AB (ref 0–20)

## 2016-02-26 NOTE — Telephone Encounter (Signed)
Contacted pt to go over lab results. Pt did not answer lvm for pt to give me a call back at her earliest convenience

## 2016-02-26 NOTE — Telephone Encounter (Signed)
Pt returned call and is aware of results and doesn't have any questions or concerns

## 2016-03-23 MED FILL — MONO-LINYAH 28 TABLET: 0.25-35 | 28 days supply | Qty: 28 | Fill #3

## 2016-03-23 MED FILL — OMEPRAZOLE DR 20 MG CAPSULE: 20 | 30 days supply | Qty: 90 | Fill #6

## 2016-03-23 MED FILL — MELOXICAM 15 MG TABLET: 15 | 30 days supply | Qty: 30 | Fill #5

## 2016-03-23 MED FILL — ?MIRTAZAPINE 15 MG TABLET: 15 | 30 days supply | Qty: 30 | Fill #1

## 2016-03-29 ENCOUNTER — Ambulatory Visit: Payer: Self-pay | Admitting: Family Medicine

## 2016-03-30 ENCOUNTER — Ambulatory Visit (INDEPENDENT_AMBULATORY_CARE_PROVIDER_SITE_OTHER): Payer: Self-pay | Admitting: Interventional Cardiology

## 2016-03-30 ENCOUNTER — Encounter: Payer: Self-pay | Admitting: Interventional Cardiology

## 2016-03-30 VITALS — BP 140/80 | HR 98 | Ht 66.0 in | Wt 252.1 lb

## 2016-03-30 DIAGNOSIS — Q231 Congenital insufficiency of aortic valve: Secondary | ICD-10-CM

## 2016-03-30 DIAGNOSIS — I10 Essential (primary) hypertension: Secondary | ICD-10-CM

## 2016-03-30 DIAGNOSIS — R0600 Dyspnea, unspecified: Secondary | ICD-10-CM

## 2016-03-30 DIAGNOSIS — R002 Palpitations: Secondary | ICD-10-CM

## 2016-03-30 NOTE — Patient Instructions (Addendum)
Medication Instructions:  Same-no changes  Labwork: None  Testing/Procedures: Your physician has recommended that you wear a 24 hour holter monitor. Holter monitors are medical devices that record the heart's electrical activity. Doctors most often use these monitors to diagnose arrhythmias. Arrhythmias are problems with the speed or rhythm of the heartbeat. The monitor is a small, portable device. You can wear one while you do your normal daily activities. This is usually used to diagnose what is causing palpitations/syncope (passing out).    Follow-Up: Based on Holter monitor results.     If you need a refill on your cardiac medications before your next appointment, please call your pharmacy.

## 2016-03-30 NOTE — Progress Notes (Signed)
Cardiology Office Note   Date:  03/30/2016   ID:  Stephanie Miles, DOB Jun 04, 1981, MRN [id number]  PCP:  Lance Bosch, NP    Chief Complaint  Patient presents with  . Shortness of Breath    Per pt, BP hasnt been normal for about 6 months  . Fatigue     Wt Readings from Last 3 Encounters:  03/30/16 252 lb 1.9 oz (114.4 kg)  02/25/16 245 lb 9.6 oz (111.4 kg)  02/03/16 254 lb (115.2 kg)       History of Present Illness: Stephanie Miles is a 35 y.o. female  Who has had spells where she feels poorly, blurry vision, nausea, sweating, fast heart rate and finds that her BP is high ( 150-160).  Episodes are once a day and she feels that they are different than her typical anxiety attacks that happen daily.    Typically, BP is in the 130-140 range when she feels well.    She rides her bike everywhere.  SHe has DOE.  She can have SHOB at rest as well.  No chest pain.  She has sweating at night.  She uses CPAP.        Past Medical History:  Diagnosis Date  . Allergy   . Anxiety   . Aortic regurgitation due to bicuspid aortic valve   . Asthma   . Chronic kidney disease    kidney stones  . Depression   . GERD (gastroesophageal reflux disease)   . Heart murmur   . Hypertension   . IBS (irritable bowel syndrome)   . Insomnia   . Migraine headache   . OCD (obsessive compulsive disorder)   . S/P appy 05/2013    Past Surgical History:  Procedure Laterality Date  . DENTAL SURGERY     wisdom teeth  . LAPAROSCOPIC APPENDECTOMY N/A 06/06/2013   Procedure: APPENDECTOMY LAPAROSCOPIC;  Surgeon: Imogene Burn. Georgette Dover, MD;  Location: [facility];  Service: General;  Laterality: N/A;     Current Outpatient Prescriptions  Medication Sig Dispense Refill  . cetirizine (ZYRTEC) 10 MG tablet Take 10 mg by mouth daily.    . cholestyramine light 4 g POWD Take 1 packet (4 g total) by mouth 2 (two) times daily. 60 packet 1  . dicyclomine (BENTYL) 20 MG tablet TAKE 1 TABLET BY MOUTH 4 TIMES  DAILY BEFORE MEALS AND AT BEDTIME 120 tablet 0  . furosemide (LASIX) 40 MG tablet Take 1 tablet (40 mg total) by mouth every morning. 30 tablet 4  . losartan (COZAAR) 100 MG tablet Take 1 tablet (100 mg total) by mouth daily. 30 tablet 4  . meloxicam (MOBIC) 15 MG tablet Take 15 mg by mouth at bedtime.    . mirtazapine (REMERON) 15 MG tablet Take 15 mg by mouth at bedtime.    . norgestimate-ethinyl estradiol (ORTHO-CYCLEN,SPRINTEC,PREVIFEM) 0.25-35 MG-MCG tablet Take 1 tablet by mouth every evening. 1 Package 11  . omeprazole (PRILOSEC) 20 MG capsule Take by mouth. Take 65m before breakfast and 219mbefore supper.    . potassium chloride (K-DUR,KLOR-CON) 10 MEQ tablet Take 10 mEq by mouth every other day.    . Prenatal Vit-Fe Sulfate-FA (PRENATAL VITAMIN PO) Take 1 tablet by mouth every evening.     . traZODone (DESYREL) 50 MG tablet Take 50-100 mg by mouth at bedtime.  2  . venlafaxine (EFFEXOR) 75 MG tablet Take 75-150 mg by mouth 2 (two) times daily.      No current facility-administered medications  for this visit.     Allergies:   Codeine    Social History:  The patient  reports that she quit smoking about 3 years ago. Her smoking use included Cigarettes. She has a 20.00 pack-year smoking history. She has never used smokeless tobacco. She reports that she drinks alcohol. She reports that she does not use drugs.   Family History:  The patient's family history includes Asthma in her mother, sister, and sister; Breast cancer in her father; Cervical cancer in her maternal aunt; Colon cancer in her maternal grandmother; Diabetes in her father; Heart disease in her father; Hyperlipidemia in her brother and father; Hypertension in her brother, father, and mother; Liver cancer in her maternal grandmother; Melanoma in her maternal uncle; Vision loss in her brother.    ROS:  Please see the history of present illness.   Otherwise, review of systems are positive for anxiety and spells as noted above.    All other systems are reviewed and negative.    PHYSICAL EXAM: VS:  BP 140/80   Pulse 98   Ht 5' 6"  (1.676 m)   Wt 252 lb 1.9 oz (114.4 kg)   SpO2 98%   BMI 40.69 kg/m  , BMI Body mass index is 40.69 kg/m. GEN: Well nourished, well developed, in no acute distress  HEENT: normal  Neck: no JVD, carotid bruits, or masses Cardiac: ZWC;5/8 systolic early murmur, no rubs, or gallops,no edema  Respiratory:  clear to auscultation bilaterally, normal work of breathing GI: soft, nontender, nondistended, + BS, obese MS: no deformity or atrophy  Skin: warm and dry, no rash Neuro:  Strength and sensation are intact Psych: euthymic mood, full affect   EKG:   The ekg ordered today demonstrates NSR, no ST segment changes   Recent Labs: 01/28/2016: BUN 10; Creatinine, Ser 0.95; Hemoglobin 12.9; Platelets 314; Potassium 4.0; Sodium 136 02/25/2016: ALT 189; TSH 2.24   Lipid Panel No results found for: CHOL, TRIG, HDL, CHOLHDL, VLDL, LDLCALC, LDLDIRECT   Other studies Reviewed: Additional studies/ records that were reviewed today with results demonstrating: echo in 2017 reviewed.   ASSESSMENT AND PLAN:  1. Bicuspid aortic valve/AI: Follow with serial echo.   2. HTN: Add carvedilol 6.25 mg BID to see if this helps with BP and with her palpitations feeling, after she wears a 24 hour Holter or event monitor, whatever her finances allow. 3. DOE: Normal LV function by echo.  No severe valvular restriction.  She has seen pulmonary and was treated for GERD. 4. Edema: Intermittent.  Will hold off on amlodipine.  It was tried in the past and worsened her edema per her report.  Lisinopril was tried in the past with HCTZ in her 37s.  When lisinopril was used more recently, she had a cough.  5. Obesity: she is making dietary changes.  She is reducing portion sizes as well.     Current medicines are reviewed at length with the patient today.  The patient concerns regarding her medicines were  addressed.  The following changes have been made:  No change  Labs/ tests ordered today include: Holter No orders of the defined types were placed in this encounter.   Recommend 150 minutes/week of aerobic exercise Low fat, low carb, high fiber diet recommended  Disposition:   FU in based on rhythm monitoring   Signed, Larae Grooms, MD  03/30/2016 11:35 AM    [address], [place], [place]  [address] Phone: [phone])  [phone]; Fax: [phone]

## 2016-04-21 MED FILL — MONO-LINYAH 28 TABLET: 0.25-35 | 28 days supply | Qty: 28 | Fill #4

## 2016-05-10 ENCOUNTER — Other Ambulatory Visit: Payer: Self-pay | Admitting: Gastroenterology

## 2016-05-10 MED FILL — MONO-LINYAH 28 TABLET: 0.25-35 | 28 days supply | Qty: 28 | Fill #5

## 2016-05-10 MED FILL — traZODone HCL 50 MG TABS: 50 | 30 days supply | Qty: 60 | Fill #0

## 2016-05-10 MED FILL — MIRTAZAPINE 15 MG TABLET: 15 | 30 days supply | Qty: 30 | Fill #2

## 2016-05-10 MED FILL — FUROSEMIDE 40 MG TABLET: 40 | 30 days supply | Qty: 30 | Fill #4

## 2016-05-10 MED FILL — ?OMEPRAZOLE DR 20 MG CAPSUL: 20 | 30 days supply | Qty: 90 | Fill #7

## 2016-05-10 MED FILL — LOSARTAN POTASSIUM 100 MG T: 100 | 30 days supply | Qty: 30 | Fill #3

## 2016-05-11 MED FILL — DICYCLOMINE 20 MG TABLET: 20 | 30 days supply | Qty: 120 | Fill #0

## 2016-05-11 MED FILL — MELOXICAM 15 MG TABLET: 15 | 30 days supply | Qty: 30 | Fill #0

## 2016-06-08 MED FILL — MONO-LINYAH 28 TABLET: 0.25-35 | 28 days supply | Qty: 28 | Fill #6

## 2016-07-04 MED FILL — OMEPRAZOLE DR 20 MG CAPSULE: 20 | 30 days supply | Qty: 90 | Fill #8

## 2016-07-04 MED FILL — MELOXICAM 15 MG TABLET: 15 | 30 days supply | Qty: 30 | Fill #1

## 2016-07-04 MED FILL — MONO-LINYAH 28 TABLET: 0.25-35 | 28 days supply | Qty: 28 | Fill #7

## 2016-07-04 MED FILL — traZODone HCL 50 MG TABS: 50 | 30 days supply | Qty: 60 | Fill #1

## 2016-08-05 MED FILL — MONO-LINYAH 28 TABLET: 0.25-35 | 28 days supply | Qty: 28 | Fill #8

## 2016-08-05 MED FILL — MELOXICAM 15 MG TABLET: 15 | 30 days supply | Qty: 30 | Fill #2 | Status: TO

## 2016-08-05 MED FILL — OMEPRAZOLE DR 20 MG CAPSULE: 20 | 30 days supply | Qty: 90 | Fill #9

## 2016-08-08 ENCOUNTER — Telehealth: Payer: Self-pay | Admitting: *Deleted

## 2016-08-08 NOTE — Telephone Encounter (Signed)
Ok to cancel monitor order per [employer] below.   06/29/16 LMOM for pt to return call to let us know if she has paperwork filled out so we can schedule monitor/lm  05/25/2016 LMOM for pt to call back to see what the status is. we need her life watch paper work to send in for assistance. stpegram  04/25/2016 spoke with pt she states she can not get the monitor at this time that she is homeless and doesnt know where the paper work is. once she can she will come get more paper work and apply for the monitor. stpegram  03/30/16 PATIENT WAS GIVEN HARDSHIP PAPER TO FILL OUT AND RETURN/D.Brady Plant  07/21/16 REMAILED HARDSHOP PAPER TO THE PATIENT NEW ADDRESS/D.Ermal Brzozowski  1/10 AND 08/05/16 A message went to the nuse to see if we can remove this [email or web address]

## 2016-09-09 ENCOUNTER — Other Ambulatory Visit: Payer: Self-pay | Admitting: Internal Medicine

## 2016-09-25 ENCOUNTER — Emergency Department (HOSPITAL_BASED_OUTPATIENT_CLINIC_OR_DEPARTMENT_OTHER)
Admission: EM | Admit: 2016-09-25 | Discharge: 2016-09-26 | Disposition: A | Discharge status: Home / Self Care | Payer: Self-pay | Attending: Emergency Medicine | Admitting: Emergency Medicine

## 2016-09-25 ENCOUNTER — Encounter (HOSPITAL_BASED_OUTPATIENT_CLINIC_OR_DEPARTMENT_OTHER): Payer: Self-pay | Admitting: Emergency Medicine

## 2016-09-25 ENCOUNTER — Emergency Department (HOSPITAL_BASED_OUTPATIENT_CLINIC_OR_DEPARTMENT_OTHER): Payer: Self-pay

## 2016-09-25 DIAGNOSIS — Z87891 Personal history of nicotine dependence: Secondary | ICD-10-CM | POA: Insufficient documentation

## 2016-09-25 DIAGNOSIS — J45909 Unspecified asthma, uncomplicated: Secondary | ICD-10-CM | POA: Insufficient documentation

## 2016-09-25 DIAGNOSIS — I129 Hypertensive chronic kidney disease with stage 1 through stage 4 chronic kidney disease, or unspecified chronic kidney disease: Secondary | ICD-10-CM | POA: Insufficient documentation

## 2016-09-25 DIAGNOSIS — N201 Calculus of ureter: Secondary | ICD-10-CM | POA: Insufficient documentation

## 2016-09-25 DIAGNOSIS — Z79899 Other long term (current) drug therapy: Secondary | ICD-10-CM | POA: Insufficient documentation

## 2016-09-25 DIAGNOSIS — N189 Chronic kidney disease, unspecified: Secondary | ICD-10-CM | POA: Insufficient documentation

## 2016-09-25 LAB — URINALYSIS, ROUTINE W REFLEX MICROSCOPIC
BILIRUBIN URINE: NEGATIVE
Glucose, UA: NEGATIVE mg/dL
KETONES UR: NEGATIVE mg/dL
Leukocytes, UA: NEGATIVE
NITRITE: NEGATIVE
PH: 5.5 (ref 5.0–8.0)
Protein, ur: NEGATIVE mg/dL
Specific Gravity, Urine: 1.025 (ref 1.005–1.030)

## 2016-09-25 LAB — CBC WITH DIFFERENTIAL/PLATELET
Basophils Absolute: 0 10*3/uL (ref 0.0–0.1)
Basophils Relative: 0 %
EOS PCT: 3 %
Eosinophils Absolute: 0.6 10*3/uL (ref 0.0–0.7)
HEMATOCRIT: 42.8 % (ref 36.0–46.0)
Hemoglobin: 14.4 g/dL (ref 12.0–15.0)
LYMPHS ABS: 6.9 10*3/uL — AB (ref 0.7–4.0)
Lymphocytes Relative: 34 %
MCH: 28.6 pg (ref 26.0–34.0)
MCHC: 33.6 g/dL (ref 30.0–36.0)
MCV: 85.1 fL (ref 78.0–100.0)
MONO ABS: 1.6 10*3/uL — AB (ref 0.1–1.0)
Monocytes Relative: 8 %
NEUTROS ABS: 11.3 10*3/uL — AB (ref 1.7–7.7)
Neutrophils Relative %: 55 %
PLATELETS: 390 10*3/uL (ref 150–400)
RBC: 5.03 MIL/uL (ref 3.87–5.11)
RDW: 14.5 % (ref 11.5–15.5)
WBC: 20.4 10*3/uL — AB (ref 4.0–10.5)

## 2016-09-25 LAB — BASIC METABOLIC PANEL
ANION GAP: 8 (ref 5–15)
BUN: 11 mg/dL (ref 6–20)
CALCIUM: 9.1 mg/dL (ref 8.9–10.3)
CO2: 25 mmol/L (ref 22–32)
CREATININE: 0.94 mg/dL (ref 0.44–1.00)
Chloride: 104 mmol/L (ref 101–111)
GFR calc Af Amer: >60 mL/min (ref >60)
GLUCOSE: 109 mg/dL — AB (ref 65–99)
Potassium: 3.8 mmol/L (ref 3.5–5.1)
Sodium: 137 mmol/L (ref 135–145)

## 2016-09-25 LAB — PREGNANCY, URINE: Preg Test, Ur: NEGATIVE

## 2016-09-25 LAB — URINALYSIS, MICROSCOPIC (REFLEX)

## 2016-09-25 MED ORDER — SODIUM CHLORIDE 0.9 % IV BOLUS (SEPSIS)
1000.0000 mL | Freq: Once | INTRAVENOUS | Status: AC
  Administered 2016-09-25: 1000 mL via INTRAVENOUS

## 2016-09-25 MED ORDER — HYDROMORPHONE HCL 1 MG/ML IJ SOLN
1.0000 mg | Freq: Once | INTRAMUSCULAR | Status: AC
  Administered 2016-09-25: 1 mg via INTRAVENOUS
  Filled 2016-09-25: qty 1

## 2016-09-25 MED ORDER — ONDANSETRON HCL 4 MG/2ML IJ SOLN
4.0000 mg | Freq: Once | INTRAMUSCULAR | Status: AC
  Administered 2016-09-25: 4 mg via INTRAVENOUS
  Filled 2016-09-25: qty 2

## 2016-09-25 MED ORDER — MORPHINE SULFATE (PF) 4 MG/ML IV SOLN
4.0000 mg | Freq: Once | INTRAVENOUS | Status: AC
  Administered 2016-09-25: 4 mg via INTRAVENOUS
  Filled 2016-09-25: qty 1

## 2016-09-25 MED ORDER — KETOROLAC TROMETHAMINE 30 MG/ML IJ SOLN
30.0000 mg | Freq: Once | INTRAMUSCULAR | Status: AC
  Administered 2016-09-25: 30 mg via INTRAVENOUS
  Filled 2016-09-25: qty 1

## 2016-09-25 NOTE — ED Notes (Signed)
Patient transported to CT 

## 2016-09-25 NOTE — ED Triage Notes (Signed)
"   I have not been able to pee except for a couple drops for the past 5 hrs and my back is killing me" Appears ill

## 2016-09-25 NOTE — ED Provider Notes (Signed)
[facility] Provider Note   CSN: [id number] Arrival date & time: 09/25/16  2139  By signing my name below, I, Gwenlyn Fudge, attest that this documentation has been prepared under the direction and in the presence of Eliezer Mccoy, PA-C. Electronically Signed: Gwenlyn Fudge, ED Scribe. 09/25/16. 10:40 PM.  History   Chief Complaint Chief Complaint  Patient presents with  . Flank Pain   The history is provided by the patient. No language interpreter was used.   HPI Comments: Stephanie Miles is a 36 y.o. female who presents to the Emergency Department complaining of gradual onset, waxing and waning, 10/10 right sided flank pain for 5 hours. Pt reports associated right lower abdominal pain, nausea, sharp pelvic pain, urinary urgency, and difficulty urinating. She has not been able to urinate for the past 5 hours. Pt states pain is similar in nature, but worse in severity to prior kidney stones. Last kidney stone was 6-8 months ago. Abdominal PSHx included appendectomy. She denies fever, vomiting, vaginal discharge, vaginal bleeding, or any other complaints at this time.   Past Medical History:  Diagnosis Date  . Allergy   . Anxiety   . Aortic regurgitation due to bicuspid aortic valve   . Asthma   . Chronic kidney disease    kidney stones  . Depression   . GERD (gastroesophageal reflux disease)   . Heart murmur   . Hypertension   . IBS (irritable bowel syndrome)   . Insomnia   . Migraine headache   . OCD (obsessive compulsive disorder)   . S/P appy 05/2013    Patient Active Problem List   Diagnosis Date Noted  . Palpitations 03/30/2016  . Cough variant asthma vs uacs  07/07/2015  . Severe obesity (BMI >= 40) ([place]) 07/07/2015  . Dyspnea 07/07/2015  . Asthma 05/27/2015  . Aortic regurgitation due to bicuspid aortic valve 03/12/2014  . Bilateral lower extremity edema 03/12/2014  . Personal history of tobacco use, presenting hazards to health 01/20/2014  . Anxiety  state, unspecified 10/16/2013  . Ileus ([place]) 07/08/2013  . Migraine 07/08/2013  . Sleep apnea 07/08/2013  . UTI (urinary tract infection) 07/08/2013  . IBS (irritable bowel syndrome) 06/06/2013  . GERD (gastroesophageal reflux disease) 06/06/2013  . HTN (hypertension) 06/06/2013    Past Surgical History:  Procedure Laterality Date  . DENTAL SURGERY     wisdom teeth  . LAPAROSCOPIC APPENDECTOMY N/A 06/06/2013   Procedure: APPENDECTOMY LAPAROSCOPIC;  Surgeon: Imogene Burn. Georgette Dover, MD;  Location: [facility];  Service: General;  Laterality: N/A;    OB History    No data available       Home Medications    Prior to Admission medications   Medication Sig Start Date End Date Taking? Authorizing Provider  meloxicam (MOBIC) 15 MG tablet Take 15 mg by mouth at bedtime.   Yes Historical Provider, MD  mirtazapine (REMERON) 15 MG tablet Take 15 mg by mouth at bedtime.   Yes Historical Provider, MD  norgestimate-ethinyl estradiol (ORTHO-CYCLEN,SPRINTEC,PREVIFEM) 0.25-35 MG-MCG tablet Take 1 tablet by mouth every evening. 09/23/15  Yes Lance Bosch, NP  omeprazole (PRILOSEC) 20 MG capsule Take by mouth. Take 65m before breakfast and 225mbefore supper.   Yes Historical Provider, MD  Prenatal Vit-Fe Sulfate-FA (PRENATAL VITAMIN PO) Take 1 tablet by mouth every evening.    Yes Historical Provider, MD  traZODone (DESYREL) 50 MG tablet Take 50-100 mg by mouth at bedtime. 01/15/16  Yes Historical Provider, MD  cetirizine (ZYRTEC) 10 MG  tablet Take 10 mg by mouth daily.    Historical Provider, MD  cholestyramine light 4 g POWD Take 1 packet (4 g total) by mouth 2 (two) times daily. 02/25/16   Argentina Donovan, PA-C  dicyclomine (BENTYL) 20 MG tablet TAKE 1 TABLET BY MOUTH 4 TIMES DAILY BEFORE MEALS AND AT BEDTIME 05/10/16   Ladene Artist, MD  furosemide (LASIX) 40 MG tablet Take 1 tablet (40 mg total) by mouth every morning. 09/23/15   Lance Bosch, NP  ibuprofen (ADVIL,MOTRIN) 800 MG tablet Take 1 tablet  (800 mg total) by mouth 3 (three) times daily. 09/26/16   Frederica Kuster, PA-C  losartan (COZAAR) 100 MG tablet Take 1 tablet (100 mg total) by mouth daily. 09/23/15   Lance Bosch, NP  ondansetron (ZOFRAN) 4 MG tablet Take 1 tablet (4 mg total) by mouth every 6 (six) hours. 09/26/16   Frederica Kuster, PA-C  oxyCODONE-acetaminophen (PERCOCET/ROXICET) 5-325 MG tablet Take 1-2 tablets by mouth every 6 (six) hours as needed for severe pain. 09/26/16   Frederica Kuster, PA-C  potassium chloride (K-DUR,KLOR-CON) 10 MEQ tablet Take 10 mEq by mouth every other day.    Historical Provider, MD  tamsulosin (FLOMAX) 0.4 MG CAPS capsule Take 1 capsule (0.4 mg total) by mouth daily after supper. 09/26/16   Frederica Kuster, PA-C  venlafaxine (EFFEXOR) 75 MG tablet Take 75-150 mg by mouth 2 (two) times daily.     Historical Provider, MD    Family History Family History  Problem Relation Age of Onset  . Asthma Mother   . Hypertension Mother   . Breast cancer Father   . Diabetes Father   . Hypertension Father   . Heart disease Father   . Hyperlipidemia Father   . Asthma Sister   . Hyperlipidemia Brother   . Hypertension Brother   . Vision loss Brother   . Colon cancer Maternal Grandmother   . Liver cancer Maternal Grandmother   . Asthma Sister   . Cervical cancer Maternal Aunt   . Melanoma Maternal Uncle   . Esophageal cancer Neg Hx   . Pancreatic cancer Neg Hx   . Rectal cancer Neg Hx   . Stomach cancer Neg Hx     Social History Social History  Substance Use Topics  . Smoking status: Former Smoker    Packs/day: 1.00    Years: 20.00    Types: Cigarettes    Quit date: 02/22/2013  . Smokeless tobacco: Never Used  . Alcohol use 0.0 oz/week     Comment: social     Allergies   Codeine   Review of Systems Review of Systems  Constitutional: Negative for chills and fever.  HENT: Negative for facial swelling and sore throat.   Respiratory: Negative for shortness of breath.   Cardiovascular:  Negative for chest pain.  Gastrointestinal: Positive for abdominal pain and nausea. Negative for vomiting.  Genitourinary: Positive for difficulty urinating, flank pain, pelvic pain and urgency. Negative for dysuria, vaginal bleeding and vaginal discharge.  Musculoskeletal: Negative for back pain.  Skin: Negative for rash and wound.  Neurological: Negative for headaches.  Psychiatric/Behavioral: The patient is not nervous/anxious.    Physical Exam Updated Vital Signs BP 153/100 (BP Location: Left Arm)   Pulse 91   Temp 98.3 F (36.8 C) (Oral)   Resp (!) 28   Ht 5' 6"  (1.676 m)   Wt 113.4 kg   LMP 09/14/2016 (Exact Date)   SpO2 97%  BMI 40.35 kg/m   Physical Exam  Constitutional: She appears well-developed and well-nourished. She appears distressed (clearly distressed).  HENT:  Head: Normocephalic and atraumatic.  Mouth/Throat: Oropharynx is clear and moist. No oropharyngeal exudate.  Eyes: Conjunctivae are normal. Pupils are equal, round, and reactive to light. Right eye exhibits no discharge. Left eye exhibits no discharge. No scleral icterus.  Neck: Normal range of motion. Neck supple. No thyromegaly present.  Cardiovascular: Normal rate, regular rhythm, normal heart sounds and intact distal pulses.  Exam reveals no gallop and no friction rub.   No murmur heard. Pulmonary/Chest: Effort normal and breath sounds normal. No stridor. No respiratory distress. She has no wheezes. She has no rales.  Abdominal: Soft. Bowel sounds are normal. She exhibits no distension. There is tenderness in the right lower quadrant. There is no rebound, no guarding and no CVA tenderness (No CVA tenderness bilaterally).  Musculoskeletal: She exhibits no edema.  Right flank tenderness  Lymphadenopathy:    She has no cervical adenopathy.  Neurological: She is alert. Coordination normal.  Skin: Skin is warm and dry. No rash noted. She is not diaphoretic. No pallor.  Psychiatric: She has a normal mood  and affect.  Nursing note and vitals reviewed.  ED Treatments / Results  DIAGNOSTIC STUDIES: Oxygen Saturation is 99% on RA, normal by my interpretation.    COORDINATION OF CARE: 10:34 PM Discussed treatment plan with pt at bedside which includes IV Fluids, morphine injection, and Zofran and pt agreed to plan.  Labs (all labs ordered are listed, but only abnormal results are displayed) Labs Reviewed  URINALYSIS, ROUTINE W REFLEX MICROSCOPIC - Abnormal; Notable for the following:       Result Value   APPearance CLOUDY (*)    Hgb urine dipstick TRACE (*)    All other components within normal limits  CBC WITH DIFFERENTIAL/PLATELET - Abnormal; Notable for the following:    WBC 20.4 (*)    Neutro Abs 11.3 (*)    Lymphs Abs 6.9 (*)    Monocytes Absolute 1.6 (*)    All other components within normal limits  BASIC METABOLIC PANEL - Abnormal; Notable for the following:    Glucose, Bld 109 (*)    All other components within normal limits  URINALYSIS, MICROSCOPIC (REFLEX) - Abnormal; Notable for the following:    Bacteria, UA FEW (*)    Squamous Epithelial / LPF 6-30 (*)    All other components within normal limits  PREGNANCY, URINE    EKG  EKG Interpretation None       Radiology Ct Renal Stone Study  Result Date: 09/25/2016 CLINICAL DATA:  Right flank pain and dysuria x5 hours EXAM: CT ABDOMEN AND PELVIS WITHOUT CONTRAST TECHNIQUE: Multidetector CT imaging of the abdomen and pelvis was performed following the standard protocol without IV contrast. COMPARISON:  None. FINDINGS: Lower chest: No acute abnormality. Hepatobiliary: No focal liver abnormality is seen. No gallstones, gallbladder wall thickening, or biliary dilatation. Pancreas: Unremarkable. No pancreatic ductal dilatation or surrounding inflammatory changes. Spleen: Normal in size without focal abnormality. Adrenals/Urinary Tract: Normal bilateral adrenal glands. Tiny 2 mm distal right ureteral stone causing mild  hydronephrosis and hydroureter on the right with faint perinephric fat stranding. The calculus is at least 1.5 cm from ureterovesical junction. Bilateral nonobstructing renal calculi are noted. Bladder is contracted in appearance. Stomach/Bowel: Appendectomy.  No bowel obstruction or inflammation. Vascular/Lymphatic: No significant vascular findings are present. No enlarged abdominal or pelvic lymph nodes. Reproductive: Uterus and bilateral adnexa are  unremarkable. Other: Tiny fat containing umbilical hernia. No ascites or pneumoperitoneum. Musculoskeletal: No acute or significant osseous findings. IMPRESSION: 1. Mild right-sided perinephric fat stranding and hydroureteronephrosis secondary to a 2 mm distal right ureteral stone approximately 1.5 cm from the UVJ. 2. Bilateral nonobstructing renal calculi are noted. 3. Status post appendectomy. 4. Tiny fat containing umbilical hernia. Electronically Signed   By: Ashley Royalty M.D.   On: 09/25/2016 23:25    Procedures Procedures (including critical care time)  Medications Ordered in ED Medications  ondansetron (ZOFRAN) injection 4 mg (4 mg Intravenous Given 09/25/16 2206)  sodium chloride 0.9 % bolus 1,000 mL (0 mLs Intravenous Stopped 09/25/16 2345)  morphine 4 MG/ML injection 4 mg (4 mg Intravenous Given 09/25/16 2250)  ketorolac (TORADOL) 30 MG/ML injection 30 mg (30 mg Intravenous Given 09/25/16 2352)  HYDROmorphone (DILAUDID) injection 1 mg (1 mg Intravenous Given 09/25/16 2353)     Initial Impression / Assessment and Plan / ED Course  I have reviewed the triage vital signs and the nursing notes.  Pertinent labs & imaging results that were available during my care of the patient were reviewed by me and considered in my medical decision making (see chart for details).     Patient with 2 mm stone in right ureter 1.5 cm from the UVJ. CT renal stone study shows mild right-sided perinephric fat stranding and hydroureteronephrosis secondary to stone;  bilateral nonobstructing renal calculi noted; s/p appendectomy; tiny fat containing umbilical hernia. CBC shows WBC 20.4, most likely reactive. BMP shows normal BUN/creatinine. UA shows trace hematuria, no leukocytes. Urine pregnancy negative. Pain controlled in ED with morphine, Dilaudid, Toradol. Patient appearing much better on reevaluation. We'll discharge home with Flomax, Percocet, ibuprofen, Zofran. I reviewed narcotic database and found discrepancies. Patient advised to drink plenty of fluids, especially water, and to avoid sugary and artificial sugary drinks. Follow-up to urology within 1 week. Return precautions discussed. Patient understands and agrees with plan. Patient discharged in satisfactory condition. Patient also evaluated by Dr. Ashok Cordia who guided the patient's management and agrees with plan.  Final Clinical Impressions(s) / ED Diagnoses   Final diagnoses:  Ureterolithiasis    New Prescriptions New Prescriptions   IBUPROFEN (ADVIL,MOTRIN) 800 MG TABLET    Take 1 tablet (800 mg total) by mouth 3 (three) times daily.   ONDANSETRON (ZOFRAN) 4 MG TABLET    Take 1 tablet (4 mg total) by mouth every 6 (six) hours.   OXYCODONE-ACETAMINOPHEN (PERCOCET/ROXICET) 5-325 MG TABLET    Take 1-2 tablets by mouth every 6 (six) hours as needed for severe pain.   TAMSULOSIN (FLOMAX) 0.4 MG CAPS CAPSULE    Take 1 capsule (0.4 mg total) by mouth daily after supper.  I personally performed the services described in this documentation, which was scribed in my presence. The recorded information has been reviewed and is accurate.       [address] PA-C 09/26/16 [place], MD 09/26/16 [phone]

## 2016-09-26 LAB — PATHOLOGIST SMEAR REVIEW

## 2016-09-26 MED ORDER — TAMSULOSIN HCL 0.4 MG PO CAPS: 0.4000 mg | ORAL_CAPSULE | Freq: Every day | ORAL | 0 refills | Status: DC

## 2016-09-26 MED ORDER — ONDANSETRON HCL 4 MG PO TABS: 4.0000 mg | ORAL_TABLET | Freq: Four times a day (QID) | ORAL | 0 refills | Status: DC

## 2016-09-26 MED ORDER — IBUPROFEN 800 MG PO TABS: 800.0000 mg | ORAL_TABLET | Freq: Three times a day (TID) | ORAL | 0 refills | Status: DC

## 2016-09-26 MED ORDER — OXYCODONE-ACETAMINOPHEN 5-325 MG PO TABS: 1.0000 | ORAL_TABLET | Freq: Four times a day (QID) | ORAL | 0 refills | Status: DC | PRN

## 2016-09-26 MED FILL — MELOXICAM 15 MG TABLET: 15 | 30 days supply | Qty: 30 | Fill #0 | Status: TO

## 2016-09-26 MED FILL — TAMSULOSIN HCL 0.4 MG CAP: 0.4 | 7 days supply | Qty: 7 | Fill #0

## 2016-09-26 MED FILL — ONDANSETRON HCL 4 MG TABLET: 4 | 3 days supply | Qty: 12 | Fill #0

## 2016-09-26 MED FILL — OXYCODONE/APAP 5/325 MG TAB: 5-325 | 2 days supply | Qty: 15 | Fill #0

## 2016-09-26 NOTE — ED Notes (Signed)
Pt discharged to home NAD.

## 2016-09-26 NOTE — Discharge Instructions (Signed)
Medications: Percocet, Zofran, Flomax  Treatment: Take ibuprofen every 8 hours as needed for pain. Take 1-2 Percocet every 4-6 hours as needed for severe pain. Do not drive or operate machinery when taking Percocet. Take Zofran every 6 hours as needed for nausea or vomiting. Take Flomax once daily after dinner before bed to help with passage of stone. Make sure to drink plenty of water.  Follow-up: Please follow-up with Dr. Tresa Moore, a urologist, within one week for further evaluation and treatment of your kidney stones. Please return to emergency department if he develop any new or worsening symptoms including intractable vomiting, fever, severe pain that is not responding to your at home pain medications, or any other new or concerning symptoms.

## 2016-10-14 ENCOUNTER — Telehealth: Payer: Self-pay

## 2016-10-14 NOTE — Telephone Encounter (Signed)
Pre Visit Call made. Left message for patient to return call today if possible.

## 2016-10-17 ENCOUNTER — Encounter: Payer: Self-pay | Admitting: Family

## 2016-10-17 ENCOUNTER — Ambulatory Visit (INDEPENDENT_AMBULATORY_CARE_PROVIDER_SITE_OTHER): Payer: Managed Care, Other (non HMO) | Admitting: Family

## 2016-10-17 VITALS — BP 130/87 | HR 98 | Temp 98.8°F | Resp 16 | Ht 66.0 in | Wt 247.4 lb

## 2016-10-17 DIAGNOSIS — G43909 Migraine, unspecified, not intractable, without status migrainosus: Secondary | ICD-10-CM | POA: Diagnosis not present

## 2016-10-17 DIAGNOSIS — I10 Essential (primary) hypertension: Secondary | ICD-10-CM | POA: Diagnosis not present

## 2016-10-17 DIAGNOSIS — F411 Generalized anxiety disorder: Secondary | ICD-10-CM

## 2016-10-17 DIAGNOSIS — R635 Abnormal weight gain: Secondary | ICD-10-CM | POA: Diagnosis not present

## 2016-10-17 DIAGNOSIS — D72829 Elevated white blood cell count, unspecified: Secondary | ICD-10-CM

## 2016-10-17 DIAGNOSIS — R7303 Prediabetes: Secondary | ICD-10-CM

## 2016-10-17 DIAGNOSIS — Z87442 Personal history of urinary calculi: Secondary | ICD-10-CM | POA: Diagnosis not present

## 2016-10-17 DIAGNOSIS — Q2381 Bicuspid aortic valve: Secondary | ICD-10-CM

## 2016-10-17 DIAGNOSIS — M1990 Unspecified osteoarthritis, unspecified site: Secondary | ICD-10-CM | POA: Diagnosis not present

## 2016-10-17 DIAGNOSIS — K219 Gastro-esophageal reflux disease without esophagitis: Secondary | ICD-10-CM | POA: Diagnosis not present

## 2016-10-17 DIAGNOSIS — R739 Hyperglycemia, unspecified: Secondary | ICD-10-CM

## 2016-10-17 DIAGNOSIS — K589 Irritable bowel syndrome without diarrhea: Secondary | ICD-10-CM | POA: Diagnosis not present

## 2016-10-17 DIAGNOSIS — M2550 Pain in unspecified joint: Secondary | ICD-10-CM

## 2016-10-17 DIAGNOSIS — Q231 Congenital insufficiency of aortic valve: Secondary | ICD-10-CM | POA: Diagnosis not present

## 2016-10-17 DIAGNOSIS — Z3040 Encounter for surveillance of contraceptives, unspecified: Secondary | ICD-10-CM

## 2016-10-17 DIAGNOSIS — Z803 Family history of malignant neoplasm of breast: Secondary | ICD-10-CM

## 2016-10-17 DIAGNOSIS — Z3041 Encounter for surveillance of contraceptive pills: Secondary | ICD-10-CM

## 2016-10-17 MED ORDER — ESCITALOPRAM OXALATE 10 MG PO TABS
ORAL_TABLET | ORAL | 0 refills | Status: DC
Start: 2016-10-17 — End: 2016-11-21

## 2016-10-17 MED ORDER — NORGESTIMATE-ETH ESTRADIOL 0.25-35 MG-MCG PO TABS: 1.0000 | ORAL_TABLET | Freq: Every evening | ORAL | 11 refills | Status: DC

## 2016-10-17 MED ORDER — FUROSEMIDE 40 MG PO TABS: 40.0000 mg | ORAL_TABLET | Freq: Every morning | ORAL | 4 refills | Status: DC

## 2016-10-17 MED ORDER — OMEPRAZOLE 20 MG PO CPDR: DELAYED_RELEASE_CAPSULE | ORAL | 5 refills | Status: DC

## 2016-10-17 MED ORDER — SUMATRIPTAN SUCCINATE 50 MG PO TABS: 50.0000 mg | ORAL_TABLET | ORAL | 0 refills | Status: DC | PRN

## 2016-10-17 MED ORDER — TRAZODONE HCL 50 MG PO TABS: 50.0000 mg | ORAL_TABLET | Freq: Every day | ORAL | 5 refills | Status: DC

## 2016-10-17 MED ORDER — POTASSIUM CHLORIDE CRYS ER 10 MEQ PO TBCR: 10.0000 meq | EXTENDED_RELEASE_TABLET | ORAL | 5 refills | Status: DC

## 2016-10-17 MED ORDER — DICYCLOMINE HCL 20 MG PO TABS: ORAL_TABLET | ORAL | 5 refills | Status: DC

## 2016-10-17 MED ORDER — LOSARTAN POTASSIUM 100 MG PO TABS: 100.0000 mg | ORAL_TABLET | Freq: Every day | ORAL | 4 refills | Status: DC

## 2016-10-17 MED ORDER — CETIRIZINE HCL 10 MG PO TABS: 10.0000 mg | ORAL_TABLET | Freq: Every day | ORAL | 5 refills | Status: DC

## 2016-10-17 MED ORDER — MIRTAZAPINE 15 MG PO TABS: 15.0000 mg | ORAL_TABLET | Freq: Every day | ORAL | 5 refills | Status: DC

## 2016-10-17 NOTE — Progress Notes (Signed)
Pre visit review using our clinic review tool, if applicable. No additional management support is needed unless otherwise documented below in the visit note. 

## 2016-10-17 NOTE — Progress Notes (Signed)
Subjective:    Patient ID: Stephanie Miles, female    DOB: [birth year], 36 y.o.   MRN: [id number]  HPI  Stephanie Miles is a 36 yr old female who presents today to establish care.  Pmhx is significant for the following:  OCD/generalized anxiety disorder/ptsd- reports that she recently completed 2.5 years of therapy at [facility]. Reports that she was unemployed for some time and ran out of medications. Has not taken since November.  Ran out of remeron yesterday.  Uses remeron as needed for sleep as well as trazodone.  Takes each 4-5 days a week.  Did not feel that the effexor was effective throughout the day. She has tried prozac reports that it made her "fuzzy headed."  Zoloft did as well.  Felt too slow on the   Migraine- reports that she has about 1 migraine every 6 weeks.  Uses zofran prn as she often has nausea with her migraines.  Typically lasts 1-2 hours.    IBS- she follows with Dr. Lucio Edward on a prn basis.    HTN- off bp meds for a while. Reports no significant palpitations recently.  BP Readings from Last 3 Encounters:  10/17/16 130/87  09/26/16 153/100  03/30/16 140/80   GERD- stable on current dose of prilosec. She reports that she was misdiagnosed with asthma. No wheezing since she started prilosec.   Aortic regurg due to bicuspid aortic valve- she uses lasix once daily.  She reports that she had hx of elevated K and her kdur was d/c'd.    Hx of kidney stones- last one was was several weeks ago. Passed a stone on 3/5. Was seen in ED on 09/25/16.    OA- bilateral knee/back/hip pain. Uses meloxicam. Reports this helps her significantly. Reports that she does have chronic joint pain however and wonders if she has some autoimmune disease going on.     Review of Systems  Constitutional: Negative for unexpected weight change.  HENT: Positive for tinnitus. Negative for hearing loss and rhinorrhea.   Eyes: Negative for visual disturbance.  Respiratory: Negative for cough.     Cardiovascular: Positive for chest pain.  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  Musculoskeletal: Positive for arthralgias.  Skin: Negative for rash.  Neurological: Positive for headaches.  Hematological: Negative for adenopathy.  Psychiatric/Behavioral:       Denies current depression   Past Medical History:  Diagnosis Date  . Allergy   . Anxiety   . Aortic regurgitation due to bicuspid aortic valve   . Asthma   . Chronic kidney disease    kidney stones  . Depression   . GERD (gastroesophageal reflux disease)   . Heart murmur   . Hypertension   . IBS (irritable bowel syndrome)   . Insomnia   . Migraine headache   . OCD (obsessive compulsive disorder)   . S/P appy 05/2013     Social History   Social History  . Marital status: Single    Spouse name: N/A  . Number of children: 0  . Years of education: N/A   Occupational History  . Student    Social History Main Topics  . Smoking status: Former Smoker    Packs/day: 1.00    Years: 20.00    Types: Cigarettes    Quit date: 02/22/2013  . Smokeless tobacco: Never Used  . Alcohol use 0.0 oz/week     Comment: social  .  Drug use: No  . Sexual activity: Not on file   Other Topics Concern  . 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 DENTAL SURGERY     wisdom teeth  . LAPAROSCOPIC APPENDECTOMY N/A 06/06/2013   Procedure: APPENDECTOMY LAPAROSCOPIC;  Surgeon: Imogene Burn. Georgette Dover, MD;  Location: [facility];  Service: General;  Laterality: N/A;    Family History  Problem Relation Age of Onset  . Asthma Mother   . Hypertension Mother   . Breast cancer Father   . Diabetes Father   . Hypertension Father   . Heart disease Father   . Hyperlipidemia Father   . Asthma Sister   . Hyperlipidemia Brother   . Hypertension Brother   . Vision loss Brother   .  Colon cancer Maternal Grandmother   . Liver cancer Maternal Grandmother   . Asthma Sister   . Cervical cancer Maternal Aunt   . Melanoma Maternal Uncle   . Esophageal cancer Neg Hx   . Pancreatic cancer Neg Hx   . Rectal cancer Neg Hx   . Stomach cancer Neg Hx     Allergies  Allergen Reactions  . Codeine Nausea Only    Needs antiemetic    Current Outpatient Prescriptions on File Prior to Visit  Medication Sig Dispense Refill  . cholestyramine light 4 g POWD Take 1 packet (4 g total) by mouth 2 (two) times daily. 60 packet 1  . ibuprofen (ADVIL,MOTRIN) 800 MG tablet Take 1 tablet (800 mg total) by mouth 3 (three) times daily. 21 tablet 0  . meloxicam (MOBIC) 15 MG tablet Take 15 mg by mouth at bedtime.     No current facility-administered medications on file prior to visit.     BP 130/87 (BP Location: Right Arm, Patient Position: Sitting, Cuff Size: Large)   Pulse 98   Temp 98.8 F (37.1 C)   Resp 16   Ht 5' 6"  (1.676 m)   Wt 247 lb 6.4 oz (112.2 kg)   SpO2 98%   BMI 39.93 kg/m       Objective:   Physical Exam  Constitutional: She is oriented to person, place, and time. She appears well-developed and well-nourished.  HENT:  Head: Normocephalic and atraumatic.  Cardiovascular: Normal rate, regular rhythm and normal heart sounds.   No murmur heard. Pulmonary/Chest: Effort normal and breath sounds normal. No respiratory distress. She has no wheezes.  Musculoskeletal: She exhibits no edema.  Neurological: She is alert and oriented to person, place, and time.  Psychiatric: She has a normal mood and affect. Her behavior is normal. Judgment and thought content normal.          Assessment & Plan:

## 2016-10-17 NOTE — Patient Instructions (Addendum)
Complete lab work prior to leaving. You will be contacted about your referral to orthopedics. Begin lexapro 60m, 1/2 tab once daily for 1 week, then increase to a full tab once daily on week two. You may use imitrex as needed for migraine.  Restart lasix.  You will be contacted about scheduling your mammogram.  Welcome to [facility]

## 2016-10-18 LAB — CBC WITH DIFFERENTIAL/PLATELET
BASOS PCT: 1.2 % (ref 0.0–3.0)
Basophils Absolute: 0.2 10*3/uL — ABNORMAL HIGH (ref 0.0–0.1)
EOS PCT: 2.9 % (ref 0.0–5.0)
Eosinophils Absolute: 0.4 10*3/uL (ref 0.0–0.7)
HEMATOCRIT: 41.4 % (ref 36.0–46.0)
HEMOGLOBIN: 13.8 g/dL (ref 12.0–15.0)
LYMPHS PCT: 37.5 % (ref 12.0–46.0)
Lymphs Abs: 5 10*3/uL — ABNORMAL HIGH (ref 0.7–4.0)
MCHC: 33.4 g/dL (ref 30.0–36.0)
MCV: 84.5 fl (ref 78.0–100.0)
Monocytes Absolute: 0.9 10*3/uL (ref 0.1–1.0)
Monocytes Relative: 7.1 % (ref 3.0–12.0)
NEUTROS ABS: 6.8 10*3/uL (ref 1.4–7.7)
Neutrophils Relative %: 51.3 % (ref 43.0–77.0)
PLATELETS: 345 10*3/uL (ref 150.0–400.0)
RBC: 4.9 Mil/uL (ref 3.87–5.11)
RDW: 14.4 % (ref 11.5–15.5)
WBC: 13.3 10*3/uL — AB (ref 4.0–10.5)

## 2016-10-18 LAB — TSH: TSH: 3.07 u[IU]/mL (ref 0.35–4.50)

## 2016-10-18 LAB — HEMOGLOBIN A1C: Hgb A1c MFr Bld: 6.3 % (ref 4.6–6.5)

## 2016-10-18 LAB — PREGNANCY, URINE: PREG TEST UR: NEGATIVE

## 2016-10-18 LAB — SEDIMENTATION RATE: Sed Rate: 64 mm/hr — ABNORMAL HIGH (ref 0–20)

## 2016-10-19 LAB — ANTI-NUCLEAR AB-TITER (ANA TITER)

## 2016-10-19 LAB — ANA: ANA: POSITIVE — AB

## 2016-10-19 LAB — RHEUMATOID FACTOR

## 2016-10-20 ENCOUNTER — Telehealth: Payer: Self-pay | Admitting: Family

## 2016-10-20 ENCOUNTER — Encounter: Payer: Self-pay | Admitting: Family

## 2016-10-20 DIAGNOSIS — R768 Other specified abnormal immunological findings in serum: Secondary | ICD-10-CM

## 2016-10-20 DIAGNOSIS — E119 Type 2 diabetes mellitus without complications: Secondary | ICD-10-CM | POA: Insufficient documentation

## 2016-10-20 DIAGNOSIS — R7303 Prediabetes: Secondary | ICD-10-CM

## 2016-10-20 HISTORY — DX: Prediabetes: R73.03

## 2016-10-20 HISTORY — DX: Type 2 diabetes mellitus without complications: E11.9

## 2016-10-20 NOTE — Telephone Encounter (Signed)
Please let pt know that her blood work shows borderline diabetes.  I would recommend that she work on avoiding concentrated sweets, ensuring regular exercise, limiting carbohydrates and weight loss.  Some of her autoimmune testing is abnormal. I would like to refer her to a rheumatologist for further evaluation.  Thyroid testing is normal White blood cell testing is improved.

## 2016-10-21 DIAGNOSIS — M1990 Unspecified osteoarthritis, unspecified site: Secondary | ICD-10-CM

## 2016-10-21 DIAGNOSIS — Z87442 Personal history of urinary calculi: Secondary | ICD-10-CM | POA: Insufficient documentation

## 2016-10-21 HISTORY — DX: Personal history of urinary calculi: Z87.442

## 2016-10-21 HISTORY — DX: Unspecified osteoarthritis, unspecified site: M19.90

## 2016-10-21 NOTE — Assessment & Plan Note (Signed)
Stable, management per GI

## 2016-10-21 NOTE — Assessment & Plan Note (Signed)
Continue meloxicam.  Of note, ANA was positive and she has an elevated sed rate. Refer to rheumatology- see phone note.

## 2016-10-21 NOTE — Assessment & Plan Note (Signed)
Stable on PPI, continue same.

## 2016-10-21 NOTE — Assessment & Plan Note (Signed)
Uncontrolled.  Begin trial of lexapro.  Continue remeron/trazodone for sleep.

## 2016-10-21 NOTE — Assessment & Plan Note (Signed)
Stable off of meds, monitor.

## 2016-10-21 NOTE — Assessment & Plan Note (Signed)
Stable, continue prn imitrex/zofran.

## 2016-10-21 NOTE — Assessment & Plan Note (Signed)
Clinically resolved.

## 2016-10-21 NOTE — Assessment & Plan Note (Signed)
Appears clinically compensated. Continue lasix.

## 2016-10-21 NOTE — Assessment & Plan Note (Signed)
Lab Results  Component Value Date   HGBA1C 6.3 10/17/2016   Advised pt to work on diet/exercise/weight loss, see phone note.

## 2016-10-24 ENCOUNTER — Telehealth: Payer: Self-pay | Admitting: Family

## 2016-10-24 NOTE — Telephone Encounter (Signed)
Notified pt that someome at the rheumatology office should call her to set up an appointment in about a week and if she doesn't hear from them in a week to give [place] a call back.

## 2016-10-24 NOTE — Telephone Encounter (Signed)
Tried to reach pt left message to call back.

## 2016-10-24 NOTE — Telephone Encounter (Signed)
Spoke with pt about results. Pt voiced understanding. Pt stated she if okay with being referred.

## 2016-10-24 NOTE — Telephone Encounter (Signed)
°  Relation to XU:[id number] Call back number:[phone] Pharmacy:  Reason for call:  Patient inquiring about rheumatologist referral, please advise

## 2016-10-25 ENCOUNTER — Encounter: Payer: Self-pay | Admitting: Family

## 2016-10-25 NOTE — Telephone Encounter (Signed)
Stephanie Miles-- pt's rheumatology consult isn't until July and she wants to try an get in with someone sooner. Can you let pt know what you find out?

## 2016-10-25 NOTE — Telephone Encounter (Signed)
Stephanie Miles-- there was a rheumatology referral pended in a previous phone note but it has now been signed. Thanks!

## 2016-10-25 NOTE — Telephone Encounter (Signed)
Stephanie Miles - there is no rheumatology referral in the system. We referred pt to [facility]. Is that the appt that is 1 month out or did pt call on her own to a rheumatologist? There are notes of a referral to rheumatology but no actual referral.

## 2016-11-21 ENCOUNTER — Ambulatory Visit (INDEPENDENT_AMBULATORY_CARE_PROVIDER_SITE_OTHER): Payer: Managed Care, Other (non HMO) | Admitting: Family

## 2016-11-21 ENCOUNTER — Encounter: Payer: Self-pay | Admitting: Family

## 2016-11-21 ENCOUNTER — Other Ambulatory Visit (HOSPITAL_COMMUNITY)
Admission: RE | Admit: 2016-11-21 | Discharge: 2016-11-21 | Disposition: A | Discharge status: Home / Self Care | Payer: Managed Care, Other (non HMO) | Source: Ambulatory Visit | Attending: Family | Admitting: Family

## 2016-11-21 ENCOUNTER — Ambulatory Visit (HOSPITAL_BASED_OUTPATIENT_CLINIC_OR_DEPARTMENT_OTHER)
Admission: RE | Admit: 2016-11-21 | Discharge: 2016-11-21 | Disposition: A | Discharge status: Home / Self Care | Payer: Managed Care, Other (non HMO) | Source: Ambulatory Visit | Attending: Family | Admitting: Family

## 2016-11-21 ENCOUNTER — Encounter (HOSPITAL_BASED_OUTPATIENT_CLINIC_OR_DEPARTMENT_OTHER): Payer: Self-pay

## 2016-11-21 VITALS — BP 178/100 | HR 98 | Temp 98.4°F | Resp 16 | Ht 66.0 in | Wt 248.2 lb

## 2016-11-21 DIAGNOSIS — Z803 Family history of malignant neoplasm of breast: Secondary | ICD-10-CM | POA: Insufficient documentation

## 2016-11-21 DIAGNOSIS — F329 Major depressive disorder, single episode, unspecified: Secondary | ICD-10-CM | POA: Diagnosis not present

## 2016-11-21 DIAGNOSIS — Z1231 Encounter for screening mammogram for malignant neoplasm of breast: Secondary | ICD-10-CM | POA: Diagnosis present

## 2016-11-21 DIAGNOSIS — Z01419 Encounter for gynecological examination (general) (routine) without abnormal findings: Secondary | ICD-10-CM | POA: Diagnosis not present

## 2016-11-21 DIAGNOSIS — F32A Depression, unspecified: Secondary | ICD-10-CM

## 2016-11-21 DIAGNOSIS — R928 Other abnormal and inconclusive findings on diagnostic imaging of breast: Secondary | ICD-10-CM | POA: Diagnosis not present

## 2016-11-21 DIAGNOSIS — Z0000 Encounter for general adult medical examination without abnormal findings: Secondary | ICD-10-CM | POA: Diagnosis not present

## 2016-11-21 DIAGNOSIS — L304 Erythema intertrigo: Secondary | ICD-10-CM

## 2016-11-21 DIAGNOSIS — K649 Unspecified hemorrhoids: Secondary | ICD-10-CM | POA: Diagnosis not present

## 2016-11-21 DIAGNOSIS — G4733 Obstructive sleep apnea (adult) (pediatric): Secondary | ICD-10-CM

## 2016-11-21 DIAGNOSIS — I10 Essential (primary) hypertension: Secondary | ICD-10-CM | POA: Diagnosis not present

## 2016-11-21 MED ORDER — AMLODIPINE BESYLATE 5 MG PO TABS: 5.0000 mg | ORAL_TABLET | Freq: Every day | ORAL | 3 refills | Status: DC

## 2016-11-21 MED ORDER — ESCITALOPRAM OXALATE 10 MG PO TABS: 10.0000 mg | ORAL_TABLET | Freq: Every day | ORAL | 1 refills | Status: DC

## 2016-11-21 MED ORDER — CLOTRIMAZOLE-BETAMETHASONE 1-0.05 % EX CREA: 1.0000 "application " | TOPICAL_CREAM | Freq: Two times a day (BID) | CUTANEOUS | 1 refills | Status: DC

## 2016-11-21 NOTE — Progress Notes (Signed)
Pre visit review using our clinic review tool, if applicable. No additional management support is needed unless otherwise documented below in the visit note. 

## 2016-11-21 NOTE — Patient Instructions (Addendum)
Please complete lab work prior to leaving. Work on [employer], weight loss.  Add amlodipine for blood pressure once daily.

## 2016-11-21 NOTE — Progress Notes (Signed)
Subjective:    Patient ID: Stephanie Miles, female    DOB: Aug 15, 1980, 36 y.o.   MRN: [id number]  HPI  Stephanie Miles is a 36 yr old female who presents today for cpx.  Patient presents today for complete physical.  Immunizations: Tdap 11/20/12 Diet: healthy Exercise: limited by knee pain Pap Smear: 4 yrs ago. Remote hx of abnormal pap Mammogram: done earlier today  Depression- was started on lexapro last visit for uncontrolled depression.  Swas continued on remeron/trazodone.  Felt a little foggy at first. Did not go up to the full tab until 2 weeks.    Wt Readings from Last 3 Encounters:  11/21/16 248 lb 3.2 oz (112.6 kg)  10/17/16 247 lb 6.4 oz (112.2 kg)  09/25/16 250 lb (113.4 kg)    HTN- currently maintained on losartan, furosemid.  BP Readings from Last 3 Encounters:  11/21/16 (!) 144/104  10/17/16 130/87  09/26/16 153/100   OSA- + fatigue, using an old machine, not sure settings are correct.   Review of Systems  Constitutional: Negative for unexpected weight change.       + fatigue, reports that she sleeps 9 hours a night  HENT: Positive for rhinorrhea.   Eyes: Negative for visual disturbance.  Respiratory: Negative for cough.   Cardiovascular:       Notes mild chronic bilateral LE edema  Gastrointestinal: Positive for diarrhea. Negative for constipation.       Reports one episode of blood in stool, bright on tissue. Happens about 1x a month. Has hx of small internal hemorrhoids had colo 2015 WNL  Genitourinary: Negative for dysuria and frequency.  Musculoskeletal: Positive for arthralgias.       Knee pain and lower back pain  Skin:       Notes occasional ?butterfly rash  Neurological:       HA's are better since she got her bifocals.   Psychiatric/Behavioral:       Depression improving   Past Medical History:  Diagnosis Date  . Allergy   . Anxiety   . Aortic regurgitation due to bicuspid aortic valve   . Asthma   . Borderline diabetes 10/20/2016  .  Chronic kidney disease    kidney stones  . Depression   . GERD (gastroesophageal reflux disease)   . Heart murmur   . Hypertension   . IBS (irritable bowel syndrome)   . Insomnia   . Migraine headache   . OCD (obsessive compulsive disorder)   . S/P appy 05/2013     Social History   Social History  . Marital status: Single    Spouse name: N/A  . Number of children: 0  . Years of education: N/A   Occupational History  . Student    Social History Main Topics  . Smoking status: Former Smoker    Packs/day: 1.00    Years: 20.00    Types: Cigarettes    Quit date: 02/22/2013  . Smokeless tobacco: Never Used  . Alcohol use 0.0 oz/week     Comment: social  . Drug use: No  . Sexual activity: Not on file   Other Topics Concern  . Not on file   Social History Narrative   Working at 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   Friends and family in [place]    Past Surgical History:  Procedure Laterality Date  . DENTAL SURGERY     wisdom teeth  . LAPAROSCOPIC APPENDECTOMY N/A 06/06/2013   Procedure: APPENDECTOMY LAPAROSCOPIC;  Surgeon: Imogene Burn. Georgette Dover, MD;  Location: [facility];  Service: General;  Laterality: N/A;    Family History  Problem Relation Age of Onset  . Asthma Mother   . Hypertension Mother   . Breast cancer Father   . Diabetes Father   . Hypertension Father   . Heart disease Father   . Hyperlipidemia Father   . Asthma Sister   . Hyperlipidemia Brother   . Hypertension Brother   . Vision loss Brother   . Colon cancer Maternal Grandmother   . Liver cancer Maternal Grandmother   . Asthma Sister   . Cervical cancer Maternal Aunt   . Melanoma Maternal Uncle   . Esophageal cancer Neg Hx   . Pancreatic cancer Neg Hx   . Rectal cancer Neg Hx   . Stomach cancer Neg Hx     Allergies  Allergen Reactions  . Codeine Nausea Only    Needs  antiemetic    Current Outpatient Prescriptions on File Prior to Visit  Medication Sig Dispense Refill  . cetirizine (ZYRTEC) 10 MG tablet Take 1 tablet (10 mg total) by mouth daily. 30 tablet 5  . dicyclomine (BENTYL) 20 MG tablet TAKE 1 TABLET BY MOUTH 4 TIMES DAILY BEFORE MEALS AND AT BEDTIME 120 tablet 5  . furosemide (LASIX) 40 MG tablet Take 1 tablet (40 mg total) by mouth every morning. 30 tablet 4  . losartan (COZAAR) 100 MG tablet Take 1 tablet (100 mg total) by mouth daily. 30 tablet 4  . meloxicam (MOBIC) 15 MG tablet Take 15 mg by mouth at bedtime.    . mirtazapine (REMERON) 15 MG tablet Take 1 tablet (15 mg total) by mouth at bedtime. 30 tablet 5  . norgestimate-ethinyl estradiol (ORTHO-CYCLEN,SPRINTEC,PREVIFEM) 0.25-35 MG-MCG tablet Take 1 tablet by mouth every evening. 1 Package 11  . omeprazole (PRILOSEC) 20 MG capsule Take 38m before breakfast and 268mbefore supper. 90 capsule 5  . traZODone (DESYREL) 50 MG tablet Take 1-2 tablets (50-100 mg total) by mouth at bedtime. 60 tablet 5  . potassium chloride (K-DUR,KLOR-CON) 10 MEQ tablet Take 1 tablet (10 mEq total) by mouth every other day. (Patient not taking: Reported on 11/21/2016) 15 tablet 5  . SUMAtriptan (IMITREX) 50 MG tablet Take 1 tablet (50 mg total) by mouth every 2 (two) hours as needed for migraine. May repeat in 2 hours if headache persists or recurs. (Patient not taking: Reported on 11/21/2016) 10 tablet 0   No current facility-administered medications on file prior to visit.     BP (!) 144/104 (BP Location: Right Arm, Cuff Size: Large)   Pulse 98   Temp 98.4 F (36.9 C) (Oral)   Resp 16   Ht 5' 6"  (1.676 m)   Wt 248 lb 3.2 oz (112.6 kg)   SpO2 98% Comment: room air  BMI 40.06 kg/m       Objective:   Physical Exam Physical Exam  Constitutional: She is oriented to person, place, and time. She appears well-developed and well-nourished. No distress.  HENT:  Head: Normocephalic and atraumatic.  Right  Ear: Tympanic membrane and ear canal normal.  Left Ear: Tympanic membrane and ear canal normal.  Mouth/Throat: Oropharynx is clear and moist.  Eyes: Pupils are equal, round, and reactive to light. No scleral icterus.  Neck: Normal range of motion. No thyromegaly present.  Cardiovascular: Normal rate and regular rhythm.   No murmur heard. Pulmonary/Chest: Effort normal and breath sounds normal. No respiratory distress. He has no wheezes. She has no rales. She exhibits no tenderness.  Abdominal: Soft. Bowel sounds are normal. She exhibits no distension and no mass. There is no tenderness. There is no rebound and no guarding.  Musculoskeletal: She exhibits no edema.  Lymphadenopathy:    She has no cervical adenopathy.  Neurological: She is alert and oriented to person, place, and time. She has normal patellar reflexes. She exhibits normal muscle tone. Coordination normal.  Skin: Skin is warm and dry. rash noted beneath breasts Psychiatric: She has a normal mood and affect. Her behavior is normal. Judgment and thought content normal.  Breasts: Examined lying Right: Without masses, retractions, discharge or axillary adenopathy.  Left: Without masses, retractions, discharge or axillary adenopathy.  Inguinal/mons: Normal without inguinal adenopathy  External genitalia: Normal  BUS/Urethra/Skene's glands: Normal  Bladder: Normal  Vagina: Normal  Cervix: Normal (difficult pap due to discomfort and cervical position)  Uterus: normal in size, shape and contour. Midline and mobile  Adnexa/parametria:  Rt: Without masses or tenderness.  Lt: Without masses or tenderness.  Anus and perineum: Normal            Assessment & Plan:   Preventative care- discussed healthy diet, exercise, weight loss.  Obtain routine lab work.  Pap performed.   Intertrigo- reports no improvement in the past with antifungal powder, had better result with combo cream (steroid/powder) Rx sent for lamisil.   HTN-  uncontrolled. Add amlodipine.   Hx of hemorrhoids- reports intermittent rectal bleeding which she attributes to hemorroids. Will order IFOB. If + refer back to GI. She knows to got to the ER if severe rectal bleed.   Depression- improved, continue current meds.   OSA- refer to pulmonology for further evaluation. Not optimally controlled.  Needs new machine and possibly new sleep study.      Assessment & Plan:

## 2016-11-22 ENCOUNTER — Other Ambulatory Visit: Payer: Self-pay | Admitting: Family

## 2016-11-22 ENCOUNTER — Telehealth: Payer: Self-pay | Admitting: Family

## 2016-11-22 DIAGNOSIS — R928 Other abnormal and inconclusive findings on diagnostic imaging of breast: Secondary | ICD-10-CM

## 2016-11-22 DIAGNOSIS — K625 Hemorrhage of anus and rectum: Secondary | ICD-10-CM

## 2016-11-22 LAB — LDL CHOLESTEROL, DIRECT: LDL DIRECT: 126 mg/dL

## 2016-11-22 LAB — URINALYSIS, ROUTINE W REFLEX MICROSCOPIC
BILIRUBIN URINE: NEGATIVE
Hgb urine dipstick: NEGATIVE
Ketones, ur: NEGATIVE
Leukocytes, UA: NEGATIVE
Nitrite: NEGATIVE
PH: 6 (ref 5.0–8.0)
RBC / HPF: NONE SEEN (ref 0–?)
TOTAL PROTEIN, URINE-UPE24: NEGATIVE
UROBILINOGEN UA: 0.2 (ref 0.0–1.0)
Urine Glucose: NEGATIVE
WBC UA: NONE SEEN (ref 0–?)

## 2016-11-22 LAB — LIPID PANEL
CHOL/HDL RATIO: 5
CHOLESTEROL: 197 mg/dL (ref 0–200)
HDL: 37.9 mg/dL — AB (ref >39.00)
NonHDL: 159.34
Triglycerides: 316 mg/dL — ABNORMAL HIGH (ref 0.0–149.0)
VLDL: 63.2 mg/dL — AB (ref 0.0–40.0)

## 2016-11-22 LAB — HEPATIC FUNCTION PANEL
ALBUMIN: 3.9 g/dL (ref 3.5–5.2)
ALT: 60 U/L — ABNORMAL HIGH (ref 0–35)
AST: 39 U/L — AB (ref 0–37)
Alkaline Phosphatase: 82 U/L (ref 39–117)
BILIRUBIN TOTAL: 0.2 mg/dL (ref 0.2–1.2)
Bilirubin, Direct: 0.1 mg/dL (ref 0.0–0.3)
Total Protein: 7.3 g/dL (ref 6.0–8.3)

## 2016-11-22 LAB — CBC WITH DIFFERENTIAL/PLATELET
BASOS ABS: 0.2 10*3/uL — AB (ref 0.0–0.1)
Basophils Relative: 1.3 % (ref 0.0–3.0)
EOS PCT: 2.6 % (ref 0.0–5.0)
Eosinophils Absolute: 0.4 10*3/uL (ref 0.0–0.7)
HCT: 39.8 % (ref 36.0–46.0)
Hemoglobin: 13.3 g/dL (ref 12.0–15.0)
LYMPHS ABS: 4.7 10*3/uL — AB (ref 0.7–4.0)
Lymphocytes Relative: 31.5 % (ref 12.0–46.0)
MCHC: 33.3 g/dL (ref 30.0–36.0)
MCV: 85.2 fl (ref 78.0–100.0)
MONO ABS: 1 10*3/uL (ref 0.1–1.0)
Monocytes Relative: 7 % (ref 3.0–12.0)
NEUTROS ABS: 8.5 10*3/uL — AB (ref 1.4–7.7)
NEUTROS PCT: 57.6 % (ref 43.0–77.0)
PLATELETS: 314 10*3/uL (ref 150.0–400.0)
RBC: 4.67 Mil/uL (ref 3.87–5.11)
RDW: 14.1 % (ref 11.5–15.5)
WBC: 14.8 10*3/uL — ABNORMAL HIGH (ref 4.0–10.5)

## 2016-11-22 NOTE — Telephone Encounter (Signed)
Attempted to notify pt and left message to check mychart acct.  Message sent. IFOB placed at front desk for pick up.

## 2016-11-22 NOTE — Telephone Encounter (Signed)
I meant to have pt complete IFOB due to rectal bleeding please.

## 2016-11-23 ENCOUNTER — Telehealth: Payer: Self-pay | Admitting: *Deleted

## 2016-11-23 LAB — CYTOLOGY - PAP
DIAGNOSIS: NEGATIVE
HPV: NOT DETECTED

## 2016-11-23 NOTE — Telephone Encounter (Signed)
Received Physician Order from [place], forwarded to provider/SLS 05/01

## 2016-11-24 ENCOUNTER — Telehealth: Payer: Self-pay | Admitting: Family

## 2016-11-24 ENCOUNTER — Telehealth: Payer: Self-pay | Admitting: *Deleted

## 2016-11-24 DIAGNOSIS — D72829 Elevated white blood cell count, unspecified: Secondary | ICD-10-CM

## 2016-11-24 DIAGNOSIS — R7989 Other specified abnormal findings of blood chemistry: Secondary | ICD-10-CM

## 2016-11-24 DIAGNOSIS — R945 Abnormal results of liver function studies: Secondary | ICD-10-CM

## 2016-11-24 NOTE — Telephone Encounter (Signed)
Received Physician Order from [place], forwarded to provider/SLS 05/03

## 2016-11-24 NOTE — Telephone Encounter (Signed)
See mychart.  

## 2016-11-25 ENCOUNTER — Other Ambulatory Visit: Payer: Self-pay | Admitting: Family

## 2016-11-25 ENCOUNTER — Telehealth: Payer: Self-pay | Admitting: Family

## 2016-11-25 ENCOUNTER — Ambulatory Visit
Admission: RE | Admit: 2016-11-25 | Discharge: 2016-11-25 | Disposition: A | Discharge status: Home / Self Care | Payer: Managed Care, Other (non HMO) | Source: Ambulatory Visit | Attending: Family | Admitting: Family

## 2016-11-25 ENCOUNTER — Ambulatory Visit (HOSPITAL_BASED_OUTPATIENT_CLINIC_OR_DEPARTMENT_OTHER)
Admission: RE | Admit: 2016-11-25 | Discharge: 2016-11-25 | Disposition: A | Discharge status: Home / Self Care | Payer: Managed Care, Other (non HMO) | Source: Ambulatory Visit | Attending: Family | Admitting: Family

## 2016-11-25 ENCOUNTER — Other Ambulatory Visit (INDEPENDENT_AMBULATORY_CARE_PROVIDER_SITE_OTHER): Payer: Managed Care, Other (non HMO)

## 2016-11-25 DIAGNOSIS — R945 Abnormal results of liver function studies: Secondary | ICD-10-CM | POA: Diagnosis present

## 2016-11-25 DIAGNOSIS — N6320 Unspecified lump in the left breast, unspecified quadrant: Secondary | ICD-10-CM

## 2016-11-25 DIAGNOSIS — K769 Liver disease, unspecified: Secondary | ICD-10-CM | POA: Insufficient documentation

## 2016-11-25 DIAGNOSIS — R928 Other abnormal and inconclusive findings on diagnostic imaging of breast: Secondary | ICD-10-CM

## 2016-11-25 DIAGNOSIS — R7989 Other specified abnormal findings of blood chemistry: Secondary | ICD-10-CM

## 2016-11-25 LAB — FERRITIN: Ferritin: 91.3 ng/mL (ref 10.0–291.0)

## 2016-11-25 NOTE — Telephone Encounter (Signed)
Pt came for labs today. Pt wants it documented that she went for mammogram 4/30 and had 3D mammogram and [place] today and is scheduled for breast bx this afternoon for mass found in breast. Also pt states she will schedule the US abdomen [place] ordered.

## 2016-11-26 LAB — HEPATITIS C ANTIBODY: HCV Ab: NEGATIVE

## 2016-11-26 LAB — HEPATITIS PANEL, ACUTE
HCV AB: NEGATIVE
HEP A IGM: NONREACTIVE
HEP B C IGM: NONREACTIVE
HEP B S AG: NEGATIVE

## 2016-11-28 ENCOUNTER — Encounter: Payer: Self-pay | Admitting: Family

## 2016-11-28 ENCOUNTER — Telehealth: Payer: Self-pay | Admitting: Family

## 2016-11-28 DIAGNOSIS — K760 Fatty (change of) liver, not elsewhere classified: Secondary | ICD-10-CM

## 2016-11-28 HISTORY — DX: Fatty (change of) liver, not elsewhere classified: K76.0

## 2016-12-06 ENCOUNTER — Other Ambulatory Visit: Payer: Self-pay | Admitting: Family

## 2016-12-06 ENCOUNTER — Ambulatory Visit (HOSPITAL_BASED_OUTPATIENT_CLINIC_OR_DEPARTMENT_OTHER): Payer: Managed Care, Other (non HMO) | Admitting: Family

## 2016-12-06 ENCOUNTER — Other Ambulatory Visit (HOSPITAL_BASED_OUTPATIENT_CLINIC_OR_DEPARTMENT_OTHER): Payer: Managed Care, Other (non HMO)

## 2016-12-06 ENCOUNTER — Ambulatory Visit: Payer: Managed Care, Other (non HMO)

## 2016-12-06 VITALS — BP 148/88 | HR 85 | Temp 99.4°F | Resp 18 | Ht 66.0 in | Wt 256.8 lb

## 2016-12-06 DIAGNOSIS — D72829 Elevated white blood cell count, unspecified: Secondary | ICD-10-CM

## 2016-12-06 DIAGNOSIS — D508 Other iron deficiency anemias: Secondary | ICD-10-CM

## 2016-12-06 HISTORY — DX: Elevated white blood cell count, unspecified: D72.829

## 2016-12-06 LAB — CBC WITH DIFFERENTIAL (CANCER CENTER ONLY)
BASO#: 0.1 10*3/uL (ref 0.0–0.2)
BASO%: 0.5 % (ref 0.0–2.0)
EOS%: 2.7 % (ref 0.0–7.0)
Eosinophils Absolute: 0.3 10*3/uL (ref 0.0–0.5)
HCT: 37.7 % (ref 34.8–46.6)
HGB: 12.6 g/dL (ref 11.6–15.9)
LYMPH#: 4.4 10*3/uL — ABNORMAL HIGH (ref 0.9–3.3)
LYMPH%: 36.8 % (ref 14.0–48.0)
MCH: 28.8 pg (ref 26.0–34.0)
MCHC: 33.4 g/dL (ref 32.0–36.0)
MCV: 86 fL (ref 81–101)
MONO#: 0.9 10*3/uL (ref 0.1–0.9)
MONO%: 7.4 % (ref 0.0–13.0)
NEUT#: 6.3 10*3/uL (ref 1.5–6.5)
NEUT%: 52.6 % (ref 39.6–80.0)
Platelets: 281 10*3/uL (ref 145–400)
RBC: 4.38 10*6/uL (ref 3.70–5.32)
RDW: 13.8 % (ref 11.1–15.7)
WBC: 12 10*3/uL — ABNORMAL HIGH (ref 3.9–10.0)

## 2016-12-06 LAB — COMPREHENSIVE METABOLIC PANEL (CC13)
A/G RATIO: 1.3 (ref 1.2–2.2)
ALBUMIN: 3.9 g/dL (ref 3.5–5.5)
ALK PHOS: 94 IU/L (ref 39–117)
ALT: 98 IU/L — ABNORMAL HIGH (ref 0–32)
AST (SGOT): 68 IU/L — ABNORMAL HIGH (ref 0–40)
BILIRUBIN TOTAL: 0.2 mg/dL (ref 0.0–1.2)
BUN / CREAT RATIO: 13 (ref 9–23)
BUN: 10 mg/dL (ref 6–20)
Calcium, Ser: 9.3 mg/dL (ref 8.7–10.2)
Carbon Dioxide, Total: 25 mmol/L (ref 18–29)
Chloride, Ser: 105 mmol/L (ref 96–106)
Creatinine, Ser: 0.75 mg/dL (ref 0.57–1.00)
GFR, EST AFRICAN AMERICAN: 118 mL/min/{1.73_m2} (ref >59)
GFR, EST NON AFRICAN AMERICAN: 102 mL/min/{1.73_m2} (ref >59)
GLUCOSE: 90 mg/dL (ref 65–99)
Globulin, Total: 3.1 g/dL (ref 1.5–4.5)
Potassium, Ser: 4.1 mmol/L (ref 3.5–5.2)
Sodium: 137 mmol/L (ref 134–144)
TOTAL PROTEIN: 7 g/dL (ref 6.0–8.5)

## 2016-12-06 LAB — CHCC SATELLITE - SMEAR

## 2016-12-06 NOTE — Progress Notes (Signed)
Hematology/Oncology Consultation   Name: Stephanie Miles      MRN: [id number]    Location: Room/bed info not found  Date: 12/06/2016 Time:4:09 PM   REFERRING PHYSICIAN: Debbrah Alar, NP  REASON FOR CONSULT: Leukocytosis   DIAGNOSIS: Leukocytosis - likely reactive   HISTORY OF PRESENT ILLNESS: [name] is a very pleasant 36 yo caucasian female with history of leukocytosis dating back as far as December 2014. She has arthritic pains in her joints and specifically in her knees. She had a positive ANA and elevated sed rate on recent lab work.  She has an appointment with Rheumatology on June 6th for further evaluation. She states that she has been suffering from arthritis for at least 4 years. She states that her knees will give out on her and she has had several falls. Thankfully she has not been injured. She has ordered knee braces to help with support.   She is feeling very fatigued. She also states that she has a low grade fever of 99.3 off and on.  She has also noticed that her hair is falling out and she has gained weight (10-15 lbs in the last 6 months) despite not having an appetite. She is staying hydrated.  No issue with frequent infections. No chills, n/v, cough, rash, dizziness, SOB, chest pain, palpitations, abdominal pain or changes in bowel or bladder habits.  She has severe IBS and has diarrhea at least 5 times a day. She takes Bentyl daily.  She had her colonoscopy in April 2015 with Dr. Fuller Plan which showed some small internal hemorrhoids.  She has issues with kidney stones and states that she has them once or twice a year.  She is on an oral contraceptive and skips the placebo week so she does not have a period. When she did have a cycle it was heavy  With "terrible cramps" and headaches.  She has no children but has history of one miscarriage 11 years ago in the first few weeks. She was on birth control at the time.  No family histroy of leukocytosis that she knows of. She  has an impressive family cancer history including: Father - breast cancer, paternal grandmother - breast cancer, paternal great aunt - liver cancer, maternal grandmother lung cancer, maternal grandfather - melanoma and maternal aunt with recurrent uterine cancer.  She states that she does not know if her father had genetic testing with his breast cancer. We suggested that she see a geneticist and be checked for the BRCA2 gene.  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 She states that with both her and her families history she will now be having mammograms every 6 months.  She is not a smoker and has a glass of wine once or twice weekly with meals.  She does take Omeprazole  60 mg PO daily for GERD.  Her LFT's remain elevated and recent US showed hepatic steatosis. TSH in March was stable.  She stays active walking one mile a day.  She flew back from visiting family in [place] yesterday and still has some slight puffiness in her ankles that is slowly improving. No numbness or tingling in her extremities at this time.   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 She loves her job and enjoys going to work.   ROS: All other 10 point review of systems is negative.   PAST MEDICAL HISTORY:  Past Medical History:  Diagnosis Date  . Allergy   . Anxiety   . Aortic regurgitation due to bicuspid aortic valve   . Asthma   . Borderline diabetes 10/20/2016  . Chronic kidney disease    kidney stones  . Depression   . Fatty liver 11/28/2016  . GERD (gastroesophageal reflux disease)   . Heart murmur   . Hypertension   . IBS (irritable bowel syndrome)   . Insomnia   . Migraine headache   . OCD (obsessive compulsive disorder)   . 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 S/P appy 05/2013    ALLERGIES: Allergies  Allergen Reactions  . Codeine Nausea  Only    Needs antiemetic      MEDICATIONS:  Current Outpatient Prescriptions on File Prior to Visit  Medication Sig Dispense Refill  . amLODipine (NORVASC) 5 MG tablet Take 1 tablet (5 mg total) by mouth daily. 30 tablet 3  . cetirizine (ZYRTEC) 10 MG tablet Take 1 tablet (10 mg total) by mouth daily. 30 tablet 5  . clotrimazole-betamethasone (LOTRISONE) cream Apply 1 application topically 2 (two) times daily. 30 g 1  . dicyclomine (BENTYL) 20 MG tablet TAKE 1 TABLET BY MOUTH 4 TIMES DAILY BEFORE MEALS AND AT BEDTIME 120 tablet 5  . escitalopram (LEXAPRO) 10 MG tablet Take 1 tablet (10 mg total) by mouth daily. 90 tablet 1  . furosemide (LASIX) 40 MG tablet Take 1 tablet (40 mg total) by mouth every morning. 30 tablet 4  . losartan (COZAAR) 100 MG tablet Take 1 tablet (100 mg total) by mouth daily. 30 tablet 4  . meloxicam (MOBIC) 15 MG tablet Take 15 mg by mouth at bedtime.    . mirtazapine (REMERON) 15 MG tablet Take 1 tablet (15 mg total) by mouth at bedtime. 30 tablet 5  . norgestimate-ethinyl estradiol (ORTHO-CYCLEN,SPRINTEC,PREVIFEM) 0.25-35 MG-MCG tablet Take 1 tablet by mouth every evening. 1 Package 11  . omeprazole (PRILOSEC) 20 MG capsule Take 40mg before breakfast and 20mg before supper. 90 capsule 5  . potassium chloride (K-DUR,KLOR-CON) 10 MEQ tablet Take 1 tablet (10 mEq total) by mouth every other day. (Patient not taking: Reported on 11/21/2016) 15 tablet 5  . SUMAtriptan (IMITREX) 50 MG tablet Take 1 tablet (50 mg total) by mouth every 2 (two) hours as needed for migraine. May repeat in 2 hours if headache persists or recurs. (Patient not taking: Reported on 11/21/2016) 10 tablet 0  . traZODone (DESYREL) 50 MG tablet Take 1-2 tablets (50-100 mg total) by mouth at bedtime. 60 tablet 5   No current facility-administered medications on file prior to visit.      PAST SURGICAL HISTORY Past Surgical History:  Procedure Laterality Date  . DENTAL SURGERY     wisdom teeth  .  LAPAROSCOPIC APPENDECTOMY N/A 06/06/2013   Procedure: APPENDECTOMY LAPAROSCOPIC;  Surgeon: Matthew K. Tsuei, MD;  Location: [facility] OR;  Service: General;  Laterality: N/A;    FAMILY HISTORY: Family History  Problem Relation Age of Onset  . Asthma Mother   . Hypertension Mother   . Breast cancer Father   . Diabetes Father   . Hypertension Father   . Heart disease Father   . Hyperlipidemia Father   . Asthma Sister   . Hyperlipidemia Brother   . Hypertension Brother   . Vision loss Brother   . Colon cancer Maternal Grandmother   . Liver cancer Maternal Grandmother   .   Asthma Sister   . Cervical cancer Maternal Aunt   . Melanoma Maternal Uncle   . Esophageal cancer Neg Hx   . Pancreatic cancer Neg Hx   . Rectal cancer Neg Hx   . Stomach cancer Neg Hx     SOCIAL HISTORY:  reports that she quit smoking about 3 years ago. Her smoking use included Cigarettes. She has a 20.00 pack-year smoking history. She has never used smokeless tobacco. She reports that she drinks alcohol. She reports that she does not use drugs.  PERFORMANCE STATUS: The patient's performance status is 1 - Symptomatic but completely ambulatory  PHYSICAL EXAM: Most Recent Vital Signs: Blood pressure (!) 148/88, pulse 85, temperature 99.4 F (37.4 C), temperature source Oral, resp. rate 18, height 5' 6" (1.676 m), weight 256 lb 12.8 oz (116.5 kg), SpO2 95 %. BP (!) 148/88 (BP Location: Left Arm, Patient Position: Sitting)   Pulse 85   Temp 99.4 F (37.4 C) (Oral)   Resp 18   Ht 5' 6" (1.676 m)   Wt 256 lb 12.8 oz (116.5 kg)   SpO2 95%   BMI 41.45 kg/m   General Appearance:    Alert, cooperative, no distress, appears stated age  Head:    Normocephalic, without obvious abnormality, atraumatic  Eyes:    PERRL, conjunctiva/corneas clear, EOM's intact, fundi    benign, both eyes        Throat:   Lips, mucosa, and tongue normal; teeth and gums normal  Neck:   Supple, symmetrical, trachea midline, no adenopathy;     thyroid:  no enlargement/tenderness/nodules; no carotid   bruit or JVD  Back:     Symmetric, no curvature, ROM normal, no CVA tenderness  Lungs:     Clear to auscultation bilaterally, respirations unlabored  Chest Wall:    No tenderness or deformity   Heart:    Regular rate and rhythm, S1 and S2 normal, no murmur, rub   or gallop     Abdomen:     Soft, non-tender, bowel sounds active all four quadrants,    no masses, no organomegaly        Extremities:   Extremities normal, atraumatic, no cyanosis or edema  Pulses:   2+ and symmetric all extremities  Skin:   Skin color, texture, turgor normal, no rashes or lesions  Lymph nodes:   Cervical, supraclavicular, and axillary nodes normal  Neurologic:   CNII-XII intact, normal strength, sensation and reflexes    throughout    LABORATORY DATA:  Results for orders placed or performed in visit on 12/06/16 (from the past 48 hour(s))  CBC w/Diff     Status: Abnormal   Collection Time: 12/06/16  2:48 PM  Result Value Ref Range   WBC 12.0 (H) 3.9 - 10.0 10e3/uL   RBC 4.38 3.70 - 5.32 10e6/uL   HGB 12.6 11.6 - 15.9 g/dL   HCT 37.7 34.8 - 46.6 %   MCV 86 81 - 101 fL   MCH 28.8 26.0 - 34.0 pg   MCHC 33.4 32.0 - 36.0 g/dL   RDW 13.8 11.1 - 15.7 %   Platelets 281 145 - 400 10e3/uL   NEUT# 6.3 1.5 - 6.5 10e3/uL   LYMPH# 4.4 (H) 0.9 - 3.3 10e3/uL   MONO# 0.9 0.1 - 0.9 10e3/uL   Eosinophils Absolute 0.3 0.0 - 0.5 10e3/uL   BASO# 0.1 0.0 - 0.2 10e3/uL   NEUT% 52.6 39.6 - 80.0 %   LYMPH% 36.8 14.0 - 48.0 %     MONO% 7.4 0.0 - 13.0 %   EOS% 2.7 0.0 - 7.0 %   BASO% 0.5 0.0 - 2.0 %  Smear     Status: None   Collection Time: 12/06/16  2:48 PM  Result Value Ref Range   Smear Result Smear Available       RADIOGRAPHY: No results found.     PATHOLOGY: None  ASSESSMENT/PLAN: Ms. Stream is a very pleasant 35 yo caucasian female with history of leukocytosis dating back as far as December 2014. She has a history of arthritis with positive ANA  and elevated sed rate on recent lab work.  She is symptomatic with fatigue, joint pain and hair loss. Her leukocytosis appears to be reactive in nature. She will be following up with rheumatology in June for further work up.  I have also added iron studies to her lab work. With her history of IBS and internal hemorrhoids this may be contributing to her symptoms.  We will plan to see her back in another 3 months for repepat lab work and follow-up. If everything at that time is stable we will let her go from our office and she can follow-up as needed.   All questions were answered. She will contact our office with any questions or concerns. We can certainly see her much sooner if necessary.  She was discussed with and also seen by Dr. Ennever and he is in agreement with the aforementioned.   , M    Addendum:  I saw and examined patient with . I agree with her above assessment.  I looked at her blood smear. I do not see anything on her blood smear that looked suspicious for a underlying hematologic malignancy. Her white cells appeared mature. There were no immature myeloid cells. There were no hypersegmented polys. There were no immature lymphoid cells. I saw no nucleated red cells. Platelets appeared adequate in morphology and maturity.  I believe that this mild leukocytosis is just reactive. I think she has a lot of other health issues. I think she probably has some underlying chronic inflammatory state that is triggering her leukocytosis.  On her exam I just cannot see any lymphadenopathy or feel any splenomegaly.  I think we should get her back in 3 months just to be thorough. I think if everything is stable in 3 months, then we can probably let her go from the clinic.  We spent about 40 minutes with her. We reviewed her lab work. She is very knowledgeable. She was a lot of fun to talk to.  Pete Ennever, MD  

## 2016-12-06 NOTE — Telephone Encounter (Signed)
Opened in error

## 2016-12-07 ENCOUNTER — Encounter: Payer: Self-pay | Admitting: Family

## 2016-12-07 LAB — LACTATE DEHYDROGENASE: LDH: 234 U/L (ref 125–245)

## 2016-12-08 ENCOUNTER — Other Ambulatory Visit: Payer: Self-pay | Admitting: Family

## 2016-12-08 MED ORDER — METOPROLOL SUCCINATE ER 50 MG PO TB24: 50.0000 mg | ORAL_TABLET | Freq: Every day | ORAL | 3 refills | Status: DC

## 2016-12-12 ENCOUNTER — Other Ambulatory Visit: Payer: Self-pay | Admitting: Family

## 2016-12-12 DIAGNOSIS — R7303 Prediabetes: Secondary | ICD-10-CM

## 2016-12-12 DIAGNOSIS — K529 Noninfective gastroenteritis and colitis, unspecified: Secondary | ICD-10-CM

## 2016-12-12 MED ORDER — PANCRELIPASE (LIP-PROT-AMYL) 24000-76000 UNITS PO CPEP: 72000.0000 [IU] | ORAL_CAPSULE | Freq: Three times a day (TID) | ORAL | 2 refills | Status: DC

## 2016-12-13 ENCOUNTER — Telehealth: Payer: Self-pay | Admitting: Family

## 2016-12-13 DIAGNOSIS — Z803 Family history of malignant neoplasm of breast: Secondary | ICD-10-CM

## 2016-12-13 NOTE — Telephone Encounter (Signed)
-----  Message from Eliezer Bottom, NP sent at 12/12/2016  8:37 AM EDT ----- Happy Monday! Hope you had a great weekend.  I saw Ms. Burson last week and it appears that her leukocytosis is likely reactive due to the arthritis. We will follow up with her again in 3 months.  She mentioned that she is prediabetic and having a lot of diarrhea so I am going to have her try Creon before meals and snacks and see if this gives her any relief. Also, Dr. Marin Olp felt that with her having had a fibroadenoma removed recently from the left breast and both her father's and paternal grandmother's history of breast cancer it would be worth having her see a geneticist for BRCA2 testing. Unfortunately, we are unable to perform that test here.  Thank you for everything you do for our patients. They always have nothing but great things to say about your office!  Judson Roch

## 2016-12-14 ENCOUNTER — Telehealth: Payer: Self-pay | Admitting: *Deleted

## 2016-12-14 NOTE — Telephone Encounter (Addendum)
Patient aware of new prescription  ----- Message from Eliezer Bottom, NP sent at 12/13/2016  9:35 PM EDT ----- Regarding: Creon I put in a prescription for her for Creon. Please let her know! Thank you!  Judson Roch

## 2016-12-22 ENCOUNTER — Encounter: Payer: Self-pay | Admitting: Pulmonary Disease

## 2016-12-22 ENCOUNTER — Ambulatory Visit (INDEPENDENT_AMBULATORY_CARE_PROVIDER_SITE_OTHER): Payer: Managed Care, Other (non HMO) | Admitting: Pulmonary Disease

## 2016-12-22 VITALS — BP 156/84 | HR 91 | Ht 66.0 in | Wt 260.4 lb

## 2016-12-22 DIAGNOSIS — Z6841 Body Mass Index (BMI) 40.0 and over, adult: Secondary | ICD-10-CM

## 2016-12-22 DIAGNOSIS — G4733 Obstructive sleep apnea (adult) (pediatric): Secondary | ICD-10-CM | POA: Diagnosis not present

## 2016-12-22 NOTE — Patient Instructions (Signed)
Will arrange for home sleep study Will call to arrange for follow up after sleep study reviewed  

## 2016-12-22 NOTE — Progress Notes (Signed)
Past Surgical History She  has a past surgical history that includes Dental surgery and laparoscopic appendectomy (N/A, 06/06/2013).  Allergies  Allergen Reactions  . Codeine Nausea Only    Needs antiemetic  . Adhesive [Tape]     Tapes, bandaids  . Amlodipine Other (See Comments)    EDEMA    Family History Her family history includes Asthma in her mother, sister, and sister; Breast cancer in her father; Cervical cancer in her maternal aunt; Colon cancer in her maternal grandmother; Diabetes in her father; Heart disease in her father; Hyperlipidemia in her brother and father; Hypertension in her brother, father, and mother; Liver cancer in her maternal grandmother; Melanoma in her maternal uncle; Vision loss in her brother.  Social History She  reports that she quit smoking about 3 years ago. Her smoking use included Cigarettes. She has a 20.00 pack-year smoking history. She has never used smokeless tobacco. She reports that she drinks alcohol. She reports that she does not use drugs.  Review of systems Constitutional: Negative for fever and unexpected weight change.  HENT: Positive for congestion, ear pain and sneezing. Negative for dental problem, nosebleeds, postnasal drip, rhinorrhea, sinus pressure, sore throat and trouble swallowing.   Eyes: Negative for redness and itching.  Respiratory: Positive for shortness of breath. Negative for cough, chest tightness and wheezing.   Cardiovascular: Positive for palpitations. Negative for leg swelling.  Gastrointestinal: Negative for nausea and vomiting.       Acid heartburn / indigestion  Genitourinary: Negative for dysuria.  Musculoskeletal: Negative for joint swelling.  Skin: Negative for rash (itching).  Neurological: Positive for headaches.  Hematological: Does not bruise/bleed easily.  Psychiatric/Behavioral: Positive for dysphoric mood. The patient is not nervous/anxious.     Current Outpatient Prescriptions on File Prior to Visit   Medication Sig  . clotrimazole-betamethasone (LOTRISONE) cream Apply 1 application topically 2 (two) times daily.  Marland Kitchen dicyclomine (BENTYL) 20 MG tablet TAKE 1 TABLET BY MOUTH 4 TIMES DAILY BEFORE MEALS AND AT BEDTIME  . escitalopram (LEXAPRO) 10 MG tablet Take 1 tablet (10 mg total) by mouth daily.  . furosemide (LASIX) 40 MG tablet Take 1 tablet (40 mg total) by mouth every morning.  Marland Kitchen losartan (COZAAR) 100 MG tablet Take 1 tablet (100 mg total) by mouth daily.  . meloxicam (MOBIC) 15 MG tablet Take 15 mg by mouth at bedtime.  . metoprolol succinate (TOPROL-XL) 50 MG 24 hr tablet Take 1 tablet (50 mg total) by mouth daily. Take with or immediately following a meal.  . mirtazapine (REMERON) 15 MG tablet Take 1 tablet (15 mg total) by mouth at bedtime.  . norgestimate-ethinyl estradiol (ORTHO-CYCLEN,SPRINTEC,PREVIFEM) 0.25-35 MG-MCG tablet Take 1 tablet by mouth every evening.  Marland Kitchen omeprazole (PRILOSEC) 20 MG capsule Take 68m before breakfast and 239mbefore supper.  . Pancrelipase, Lip-Prot-Amyl, (CREON) 24000-76000 units CPEP Take 3 capsules (72,000 Units total) by mouth 3 (three) times daily before meals.  . potassium chloride (K-DUR,KLOR-CON) 10 MEQ tablet Take 1 tablet (10 mEq total) by mouth every other day.  . SUMAtriptan (IMITREX) 50 MG tablet Take 1 tablet (50 mg total) by mouth every 2 (two) hours as needed for migraine. May repeat in 2 hours if headache persists or recurs.  . traZODone (DESYREL) 50 MG tablet Take 1-2 tablets (50-100 mg total) by mouth at bedtime.  . cetirizine (ZYRTEC) 10 MG tablet Take 1 tablet (10 mg total) by mouth daily. (Patient not taking: Reported on 12/22/2016)   No current facility-administered medications on  file prior to visit.     Chief Complaint  Patient presents with  . SLEEP CONSULT    Referred by Dr Joanie Coddington. Pt uses donated CPAP machine set at 9cm. Pt states that her CPAP no longer working and needs a new one and new DME. Pt states that she  may need a new sleep study bc her last sleep study was 2014. Pt notes chronic headaches/migraines which are less frequent while on CPAP.  Epworth Score 20    Cardiac tests Echo 09/23/15 >> EF 60 to 65%, bicuspid aortic valve with mild stenosis and mod AR  Past medical history She  has a past medical history of Allergy; Anxiety; Aortic regurgitation due to bicuspid aortic valve; Asthma; Borderline diabetes (10/20/2016); Chronic kidney disease; Depression; Fatty liver (11/28/2016); GERD (gastroesophageal reflux disease); Heart murmur; Hypertension; IBS (irritable bowel syndrome); Insomnia; Migraine headache; OCD (obsessive compulsive disorder); Orbital cellulitis; and S/P appy (05/2013).  Vital signs BP (!) 156/84 (BP Location: Left Arm, Cuff Size: Normal)   Pulse 91   Ht 5' 6"  (1.676 m)   Wt 260 lb 6.4 oz (118.1 kg)   SpO2 95%   BMI 42.03 kg/m   History of Present Illness Stephanie Miles is a 36 y.o. female for evaluation of sleep problems.  She had a sleep study several years ago.  She was not able to afford CPAP and was using CPAP loaner.  This broke.  She is now snoring, and stop breathing while asleep.  She can't sleep on her back.  Her mouth gets dry.  She has trouble staying focused when working on computer.  She goes to sleep at 1030 pm.  She falls asleep quickly.  She wakes up 1 or 2 times to use the bathroom.  She gets out of bed at 7 am.  She feels tired in the morning.  She frequently gets morning headaches.  She uses remeron and trazodone for her mood and these help her sleep.  She drinks coffee, soda, and red bull.  She denies sleep walking, sleep talking, bruxism, or nightmares.  There is no history of restless legs.  She denies sleep hallucinations, sleep paralysis, or cataplexy.  The Epworth score is 20 out of 24.   Physical Exam:  General - No distress ENT - No sinus tenderness, no oral exudate, no LAN, no thyromegaly, TM clear, pupils equal/reactive, MP 3, 2+  tonsils Breast - biopsy site clean/dry on Lt breast, mild excoriation just below this w/o excess warmth/tenderness/drainage Cardiac - s1s2 regular, no murmur, pulses symmetric Chest - No wheeze/rales/dullness, good air entry, normal respiratory excursion Back - No focal tenderness Abd - Soft, non-tender, no organomegaly, + bowel sounds Ext - No edema Neuro - Normal strength, cranial nerves intact Skin - No rashes Psych - Normal mood, and behavior  Discussion: She has snoring, sleep disruption, witnessed apnea, and daytime sleepiness.  She has hx of HTN, and mood disorder.  She has prior history of obstructive sleep apnea.  I am concerned she could still have sleep apnea.  We discussed how sleep apnea can affect various health problems, including risks for hypertension, cardiovascular disease, and diabetes.  We also discussed how sleep disruption can increase risks for accidents, such as while driving.  Weight loss as a means of improving sleep apnea was also reviewed.  Additional treatment options discussed were CPAP therapy, oral appliance, and surgical intervention.  Assessment/plan:  Obstructive sleep apnea. - will arrange for home sleep study  Obesity. - discussed importance  of weight loss  Breast biopsy. - biopsy site looks clean/dry - she has excoriation just below biopsy site >> does not appear infected at this time   Patient Instructions  Will arrange for home sleep study Will call to arrange for follow up after sleep study reviewed     Chesley Mires, M.D. Pager [phone] 12/22/2016, 5:08 PM

## 2016-12-22 NOTE — Progress Notes (Signed)
   Subjective:    Patient ID: Stephanie Miles, female    DOB: July 16, 1981, 36 y.o.   MRN: [id number]  HPI    Review of Systems  Constitutional: Negative for fever and unexpected weight change.  HENT: Positive for congestion, ear pain and sneezing. Negative for dental problem, nosebleeds, postnasal drip, rhinorrhea, sinus pressure, sore throat and trouble swallowing.   Eyes: Negative for redness and itching.  Respiratory: Positive for shortness of breath. Negative for cough, chest tightness and wheezing.   Cardiovascular: Positive for palpitations. Negative for leg swelling.  Gastrointestinal: Negative for nausea and vomiting.       Acid heartburn / indigestion  Genitourinary: Negative for dysuria.  Musculoskeletal: Negative for joint swelling.  Skin: Negative for rash (itching).  Neurological: Positive for headaches.  Hematological: Does not bruise/bleed easily.  Psychiatric/Behavioral: Positive for dysphoric mood. The patient is not nervous/anxious.        Objective:   Physical Exam        Assessment & Plan:

## 2016-12-23 ENCOUNTER — Other Ambulatory Visit (INDEPENDENT_AMBULATORY_CARE_PROVIDER_SITE_OTHER): Payer: Managed Care, Other (non HMO)

## 2016-12-23 DIAGNOSIS — K625 Hemorrhage of anus and rectum: Secondary | ICD-10-CM

## 2016-12-23 LAB — FECAL OCCULT BLOOD, IMMUNOCHEMICAL: FECAL OCCULT BLD: NEGATIVE

## 2016-12-23 NOTE — Progress Notes (Addendum)
Office Visit Note  Patient: Stephanie Miles             Date of Birth: 1980-09-15           MRN: [id number]             PCP: Debbrah Alar, NP Referring: Debbrah Alar, NP Visit Date: 12/28/2016 Occupation: medical device sales    Subjective:  Pain of the Left Knee; Pain of the Right Knee; Pain of the Lower Back; New Patient (Initial Visit) (+ANA ); and Joint Pain (all joints are painful knees back are the worst has swelling left side of body )   History of Present Illness: Stephanie Miles is a 36 y.o. female seen in consultation per request of her PCP. According to patient she had appendectomy November 2014. In December 2014 she was hospitalized with IBS flare. She states since then she's experienced fatigue, brain fall, muscle pain and weakness. She has seen her PCP several times and had lab work which showed elevated white cell count, elevated sedimentation rate, elevated LFTs. She is seeing cardiologist and she states her aortic regurgitation is getting worse. She's also had GI workup including colonoscopy which was negative for inflammatory bowel disease. She seen a psychiatrist and was treated for anxiety, depression and posttraumatic stress disorder. She states her symptoms have improved with treatment. She's also seen a pulmonologist for possible asthma but the workup was negative for asthma and she was diagnosed with reflux. She states the dose of omeprazole was increased which helped. She continues to have fatigue, poor balance and decreased appetite. She states despite of that she's been gaining weight. All of her joints are painful she describes pain in her bilateral hands and wrists bilateral knee joints and lower back. She seen an orthopedic surgeon who diagnosed her with osteoarthritis of her knee joints. 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 She was given knee joint braces which are being very helpful. She also describes pain in the  dorsum of her feet and calves. She feels there is electrical sensation and hypersensitivity to the touch in her extremities. She has some intermittent swelling in her lower extremities. She continues to have insomnia and she uses CPAP at night.  Activities of Daily Living:  Patient reports morning stiffness for 1-2 hours.   Patient Reports nocturnal pain.  Difficulty dressing/grooming: Denies Difficulty climbing stairs: Reports Difficulty getting out of chair: Reports Difficulty using hands for taps, buttons, cutlery, and/or writing: Denies   Review of Systems  Constitutional: Positive for fatigue. Negative for night sweats, weight gain, weight loss and weakness.  HENT: Negative for mouth sores, trouble swallowing, trouble swallowing, mouth dryness and nose dryness.   Eyes: Negative for pain, redness, visual disturbance and dryness.  Respiratory: Positive for shortness of breath. Negative for cough and difficulty breathing.   Cardiovascular: Negative for chest pain, palpitations, hypertension, irregular heartbeat and swelling in legs/feet.  Gastrointestinal: Negative for blood in stool, constipation and diarrhea.  Endocrine: Negative for increased urination.  Genitourinary: Negative for vaginal dryness.  Musculoskeletal: Positive for arthralgias, joint pain, myalgias, morning stiffness and myalgias. Negative for joint swelling, muscle weakness and muscle tenderness.  Skin: Positive for hair loss. Negative for color change, rash, skin tightness, ulcers and sensitivity to sunlight.  Allergic/Immunologic: Negative for susceptible to infections.  Neurological: Negative for dizziness, memory loss and night sweats.  Hematological: Negative for swollen glands.  Psychiatric/Behavioral: Positive for depressed mood and sleep disturbance. The patient is nervous/anxious.  PMFS History:  Patient Active Problem List   Diagnosis Date Noted  . PTSD (post-traumatic stress disorder) 12/28/2016  .  Primary osteoarthritis of both knees 12/28/2016  . ANA positive 12/28/2016  . Leukocytosis 12/06/2016  . Fatty liver 11/28/2016  . History of kidney stones 10/21/2016  . Osteoarthritis 10/21/2016  . Borderline diabetes 10/20/2016  . Cough variant asthma vs uacs  07/07/2015  . Severe obesity (BMI >= 40) ([place]) 07/07/2015  . Dyspnea 07/07/2015  . Asthma 05/27/2015  . Aortic regurgitation due to bicuspid aortic valve 03/12/2014  . Personal history of tobacco use, presenting hazards to health 01/20/2014  . Anxiety state 10/16/2013  . Migraine 07/08/2013  . Sleep apnea 07/08/2013  . IBS (irritable bowel syndrome) 06/06/2013  . GERD (gastroesophageal reflux disease) 06/06/2013  . HTN (hypertension) 06/06/2013    Past Medical History:  Diagnosis Date  . Allergy   . Anxiety   . Aortic regurgitation due to bicuspid aortic valve   . Asthma   . Borderline diabetes 10/20/2016  . Chronic kidney disease    kidney stones  . Depression   . Fatty liver 11/28/2016  . GERD (gastroesophageal reflux disease)   . Heart murmur   . Hypertension   . IBS (irritable bowel syndrome)   . Insomnia   . Migraine headache   . OCD (obsessive compulsive disorder)   . Orbital cellulitis    at age 41 was hospitalized for 3 months (almost died)   . S/P appy 06/18/2013    Family History  Problem Relation Age of Onset  . Asthma Mother   . Hypertension Mother   . Breast cancer Father   . Diabetes Father   . Hypertension Father   . Heart disease Father   . Hyperlipidemia Father   . Asthma Sister   . Hyperlipidemia Brother   . Hypertension Brother   . Vision loss Brother   . Colon cancer Maternal Grandmother   . Liver cancer Maternal Grandmother   . Asthma Sister   . Cervical cancer Maternal Aunt   . Melanoma Maternal Uncle   . Esophageal cancer Neg Hx   . Pancreatic cancer Neg Hx   . Rectal cancer Neg Hx   . Stomach cancer Neg Hx    Past Surgical History:  Procedure Laterality Date  . DENTAL SURGERY      wisdom teeth  . LAPAROSCOPIC APPENDECTOMY N/A 06/06/2013   Procedure: APPENDECTOMY LAPAROSCOPIC;  Surgeon: Imogene Burn. Georgette Dover, MD;  Location: [facility];  Service: General;  Laterality: N/A;   Social History   Social History Narrative   Working at 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   Friends and family in [place]     Objective: Vital Signs: BP 138/80 (BP Location: Right Arm)   Pulse 92   Resp 16   Ht _0  (1.676 m)   Wt 260 lb (117.9 kg)   BMI 41.97 kg/m    Physical Exam  Constitutional: She is oriented to person, place, and time. She appears well-developed and well-nourished.  HENT:  Head: Normocephalic and atraumatic.  Eyes: Conjunctivae and EOM are normal.  Neck: Normal range of motion.  Cardiovascular: Normal rate, regular rhythm, normal heart sounds and intact distal pulses.   Pulmonary/Chest: Effort normal and breath sounds normal.  Abdominal: Soft. Bowel sounds are normal.  Lymphadenopathy:  She has no cervical adenopathy.  Neurological: She is alert and oriented to person, place, and time.  Skin: Skin is warm and dry. Capillary refill takes less than 2 seconds.  Psychiatric: She has a normal mood and affect. Her behavior is normal.  Nursing note and vitals reviewed.    Musculoskeletal Exam: C-spine and thoracic lumbar spine good range of motion she is some discomfort range of motion of her lumbar spine shoulder joints elbow joints wrist joints MCPs PIPs DIPs with good range of motion with no synovitis. She is some tenderness across her PIP joints. Hip joints knee joints ankles MTPs PIPs DIPs with good range of motion. She has discomfort across her MTP joints and dorsum of her foot. No synovitis was noted on examination today. Fibromyalgia tender points were 18 out of 18 positive she has generalized hyperalgesia.  CDAI Exam: No CDAI exam  completed.    Investigation: Findings:   ANA positive 1:160 titer ,CBC with Differential/Platelet 13.3 WBC  Lymphocytes 5.0 ,Sed Rate (ESR) 64 ,Rheumatoid Factor negative, and TSH normal 3.04     Imaging: Xr Foot 2 Views Left  Result Date: 01/04/2017 No MTP PIP/DIP narrowing was noted. Erosive changes were noted. Impression normal x-ray of the foot  Xr Foot 2 Views Right  Result Date: 01/04/2017 No MTP PIP/DIP narrowing was noted. Erosive changes were noted. A small calcaneal spur was noted.  Xr Hand 2 View Left  Result Date: 01/04/2017 PIP/DIP narrowing was noted. No MCP joint narrowing intercarpal joint narrowing was noted. No erosive changes were noted. X-ray was within normal limits.  Xr Hand 2 View Right  Result Date: 01/04/2017 PIP/DIP narrowing was noted. No MCP joint narrowing intercarpal joint narrowing was noted. No erosive changes were noted. X-ray was within normal limits.   Speciality Comments: No specialty comments available.    Procedures:  No procedures performed Allergies: Codeine; Adhesive [tape]; and Amlodipine   Assessment / Plan:     Visit Diagnoses: ANA positive: She has no clinical features of autoimmune disease on examination. She gives history of generalized arthralgias and hair thinning. I'll obtain following labs to evaluate this further.  Pain hands: She had no synovitis on examination I'll obtain x-rays and following labs.  Pain f feet: X-rays will be obtained today.  Osteoarthritis knees: Patient gives this diagnosis per her orthopedic surgeon. She's been using braces which are helpful.  Lower back pain: She states she has discussed disease I do not have the records to support that.  Other elevated white blood cell (WBC) count: Uncertain etiology  Severe obesity (BMI >= 40) ([place]): Patient states she has poor appetite but she still gains weight.  History of sleep apnea: Due to sleep apnea she has poor insomnia chronic fatigue and it  could be contributing to her via fascial pain or fibromyalgia syndrome.  History of migraine: Chronic headaches  History of gastroesophageal reflux (GERD)  History of hypertension  History of borderline diabetes mellitus  History of kidney stones  History of IBS  PTSD (post-traumatic stress disorder) on treatment per her psychiatrist  History of anxiety    Orders: Orders Placed This Encounter  Procedures  . XR Foot 2 Views Left  . XR Foot 2 Views Right  . XR Hand 2 View Right  . XR Hand 2 View Left  . Antinuclear Antib (ANA)  . [id number] ENA PANEL  . C3 and C4  . Cyclic citrul peptide antibody, IgG  . Serum protein electrophoresis with reflex  . Sedimentation  rate  . CK  . COMPLETE METABOLIC PANEL WITH GFR  . Pan-ANCA  . Anti-nuclear ab-titer (ANA titer)   No orders of the defined types were placed in this encounter.   Face-to-face time spent with patient was 60 minutes. 50% of time was spent in counseling and coordination of care.  Follow-Up Instructions: Return for +ANA, arthralgias.   Bo Merino, MD  Note - This record has been created using Editor, commissioning.  Chart creation errors have been sought, but may not always  have been located. Such creation errors do not reflect on  the standard of medical care.

## 2016-12-26 ENCOUNTER — Encounter: Payer: Self-pay | Admitting: Family

## 2016-12-26 ENCOUNTER — Ambulatory Visit (INDEPENDENT_AMBULATORY_CARE_PROVIDER_SITE_OTHER): Payer: Managed Care, Other (non HMO) | Admitting: Family

## 2016-12-26 ENCOUNTER — Telehealth: Payer: Self-pay | Admitting: Pulmonary Disease

## 2016-12-26 VITALS — BP 138/75 | HR 95 | Temp 98.7°F | Resp 18 | Ht 66.0 in | Wt 249.0 lb

## 2016-12-26 DIAGNOSIS — Z803 Family history of malignant neoplasm of breast: Secondary | ICD-10-CM | POA: Diagnosis not present

## 2016-12-26 DIAGNOSIS — I10 Essential (primary) hypertension: Secondary | ICD-10-CM | POA: Diagnosis not present

## 2016-12-26 NOTE — Patient Instructions (Signed)
Continue current dose of metoprolol.

## 2016-12-26 NOTE — Progress Notes (Signed)
Subjective:    Patient ID: Stephanie Miles, female    DOB: January 30, 1981, 36 y.o.   MRN: [id number]  HPI  Stephanie Miles is a 36 yr old female who presents today for follow up.  1) HTN- she was placed on metoprolol in addition to lasix and cozaar.  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 She has an appointment with the genetic counselor in July. Dad had breast cancer and died at age 79.   Review of Systems    see HPI  Past Medical History:  Diagnosis Date  . Allergy   . Anxiety   . Aortic regurgitation due to bicuspid aortic valve   . Asthma   . Borderline diabetes 10/20/2016  . Chronic kidney disease    kidney stones  . Depression   . Fatty liver 11/28/2016  . GERD (gastroesophageal reflux disease)   . Heart murmur   . Hypertension   . IBS (irritable bowel syndrome)   . Insomnia   . Migraine headache   . OCD (obsessive compulsive disorder)   . Orbital cellulitis    at age 46 was hospitalized for 3 months (almost died)   . S/P appy 07-03-13     Social History   Social History  . Marital status: Single    Spouse name: N/A  . Number of children: 0  . Years of education: N/A   Occupational History  . Student   . Medical Sales    Social History Main Topics  . Smoking status: Former Smoker    Packs/day: 1.00    Years: 20.00    Types: Cigarettes    Quit date: 02/22/2013  . Smokeless tobacco: Never Used  . Alcohol use 0.0 oz/week     Comment: social  . Drug use: No  . Sexual activity: Not on file   Other Topics Concern  . Not on file   Social History Narrative   Working at 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   Friends and family in [place]    Past Surgical History:  Procedure Laterality Date  . DENTAL SURGERY     wisdom  teeth  . LAPAROSCOPIC APPENDECTOMY N/A 06/06/2013   Procedure: APPENDECTOMY LAPAROSCOPIC;  Surgeon: Imogene Burn. Georgette Dover, MD;  Location: [facility];  Service: General;  Laterality: N/A;    Family History  Problem Relation Age of Onset  . Asthma Mother   . Hypertension Mother   . Breast cancer Father   . Diabetes Father   . Hypertension Father   . Heart disease Father   . Hyperlipidemia Father   . Asthma Sister   . Hyperlipidemia Brother   . Hypertension Brother   . Vision loss Brother   . Colon cancer Maternal Grandmother   . Liver cancer Maternal Grandmother   . Asthma Sister   . Cervical cancer Maternal Aunt   . Melanoma Maternal Uncle   . Esophageal cancer Neg Hx   . Pancreatic cancer Neg Hx   . Rectal cancer Neg Hx   . Stomach cancer Neg Hx     Allergies  Allergen Reactions  . Codeine Nausea Only    Needs antiemetic  . Adhesive [Tape]     Tapes, bandaids  . Amlodipine  Other (See Comments)    EDEMA    Current Outpatient Prescriptions on File Prior to Visit  Medication Sig Dispense Refill  . cetirizine (ZYRTEC) 10 MG tablet Take 1 tablet (10 mg total) by mouth daily. 30 tablet 5  . clotrimazole-betamethasone (LOTRISONE) cream Apply 1 application topically 2 (two) times daily. 30 g 1  . dicyclomine (BENTYL) 20 MG tablet TAKE 1 TABLET BY MOUTH 4 TIMES DAILY BEFORE MEALS AND AT BEDTIME 120 tablet 5  . escitalopram (LEXAPRO) 10 MG tablet Take 1 tablet (10 mg total) by mouth daily. 90 tablet 1  . fluticasone (FLONASE) 50 MCG/ACT nasal spray Place 2 sprays into both nostrils daily.    . furosemide (LASIX) 40 MG tablet Take 1 tablet (40 mg total) by mouth every morning. 30 tablet 4  . losartan (COZAAR) 100 MG tablet Take 1 tablet (100 mg total) by mouth daily. 30 tablet 4  . meloxicam (MOBIC) 15 MG tablet Take 15 mg by mouth at bedtime.    . metoprolol succinate (TOPROL-XL) 50 MG 24 hr tablet Take 1 tablet (50 mg total) by mouth daily. Take with or immediately following a meal. 30  tablet 3  . mirtazapine (REMERON) 15 MG tablet Take 1 tablet (15 mg total) by mouth at bedtime. 30 tablet 5  . norgestimate-ethinyl estradiol (ORTHO-CYCLEN,SPRINTEC,PREVIFEM) 0.25-35 MG-MCG tablet Take 1 tablet by mouth every evening. 1 Package 11  . omeprazole (PRILOSEC) 20 MG capsule Take 8m before breakfast and 234mbefore supper. 90 capsule 5  . Pancrelipase, Lip-Prot-Amyl, (CREON) 24000-76000 units CPEP Take 3 capsules (72,000 Units total) by mouth 3 (three) times daily before meals. 270 capsule 2  . potassium chloride (K-DUR,KLOR-CON) 10 MEQ tablet Take 1 tablet (10 mEq total) by mouth every other day. 15 tablet 5  . SUMAtriptan (IMITREX) 50 MG tablet Take 1 tablet (50 mg total) by mouth every 2 (two) hours as needed for migraine. May repeat in 2 hours if headache persists or recurs. 10 tablet 0  . traZODone (DESYREL) 50 MG tablet Take 1-2 tablets (50-100 mg total) by mouth at bedtime. 60 tablet 5   No current facility-administered medications on file prior to visit.     BP 138/75   Pulse 95   Temp 98.7 F (37.1 C) (Oral)   Resp 18   Ht 5' 6"  (1.676 m)   Wt 249 lb (112.9 kg)   SpO2 98%   BMI 40.19 kg/m    Objective:   Physical Exam  Constitutional: She is oriented to person, place, and time. She appears well-developed and well-nourished.  HENT:  Head: Normocephalic and atraumatic.  Cardiovascular: Normal rate, regular rhythm and normal heart sounds.   No murmur heard. Pulmonary/Chest: Effort normal and breath sounds normal. No respiratory distress. She has no wheezes.  Neurological: She is alert and oriented to person, place, and time.  Skin:  Scab left breast at site of breast biopsy.  + adjacent scab from bandage irritation.   Psychiatric: She has a normal mood and affect. Her behavior is normal. Judgment and thought content normal.          Assessment & Plan:  HTN- improved. Continue current meds at current dosages.  Family hx of breast cancer- advised pt to  keep her upcoming appointment for genetic testing.  Advised pt to keep an eye on the breast wounds and call if increased redness or if scabs do not continue to heal.

## 2016-12-27 NOTE — Telephone Encounter (Signed)
Stephanie Miles has not authorized it yet and when they do it will be 2-3 wks before she gets the study done a pcc will call her to set up the appt to come pick up the [name]

## 2016-12-27 NOTE — Telephone Encounter (Signed)
Paperwork given to dr sood to complete for [facility]

## 2016-12-27 NOTE — Telephone Encounter (Signed)
Per Ashtyn, Dr. Halford Chessman is aware and will sign once he returns to office.

## 2016-12-28 ENCOUNTER — Ambulatory Visit (INDEPENDENT_AMBULATORY_CARE_PROVIDER_SITE_OTHER): Payer: Managed Care, Other (non HMO)

## 2016-12-28 ENCOUNTER — Ambulatory Visit (INDEPENDENT_AMBULATORY_CARE_PROVIDER_SITE_OTHER): Payer: Managed Care, Other (non HMO) | Admitting: Rheumatology

## 2016-12-28 ENCOUNTER — Encounter: Payer: Self-pay | Admitting: Rheumatology

## 2016-12-28 VITALS — BP 138/80 | HR 92 | Resp 16 | Ht 66.0 in | Wt 260.0 lb

## 2016-12-28 DIAGNOSIS — M170 Bilateral primary osteoarthritis of knee: Secondary | ICD-10-CM | POA: Insufficient documentation

## 2016-12-28 DIAGNOSIS — Z8679 Personal history of other diseases of the circulatory system: Secondary | ICD-10-CM

## 2016-12-28 DIAGNOSIS — M79642 Pain in left hand: Secondary | ICD-10-CM | POA: Diagnosis not present

## 2016-12-28 DIAGNOSIS — Z87442 Personal history of urinary calculi: Secondary | ICD-10-CM

## 2016-12-28 DIAGNOSIS — M79671 Pain in right foot: Secondary | ICD-10-CM | POA: Diagnosis not present

## 2016-12-28 DIAGNOSIS — D72828 Other elevated white blood cell count: Secondary | ICD-10-CM | POA: Diagnosis not present

## 2016-12-28 DIAGNOSIS — Z8669 Personal history of other diseases of the nervous system and sense organs: Secondary | ICD-10-CM | POA: Diagnosis not present

## 2016-12-28 DIAGNOSIS — Z87898 Personal history of other specified conditions: Secondary | ICD-10-CM

## 2016-12-28 DIAGNOSIS — F4310 Post-traumatic stress disorder, unspecified: Secondary | ICD-10-CM

## 2016-12-28 DIAGNOSIS — M79672 Pain in left foot: Secondary | ICD-10-CM | POA: Diagnosis not present

## 2016-12-28 DIAGNOSIS — R768 Other specified abnormal immunological findings in serum: Secondary | ICD-10-CM | POA: Diagnosis not present

## 2016-12-28 DIAGNOSIS — Z8659 Personal history of other mental and behavioral disorders: Secondary | ICD-10-CM

## 2016-12-28 DIAGNOSIS — R7689 Other specified abnormal immunological findings in serum: Secondary | ICD-10-CM

## 2016-12-28 DIAGNOSIS — Z8719 Personal history of other diseases of the digestive system: Secondary | ICD-10-CM

## 2016-12-28 DIAGNOSIS — M79641 Pain in right hand: Secondary | ICD-10-CM

## 2016-12-28 HISTORY — DX: Post-traumatic stress disorder, unspecified: F43.10

## 2016-12-28 HISTORY — DX: Bilateral primary osteoarthritis of knee: M17.0

## 2016-12-28 HISTORY — DX: Other specified abnormal immunological findings in serum: R76.8

## 2016-12-28 LAB — COMPLETE METABOLIC PANEL WITH GFR
ALBUMIN: 3.8 g/dL (ref 3.6–5.1)
ALK PHOS: 102 U/L (ref 33–115)
ALT: 127 U/L — AB (ref 6–29)
AST: 131 U/L — AB (ref 10–30)
BILIRUBIN TOTAL: 0.3 mg/dL (ref 0.2–1.2)
BUN: 12 mg/dL (ref 7–25)
CALCIUM: 8.6 mg/dL (ref 8.6–10.2)
CHLORIDE: 104 mmol/L (ref 98–110)
CO2: 23 mmol/L (ref 20–31)
CREATININE: 0.86 mg/dL (ref 0.50–1.10)
GFR, Est Non African American: 86 mL/min (ref >=60)
Glucose, Bld: 135 mg/dL — ABNORMAL HIGH (ref 65–99)
Potassium: 4.2 mmol/L (ref 3.5–5.3)
Sodium: 139 mmol/L (ref 135–146)
TOTAL PROTEIN: 6.9 g/dL (ref 6.1–8.1)

## 2016-12-28 NOTE — Telephone Encounter (Signed)
lmtcb Stephanie Miles ° °

## 2016-12-28 NOTE — Telephone Encounter (Signed)
Spoke to pt she is aware we are waiting on dr sood to complete paperwork to be faxed to [facility] once precert is done we will call to set up hst she is aware it might be 2-3 wks Joellen Jersey

## 2016-12-29 LAB — ANA: Anti Nuclear Antibody(ANA): POSITIVE — AB

## 2016-12-29 LAB — CP5000020 ENA PANEL
ENA SM AB SER-ACNC: NEGATIVE
RIBONUCLEIC PROTEIN(ENA) ANTIBODY, IGG: NEGATIVE
SCLERODERMA (SCL-70) (ENA) ANTIBODY, IGG: NEGATIVE
SSA (Ro) (ENA) Antibody, IgG: <1
SSB (LA) (ENA) ANTIBODY, IGG: NEGATIVE
ds DNA Ab: <1 IU/mL

## 2016-12-29 LAB — CK: Total CK: 91 U/L (ref 29–143)

## 2016-12-29 LAB — PAN-ANCA
ANCA SCREEN: NEGATIVE
Serine Protease 3: <1

## 2016-12-29 LAB — ANTI-NUCLEAR AB-TITER (ANA TITER)

## 2016-12-29 LAB — SEDIMENTATION RATE: Sed Rate: 30 mm/hr — ABNORMAL HIGH (ref 0–20)

## 2016-12-29 LAB — CYCLIC CITRUL PEPTIDE ANTIBODY, IGG: Cyclic Citrullin Peptide Ab: <16 Units

## 2016-12-30 LAB — C3 AND C4
C3 COMPLEMENT: 199 mg/dL — AB (ref 83–193)
C4 Complement: 28 mg/dL (ref 15–57)

## 2016-12-30 LAB — PROTEIN ELECTROPHORESIS, SERUM, WITH REFLEX
ALBUMIN ELP: 3.6 g/dL — AB (ref 3.8–4.8)
ALPHA-1-GLOBULIN: 0.4 g/dL — AB (ref 0.2–0.3)
ALPHA-2-GLOBULIN: 0.9 g/dL (ref 0.5–0.9)
BETA 2: 0.4 g/dL (ref 0.2–0.5)
BETA GLOBULIN: 0.6 g/dL (ref 0.4–0.6)
GAMMA GLOBULIN: 1 g/dL (ref 0.8–1.7)
TOTAL PROTEIN, SERUM ELECTROPHOR: 6.9 g/dL (ref 6.1–8.1)

## 2016-12-31 ENCOUNTER — Encounter: Payer: Self-pay | Admitting: Genetic Counselor

## 2016-12-31 ENCOUNTER — Telehealth: Payer: Self-pay | Admitting: Genetic Counselor

## 2016-12-31 NOTE — Telephone Encounter (Signed)
Appt has been scheduled for the pt to see Stephanie Miles for genetic counseling on 7/16 at 2pm. Will mail the pt a letter.

## 2017-01-03 NOTE — Progress Notes (Signed)
Elevated LFTs . Please advise Pt and ask her to see her GI. Fax results to her GI

## 2017-01-04 ENCOUNTER — Telehealth: Payer: Self-pay | Admitting: Radiology

## 2017-01-04 ENCOUNTER — Encounter: Payer: Self-pay | Admitting: Rheumatology

## 2017-01-04 NOTE — Telephone Encounter (Signed)
Sent a my chart message. Have advised her to call me gave her direct number to call back.

## 2017-01-04 NOTE — Telephone Encounter (Signed)
She has called me and is aware her LFTs were elevated and she needs to follow up with Dr Fuller Plan. She will call for appointment.

## 2017-01-04 NOTE — Telephone Encounter (Signed)
-----   Message from Bo Merino, MD sent at 01/03/2017  1:08 PM EDT ----- Elevated LFTs . Please advise Pt and ask her to see her GI. Fax results to her GI

## 2017-01-04 NOTE — Telephone Encounter (Signed)
Called patient to advise labs are abnormal. LFTs quite elevated. Left message for her to call back for lab results.   Need to know who is GI doctor so I can send results.

## 2017-01-04 NOTE — Telephone Encounter (Signed)
Patient returned your call.

## 2017-01-06 ENCOUNTER — Encounter: Payer: Self-pay | Admitting: Family

## 2017-01-06 NOTE — Telephone Encounter (Signed)
Patient called to ensue My Chart message was received regarding requesting accu chek guide meter and test strips  [facility] [place], [place]. [address] [phone] (Phone) [phone] (Fax)

## 2017-01-06 NOTE — Telephone Encounter (Signed)
OK to send orders please.

## 2017-01-09 MED ORDER — ACCU-CHEK GUIDE W/DEVICE KIT: 1.0000 | PACK | Freq: Every day | 0 refills | Status: DC

## 2017-01-09 MED ORDER — GLUCOSE BLOOD VI STRP: ORAL_STRIP | 1 refills | Status: DC

## 2017-01-09 MED ORDER — ACCU-CHEK SOFTCLIX LANCETS MISC: 1 refills | Status: DC

## 2017-01-09 NOTE — Telephone Encounter (Signed)
Rxs sent. Message sent to pt.

## 2017-01-11 ENCOUNTER — Encounter: Payer: Self-pay | Admitting: Emergency Medicine

## 2017-01-11 ENCOUNTER — Other Ambulatory Visit (INDEPENDENT_AMBULATORY_CARE_PROVIDER_SITE_OTHER): Payer: Managed Care, Other (non HMO)

## 2017-01-11 ENCOUNTER — Ambulatory Visit (INDEPENDENT_AMBULATORY_CARE_PROVIDER_SITE_OTHER): Payer: Managed Care, Other (non HMO) | Admitting: Gastroenterology

## 2017-01-11 ENCOUNTER — Encounter: Payer: Self-pay | Admitting: Gastroenterology

## 2017-01-11 VITALS — BP 136/84 | HR 72 | Ht 66.0 in | Wt 257.0 lb

## 2017-01-11 DIAGNOSIS — R945 Abnormal results of liver function studies: Secondary | ICD-10-CM

## 2017-01-11 DIAGNOSIS — R1011 Right upper quadrant pain: Secondary | ICD-10-CM | POA: Insufficient documentation

## 2017-01-11 DIAGNOSIS — K580 Irritable bowel syndrome with diarrhea: Secondary | ICD-10-CM | POA: Diagnosis not present

## 2017-01-11 DIAGNOSIS — R7989 Other specified abnormal findings of blood chemistry: Secondary | ICD-10-CM

## 2017-01-11 DIAGNOSIS — K760 Fatty (change of) liver, not elsewhere classified: Secondary | ICD-10-CM

## 2017-01-11 HISTORY — DX: Other specified abnormal findings of blood chemistry: R79.89

## 2017-01-11 HISTORY — DX: Right upper quadrant pain: R10.11

## 2017-01-11 LAB — IBC PANEL
Iron: 48 ug/dL (ref 42–145)
SATURATION RATIOS: 11 % — AB (ref 20.0–50.0)
Transferrin: 313 mg/dL (ref 212.0–360.0)

## 2017-01-11 LAB — HEPATIC FUNCTION PANEL
ALBUMIN: 4 g/dL (ref 3.5–5.2)
ALK PHOS: 89 U/L (ref 39–117)
ALT: 88 U/L — ABNORMAL HIGH (ref 0–35)
AST: 83 U/L — ABNORMAL HIGH (ref 0–37)
BILIRUBIN DIRECT: 0.1 mg/dL (ref 0.0–0.3)
BILIRUBIN TOTAL: 0.3 mg/dL (ref 0.2–1.2)
Total Protein: 7.1 g/dL (ref 6.0–8.3)

## 2017-01-11 LAB — IGA: IGA: 195 mg/dL (ref 68–378)

## 2017-01-11 LAB — FERRITIN: FERRITIN: 77.5 ng/mL (ref 10.0–291.0)

## 2017-01-11 NOTE — Patient Instructions (Addendum)
Your physician has requested that you go to the basement for lab work before leaving today.  Please purchase the following medications over the counter and take as directed: IB gard as directed.   Increase Creon to with meals and snacks.    You have been scheduled for a HIDA scan at [facility] (1st floor) on _____. Please arrive 15 minutes prior to your scheduled appointment at  _____. Make certain not to have anything to eat or drink at least 6 hours prior to your test. Should this appointment date or time not work well for you, please call radiology scheduling at [phone].  _____________________________________________________________________ hepatobiliary (HIDA) scan is an imaging procedure used to diagnose problems in the liver, gallbladder and bile ducts. In the HIDA scan, a radioactive chemical or tracer is injected into a vein in your arm. The tracer is handled by the liver like bile. Bile is a fluid produced and excreted by your liver that helps your digestive system break down fats in the foods you eat. Bile is stored in your gallbladder and the gallbladder releases the bile when you eat a meal. A special nuclear medicine scanner (gamma camera) tracks the flow of the tracer from your liver into your gallbladder and small intestine.  During your HIDA scan  You'll be asked to change into a hospital gown before your HIDA scan begins. Your health care team will position you on a table, usually on your back. The radioactive tracer is then injected into a vein in your arm.The tracer travels through your bloodstream to your liver, where it's taken up by the bile-producing cells. The radioactive tracer travels with the bile from your liver into your gallbladder and through your bile ducts to your small intestine.You may feel some pressure while the radioactive tracer is injected into your vein. As you lie on the table, a special gamma camera is positioned over your abdomen taking pictures  of the tracer as it moves through your body. The gamma camera takes pictures continually for about an hour. You'll need to keep still during the HIDA scan. This can become uncomfortable, but you may find that you can lessen the discomfort by taking deep breaths and thinking about other things. Tell your health care team if you're uncomfortable. The radiologist will watch on a computer the progress of the radioactive tracer through your body. The HIDA scan may be stopped when the radioactive tracer is seen in the gallbladder and enters your small intestine. This typically takes about an hour. In some cases extra imaging will be performed if original images aren't satisfactory, if morphine is given to help visualize the gallbladder or if the medication CCK is given to look at the contraction of the gallbladder. This test typically takes 2 hours to complete. ________________________________________________________________________

## 2017-01-11 NOTE — Progress Notes (Signed)
Reviewed and agree with initial management plan.  Dominiqua Cooner T. Naydelin Ziegler, MD FACG 

## 2017-01-11 NOTE — Progress Notes (Signed)
01/11/2017 Stephanie Miles [id number] 1981/05/24   HISTORY OF PRESENT ILLNESS:  This is a 36 year old female who is known to Dr. Fuller Plan for management of her IBS-D with gas and bloating. Has also been evaluated some for right upper quadrant abdominal pain. Has had some elevated LFTs and noted to have fatty liver on imaging. She is here today at the request of Dr. Estanislado Pandy in regards to recent bump in her LFTs. Most recently a couple of weeks ago AST was 131 and ALT 127. Alkaline phosphatase and bilirubin have remained normal. Viral hepatitis studies have been negative. Once again, she does have fatty liver by ultrasound. Does not take any over-the-counter herbals, supplements, etc. Uses only very rare social alcohol.  While she is here she also revisits her chronic ongoing issues of IBS with diarrhea, gas, bloating. She actually was recently placed on Creon 72,000 units by mouth 3 times daily before meals. This was given to her by the hematologist/oncologist who she had seen for a persistently elevated white blood cell count. She says that she has noticed some improvement with that, but admits that she is only taking it once, sometimes twice daily.  Admits that she does not have the best eating habits, but that is due to fear of eating during the day and possibly having diarrhea while she is trying to work.  Also revisits right upper quadrant abdominal pain. This has been evaluated with abdominal ultrasound, which showed no gallbladder issues. She describes this as intermittent right upper quadrant abdominal pain that has been occurring on and off for about 4 years. Says that recently has been occurring 3-4 times per week. It is quite severe when it is present. Previous plan was to consider HIDA scan and possible EGD if her pain continued.   Past Medical History:  Diagnosis Date  . Allergy   . Anxiety   . Aortic regurgitation due to bicuspid aortic valve   . Asthma   . Borderline diabetes  10/20/2016  . Chronic kidney disease    kidney stones  . Depression   . Fatty liver 11/28/2016  . GERD (gastroesophageal reflux disease)   . Heart murmur   . Hypertension   . IBS (irritable bowel syndrome)   . Insomnia   . Migraine headache   . OCD (obsessive compulsive disorder)   . Orbital cellulitis    at age 37 was hospitalized for 3 months (almost died)   . S/P appy 07-03-13   Past Surgical History:  Procedure Laterality Date  . DENTAL SURGERY     wisdom teeth  . LAPAROSCOPIC APPENDECTOMY N/A 06/06/2013   Procedure: APPENDECTOMY LAPAROSCOPIC;  Surgeon: Imogene Burn. Georgette Dover, MD;  Location: [facility];  Service: General;  Laterality: N/A;    reports that she quit smoking about 3 years ago. Her smoking use included Cigarettes. She has a 20.00 pack-year smoking history. She has never used smokeless tobacco. She reports that she drinks alcohol. She reports that she does not use drugs. family history includes Asthma in her mother, sister, and sister; Breast cancer in her father; Cervical cancer in her maternal aunt; Colon cancer in her maternal grandmother; Diabetes in her father; Heart disease in her father; Hyperlipidemia in her brother and father; Hypertension in her brother, father, and mother; Liver cancer in her maternal grandmother; Melanoma in her maternal uncle; Vision loss in her brother. Allergies  Allergen Reactions  . Codeine Nausea Only    Needs antiemetic  . Adhesive [Tape]  Tapes, bandaids  . Amlodipine Other (See Comments)    EDEMA      Outpatient Encounter Prescriptions as of 01/11/2017  Medication Sig  . ACCU-CHEK SOFTCLIX LANCETS lancets Use as instructed  . Blood Glucose Monitoring Suppl (ACCU-CHEK GUIDE) w/Device KIT 1 each by Does not apply route daily.  . clotrimazole-betamethasone (LOTRISONE) cream Apply 1 application topically 2 (two) times daily.  Marland Kitchen dicyclomine (BENTYL) 20 MG tablet TAKE 1 TABLET BY MOUTH 4 TIMES DAILY BEFORE MEALS AND AT BEDTIME  .  escitalopram (LEXAPRO) 10 MG tablet Take 1 tablet (10 mg total) by mouth daily.  . fluticasone (FLONASE) 50 MCG/ACT nasal spray Place 2 sprays into both nostrils daily.  . furosemide (LASIX) 40 MG tablet Take 1 tablet (40 mg total) by mouth every morning.  Marland Kitchen glucose blood (ACCU-CHEK GUIDE) test strip Use as instructed to check blood sugar once a day.  . losartan (COZAAR) 100 MG tablet Take 1 tablet (100 mg total) by mouth daily.  . meloxicam (MOBIC) 15 MG tablet Take 15 mg by mouth at bedtime.  . metoprolol succinate (TOPROL-XL) 50 MG 24 hr tablet Take 1 tablet (50 mg total) by mouth daily. Take with or immediately following a meal.  . mirtazapine (REMERON) 15 MG tablet Take 1 tablet (15 mg total) by mouth at bedtime.  . norgestimate-ethinyl estradiol (ORTHO-CYCLEN,SPRINTEC,PREVIFEM) 0.25-35 MG-MCG tablet Take 1 tablet by mouth every evening.  Marland Kitchen omeprazole (PRILOSEC) 20 MG capsule Take 1m before breakfast and 220mbefore supper.  . Pancrelipase, Lip-Prot-Amyl, (CREON) 24000-76000 units CPEP Take 3 capsules (72,000 Units total) by mouth 3 (three) times daily before meals.  . potassium chloride (K-DUR,KLOR-CON) 10 MEQ tablet Take 1 tablet (10 mEq total) by mouth every other day.  . SUMAtriptan (IMITREX) 50 MG tablet Take 1 tablet (50 mg total) by mouth every 2 (two) hours as needed for migraine. May repeat in 2 hours if headache persists or recurs.  . traZODone (DESYREL) 50 MG tablet Take 1-2 tablets (50-100 mg total) by mouth at bedtime.  . [DISCONTINUED] cetirizine (ZYRTEC) 10 MG tablet Take 1 tablet (10 mg total) by mouth daily. (Patient not taking: Reported on 12/28/2016)   No facility-administered encounter medications on file as of 01/11/2017.      REVIEW OF SYSTEMS  : All other systems reviewed and negative except where noted in the History of Present Illness.   PHYSICAL EXAM: BP 136/84   Pulse 72   Ht 5' 6"  (1.676 m)   Wt 257 lb (116.6 kg)   BMI 41.48 kg/m  General: Well developed  white female in no acute distress Head: Normocephalic and atraumatic Eyes:  Sclerae anicteric, conjunctiva pink. Ears: Normal auditory acuity Lungs: Clear throughout to auscultation; no increased WOB. Heart: Regular rate and rhythm Abdomen: Soft, non-distended. Normal bowel sounds.  Mild RUQ TTP. Musculoskeletal: Symmetrical with no gross deformities  Skin: No lesions on visible extremities Extremities: No edema  Neurological: Alert oriented x 4, grossly non-focal Psychological:  Alert and cooperative. Normal mood and affect  ASSESSMENT AND PLAN: -Elevated LFT's:  Has fatty liver, which can account for some elevation, but transaminases have been over 100 at times.  ALP and bili normal.  Will perform remaining serologic evaluation in regards to causes of chronic liver disease. -RUQ abdominal pain:  Intermittent for years.  Now 3-4 times per week.  ? Biliary dysfunction.  Will check HIDA scan. -IBS with gas, bloating, diarrhea:  Was recently placed on Creon 72,000 units TID with meals, which she has  only been taking once or twice a day.  Has noticed improvement with that.  Advised to be sure to take with all meals and can take 1-2 pills with snacks as well.  Bentyl prn.  Will try IBgard for the gas/bloating.   CC:  Debbrah Alar, NP

## 2017-01-12 LAB — TISSUE TRANSGLUTAMINASE, IGA: TISSUE TRANSGLUTAMINASE AB, IGA: 1 U/mL (ref <4)

## 2017-01-13 ENCOUNTER — Telehealth: Payer: Self-pay | Admitting: Gastroenterology

## 2017-01-13 LAB — ALPHA-1-ANTITRYPSIN: A1 ANTITRYPSIN SER: 230 mg/dL — AB (ref 83–199)

## 2017-01-13 LAB — MITOCHONDRIAL/SMOOTH MUSCLE AB PNL
MITOCHONDRIAL AB: 4.2 U (ref 0.0–20.0)
SMOOTH MUSCLE AB: 12 U (ref 0–19)

## 2017-01-13 LAB — CERULOPLASMIN: Ceruloplasmin: 45 mg/dL (ref 18–53)

## 2017-01-13 NOTE — Telephone Encounter (Signed)
The pt was notified of the results via My Chart

## 2017-01-20 ENCOUNTER — Telehealth: Payer: Self-pay | Admitting: Pulmonary Disease

## 2017-01-20 NOTE — Telephone Encounter (Signed)
Before we can placed an order for a replacement CPAP machine we will have to have the HST results first to confirm OSA. LM x 1 for patient to make aware of this.

## 2017-01-20 NOTE — Telephone Encounter (Signed)
We will need an order first.

## 2017-01-22 ENCOUNTER — Other Ambulatory Visit (INDEPENDENT_AMBULATORY_CARE_PROVIDER_SITE_OTHER): Payer: Self-pay | Admitting: Orthopaedic Surgery

## 2017-01-23 ENCOUNTER — Encounter (HOSPITAL_COMMUNITY)
Admission: RE | Admit: 2017-01-23 | Discharge: 2017-01-23 | Disposition: A | Discharge status: Home / Self Care | Payer: Managed Care, Other (non HMO) | Source: Ambulatory Visit | Attending: Gastroenterology | Admitting: Gastroenterology

## 2017-01-23 DIAGNOSIS — G4733 Obstructive sleep apnea (adult) (pediatric): Secondary | ICD-10-CM | POA: Diagnosis not present

## 2017-01-23 DIAGNOSIS — R1011 Right upper quadrant pain: Secondary | ICD-10-CM | POA: Insufficient documentation

## 2017-01-23 MED ORDER — TECHNETIUM TC 99M MEBROFENIN IV KIT
5.4600 | PACK | Freq: Once | INTRAVENOUS | Status: AC | PRN
  Administered 2017-01-23: 5.46 via INTRAVENOUS

## 2017-01-23 NOTE — Telephone Encounter (Signed)
lmtcb x2 for pt. 

## 2017-01-23 NOTE — Telephone Encounter (Signed)
Ok to rf? 

## 2017-01-24 NOTE — Progress Notes (Signed)
Office Visit Note  Patient: Carlyon Nolasco             Date of Birth: 10-09-1980           MRN: [id number]             PCP: Debbrah Alar, NP Referring: Debbrah Alar, NP Visit Date: 01/30/2017 Occupation: @GUAROCC @    Subjective:  Positive ANA, arthralgia.   History of Present Illness: Geanna Divirgilio is a 36 y.o. female with history of polyarthralgia and positive ANA. She states she continues to have pain in multiple joints. She describes pain in her neck or shoulders her hands and her knee joints. She denies any joint swelling. She also saw her gastroenterologist for elevated LFTs. She states the workup was negative she was diagnosed with fatty liver. She had a flare with nausea, diarrhea, extreme fatigue so she could not go to work. She has long-standing history of IBS.  Activities of Daily Living:  Patient reports morning stiffness for 10 minutes.   Patient Denies nocturnal pain.  Difficulty dressing/grooming: Denies Difficulty climbing stairs: Reports Difficulty getting out of chair: Denies Difficulty using hands for taps, buttons, cutlery, and/or writing: Denies   Review of Systems  Constitutional: Positive for fatigue. Negative for night sweats, weight gain, weight loss and weakness.  HENT: Negative for mouth sores, trouble swallowing, trouble swallowing, mouth dryness and nose dryness.   Eyes: Negative for pain, redness, visual disturbance and dryness.  Respiratory: Negative for cough, shortness of breath and difficulty breathing.   Cardiovascular: Positive for hypertension. Negative for chest pain, palpitations, irregular heartbeat and swelling in legs/feet.  Gastrointestinal: Negative for blood in stool, constipation and diarrhea.  Endocrine: Negative for increased urination.  Genitourinary: Negative for vaginal dryness.  Musculoskeletal: Positive for arthralgias and joint pain. Negative for joint swelling, myalgias, muscle weakness, morning stiffness,  muscle tenderness and myalgias.  Skin: Negative for color change, rash, hair loss, skin tightness, ulcers and sensitivity to sunlight.  Allergic/Immunologic: Negative for susceptible to infections.  Neurological: Negative for dizziness, memory loss and night sweats.  Hematological: Negative for swollen glands.  Psychiatric/Behavioral: Negative for depressed mood and sleep disturbance. The patient is not nervous/anxious.     PMFS History:  Patient Active Problem List   Diagnosis Date Noted  . Elevated LFTs 01/11/2017  . RUQ abdominal pain 01/11/2017  . PTSD (post-traumatic stress disorder) 12/28/2016  . Primary osteoarthritis of both knees 12/28/2016  . ANA positive 12/28/2016  . Leukocytosis 12/06/2016  . Fatty liver 11/28/2016  . History of kidney stones 10/21/2016  . Osteoarthritis 10/21/2016  . Borderline diabetes 10/20/2016  . Cough variant asthma vs uacs  07/07/2015  . Severe obesity (BMI >= 40) ([place]) 07/07/2015  . Dyspnea 07/07/2015  . Asthma 05/27/2015  . Aortic regurgitation due to bicuspid aortic valve 03/12/2014  . Personal history of tobacco use, presenting hazards to health 01/20/2014  . Anxiety state 10/16/2013  . Migraine 07/08/2013  . Sleep apnea 07/08/2013  . IBS (irritable bowel syndrome) 06/06/2013  . GERD (gastroesophageal reflux disease) 06/06/2013  . HTN (hypertension) 06/06/2013    Past Medical History:  Diagnosis Date  . Allergy   . Anxiety   . Aortic regurgitation due to bicuspid aortic valve   . Asthma   . Borderline diabetes 10/20/2016  . Chronic kidney disease    kidney stones  . Depression   . Fatty liver 11/28/2016  . GERD (gastroesophageal reflux disease)   . Heart murmur   . Hypertension   .  IBS (irritable bowel syndrome)   . Insomnia   . Migraine headache   . OCD (obsessive compulsive disorder)   . Orbital cellulitis    at age 74 was hospitalized for 3 months (almost died)   . OSA (obstructive sleep apnea)   . S/P appy 05/2013      Family History  Problem Relation Age of Onset  . Asthma Mother   . Hypertension Mother   . Breast cancer Father   . Diabetes Father   . Hypertension Father   . Heart disease Father   . Hyperlipidemia Father   . Asthma Sister   . Hyperlipidemia Brother   . Hypertension Brother   . Vision loss Brother   . Colon cancer Maternal Grandmother   . Liver cancer Maternal Grandmother   . Asthma Sister   . Cervical cancer Maternal Aunt   . Melanoma Maternal Uncle   . Esophageal cancer Neg Hx   . Pancreatic cancer Neg Hx   . Rectal cancer Neg Hx   . Stomach cancer Neg Hx    Past Surgical History:  Procedure Laterality Date  . DENTAL SURGERY     wisdom teeth  . LAPAROSCOPIC APPENDECTOMY N/A 06/06/2013   Procedure: APPENDECTOMY LAPAROSCOPIC;  Surgeon: Imogene Burn. Georgette Dover, MD;  Location: [facility];  Service: General;  Laterality: N/A;   Social History   Social History Narrative   Working at 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   Friends and family in [place]     Objective: Vital Signs: BP 126/80   Pulse 82   Resp 16   Ht 5' 5.5" (1.664 m)   Wt 250 lb (113.4 kg)   BMI 40.97 kg/m    Physical Exam  Constitutional: She is oriented to person, place, and time. She appears well-developed and well-nourished.  HENT:  Head: Normocephalic and atraumatic.  Eyes: Conjunctivae and EOM are normal.  Neck: Normal range of motion.  Cardiovascular: Normal rate, regular rhythm, normal heart sounds and intact distal pulses.   Pulmonary/Chest: Effort normal and breath sounds normal.  Abdominal: Soft. Bowel sounds are normal.  Lymphadenopathy:    She has no cervical adenopathy.  Neurological: She is alert and oriented to person, place, and time.  Skin: Skin is warm and dry. Capillary refill takes less than 2 seconds.  Psychiatric: She has a normal mood and affect. Her  behavior is normal.  Nursing note and vitals reviewed.    Musculoskeletal Exam: C-spine and thoracic lumbar spine good range of motion. Shoulder joints elbow joints wrist joint MCPs PIPs DIPs with good range of motion. Hip joints knee joints ankles MTPs PIPs with good range of motion. She has discomfort range of motion of her knee joints.  CDAI Exam: No CDAI exam completed.    Investigation: Findings:  6/6/ 18 ANA positive titer is 1:160 ,C3 and C4 normal, Sedimentation rate 3,Serum protein electrophoresis with reflex pattern consistent with an acute phase reaction,CK 91,ENA panel negative / normal,    Cyclic citrul peptide antibody, IgG normal, and Pan-ANCA Negative. LFTs elevated AST 131 ALT 127 Glucose (non fasting) elevated 135    CBC Latest Ref Rng & Units 12/06/2016 11/21/2016 10/17/2016  WBC 3.9 - 10.0 10e3/uL 12.0(H) 14.8(H) 13.3(H)  Hemoglobin 11.6 - 15.9 g/dL 12.6 13.3 13.8  Hematocrit 34.8 - 46.6 % 37.7 39.8 41.4  Platelets  145 - 400 10e3/uL 281 314.0 345.0   CMP Latest Ref Rng & Units 01/11/2017 12/28/2016 12/06/2016  Glucose 65 - 99 mg/dL - 135(H) 90  BUN 7 - 25 mg/dL - 12 10  Creatinine 0.50 - 1.10 mg/dL - 0.86 0.75  Sodium 135 - 146 mmol/L - 139 137  Potassium 3.5 - 5.3 mmol/L - 4.2 4.1  Chloride 98 - 110 mmol/L - 104 105  CO2 20 - 31 mmol/L - 23 25  Calcium 8.6 - 10.2 mg/dL - 8.6 9.3  Total Protein 6.0 - 8.3 g/dL 7.1 6.9 7.0  Total Bilirubin 0.2 - 1.2 mg/dL 0.3 0.3 0.2  Alkaline Phos 39 - 117 U/L 89 102 94  AST 0 - 37 U/L 83(H) 131(H) 68(H)  ALT 0 - 35 U/L 88(H) 127(H) 98(H)   Imaging: Nm Hepato W/eject Fract  Result Date: 01/23/2017 CLINICAL DATA:  Right upper quadrant abdominal pain. EXAM: NUCLEAR MEDICINE HEPATOBILIARY IMAGING WITH GALLBLADDER EF TECHNIQUE: Sequential images of the abdomen were obtained out to 60 minutes following intravenous administration of radiopharmaceutical. After oral ingestion of Ensure, gallbladder ejection fraction was determined. At 60  min, normal ejection fraction is greater than 33%. RADIOPHARMACEUTICALS:  5.28 mCi Tc-30m Choletec IV COMPARISON:  Right upper quadrant ultrasound 11/25/2016 FINDINGS: Prompt uptake and biliary excretion of activity by the liver is seen. Gallbladder activity is visualized, consistent with patency of cystic duct. Biliary activity passes into small bowel, consistent with patent common bile duct. Calculated gallbladder ejection fraction is 98%. (Normal gallbladder ejection fraction with Ensure is greater than 33%.) IMPRESSION: Normal hepatobiliary scintigraphy. Electronically Signed   By: DFidela SalisburyM.D.   On: 01/23/2017 13:11    Speciality Comments: No specialty comments available.    Procedures:  No procedures performed Allergies: Codeine; Adhesive [tape]; and Amlodipine   Assessment / Plan:     Visit Diagnoses: ANA positive - ANA1:160NH, ENA -, C3&C4 normal, arthralgia, hair loss. Patient has no synovitis on examination today she has no clinical features of lupus. She continues to have a lot of arthralgias and hand pain. I will schedule ultrasound of her bilateral hands to look for synovitis.  Elevated LFTs: She had GI evaluation which was negative.  Fatty liver  Primary osteoarthritis of both knees: She continues to have chronic pain and discomfort. Weight loss diet and exercise was discussed.  History of kidney stones  Other elevated white blood cell (WBC) count  Personal history of tobacco use, presenting hazards to health  Severe obesity (BMI >= 40) (HCC)  History of migraine  History of gastroesophageal reflux (GERD)  History of anxiety/ PTSD   History of borderline diabetes mellitus    Orders: No orders of the defined types were placed in this encounter.  No orders of the defined types were placed in this encounter.   Face-to-face time spent with patient was 377mutes. 50% of time was spent in counseling and coordination of care.  Follow-Up Instructions:  Return if symptoms worsen or fail to improve.   ShBo MerinoMD  Note - This record has been created using DrEditor, commissioning Chart creation errors have been sought, but may not always  have been located. Such creation errors do not reflect on  the standard of medical care.

## 2017-01-24 NOTE — Telephone Encounter (Signed)
LMOM x3 - per office protocol, will sign off on message Pt is active in mychart, so e-mail sent to patient with the information we would have provided had we been able to speak with her  Will sign off

## 2017-01-27 ENCOUNTER — Encounter: Payer: Self-pay | Admitting: Pulmonary Disease

## 2017-01-27 ENCOUNTER — Other Ambulatory Visit: Payer: Self-pay | Admitting: *Deleted

## 2017-01-27 ENCOUNTER — Telehealth: Payer: Self-pay | Admitting: Pulmonary Disease

## 2017-01-27 DIAGNOSIS — G4733 Obstructive sleep apnea (adult) (pediatric): Secondary | ICD-10-CM | POA: Diagnosis not present

## 2017-01-27 NOTE — Telephone Encounter (Signed)
Spoke with pt about results per VS. Pt understood and agreed to the order being placed for cpap. The order was placed and follow up appt was made. Pt had no further questions at this time. Nothing further is needed

## 2017-01-27 NOTE — Telephone Encounter (Signed)
HST 01/23/17 >> AHI 9.5, SaO2 low 78%.   Will have my nurse inform pt that sleep study shows mild sleep apnea.  Please send order for Resmed Airsense 10 auto CPAP with range 5 to 15 cm H2O with heated humidity and mask of choice.    Pt would like to use [facility] for her DME [phone]).  Please schedule ROV 2 months after set up with me or NP.

## 2017-01-30 ENCOUNTER — Encounter: Payer: Self-pay | Admitting: Rheumatology

## 2017-01-30 ENCOUNTER — Ambulatory Visit (INDEPENDENT_AMBULATORY_CARE_PROVIDER_SITE_OTHER): Payer: Managed Care, Other (non HMO) | Admitting: Rheumatology

## 2017-01-30 ENCOUNTER — Encounter: Payer: Self-pay | Admitting: Gastroenterology

## 2017-01-30 VITALS — BP 126/80 | HR 82 | Resp 16 | Ht 65.5 in | Wt 250.0 lb

## 2017-01-30 DIAGNOSIS — Z87891 Personal history of nicotine dependence: Secondary | ICD-10-CM

## 2017-01-30 DIAGNOSIS — Z8669 Personal history of other diseases of the nervous system and sense organs: Secondary | ICD-10-CM

## 2017-01-30 DIAGNOSIS — K760 Fatty (change of) liver, not elsewhere classified: Secondary | ICD-10-CM

## 2017-01-30 DIAGNOSIS — M170 Bilateral primary osteoarthritis of knee: Secondary | ICD-10-CM | POA: Diagnosis not present

## 2017-01-30 DIAGNOSIS — Z8659 Personal history of other mental and behavioral disorders: Secondary | ICD-10-CM | POA: Diagnosis not present

## 2017-01-30 DIAGNOSIS — D72828 Other elevated white blood cell count: Secondary | ICD-10-CM

## 2017-01-30 DIAGNOSIS — Z87442 Personal history of urinary calculi: Secondary | ICD-10-CM | POA: Diagnosis not present

## 2017-01-30 DIAGNOSIS — Z8719 Personal history of other diseases of the digestive system: Secondary | ICD-10-CM | POA: Diagnosis not present

## 2017-01-30 DIAGNOSIS — R7989 Other specified abnormal findings of blood chemistry: Secondary | ICD-10-CM

## 2017-01-30 DIAGNOSIS — R768 Other specified abnormal immunological findings in serum: Secondary | ICD-10-CM

## 2017-01-30 DIAGNOSIS — Z87898 Personal history of other specified conditions: Secondary | ICD-10-CM

## 2017-01-30 DIAGNOSIS — R945 Abnormal results of liver function studies: Secondary | ICD-10-CM

## 2017-02-02 ENCOUNTER — Institutional Professional Consult (permissible substitution): Payer: Managed Care, Other (non HMO) | Admitting: Pulmonary Disease

## 2017-02-06 ENCOUNTER — Encounter: Payer: Self-pay | Admitting: Genetic Counselor

## 2017-02-06 ENCOUNTER — Ambulatory Visit (AMBULATORY_SURGERY_CENTER): Payer: Self-pay

## 2017-02-06 ENCOUNTER — Ambulatory Visit (HOSPITAL_BASED_OUTPATIENT_CLINIC_OR_DEPARTMENT_OTHER): Payer: Managed Care, Other (non HMO) | Admitting: Genetic Counselor

## 2017-02-06 ENCOUNTER — Other Ambulatory Visit (HOSPITAL_BASED_OUTPATIENT_CLINIC_OR_DEPARTMENT_OTHER): Payer: Managed Care, Other (non HMO)

## 2017-02-06 VITALS — Ht 66.0 in | Wt 262.0 lb

## 2017-02-06 DIAGNOSIS — Z803 Family history of malignant neoplasm of breast: Secondary | ICD-10-CM

## 2017-02-06 DIAGNOSIS — K760 Fatty (change of) liver, not elsewhere classified: Secondary | ICD-10-CM | POA: Diagnosis not present

## 2017-02-06 DIAGNOSIS — D508 Other iron deficiency anemias: Secondary | ICD-10-CM

## 2017-02-06 DIAGNOSIS — Z800 Family history of malignant neoplasm of digestive organs: Secondary | ICD-10-CM | POA: Insufficient documentation

## 2017-02-06 DIAGNOSIS — R1011 Right upper quadrant pain: Secondary | ICD-10-CM

## 2017-02-06 DIAGNOSIS — D72829 Elevated white blood cell count, unspecified: Secondary | ICD-10-CM

## 2017-02-06 NOTE — Progress Notes (Signed)
REFERRING PROVIDER: Debbrah Alar, NP [address] [place], [place] [address]  PRIMARY PROVIDER:  Debbrah Alar, NP  PRIMARY REASON FOR VISIT:  1. Family history of breast cancer in female   2. Family history of colon cancer      HISTORY OF PRESENT ILLNESS:   Stephanie Miles, a 36 y.o. female, was seen for a [facility] cancer genetics consultation at the request of Dr. Inda Castle due to a family history of cancer.  Stephanie Miles presents to clinic today, with her mother, to discuss the possibility of a hereditary predisposition to cancer, genetic testing, and to further clarify her future cancer risks, as well as potential cancer risks for family members. Stephanie Miles is a 36 y.o. female with no personal history of cancer.  She had her first mammogram in April which found a lump that was biopsied.  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  CANCER HISTORY:   No history exists.     HORMONAL RISK FACTORS:  Menarche was at age 6.  First live birth at age 70.  OCP use for approximately 22 years.  Ovaries intact: yes.  Hysterectomy: no.  Menopausal status: premenopausal.  HRT use: 0 years. Colonoscopy: yes; normal. Mammogram within the last year: yes. Number of breast biopsies: 1. Up to date with pelvic exams:  yes. Any excessive radiation exposure in the past:  no  Past Medical History:  Diagnosis Date  . Allergy   . Anxiety   . Aortic regurgitation due to bicuspid aortic valve   . Asthma   . Borderline diabetes 10/20/2016  . Chronic kidney disease    kidney stones  . Depression   . Family history of breast cancer in female   . Family history of colon cancer   . Fatty liver 11/28/2016  . GERD (gastroesophageal reflux disease)   . Heart murmur   . Hypertension   . IBS (irritable bowel syndrome)   . Insomnia   . Migraine headache   . OCD (obsessive compulsive disorder)   . Orbital  cellulitis    at age 72 was hospitalized for 3 months (almost died)   . OSA (obstructive sleep apnea)   . S/P appy 05/2013    Past Surgical History:  Procedure Laterality Date  . DENTAL SURGERY     wisdom teeth  . LAPAROSCOPIC APPENDECTOMY N/A 06/06/2013   Procedure: APPENDECTOMY LAPAROSCOPIC;  Surgeon: Imogene Burn. Georgette Dover, MD;  Location: [facility];  Service: General;  Laterality: N/A;    Social History   Social History  . Marital status: Single    Spouse name: N/A  . Number of children: 0  . Years of education: N/A   Occupational History  . Student   . [employer]    Social History Main Topics  . Smoking status: Former Smoker    Packs/day: 1.00    Years: 20.00    Types: Cigarettes    Quit date: 02/22/2013  . Smokeless tobacco: Never Used  . Alcohol use 0.0 oz/week     Comment: social  . Drug use: No  . Sexual activity: Not Asked   Other Topics Concern  . None   Social History Narrative   Working at 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  Friends and family in [place]:  We obtained a detailed, 4-generation family history.  Significant diagnoses are listed below: Family History  Problem Relation Age of Onset  . Asthma Mother   . Hypertension Mother   . Breast cancer Father 63  . Diabetes Father   . Hypertension Father   . Heart disease Father   . Hyperlipidemia Father   . Asthma Sister   . Hyperlipidemia Brother   . Hypertension Brother   . Vision loss Brother   . Colon cancer Maternal Grandmother 73  . Liver cancer Maternal Grandmother   . Cervical cancer Maternal Aunt   . Stroke Paternal Uncle 9  . Melanoma Maternal Grandfather 74  . Colon polyps Maternal Grandfather   . AAA (abdominal aortic aneurysm) Paternal Grandmother   . Heart disease Paternal Grandfather   . Heart disease Paternal Uncle 39  . Colon cancer  Other 84       MGM's mother  . Stomach cancer Other 26       MGM's mother  . Breast cancer Other        MGM's maternal aunt  . Cancer Other        MGM's maternal uncle with GI cancer  . Esophageal cancer Neg Hx   . Pancreatic cancer Neg Hx   . Rectal cancer Neg Hx     The patient does not have children.  She has two younger siblings, a brother and sister, who are both cancer free.  Her father was diagnosed with breast cancer at age 27 and died at 71.  Her mother is alive and cancer free.  The patient's father has one sister and three brothers.  Two brothers died of heart disease, one at age 4.  None had cancer.  The patient's paternal grandmother died of an AAA, and her grandfather died of heart disease.  His sister had colon cancer and his mother had bilateral breast cancer at age 28 and colon cancer at age 62.  The patient's mother has two sisters, one who has had cervical cancer twice.  The patient's maternal grandmother had colon cancer at 38 and died at 59, and her grandfather had melanoma at 47.  Her grandmother was an only child, but her mother (patient's great grandmother) had colon cancer at 46 and gastric cancer at 58.  This great grandmother had a sister with beast cancer and a brother with a GI cancer.  Ms. [name] is unaware of previous family history of genetic testing for hereditary cancer risks. Patient's maternal ancestors are of [place] and [place] descent, and paternal ancestors are of [place] descent. There is no reported Ashkenazi Jewish ancestry. There is no known consanguinity.  GENETIC COUNSELING ASSESSMENT: Stephanie Miles is a 36 y.o. female with a family history of breast and colon cancer which is somewhat suggestive of a hereditary breast and ovarian cancer syndrome as well as Lynch syndrome and predisposition to cancer. We, therefore, discussed and recommended the following at today's visit.   DISCUSSION: We discussed that about 5-10% of breast cancer in women is  associated with hereditary mutations, however, in men it is thought that about 2/3 of men with breast cancer have a hereditary mutation. Most commonly this is due to BRCA mutations, but other genes, including PALB2 and CHEK2 have also been implicated in female breast cancer.  Based on the patient's father with breast cancer, her risk for having a hereditary mutation is about 33%.   The patient has  bi-lineal risk based on her mother's family history of young colon cancer.  Based on the GI cancers and young onset of colon cancer, there is a risk for Lynch syndrome on the maternal side.  The patient's mother is the best person to test for this.  Her mother attended the session here, but lives in [place], [place].  We referred her mother to the cancer center in [place] to learn about genetic testing.   We reviewed the characteristics, features and inheritance patterns of hereditary cancer syndromes. We also discussed genetic testing, including the appropriate family members to test, the process of testing, insurance coverage and turn-around-time for results. We discussed the implications of a negative, positive and/or variant of uncertain significant result. We recommended Ms. Vosler pursue genetic testing for the Common Hereditary Cancer gene panel. The Hereditary Gene Panel offered by [facility] includes sequencing and/or deletion duplication testing of the following 46 genes: APC, ATM, AXIN2, BARD1, BMPR1A, BRCA1, BRCA2, BRIP1, CDH1, CDKN2A (p14ARF), CDKN2A (p16INK4a), CHEK2, CTNNA1, DICER1, EPCAM (Deletion/duplication testing only), GREM1 (promoter region deletion/duplication testing only), KIT, MEN1, MLH1, MSH2, MSH3, MSH6, MUTYH, NBN, NF1, NHTL1, PALB2, PDGFRA, PMS2, POLD1, POLE, PTEN, RAD50, RAD51C, RAD51D, SDHB, SDHC, SDHD, SMAD4, SMARCA4. STK11, TP53, TSC1, TSC2, and VHL.  The following genes were evaluated for sequence changes only: SDHA and HOXB13 c.251G>A variant only.  Based on Ms. Mellette's family  history of cancer, she meets medical criteria for genetic testing. Despite that she meets criteria, she may still have an out of pocket cost. We discussed that if her out of pocket cost for testing is over $100, the laboratory will call and confirm whether she wants to proceed with testing.  If the out of pocket cost of testing is less than $100 she will be billed by the genetic testing laboratory.   In order to estimate her chance of having a BRCA mutation, we used statistical models (Tyrer Cusik) and laboratory data that take into account her personal medical history, family history and ancestry.  Because each model is different, there can be a lot of variability in the risks they give.  Therefore, these numbers must be considered a rough range and not a precise risk of having a BRCA mutation.  These models estimate that she has approximately a 1.23% chance of having a mutation.   Based on the patient's personal and family history, statistical models (Tyrer Cusik)  and literature data were used to estimate her risk of developing breast cancer. These estimate her lifetime risk of developing breast cancer to be approximately 24% to 29%. This estimation does not take into account any genetic testing results.  The patient's lifetime breast cancer risk is a preliminary estimate based on available information using one of several models endorsed by the [place] (ACS). The ACS recommends consideration of breast MRI screening as an adjunct to mammography for patients at high risk (defined as 20% or greater lifetime risk). A more detailed breast cancer risk assessment can be considered, if clinically indicated.     Ms. Tidd has been determined to be at high risk for breast cancer.  Therefore, we recommend that annual screening with mammography and breast MRI begin at age 58, or 10 years prior to the age of breast cancer diagnosis in a relative (whichever is earlier).  We discussed that Ms. Pavlovich  should discuss her individual situation with her referring physician and determine a breast cancer screening plan with which they are both comfortable.    PLAN: After considering  the risks, benefits, and limitations, Ms. Petrey  provided informed consent to pursue genetic testing and the blood sample was sent to [facility] for analysis of the Common Hereditary Cancer panel. Results should be available within approximately 2-3 weeks' time, at which point they will be disclosed by telephone to Ms. Duhon, as will any additional recommendations warranted by these results. Ms. Finks will receive a summary of her genetic counseling visit and a copy of her results once available. This information will also be available in Epic. We encouraged Ms. Stach to remain in contact with cancer genetics annually so that we can continuously update the family history and inform her of any changes in cancer genetics and testing that may be of benefit for her family. Ms. Budge questions were answered to her satisfaction today. Our contact information was provided should additional questions or concerns arise.  Based on Ms. Coley's family history, we recommended her mother, who has a mother who was diagnosed with colon cancer at age 16, have genetic counseling and testing. Ms. Burford will let us know if we can be of any assistance in coordinating genetic counseling and/or testing for this family member.   Lastly, we encouraged Ms. Brevik to remain in contact with cancer genetics annually so that we can continuously update the family history and inform her of any changes in cancer genetics and testing that may be of benefit for this family.   Ms.  Goucher questions were answered to her satisfaction today. Our contact information was provided should additional questions or concerns arise. Thank you for the referral and allowing [place] to share in the care of your patient.   Karen P. Florene Glen, [place],  [facility] Certified Genetic Counselor [email or web address] phone: [phone]  The patient was seen for a total of 60 minutes in face-to-face genetic counseling.  This patient was discussed with Drs. Magrinat, Lindi Adie and/or Burr Medico who agrees with the above.    _______________________________________________________________________ For Office Staff:  Number of people involved in session: 2 Was an Intern/ student involved with case: no

## 2017-02-06 NOTE — Progress Notes (Signed)
Denies allergies to eggs or soy products. Denies complication of anesthesia or sedation. Denies use of weight loss medication. Denies use of O2.   Emmi instructions given for colonoscopy.  

## 2017-02-07 LAB — IRON AND TIBC
%SAT: 12 % — ABNORMAL LOW (ref 21–57)
IRON: 44 ug/dL (ref 41–142)
TIBC: 361 ug/dL (ref 236–444)
UIBC: 317 ug/dL (ref 120–384)

## 2017-02-08 ENCOUNTER — Telehealth: Payer: Self-pay | Admitting: *Deleted

## 2017-02-08 ENCOUNTER — Other Ambulatory Visit: Payer: Self-pay | Admitting: Family

## 2017-02-08 ENCOUNTER — Encounter: Payer: Self-pay | Admitting: Gastroenterology

## 2017-02-08 NOTE — Telephone Encounter (Addendum)
Patient is aware of results and will start supplement  ----- Message from Eliezer Bottom, NP sent at 02/08/2017  9:39 AM EDT ----- Regarding: Iron  Iron saturation a little low. She can try taking an OTC iron supplement and we will recheck when she comes in August. Thank you!  Stephanie Miles  ----- Message ----- From: Interface, Lab In Three Zero One Sent: 02/07/2017   9:06 AM To: Eliezer Bottom, NP

## 2017-02-10 ENCOUNTER — Encounter: Payer: Self-pay | Admitting: Family

## 2017-02-14 ENCOUNTER — Encounter: Payer: Self-pay | Admitting: Gastroenterology

## 2017-02-14 ENCOUNTER — Ambulatory Visit (AMBULATORY_SURGERY_CENTER): Payer: Managed Care, Other (non HMO) | Admitting: Gastroenterology

## 2017-02-14 VITALS — BP 131/71 | HR 63 | Temp 98.2°F | Resp 15 | Wt 262.0 lb

## 2017-02-14 DIAGNOSIS — K219 Gastro-esophageal reflux disease without esophagitis: Secondary | ICD-10-CM

## 2017-02-14 DIAGNOSIS — R1011 Right upper quadrant pain: Secondary | ICD-10-CM

## 2017-02-14 DIAGNOSIS — K2270 Barrett's esophagus without dysplasia: Secondary | ICD-10-CM | POA: Diagnosis not present

## 2017-02-14 MED ORDER — SODIUM CHLORIDE 0.9 % IV SOLN: 500.0000 mL | INTRAVENOUS | Status: AC

## 2017-02-14 NOTE — Op Note (Signed)
[facility] Patient Name: Stephanie Miles Procedure Date: 02/14/2017 10:34 AM MRN: [id number] Endoscopist: Ladene Artist , MD Age: 36 Referring MD:  Date of Birth: 12/16/1980 Gender: Female Account #: [id number] Procedure:                Upper GI endoscopy Indications:              Abdominal pain in the right upper quadrant,                            Gastro-esophageal reflux disease Medicines:                Monitored Anesthesia Care Procedure:                Pre-Anesthesia Assessment:                           - Prior to the procedure, a History and Physical                            was performed, and patient medications and                            allergies were reviewed. The patient's tolerance of                            previous anesthesia was also reviewed. The risks                            and benefits of the procedure and the sedation                            options and risks were discussed with the patient.                            All questions were answered, and informed consent                            was obtained. Prior Anticoagulants: The patient has                            taken no previous anticoagulant or antiplatelet                            agents. ASA Grade Assessment: II - A patient with                            mild systemic disease. After reviewing the risks                            and benefits, the patient was deemed in                            satisfactory condition to undergo the procedure.  After obtaining informed consent, the endoscope was                            passed under direct vision. Throughout the                            procedure, the patient's blood pressure, pulse, and                            oxygen saturations were monitored continuously. The                            Model GIF-[id number]) scope was introduced                            through the mouth, and  advanced to the second part                            of duodenum. The upper GI endoscopy was                            accomplished without difficulty. The patient                            tolerated the procedure well. Scope In: Scope Out: Findings:                 There were esophageal mucosal changes suspicious                            for long-segment Barrett's esophagus present in the                            distal esophagus. The maximum longitudinal extent                            of these mucosal changes was 4 cm in length. Mucosa                            was biopsied with a cold forceps for histology in 4                            quadrants at intervals of 1 cm in the lower third                            of the esophagus. One specimen bottle was sent to                            pathology.                           The exam of the esophagus was otherwise normal.  A small hiatal hernia was present.                           The exam of the stomach was otherwise normal.                           The duodenal bulb and second portion of the                            duodenum were normal. Complications:            No immediate complications. Estimated Blood Loss:     Estimated blood loss was minimal. Impression:               - Esophageal mucosal changes suspicious for                            long-segment Barrett's esophagus. Biopsied.                           - Small hiatal hernia.                           - Normal duodenal bulb and second portion of the                            duodenum. Recommendation:           - Patient has a contact number available for                            emergencies. The signs and symptoms of potential                            delayed complications were discussed with the                            patient. Return to normal activities tomorrow.                            Written discharge instructions  were provided to the                            patient.                           - Resume previous diet. Follow antireflux measures.                           - Continue present medications.                           - Await pathology results.                           - Repeat upper endoscopy as indicated for  surveillance based on pathology results. Ladene Artist, MD 02/14/2017 10:52:46 AM This report has been signed electronically.

## 2017-02-14 NOTE — Progress Notes (Signed)
Report given to PACU, vss 

## 2017-02-14 NOTE — Progress Notes (Signed)
Called to room to assist during endoscopic procedure.  Patient ID and intended procedure confirmed with present staff. Received instructions for my participation in the procedure from the performing physician.  

## 2017-02-14 NOTE — Patient Instructions (Signed)
**  Handouts given on Barrett's Esophagus and Hiatal Hernia**   YOU HAD AN ENDOSCOPIC PROCEDURE TODAY: Refer to the procedure report and other information in the discharge instructions given to you for any specific questions about what was found during the examination. If this information does not answer your questions, please call [facility] office at [phone] to clarify.   YOU SHOULD EXPECT: Some feelings of bloating in the abdomen. Passage of more gas than usual. Walking can help get rid of the air that was put into your GI tract during the procedure and reduce the bloating. If you had a lower endoscopy (such as a colonoscopy or flexible sigmoidoscopy) you may notice spotting of blood in your stool or on the toilet paper. Some abdominal soreness may be present for a day or two, also.  DIET: Your first meal following the procedure should be a light meal and then it is ok to progress to your normal diet. A half-sandwich or bowl of soup is an example of a good first meal. Heavy or fried foods are harder to digest and may make you feel nauseous or bloated. Drink plenty of fluids but you should avoid alcoholic beverages for 24 hours. If you had a esophageal dilation, please see attached instructions for diet.    ACTIVITY: Your care partner should take you home directly after the procedure. You should plan to take it easy, moving slowly for the rest of the day. You can resume normal activity the day after the procedure however YOU SHOULD NOT DRIVE, use power tools, machinery or perform tasks that involve climbing or major physical exertion for 24 hours (because of the sedation medicines used during the test).   SYMPTOMS TO REPORT IMMEDIATELY: A gastroenterologist can be reached at any hour. Please call [phone]  for any of the following symptoms:   Following upper endoscopy (EGD, EUS, ERCP, esophageal dilation) Vomiting of blood or coffee ground material  New, significant abdominal pain  New,  significant chest pain or pain under the shoulder blades  Painful or persistently difficult swallowing  New shortness of breath  Black, tarry-looking or red, bloody stools  FOLLOW UP:  If any biopsies were taken you will be contacted by phone or by letter within the next 1-3 weeks. Call [phone]  if you have not heard about the biopsies in 3 weeks.  Please also call with any specific questions about appointments or follow up tests.

## 2017-02-15 ENCOUNTER — Ambulatory Visit (INDEPENDENT_AMBULATORY_CARE_PROVIDER_SITE_OTHER): Payer: Managed Care, Other (non HMO) | Admitting: Family

## 2017-02-15 ENCOUNTER — Encounter: Payer: Self-pay | Admitting: Family

## 2017-02-15 ENCOUNTER — Telehealth: Payer: Self-pay

## 2017-02-15 ENCOUNTER — Telehealth: Payer: Self-pay | Admitting: Behavioral Health

## 2017-02-15 ENCOUNTER — Ambulatory Visit (HOSPITAL_BASED_OUTPATIENT_CLINIC_OR_DEPARTMENT_OTHER)
Admission: RE | Admit: 2017-02-15 | Discharge: 2017-02-15 | Disposition: A | Discharge status: Home / Self Care | Payer: Managed Care, Other (non HMO) | Source: Ambulatory Visit | Attending: Family | Admitting: Family

## 2017-02-15 VITALS — BP 130/68 | HR 88 | Temp 98.4°F | Resp 16 | Ht 65.5 in | Wt 260.6 lb

## 2017-02-15 DIAGNOSIS — R05 Cough: Secondary | ICD-10-CM

## 2017-02-15 DIAGNOSIS — R110 Nausea: Secondary | ICD-10-CM | POA: Diagnosis not present

## 2017-02-15 DIAGNOSIS — R5383 Other fatigue: Secondary | ICD-10-CM | POA: Diagnosis present

## 2017-02-15 DIAGNOSIS — R7989 Other specified abnormal findings of blood chemistry: Secondary | ICD-10-CM

## 2017-02-15 DIAGNOSIS — R059 Cough, unspecified: Secondary | ICD-10-CM

## 2017-02-15 DIAGNOSIS — M2550 Pain in unspecified joint: Secondary | ICD-10-CM

## 2017-02-15 DIAGNOSIS — R918 Other nonspecific abnormal finding of lung field: Secondary | ICD-10-CM | POA: Diagnosis not present

## 2017-02-15 DIAGNOSIS — R945 Abnormal results of liver function studies: Secondary | ICD-10-CM

## 2017-02-15 LAB — HEPATIC FUNCTION PANEL
ALBUMIN: 3.7 g/dL (ref 3.5–5.2)
ALT: 100 U/L — AB (ref 0–35)
AST: 100 U/L — AB (ref 0–37)
Alkaline Phosphatase: 85 U/L (ref 39–117)
Bilirubin, Direct: 0 mg/dL (ref 0.0–0.3)
Total Bilirubin: 0.2 mg/dL (ref 0.2–1.2)
Total Protein: 7.2 g/dL (ref 6.0–8.3)

## 2017-02-15 LAB — URINALYSIS, ROUTINE W REFLEX MICROSCOPIC
Bilirubin Urine: NEGATIVE
Hgb urine dipstick: NEGATIVE
KETONES UR: NEGATIVE
Leukocytes, UA: NEGATIVE
Nitrite: NEGATIVE
PH: 6 (ref 5.0–8.0)
RBC / HPF: NONE SEEN (ref 0–?)
SPECIFIC GRAVITY, URINE: 1.02 (ref 1.000–1.030)
Total Protein, Urine: NEGATIVE
URINE GLUCOSE: NEGATIVE
Urobilinogen, UA: 0.2 (ref 0.0–1.0)
WBC UA: NONE SEEN (ref 0–?)

## 2017-02-15 LAB — CBC WITH DIFFERENTIAL/PLATELET
Basophils Absolute: 0.1 10*3/uL (ref 0.0–0.1)
Basophils Relative: 1 % (ref 0.0–3.0)
EOS PCT: 3.5 % (ref 0.0–5.0)
Eosinophils Absolute: 0.4 10*3/uL (ref 0.0–0.7)
HCT: 37.2 % (ref 36.0–46.0)
HEMOGLOBIN: 12.3 g/dL (ref 12.0–15.0)
Lymphocytes Relative: 36.6 % (ref 12.0–46.0)
Lymphs Abs: 3.8 10*3/uL (ref 0.7–4.0)
MCHC: 33.1 g/dL (ref 30.0–36.0)
MCV: 84.8 fl (ref 78.0–100.0)
MONOS PCT: 5.2 % (ref 3.0–12.0)
Monocytes Absolute: 0.5 10*3/uL (ref 0.1–1.0)
Neutro Abs: 5.6 10*3/uL (ref 1.4–7.7)
Neutrophils Relative %: 53.7 % (ref 43.0–77.0)
Platelets: 258 10*3/uL (ref 150.0–400.0)
RBC: 4.38 Mil/uL (ref 3.87–5.11)
RDW: 14.4 % (ref 11.5–15.5)
WBC: 10.5 10*3/uL (ref 4.0–10.5)

## 2017-02-15 LAB — BASIC METABOLIC PANEL
BUN: 12 mg/dL (ref 6–23)
CO2: 28 mEq/L (ref 19–32)
Calcium: 9.6 mg/dL (ref 8.4–10.5)
Chloride: 103 mEq/L (ref 96–112)
Creatinine, Ser: 0.96 mg/dL (ref 0.40–1.20)
GFR: 69.07 mL/min (ref >60.00)
Glucose, Bld: 122 mg/dL — ABNORMAL HIGH (ref 70–99)
POTASSIUM: 4.1 meq/L (ref 3.5–5.1)
SODIUM: 136 meq/L (ref 135–145)

## 2017-02-15 LAB — LIPASE: Lipase: 27 U/L (ref 11.0–59.0)

## 2017-02-15 LAB — TSH: TSH: 2.37 u[IU]/mL (ref 0.35–4.50)

## 2017-02-15 MED ORDER — ONDANSETRON HCL 4 MG/2ML IJ SOLN
4.0000 mg | Freq: Once | INTRAMUSCULAR | Status: AC
  Administered 2017-02-15: 4 mg via INTRAMUSCULAR

## 2017-02-15 NOTE — Telephone Encounter (Signed)
Noted  

## 2017-02-15 NOTE — Patient Instructions (Signed)
Please complete lab work prior to leaving. Complete chest x-ray the first. For nausea you may use Zofran as needed. Please go to the emergency department if you are unable to keep down food or liquid after dose of Zofran. He should also go to the emergency department if you develop fever greater than 101, or worsening abdominal pain.

## 2017-02-15 NOTE — Progress Notes (Signed)
Subjective:    Patient ID: Stephanie Miles, female    DOB: 07-06-1981, 36 y.o.   MRN: [id number]  HPI  Mrs. Phariss is a 36 year old female who presents today with multiple complaints. Reports + wheezing this AM. Feels "like I am on fire."  Reports + fatigue (chronic).  + HA.  She reports acute on chronic nausea. She has not attempted to eat/drink yet today because she is afraid she will vomit. She reports mild dry cough.  Denies dysuria/frequency.  She is currently being followed by gastroenterology, Dr. Fuller Plan. She underwent an endoscopy on July 24. Endoscopy findings were suspicious for Barrett's esophagus in the distal esophagus. Note was also made of small hiatal hernia.  She also recently met with rheumatology- Dr. Estanislado Pandy on 01/30/2017. It was felt that she did not have any clinical features of lupus.  Elevated liver function testing-she has chronically elevated AST and ALTs. Ultrasound of the liver performed July 2017 noted moderate to severe diffuse hepatic steatosis.  Review of Systems See HPI  Past Medical History:  Diagnosis Date  . Allergy   . Anxiety   . Aortic regurgitation due to bicuspid aortic valve   . Arthritis   . Asthma    Patient denies  . Borderline diabetes 10/20/2016  . Chronic kidney disease    kidney stones  . Depression   . Family history of breast cancer in female   . Family history of colon cancer   . Fatty liver 11/28/2016  . GERD (gastroesophageal reflux disease)   . Heart murmur   . Hypertension   . IBS (irritable bowel syndrome)   . Insomnia   . Migraine headache   . OCD (obsessive compulsive disorder)   . Orbital cellulitis    at age 65 was hospitalized for 3 months (almost died)   . OSA (obstructive sleep apnea)   . S/P appy 05/2013  . Sleep apnea      Social History   Social History  . Marital status: Single    Spouse name: N/A  . Number of children: 0  . Years of education: N/A   Occupational History  . Student   . Medical  Sales    Social History Main Topics  . Smoking status: Former Smoker    Packs/day: 1.00    Years: 20.00    Types: Cigarettes    Quit date: 02/22/2013  . Smokeless tobacco: Never Used  . Alcohol use 0.0 oz/week     Comment: social  . Drug use: No  . Sexual activity: Not on file   Other Topics Concern  . Not on file   Social History Narrative   Working at 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   Friends and family in [place]    Past Surgical History:  Procedure Laterality Date  . COLONOSCOPY    . DENTAL SURGERY     wisdom teeth  . LAPAROSCOPIC APPENDECTOMY N/A 06/06/2013   Procedure: APPENDECTOMY LAPAROSCOPIC;  Surgeon: Imogene Burn. Georgette Dover, MD;  Location: [facility];  Service: General;  Laterality: N/A;    Family History  Problem Relation Age of Onset  . Asthma Mother   . Hypertension Mother   . Breast cancer Father 22  . Diabetes Father   . Hypertension Father   . Heart disease Father   . Hyperlipidemia Father   .  Asthma Sister   . Hyperlipidemia Brother   . Hypertension Brother   . Vision loss Brother   . Colon cancer Maternal Grandmother 34  . Liver cancer Maternal Grandmother   . Cervical cancer Maternal Aunt   . Stroke Paternal Uncle 89  . Melanoma Maternal Grandfather 28  . Colon polyps Maternal Grandfather   . AAA (abdominal aortic aneurysm) Paternal Grandmother   . Heart disease Paternal Grandfather   . Heart disease Paternal Uncle 22  . Colon cancer Other 74       MGM's mother  . Stomach cancer Other 74       MGM's mother  . Breast cancer Other        MGM's maternal aunt  . Cancer Other        MGM's maternal uncle with GI cancer  . Esophageal cancer Neg Hx   . Pancreatic cancer Neg Hx   . Rectal cancer Neg Hx     Allergies  Allergen Reactions  . Codeine Nausea Only    Needs antiemetic  . Adhesive [Tape]     Tapes, bandaids  .  Amlodipine Other (See Comments)    EDEMA    Current Outpatient Prescriptions on File Prior to Visit  Medication Sig Dispense Refill  . ACCU-CHEK SOFTCLIX LANCETS lancets Use as instructed 100 each 1  . Blood Glucose Monitoring Suppl (ACCU-CHEK GUIDE) w/Device KIT 1 each by Does not apply route daily. 1 kit 0  . clotrimazole-betamethasone (LOTRISONE) cream Apply 1 application topically 2 (two) times daily. 30 g 1  . dicyclomine (BENTYL) 20 MG tablet TAKE 1 TABLET BY MOUTH 4 TIMES DAILY BEFORE MEALS AND AT BEDTIME 120 tablet 5  . escitalopram (LEXAPRO) 10 MG tablet Take 1 tablet (10 mg total) by mouth daily. 90 tablet 1  . fluticasone (FLONASE) 50 MCG/ACT nasal spray Place 2 sprays into both nostrils daily.    . furosemide (LASIX) 40 MG tablet Take 1 tablet (40 mg total) by mouth every morning. 30 tablet 4  . glucose blood (ACCU-CHEK GUIDE) test strip Use as instructed to check blood sugar once a day. 100 each 1  . losartan (COZAAR) 100 MG tablet Take 1 tablet (100 mg total) by mouth daily. 30 tablet 4  . meloxicam (MOBIC) 15 MG tablet TAKE ONE TABLET BY MOUTH DAILY FOR 2 WEEKS THEN AS NEEDED TAKE WITH FOOD 30 tablet 0  . metoprolol succinate (TOPROL-XL) 50 MG 24 hr tablet Take 1 tablet (50 mg total) by mouth daily. Take with or immediately following a meal. 30 tablet 3  . mirtazapine (REMERON) 15 MG tablet Take 1 tablet (15 mg total) by mouth at bedtime. 30 tablet 5  . norgestimate-ethinyl estradiol (ORTHO-CYCLEN,SPRINTEC,PREVIFEM) 0.25-35 MG-MCG tablet Take 1 tablet by mouth every evening. 1 Package 11  . omeprazole (PRILOSEC) 20 MG capsule Take 70m before breakfast and 258mbefore supper. 90 capsule 5  . Pancrelipase, Lip-Prot-Amyl, (CREON) 24000-76000 units CPEP Take 3 capsules (72,000 Units total) by mouth 3 (three) times daily before meals. 270 capsule 2  . potassium chloride (K-DUR,KLOR-CON) 10 MEQ tablet Take 1 tablet (10 mEq total) by mouth every other day. 15 tablet 5  . SUMAtriptan  (IMITREX) 50 MG tablet Take 1 tablet (50 mg total) by mouth every 2 (two) hours as needed for migraine. May repeat in 2 hours if headache persists or recurs. 10 tablet 0  . traZODone (DESYREL) 50 MG tablet Take 1-2 tablets (50-100 mg total) by mouth at bedtime. 60 tablet 5  Current Facility-Administered Medications on File Prior to Visit  Medication Dose Route Frequency Provider Last Rate Last Dose  . 0.9 %  sodium chloride infusion  500 mL Intravenous Continuous Ladene Artist, MD        BP 130/68 (BP Location: Right Arm, Cuff Size: Normal)   Pulse 88   Temp 98.4 F (36.9 C) (Oral)   Resp 16   Ht 5' 5.5" (1.664 m)   Wt 260 lb 9.6 oz (118.2 kg)   SpO2 97%   BMI 42.71 kg/m       Objective:   Physical Exam  Constitutional: She is oriented to person, place, and time. She appears well-developed and well-nourished.  HENT:  Head: Normocephalic.  Cardiovascular: Normal rate, regular rhythm and normal heart sounds.   No murmur heard. Pulmonary/Chest: Effort normal and breath sounds normal. No respiratory distress. She has no wheezes.  Abdominal: Soft.  Mild generalized abdominal tenderness, slightly more tender in the right upper quadrant area.  Musculoskeletal: She exhibits no edema.  Neurological: She is alert and oriented to person, place, and time.  Psychiatric: She has a normal mood and affect. Her behavior is normal. Judgment and thought content normal.          Assessment & Plan:  Abdominal pain- ultrasound the abdomen which was performed in May was unremarkable other than fatty liver.  She underwent a HIDA scan on 01/23/2017. She was noted to have a normal gallbladder ejection fraction. I did review this case with her gastroenterologist. We discussed the possibility of autoimmune hepatitis. He has arranged for her to have a liver biopsy for further evaluation. She is given a prescription for Zofran to use as needed for nausea.  Cough- chest x-ray is clear  today.  Fatigue- thyroid testing is normal. White count is normal.  Arthralgia-  suspect arthralgia is primarily due to osteoarthritis. Will ask patient to return to the lab for [facility] and [facility] spotted fever testing.

## 2017-02-15 NOTE — Telephone Encounter (Signed)
Patient presents in office this morning with the complaint of nausea. RN brought patient back to the treatment room for further evaluation. Patient reported that around 7 AM today she woke up with a severe headache, nausea, abdominal pain, shortness of breath & hot flashes; she stated, "I feel like I'm on fire". Patient denies chest pain, numbness & tingling in limbs & dizziness; however a little foggy. She reports her headache pain level to be 7/10 (0-10 pain scale) & 5/10 for abdominal pain. Patient voiced that she always has abdominal pain, but today it's more than usual. She said that on "yesterday she had an EGD & everything went well". RN obtained the following vitals: 98.3 T, 119/75 BP, 77 P, 96% O2. She has not taken any medications for the day. Offered & scheduled patient an appointment to be seen with PCP at 10:45 AM today. Advised patient that if her symptoms worsen or condition changes prior to the appointment, please call the office or seek the nearest Emergency Department. She verbalized understanding and did not address any additional concerns. Message routed to Debbrah Alar, NP for review.

## 2017-02-15 NOTE — Telephone Encounter (Signed)
  Follow up Call-  Call back number 02/14/2017  Post procedure Call Back phone  # [phone]  Permission to leave phone message Yes  Some recent data might be hidden     Patient questions:  Do you have a fever, pain , or abdominal swelling? No. Pain Score  0 *  Have you tolerated food without any problems? Yes.    Have you been able to return to your normal activities? Yes.    Do you have any questions about your discharge instructions: Diet   No. Medications  No. Follow up visit  No.  Do you have questions or concerns about your Care? No.  Actions: * If pain score is 4 or above: No action needed, pain <4.

## 2017-02-16 ENCOUNTER — Telehealth: Payer: Self-pay

## 2017-02-16 ENCOUNTER — Telehealth: Payer: Self-pay | Admitting: Family

## 2017-02-16 DIAGNOSIS — M2550 Pain in unspecified joint: Secondary | ICD-10-CM

## 2017-02-16 DIAGNOSIS — R110 Nausea: Secondary | ICD-10-CM

## 2017-02-16 DIAGNOSIS — R1011 Right upper quadrant pain: Secondary | ICD-10-CM

## 2017-02-16 DIAGNOSIS — R7401 Elevation of levels of liver transaminase levels: Secondary | ICD-10-CM

## 2017-02-16 DIAGNOSIS — R740 Nonspecific elevation of levels of transaminase and lactic acid dehydrogenase [LDH]: Principal | ICD-10-CM

## 2017-02-16 LAB — URINE CULTURE

## 2017-02-16 NOTE — Telephone Encounter (Signed)
Left message for patient to call back  

## 2017-02-16 NOTE — Telephone Encounter (Signed)
I spoke with [name].  I explained Dr. Lynne Leader response and recommendations. All questions about biopsy answered. She consents to proceed with liver biopsy. She is aware that she will be contacted directly by scheduling to arrange the appt in the next few days. Marland Kitchen

## 2017-02-16 NOTE — Telephone Encounter (Signed)
-----   Message from Ladene Artist, MD sent at 02/16/2017 12:19 PM EDT ----- Stephanie Miles,  It is possible she has AIH although I do not think she does because her anti-smooth muscle Ab was negative and her gamma globins were normal. Both are typically elevated in AIH.  RUQ pain is not typical for mild hepatitis and we have not found a GI cause for her RUQ pain.  A liver biopsy is the only way to know for sure. We will contact her to discuss the option of liver biopsy.   Thanks,   MS  ----- Message ----- From: Debbrah Alar, NP Sent: 02/15/2017   8:57 PM To: Ladene Artist, MD  Dear Dr. Fuller Plan,  Thank you for your help with [name]. I saw her back in the office today and she complained of severe nausea, and ongoing abdominal pain. Her abdominal pain was somewhat diffuse however it was worse in the right upper quadrant area. I know she has discussed this with you in the past. Her liver function tests remain elevated. She does have a positive ANA. What ar your thoughts about possibility of autoimmune hepatitis?  Thanks,  Debbrah Alar NP

## 2017-02-16 NOTE — Telephone Encounter (Signed)
See my chart message

## 2017-02-16 NOTE — Telephone Encounter (Signed)
Patient has been scheduled for 03/02/17 at [facility]

## 2017-02-17 ENCOUNTER — Other Ambulatory Visit (INDEPENDENT_AMBULATORY_CARE_PROVIDER_SITE_OTHER): Payer: Managed Care, Other (non HMO)

## 2017-02-17 ENCOUNTER — Other Ambulatory Visit: Payer: Self-pay | Admitting: Family

## 2017-02-17 DIAGNOSIS — M2550 Pain in unspecified joint: Secondary | ICD-10-CM

## 2017-02-20 ENCOUNTER — Encounter: Payer: Self-pay | Admitting: Gastroenterology

## 2017-02-20 LAB — ROCKY MTN SPOTTED FVR ABS PNL(IGG+IGM)
RMSF IGG: NOT DETECTED
RMSF IGM: NOT DETECTED

## 2017-02-20 LAB — LYME AB/WESTERN BLOT REFLEX: B burgdorferi Ab IgG+IgM: <0.9 Index (ref <0.90)

## 2017-02-21 ENCOUNTER — Encounter (HOSPITAL_BASED_OUTPATIENT_CLINIC_OR_DEPARTMENT_OTHER): Payer: Self-pay | Admitting: Emergency Medicine

## 2017-02-21 ENCOUNTER — Emergency Department (HOSPITAL_BASED_OUTPATIENT_CLINIC_OR_DEPARTMENT_OTHER)
Admission: EM | Admit: 2017-02-21 | Discharge: 2017-02-21 | Disposition: A | Discharge status: Home / Self Care | Payer: Managed Care, Other (non HMO) | Attending: Emergency Medicine | Admitting: Emergency Medicine

## 2017-02-21 ENCOUNTER — Telehealth: Payer: Self-pay

## 2017-02-21 DIAGNOSIS — R51 Headache: Secondary | ICD-10-CM | POA: Diagnosis not present

## 2017-02-21 DIAGNOSIS — Z79899 Other long term (current) drug therapy: Secondary | ICD-10-CM | POA: Diagnosis not present

## 2017-02-21 DIAGNOSIS — R062 Wheezing: Secondary | ICD-10-CM | POA: Insufficient documentation

## 2017-02-21 DIAGNOSIS — R5383 Other fatigue: Secondary | ICD-10-CM | POA: Insufficient documentation

## 2017-02-21 DIAGNOSIS — N189 Chronic kidney disease, unspecified: Secondary | ICD-10-CM | POA: Diagnosis not present

## 2017-02-21 DIAGNOSIS — I129 Hypertensive chronic kidney disease with stage 1 through stage 4 chronic kidney disease, or unspecified chronic kidney disease: Secondary | ICD-10-CM | POA: Insufficient documentation

## 2017-02-21 DIAGNOSIS — R52 Pain, unspecified: Secondary | ICD-10-CM

## 2017-02-21 DIAGNOSIS — Z87891 Personal history of nicotine dependence: Secondary | ICD-10-CM | POA: Diagnosis not present

## 2017-02-21 DIAGNOSIS — J45909 Unspecified asthma, uncomplicated: Secondary | ICD-10-CM | POA: Insufficient documentation

## 2017-02-21 LAB — URINALYSIS, MICROSCOPIC (REFLEX): RBC / HPF: NONE SEEN RBC/hpf (ref 0–5)

## 2017-02-21 LAB — COMPREHENSIVE METABOLIC PANEL
ALBUMIN: 3.4 g/dL — AB (ref 3.5–5.0)
ALT: 91 U/L — ABNORMAL HIGH (ref 14–54)
ANION GAP: 8 (ref 5–15)
AST: 79 U/L — AB (ref 15–41)
Alkaline Phosphatase: 88 U/L (ref 38–126)
BUN: 10 mg/dL (ref 6–20)
CHLORIDE: 103 mmol/L (ref 101–111)
CO2: 26 mmol/L (ref 22–32)
Calcium: 8.9 mg/dL (ref 8.9–10.3)
Creatinine, Ser: 0.68 mg/dL (ref 0.44–1.00)
GFR calc Af Amer: >60 mL/min (ref >60)
GFR calc non Af Amer: >60 mL/min (ref >60)
GLUCOSE: 110 mg/dL — AB (ref 65–99)
POTASSIUM: 4 mmol/L (ref 3.5–5.1)
SODIUM: 137 mmol/L (ref 135–145)
Total Bilirubin: 0.2 mg/dL — ABNORMAL LOW (ref 0.3–1.2)
Total Protein: 6.9 g/dL (ref 6.5–8.1)

## 2017-02-21 LAB — LIPASE, BLOOD: Lipase: 26 U/L (ref 11–51)

## 2017-02-21 LAB — CBC WITH DIFFERENTIAL/PLATELET
BASOS ABS: 0.1 10*3/uL (ref 0.0–0.1)
BASOS PCT: 1 %
Eosinophils Absolute: 0.6 10*3/uL (ref 0.0–0.7)
Eosinophils Relative: 4 %
HEMATOCRIT: 35.7 % — AB (ref 36.0–46.0)
HEMOGLOBIN: 12 g/dL (ref 12.0–15.0)
LYMPHS PCT: 32 %
Lymphs Abs: 4.5 10*3/uL — ABNORMAL HIGH (ref 0.7–4.0)
MCH: 28.6 pg (ref 26.0–34.0)
MCHC: 33.6 g/dL (ref 30.0–36.0)
MCV: 85 fL (ref 78.0–100.0)
MONO ABS: 0.8 10*3/uL (ref 0.1–1.0)
MONOS PCT: 6 %
NEUTROS ABS: 8 10*3/uL — AB (ref 1.7–7.7)
NEUTROS PCT: 57 %
Platelets: 223 10*3/uL (ref 150–400)
RBC: 4.2 MIL/uL (ref 3.87–5.11)
RDW: 14 % (ref 11.5–15.5)
WBC: 13.9 10*3/uL — ABNORMAL HIGH (ref 4.0–10.5)

## 2017-02-21 LAB — URINALYSIS, ROUTINE W REFLEX MICROSCOPIC
Bilirubin Urine: NEGATIVE
Glucose, UA: NEGATIVE mg/dL
HGB URINE DIPSTICK: NEGATIVE
Ketones, ur: NEGATIVE mg/dL
Nitrite: NEGATIVE
PROTEIN: NEGATIVE mg/dL
SPECIFIC GRAVITY, URINE: 1.017 (ref 1.005–1.030)
pH: 7.5 (ref 5.0–8.0)

## 2017-02-21 LAB — PROTIME-INR
INR: 1.08
Prothrombin Time: 14 seconds (ref 11.4–15.2)

## 2017-02-21 LAB — BRAIN NATRIURETIC PEPTIDE: B NATRIURETIC PEPTIDE 5: 20.7 pg/mL (ref 0.0–100.0)

## 2017-02-21 MED ORDER — ONDANSETRON HCL 4 MG/2ML IJ SOLN
4.0000 mg | Freq: Once | INTRAMUSCULAR | Status: AC
  Administered 2017-02-21: 4 mg via INTRAVENOUS
  Filled 2017-02-21: qty 2

## 2017-02-21 MED ORDER — PANTOPRAZOLE SODIUM 40 MG IV SOLR
40.0000 mg | Freq: Once | INTRAVENOUS | Status: AC
  Administered 2017-02-21: 40 mg via INTRAVENOUS
  Filled 2017-02-21: qty 40

## 2017-02-21 MED ORDER — ONDANSETRON HCL 4 MG PO TABS: 4.0000 mg | ORAL_TABLET | Freq: Four times a day (QID) | ORAL | 0 refills | Status: DC

## 2017-02-21 MED ORDER — IPRATROPIUM-ALBUTEROL 0.5-2.5 (3) MG/3ML IN SOLN
3.0000 mL | Freq: Four times a day (QID) | RESPIRATORY_TRACT | Status: DC
  Administered 2017-02-21: 3 mL via RESPIRATORY_TRACT
  Filled 2017-02-21: qty 3

## 2017-02-21 NOTE — Discharge Instructions (Signed)
Continue to work with your family doctor in your gastroenterologist as well as her other specialists to identify the cause of your symptoms.

## 2017-02-21 NOTE — ED Triage Notes (Signed)
Patient reports that she has not felt good since this am. She reports that she is having a Headache, abdominal pain and stomach pain. The patient reports that all of this today " Hurts worse than my everyday day. Patient reports that she took a nap and soiled herself. Her abdominal pain is worse than her usually pain,

## 2017-02-21 NOTE — ED Notes (Signed)
Assumed care of patient from [place], [place]. Pt awaiting disposition by EDP. NO distress. No complaints. States pain has improved.

## 2017-02-21 NOTE — ED Provider Notes (Signed)
[facility]PT [facility] Provider Note   CSN: [id number] Arrival date & time: 02/21/17  1643     History   Chief Complaint Chief Complaint  Patient presents with  . Headache    HPI Stephanie Miles is a 36 y.o. female.  HPI Patient has multiple symptoms. Predominantly she reports was different from her typical symptoms is that she feels extremely fatigued and just not well. She reports at baseline she has headaches which are often more severe than she is experiencing now. She also reports she has irritable bowel and she had baseline has problems with loose and diarrheal stool. She also has problems with abdominal pain although it is not all that severe at this time. She reports she is concerned because she was at work and she got so fatigued and feeling so poorly that she had to go home. She reports she tried to take a nap and then sometime and she was napping she soiled herself with bowel movement. She reports that is not happening to her in a number of years. She is working closely with multiple specialists to evaluate some chronic elevated hepatic enzymes and chronic symptoms of fatigue and generalized pain. Past Medical History:  Diagnosis Date  . Allergy   . Anxiety   . Aortic regurgitation due to bicuspid aortic valve   . Arthritis   . Asthma    Patient denies  . Borderline diabetes 10/20/2016  . Chronic kidney disease    kidney stones  . Depression   . Family history of breast cancer in female   . Family history of colon cancer   . Fatty liver 11/28/2016  . GERD (gastroesophageal reflux disease)   . Heart murmur   . Hypertension   . IBS (irritable bowel syndrome)   . Insomnia   . Migraine headache   . OCD (obsessive compulsive disorder)   . Orbital cellulitis    at age 72 was hospitalized for 3 months (almost died)   . OSA (obstructive sleep apnea)   . S/P appy 05/2013  . Sleep apnea     Patient Active Problem List   Diagnosis Date Noted  . Family history of breast  cancer in female   . Family history of colon cancer   . Elevated LFTs 01/11/2017  . RUQ abdominal pain 01/11/2017  . PTSD (post-traumatic stress disorder) 12/28/2016  . Primary osteoarthritis of both knees 12/28/2016  . ANA positive 12/28/2016  . Leukocytosis 12/06/2016  . Fatty liver 11/28/2016  . History of kidney stones 10/21/2016  . Osteoarthritis 10/21/2016  . Borderline diabetes 10/20/2016  . Cough variant asthma vs uacs  07/07/2015  . Severe obesity (BMI >= 40) ([place]) 07/07/2015  . Dyspnea 07/07/2015  . Asthma 05/27/2015  . Aortic regurgitation due to bicuspid aortic valve 03/12/2014  . Personal history of tobacco use, presenting hazards to health 01/20/2014  . Anxiety state 10/16/2013  . Migraine 07/08/2013  . Sleep apnea 07/08/2013  . IBS (irritable bowel syndrome) 06/06/2013  . GERD (gastroesophageal reflux disease) 06/06/2013  . HTN (hypertension) 06/06/2013    Past Surgical History:  Procedure Laterality Date  . COLONOSCOPY    . DENTAL SURGERY     wisdom teeth  . LAPAROSCOPIC APPENDECTOMY N/A 06/06/2013   Procedure: APPENDECTOMY LAPAROSCOPIC;  Surgeon: Imogene Burn. Georgette Dover, MD;  Location: [facility];  Service: General;  Laterality: N/A;    OB History    No data available       Home Medications    Prior to Admission  medications   Medication Sig Start Date End Date Taking? Authorizing Provider  ACCU-CHEK SOFTCLIX LANCETS lancets Use as instructed 01/09/17   Debbrah Alar, NP  Blood Glucose Monitoring Suppl (ACCU-CHEK GUIDE) w/Device KIT 1 each by Does not apply route daily. 01/09/17   Debbrah Alar, NP  clotrimazole-betamethasone (LOTRISONE) cream Apply 1 application topically 2 (two) times daily. 11/21/16   Debbrah Alar, NP  dicyclomine (BENTYL) 20 MG tablet TAKE 1 TABLET BY MOUTH 4 TIMES DAILY BEFORE MEALS AND AT BEDTIME 10/17/16   Debbrah Alar, NP  escitalopram (LEXAPRO) 10 MG tablet Take 1 tablet (10 mg total) by mouth daily. 11/21/16    Debbrah Alar, NP  fluticasone (FLONASE) 50 MCG/ACT nasal spray Place 2 sprays into both nostrils daily.    [provider]  furosemide (LASIX) 40 MG tablet Take 1 tablet (40 mg total) by mouth every morning. 10/17/16   Debbrah Alar, NP  glucose blood (ACCU-CHEK GUIDE) test strip Use as instructed to check blood sugar once a day. 01/09/17   Debbrah Alar, NP  losartan (COZAAR) 100 MG tablet Take 1 tablet (100 mg total) by mouth daily. 10/17/16   Debbrah Alar, NP  meloxicam (MOBIC) 15 MG tablet TAKE ONE TABLET BY MOUTH DAILY FOR 2 WEEKS THEN AS NEEDED TAKE WITH FOOD 01/23/17   Leandrew Koyanagi, MD  metoprolol succinate (TOPROL-XL) 50 MG 24 hr tablet Take 1 tablet (50 mg total) by mouth daily. Take with or immediately following a meal. 12/08/16   Debbrah Alar, NP  mirtazapine (REMERON) 15 MG tablet Take 1 tablet (15 mg total) by mouth at bedtime. 10/17/16   Debbrah Alar, NP  norgestimate-ethinyl estradiol (ORTHO-CYCLEN,SPRINTEC,PREVIFEM) 0.25-35 MG-MCG tablet Take 1 tablet by mouth every evening. 10/17/16   Debbrah Alar, NP  omeprazole (PRILOSEC) 20 MG capsule Take 71m before breakfast and 243mbefore supper. 10/17/16   O'Debbrah AlarNP  ondansetron (ZOFRAN) 4 MG tablet Take 1 tablet (4 mg total) by mouth every 6 (six) hours. 02/21/17   PfCharlesetta ShanksMD  Pancrelipase, Lip-Prot-Amyl, (CREON) 24000-76000 units CPEP Take 3 capsules (72,000 Units total) by mouth 3 (three) times daily before meals. 12/12/16   [place], SaHolli HumblesNP  potassium chloride (K-DUR,KLOR-CON) 10 MEQ tablet Take 1 tablet (10 mEq total) by mouth every other day. 10/17/16   O'Debbrah AlarNP  SUMAtriptan (IMITREX) 50 MG tablet Take 1 tablet (50 mg total) by mouth every 2 (two) hours as needed for migraine. May repeat in 2 hours if headache persists or recurs. 10/17/16   O'Debbrah AlarNP  traZODone (DESYREL) 50 MG tablet Take 1-2 tablets (50-100 mg total) by mouth at  bedtime. 10/17/16   O'Debbrah AlarNP    Family History Family History  Problem Relation Age of Onset  . Asthma Mother   . Hypertension Mother   . Breast cancer Father [id number]. Diabetes Father   . Hypertension Father   . Heart disease Father   . Hyperlipidemia Father   . Asthma Sister   . Hyperlipidemia Brother   . Hypertension Brother   . Vision loss Brother   . Colon cancer Maternal Grandmother 413. Liver cancer Maternal Grandmother   . Cervical cancer Maternal Aunt   . Stroke Paternal Uncle 4566. Melanoma Maternal Grandfather 8167. Colon polyps Maternal Grandfather   . AAA (abdominal aortic aneurysm) Paternal Grandmother   . Heart disease Paternal Grandfather   . Heart disease Paternal Uncle 5726. Colon cancer Other 4232  MGM's mother  . Stomach cancer Other 47       MGM's mother  . Breast cancer Other        MGM's maternal aunt  . Cancer Other        MGM's maternal uncle with GI cancer  . Esophageal cancer Neg Hx   . Pancreatic cancer Neg Hx   . Rectal cancer Neg Hx     Social History Social History  Substance Use Topics  . Smoking status: Former Smoker    Packs/day: 1.00    Years: 20.00    Types: Cigarettes    Quit date: 02/22/2013  . Smokeless tobacco: Never Used  . Alcohol use 0.0 oz/week     Comment: social     Allergies   Codeine; Adhesive [tape]; and Amlodipine   Review of Systems Review of Systems 10 Systems reviewed and are negative for acute change except as noted in the HPI.   Physical Exam Updated Vital Signs BP 129/86 (BP Location: Right Arm)   Pulse 84   Temp 99.7 F (37.6 C) (Oral)   Resp 18   Ht 5' 6"  (1.676 m)   Wt 117.9 kg (260 lb)   LMP 01/21/2017   SpO2 97%   BMI 41.97 kg/m   Physical Exam  Constitutional: She is oriented to person, place, and time. She appears well-developed and well-nourished. No distress.  Patient is mildly obese. She is alert and nontoxic. No respiratory distress.  HENT:  Head:  Normocephalic and atraumatic.  Right Ear: External ear normal.  Left Ear: External ear normal.  Nose: Nose normal.  Mouth/Throat: Oropharynx is clear and moist.  Eyes: Pupils are equal, round, and reactive to light. Conjunctivae and EOM are normal.  Neck: Neck supple.  Cardiovascular: Normal rate and regular rhythm.   No murmur heard. Pulmonary/Chest: Effort normal. No respiratory distress. She has wheezes.  Patient has no respiratory distress. She does have diffuse expiratory wheeze.  Abdominal: Soft. She exhibits no distension. There is no tenderness. There is no guarding.  Musculoskeletal: Normal range of motion. She exhibits no edema or tenderness.  Neurological: She is alert and oriented to person, place, and time. No cranial nerve deficit. She exhibits normal muscle tone. Coordination normal.  Skin: Skin is warm and dry.  Psychiatric: She has a normal mood and affect.  Nursing note and vitals reviewed.    ED Treatments / Results  Labs (all labs ordered are listed, but only abnormal results are displayed) Labs Reviewed  COMPREHENSIVE METABOLIC PANEL - Abnormal; Notable for the following:       Result Value   Glucose, Bld 110 (*)    Albumin 3.4 (*)    AST 79 (*)    ALT 91 (*)    Total Bilirubin 0.2 (*)    All other components within normal limits  CBC WITH DIFFERENTIAL/PLATELET - Abnormal; Notable for the following:    WBC 13.9 (*)    HCT 35.7 (*)    Neutro Abs 8.0 (*)    Lymphs Abs 4.5 (*)    All other components within normal limits  URINALYSIS, ROUTINE W REFLEX MICROSCOPIC - Abnormal; Notable for the following:    Leukocytes, UA TRACE (*)    All other components within normal limits  URINALYSIS, MICROSCOPIC (REFLEX) - Abnormal; Notable for the following:    Bacteria, UA RARE (*)    Squamous Epithelial / LPF 0-5 (*)    All other components within normal limits  LIPASE, BLOOD  PROTIME-INR  BRAIN  NATRIURETIC PEPTIDE    EKG  EKG Interpretation None        Radiology No results found.  Procedures Procedures (including critical care time)  Medications Ordered in ED Medications  ipratropium-albuterol (DUONEB) 0.5-2.5 (3) MG/3ML nebulizer solution 3 mL (3 mLs Nebulization Given 02/21/17 1814)  ondansetron (ZOFRAN) injection 4 mg (4 mg Intravenous Given 02/21/17 1823)  pantoprazole (PROTONIX) injection 40 mg (40 mg Intravenous Given 02/21/17 1823)     Initial Impression / Assessment and Plan / ED Course  I have reviewed the triage vital signs and the nursing notes.  Pertinent labs & imaging results that were available during my care of the patient were reviewed by me and considered in my medical decision making (see chart for details).     Final Clinical Impressions(s) / ED Diagnoses   Final diagnoses:  Fatigue, unspecified type  Generalized pain   At this time, etiology of patient's symptoms are unclear. There have been months of these symptoms and patient has been getting specialty evaluation including gastroenterology. She has a hepatic biopsy scheduled for this week. She also is seen rheumatology. She has seen pulmonology and her wheezing is diagnosed secondary to reflux and not asthma. Patient's LFT elevations are mild and persistent. Clinically patient is nontoxic, alert and otherwise well and appearance. Diagnostic testing shows no acute changes. At this time I feel she is stable to continue working with her PCP and multiple specialists to try to identify etiology of patient's persistent symptoms for over several months duration. Symptomatic treatment patient has been given Zofran, protonix and DuoNeb for wheezing although she does not report shortness of breath. New Prescriptions New Prescriptions   ONDANSETRON (ZOFRAN) 4 MG TABLET    Take 1 tablet (4 mg total) by mouth every 6 (six) hours.     Charlesetta Shanks, MD 02/21/17 [phone]

## 2017-02-21 NOTE — Telephone Encounter (Signed)
Pt states she hasn't been feeling well lately, but today she feels worse.  Pt went to work, was told by peers that she looked like she wasn't feeling well  She stated that she wasn't feeling.  Decided to go home around lunch time.  She ate and fell asleep.  She woke up and had soiled herself.  She has had 6-7 stools today (hx. IBS).    She walked into clinic at 4:30 pm with c/o frontal headache (5/10), elevated blood sugar (154 per patient), elevated blood pressure, and just not feeling well.  Feels tired.  Denies chest pain, shortness of breath.    Temp-98.7 BP-142/97 P-104 O2 Sat: 97%  Melissa currently out of office.  No openings in clinic.  Pt arrived to clinic via [facility] Po[facility], unaccompanied.  Pt was advise to go to the ER for further evaluation.  Pt was escorted to the ER by nurse via wheelchair.     Message routed to Dr. Charlett Blake (Doc of the Day) for FYI.     Marland Kitchen

## 2017-02-22 NOTE — Telephone Encounter (Signed)
Good plan

## 2017-02-28 ENCOUNTER — Other Ambulatory Visit: Payer: Self-pay | Admitting: Radiology

## 2017-02-28 ENCOUNTER — Ambulatory Visit: Payer: Self-pay | Admitting: Genetic Counselor

## 2017-02-28 ENCOUNTER — Encounter: Payer: Self-pay | Admitting: Genetic Counselor

## 2017-02-28 ENCOUNTER — Telehealth: Payer: Self-pay | Admitting: Genetic Counselor

## 2017-02-28 DIAGNOSIS — Z800 Family history of malignant neoplasm of digestive organs: Secondary | ICD-10-CM

## 2017-02-28 DIAGNOSIS — Z1379 Encounter for other screening for genetic and chromosomal anomalies: Secondary | ICD-10-CM

## 2017-02-28 DIAGNOSIS — Z803 Family history of malignant neoplasm of breast: Secondary | ICD-10-CM

## 2017-02-28 HISTORY — DX: Encounter for other screening for genetic and chromosomal anomalies: Z13.79

## 2017-02-28 NOTE — Progress Notes (Signed)
HPI: [name] was previously seen in the [place] clinic due to a family history of cancer and concerns regarding a hereditary predisposition to cancer. Please refer to our prior cancer genetics clinic note for more information regarding [name]'s medical, social and family histories, and our assessment and recommendations, at the time. [name] recent genetic test results were disclosed to her, as were recommendations warranted by these results. These results and recommendations are discussed in more detail below.  CANCER HISTORY:   No history exists.    FAMILY HISTORY:  We obtained a detailed, 4-generation family history.  Significant diagnoses are listed below: Family History  Problem Relation Age of Onset  . Asthma Mother   . Hypertension Mother   . Breast cancer Father 38  . Diabetes Father   . Hypertension Father   . Heart disease Father   . Hyperlipidemia Father   . Asthma Sister   . Hyperlipidemia Brother   . Hypertension Brother   . Vision loss Brother   . Colon cancer Maternal Grandmother 103  . Liver cancer Maternal Grandmother   . Cervical cancer Maternal Aunt   . Stroke Paternal Uncle 38  . Melanoma Maternal Grandfather 46  . Colon polyps Maternal Grandfather   . AAA (abdominal aortic aneurysm) Paternal Grandmother   . Heart disease Paternal Grandfather   . Heart disease Paternal Uncle 38  . Colon cancer Other 53       MGM's mother  . Stomach cancer Other 32       MGM's mother  . Breast cancer Other        MGM's maternal aunt  . Cancer Other        MGM's maternal uncle with GI cancer  . Esophageal cancer Neg Hx   . Pancreatic cancer Neg Hx   . Rectal cancer Neg Hx     The patient does not have children.  She has two younger siblings, a brother and sister, who are both cancer free.  Her father was diagnosed with breast cancer at age 16 and died at 24.  Her mother is alive and cancer free.  The patient's father has one sister and  three brothers.  Two brothers died of heart disease, one at age 38.  None had cancer.  The patient's paternal grandmother died of an AAA, and her grandfather died of heart disease.  His sister had colon cancer and his mother had bilateral breast cancer at age 57 and colon cancer at age 27.  The patient's mother has two sisters, one who has had cervical cancer twice.  The patient's maternal grandmother had colon cancer at 86 and died at 1, and her grandfather had melanoma at 74.  Her grandmother was an only child, but her mother (patient's great grandmother) had colon cancer at 20 and gastric cancer at 64.  This great grandmother had a sister with beast cancer and a brother with a GI cancer.  [name] is unaware of previous family history of genetic testing for hereditary cancer risks. Patient's maternal ancestors are of [place] and [place] descent, and paternal ancestors are of [place] descent. There is no reported Ashkenazi Jewish ancestry. There is no known consanguinity.  GENETIC TEST RESULTS: Genetic testing reported out on February 24, 2017 through the common hereditary cancer panel found no deleterious mutations.  The Hereditary Gene Panel offered by [facility] includes sequencing and/or deletion duplication testing of the following 46 genes: APC, ATM, AXIN2, BARD1, BMPR1A, BRCA1, BRCA2, BRIP1, CDH1,  CDKN2A (p14ARF), CDKN2A (p16INK4a), CHEK2, CTNNA1, DICER1, EPCAM (Deletion/duplication testing only), GREM1 (promoter region deletion/duplication testing only), KIT, MEN1, MLH1, MSH2, MSH3, MSH6, MUTYH, NBN, NF1, NHTL1, PALB2, PDGFRA, PMS2, POLD1, POLE, PTEN, RAD50, RAD51C, RAD51D, SDHB, SDHC, SDHD, SMAD4, SMARCA4. STK11, TP53, TSC1, TSC2, and VHL.  The following genes were evaluated for sequence changes only: SDHA and HOXB13 c.251G>A variant only.  The test report has been scanned into EPIC and is located under the Molecular Pathology section of the Results Review tab.   We discussed with [name]  that since the current genetic testing is not perfect, it is possible there may be a gene mutation in one of these genes that current testing cannot detect, but that chance is small. We also discussed, that it is possible that another gene that has not yet been discovered, or that we have not yet tested, is responsible for the cancer diagnoses in the family, and it is, therefore, important to remain in touch with cancer genetics in the future so that we can continue to offer [name] the most up to date genetic testing.    CANCER SCREENING RECOMMENDATIONS: This result is reassuring and indicates that [name] likely does not have an increased risk for a future cancer due to a mutation in one of these genes. This normal test also suggests that [name] cancer was most likely not due to an inherited predisposition associated with one of these genes.  Most cancers happen by chance and this negative test suggests that her cancer falls into this category.  We, therefore, recommended she continue to follow the cancer management and screening guidelines provided by her oncology and primary healthcare provider.   Based on the [name] family history of cancer, as well as her genetic test results, a statistical model (Tyrer Cusik)  and literature data were used to estimate her risk of developing breast cancer. This estimates her lifetime risk of developing breast cancer to be approximately 26%.  The patient's lifetime breast cancer risk is a preliminary estimate based on available information using one of several models endorsed by the [place] (ACS). The ACS recommends consideration of breast MRI screening as an adjunct to mammography for patients at high risk (defined as 20% or greater lifetime risk). A more detailed breast cancer risk assessment can be considered, if clinically indicated.   [name] has been determined to be at high risk for breast cancer.  Therefore, we recommend  that annual screening with mammography and breast MRI begin at age 41, or 10 years prior to the age of breast cancer diagnosis in a relative (whichever is earlier).  We discussed that [name] should discuss her individual situation with her referring physician and determine a breast cancer screening plan with which they are both comfortable.      RECOMMENDATIONS FOR FAMILY MEMBERS: Women in this family might be at some increased risk of developing cancer, over the general population risk, simply due to the family history of cancer. We recommended women in this family have a yearly mammogram beginning at age 30, or 73 years younger than the earliest onset of cancer, an annual clinical breast exam, and perform monthly breast self-exams. Women in this family should also have a gynecological exam as recommended by their primary provider. All family members should have a colonoscopy by age 46.  FOLLOW-UP: Lastly, we discussed with [name] that cancer genetics is a rapidly advancing field and it is possible that new genetic tests will  be appropriate for her and/or her family members in the future. We encouraged her to remain in contact with cancer genetics on an annual basis so we can update her personal and family histories and let her know of advances in cancer genetics that may benefit this family.   Our contact number was provided. [name] questions were answered to her satisfaction, and she knows she is welcome to call us at anytime with additional questions or concerns.   Roma Kayser, MS, [facility] Certified Genetic Counselor [email or web address]

## 2017-02-28 NOTE — Telephone Encounter (Signed)
Revealed negative genetic testing.  Discussed that we do not know why there is cancer in the family. It could be due to a different gene that we are not testing, or maybe our current technology may not be able to pick something up.  It will be important for her to keep in contact with genetics to keep up with whether additional testing may be needed.  Discussed that she is at increased risk for breast cancer at 26% lifetime risk.  ACS recommends breast MRI on patients over 20%

## 2017-03-01 ENCOUNTER — Encounter (HOSPITAL_COMMUNITY): Payer: Self-pay

## 2017-03-01 ENCOUNTER — Emergency Department (HOSPITAL_BASED_OUTPATIENT_CLINIC_OR_DEPARTMENT_OTHER)
Admission: EM | Admit: 2017-03-01 | Discharge: 2017-03-02 | Disposition: A | Discharge status: Home / Self Care | Payer: Managed Care, Other (non HMO) | Attending: Emergency Medicine | Admitting: Emergency Medicine

## 2017-03-01 ENCOUNTER — Emergency Department (HOSPITAL_BASED_OUTPATIENT_CLINIC_OR_DEPARTMENT_OTHER): Payer: Managed Care, Other (non HMO)

## 2017-03-01 ENCOUNTER — Ambulatory Visit (HOSPITAL_COMMUNITY)
Admission: RE | Admit: 2017-03-01 | Discharge: 2017-03-01 | Disposition: A | Discharge status: Home / Self Care | Payer: Managed Care, Other (non HMO) | Source: Ambulatory Visit | Attending: Gastroenterology | Admitting: Gastroenterology

## 2017-03-01 ENCOUNTER — Encounter (HOSPITAL_BASED_OUTPATIENT_CLINIC_OR_DEPARTMENT_OTHER): Payer: Self-pay | Admitting: Emergency Medicine

## 2017-03-01 DIAGNOSIS — M353 Polymyalgia rheumatica: Secondary | ICD-10-CM

## 2017-03-01 DIAGNOSIS — G4700 Insomnia, unspecified: Secondary | ICD-10-CM

## 2017-03-01 DIAGNOSIS — L659 Nonscarring hair loss, unspecified: Secondary | ICD-10-CM | POA: Insufficient documentation

## 2017-03-01 DIAGNOSIS — Z800 Family history of malignant neoplasm of digestive organs: Secondary | ICD-10-CM | POA: Insufficient documentation

## 2017-03-01 DIAGNOSIS — G4733 Obstructive sleep apnea (adult) (pediatric): Secondary | ICD-10-CM | POA: Insufficient documentation

## 2017-03-01 DIAGNOSIS — I129 Hypertensive chronic kidney disease with stage 1 through stage 4 chronic kidney disease, or unspecified chronic kidney disease: Secondary | ICD-10-CM | POA: Diagnosis not present

## 2017-03-01 DIAGNOSIS — R740 Nonspecific elevation of levels of transaminase and lactic acid dehydrogenase [LDH]: Secondary | ICD-10-CM | POA: Insufficient documentation

## 2017-03-01 DIAGNOSIS — K219 Gastro-esophageal reflux disease without esophagitis: Secondary | ICD-10-CM | POA: Insufficient documentation

## 2017-03-01 DIAGNOSIS — K760 Fatty (change of) liver, not elsewhere classified: Secondary | ICD-10-CM | POA: Diagnosis not present

## 2017-03-01 DIAGNOSIS — F429 Obsessive-compulsive disorder, unspecified: Secondary | ICD-10-CM | POA: Insufficient documentation

## 2017-03-01 DIAGNOSIS — G8929 Other chronic pain: Secondary | ICD-10-CM | POA: Insufficient documentation

## 2017-03-01 DIAGNOSIS — Z87891 Personal history of nicotine dependence: Secondary | ICD-10-CM | POA: Diagnosis not present

## 2017-03-01 DIAGNOSIS — N189 Chronic kidney disease, unspecified: Secondary | ICD-10-CM | POA: Insufficient documentation

## 2017-03-01 DIAGNOSIS — R110 Nausea: Secondary | ICD-10-CM

## 2017-03-01 DIAGNOSIS — Q231 Congenital insufficiency of aortic valve: Secondary | ICD-10-CM | POA: Insufficient documentation

## 2017-03-01 DIAGNOSIS — R1011 Right upper quadrant pain: Secondary | ICD-10-CM

## 2017-03-01 DIAGNOSIS — F419 Anxiety disorder, unspecified: Secondary | ICD-10-CM

## 2017-03-01 DIAGNOSIS — G8918 Other acute postprocedural pain: Secondary | ICD-10-CM

## 2017-03-01 DIAGNOSIS — K589 Irritable bowel syndrome without diarrhea: Secondary | ICD-10-CM

## 2017-03-01 DIAGNOSIS — F329 Major depressive disorder, single episode, unspecified: Secondary | ICD-10-CM

## 2017-03-01 DIAGNOSIS — Z79899 Other long term (current) drug therapy: Secondary | ICD-10-CM | POA: Diagnosis not present

## 2017-03-01 DIAGNOSIS — R7401 Elevation of levels of liver transaminase levels: Secondary | ICD-10-CM

## 2017-03-01 DIAGNOSIS — J45909 Unspecified asthma, uncomplicated: Secondary | ICD-10-CM | POA: Insufficient documentation

## 2017-03-01 DIAGNOSIS — E1122 Type 2 diabetes mellitus with diabetic chronic kidney disease: Secondary | ICD-10-CM | POA: Insufficient documentation

## 2017-03-01 DIAGNOSIS — K7581 Nonalcoholic steatohepatitis (NASH): Secondary | ICD-10-CM | POA: Insufficient documentation

## 2017-03-01 LAB — CBC WITH DIFFERENTIAL/PLATELET
BASOS ABS: 0.1 10*3/uL (ref 0.0–0.1)
BASOS PCT: 1 %
Eosinophils Absolute: 0.4 10*3/uL (ref 0.0–0.7)
Eosinophils Relative: 3 %
HEMATOCRIT: 35.5 % — AB (ref 36.0–46.0)
HEMOGLOBIN: 11.6 g/dL — AB (ref 12.0–15.0)
Lymphocytes Relative: 23 %
Lymphs Abs: 3.6 10*3/uL (ref 0.7–4.0)
MCH: 28 pg (ref 26.0–34.0)
MCHC: 32.7 g/dL (ref 30.0–36.0)
MCV: 85.5 fL (ref 78.0–100.0)
MONO ABS: 1.1 10*3/uL — AB (ref 0.1–1.0)
Monocytes Relative: 7 %
NEUTROS ABS: 10.4 10*3/uL — AB (ref 1.7–7.7)
NEUTROS PCT: 66 %
Platelets: 258 10*3/uL (ref 150–400)
RBC: 4.15 MIL/uL (ref 3.87–5.11)
RDW: 14.2 % (ref 11.5–15.5)
WBC: 15.5 10*3/uL — AB (ref 4.0–10.5)

## 2017-03-01 LAB — BASIC METABOLIC PANEL
ANION GAP: 9 (ref 5–15)
BUN: 13 mg/dL (ref 6–20)
CALCIUM: 8.6 mg/dL — AB (ref 8.9–10.3)
CHLORIDE: 101 mmol/L (ref 101–111)
CO2: 28 mmol/L (ref 22–32)
CREATININE: 0.78 mg/dL (ref 0.44–1.00)
GFR calc non Af Amer: >60 mL/min (ref >60)
Glucose, Bld: 124 mg/dL — ABNORMAL HIGH (ref 65–99)
Potassium: 3.5 mmol/L (ref 3.5–5.1)
SODIUM: 138 mmol/L (ref 135–145)

## 2017-03-01 LAB — CBC
HEMATOCRIT: 36.1 % (ref 36.0–46.0)
Hemoglobin: 11.9 g/dL — ABNORMAL LOW (ref 12.0–15.0)
MCH: 27.7 pg (ref 26.0–34.0)
MCHC: 33 g/dL (ref 30.0–36.0)
MCV: 84 fL (ref 78.0–100.0)
Platelets: 288 10*3/uL (ref 150–400)
RBC: 4.3 MIL/uL (ref 3.87–5.11)
RDW: 14.1 % (ref 11.5–15.5)
WBC: 13.1 10*3/uL — ABNORMAL HIGH (ref 4.0–10.5)

## 2017-03-01 LAB — APTT: APTT: 29 s (ref 24–36)

## 2017-03-01 LAB — PREGNANCY, URINE: Preg Test, Ur: NEGATIVE

## 2017-03-01 LAB — PROTIME-INR
INR: 1.11
Prothrombin Time: 14.3 seconds (ref 11.4–15.2)

## 2017-03-01 MED ORDER — FENTANYL CITRATE (PF) 100 MCG/2ML IJ SOLN
INTRAMUSCULAR | Status: AC
  Filled 2017-03-01: qty 2

## 2017-03-01 MED ORDER — MIDAZOLAM HCL 2 MG/2ML IJ SOLN
INTRAMUSCULAR | Status: AC | PRN
  Administered 2017-03-01 (×2): 1 mg via INTRAVENOUS

## 2017-03-01 MED ORDER — ONDANSETRON HCL 8 MG PO TABS: 8.0000 mg | ORAL_TABLET | Freq: Once | ORAL | Status: DC | PRN

## 2017-03-01 MED ORDER — OXYCODONE HCL 5 MG PO TABS
10.0000 mg | ORAL_TABLET | Freq: Once | ORAL | Status: AC
  Administered 2017-03-01: 10 mg via ORAL

## 2017-03-01 MED ORDER — MIDAZOLAM HCL 2 MG/2ML IJ SOLN
INTRAMUSCULAR | Status: AC
  Filled 2017-03-01: qty 2

## 2017-03-01 MED ORDER — FENTANYL CITRATE (PF) 100 MCG/2ML IJ SOLN
INTRAMUSCULAR | Status: AC | PRN
  Administered 2017-03-01 (×3): 50 ug via INTRAVENOUS

## 2017-03-01 MED ORDER — OXYCODONE HCL 5 MG PO TABS
ORAL_TABLET | ORAL | Status: AC
  Filled 2017-03-01: qty 2

## 2017-03-01 MED ORDER — LIDOCAINE HCL (PF) 1 % IJ SOLN
INTRAMUSCULAR | Status: AC
  Filled 2017-03-01: qty 30

## 2017-03-01 MED ORDER — GELATIN ABSORBABLE 12-7 MM EX MISC
CUTANEOUS | Status: AC
  Filled 2017-03-01: qty 1

## 2017-03-01 MED ORDER — SODIUM CHLORIDE 0.9 % IV SOLN: INTRAVENOUS | Status: DC

## 2017-03-01 NOTE — Procedures (Signed)
Interventional Radiology Procedure Note  Procedure: US guided random liver biopsy.  48L cores x 3  Complications: None  Estimated Blood Loss: <25 mL  Recommendations: - Bedrest x 3 hrs - Path pending  Signed,  Criselda Peaches, MD

## 2017-03-01 NOTE — ED Notes (Signed)
Triage assessment was performed by [name], RN not EMT, [name].

## 2017-03-01 NOTE — Discharge Instructions (Addendum)
Liver Biopsy, Care After Refer to this sheet in the next few weeks. These instructions provide you with information on caring for yourself after your procedure. Your health care provider may also give you more specific instructions. Your treatment has been planned according to current medical practices, but problems sometimes occur. Call your health care provider if you have any problems or questions after your procedure. What can I expect after the procedure? After your procedure, it is typical to have the following:  A small amount of discomfort in the area where the biopsy was done and in the right shoulder or shoulder blade.  A small amount of bruising around the area where the biopsy was done and on the skin over the liver.  Sleepiness and fatigue for the rest of the day.  Follow these instructions at home:  Rest at home for 1-2 days or as directed by your health care provider.  Have a friend or family member stay with you for at least 24 hours.  Because of the medicines used during the procedure, you should not do the following things in the first 24 hours: ? Drive. ? Use machinery. ? Be responsible for the care of other people. ? Sign legal documents. ? Take a bath or shower.  There are many different ways to close and cover an incision, including stitches, skin glue, and adhesive strips. Follow your health care provider's instructions on: ? Incision care. ? Bandage (dressing) changes and removal. ? Incision closure removal.  Do not drink alcohol in the first week.  Do not lift more than 5 pounds or play contact sports for 2 weeks after this test.  Take medicines only as directed by your health care provider. Do not take medicine containing aspirin or non-steroidal anti-inflammatory medicines such as ibuprofen for 1 week after this test.  It is your responsibility to get your test results. Contact a health care provider if:  You have increased bleeding from an incision  that results in more than a small spot of blood.  You have redness, swelling, or increasing pain in any incisions.  You notice a discharge or a bad smell coming from any of your incisions.  You have a fever or chills. Get help right away if:  You develop swelling, bloating, or pain in your abdomen.  You become dizzy or faint.  You develop a rash.  You are nauseous or vomit.  You have difficulty breathing, feel short of breath, or feel faint.  You develop chest pain.  You have problems with your speech or vision.  You have trouble balancing or moving your arms or legs. This information is not intended to replace advice given to you by your health care provider. Make sure you discuss any questions you have with your health care provider. Document Released: 01/28/2005 Document Revised: 12/17/2015 Document Reviewed: 09/06/2013 Elsevier Interactive Patient Education  2018 [employer]. Moderate Conscious Sedation, Adult, Care After These instructions provide you with information about caring for yourself after your procedure. Your health care provider may also give you more specific instructions. Your treatment has been planned according to current medical practices, but problems sometimes occur. Call your health care provider if you have any problems or questions after your procedure. What can I expect after the procedure? After your procedure, it is common:  To feel sleepy for several hours.  To feel clumsy and have poor balance for several hours.  To have poor judgment for several hours.  To vomit if you eat  too soon.  Follow these instructions at home: For at least 24 hours after the procedure:   Do not: ? Participate in activities where you could fall or become injured. ? Drive. ? Use heavy machinery. ? Drink alcohol. ? Take sleeping pills or medicines that cause drowsiness. ? Make important decisions or sign legal documents. ? Take care of children on your  own.  Rest. Eating and drinking  Follow the diet recommended by your health care provider.  If you vomit: ? Drink water, juice, or soup when you can drink without vomiting. ? Make sure you have little or no nausea before eating solid foods. General instructions  Have a responsible adult stay with you until you are awake and alert.  Take over-the-counter and prescription medicines only as told by your health care provider.  If you smoke, do not smoke without supervision.  Keep all follow-up visits as told by your health care provider. This is important. Contact a health care provider if:  You keep feeling nauseous or you keep vomiting.  You feel light-headed.  You develop a rash.  You have a fever. Get help right away if:  You have trouble breathing. This information is not intended to replace advice given to you by your health care provider. Make sure you discuss any questions you have with your health care provider. Document Released: 05/01/2013 Document Revised: 12/14/2015 Document Reviewed: 10/31/2015 Elsevier Interactive Patient Education  [employer].

## 2017-03-01 NOTE — Sedation Documentation (Signed)
Pt anxious

## 2017-03-01 NOTE — H&P (Signed)
Chief Complaint: elevated LFTs  Referring Physician:Dr. Lucio Edward  Supervising Physician: Jacqulynn Cadet  Patient Status: [facility] - Out-pt  HPI: Stephanie Miles is a 36 y.o. female with a history of elevated LFTs, fatty liver disease, DM, CKD, who has been referred to Dr. Fuller Plan with GI for elevated LFTs.  She has been followed by him in the past for IBS.  She normally sees Dr. Estanislado Pandy for polymyalgia and noticed the recent bump in LFTs.  She has occasional abdominal pain with nausea.  She admits to hair loss as well.  She has been referred for random liver biopsy to help determine a possible etiology.    Past Medical History:  Past Medical History:  Diagnosis Date  . Allergy   . Anxiety   . Aortic regurgitation due to bicuspid aortic valve   . Arthritis   . Asthma    Patient denies  . Borderline diabetes 10/20/2016  . Chronic kidney disease    kidney stones  . Depression   . Family history of breast cancer in female   . Family history of colon cancer   . Fatty liver 11/28/2016  . GERD (gastroesophageal reflux disease)   . Heart murmur   . Hypertension   . IBS (irritable bowel syndrome)   . Insomnia   . Migraine headache   . OCD (obsessive compulsive disorder)   . 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 OSA (obstructive sleep apnea)   . S/P appy 05/2013  . Sleep apnea     Past Surgical History:  Past Surgical History:  Procedure Laterality Date  . COLONOSCOPY    . DENTAL SURGERY     wisdom teeth  . LAPAROSCOPIC APPENDECTOMY N/A 06/06/2013   Procedure: APPENDECTOMY LAPAROSCOPIC;  Surgeon: Imogene Burn. Georgette Dover, MD;  Location: [facility];  Service: General;  Laterality: N/A;    Family History:  Family History  Problem Relation Age of Onset  . Asthma Mother   . Hypertension Mother   . Breast cancer Father 45  . Diabetes Father   . Hypertension Father   . Heart disease Father   . Hyperlipidemia Father   . Asthma Sister   .  Hyperlipidemia Brother   . Hypertension Brother   . Vision loss Brother   . Colon cancer Maternal Grandmother 68  . Liver cancer Maternal Grandmother   . Cervical cancer Maternal Aunt   . Stroke Paternal Uncle 73  . Melanoma Maternal Grandfather 10  . Colon polyps Maternal Grandfather   . AAA (abdominal aortic aneurysm) Paternal Grandmother   . Heart disease Paternal Grandfather   . Heart disease Paternal Uncle 86  . Colon cancer Other 64       MGM's mother  . Stomach cancer Other 56       MGM's mother  . Breast cancer Other        MGM's maternal aunt  . Cancer Other        MGM's maternal uncle with GI cancer  . Esophageal cancer Neg Hx   . Pancreatic cancer Neg Hx   . Rectal cancer Neg Hx     Social History:  reports that she quit smoking about 4 years ago. Her smoking use included Cigarettes. She has a 20.00 pack-year smoking history. She has never used smokeless tobacco. She reports that she drinks alcohol. She reports that she does not use drugs.  Allergies:  Allergies  Allergen Reactions  .  Codeine Nausea Only    Needs antiemetic  . Adhesive [Tape]     Tapes, bandaids  . Amlodipine Other (See Comments)    EDEMA    Medications: Medications reviewed in epic  Please HPI for pertinent positives, otherwise complete 10 system ROS negative.  Mallampati Score: MD Evaluation Airway: WNL Heart: WNL Abdomen: WNL Chest/ Lungs: WNL ASA  Classification: 2 Mallampati/Airway Score: One  Physical Exam: BP (!) 139/100   Pulse 77   Temp 98.1 F (36.7 C) (Oral)   Resp 20   Ht 5' 6"  (1.676 m)   Wt 250 lb (113.4 kg)   LMP 12/30/2016 (Within Weeks)   SpO2 99%   BMI 40.35 kg/m  Body mass index is 40.35 kg/m. General: pleasant, obese white female who is laying in bed in NAD HEENT: head is normocephalic, atraumatic.  Sclera are noninjected.  PERRL.  Ears and nose without any masses or lesions.  Mouth is pink and moist Heart: regular, rate, and rhythm.  Normal s1,s2. No  obvious murmurs, gallops, or rubs noted.  Palpable radial and pedal pulses bilaterally Lungs: CTAB, no wheezes, rhonchi, or rales noted.  Respiratory effort nonlabored Abd: soft, NT, ND, +BS, morbidly obese, no masses, hernias, or organomegaly Psych: A&Ox3 with an appropriate affect.   Labs: Results for orders placed or performed during the hospital encounter of 03/01/17 (from the past 48 hour(s))  APTT upon arrival     Status: None   Collection Time: 03/01/17  7:01 AM  Result Value Ref Range   aPTT 29 24 - 36 seconds  CBC upon arrival     Status: Abnormal   Collection Time: 03/01/17  7:01 AM  Result Value Ref Range   WBC 13.1 (H) 4.0 - 10.5 K/uL   RBC 4.30 3.87 - 5.11 MIL/uL   Hemoglobin 11.9 (L) 12.0 - 15.0 g/dL   HCT 36.1 36.0 - 46.0 %   MCV 84.0 78.0 - 100.0 fL   MCH 27.7 26.0 - 34.0 pg   MCHC 33.0 30.0 - 36.0 g/dL   RDW 14.1 11.5 - 15.5 %   Platelets 288 150 - 400 K/uL  Protime-INR upon arrival     Status: None   Collection Time: 03/01/17  7:01 AM  Result Value Ref Range   Prothrombin Time 14.3 11.4 - 15.2 seconds   INR 1.11   Pregnancy, urine     Status: None   Collection Time: 03/01/17  7:08 AM  Result Value Ref Range   Preg Test, Ur NEGATIVE NEGATIVE    Comment:        THE SENSITIVITY OF THIS METHODOLOGY IS >20 mIU/mL.     Imaging: No results found.  Assessment/Plan 1. Elevated liver function tests  We will plan to proceed today with a random liver biopsy.  Her labs and vitals have been reviewed.  Her WBC has been around 13 for the last several weeks based off of her lab report.  She denies any fevers or infectious symptoms. Risks and benefits discussed with the patient including, but not limited to bleeding, infection, damage to adjacent structures or low yield requiring additional tests. All of the patient's questions were answered, patient is agreeable to proceed. Consent signed and in chart.   Thank you for this interesting consult.  I greatly enjoyed  meeting Stephanie Miles and look forward to participating in their care.  A copy of this report was sent to the requesting provider on this date.  Electronically Signed: Henreitta Cea 03/01/2017, 7:59 AM  I spent a total of  30 Minutes   in face to face in clinical consultation, greater than 50% of which was counseling/coordinating care for elevated LFTs

## 2017-03-01 NOTE — ED Triage Notes (Signed)
Pt here [facility], Pt had liver biopsy this morning and reports having pain to the site in her LUQ and radiates to her right shoulder. Pt took 2 hydrocodone at 2000 with no relief. Pt denies N/V/D.

## 2017-03-01 NOTE — Sedation Documentation (Signed)
Patient is resting comfortably. Patient sleeping

## 2017-03-01 NOTE — Sedation Documentation (Signed)
Called report to Norman Clay RN

## 2017-03-01 NOTE — Progress Notes (Addendum)
Pt c/o 7/10 RUQ pain with each breath, ice pack and repositioning patient was done without reducing pain, Dr Jenetta Loges was paged. X2 without return call, Claiborne Billings PA for radiology has now been paged. [phone] call returned from [facility] and will await further orders.

## 2017-03-01 NOTE — Sedation Documentation (Signed)
Pt resting well, awaiting transport to travel to short stay.

## 2017-03-01 NOTE — Sedation Documentation (Signed)
Pt complains of pain

## 2017-03-01 NOTE — ED Provider Notes (Signed)
[facility] Provider Note   CSN: [id number] Arrival date & time: 03/01/17  2130  By signing my name below, I, Marcello Moores, attest that this documentation has been prepared under the direction and in the presence of [place], Takumi Din, MD. Electronically Signed: Marcello Moores, ED Scribe. 03/01/17. 11:08 PM.  History   Chief Complaint Chief Complaint  Patient presents with  . Post-op Problem   The history is provided by the patient. No language interpreter was used.  Abdominal Pain   This is a new problem. The current episode started 12 to 24 hours ago. The problem occurs constantly. The problem has been gradually worsening. Associated with: biopsy. The pain is located in the RUQ. The pain is severe. Associated symptoms include myalgias. Pertinent negatives include fever. Nothing aggravates the symptoms. Nothing relieves the symptoms. Past workup includes GI consult. Her past medical history does not include PUD or ulcerative colitis.   HPI Comments: Marjo Grosvenor is a 36 y.o. female who presents to the Emergency Department complaining of gradually worsening, severe, persistent abdominal pain that radiates to her right shoulder s/p a liver biopsy that occurred this morning. The pt states that she took (2) oxycodone for her current symptoms with no relief. She has a h/x of asthma, arthritis, depression, and GERD. She denies fever, sob cp leg swelling.  Received versed and fentanyl and then Oxycontin and oxycodone which she reports "do nothing for kidney stones."    Past Medical History:  Diagnosis Date  . Allergy   . Anxiety   . Aortic regurgitation due to bicuspid aortic valve   . Arthritis   . Asthma    Patient denies  . Borderline diabetes 10/20/2016  . Chronic kidney disease    kidney stones  . Depression   . Family history of breast cancer in female   . Family history of colon cancer   . Fatty liver 11/28/2016  . GERD (gastroesophageal reflux disease)   . Heart  murmur   . Hypertension   . IBS (irritable bowel syndrome)   . Insomnia   . Migraine headache   . OCD (obsessive compulsive disorder)   . 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 OSA (obstructive sleep apnea)   . S/P appy 05/2013  . Sleep apnea     Patient Active Problem List   Diagnosis Date Noted  . Genetic testing 02/28/2017  . Family history of breast cancer in female   . Family history of colon cancer   . Elevated LFTs 01/11/2017  . RUQ abdominal pain 01/11/2017  . PTSD (post-traumatic stress disorder) 12/28/2016  . Primary osteoarthritis of both knees 12/28/2016  . ANA positive 12/28/2016  . Leukocytosis 12/06/2016  . Fatty liver 11/28/2016  . History of kidney stones 10/21/2016  . Osteoarthritis 10/21/2016  . Borderline diabetes 10/20/2016  . Cough variant asthma vs uacs  07/07/2015  . Severe obesity (BMI >= 40) ([place]) 07/07/2015  . Dyspnea 07/07/2015  . Asthma 05/27/2015  . Aortic regurgitation due to bicuspid aortic valve 03/12/2014  . Personal history of tobacco use, presenting hazards to health 01/20/2014  . Anxiety state 10/16/2013  . Migraine 07/08/2013  . Sleep apnea 07/08/2013  . IBS (irritable bowel syndrome) 06/06/2013  . GERD (gastroesophageal reflux disease) 06/06/2013  . HTN (hypertension) 06/06/2013    Past Surgical History:  Procedure Laterality Date  . COLONOSCOPY    . DENTAL SURGERY     wisdom teeth  .  ESOPHAGOGASTRODUODENOSCOPY    . LAPAROSCOPIC APPENDECTOMY N/A 06/06/2013   Procedure: APPENDECTOMY LAPAROSCOPIC;  Surgeon: Imogene Burn. Georgette Dover, MD;  Location: [facility];  Service: General;  Laterality: N/A;    OB History    No data available       Home Medications    Prior to Admission medications   Medication Sig Start Date End Date Taking? Authorizing Provider  ACCU-CHEK SOFTCLIX LANCETS lancets Use as instructed 01/09/17   Debbrah Alar, NP  Blood Glucose Monitoring Suppl (ACCU-CHEK GUIDE)  w/Device KIT 1 each by Does not apply route daily. 01/09/17   Debbrah Alar, NP  clotrimazole-betamethasone (LOTRISONE) cream Apply 1 application topically 2 (two) times daily. Patient taking differently: Apply 1 application topically 2 (two) times daily as needed.  11/21/16   Debbrah Alar, NP  dicyclomine (BENTYL) 20 MG tablet TAKE 1 TABLET BY MOUTH 4 TIMES DAILY BEFORE MEALS AND AT BEDTIME 10/17/16   Debbrah Alar, NP  escitalopram (LEXAPRO) 10 MG tablet Take 1 tablet (10 mg total) by mouth daily. 11/21/16   Debbrah Alar, NP  fluticasone (FLONASE) 50 MCG/ACT nasal spray Place 2 sprays into both nostrils daily as needed for allergies.     [provider]  furosemide (LASIX) 40 MG tablet Take 1 tablet (40 mg total) by mouth every morning. 10/17/16   Debbrah Alar, NP  glucose blood (ACCU-CHEK GUIDE) test strip Use as instructed to check blood sugar once a day. 01/09/17   Debbrah Alar, NP  losartan (COZAAR) 100 MG tablet Take 1 tablet (100 mg total) by mouth daily. 10/17/16   Debbrah Alar, NP  meloxicam (MOBIC) 15 MG tablet TAKE ONE TABLET BY MOUTH DAILY FOR 2 WEEKS THEN AS NEEDED TAKE WITH FOOD 01/23/17   Leandrew Koyanagi, MD  metoprolol succinate (TOPROL-XL) 50 MG 24 hr tablet Take 1 tablet (50 mg total) by mouth daily. Take with or immediately following a meal. 12/08/16   Debbrah Alar, NP  mirtazapine (REMERON) 15 MG tablet Take 1 tablet (15 mg total) by mouth at bedtime. 10/17/16   Debbrah Alar, NP  Multiple Vitamins-Minerals (MULTIVITAMIN WITH MINERALS) tablet Take 1 tablet by mouth daily.    [provider]  norgestimate-ethinyl estradiol (ORTHO-CYCLEN,SPRINTEC,PREVIFEM) 0.25-35 MG-MCG tablet Take 1 tablet by mouth every evening. 10/17/16   Debbrah Alar, NP  omeprazole (PRILOSEC) 20 MG capsule Take 21m before breakfast and 248mbefore supper. 10/17/16   O'Debbrah AlarNP  ondansetron (ZOFRAN) 4 MG tablet Take 1 tablet (4  mg total) by mouth every 6 (six) hours. Patient taking differently: Take 4 mg by mouth every 8 (eight) hours as needed for nausea or vomiting.  02/21/17   PfCharlesetta ShanksMD  Pancrelipase, Lip-Prot-Amyl, (CREON) 24000-76000 units CPEP Take 3 capsules (72,000 Units total) by mouth 3 (three) times daily before meals. 12/12/16   [place], SaHolli HumblesNP  potassium chloride (K-DUR,KLOR-CON) 10 MEQ tablet Take 1 tablet (10 mEq total) by mouth every other day. 10/17/16   O'Debbrah AlarNP  SUMAtriptan (IMITREX) 50 MG tablet Take 1 tablet (50 mg total) by mouth every 2 (two) hours as needed for migraine. May repeat in 2 hours if headache persists or recurs. 10/17/16   O'Debbrah AlarNP  traZODone (DESYREL) 50 MG tablet Take 1-2 tablets (50-100 mg total) by mouth at bedtime. Patient taking differently: Take 50-100 mg by mouth at bedtime as needed.  10/17/16   O'Debbrah AlarNP    Family History Family History  Problem Relation Age of Onset  . Asthma Mother   .  Hypertension Mother   . Breast cancer Father 10  . Diabetes Father   . Hypertension Father   . Heart disease Father   . Hyperlipidemia Father   . Asthma Sister   . Hyperlipidemia Brother   . Hypertension Brother   . Vision loss Brother   . Colon cancer Maternal Grandmother 73  . Liver cancer Maternal Grandmother   . Cervical cancer Maternal Aunt   . Stroke Paternal Uncle 47  . Melanoma Maternal Grandfather 12  . Colon polyps Maternal Grandfather   . AAA (abdominal aortic aneurysm) Paternal Grandmother   . Heart disease Paternal Grandfather   . Heart disease Paternal Uncle 49  . Colon cancer Other 60       MGM's mother  . Stomach cancer Other 80       MGM's mother  . Breast cancer Other        MGM's maternal aunt  . Cancer Other        MGM's maternal uncle with GI cancer  . Esophageal cancer Neg Hx   . Pancreatic cancer Neg Hx   . Rectal cancer Neg Hx     Social History Social History  Substance Use Topics  .  Smoking status: Former Smoker    Packs/day: 1.00    Years: 20.00    Types: Cigarettes    Quit date: 02/22/2013  . Smokeless tobacco: Never Used  . Alcohol use 0.0 oz/week     Comment: social     Allergies   Codeine; Adhesive [tape]; and Amlodipine   Review of Systems Review of Systems  Constitutional: Negative for fever.  Respiratory: Negative for shortness of breath.   Cardiovascular: Negative for chest pain, palpitations and leg swelling.  Gastrointestinal: Positive for abdominal pain.  Musculoskeletal: Positive for myalgias.  All other systems reviewed and are negative.    Physical Exam Updated Vital Signs BP (!) 152/103 (BP Location: Left Arm)   Pulse 93   Temp 98.3 F (36.8 C) (Oral)   Resp 18   Ht _0  (1.676 m)   Wt 250 lb (113.4 kg)   LMP 12/30/2016 Comment: pt reports irregular periods  SpO2 96%   BMI 40.35 kg/m   Physical Exam  Constitutional: She is oriented to person, place, and time. She appears well-developed and well-nourished. No distress.  HENT:  Head: Normocephalic and atraumatic.  Eyes: Pupils are equal, round, and reactive to light. EOM are normal.  Neck: Normal range of motion.  Cardiovascular: Normal rate, regular rhythm and normal heart sounds.   Pulmonary/Chest: Effort normal and breath sounds normal.  Abdominal: Soft. She exhibits no distension. Bowel sounds are increased. There is tenderness in the right upper quadrant. There is no tenderness at McBurney's point and negative Murphy's sign.  Hyperactive bowel sounds. 5 mm puncture wound in RUQ, clean, dry and intact.  Musculoskeletal: Normal range of motion.  Neurological: She is alert and oriented to person, place, and time.  Skin: Skin is warm and dry.  Psychiatric: She has a normal mood and affect. Judgment normal.  Nursing note and vitals reviewed.    ED Treatments / Results   Vitals:   03/01/17 2139 03/02/17 0002  BP: (!) 152/103 (!) 184/110  Pulse: 93 93  Resp: 18 18    Temp: 98.3 F (36.8 C)     DIAGNOSTIC STUDIES: Oxygen Saturation is 96% on RA, adequate by my interpretation.   COORDINATION OF CARE: 11:09 PM-Discussed next steps with pt. Pt verbalized understanding and is agreeable with  the plan.   Labs (all labs ordered are listed, but only abnormal results are displayed)  Results for orders placed or performed during the hospital encounter of 03/01/17  CBC with Differential/Platelet  Result Value Ref Range   WBC 15.5 (H) 4.0 - 10.5 K/uL   RBC 4.15 3.87 - 5.11 MIL/uL   Hemoglobin 11.6 (L) 12.0 - 15.0 g/dL   HCT 35.5 (L) 36.0 - 46.0 %   MCV 85.5 78.0 - 100.0 fL   MCH 28.0 26.0 - 34.0 pg   MCHC 32.7 30.0 - 36.0 g/dL   RDW 14.2 11.5 - 15.5 %   Platelets 258 150 - 400 K/uL   Neutrophils Relative % 66 %   Neutro Abs 10.4 (H) 1.7 - 7.7 K/uL   Lymphocytes Relative 23 %   Lymphs Abs 3.6 0.7 - 4.0 K/uL   Monocytes Relative 7 %   Monocytes Absolute 1.1 (H) 0.1 - 1.0 K/uL   Eosinophils Relative 3 %   Eosinophils Absolute 0.4 0.0 - 0.7 K/uL   Basophils Relative 1 %   Basophils Absolute 0.1 0.0 - 0.1 K/uL  Basic metabolic panel  Result Value Ref Range   Sodium 138 135 - 145 mmol/L   Potassium 3.5 3.5 - 5.1 mmol/L   Chloride 101 101 - 111 mmol/L   CO2 28 22 - 32 mmol/L   Glucose, Bld 124 (H) 65 - 99 mg/dL   BUN 13 6 - 20 mg/dL   Creatinine, Ser 0.78 0.44 - 1.00 mg/dL   Calcium 8.6 (L) 8.9 - 10.3 mg/dL   GFR calc non Af Amer >60 >60 mL/min   GFR calc Af Amer >60 >60 mL/min   Anion gap 9 5 - 15   Dg Chest 2 View  Result Date: 02/15/2017 CLINICAL DATA:  Cough and fatigue with acute shortness of breath today. EXAM: CHEST  2 VIEW COMPARISON:  01/28/2016 and prior chest radiographs FINDINGS: Upper limits normal heart size and mild peribronchial thickening again noted. There is no evidence of focal airspace disease, pulmonary edema, suspicious pulmonary nodule/mass, pleural effusion, or pneumothorax. No acute bony abnormalities are identified.  IMPRESSION: No evidence of acute cardiopulmonary disease. Upper limits normal heart size and mild chronic peribronchial thickening. Electronically Signed   By: Margarette Canada M.D.   On: 02/15/2017 14:04   Ct Abdomen Pelvis W Contrast  Result Date: 03/02/2017 CLINICAL DATA:  Acute onset of right upper quadrant abdominal pain, radiating to the right shoulder. Initial encounter. EXAM: CT ABDOMEN AND PELVIS WITH CONTRAST TECHNIQUE: Multidetector CT imaging of the abdomen and pelvis was performed using the standard protocol following bolus administration of intravenous contrast. CONTRAST:  120m ISOVUE-300 IOPAMIDOL (ISOVUE-300) INJECTION 61% COMPARISON:  Abdominal ultrasound performed 11/25/2016, and CT of the abdomen and pelvis from 09/25/2016 FINDINGS: Lower chest: The visualized lung bases are grossly clear. The visualized portions of the mediastinum are unremarkable. Hepatobiliary: The liver is unremarkable in appearance. The gallbladder is unremarkable in appearance. The common bile duct remains normal in caliber. Pancreas: The pancreas is within normal limits. Spleen: The spleen is unremarkable in appearance. Adrenals/Urinary Tract: The adrenal glands are unremarkable in appearance. A small left renal cyst is noted. There is no evidence of hydronephrosis. No renal or ureteral stones are identified. No perinephric stranding is seen. Stomach/Bowel: The stomach is unremarkable in appearance. The small bowel is within normal limits. The patient is status post appendectomy. The colon is unremarkable in appearance. Vascular/Lymphatic: The abdominal aorta is unremarkable in appearance. The inferior vena  cava is grossly unremarkable. No retroperitoneal lymphadenopathy is seen. No pelvic sidewall lymphadenopathy is identified. Reproductive: The bladder is mildly distended and within normal limits. The uterus is grossly unremarkable in appearance. The ovaries are relatively symmetric. No suspicious adnexal masses are seen.  Other: No additional soft tissue abnormalities are seen. Musculoskeletal: No acute osseous abnormalities are identified. The visualized musculature is unremarkable in appearance. IMPRESSION: 1. No acute abnormality seen within the abdomen or pelvis. 2. Small left renal cyst noted. Electronically Signed   By: Garald Balding M.D.   On: 03/02/2017 00:35   US Biopsy  Result Date: 03/01/2017 INDICATION: 37 year old female with transaminitis and nonalcoholic fatty liver disease. She presents for random liver biopsy. EXAM: Ultrasound-guided core biopsy of the liver Interventional Radiologist:  Criselda Peaches, MD MEDICATIONS: None. ANESTHESIA/SEDATION: Fentanyl 150 mcg IV; Versed 2 mg IV Moderate Sedation Time:  13 minutes The patient was continuously monitored during the procedure by the interventional radiology nurse under my direct supervision. FLUOROSCOPY TIME:  Fluoroscopy Time: 0 minutes 0 seconds (0 mGy). COMPLICATIONS: None immediate. PROCEDURE: Informed consent was obtained from the patient following explanation of the procedure, risks, benefits and alternatives. The patient understands, agrees and consents for the procedure. All questions were addressed. A time out was performed. The right upper quadrant was interrogated with ultrasound. A relatively avascular plane of the liver was identified. A suitable skin entry site was selected and marked. The region was then sterilely prepped and draped in standard fashion using chlorhexidine skin prep. Local anesthesia was attained by infiltration with 1% lidocaine. A small dermatotomy was made. Under real-time sonographic guidance, a 17 gauge trocar needle was advanced into the liver. Multiple 18 gauge core biopsies were then coaxially obtained. Needle placement was confirmed on all biopsy passes with real-time sonography. Biopsy specimens were placed in formalin and delivered to pathology for further analysis. As the introducer needle was removed, the biopsy  tract was embolized with a Gel-Foam slurry. Post biopsy ultrasound imaging demonstrates no active bleeding or perihepatic hematoma. The patient tolerated the procedure well. IMPRESSION: Technically successful ultrasound-guided random core biopsy of the liver. Electronically Signed   By: Jacqulynn Cadet M.D.   On: 03/01/2017 09:06    Radiology US Biopsy  Result Date: 03/01/2017 INDICATION: 36 year old female with transaminitis and nonalcoholic fatty liver disease. She presents for random liver biopsy. EXAM: Ultrasound-guided core biopsy of the liver Interventional Radiologist:  Criselda Peaches, MD MEDICATIONS: None. ANESTHESIA/SEDATION: Fentanyl 150 mcg IV; Versed 2 mg IV Moderate Sedation Time:  13 minutes The patient was continuously monitored during the procedure by the interventional radiology nurse under my direct supervision. FLUOROSCOPY TIME:  Fluoroscopy Time: 0 minutes 0 seconds (0 mGy). COMPLICATIONS: None immediate. PROCEDURE: Informed consent was obtained from the patient following explanation of the procedure, risks, benefits and alternatives. The patient understands, agrees and consents for the procedure. All questions were addressed. A time out was performed. The right upper quadrant was interrogated with ultrasound. A relatively avascular plane of the liver was identified. A suitable skin entry site was selected and marked. The region was then sterilely prepped and draped in standard fashion using chlorhexidine skin prep. Local anesthesia was attained by infiltration with 1% lidocaine. A small dermatotomy was made. Under real-time sonographic guidance, a 17 gauge trocar needle was advanced into the liver. Multiple 18 gauge core biopsies were then coaxially obtained. Needle placement was confirmed on all biopsy passes with real-time sonography. Biopsy specimens were placed in formalin and delivered to pathology for  further analysis. As the introducer needle was removed, the biopsy tract was  embolized with a Gel-Foam slurry. Post biopsy ultrasound imaging demonstrates no active bleeding or perihepatic hematoma. The patient tolerated the procedure well. IMPRESSION: Technically successful ultrasound-guided random core biopsy of the liver. Electronically Signed   By: Jacqulynn Cadet M.D.   On: 03/01/2017 09:06    Procedures Procedures (including critical care time)  Medications Ordered in ED Medications  ketorolac (TORADOL) 30 MG/ML injection 15 mg (15 mg Intravenous Given 03/02/17 0009)  iopamidol (ISOVUE-300) 61 % injection 100 mL (100 mLs Intravenous Contrast Given 03/02/17 0016)  methocarbamol (ROBAXIN) tablet 1,000 mg (1,000 mg Oral Given 03/02/17 0130)    She has no ride home and therefore cannot receive further sedating medication  Final Clinical Impressions(s) / ED Diagnoses  This is consistent with her chronic pain.  There are no acute findings on contrasted CT scan. Strict return precautions given for facial swelling,  wheezing, cough fevers, chest pain, shortness of breath, drooling, swelling of the mouth or throat, vomiting, weakness, numbness, changes in vision or speech or thinking,  fevers, target rash, streaking up the arm, weakness, inability to tolerate oral liquids or foods, shortness of breath, leg, syncope or any concerns. No signs of systemic illness or infection. The patient is nontoxic-appearing on exam and vital signs are within normal limits.  Follow up with your PMD for recheck and ongoing care.    I have reviewed the triage vital signs and the nursing notes. Pertinent labs &imaging results that were available during my care of the patient were reviewed by me and considered in my medical decision making (see chart for details).  After history, exam, and medical workup I feel the patient has been appropriately medically screened and is safe for discharge home. Pertinent diagnoses were discussed with the patient. Patient was given return precautions.   I  personally performed the services described in this documentation, which was scribed in my presence. The recorded information has been reviewed and is accurate.      Erric Machnik, MD 03/02/17 [phone]

## 2017-03-01 NOTE — Sedation Documentation (Signed)
Pt is resting comfortably with no complaints. Procedure started.

## 2017-03-01 NOTE — Sedation Documentation (Signed)
Procedure finished. Pt tolerated procedure well.

## 2017-03-02 ENCOUNTER — Ambulatory Visit (HOSPITAL_COMMUNITY): Payer: Managed Care, Other (non HMO)

## 2017-03-02 ENCOUNTER — Ambulatory Visit (HOSPITAL_BASED_OUTPATIENT_CLINIC_OR_DEPARTMENT_OTHER)
Admission: RE | Admit: 2017-03-02 | Discharge: 2017-03-02 | Disposition: A | Discharge status: Home / Self Care | Payer: Managed Care, Other (non HMO) | Source: Ambulatory Visit | Attending: Internal Medicine | Admitting: Internal Medicine

## 2017-03-02 ENCOUNTER — Encounter: Payer: Self-pay | Admitting: Internal Medicine

## 2017-03-02 ENCOUNTER — Ambulatory Visit (INDEPENDENT_AMBULATORY_CARE_PROVIDER_SITE_OTHER): Payer: Managed Care, Other (non HMO) | Admitting: Internal Medicine

## 2017-03-02 VITALS — BP 164/98 | HR 98 | Temp 98.1°F | Resp 16 | Ht 65.5 in | Wt 264.4 lb

## 2017-03-02 DIAGNOSIS — R109 Unspecified abdominal pain: Secondary | ICD-10-CM

## 2017-03-02 DIAGNOSIS — G8918 Other acute postprocedural pain: Secondary | ICD-10-CM | POA: Diagnosis not present

## 2017-03-02 DIAGNOSIS — M25519 Pain in unspecified shoulder: Secondary | ICD-10-CM

## 2017-03-02 LAB — CBC WITH DIFFERENTIAL/PLATELET
BASOS PCT: 0.8 % (ref 0.0–3.0)
Basophils Absolute: 0.1 10*3/uL (ref 0.0–0.1)
EOS PCT: 3.4 % (ref 0.0–5.0)
Eosinophils Absolute: 0.4 10*3/uL (ref 0.0–0.7)
HEMATOCRIT: 36.2 % (ref 36.0–46.0)
HEMOGLOBIN: 11.9 g/dL — AB (ref 12.0–15.0)
LYMPHS PCT: 28.8 % (ref 12.0–46.0)
Lymphs Abs: 3.5 10*3/uL (ref 0.7–4.0)
MCHC: 32.9 g/dL (ref 30.0–36.0)
MCV: 85 fl (ref 78.0–100.0)
MONOS PCT: 6.1 % (ref 3.0–12.0)
Monocytes Absolute: 0.7 10*3/uL (ref 0.1–1.0)
Neutro Abs: 7.3 10*3/uL (ref 1.4–7.7)
Neutrophils Relative %: 60.9 % (ref 43.0–77.0)
Platelets: 267 10*3/uL (ref 150.0–400.0)
RBC: 4.26 Mil/uL (ref 3.87–5.11)
RDW: 14.6 % (ref 11.5–15.5)
WBC: 12 10*3/uL — AB (ref 4.0–10.5)

## 2017-03-02 MED ORDER — METHOCARBAMOL 500 MG PO TABS
1000.0000 mg | ORAL_TABLET | Freq: Once | ORAL | Status: AC
  Administered 2017-03-02: 1000 mg via ORAL
  Filled 2017-03-02: qty 2

## 2017-03-02 MED ORDER — KETOROLAC TROMETHAMINE 30 MG/ML IJ SOLN
15.0000 mg | Freq: Once | INTRAMUSCULAR | Status: AC
  Administered 2017-03-02: 15 mg via INTRAVENOUS
  Filled 2017-03-02: qty 1

## 2017-03-02 MED ORDER — KETOROLAC TROMETHAMINE 10 MG PO TABS: 10.0000 mg | ORAL_TABLET | Freq: Four times a day (QID) | ORAL | 0 refills | Status: DC | PRN

## 2017-03-02 MED ORDER — KETOROLAC TROMETHAMINE 30 MG/ML IJ SOLN
30.0000 mg | Freq: Once | INTRAMUSCULAR | Status: AC
  Administered 2017-03-02: 30 mg via INTRAMUSCULAR

## 2017-03-02 MED ORDER — IOPAMIDOL (ISOVUE-300) INJECTION 61%
100.0000 mL | Freq: Once | INTRAVENOUS | Status: AC | PRN
  Administered 2017-03-02: 100 mL via INTRAVENOUS

## 2017-03-02 MED ORDER — DICLOFENAC SODIUM ER 100 MG PO TB24: 100.0000 mg | ORAL_TABLET | Freq: Every day | ORAL | 0 refills | Status: DC

## 2017-03-02 NOTE — ED Notes (Signed)
ED Provider at bedside discussing test results and dispo plan of care.

## 2017-03-02 NOTE — ED Notes (Signed)
Pt was told that we could give her toradol for pain but not a narcotic because she does not have a ride. Pt then called her mother in [place] and while crying said, "Could you come get me and take me to a real hospital.  They are refusing to treat me because I don't have a ride.  They haven't done anything for me and won't treat me because I don't have a ride."  I explained to pt again that the only thing we can't give her is a narcotic, that we have drawn blood and done blood work and I have the toradol to give her and she is being taken to CT for a scan.  Pt again told her mother that we "have not done anything for me and refuse to treat me because I don't have a ride."  Pts mother told her to calm down and cooperate.  Pt agreed to take the toradol.

## 2017-03-02 NOTE — Progress Notes (Signed)
Pre visit review using our clinic review tool, if applicable. No additional management support is needed unless otherwise documented below in the visit note. 

## 2017-03-02 NOTE — Progress Notes (Signed)
Subjective:    Patient ID: Stephanie Miles, female    DOB: [birth year], 36 y.o.   MRN: [id number]  DOS:  03/02/2017 Type of visit - description : Acute Interval history: Patient has a ultrasound guided liver biopsy yesterday morning, after the recovering, she went home, slept until 5 PM. As soon as she woke up she was having a significant amount of pain at the right upper quadrant area. She was able to eat without getting sick, took a Oxycodone leftover. By 9 PM the pain was intensifying, called EMS and went to the ER. At the ER, she was noted to be in no distress, WBC was 15.5, previous white count 13.1. Hemoglobin 11.6 is stable. CT abdomen and pelvis with no acute changes. She is here because she continued to have pain, it is very intense, increased with cough or deep breaths. No radiation of the pain but  is associated with right shoulder pain.   Review of Systems  Denies fever chills. Short of breath? She feels is more difficulty breathing due to increased pain with deep breaths. No abdominal pain per se, the pain is located at the right upper quadrant of the abdomen (by the rib cage) No nausea, no vomiting. No cough No lower extremity edema  Past Medical History:  Diagnosis Date  . Allergy   . Anxiety   . Aortic regurgitation due to bicuspid aortic valve   . Arthritis   . Asthma    Patient denies  . Borderline diabetes 10/20/2016  . Chronic kidney disease    kidney stones  . Depression   . Family history of breast cancer in female   . Family history of colon cancer   . Fatty liver 11/28/2016  . GERD (gastroesophageal reflux disease)   . Heart murmur   . Hypertension   . IBS (irritable bowel syndrome)   . Insomnia   . Migraine headache   . OCD (obsessive compulsive disorder)   . Orbital cellulitis    at age 24 was hospitalized for 3 months (almost died)   . OSA (obstructive sleep apnea)   . S/P appy 05/2013  . Sleep apnea     Past Surgical History:  Procedure  Laterality Date  . COLONOSCOPY    . DENTAL SURGERY     wisdom teeth  . ESOPHAGOGASTRODUODENOSCOPY    . LAPAROSCOPIC APPENDECTOMY N/A 06/06/2013   Procedure: APPENDECTOMY LAPAROSCOPIC;  Surgeon: Imogene Burn. Georgette Dover, MD;  Location: [facility];  Service: General;  Laterality: N/A;    Social History   Social History  . Marital status: Single    Spouse name: N/A  . Number of children: 0  . Years of education: N/A   Occupational History  . Student   . Medical Sales    Social History Main Topics  . Smoking status: Former Smoker    Packs/day: 1.00    Years: 20.00    Types: Cigarettes    Quit date: 02/22/2013  . Smokeless tobacco: Never Used  . Alcohol use 0.0 oz/week     Comment: social  . Drug use: No  . Sexual activity: Not on file   Other Topics Concern  . Not on file   Social History Narrative   Working at 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   Friends and family in [place]  Allergies as of 03/02/2017      Reactions   Codeine Nausea Only   Needs antiemetic   Adhesive [tape]    Tapes, bandaids   Amlodipine Other (See Comments)   EDEMA      Medication List       Accurate as of 03/02/17 11:34 AM. Always use your most recent med list.          ACCU-CHEK GUIDE w/Device Kit 1 each by Does not apply route daily.   ACCU-CHEK SOFTCLIX LANCETS lancets Use as instructed   clotrimazole-betamethasone cream Commonly known as:  LOTRISONE Apply 1 application topically 2 (two) times daily.   Diclofenac Sodium CR 100 MG 24 hr tablet Commonly known as:  VOLTAREN-XR Take 1 tablet (100 mg total) by mouth daily.   dicyclomine 20 MG tablet Commonly known as:  BENTYL TAKE 1 TABLET BY MOUTH 4 TIMES DAILY BEFORE MEALS AND AT BEDTIME   escitalopram 10 MG tablet Commonly known as:  LEXAPRO Take 1 tablet (10 mg total) by mouth daily.   fluticasone 50  MCG/ACT nasal spray Commonly known as:  FLONASE Place 2 sprays into both nostrils daily as needed for allergies.   furosemide 40 MG tablet Commonly known as:  LASIX Take 1 tablet (40 mg total) by mouth every morning.   glucose blood test strip Commonly known as:  ACCU-CHEK GUIDE Use as instructed to check blood sugar once a day.   losartan 100 MG tablet Commonly known as:  COZAAR Take 1 tablet (100 mg total) by mouth daily.   meloxicam 15 MG tablet Commonly known as:  MOBIC TAKE ONE TABLET BY MOUTH DAILY FOR 2 WEEKS THEN AS NEEDED TAKE WITH FOOD   metoprolol succinate 50 MG 24 hr tablet Commonly known as:  TOPROL-XL Take 1 tablet (50 mg total) by mouth daily. Take with or immediately following a meal.   mirtazapine 15 MG tablet Commonly known as:  REMERON Take 1 tablet (15 mg total) by mouth at bedtime.   multivitamin with minerals tablet Take 1 tablet by mouth daily.   norgestimate-ethinyl estradiol 0.25-35 MG-MCG tablet Commonly known as:  ORTHO-CYCLEN,SPRINTEC,PREVIFEM Take 1 tablet by mouth every evening.   omeprazole 20 MG capsule Commonly known as:  PRILOSEC Take 77m before breakfast and 230mbefore supper.   ondansetron 4 MG tablet Commonly known as:  ZOFRAN Take 1 tablet (4 mg total) by mouth every 6 (six) hours.   Pancrelipase (Lip-Prot-Amyl) 24000-76000 units Cpep Commonly known as:  CREON Take 3 capsules (72,000 Units total) by mouth 3 (three) times daily before meals.   potassium chloride 10 MEQ tablet Commonly known as:  K-DUR,KLOR-CON Take 1 tablet (10 mEq total) by mouth every other day.   SUMAtriptan 50 MG tablet Commonly known as:  IMITREX Take 1 tablet (50 mg total) by mouth every 2 (two) hours as needed for migraine. May repeat in 2 hours if headache persists or recurs.   traZODone 50 MG tablet Commonly known as:  DESYREL Take 1-2 tablets (50-100 mg total) by mouth at bedtime.          Objective:   Physical Exam  Abdominal:      BP (!) 164/98 (BP Location: Left Arm, Patient Position: Sitting, Cuff Size: Normal)   Pulse 98   Temp 98.1 F (36.7 C) (Oral)   Resp 16   Ht 5' 5.5" (1.664 m)   Wt 264 lb 6 oz (119.9 kg)   SpO2 92%   BMI 43.33 kg/m  General:  Well developed, overweight appearing, seems to be in moderate-to-severe distress due to pain, holding her right upper quadrant with her right hand. HEENT:  Normocephalic . Face symmetric, atraumatic Neck and chest wall: No subcutaneous crepitus Lungs:  CTA B Normal respiratory effort, no intercostal retractions, no accessory muscle use. Heart: RRR,  no murmur.  no pretibial edema bilaterally  Abdomen:  Not distended, see graphic, abdomen per se is soft, nontender. Skin: Not pale. Not jaundice Neurologic:  alert & oriented X3.  Speech normal, gait appropriate for age and unassisted Psych--  Cognition and judgment appear intact.  Cooperative with normal attention span and concentration.  Behavior appropriate. No anxious or depressed appearing.    Assessment & Plan:   36 year old lady with multiple medical problems including HTN, migraines, sleep apnea, asthma, IBS, increase LFTs, DJD, morbid obesity, on birth control pills presents w/:   Right upper quadrant pain: Developed intense pain after a liver biopsy, CT yesterday at the emergency room was reassuring however today she continue with severe pain at the RUQ associated with shoulder pain. fortunately, the lung,cardiac and abdominal exam is benign other than being TTP at the site of the biopsy Situation discussed with radiology, they recommend anti-inflammatory therapy. Toradol 30 IM, start Toradol by mouth later tonight . We'll get a chest x-ray just to be sure there is no PTX and a CBC to be sure there is no significant drop of hemoglobin. Call next week if no better. ER if symptoms severe. See instructions. Declined a prescription for oxycodone. She took it last night and didn't help.

## 2017-03-02 NOTE — Patient Instructions (Addendum)
GO TO THE LAB : Get the blood work     STOP BY THE FIRST FLOOR:  get the XR   Stop meloxicam for several days Instead take Toradol 10 mg 4 times a day as needed for the next 5 days. Take it with food to prevent stomach irritation  Call the office next week if  Not gradually improving  Go to the ER anytime if you have severe pain, difficulty breathing, fever, chills, stomach pain, constipation, any rash.

## 2017-03-02 NOTE — ED Notes (Signed)
Patient transported to CT 

## 2017-03-04 ENCOUNTER — Emergency Department (HOSPITAL_BASED_OUTPATIENT_CLINIC_OR_DEPARTMENT_OTHER): Payer: Managed Care, Other (non HMO)

## 2017-03-04 ENCOUNTER — Encounter (HOSPITAL_BASED_OUTPATIENT_CLINIC_OR_DEPARTMENT_OTHER): Payer: Self-pay | Admitting: *Deleted

## 2017-03-04 ENCOUNTER — Emergency Department (HOSPITAL_BASED_OUTPATIENT_CLINIC_OR_DEPARTMENT_OTHER)
Admission: EM | Admit: 2017-03-04 | Discharge: 2017-03-04 | Disposition: A | Discharge status: Home / Self Care | Payer: Managed Care, Other (non HMO) | Attending: Emergency Medicine | Admitting: Emergency Medicine

## 2017-03-04 DIAGNOSIS — Z87891 Personal history of nicotine dependence: Secondary | ICD-10-CM | POA: Insufficient documentation

## 2017-03-04 DIAGNOSIS — I129 Hypertensive chronic kidney disease with stage 1 through stage 4 chronic kidney disease, or unspecified chronic kidney disease: Secondary | ICD-10-CM | POA: Diagnosis not present

## 2017-03-04 DIAGNOSIS — R0602 Shortness of breath: Secondary | ICD-10-CM | POA: Diagnosis present

## 2017-03-04 DIAGNOSIS — J40 Bronchitis, not specified as acute or chronic: Secondary | ICD-10-CM

## 2017-03-04 DIAGNOSIS — J209 Acute bronchitis, unspecified: Secondary | ICD-10-CM | POA: Insufficient documentation

## 2017-03-04 DIAGNOSIS — J45909 Unspecified asthma, uncomplicated: Secondary | ICD-10-CM | POA: Insufficient documentation

## 2017-03-04 DIAGNOSIS — N189 Chronic kidney disease, unspecified: Secondary | ICD-10-CM | POA: Diagnosis not present

## 2017-03-04 DIAGNOSIS — Z79899 Other long term (current) drug therapy: Secondary | ICD-10-CM | POA: Diagnosis not present

## 2017-03-04 DIAGNOSIS — K589 Irritable bowel syndrome without diarrhea: Secondary | ICD-10-CM | POA: Insufficient documentation

## 2017-03-04 LAB — CBC WITH DIFFERENTIAL/PLATELET
Basophils Absolute: 0.1 10*3/uL (ref 0.0–0.1)
Basophils Relative: 0 %
Eosinophils Absolute: 0.3 10*3/uL (ref 0.0–0.7)
Eosinophils Relative: 2 %
HCT: 35.7 % — ABNORMAL LOW (ref 36.0–46.0)
Hemoglobin: 11.9 g/dL — ABNORMAL LOW (ref 12.0–15.0)
Lymphocytes Relative: 22 %
Lymphs Abs: 3.5 10*3/uL (ref 0.7–4.0)
MCH: 27.8 pg (ref 26.0–34.0)
MCHC: 33.3 g/dL (ref 30.0–36.0)
MCV: 83.4 fL (ref 78.0–100.0)
Monocytes Absolute: 0.9 10*3/uL (ref 0.1–1.0)
Monocytes Relative: 6 %
Neutro Abs: 11.2 10*3/uL — ABNORMAL HIGH (ref 1.7–7.7)
Neutrophils Relative %: 70 %
Platelets: 252 10*3/uL (ref 150–400)
RBC: 4.28 MIL/uL (ref 3.87–5.11)
RDW: 14.2 % (ref 11.5–15.5)
WBC: 15.9 10*3/uL — ABNORMAL HIGH (ref 4.0–10.5)

## 2017-03-04 LAB — COMPREHENSIVE METABOLIC PANEL
ALT: 94 U/L — ABNORMAL HIGH (ref 14–54)
AST: 81 U/L — ABNORMAL HIGH (ref 15–41)
Albumin: 3.4 g/dL — ABNORMAL LOW (ref 3.5–5.0)
Alkaline Phosphatase: 102 U/L (ref 38–126)
Anion gap: 12 (ref 5–15)
BUN: 8 mg/dL (ref 6–20)
CO2: 23 mmol/L (ref 22–32)
Calcium: 8.6 mg/dL — ABNORMAL LOW (ref 8.9–10.3)
Chloride: 101 mmol/L (ref 101–111)
Creatinine, Ser: 0.82 mg/dL (ref 0.44–1.00)
GFR calc Af Amer: >60 mL/min (ref >60)
GFR calc non Af Amer: >60 mL/min (ref >60)
Glucose, Bld: 142 mg/dL — ABNORMAL HIGH (ref 65–99)
Potassium: 3.6 mmol/L (ref 3.5–5.1)
Sodium: 136 mmol/L (ref 135–145)
Total Bilirubin: 0.5 mg/dL (ref 0.3–1.2)
Total Protein: 7.2 g/dL (ref 6.5–8.1)

## 2017-03-04 LAB — URINALYSIS, ROUTINE W REFLEX MICROSCOPIC
Bilirubin Urine: NEGATIVE
Glucose, UA: NEGATIVE mg/dL
Hgb urine dipstick: NEGATIVE
Ketones, ur: NEGATIVE mg/dL
Nitrite: NEGATIVE
Protein, ur: NEGATIVE mg/dL
Specific Gravity, Urine: 1.015 (ref 1.005–1.030)
pH: 5.5 (ref 5.0–8.0)

## 2017-03-04 LAB — URINALYSIS, MICROSCOPIC (REFLEX): RBC / HPF: NONE SEEN RBC/hpf (ref 0–5)

## 2017-03-04 LAB — PREGNANCY, URINE: Preg Test, Ur: NEGATIVE

## 2017-03-04 MED ORDER — DOXYCYCLINE HYCLATE 100 MG PO TABS
100.0000 mg | ORAL_TABLET | Freq: Once | ORAL | Status: AC
  Administered 2017-03-04: 100 mg via ORAL
  Filled 2017-03-04: qty 1

## 2017-03-04 MED ORDER — ALBUTEROL SULFATE HFA 108 (90 BASE) MCG/ACT IN AERS
INHALATION_SPRAY | RESPIRATORY_TRACT | Status: AC
  Administered 2017-03-04: 2
  Filled 2017-03-04: qty 6.7

## 2017-03-04 MED ORDER — PREDNISONE 50 MG PO TABS
60.0000 mg | ORAL_TABLET | Freq: Once | ORAL | Status: AC
  Administered 2017-03-04: 16:00:00 60 mg via ORAL
  Filled 2017-03-04: qty 1

## 2017-03-04 MED ORDER — SODIUM CHLORIDE 0.9 % IV BOLUS (SEPSIS)
1000.0000 mL | Freq: Once | INTRAVENOUS | Status: AC
  Administered 2017-03-04: 1000 mL via INTRAVENOUS

## 2017-03-04 MED ORDER — DOXYCYCLINE HYCLATE 100 MG PO CAPS: 100.0000 mg | ORAL_CAPSULE | Freq: Two times a day (BID) | ORAL | 0 refills | Status: DC

## 2017-03-04 MED ORDER — BENZONATATE 100 MG PO CAPS: 100.0000 mg | ORAL_CAPSULE | Freq: Three times a day (TID) | ORAL | 0 refills | Status: DC

## 2017-03-04 MED ORDER — IOPAMIDOL (ISOVUE-300) INJECTION 61%
100.0000 mL | Freq: Once | INTRAVENOUS | Status: AC | PRN
  Administered 2017-03-04: 100 mL via INTRAVENOUS

## 2017-03-04 MED ORDER — PREDNISONE 50 MG PO TABS: 50.0000 mg | ORAL_TABLET | Freq: Every day | ORAL | 0 refills | Status: DC

## 2017-03-04 MED ORDER — ALBUTEROL SULFATE (2.5 MG/3ML) 0.083% IN NEBU
5.0000 mg | INHALATION_SOLUTION | Freq: Once | RESPIRATORY_TRACT | Status: AC
  Administered 2017-03-04: 5 mg via RESPIRATORY_TRACT
  Filled 2017-03-04: qty 6

## 2017-03-04 NOTE — ED Notes (Signed)
Patient transported to CT 

## 2017-03-04 NOTE — ED Notes (Signed)
Patient transported to X-ray 

## 2017-03-04 NOTE — ED Notes (Signed)
Pt on auto Vs and cardiac monitor.

## 2017-03-04 NOTE — Discharge Instructions (Signed)
Medications: Doxycycline, prednisone, Tessalon, albuterol inhaler  Treatment: Take doxycycline twice daily for 10 days. Take prednisone once daily for the next 4 days. Take Tessalon every 8 hours as needed for cough. Continue taking Tylenol as prescribed over-the-counter, as needed for fever. Use 1-2 puffs of inhaler every 4-6 hours as needed for shortness of breath or wheezing.  Follow-up: Please return to the emergency department if you develop any new or worsening symptoms including worsening shortness of breath, cough, feeling like you are going to pass out, or any other new or concerning symptoms.

## 2017-03-04 NOTE — ED Triage Notes (Signed)
Pt reports sob xseveral days. Reports associated cough and low-grade. Reports some nausea, denies v/d.

## 2017-03-04 NOTE — ED Provider Notes (Signed)
[facility] DEPT [facility] Provider Note   CSN: [id number] Arrival date & time: 03/04/17  1251     History   Chief Complaint Chief Complaint  Patient presents with  . Shortness of Breath    HPI Stephanie Miles is a 36 y.o. female with recent history of liver biopsy who presents with a four-day history of cough and shortness of breath. Patient had a liver biopsy on 03/01/2017. Patient reports her symptoms have progressed since that time. She has had a fever up to 101 at home. She has had a productive cough with yellow sputum. Reports a burning pain in her upper chest associated with her symptoms. Patient has taken Tylenol for her fever, but no other medications outside her normal regimen. Patient reports her post operative pain has improved, however her typical IBS pain is a little worse than normal today. Patient feels this is due to being sick. Patient denies any nausea or vomiting, but does note her diarrhea has been a little more than normal related to her  HPI  Past Medical History:  Diagnosis Date  . Allergy   . Anxiety   . Aortic regurgitation due to bicuspid aortic valve   . Arthritis   . Asthma    Patient denies  . Borderline diabetes 10/20/2016  . Chronic kidney disease    kidney stones  . Depression   . Family history of breast cancer in female   . Family history of colon cancer   . Fatty liver 11/28/2016  . GERD (gastroesophageal reflux disease)   . Heart murmur   . Hypertension   . IBS (irritable bowel syndrome)   . Insomnia   . Migraine headache   . OCD (obsessive compulsive disorder)   . Orbital cellulitis    at age 69 was hospitalized for 3 months (almost died)   . OSA (obstructive sleep apnea)   . S/P appy 05/2013  . Sleep apnea     Patient Active Problem List   Diagnosis Date Noted  . Genetic testing 02/28/2017  . Family history of breast cancer in female   . Family history of colon cancer   . Elevated LFTs 01/11/2017  . RUQ abdominal pain 01/11/2017  .  PTSD (post-traumatic stress disorder) 12/28/2016  . Primary osteoarthritis of both knees 12/28/2016  . ANA positive 12/28/2016  . Leukocytosis 12/06/2016  . Fatty liver 11/28/2016  . History of kidney stones 10/21/2016  . Osteoarthritis 10/21/2016  . Borderline diabetes 10/20/2016  . Cough variant asthma vs uacs  07/07/2015  . Severe obesity (BMI >= 40) ([place]) 07/07/2015  . Dyspnea 07/07/2015  . Asthma 05/27/2015  . Aortic regurgitation due to bicuspid aortic valve 03/12/2014  . Personal history of tobacco use, presenting hazards to health 01/20/2014  . Anxiety state 10/16/2013  . Migraine 07/08/2013  . Sleep apnea 07/08/2013  . IBS (irritable bowel syndrome) 06/06/2013  . GERD (gastroesophageal reflux disease) 06/06/2013  . HTN (hypertension) 06/06/2013    Past Surgical History:  Procedure Laterality Date  . COLONOSCOPY    . DENTAL SURGERY     wisdom teeth  . ESOPHAGOGASTRODUODENOSCOPY    . LAPAROSCOPIC APPENDECTOMY N/A 06/06/2013   Procedure: APPENDECTOMY LAPAROSCOPIC;  Surgeon: Imogene Burn. Georgette Dover, MD;  Location: [facility];  Service: General;  Laterality: N/A;    OB History    No data available       Home Medications    Prior to Admission medications   Medication Sig Start Date End Date Taking? Authorizing Provider  ACCU-CHEK  SOFTCLIX LANCETS lancets Use as instructed 01/09/17  Yes Debbrah Alar, NP  Blood Glucose Monitoring Suppl (ACCU-CHEK GUIDE) w/Device KIT 1 each by Does not apply route daily. 01/09/17  Yes Debbrah Alar, NP  clotrimazole-betamethasone (LOTRISONE) cream Apply 1 application topically 2 (two) times daily. Patient taking differently: Apply 1 application topically 2 (two) times daily as needed.  11/21/16  Yes Debbrah Alar, NP  Diclofenac Sodium CR (VOLTAREN-XR) 100 MG 24 hr tablet Take 1 tablet (100 mg total) by mouth daily. 03/02/17  Yes Palumbo, April, MD  dicyclomine (BENTYL) 20 MG tablet TAKE 1 TABLET BY MOUTH 4 TIMES DAILY BEFORE MEALS  AND AT BEDTIME 10/17/16  Yes Debbrah Alar, NP  escitalopram (LEXAPRO) 10 MG tablet Take 1 tablet (10 mg total) by mouth daily. 11/21/16  Yes Debbrah Alar, NP  fluticasone (FLONASE) 50 MCG/ACT nasal spray Place 2 sprays into both nostrils daily as needed for allergies.    Yes [provider]  furosemide (LASIX) 40 MG tablet Take 1 tablet (40 mg total) by mouth every morning. 10/17/16  Yes Debbrah Alar, NP  glucose blood (ACCU-CHEK GUIDE) test strip Use as instructed to check blood sugar once a day. 01/09/17  Yes Debbrah Alar, NP  ketorolac (TORADOL) 10 MG tablet Take 1 tablet (10 mg total) by mouth every 6 (six) hours as needed. 03/02/17  Yes Paz, Alda Berthold, MD  losartan (COZAAR) 100 MG tablet Take 1 tablet (100 mg total) by mouth daily. 10/17/16  Yes Debbrah Alar, NP  metoprolol succinate (TOPROL-XL) 50 MG 24 hr tablet Take 1 tablet (50 mg total) by mouth daily. Take with or immediately following a meal. 12/08/16  Yes Debbrah Alar, NP  mirtazapine (REMERON) 15 MG tablet Take 1 tablet (15 mg total) by mouth at bedtime. 10/17/16  Yes Debbrah Alar, NP  Multiple Vitamins-Minerals (MULTIVITAMIN WITH MINERALS) tablet Take 1 tablet by mouth daily.   Yes [provider]  norgestimate-ethinyl estradiol (ORTHO-CYCLEN,SPRINTEC,PREVIFEM) 0.25-35 MG-MCG tablet Take 1 tablet by mouth every evening. 10/17/16  Yes Debbrah Alar, NP  omeprazole (PRILOSEC) 20 MG capsule Take 56m before breakfast and 25mbefore supper. 10/17/16  Yes O'Debbrah AlarNP  ondansetron (ZOFRAN) 4 MG tablet Take 1 tablet (4 mg total) by mouth every 6 (six) hours. Patient taking differently: Take 4 mg by mouth every 8 (eight) hours as needed for nausea or vomiting.  02/21/17  Yes Pfeiffer, MaJeannie DoneMD  potassium chloride (K-DUR,KLOR-CON) 10 MEQ tablet Take 1 tablet (10 mEq total) by mouth every other day. 10/17/16  Yes O'Debbrah AlarNP  SUMAtriptan (IMITREX) 50 MG tablet Take  1 tablet (50 mg total) by mouth every 2 (two) hours as needed for migraine. May repeat in 2 hours if headache persists or recurs. 10/17/16  Yes O'Debbrah AlarNP  traZODone (DESYREL) 50 MG tablet Take 1-2 tablets (50-100 mg total) by mouth at bedtime. Patient taking differently: Take 50-100 mg by mouth at bedtime as needed.  10/17/16  Yes O'Debbrah AlarNP  benzonatate (TESSALON) 100 MG capsule Take 1 capsule (100 mg total) by mouth every 8 (eight) hours. 03/04/17   Eudora Guevarra, AlBea GraffPA-C  doxycycline (VIBRAMYCIN) 100 MG capsule Take 1 capsule (100 mg total) by mouth 2 (two) times daily. 03/04/17   Eliana Lueth, AlBea GraffPA-C  meloxicam (MOBIC) 15 MG tablet TAKE ONE TABLET BY MOUTH DAILY FOR 2 WEEKS THEN AS NEEDED TAKE WITH FOOD 01/23/17   XuLeandrew KoyanagiMD  Pancrelipase, Lip-Prot-Amyl, (CREON) 24000-76000 units CPEP Take 3 capsules (72,000 Units  total) by mouth 3 (three) times daily before meals. 12/12/16   [place], Holli Humbles, NP  predniSONE (DELTASONE) 50 MG tablet Take 1 tablet (50 mg total) by mouth daily with breakfast. 03/04/17   Frederica Kuster, PA-C    Family History Family History  Problem Relation Age of Onset  . Asthma Mother   . Hypertension Mother   . Breast cancer Father 54  . Diabetes Father   . Hypertension Father   . Heart disease Father   . Hyperlipidemia Father   . Asthma Sister   . Hyperlipidemia Brother   . Hypertension Brother   . Vision loss Brother   . Colon cancer Maternal Grandmother 48  . Liver cancer Maternal Grandmother   . Cervical cancer Maternal Aunt   . Stroke Paternal Uncle 31  . Melanoma Maternal Grandfather 37  . Colon polyps Maternal Grandfather   . AAA (abdominal aortic aneurysm) Paternal Grandmother   . Heart disease Paternal Grandfather   . Heart disease Paternal Uncle 25  . Colon cancer Other 15       MGM's mother  . Stomach cancer Other 52       MGM's mother  . Breast cancer Other        MGM's maternal aunt  . Cancer Other         MGM's maternal uncle with GI cancer  . Esophageal cancer Neg Hx   . Pancreatic cancer Neg Hx   . Rectal cancer Neg Hx     Social History Social History  Substance Use Topics  . Smoking status: Former Smoker    Packs/day: 1.00    Years: 20.00    Types: Cigarettes    Quit date: 02/22/2013  . Smokeless tobacco: Never Used  . Alcohol use 0.0 oz/week     Comment: social     Allergies   Codeine; Adhesive [tape]; and Amlodipine   Review of Systems Review of Systems   Physical Exam Updated Vital Signs BP 127/82   Pulse 96   Temp 99.2 F (37.3 C) (Oral)   Resp 17   Ht 5' 5.5" (1.664 m)   Wt 119.7 kg (264 lb)   SpO2 94%   BMI 43.26 kg/m   Physical Exam   ED Treatments / Results  Labs (all labs ordered are listed, but only abnormal results are displayed) Labs Reviewed  COMPREHENSIVE METABOLIC PANEL - Abnormal; Notable for the following:       Result Value   Glucose, Bld 142 (*)    Calcium 8.6 (*)    Albumin 3.4 (*)    AST 81 (*)    ALT 94 (*)    All other components within normal limits  CBC WITH DIFFERENTIAL/PLATELET - Abnormal; Notable for the following:    WBC 15.9 (*)    Hemoglobin 11.9 (*)    HCT 35.7 (*)    Neutro Abs 11.2 (*)    All other components within normal limits  URINALYSIS, ROUTINE W REFLEX MICROSCOPIC - Abnormal; Notable for the following:    Leukocytes, UA TRACE (*)    All other components within normal limits  URINALYSIS, MICROSCOPIC (REFLEX) - Abnormal; Notable for the following:    Bacteria, UA MANY (*)    Squamous Epithelial / LPF 0-5 (*)    All other components within normal limits  PREGNANCY, URINE    EKG  EKG Interpretation None       Radiology Dg Chest 2 View  Result Date: 03/04/2017 CLINICAL DATA:  Cough, congestion,  fever, chest pain for 1 week. EXAM: CHEST  2 VIEW COMPARISON:  03/02/2017 FINDINGS: Normal heart size. Lungs clear. No pneumothorax. No pleural effusion. IMPRESSION: No active cardiopulmonary disease.  Electronically Signed   By: Marybelle Killings M.D.   On: 03/04/2017 13:41   Ct Abdomen Pelvis W Contrast  Result Date: 03/04/2017 CLINICAL DATA:  Liver biopsy on 03/01/2017 for elevated liver enzymes, history of fatty liver, began experiencing shortness of breath, low-grade fever, nausea, and shoulder pain EXAM: CT ABDOMEN AND PELVIS WITH CONTRAST TECHNIQUE: Multidetector CT imaging of the abdomen and pelvis was performed using the standard protocol following bolus administration of intravenous contrast. Sagittal and coronal MPR images reconstructed from axial data set. CONTRAST:  119m ISOVUE-300 IOPAMIDOL (ISOVUE-300) INJECTION 61% IV. Oral contrast was not administered. COMPARISON:  03/02/2017 FINDINGS: Lower chest: Subsegmental atelectasis at both lung bases. Hepatobiliary: Diffuse fatty infiltration of liver. Poorly defined area of low-attenuation obliquely at the anterolateral liver may reflect prior biopsy tract. No other focal hepatic abnormalities. Gallbladder unremarkable. Pancreas: Normal appearance Spleen: Normal appearance Adrenals/Urinary Tract: Small peripelvic cyst upper pole LEFT kidney. Tiny nonobstructing RIGHT renal calculus. Adrenal glands, kidneys, ureters, and bladder otherwise normal appearance Stomach/Bowel: Prior appendectomy. Stomach and bowel loops normal appearance. Vascular/Lymphatic: Normal appearance Reproductive: Uterus and adnexa unremarkable. Other: No free air or free fluid. No hernia or acute inflammatory process. No postprocedural hemorrhage or abnormal fluid collection in the RIGHT upper quadrant. Musculoskeletal: Unremarkable IMPRESSION: Fatty infiltration of liver. Probable visualization of the biopsy tract at the anterolateral liver. Tiny nonobstructing RIGHT renal calculus. No acute intra-abdominal or intrapelvic abnormalities. Electronically Signed   By: MLavonia DanaM.D.   On: 03/04/2017 15:03    Procedures Procedures (including critical care time)  Medications  Ordered in ED Medications  albuterol (PROVENTIL) (2.5 MG/3ML) 0.083% nebulizer solution 5 mg (5 mg Nebulization Given 03/04/17 1334)  sodium chloride 0.9 % bolus 1,000 mL (0 mLs Intravenous Stopped 03/04/17 1636)  iopamidol (ISOVUE-300) 61 % injection 100 mL (100 mLs Intravenous Contrast Given 03/04/17 1443)  predniSONE (DELTASONE) tablet 60 mg (60 mg Oral Given 03/04/17 1620)  doxycycline (VIBRA-TABS) tablet 100 mg (100 mg Oral Given 03/04/17 1620)     Initial Impression / Assessment and Plan / ED Course  I have reviewed the triage vital signs and the nursing notes.  Pertinent labs & imaging results that were available during my care of the patient were reviewed by me and considered in my medical decision making (see chart for details).     Patient with suspected bronchitis, however considering fever, mildly elevated blood cell count from last result 2 days ago, we'll cover with doxycycline. Otherwise labs stable. CXR shows no active cardiopulmonary disease. CT abdomen pelvis is stable and shows probable visualization of the biopsy tract of the liver, no other acute findings such as subdiaphragmatic abscess causing patient's symptoms. Patient feeling better after albuterol nebulizer in the ED. Patient ambulatory at 93-96% on room air prior to discharge. We'll discharge home with doxycycline, 5 day burst of prednisone, Tessalon, and albuterol inhaler. Follow-up to PCP in 2-3 days for recheck. Return precautions discussed. Patient understands and agrees with plan. Patient vitals stable throughout ED course and discharged in satisfactory condition. Patient also evaluated by Dr. IEllender Hosewho guided the patient's management and agrees with plan.  Final Clinical Impressions(s) / ED Diagnoses   Final diagnoses:  Bronchitis    New Prescriptions New Prescriptions   BENZONATATE (TESSALON) 100 MG CAPSULE    Take 1 capsule (100 mg  total) by mouth every 8 (eight) hours.   DOXYCYCLINE (VIBRAMYCIN) 100 MG  CAPSULE    Take 1 capsule (100 mg total) by mouth 2 (two) times daily.   PREDNISONE (DELTASONE) 50 MG TABLET    Take 1 tablet (50 mg total) by mouth daily with breakfast.     Frederica Kuster, PA-C 03/04/17 1639    Duffy Bruce, MD 03/05/17 3750    Duffy Bruce, MD 03/05/17 [phone]

## 2017-03-05 ENCOUNTER — Other Ambulatory Visit (INDEPENDENT_AMBULATORY_CARE_PROVIDER_SITE_OTHER): Payer: Self-pay | Admitting: Orthopaedic Surgery

## 2017-03-06 ENCOUNTER — Other Ambulatory Visit: Payer: Self-pay

## 2017-03-06 DIAGNOSIS — K760 Fatty (change of) liver, not elsewhere classified: Secondary | ICD-10-CM

## 2017-03-10 ENCOUNTER — Other Ambulatory Visit (HOSPITAL_BASED_OUTPATIENT_CLINIC_OR_DEPARTMENT_OTHER): Payer: Managed Care, Other (non HMO)

## 2017-03-10 ENCOUNTER — Ambulatory Visit (HOSPITAL_BASED_OUTPATIENT_CLINIC_OR_DEPARTMENT_OTHER): Payer: Managed Care, Other (non HMO) | Admitting: Family

## 2017-03-10 ENCOUNTER — Encounter: Payer: Self-pay | Admitting: *Deleted

## 2017-03-10 VITALS — BP 134/82 | HR 96 | Temp 98.3°F | Resp 20 | Wt 256.8 lb

## 2017-03-10 DIAGNOSIS — D509 Iron deficiency anemia, unspecified: Secondary | ICD-10-CM

## 2017-03-10 DIAGNOSIS — D508 Other iron deficiency anemias: Secondary | ICD-10-CM

## 2017-03-10 DIAGNOSIS — D72828 Other elevated white blood cell count: Secondary | ICD-10-CM

## 2017-03-10 DIAGNOSIS — E538 Deficiency of other specified B group vitamins: Secondary | ICD-10-CM

## 2017-03-10 DIAGNOSIS — J40 Bronchitis, not specified as acute or chronic: Secondary | ICD-10-CM

## 2017-03-10 DIAGNOSIS — D72829 Elevated white blood cell count, unspecified: Secondary | ICD-10-CM

## 2017-03-10 LAB — CBC WITH DIFFERENTIAL (CANCER CENTER ONLY)
BASO#: 0.1 10*3/uL (ref 0.0–0.2)
BASO%: 0.5 % (ref 0.0–2.0)
EOS%: 3.7 % (ref 0.0–7.0)
Eosinophils Absolute: 0.7 10*3/uL — ABNORMAL HIGH (ref 0.0–0.5)
HEMATOCRIT: 38.9 % (ref 34.8–46.6)
HGB: 13.1 g/dL (ref 11.6–15.9)
LYMPH#: 7.4 10*3/uL — AB (ref 0.9–3.3)
LYMPH%: 38.8 % (ref 14.0–48.0)
MCH: 28.3 pg (ref 26.0–34.0)
MCHC: 33.7 g/dL (ref 32.0–36.0)
MCV: 84 fL (ref 81–101)
MONO#: 1 10*3/uL — ABNORMAL HIGH (ref 0.1–0.9)
MONO%: 5.4 % (ref 0.0–13.0)
NEUT%: 51.6 % (ref 39.6–80.0)
NEUTROS ABS: 9.8 10*3/uL — AB (ref 1.5–6.5)
Platelets: 437 10*3/uL — ABNORMAL HIGH (ref 145–400)
RBC: 4.63 10*6/uL (ref 3.70–5.32)
RDW: 13.8 % (ref 11.1–15.7)
WBC: 19 10*3/uL — ABNORMAL HIGH (ref 3.9–10.0)

## 2017-03-10 LAB — COMPREHENSIVE METABOLIC PANEL (CC13)
ALBUMIN: 3.7 g/dL (ref 3.5–5.5)
ALK PHOS: 120 IU/L — AB (ref 39–117)
ALT: 209 IU/L — AB (ref 0–32)
AST: 158 IU/L — AB (ref 0–40)
Albumin/Globulin Ratio: 0.9 — ABNORMAL LOW (ref 1.2–2.2)
BILIRUBIN TOTAL: 0.2 mg/dL (ref 0.0–1.2)
BUN / CREAT RATIO: 16 (ref 9–23)
BUN: 15 mg/dL (ref 6–20)
CHLORIDE: 104 mmol/L (ref 96–106)
CO2: 24 mmol/L (ref 20–29)
CREATININE: 0.92 mg/dL (ref 0.57–1.00)
Calcium, Ser: 8.9 mg/dL (ref 8.7–10.2)
GFR calc Af Amer: 91 mL/min/{1.73_m2} (ref >59)
GFR calc non Af Amer: 79 mL/min/{1.73_m2} (ref >59)
Globulin, Total: 4 g/dL (ref 1.5–4.5)
Glucose: 115 mg/dL — ABNORMAL HIGH (ref 65–99)
Potassium, Ser: 3.9 mmol/L (ref 3.5–5.2)
Sodium: 139 mmol/L (ref 134–144)
Total Protein: 7.7 g/dL (ref 6.0–8.5)

## 2017-03-10 LAB — TECHNOLOGIST REVIEW CHCC SATELLITE

## 2017-03-10 LAB — CHCC SATELLITE - SMEAR

## 2017-03-10 NOTE — Progress Notes (Signed)
Hematology and Oncology Follow Up Visit  Stephanie Miles [id number] 13-Dec-1980 36 y.o. 03/10/2017   Principle Diagnosis:  Leukocytosis - reactive Arthritis   Current Therapy:   Observation   Interim History:  Stephanie Miles is here today for follow-up. She is currently getting over a bad bout with bronchitis. She is symptomatic with fatigue, weakness, SOB with exertion and dry cough. She finished prednisone 2 days ago and using her inhaler as needed. She is also on doxycycline. Her WBC count is still elevated at 19.0 with the infection and steroids.  She has followed up with rheumatology and there have been no new changes.  She has no fever, chills, n/v, rash, dizziness, chest pain, palpitations, abdominal pain or changes in bowel or bladder habits.  Her IBS has been a little worse while on the antibiotics.  No swelling, tenderness, numbness or tingling in her extremities at this time. No c/o pain at this time.  She has maintained a good appetite and is staying well hydrated. Her weight is stable.   ECOG Performance Status: 1 - Symptomatic but completely ambulatory  Medications:  Allergies as of 03/10/2017      Reactions   Codeine Nausea Only   Needs antiemetic   Adhesive [tape]    Tapes, bandaids   Amlodipine Other (See Comments)   EDEMA      Medication List       Accurate as of 03/10/17  3:31 PM. Always use your most recent med list.          ACCU-CHEK GUIDE w/Device Kit 1 each by Does not apply route daily.   ACCU-CHEK SOFTCLIX LANCETS lancets Use as instructed   benzonatate 100 MG capsule Commonly known as:  TESSALON Take 1 capsule (100 mg total) by mouth every 8 (eight) hours.   clotrimazole-betamethasone cream Commonly known as:  LOTRISONE Apply 1 application topically 2 (two) times daily.   Diclofenac Sodium CR 100 MG 24 hr tablet Commonly known as:  VOLTAREN-XR Take 1 tablet (100 mg total) by mouth daily.   dicyclomine 20 MG tablet Commonly known as:   BENTYL TAKE 1 TABLET BY MOUTH 4 TIMES DAILY BEFORE MEALS AND AT BEDTIME   doxycycline 100 MG capsule Commonly known as:  VIBRAMYCIN Take 1 capsule (100 mg total) by mouth 2 (two) times daily.   escitalopram 10 MG tablet Commonly known as:  LEXAPRO Take 1 tablet (10 mg total) by mouth daily.   fluticasone 50 MCG/ACT nasal spray Commonly known as:  FLONASE Place 2 sprays into both nostrils daily as needed for allergies.   furosemide 40 MG tablet Commonly known as:  LASIX Take 1 tablet (40 mg total) by mouth every morning.   glucose blood test strip Commonly known as:  ACCU-CHEK GUIDE Use as instructed to check blood sugar once a day.   ketorolac 10 MG tablet Commonly known as:  TORADOL Take 1 tablet (10 mg total) by mouth every 6 (six) hours as needed.   losartan 100 MG tablet Commonly known as:  COZAAR Take 1 tablet (100 mg total) by mouth daily.   meloxicam 15 MG tablet Commonly known as:  MOBIC TAKE ONE TABLET BY MOUTH DAILY FOR 2 WEEKS THEN AS NEEDED WITH FOOD   metoprolol succinate 50 MG 24 hr tablet Commonly known as:  TOPROL-XL Take 1 tablet (50 mg total) by mouth daily. Take with or immediately following a meal.   mirtazapine 15 MG tablet Commonly known as:  REMERON Take 1 tablet (15 mg total) by  mouth at bedtime.   multivitamin with minerals tablet Take 1 tablet by mouth daily.   norgestimate-ethinyl estradiol 0.25-35 MG-MCG tablet Commonly known as:  ORTHO-CYCLEN,SPRINTEC,PREVIFEM Take 1 tablet by mouth every evening.   omeprazole 20 MG capsule Commonly known as:  PRILOSEC Take 31m before breakfast and 258mbefore supper.   ondansetron 4 MG tablet Commonly known as:  ZOFRAN Take 1 tablet (4 mg total) by mouth every 6 (six) hours.   Pancrelipase (Lip-Prot-Amyl) 24000-76000 units Cpep Commonly known as:  CREON Take 3 capsules (72,000 Units total) by mouth 3 (three) times daily before meals.   potassium chloride 10 MEQ tablet Commonly known as:   K-DUR,KLOR-CON Take 1 tablet (10 mEq total) by mouth every other day.   predniSONE 50 MG tablet Commonly known as:  DELTASONE Take 1 tablet (50 mg total) by mouth daily with breakfast.   SUMAtriptan 50 MG tablet Commonly known as:  IMITREX Take 1 tablet (50 mg total) by mouth every 2 (two) hours as needed for migraine. May repeat in 2 hours if headache persists or recurs.   traZODone 50 MG tablet Commonly known as:  DESYREL Take 1-2 tablets (50-100 mg total) by mouth at bedtime.       Allergies:  Allergies  Allergen Reactions  . Codeine Nausea Only    Needs antiemetic  . Adhesive [Tape]     Tapes, bandaids  . Amlodipine Other (See Comments)    EDEMA    Past Medical History, Surgical history, Social history, and Family History were reviewed and updated.  Review of Systems: All other 10 point review of systems is negative.   Physical Exam:  weight is 256 lb 12.8 oz (116.5 kg). Her oral temperature is 98.3 F (36.8 C). Her blood pressure is 134/82 and her pulse is 96. Her respiration is 20 and oxygen saturation is 96%.   Wt Readings from Last 3 Encounters:  03/10/17 256 lb 12.8 oz (116.5 kg)  03/04/17 264 lb (119.7 kg)  03/02/17 264 lb 6 oz (119.9 kg)    Ocular: Sclerae unicteric, pupils equal, round and reactive to light Ear-nose-throat: Oropharynx clear, dentition fair Lymphatic: No cervical, supraclavicular or axillary adenopathy Lungs course throughout and mild wheezing in both upper lobes, good excursion bilaterally Heart regular rate and rhythm, no murmur appreciated Abd soft, nontender, positive bowel sounds, no liver or spleen tip palpated on exam, no fluid wave MSK no focal spinal tenderness, no joint edema Neuro: non-focal, well-oriented, appropriate affect Breasts: Deferred   Lab Results  Component Value Date   WBC 19.0 (H) 03/10/2017   HGB 13.1 03/10/2017   HCT 38.9 03/10/2017   MCV 84 03/10/2017   PLT 437 (H) 03/10/2017   Lab Results  Component  Value Date   FERRITIN 77.5 01/11/2017   IRON 44 02/06/2017   TIBC 361 02/06/2017   UIBC 317 02/06/2017   IRONPCTSAT 12 (L) 02/06/2017   Lab Results  Component Value Date   RBC 4.63 03/10/2017   No results found for: KPNils PyleKASouth[facility] Asc LLCab Results  Component Value Date   IGA 195 01/11/2017   Lab Results  Component Value Date   TOTALPROTELP 6.9 12/28/2016   ALBUMINELP 3.6 (L) 12/28/2016   A1GS 0.4 (H) 12/28/2016   A2GS 0.9 12/28/2016   BETS 0.6 12/28/2016   BETA2SER 0.4 12/28/2016   GAMS 1.0 12/28/2016   SPEI SEE NOTE 12/28/2016     Chemistry      Component Value Date/Time   NA 136 03/04/2017 1345  NA 137 12/06/2016 1448   K 3.6 03/04/2017 1345   K 4.1 12/06/2016 1448   CL 101 03/04/2017 1345   CL 105 12/06/2016 1448   CO2 23 03/04/2017 1345   CO2 25 12/06/2016 1448   BUN 8 03/04/2017 1345   BUN 10 12/06/2016 1448   CREATININE 0.82 03/04/2017 1345   CREATININE 0.86 12/28/2016 0941      Component Value Date/Time   CALCIUM 8.6 (L) 03/04/2017 1345   CALCIUM 9.3 12/06/2016 1448   ALKPHOS 102 03/04/2017 1345   ALKPHOS 94 12/06/2016 1448   AST 81 (H) 03/04/2017 1345   AST 68 (H) 12/06/2016 1448   ALT 94 (H) 03/04/2017 1345   ALT 98 (H) 12/06/2016 1448   BILITOT 0.5 03/04/2017 1345   BILITOT 0.2 12/06/2016 1448      Impression and Plan: Ms. Wulf is a very pleasant 36 yo caucasian female with arthritis and reactive leukocytosis. She is also getting over bronchitis and has been on steroids and an antibiotic. She is slowly feeling better but still quite fatigued and weak.  We will see what her iron studies show and bring her back in next week for an infusion if needed.  We will go ahead and plan to see her back again in another 3 months for repeat lab work and follow-up.  She will contact our office with any questions or concerns. We can certainly see her sooner if need be.   Eliezer Bottom, NP 8/17/20183:31 PM

## 2017-03-13 ENCOUNTER — Other Ambulatory Visit: Payer: Self-pay

## 2017-03-13 DIAGNOSIS — K7581 Nonalcoholic steatohepatitis (NASH): Secondary | ICD-10-CM

## 2017-03-13 LAB — IRON AND TIBC
%SAT: 18 % — ABNORMAL LOW (ref 21–57)
Iron: 58 ug/dL (ref 41–142)
TIBC: 326 ug/dL (ref 236–444)
UIBC: 268 ug/dL (ref 120–384)

## 2017-03-13 LAB — FERRITIN: FERRITIN: 202 ng/mL (ref 9–269)

## 2017-03-16 ENCOUNTER — Encounter: Payer: Self-pay | Admitting: Internal Medicine

## 2017-03-16 ENCOUNTER — Telehealth: Payer: Self-pay

## 2017-03-16 ENCOUNTER — Ambulatory Visit (INDEPENDENT_AMBULATORY_CARE_PROVIDER_SITE_OTHER): Payer: Managed Care, Other (non HMO) | Admitting: Internal Medicine

## 2017-03-16 VITALS — BP 128/80 | HR 109 | Temp 98.0°F | Resp 14 | Ht 65.5 in | Wt 254.5 lb

## 2017-03-16 DIAGNOSIS — J45901 Unspecified asthma with (acute) exacerbation: Secondary | ICD-10-CM

## 2017-03-16 MED ORDER — PREDNISONE 10 MG PO TABS: ORAL_TABLET | ORAL | 0 refills | Status: DC

## 2017-03-16 MED ORDER — BUDESONIDE-FORMOTEROL FUMARATE 160-4.5 MCG/ACT IN AERO: 2.0000 | INHALATION_SPRAY | Freq: Two times a day (BID) | RESPIRATORY_TRACT | 3 refills | Status: DC

## 2017-03-16 NOTE — Patient Instructions (Signed)
Please come back and see Stephanie Miles in 2 week.  If you are not getting better in the next few days please call the office  Stop doxycycline, the antibiotic  ====  Rest, fluids , tylenol  For cough:  Take Mucinex DM twice a day as needed until better  If r nasal congestion: Use OTC  Flonase : 2 nasal sprays on each side of the nose in the morning until you feel better   Avoid decongestants such as  Pseudoephedrine or phenylephrine   Symbicort 2 puffs twice a day  Albuterol: As needed for cough and wheezing (rescue inhaler)  Take prednisone for a few days

## 2017-03-16 NOTE — Progress Notes (Signed)
Pre visit review using our clinic review tool, if applicable. No additional management support is needed unless otherwise documented below in the visit note. 

## 2017-03-16 NOTE — Progress Notes (Signed)
Subjective:    Patient ID: Stephanie Miles, female    DOB: 31-Oct-1980, 36 y.o.   MRN: [id number]  DOS:  03/16/2017 Type of visit - description :  Interval history:  Went to the ER 03/04/2017, had cough, wheezing and difficulty breathing. White count was elevated. Chest x-ray negative, pregnancy test negative, LFTs elevated (not a new problem), CT abdomen with no acute changes, no abscess. Was treated with albuterol, felt better. Was discharged on  Doxycycline and prednisone. Also on a inhaler and tessalon  Went to hematology 8- 17-18, they felt she was recuperating from bronchitis.   Review of Systems She is here, because although is better she is not completely well. Fever is actually gone. Cough decreased. Still bringing up thick brownish sputum with no blood. Still wheezing, about the same but not short of breath. She has IBS, has chronic mild nausea and diarrhea. History of GERD, on PPIs, symptoms controlled. Reports fatigue and feeling sleepy.   Past Medical History:  Diagnosis Date  . Allergy   . Anxiety   . Aortic regurgitation due to bicuspid aortic valve   . Arthritis   . Asthma    Patient denies  . Borderline diabetes 10/20/2016  . Chronic kidney disease    kidney stones  . Depression   . Family history of breast cancer in female   . Family history of colon cancer   . Fatty liver 11/28/2016  . GERD (gastroesophageal reflux disease)   . Heart murmur   . Hypertension   . IBS (irritable bowel syndrome)   . Insomnia   . Migraine headache   . OCD (obsessive compulsive disorder)   . Orbital cellulitis    at age 40 was hospitalized for 3 months (almost died)   . OSA (obstructive sleep apnea)   . S/P appy 05/2013  . Sleep apnea     Past Surgical History:  Procedure Laterality Date  . COLONOSCOPY    . DENTAL SURGERY     wisdom teeth  . ESOPHAGOGASTRODUODENOSCOPY    . LAPAROSCOPIC APPENDECTOMY N/A 06/06/2013   Procedure: APPENDECTOMY LAPAROSCOPIC;  Surgeon:  Imogene Burn. Georgette Dover, MD;  Location: [facility];  Service: General;  Laterality: N/A;    Social History   Social History  . Marital status: Single    Spouse name: N/A  . Number of children: 0  . Years of education: N/A   Occupational History  . Student   . Medical Sales    Social History Main Topics  . Smoking status: Former Smoker    Packs/day: 1.00    Years: 20.00    Types: Cigarettes    Quit date: 02/22/2013  . Smokeless tobacco: Never Used  . Alcohol use 0.0 oz/week     Comment: social  . Drug use: No  . Sexual activity: Not on file   Other Topics Concern  . Not on file   Social History Narrative   Working at 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   Friends and family in [place] as of 03/16/2017      Reactions   Codeine Nausea Only   Needs antiemetic   Adhesive [tape]    Tapes, bandaids   Amlodipine Other (See Comments)   EDEMA      Medication List  Accurate as of 03/16/17  7:20 PM. Always use your most recent med list.          ACCU-CHEK GUIDE w/Device Kit 1 each by Does not apply route daily.   ACCU-CHEK SOFTCLIX LANCETS lancets Use as instructed   budesonide-formoterol 160-4.5 MCG/ACT inhaler Commonly known as:  SYMBICORT Inhale 2 puffs into the lungs 2 (two) times daily.   clotrimazole-betamethasone cream Commonly known as:  LOTRISONE Apply 1 application topically 2 (two) times daily.   dicyclomine 20 MG tablet Commonly known as:  BENTYL TAKE 1 TABLET BY MOUTH 4 TIMES DAILY BEFORE MEALS AND AT BEDTIME   escitalopram 10 MG tablet Commonly known as:  LEXAPRO Take 1 tablet (10 mg total) by mouth daily.   fluticasone 50 MCG/ACT nasal spray Commonly known as:  FLONASE Place 2 sprays into both nostrils daily as needed for allergies.   furosemide 40 MG tablet Commonly known as:  LASIX Take 1 tablet (40 mg  total) by mouth every morning.   glucose blood test strip Commonly known as:  ACCU-CHEK GUIDE Use as instructed to check blood sugar once a day.   losartan 100 MG tablet Commonly known as:  COZAAR Take 1 tablet (100 mg total) by mouth daily.   meloxicam 15 MG tablet Commonly known as:  MOBIC TAKE ONE TABLET BY MOUTH DAILY FOR 2 WEEKS THEN AS NEEDED WITH FOOD   metoprolol succinate 50 MG 24 hr tablet Commonly known as:  TOPROL-XL Take 1 tablet (50 mg total) by mouth daily. Take with or immediately following a meal.   mirtazapine 15 MG tablet Commonly known as:  REMERON Take 1 tablet (15 mg total) by mouth at bedtime.   multivitamin with minerals tablet Take 1 tablet by mouth daily.   norgestimate-ethinyl estradiol 0.25-35 MG-MCG tablet Commonly known as:  ORTHO-CYCLEN,SPRINTEC,PREVIFEM Take 1 tablet by mouth every evening.   omeprazole 20 MG capsule Commonly known as:  PRILOSEC Take 45m before breakfast and 223mbefore supper.   ondansetron 4 MG tablet Commonly known as:  ZOFRAN Take 1 tablet (4 mg total) by mouth every 6 (six) hours.   Pancrelipase (Lip-Prot-Amyl) 24000-76000 units Cpep Commonly known as:  CREON Take 3 capsules (72,000 Units total) by mouth 3 (three) times daily before meals.   potassium chloride 10 MEQ tablet Commonly known as:  K-DUR,KLOR-CON Take 1 tablet (10 mEq total) by mouth every other day.   predniSONE 10 MG tablet Commonly known as:  DELTASONE 4 tablets x 2 days, 3 tabs x 2 days, 2 tabs x 2 days, 1 tab x 2 days   SUMAtriptan 50 MG tablet Commonly known as:  IMITREX Take 1 tablet (50 mg total) by mouth every 2 (two) hours as needed for migraine. May repeat in 2 hours if headache persists or recurs.   traZODone 50 MG tablet Commonly known as:  DESYREL Take 1-2 tablets (50-100 mg total) by mouth at bedtime.            Discharge Care Instructions        Start     Ordered   03/16/17 0000  predniSONE (DELTASONE) 10 MG tablet       03/16/17 1506   03/16/17 0000  budesonide-formoterol (SYMBICORT) 160-4.5 MCG/ACT inhaler  2 times daily     03/16/17 1506         Objective:   Physical Exam BP 128/80 (BP Location: Left Arm, Patient Position: Sitting, Cuff Size: Normal)   Pulse (!) 109   Temp 98 F (  36.7 C) (Oral)   Resp 14   Ht 5' 5.5" (1.664 m)   Wt 254 lb 8 oz (115.4 kg)   SpO2 96%   BMI 41.71 kg/m  General:   Well developed, well nourished . NAD.  HEENT:  Normocephalic . Face symmetric, atraumatic. TMs wnl. Nose slt congested. Throat symmetric, not red. Lungs:  Few rhonchi, + wheezing, a few, bilateral. No distress. Normal respiratory effort, no intercostal retractions, no accessory muscle use. Heart: RRR,  no murmur.  No pretibial edema bilaterally  Skin: Not pale. Not jaundice Neurologic:  alert & oriented X3.  Speech normal, gait appropriate for age and unassisted Psych--  Cognition and judgment appear intact.  Cooperative with normal attention span and concentration.  Behavior appropriate. No anxious or depressed appearing.      Assessment & Plan:   36 year old lady with multiple medical problems including HTN, migraines, sleep apnea, asthma, GERD,  IBS, increase LFTs, DJD, morbid obesity, on birth control pills presents w/:  Bronchitis, asthma exacerbation: Symptoms consistent with bronchitis, workup at the ER show a (-) chest x-ray  She actually was prescribed 20 tablets of doxycycline but according to the patient got 30 tablets  and is still taking it thus has been on abx ~ 12 days. She is better but not completely well, exam shows some wheezing, she is a slightly tachycardic but nontoxic. Plan: Stop antibiotics.  Start Symbicort, second round of prednisone, albuterol as a rescue inhaler. See AVS. See PCP in 2 weeks

## 2017-03-16 NOTE — Telephone Encounter (Signed)
PA initiated via Covermymeds;KEY: [id number]. Received real time PA approval.   UDODQV:[id number];IPPNDL:[id number];Review Type:Prior Auth;Coverage Start Date:02/14/2017;Coverage End Date:03/15/2020

## 2017-03-21 ENCOUNTER — Other Ambulatory Visit: Payer: Self-pay | Admitting: Family

## 2017-03-21 ENCOUNTER — Ambulatory Visit (HOSPITAL_BASED_OUTPATIENT_CLINIC_OR_DEPARTMENT_OTHER): Payer: Managed Care, Other (non HMO)

## 2017-03-21 VITALS — BP 128/75 | HR 70 | Temp 98.0°F | Resp 18

## 2017-03-21 DIAGNOSIS — D509 Iron deficiency anemia, unspecified: Secondary | ICD-10-CM | POA: Diagnosis not present

## 2017-03-21 DIAGNOSIS — D500 Iron deficiency anemia secondary to blood loss (chronic): Secondary | ICD-10-CM

## 2017-03-21 HISTORY — DX: Iron deficiency anemia secondary to blood loss (chronic): D50.0

## 2017-03-21 MED ORDER — SODIUM CHLORIDE 0.9 % IV SOLN
510.0000 mg | Freq: Once | INTRAVENOUS | Status: AC
  Administered 2017-03-21: 510 mg via INTRAVENOUS
  Filled 2017-03-21: qty 17

## 2017-03-21 MED ORDER — SODIUM CHLORIDE 0.9 % IV SOLN
Freq: Once | INTRAVENOUS | Status: AC
  Administered 2017-03-21: 11:00:00 via INTRAVENOUS

## 2017-03-21 NOTE — Patient Instructions (Signed)

## 2017-03-25 DIAGNOSIS — C229 Malignant neoplasm of liver, not specified as primary or secondary: Secondary | ICD-10-CM

## 2017-03-25 HISTORY — DX: Malignant neoplasm of liver, not specified as primary or secondary: C22.9

## 2017-03-29 ENCOUNTER — Encounter: Payer: Self-pay | Admitting: Family Medicine

## 2017-03-29 ENCOUNTER — Ambulatory Visit (INDEPENDENT_AMBULATORY_CARE_PROVIDER_SITE_OTHER): Payer: Managed Care, Other (non HMO) | Admitting: Family Medicine

## 2017-03-29 VITALS — BP 142/96 | HR 107 | Temp 98.2°F | Ht 66.0 in | Wt 260.5 lb

## 2017-03-29 DIAGNOSIS — J40 Bronchitis, not specified as acute or chronic: Secondary | ICD-10-CM

## 2017-03-29 DIAGNOSIS — R0600 Dyspnea, unspecified: Secondary | ICD-10-CM | POA: Diagnosis not present

## 2017-03-29 DIAGNOSIS — R05 Cough: Secondary | ICD-10-CM | POA: Diagnosis not present

## 2017-03-29 DIAGNOSIS — R059 Cough, unspecified: Secondary | ICD-10-CM

## 2017-03-29 MED ORDER — MONTELUKAST SODIUM 10 MG PO TABS: 10.0000 mg | ORAL_TABLET | Freq: Every day | ORAL | 0 refills | Status: DC

## 2017-03-29 NOTE — Progress Notes (Signed)
Pre visit review using our clinic review tool, if applicable. No additional management support is needed unless otherwise documented below in the visit note. 

## 2017-03-29 NOTE — Patient Instructions (Signed)
Continue to push fluids, practice good hand hygiene, and cover your mouth if you cough.  If you start having fevers, shaking or worsening shortness of breath, seek immediate care.  Let us know if anything changes.

## 2017-03-29 NOTE — Progress Notes (Signed)
Chief Complaint  Patient presents with  . Cough    Stephanie Miles is 36 y.o. and is here for a cough.  Duration: 1 mo Productive? Yes Associated symptoms: chills, rhinorrhea, some SOB Denies: fever, nasal congestion, chest pain, hemoptysis No official hx of COPD or asthma. She has been on Symbicort, pred, 7 days of doxy, and albuterol. All have been helpful. She also has a hx of allergies. Hx of GERD? Yes ACEi? No  Sick contacts? Yes  ROS:  Resp: +Cough Cardio: No chest pain  Past Medical History:  Diagnosis Date  . Allergy   . Anxiety   . Aortic regurgitation due to bicuspid aortic valve   . Arthritis   . Borderline diabetes 10/20/2016  . Chronic kidney disease    kidney stones  . Depression   . Family history of breast cancer in female   . Family history of colon cancer   . Fatty liver 11/28/2016  . GERD (gastroesophageal reflux disease)   . Heart murmur   . Hypertension   . IBS (irritable bowel syndrome)   . Insomnia   . Migraine headache   . OCD (obsessive compulsive disorder)   . Orbital cellulitis    at age 24 was hospitalized for 3 months (almost died)   . OSA (obstructive sleep apnea)   . S/P appy 05/2013  . Sleep apnea    Family History  Problem Relation Age of Onset  . Asthma Mother   . Hypertension Mother   . Breast cancer Father 72  . Diabetes Father   . Hypertension Father   . Heart disease Father   . Hyperlipidemia Father   . Asthma Sister   . Hyperlipidemia Brother   . Hypertension Brother   . Vision loss Brother   . Colon cancer Maternal Grandmother 46  . Liver cancer Maternal Grandmother   . Cervical cancer Maternal Aunt   . Stroke Paternal Uncle 11  . Melanoma Maternal Grandfather 24  . Colon polyps Maternal Grandfather   . AAA (abdominal aortic aneurysm) Paternal Grandmother   . Heart disease Paternal Grandfather   . Heart disease Paternal Uncle 67  . Colon cancer Other 60       MGM's mother  . Stomach cancer Other 45   MGM's mother  . Breast cancer Other        MGM's maternal aunt  . Cancer Other        MGM's maternal uncle with GI cancer  . Esophageal cancer Neg Hx   . Pancreatic cancer Neg Hx   . Rectal cancer Neg Hx    Allergies as of 03/29/2017      Reactions   Codeine Nausea Only   Needs antiemetic   Adhesive [tape]    Tapes, bandaids   Amlodipine Other (See Comments)   EDEMA      Medication List       Accurate as of 03/29/17  5:04 PM. Always use your most recent med list.          ACCU-CHEK GUIDE w/Device Kit 1 each by Does not apply route daily.   ACCU-CHEK SOFTCLIX LANCETS lancets Use as instructed   budesonide-formoterol 160-4.5 MCG/ACT inhaler Commonly known as:  SYMBICORT Inhale 2 puffs into the lungs 2 (two) times daily.   clotrimazole-betamethasone cream Commonly known as:  LOTRISONE Apply 1 application topically 2 (two) times daily.   dicyclomine 20 MG tablet Commonly known as:  BENTYL TAKE 1 TABLET BY MOUTH 4 TIMES DAILY BEFORE MEALS  AND AT BEDTIME   escitalopram 10 MG tablet Commonly known as:  LEXAPRO Take 1 tablet (10 mg total) by mouth daily.   fluticasone 50 MCG/ACT nasal spray Commonly known as:  FLONASE Place 2 sprays into both nostrils daily as needed for allergies.   furosemide 40 MG tablet Commonly known as:  LASIX Take 1 tablet (40 mg total) by mouth every morning.   glucose blood test strip Commonly known as:  ACCU-CHEK GUIDE Use as instructed to check blood sugar once a day.   losartan 100 MG tablet Commonly known as:  COZAAR Take 1 tablet (100 mg total) by mouth daily.   meloxicam 15 MG tablet Commonly known as:  MOBIC TAKE ONE TABLET BY MOUTH DAILY FOR 2 WEEKS THEN AS NEEDED WITH FOOD   metoprolol succinate 50 MG 24 hr tablet Commonly known as:  TOPROL-XL Take 1 tablet (50 mg total) by mouth daily. Take with or immediately following a meal.   mirtazapine 15 MG tablet Commonly known as:  REMERON Take 1 tablet (15 mg total) by mouth  at bedtime.   montelukast 10 MG tablet Commonly known as:  SINGULAIR Take 1 tablet (10 mg total) by mouth at bedtime.   multivitamin with minerals tablet Take 1 tablet by mouth daily.   norgestimate-ethinyl estradiol 0.25-35 MG-MCG tablet Commonly known as:  ORTHO-CYCLEN,SPRINTEC,PREVIFEM Take 1 tablet by mouth every evening.   omeprazole 20 MG capsule Commonly known as:  PRILOSEC Take 97m before breakfast and 26mbefore supper.   ondansetron 4 MG tablet Commonly known as:  ZOFRAN Take 1 tablet (4 mg total) by mouth every 6 (six) hours.   Pancrelipase (Lip-Prot-Amyl) 24000-76000 units Cpep Commonly known as:  CREON Take 3 capsules (72,000 Units total) by mouth 3 (three) times daily before meals.   potassium chloride 10 MEQ tablet Commonly known as:  K-DUR,KLOR-CON Take 1 tablet (10 mEq total) by mouth every other day.   SUMAtriptan 50 MG tablet Commonly known as:  IMITREX Take 1 tablet (50 mg total) by mouth every 2 (two) hours as needed for migraine. May repeat in 2 hours if headache persists or recurs.   traZODone 50 MG tablet Commonly known as:  DESYREL Take 1-2 tablets (50-100 mg total) by mouth at bedtime.            Discharge Care Instructions        Start     Ordered   03/29/17 0000  montelukast (SINGULAIR) 10 MG tablet  Daily at bedtime     03/29/17 1640      BP (!) 142/96 (BP Location: Left Arm, Patient Position: Sitting, Cuff Size: Large)   Pulse (!) 107   Temp 98.2 F (36.8 C) (Oral)   Ht 5' 6"  (1.676 m)   Wt 260 lb 8 oz (118.2 kg)   SpO2 98%   BMI 42.05 kg/m  Gen: Awake, alert, appears stated age HEENT: Ears neg, nares patent without D/C, turbinates unremarkable, Pharynx pink without exudate Neck: Supple, no masses or asymmetry, no tenderness Heart: RRR, no murmurs, +1 pitting LE edema Lungs: CTAB, normal effort, no accessory muscle use Psych: Age appropriate judgement and insight, normal mood and affect  Bronchitis  Dyspnea,  unspecified type - Plan: montelukast (SINGULAIR) 10 MG tablet  Cough - Plan: montelukast (SINGULAIR) 10 MG tablet  Question underlying obstructive airway disease. Try to add Singulair should it be related to asthma. She has known allergies. Will hold off on abx today as she has follow up in 1  week and has already been on Doxy, no fevers/abn breath sounds/rigors today. F/u as originally scheduled. The patient voiced understanding and agreement to the plan.  [place], DO 03/29/17 5:04 PM

## 2017-04-03 ENCOUNTER — Encounter: Payer: Self-pay | Admitting: Family

## 2017-04-03 ENCOUNTER — Ambulatory Visit (INDEPENDENT_AMBULATORY_CARE_PROVIDER_SITE_OTHER): Payer: Managed Care, Other (non HMO) | Admitting: Family

## 2017-04-03 VITALS — BP 130/78 | HR 87 | Temp 98.4°F | Resp 18 | Ht 66.0 in | Wt 259.4 lb

## 2017-04-03 DIAGNOSIS — J45901 Unspecified asthma with (acute) exacerbation: Secondary | ICD-10-CM

## 2017-04-03 DIAGNOSIS — R7303 Prediabetes: Secondary | ICD-10-CM | POA: Diagnosis not present

## 2017-04-03 DIAGNOSIS — M2550 Pain in unspecified joint: Secondary | ICD-10-CM | POA: Diagnosis not present

## 2017-04-03 DIAGNOSIS — K7581 Nonalcoholic steatohepatitis (NASH): Secondary | ICD-10-CM | POA: Diagnosis not present

## 2017-04-03 DIAGNOSIS — K589 Irritable bowel syndrome without diarrhea: Secondary | ICD-10-CM | POA: Diagnosis not present

## 2017-04-03 DIAGNOSIS — D500 Iron deficiency anemia secondary to blood loss (chronic): Secondary | ICD-10-CM | POA: Diagnosis not present

## 2017-04-03 DIAGNOSIS — R739 Hyperglycemia, unspecified: Secondary | ICD-10-CM

## 2017-04-03 DIAGNOSIS — Z23 Encounter for immunization: Secondary | ICD-10-CM

## 2017-04-03 MED ORDER — MELOXICAM 15 MG PO TABS: ORAL_TABLET | ORAL | 5 refills | Status: DC

## 2017-04-03 NOTE — Progress Notes (Signed)
Subjective:    Patient ID: Stephanie Miles, female    DOB: [birth year], 36 y.o.   MRN: [id number]  HPI  [name] is a 36 yr old female who presents today for follow up. She saw Dr. Nani Miles on 03/29/17. She was treated with singulair for ? Underlying obstructive airway disease. She was treated in the end of August with doxycycline.  Dr. Larose Miles started he began symbicort on 03/16/17.  She is also using flonase and zyrtec.  Cough is much improved. Pt reports that she noted huge improvement after starting the symbicort.   She had a liver biopsy 03/01/17 which was consistent with Non-Alcoholic Steatohepatitis.  GI has evaluated her for abdominal pain which is unchanged. Has chronic abdominal bloating and diarrhea which is unchanged.    Fatigue is a better- had an iron infusion.    Joint pain is unchanged.  Has an ultrasound scheduled with rheumatology.     Review of Systems See HPI  Past Medical History:  Diagnosis Date  . Allergy   . Anxiety   . Aortic regurgitation due to bicuspid aortic valve   . Arthritis   . Borderline diabetes 10/20/2016  . Chronic kidney disease    kidney stones  . Depression   . Family history of breast cancer in female   . Family history of colon cancer   . Fatty liver 11/28/2016  . GERD (gastroesophageal reflux disease)   . Heart murmur   . Hypertension   . IBS (irritable bowel syndrome)   . Insomnia   . Migraine headache   . OCD (obsessive compulsive disorder)   . Orbital cellulitis    at age 53 was hospitalized for 3 months (almost died)   . OSA (obstructive sleep apnea)   . S/P appy 05/2013  . Sleep apnea      Social History   Social History  . Marital status: Single    Spouse name: N/A  . Number of children: 0  . Years of education: N/A   Occupational History  . Student   . Medical Sales    Social History Main Topics  . Smoking status: Former Smoker    Packs/day: 1.00    Years: 20.00    Types: Cigarettes    Quit date: 02/22/2013  .  Smokeless tobacco: Never Used  . Alcohol use 0.0 oz/week     Comment: social  . Drug use: No  . Sexual activity: Not on file   Other Topics Concern  . Not on file   Social History Narrative   Working at [employer] doing [employer] alone (has a Neurosurgeon)   Working on a degree at [employer], will plan to return fall 2018   Enjoys spending time with family.   Friends and family in [place]    Past Surgical History:  Procedure Laterality Date  . COLONOSCOPY    . DENTAL SURGERY     wisdom teeth  . ESOPHAGOGASTRODUODENOSCOPY    . LAPAROSCOPIC APPENDECTOMY N/A 06/06/2013   Procedure: APPENDECTOMY LAPAROSCOPIC;  Surgeon: Stephanie Burn. Georgette Dover, MD;  Location: [facility];  Service: General;  Laterality: N/A;    Family History  Problem Relation Age of Onset  . Asthma Mother   . Hypertension Mother   . Breast cancer Father 23  . Diabetes Father   . Hypertension Father   . Heart disease Father   . Hyperlipidemia Father   . Asthma Sister   . Hyperlipidemia Brother   . Hypertension  Brother   . Vision loss Brother   . Colon cancer Maternal Grandmother 40  . Liver cancer Maternal Grandmother   . Cervical cancer Maternal Aunt   . Stroke Paternal Uncle 52  . Melanoma Maternal Grandfather 61  . Colon polyps Maternal Grandfather   . AAA (abdominal aortic aneurysm) Paternal Grandmother   . Heart disease Paternal Grandfather   . Heart disease Paternal Uncle 38  . Colon cancer Other 10       MGM's mother  . Stomach cancer Other 43       MGM's mother  . Breast cancer Other        MGM's maternal aunt  . Cancer Other        MGM's maternal uncle with GI cancer  . Esophageal cancer Neg Hx   . Pancreatic cancer Neg Hx   . Rectal cancer Neg Hx     Allergies  Allergen Reactions  . Codeine Nausea Only    Needs antiemetic  . Adhesive [Tape]     Tapes, bandaids  . Amlodipine Other (See Comments)    EDEMA    Current Outpatient Prescriptions on File Prior to Visit  Medication Sig  Dispense Refill  . ACCU-CHEK SOFTCLIX LANCETS lancets Use as instructed 100 each 1  . Blood Glucose Monitoring Suppl (ACCU-CHEK GUIDE) w/Device KIT 1 each by Does not apply route daily. 1 kit 0  . budesonide-formoterol (SYMBICORT) 160-4.5 MCG/ACT inhaler Inhale 2 puffs into the lungs 2 (two) times daily. 1 Inhaler 3  . clotrimazole-betamethasone (LOTRISONE) cream Apply 1 application topically 2 (two) times daily. (Patient taking differently: Apply 1 application topically 2 (two) times daily as needed. ) 30 g 1  . dicyclomine (BENTYL) 20 MG tablet TAKE 1 TABLET BY MOUTH 4 TIMES DAILY BEFORE MEALS AND AT BEDTIME 120 tablet 5  . escitalopram (LEXAPRO) 10 MG tablet Take 1 tablet (10 mg total) by mouth daily. 90 tablet 1  . fluticasone (FLONASE) 50 MCG/ACT nasal spray Place 2 sprays into both nostrils daily as needed for allergies.     . furosemide (LASIX) 40 MG tablet Take 1 tablet (40 mg total) by mouth every morning. 30 tablet 4  . glucose blood (ACCU-CHEK GUIDE) test strip Use as instructed to check blood sugar once a day. 100 each 1  . losartan (COZAAR) 100 MG tablet Take 1 tablet (100 mg total) by mouth daily. 30 tablet 4  . meloxicam (MOBIC) 15 MG tablet TAKE ONE TABLET BY MOUTH DAILY FOR 2 WEEKS THEN AS NEEDED WITH FOOD 30 tablet 0  . metoprolol succinate (TOPROL-XL) 50 MG 24 hr tablet Take 1 tablet (50 mg total) by mouth daily. Take with or immediately following a meal. 30 tablet 3  . mirtazapine (REMERON) 15 MG tablet Take 1 tablet (15 mg total) by mouth at bedtime. 30 tablet 5  . montelukast (SINGULAIR) 10 MG tablet Take 1 tablet (10 mg total) by mouth at bedtime. 30 tablet 0  . Multiple Vitamins-Minerals (MULTIVITAMIN WITH MINERALS) tablet Take 1 tablet by mouth daily.    . norgestimate-ethinyl estradiol (ORTHO-CYCLEN,SPRINTEC,PREVIFEM) 0.25-35 MG-MCG tablet Take 1 tablet by mouth every evening. 1 Package 11  . omeprazole (PRILOSEC) 20 MG capsule Take 12m before breakfast and 234mbefore  supper. 90 capsule 5  . ondansetron (ZOFRAN) 4 MG tablet Take 1 tablet (4 mg total) by mouth every 6 (six) hours. (Patient taking differently: Take 4 mg by mouth every 8 (eight) hours as needed for nausea or vomiting. ) 12 tablet 0  .  Pancrelipase, Lip-Prot-Amyl, (CREON) 24000-76000 units CPEP Take 3 capsules (72,000 Units total) by mouth 3 (three) times daily before meals. 270 capsule 2  . potassium chloride (K-DUR,KLOR-CON) 10 MEQ tablet Take 1 tablet (10 mEq total) by mouth every other day. 15 tablet 5  . SUMAtriptan (IMITREX) 50 MG tablet Take 1 tablet (50 mg total) by mouth every 2 (two) hours as needed for migraine. May repeat in 2 hours if headache persists or recurs. 10 tablet 0  . traZODone (DESYREL) 50 MG tablet Take 1-2 tablets (50-100 mg total) by mouth at bedtime. (Patient taking differently: Take 50-100 mg by mouth at bedtime as needed. ) 60 tablet 5   Current Facility-Administered Medications on File Prior to Visit  Medication Dose Route Frequency Provider Last Rate Last Dose  . 0.9 %  sodium chloride infusion  500 mL Intravenous Continuous Ladene Artist, MD        BP 130/78 (BP Location: Left Arm, Cuff Size: Large)   Pulse 87   Temp 98.4 F (36.9 C) (Oral)   Resp 18   Ht _0  (1.676 m)   Wt 259 lb 6.4 oz (117.7 kg)   SpO2 96%   BMI 41.87 kg/m       Objective:   Physical Exam  Constitutional: She is oriented to person, place, and time. She appears well-developed and well-nourished.  HENT:  Head: Normocephalic and atraumatic.  Cardiovascular: Normal rate, regular rhythm and normal heart sounds.   No murmur heard. Pulmonary/Chest: Effort normal and breath sounds normal. No respiratory distress. She has no wheezes.  Musculoskeletal: She exhibits no edema.  Neurological: She is alert and oriented to person, place, and time.  Psychiatric: She has a normal mood and affect. Her behavior is normal. Judgment and thought content normal.          Assessment & Plan:     Borderline DM- due for follow up A1C. She will return to the lab to have this drawn. Flu shot today.  Lab Results  Component Value Date   HGBA1C 6.3 10/17/2016   Joint pain- following with rheumatology and had upcoming [place] of her hands scheduled.

## 2017-04-04 ENCOUNTER — Encounter: Payer: Self-pay | Admitting: Family

## 2017-04-04 ENCOUNTER — Ambulatory Visit: Payer: Managed Care, Other (non HMO) | Admitting: Adult Health

## 2017-04-04 DIAGNOSIS — K7581 Nonalcoholic steatohepatitis (NASH): Secondary | ICD-10-CM

## 2017-04-04 HISTORY — DX: Nonalcoholic steatohepatitis (NASH): K75.81

## 2017-04-04 NOTE — Assessment & Plan Note (Addendum)
Fatigue is improved following IV iron infusion. Management per Hematology.

## 2017-04-04 NOTE — Assessment & Plan Note (Signed)
Has chronic bloating and diarrhea. Defer management to GI.

## 2017-04-04 NOTE — Assessment & Plan Note (Signed)
Improved on current regimen. Continue symbicort and albuterol.

## 2017-04-04 NOTE — Assessment & Plan Note (Signed)
New diagnosis- following with GI.

## 2017-04-10 NOTE — Progress Notes (Deleted)
Assessment / Plan:     Visit Diagnoses: ANA positive - ANA1:160NH, ENA -, C3&C4 normal, arthralgia, hair loss. Patient has no synovitis on examination today she has no clinical features of lupus. She continues to have a lot of arthralgias and hand pain. I will schedule ultrasound of her bilateral hands to look for synovitis.

## 2017-04-19 ENCOUNTER — Other Ambulatory Visit: Payer: Managed Care, Other (non HMO) | Admitting: Rheumatology

## 2017-04-20 ENCOUNTER — Other Ambulatory Visit (INDEPENDENT_AMBULATORY_CARE_PROVIDER_SITE_OTHER): Payer: Managed Care, Other (non HMO)

## 2017-04-20 DIAGNOSIS — R739 Hyperglycemia, unspecified: Secondary | ICD-10-CM

## 2017-04-21 LAB — HEMOGLOBIN A1C: Hgb A1c MFr Bld: 6.7 % — ABNORMAL HIGH (ref 4.6–6.5)

## 2017-04-23 ENCOUNTER — Encounter: Payer: Self-pay | Admitting: Family

## 2017-04-25 ENCOUNTER — Other Ambulatory Visit: Payer: Self-pay | Admitting: Family Medicine

## 2017-04-25 DIAGNOSIS — R059 Cough, unspecified: Secondary | ICD-10-CM

## 2017-04-25 DIAGNOSIS — R0600 Dyspnea, unspecified: Secondary | ICD-10-CM

## 2017-04-25 DIAGNOSIS — R05 Cough: Secondary | ICD-10-CM

## 2017-04-26 ENCOUNTER — Telehealth: Payer: Self-pay | Admitting: *Deleted

## 2017-04-26 NOTE — Telephone Encounter (Signed)
Received FMLA/STD paperwork from [employer]/SLS 10/03

## 2017-05-02 NOTE — Telephone Encounter (Signed)
Completed as much as possible on forms; forwarded to provider/SLS 10/08

## 2017-05-03 ENCOUNTER — Telehealth: Payer: Self-pay | Admitting: Family

## 2017-05-03 NOTE — Telephone Encounter (Signed)
Opened in error

## 2017-05-10 NOTE — Telephone Encounter (Signed)
I put in [name]'s folder on Monday.

## 2017-05-10 NOTE — Telephone Encounter (Addendum)
Stephanie Miles or Stephanie Miles-- do have these forms?

## 2017-05-11 NOTE — Telephone Encounter (Signed)
Forms with OV notes were faxed on Monday, but this is when the fax machine was not working properly; I have resent via fax this morning [[phone]] and will mail copy of [employer] forms to patient/SLS 10/18

## 2017-05-12 NOTE — Telephone Encounter (Signed)
Message sent to pt.

## 2017-06-09 ENCOUNTER — Ambulatory Visit (HOSPITAL_BASED_OUTPATIENT_CLINIC_OR_DEPARTMENT_OTHER): Payer: Managed Care, Other (non HMO) | Admitting: Family

## 2017-06-09 ENCOUNTER — Encounter: Payer: Self-pay | Admitting: Family

## 2017-06-09 ENCOUNTER — Ambulatory Visit (INDEPENDENT_AMBULATORY_CARE_PROVIDER_SITE_OTHER): Payer: Managed Care, Other (non HMO) | Admitting: Family

## 2017-06-09 ENCOUNTER — Other Ambulatory Visit: Payer: Self-pay

## 2017-06-09 ENCOUNTER — Other Ambulatory Visit (HOSPITAL_BASED_OUTPATIENT_CLINIC_OR_DEPARTMENT_OTHER): Payer: Managed Care, Other (non HMO)

## 2017-06-09 VITALS — BP 154/75 | HR 78 | Temp 99.1°F | Resp 18 | Ht 66.0 in | Wt 271.0 lb

## 2017-06-09 VITALS — BP 154/90 | HR 75 | Temp 98.2°F | Resp 18 | Ht 66.0 in | Wt 271.0 lb

## 2017-06-09 DIAGNOSIS — K589 Irritable bowel syndrome without diarrhea: Secondary | ICD-10-CM | POA: Diagnosis not present

## 2017-06-09 DIAGNOSIS — Z23 Encounter for immunization: Secondary | ICD-10-CM | POA: Diagnosis not present

## 2017-06-09 DIAGNOSIS — E538 Deficiency of other specified B group vitamins: Secondary | ICD-10-CM

## 2017-06-09 DIAGNOSIS — K7581 Nonalcoholic steatohepatitis (NASH): Secondary | ICD-10-CM

## 2017-06-09 DIAGNOSIS — D500 Iron deficiency anemia secondary to blood loss (chronic): Secondary | ICD-10-CM

## 2017-06-09 DIAGNOSIS — D72829 Elevated white blood cell count, unspecified: Secondary | ICD-10-CM

## 2017-06-09 DIAGNOSIS — E119 Type 2 diabetes mellitus without complications: Secondary | ICD-10-CM | POA: Diagnosis not present

## 2017-06-09 DIAGNOSIS — R252 Cramp and spasm: Secondary | ICD-10-CM

## 2017-06-09 DIAGNOSIS — D509 Iron deficiency anemia, unspecified: Secondary | ICD-10-CM

## 2017-06-09 DIAGNOSIS — J45909 Unspecified asthma, uncomplicated: Secondary | ICD-10-CM | POA: Diagnosis not present

## 2017-06-09 DIAGNOSIS — D72828 Other elevated white blood cell count: Secondary | ICD-10-CM

## 2017-06-09 DIAGNOSIS — D508 Other iron deficiency anemias: Secondary | ICD-10-CM

## 2017-06-09 LAB — CBC WITH DIFFERENTIAL (CANCER CENTER ONLY)
BASO#: 0.1 10*3/uL (ref 0.0–0.2)
BASO%: 0.4 % (ref 0.0–2.0)
EOS ABS: 0.5 10*3/uL (ref 0.0–0.5)
EOS%: 4.5 % (ref 0.0–7.0)
HEMATOCRIT: 37.3 % (ref 34.8–46.6)
HEMOGLOBIN: 12.6 g/dL (ref 11.6–15.9)
LYMPH#: 3.7 10*3/uL — AB (ref 0.9–3.3)
LYMPH%: 32.2 % (ref 14.0–48.0)
MCH: 29.6 pg (ref 26.0–34.0)
MCHC: 33.8 g/dL (ref 32.0–36.0)
MCV: 88 fL (ref 81–101)
MONO#: 0.7 10*3/uL (ref 0.1–0.9)
MONO%: 6.4 % (ref 0.0–13.0)
NEUT%: 56.5 % (ref 39.6–80.0)
NEUTROS ABS: 6.5 10*3/uL (ref 1.5–6.5)
Platelets: 254 10*3/uL (ref 145–400)
RBC: 4.25 10*6/uL (ref 3.70–5.32)
RDW: 14.4 % (ref 11.1–15.7)
WBC: 11.5 10*3/uL — ABNORMAL HIGH (ref 3.9–10.0)

## 2017-06-09 LAB — BASIC METABOLIC PANEL
BUN: 14 mg/dL (ref 7–25)
CALCIUM: 9.4 mg/dL (ref 8.6–10.2)
CHLORIDE: 104 mmol/L (ref 98–110)
CO2: 30 mmol/L (ref 20–32)
CREATININE: 0.87 mg/dL (ref 0.50–1.10)
Glucose, Bld: 92 mg/dL (ref 65–99)
POTASSIUM: 4.3 mmol/L (ref 3.5–5.3)
SODIUM: 141 mmol/L (ref 135–146)

## 2017-06-09 LAB — COMPREHENSIVE METABOLIC PANEL (CC13)
A/G RATIO: 1.2 (ref 1.2–2.2)
ALT: 143 IU/L — ABNORMAL HIGH (ref 0–32)
AST (SGOT): 139 IU/L — ABNORMAL HIGH (ref 0–40)
Albumin, Serum: 3.8 g/dL (ref 3.5–5.5)
Alkaline Phosphatase, S: 107 IU/L (ref 39–117)
BUN/Creatinine Ratio: 18 (ref 9–23)
BUN: 14 mg/dL (ref 6–20)
Bilirubin Total: 0.2 mg/dL (ref 0.0–1.2)
CALCIUM: 9.4 mg/dL (ref 8.7–10.2)
CO2: 24 mmol/L (ref 20–29)
CREATININE: 0.79 mg/dL (ref 0.57–1.00)
Chloride, Ser: 105 mmol/L (ref 96–106)
GFR, EST AFRICAN AMERICAN: 110 mL/min/{1.73_m2} (ref >59)
GFR, EST NON AFRICAN AMERICAN: 95 mL/min/{1.73_m2} (ref >59)
GLOBULIN, TOTAL: 3.2 g/dL (ref 1.5–4.5)
Glucose: 153 mg/dL — ABNORMAL HIGH (ref 65–99)
POTASSIUM: 4.2 mmol/L (ref 3.5–5.2)
SODIUM: 137 mmol/L (ref 134–144)
TOTAL PROTEIN: 7 g/dL (ref 6.0–8.5)

## 2017-06-09 LAB — MAGNESIUM: MAGNESIUM: 1.9 mg/dL (ref 1.5–2.5)

## 2017-06-09 MED ORDER — METOPROLOL SUCCINATE ER 100 MG PO TB24: 100.0000 mg | ORAL_TABLET | Freq: Every day | ORAL | 3 refills | Status: DC

## 2017-06-09 NOTE — Progress Notes (Signed)
Subjective:    Patient ID: Stephanie Miles, female    DOB: 1980-10-01, 36 y.o.   MRN: [id number]  HPI   DM2- diet controlled.  Lab Results  Component Value Date   HGBA1C 6.7 (H) 04/20/2017   HGBA1C 6.3 10/17/2016   HGBA1C 5.8 02/25/2016   Lab Results  Component Value Date   CREATININE 0.92 03/10/2017    IBS/NASH- Following with GI    Asthma- maintained on singulair/ symbicort and albuterol.   Leg cramps at night.  Fatigue improved after IV iron infusion.   Stopped soda and gatorade. Cut carbs, increased fiber. Reports that she has been walking 1 mile to the bus stop every day.  3-4 days a week she walks an additional mild.   Wt Readings from Last 3 Encounters:  06/09/17 271 lb (122.9 kg)  06/09/17 271 lb (122.9 kg)  04/03/17 259 lb 6.4 oz (117.7 kg)      Review of Systems See HPI  Past Medical History:  Diagnosis Date  . Allergy   . Anxiety   . Aortic regurgitation due to bicuspid aortic valve   . Arthritis   . Borderline diabetes 10/20/2016  . Chronic kidney disease    kidney stones  . Depression   . Diabetes type 2, controlled ([place]) 10/20/2016  . Family history of breast cancer in female   . Family history of colon cancer   . Fatty liver 11/28/2016  . GERD (gastroesophageal reflux disease)   . Heart murmur   . Hypertension   . IBS (irritable bowel syndrome)   . Insomnia   . Migraine headache   . NASH (nonalcoholic steatohepatitis) 04/04/2017   Per biopsy 9/18  . OCD (obsessive compulsive disorder)   . Orbital cellulitis    at age 58 was hospitalized for 3 months (almost died)   . OSA (obstructive sleep apnea)   . S/P appy 05/2013  . Sleep apnea      Social History   Socioeconomic History  . Marital status: Single    Spouse name: Not on file  . Number of children: 0  . Years of education: Not on file  . Highest education level: Not on file  Social Needs  . Financial resource strain: Not on file  . Food insecurity - worry: Not on file  .  Food insecurity - inability: Not on file  . Transportation needs - medical: Not on file  . Transportation needs - non-medical: Not on file  Occupational History  . Occupation: Ship broker  . Occupation: Programmer, applications  Tobacco Use  . Smoking status: Former Smoker    Packs/day: 1.00    Years: 20.00    Pack years: 20.00    Types: Cigarettes    Last attempt to quit: 02/22/2013    Years since quitting: 4.2  . Smokeless tobacco: Never Used  Substance and Sexual Activity  . Alcohol use: Yes    Alcohol/week: 0.0 oz    Comment: social  . Drug use: No  . Sexual activity: Not on file  Other Topics Concern  . Not on file  Social History Narrative   Working at [employer] doing [employer] alone (has a Neurosurgeon)   Working on a degree at [employer], will plan to return fall 2018   Enjoys spending time with family.   Friends and family in [place]    Past Surgical History:  Procedure Laterality Date  . APPENDECTOMY LAPAROSCOPIC N/A 06/06/2013   Performed by Donnie Mesa,  MD at [facility]    . DENTAL SURGERY     wisdom teeth  . ESOPHAGOGASTRODUODENOSCOPY      Family History  Problem Relation Age of Onset  . Asthma Mother   . Hypertension Mother   . Breast cancer Father 51  . Diabetes Father   . Hypertension Father   . Heart disease Father   . Hyperlipidemia Father   . Asthma Sister   . Hyperlipidemia Brother   . Hypertension Brother   . Vision loss Brother   . Colon cancer Maternal Grandmother 82  . Liver cancer Maternal Grandmother   . Cervical cancer Maternal Aunt   . Stroke Paternal Uncle 36  . Melanoma Maternal Grandfather 56  . Colon polyps Maternal Grandfather   . AAA (abdominal aortic aneurysm) Paternal Grandmother   . Heart disease Paternal Grandfather   . Heart disease Paternal Uncle 85  . Colon cancer Other 50       MGM's mother  . Stomach cancer Other 43       MGM's mother  . Breast cancer Other        MGM's maternal aunt  . Cancer Other         MGM's maternal uncle with GI cancer  . Esophageal cancer Neg Hx   . Pancreatic cancer Neg Hx   . Rectal cancer Neg Hx     Allergies  Allergen Reactions  . Codeine Nausea Only    Needs antiemetic  . Adhesive [Tape]     Tapes, bandaids  . Amlodipine Other (See Comments)    EDEMA    Current Outpatient Medications on File Prior to Visit  Medication Sig Dispense Refill  . ACCU-CHEK SOFTCLIX LANCETS lancets Use as instructed 100 each 1  . Blood Glucose Monitoring Suppl (ACCU-CHEK GUIDE) w/Device KIT 1 each by Does not apply route daily. 1 kit 0  . budesonide-formoterol (SYMBICORT) 160-4.5 MCG/ACT inhaler Inhale 2 puffs into the lungs 2 (two) times daily. 1 Inhaler 3  . cetirizine (ZYRTEC) 10 MG tablet Take 10 mg by mouth daily.    . clotrimazole-betamethasone (LOTRISONE) cream Apply 1 application topically 2 (two) times daily. (Patient taking differently: Apply 1 application topically 2 (two) times daily as needed. ) 30 g 1  . dicyclomine (BENTYL) 20 MG tablet TAKE 1 TABLET BY MOUTH 4 TIMES DAILY BEFORE MEALS AND AT BEDTIME 120 tablet 5  . escitalopram (LEXAPRO) 10 MG tablet Take 1 tablet (10 mg total) by mouth daily. 90 tablet 1  . fluticasone (FLONASE) 50 MCG/ACT nasal spray Place 2 sprays into both nostrils daily as needed for allergies.     . furosemide (LASIX) 40 MG tablet Take 1 tablet (40 mg total) by mouth every morning. 30 tablet 4  . glucose blood (ACCU-CHEK GUIDE) test strip Use as instructed to check blood sugar once a day. 100 each 1  . losartan (COZAAR) 100 MG tablet Take 1 tablet (100 mg total) by mouth daily. 30 tablet 4  . meloxicam (MOBIC) 15 MG tablet TAKE ONE TABLET BY MOUTH DAILY FOR 2 WEEKS THEN AS NEEDED WITH FOOD 30 tablet 5  . metoprolol succinate (TOPROL-XL) 50 MG 24 hr tablet Take 1 tablet (50 mg total) by mouth daily. Take with or immediately following a meal. 30 tablet 3  . mirtazapine (REMERON) 15 MG tablet Take 1 tablet (15 mg total) by mouth at bedtime. 30  tablet 5  . montelukast (SINGULAIR) 10 MG tablet TAKE ONE TABLET BY MOUTH AT  BEDTIME 30 tablet 5  . Multiple Vitamins-Minerals (MULTIVITAMIN WITH MINERALS) tablet Take 1 tablet by mouth daily.    . norgestimate-ethinyl estradiol (ORTHO-CYCLEN,SPRINTEC,PREVIFEM) 0.25-35 MG-MCG tablet Take 1 tablet by mouth every evening. 1 Package 11  . omeprazole (PRILOSEC) 20 MG capsule Take 58m before breakfast and 236mbefore supper. 90 capsule 5  . ondansetron (ZOFRAN) 4 MG tablet Take 1 tablet (4 mg total) by mouth every 6 (six) hours. (Patient taking differently: Take 4 mg by mouth every 8 (eight) hours as needed for nausea or vomiting. ) 12 tablet 0  . Pancrelipase, Lip-Prot-Amyl, (CREON) 24000-76000 units CPEP Take 3 capsules (72,000 Units total) by mouth 3 (three) times daily before meals. 270 capsule 2  . potassium chloride (K-DUR,KLOR-CON) 10 MEQ tablet Take 1 tablet (10 mEq total) by mouth every other day. 15 tablet 5  . SUMAtriptan (IMITREX) 50 MG tablet Take 1 tablet (50 mg total) by mouth every 2 (two) hours as needed for migraine. May repeat in 2 hours if headache persists or recurs. 10 tablet 0  . traZODone (DESYREL) 50 MG tablet Take 1-2 tablets (50-100 mg total) by mouth at bedtime. (Patient taking differently: Take 50-100 mg by mouth at bedtime as needed. ) 60 tablet 5   Current Facility-Administered Medications on File Prior to Visit  Medication Dose Route Frequency Provider Last Rate Last Dose  . 0.9 %  sodium chloride infusion  500 mL Intravenous Continuous StLadene ArtistMD        BP (!) 154/75 (BP Location: Right Arm, Patient Position: Sitting, Cuff Size: Large)   Pulse 78   Temp 99.1 F (37.3 C) (Oral)   Resp 18   Ht 5' 6"  (1.676 m)   Wt 271 lb (122.9 kg)   SpO2 94%   BMI 43.74 kg/m       Objective:   Physical Exam  Constitutional: She is oriented to person, place, and time. She appears well-developed and well-nourished.  HENT:  Head: Normocephalic and atraumatic.    Cardiovascular: Normal rate, regular rhythm and normal heart sounds.  No murmur heard. Pulmonary/Chest: Effort normal and breath sounds normal. No respiratory distress. She has no wheezes.  Neurological: She is alert and oriented to person, place, and time.  Psychiatric: She has a normal mood and affect. Her behavior is normal. Judgment and thought content normal.          Assessment & Plan:  Leg cramping- check bmet and mag.

## 2017-06-09 NOTE — Progress Notes (Signed)
Hematology and Oncology Follow Up Visit  Stephanie Miles [id number] 03-02-1981 36 y.o. 06/09/2017   Principle Diagnosis:  Leukocytosis - reactive Arthritis Iron deficiency anemia   Current Therapy:   Observation IV iron as indicated    Interim History:  Stephanie Miles is here today for follow-up. She is doing fairly well but states that she was recently diagnosed with diabetes. She is not currently on any medication for this and has an appointment later today with Stephanie Miles to discuss further.   Her WBC count today is stable at 11.5.  She received IV iron in August and responded nicely. Her energy improved and she felt much better. She is now starting feel a little fatigued again.  She has had no fever, chills, n/v, cough, rash, dizziness, SOB, chest pain, palpitations or changes in bowel or bladder habits.  She had one episode of dark red blood in her stool a week or 2 ago. She has IBS and history of colon polyps and diverticulitis. She has abdominal pain due to IBS.  She skips her placebo week with her birth control and does not have a cycle.  No other bleeding, no bruising or petechiae. No lymphadenopathy.  No swelling, tenderness, numbness or tingling in her extremities. No c/o pain. Her appetite comes and goes. She is hydrating well. She is concerned about her weight gain. Her weight is up another 12 lbs since we saw her in September.   ECOG Performance Status: 1 - Symptomatic but completely ambulatory  Medications:  Allergies as of 06/09/2017      Reactions   Codeine Nausea Only   Needs antiemetic   Adhesive [tape]    Tapes, bandaids   Amlodipine Other (See Comments)   EDEMA      Medication List        Accurate as of 06/09/17  1:15 PM. Always use your most recent med list.          ACCU-CHEK GUIDE w/Device Kit 1 each by Does not apply route daily.   ACCU-CHEK SOFTCLIX LANCETS lancets Use as instructed   budesonide-formoterol 160-4.5 MCG/ACT  inhaler Commonly known as:  SYMBICORT Inhale 2 puffs into the lungs 2 (two) times daily.   cetirizine 10 MG tablet Commonly known as:  ZYRTEC Take 10 mg by mouth daily.   clotrimazole-betamethasone cream Commonly known as:  LOTRISONE Apply 1 application topically 2 (two) times daily.   dicyclomine 20 MG tablet Commonly known as:  BENTYL TAKE 1 TABLET BY MOUTH 4 TIMES DAILY BEFORE MEALS AND AT BEDTIME   escitalopram 10 MG tablet Commonly known as:  LEXAPRO Take 1 tablet (10 mg total) by mouth daily.   fluticasone 50 MCG/ACT nasal spray Commonly known as:  FLONASE Place 2 sprays into both nostrils daily as needed for allergies.   furosemide 40 MG tablet Commonly known as:  LASIX Take 1 tablet (40 mg total) by mouth every morning.   glucose blood test strip Commonly known as:  ACCU-CHEK GUIDE Use as instructed to check blood sugar once a day.   losartan 100 MG tablet Commonly known as:  COZAAR Take 1 tablet (100 mg total) by mouth daily.   meloxicam 15 MG tablet Commonly known as:  MOBIC TAKE ONE TABLET BY MOUTH DAILY FOR 2 WEEKS THEN AS NEEDED WITH FOOD   metoprolol succinate 50 MG 24 hr tablet Commonly known as:  TOPROL-XL Take 1 tablet (50 mg total) by mouth daily. Take with or immediately following a meal.   mirtazapine  15 MG tablet Commonly known as:  REMERON Take 1 tablet (15 mg total) by mouth at bedtime.   montelukast 10 MG tablet Commonly known as:  SINGULAIR TAKE ONE TABLET BY MOUTH AT BEDTIME   multivitamin with minerals tablet Take 1 tablet by mouth daily.   norgestimate-ethinyl estradiol 0.25-35 MG-MCG tablet Commonly known as:  ORTHO-CYCLEN,SPRINTEC,PREVIFEM Take 1 tablet by mouth every evening.   omeprazole 20 MG capsule Commonly known as:  PRILOSEC Take 24m before breakfast and 277mbefore supper.   ondansetron 4 MG tablet Commonly known as:  ZOFRAN Take 1 tablet (4 mg total) by mouth every 6 (six) hours.   Pancrelipase (Lip-Prot-Amyl)  24000-76000 units Cpep Commonly known as:  CREON Take 3 capsules (72,000 Units total) by mouth 3 (three) times daily before meals.   potassium chloride 10 MEQ tablet Commonly known as:  K-DUR,KLOR-CON Take 1 tablet (10 mEq total) by mouth every other day.   SUMAtriptan 50 MG tablet Commonly known as:  IMITREX Take 1 tablet (50 mg total) by mouth every 2 (two) hours as needed for migraine. May repeat in 2 hours if headache persists or recurs.   traZODone 50 MG tablet Commonly known as:  DESYREL Take 1-2 tablets (50-100 mg total) by mouth at bedtime.       Allergies:  Allergies  Allergen Reactions  . Codeine Nausea Only    Needs antiemetic  . Adhesive [Tape]     Tapes, bandaids  . Amlodipine Other (See Comments)    EDEMA    Past Medical History, Surgical history, Social history, and Family History were reviewed and updated.  Review of Systems: All other 10 point review of systems is negative.   Physical Exam:  vitals were not taken for this visit.   Wt Readings from Last 3 Encounters:  04/03/17 259 lb 6.4 oz (117.7 kg)  03/29/17 260 lb 8 oz (118.2 kg)  03/16/17 254 lb 8 oz (115.4 kg)    Ocular: Sclerae unicteric, pupils equal, round and reactive to light Ear-nose-throat: Oropharynx clear, dentition fair Lymphatic: No cervical, supraclavicular or axillary adenopathy Lungs no rales or rhonchi, good excursion bilaterally Heart regular rate and rhythm, no murmur appreciated Abd soft, nontender, positive bowel sounds, no liver or spleen tip palpated on exam, no fluid wave MSK no focal spinal tenderness, no joint edema Neuro: non-focal, well-oriented, appropriate affect Breasts: Deferred   Lab Results  Component Value Date   WBC 11.5 (H) 06/09/2017   HGB 12.6 06/09/2017   HCT 37.3 06/09/2017   MCV 88 06/09/2017   PLT 254 06/09/2017   Lab Results  Component Value Date   FERRITIN 202 03/10/2017   IRON 58 03/10/2017   TIBC 326 03/10/2017   UIBC 268 03/10/2017     IRONPCTSAT 18 (L) 03/10/2017   Lab Results  Component Value Date   RBC 4.25 06/09/2017   No results found for: [facility] Results  Component Value Date   IGA 195 01/11/2017   Lab Results  Component Value Date   TOTALPROTELP 6.9 12/28/2016   ALBUMINELP 3.6 (L) 12/28/2016   A1GS 0.4 (H) 12/28/2016   A2GS 0.9 12/28/2016   BETS 0.6 12/28/2016   BETA2SER 0.4 12/28/2016   GAMS 1.0 12/28/2016   SPEI SEE NOTE 12/28/2016     Chemistry      Component Value Date/Time   NA 139 03/10/2017 1326   K 3.9 03/10/2017 1326   CL 104 03/10/2017 1326   CO2 24 03/10/2017 1326   BUN  15 03/10/2017 1326   CREATININE 0.92 03/10/2017 1326   CREATININE 0.86 12/28/2016 0941      Component Value Date/Time   CALCIUM 8.9 03/10/2017 1326   ALKPHOS 120 (H) 03/10/2017 1326   AST 158 (H) 03/10/2017 1326   ALT 209 (H) 03/10/2017 1326   BILITOT 0.2 03/10/2017 1326      Impression and Plan: Ms. Landau is a very pleasant 36 yo caucasian female with reactive leukocytosis with arthritis and iron deficiency anemia. She has some fatigue off an on.  She received IV iron in August and had a nice response. We will see what her iron studies show and bring her back in next week for an infusion if needed.  WBC count is stable at 11.5. Hgb stable at 12.6, MCV 88 and platelet count 254.  We will go ahead and plan to see her back again in another 4 months for follow-up and lab.  She will contact our office with any questions or concerns. We can certainly see her sooner if need be.   Eliezer Bottom, NP 11/16/20181:15 PM

## 2017-06-09 NOTE — Patient Instructions (Addendum)
Please increase metoprolol from 47m to 1066m   Send me your blood pressure and heart rate via mychart after you increase your metoprolol.

## 2017-06-10 LAB — VITAMIN B12: VITAMIN B 12: 636 pg/mL (ref 232–1245)

## 2017-06-11 NOTE — Assessment & Plan Note (Signed)
Advised pt to follow up with GI.

## 2017-06-11 NOTE — Assessment & Plan Note (Signed)
Continue to work on weight loss.

## 2017-06-11 NOTE — Assessment & Plan Note (Signed)
Stable, continue diabetic diet.

## 2017-06-11 NOTE — Assessment & Plan Note (Signed)
Stable on current meds 

## 2017-06-12 ENCOUNTER — Encounter (HOSPITAL_BASED_OUTPATIENT_CLINIC_OR_DEPARTMENT_OTHER): Payer: Self-pay | Admitting: Emergency Medicine

## 2017-06-12 ENCOUNTER — Other Ambulatory Visit: Payer: Self-pay

## 2017-06-12 ENCOUNTER — Emergency Department (HOSPITAL_BASED_OUTPATIENT_CLINIC_OR_DEPARTMENT_OTHER): Payer: Managed Care, Other (non HMO)

## 2017-06-12 ENCOUNTER — Emergency Department (HOSPITAL_BASED_OUTPATIENT_CLINIC_OR_DEPARTMENT_OTHER)
Admission: EM | Admit: 2017-06-12 | Discharge: 2017-06-12 | Disposition: A | Discharge status: Home / Self Care | Payer: Managed Care, Other (non HMO) | Attending: Emergency Medicine | Admitting: Emergency Medicine

## 2017-06-12 DIAGNOSIS — E119 Type 2 diabetes mellitus without complications: Secondary | ICD-10-CM | POA: Diagnosis not present

## 2017-06-12 DIAGNOSIS — R1011 Right upper quadrant pain: Secondary | ICD-10-CM | POA: Diagnosis not present

## 2017-06-12 DIAGNOSIS — Z79899 Other long term (current) drug therapy: Secondary | ICD-10-CM | POA: Diagnosis not present

## 2017-06-12 DIAGNOSIS — I10 Essential (primary) hypertension: Secondary | ICD-10-CM | POA: Diagnosis not present

## 2017-06-12 DIAGNOSIS — F17210 Nicotine dependence, cigarettes, uncomplicated: Secondary | ICD-10-CM | POA: Diagnosis not present

## 2017-06-12 DIAGNOSIS — Z794 Long term (current) use of insulin: Secondary | ICD-10-CM | POA: Diagnosis not present

## 2017-06-12 LAB — URINALYSIS, MICROSCOPIC (REFLEX): RBC / HPF: NONE SEEN RBC/hpf (ref 0–5)

## 2017-06-12 LAB — COMPREHENSIVE METABOLIC PANEL
ALT: 217 U/L — AB (ref 14–54)
ANION GAP: 8 (ref 5–15)
AST: 319 U/L — ABNORMAL HIGH (ref 15–41)
Albumin: 4 g/dL (ref 3.5–5.0)
Alkaline Phosphatase: 109 U/L (ref 38–126)
BUN: 10 mg/dL (ref 6–20)
CHLORIDE: 100 mmol/L — AB (ref 101–111)
CO2: 27 mmol/L (ref 22–32)
CREATININE: 0.77 mg/dL (ref 0.44–1.00)
Calcium: 9.2 mg/dL (ref 8.9–10.3)
GFR calc non Af Amer: >60 mL/min (ref >60)
Glucose, Bld: 115 mg/dL — ABNORMAL HIGH (ref 65–99)
POTASSIUM: 3.4 mmol/L — AB (ref 3.5–5.1)
SODIUM: 135 mmol/L (ref 135–145)
Total Bilirubin: 0.4 mg/dL (ref 0.3–1.2)
Total Protein: 8 g/dL (ref 6.5–8.1)

## 2017-06-12 LAB — IRON AND TIBC
%SAT: 18 % — ABNORMAL LOW (ref 21–57)
Iron: 62 ug/dL (ref 41–142)
TIBC: 333 ug/dL (ref 236–444)
UIBC: 271 ug/dL (ref 120–384)

## 2017-06-12 LAB — CBC
HEMATOCRIT: 39.7 % (ref 36.0–46.0)
HEMOGLOBIN: 13.3 g/dL (ref 12.0–15.0)
MCH: 29 pg (ref 26.0–34.0)
MCHC: 33.5 g/dL (ref 30.0–36.0)
MCV: 86.7 fL (ref 78.0–100.0)
Platelets: 272 10*3/uL (ref 150–400)
RBC: 4.58 MIL/uL (ref 3.87–5.11)
RDW: 14.3 % (ref 11.5–15.5)
WBC: 11.5 10*3/uL — AB (ref 4.0–10.5)

## 2017-06-12 LAB — URINALYSIS, ROUTINE W REFLEX MICROSCOPIC
Bilirubin Urine: NEGATIVE
GLUCOSE, UA: NEGATIVE mg/dL
HGB URINE DIPSTICK: NEGATIVE
Ketones, ur: NEGATIVE mg/dL
Nitrite: NEGATIVE
PH: 7 (ref 5.0–8.0)
Protein, ur: NEGATIVE mg/dL
SPECIFIC GRAVITY, URINE: 1.01 (ref 1.005–1.030)

## 2017-06-12 LAB — LIPASE, BLOOD: LIPASE: 28 U/L (ref 11–51)

## 2017-06-12 LAB — FERRITIN: FERRITIN: 384 ng/mL — AB (ref 9–269)

## 2017-06-12 MED ORDER — OXYCODONE HCL 5 MG PO TABS
5.0000 mg | ORAL_TABLET | Freq: Four times a day (QID) | ORAL | 0 refills | Status: DC | PRN
Start: 2017-06-12 — End: 2017-09-05

## 2017-06-12 MED ORDER — HYDROMORPHONE HCL 1 MG/ML IJ SOLN
1.0000 mg | Freq: Once | INTRAMUSCULAR | Status: AC
  Administered 2017-06-12: 1 mg via INTRAVENOUS
  Filled 2017-06-12: qty 1

## 2017-06-12 MED ORDER — ONDANSETRON 4 MG PO TBDP: 4.0000 mg | ORAL_TABLET | Freq: Three times a day (TID) | ORAL | 0 refills | Status: DC | PRN

## 2017-06-12 MED ORDER — ONDANSETRON HCL 4 MG/2ML IJ SOLN
4.0000 mg | Freq: Once | INTRAMUSCULAR | Status: AC
Start: 2017-06-12 — End: 2017-06-12
  Administered 2017-06-12: 4 mg via INTRAVENOUS
  Filled 2017-06-12: qty 2

## 2017-06-12 NOTE — Discharge Instructions (Signed)
Take 1 tablet of Roxicodone every 6 hours as needed for severe pain.  Continue to take your meloxicam daily.  Avoid medications with Tylenol since your liver enzymes were elevated today.  Do not take naproxen, Aleve, ibuprofen, or Motrin while you are taking meloxicam.  Make sure to take the meloxicam with food to avoid upset stomach.  Take 1 tablet of Zofran, which dissolves under the tongue, every 8 hours as needed for nausea and/or vomiting.  I sent Dr. Fuller Plan a message through our medical record system to let them know that you would be following up with him.  If you have new or worsening symptoms, including lime green or blood in your vomit, uncontrollable pain, fever, chills, or other concerning symptoms, please return to the emergency department for reevaluation.

## 2017-06-12 NOTE — ED Notes (Signed)
Pt teaching provided on medications that may cause drowsiness. Pt instructed not to drive or operate heavy machinery while taking the prescribed medication. Pt verbalized understanding.

## 2017-06-12 NOTE — ED Notes (Signed)
Pt returned from US

## 2017-06-12 NOTE — ED Triage Notes (Signed)
Sudden onset of RUQ pain that started this afternoon. 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 Pt was hyperventilating in the triage room and was c/o tingling in bilateral hands. Pt was able to control breathing with coaching.

## 2017-06-12 NOTE — ED Provider Notes (Signed)
[facility] Provider Note   CSN: [id number] Arrival date & time: 06/12/17  1715     History   Chief Complaint Chief Complaint  Patient presents with  . Abdominal Pain    HPI Stephanie Miles is a 36 y.o. female with a history of Barrett's esophagus, Nash, diabetes mellitus, hypertension, nephrolithiasis, biliary dyskinesia, and IBS who presents to the emergency department with constant, achy, sharp right upper quadrant pain that radiates to the right back.  She denies nausea, vomiting, diarrhea, fever, chills, dysuria, frequency, vaginal pain or discharge.  She is followed by Dr. Fuller Plan with [facility].  She states that she does not feel like she can get a deep breath due to the pain.  No treatment prior to arrival.  He denies chest pain, dyspnea, palpitations, or lower extremity swelling.  The history is provided by the patient. No language interpreter was used.  Abdominal Pain   This is a new problem. The current episode started 6 to 12 hours ago. The problem occurs constantly. The problem has been rapidly worsening. The pain is located in the RUQ. The quality of the pain is sharp and aching. The pain is at a severity of 10/10. The pain is severe. Pertinent negatives include anorexia, fever, belching, diarrhea, flatus, hematochezia, melena, nausea, vomiting, constipation, dysuria, frequency, hematuria, headaches, arthralgias and myalgias.    Past Medical History:  Diagnosis Date  . Allergy   . Anxiety   . Aortic regurgitation due to bicuspid aortic valve   . Arthritis   . Borderline diabetes 10/20/2016  . Chronic kidney disease    kidney stones  . Depression   . Diabetes type 2, controlled ([place]) 10/20/2016  . Family history of breast cancer in female   . Family history of colon cancer   . Fatty liver 11/28/2016  . GERD (gastroesophageal reflux disease)   . Heart murmur   . Hypertension   . IBS (irritable bowel syndrome)   .  Insomnia   . Migraine headache   . NASH (nonalcoholic steatohepatitis) 04/04/2017   Per biopsy 9/18  . OCD (obsessive compulsive disorder)   . Orbital cellulitis    at age 40 was hospitalized for 3 months (almost died)   . OSA (obstructive sleep apnea)   . S/P appy 05/2013  . Sleep apnea     Patient Active Problem List   Diagnosis Date Noted  . NASH (nonalcoholic steatohepatitis) 04/04/2017  . Iron deficiency anemia due to chronic blood loss 03/21/2017  . Genetic testing 02/28/2017  . Family history of breast cancer in female   . Family history of colon cancer   . Elevated LFTs 01/11/2017  . RUQ abdominal pain 01/11/2017  . PTSD (post-traumatic stress disorder) 12/28/2016  . Primary osteoarthritis of both knees 12/28/2016  . ANA positive 12/28/2016  . Leukocytosis 12/06/2016  . Fatty liver 11/28/2016  . History of kidney stones 10/21/2016  . Osteoarthritis 10/21/2016  . Diabetes type 2, controlled ([place]) 10/20/2016  . Cough variant asthma vs uacs  07/07/2015  . Severe obesity (BMI >= 40) ([place]) 07/07/2015  . Dyspnea 07/07/2015  . Asthma 05/27/2015  . Aortic regurgitation due to bicuspid aortic valve 03/12/2014  . Personal history of tobacco use, presenting hazards to health 01/20/2014  . Anxiety state 10/16/2013  . Migraine 07/08/2013  . Sleep apnea 07/08/2013  . IBS (irritable bowel syndrome) 06/06/2013  . GERD (gastroesophageal reflux disease) 06/06/2013  . HTN (hypertension) 06/06/2013    Past Surgical History:  Procedure Laterality Date  . APPENDECTOMY    . APPENDECTOMY LAPAROSCOPIC N/A 06/06/2013   Performed by Donnie Mesa, MD at [facility]    . DENTAL SURGERY     wisdom teeth  . ESOPHAGOGASTRODUODENOSCOPY    . LIVER BIOPSY      OB History    No data available       Home Medications    Prior to Admission medications   Medication Sig Start Date End Date Taking? Authorizing Provider  ACCU-CHEK SOFTCLIX LANCETS lancets Use as instructed  01/09/17   Debbrah Alar, NP  Blood Glucose Monitoring Suppl (ACCU-CHEK GUIDE) w/Device KIT 1 each by Does not apply route daily. 01/09/17   Debbrah Alar, NP  budesonide-formoterol (SYMBICORT) 160-4.5 MCG/ACT inhaler Inhale 2 puffs into the lungs 2 (two) times daily. 03/16/17   Colon Branch, MD  cetirizine (ZYRTEC) 10 MG tablet Take 10 mg by mouth daily.    [provider]  clotrimazole-betamethasone (LOTRISONE) cream Apply 1 application topically 2 (two) times daily. Patient taking differently: Apply 1 application topically 2 (two) times daily as needed.  11/21/16   Debbrah Alar, NP  dicyclomine (BENTYL) 20 MG tablet TAKE 1 TABLET BY MOUTH 4 TIMES DAILY BEFORE MEALS AND AT BEDTIME 10/17/16   Debbrah Alar, NP  escitalopram (LEXAPRO) 10 MG tablet Take 1 tablet (10 mg total) by mouth daily. 11/21/16   Debbrah Alar, NP  fluticasone (FLONASE) 50 MCG/ACT nasal spray Place 2 sprays into both nostrils daily as needed for allergies.     [provider]  furosemide (LASIX) 40 MG tablet Take 1 tablet (40 mg total) by mouth every morning. 10/17/16   Debbrah Alar, NP  glucose blood (ACCU-CHEK GUIDE) test strip Use as instructed to check blood sugar once a day. 01/09/17   Debbrah Alar, NP  losartan (COZAAR) 100 MG tablet Take 1 tablet (100 mg total) by mouth daily. 10/17/16   Debbrah Alar, NP  meloxicam (MOBIC) 15 MG tablet TAKE ONE TABLET BY MOUTH DAILY FOR 2 WEEKS THEN AS NEEDED WITH FOOD 04/03/17   Debbrah Alar, NP  metoprolol succinate (TOPROL-XL) 100 MG 24 hr tablet Take 1 tablet (100 mg total) daily by mouth. Take with or immediately following a meal. 06/09/17   Debbrah Alar, NP  mirtazapine (REMERON) 15 MG tablet Take 1 tablet (15 mg total) by mouth at bedtime. 10/17/16   Debbrah Alar, NP  montelukast (SINGULAIR) 10 MG tablet TAKE ONE TABLET BY MOUTH AT BEDTIME 04/26/17   Debbrah Alar, NP  Multiple Vitamins-Minerals  (MULTIVITAMIN WITH MINERALS) tablet Take 1 tablet by mouth daily.    [provider]  norgestimate-ethinyl estradiol (ORTHO-CYCLEN,SPRINTEC,PREVIFEM) 0.25-35 MG-MCG tablet Take 1 tablet by mouth every evening. 10/17/16   Debbrah Alar, NP  omeprazole (PRILOSEC) 20 MG capsule Take 77m before breakfast and 265mbefore supper. 10/17/16   O'Debbrah AlarNP  ondansetron (ZOFRAN ODT) 4 MG disintegrating tablet Take 1 tablet (4 mg total) every 8 (eight) hours as needed by mouth for nausea or vomiting. 06/12/17   Anagabriela Jokerst A, PA-C  ondansetron (ZOFRAN) 4 MG tablet Take 1 tablet (4 mg total) by mouth every 6 (six) hours. Patient taking differently: Take 4 mg by mouth every 8 (eight) hours as needed for nausea or vomiting.  02/21/17   PfCharlesetta ShanksMD  oxyCODONE (ROXICODONE) 5 MG immediate release tablet Take 1 tablet (5 mg total) every 6 (six) hours as needed by mouth for severe pain. 06/12/17   Eyvette Cordon, MiLaymond Purser  PA-C  Pancrelipase, Lip-Prot-Amyl, (CREON) 24000-76000 units CPEP Take 3 capsules (72,000 Units total) by mouth 3 (three) times daily before meals. 12/12/16   [place], Holli Humbles, NP  potassium chloride (K-DUR,KLOR-CON) 10 MEQ tablet Take 1 tablet (10 mEq total) by mouth every other day. 10/17/16   Debbrah Alar, NP  SUMAtriptan (IMITREX) 50 MG tablet Take 1 tablet (50 mg total) by mouth every 2 (two) hours as needed for migraine. May repeat in 2 hours if headache persists or recurs. 10/17/16   Debbrah Alar, NP  traZODone (DESYREL) 50 MG tablet Take 1-2 tablets (50-100 mg total) by mouth at bedtime. Patient taking differently: Take 50-100 mg by mouth at bedtime as needed.  10/17/16   Debbrah Alar, NP    Family History Family History  Problem Relation Age of Onset  . Asthma Mother   . Hypertension Mother   . Breast cancer Father 57  . Diabetes Father   . Hypertension Father   . Heart disease Father   . Hyperlipidemia Father   . Asthma Sister   .  Hyperlipidemia Brother   . Hypertension Brother   . Vision loss Brother   . Colon cancer Maternal Grandmother 91  . Liver cancer Maternal Grandmother   . Cervical cancer Maternal Aunt   . Stroke Paternal Uncle 65  . Melanoma Maternal Grandfather 61  . Colon polyps Maternal Grandfather   . AAA (abdominal aortic aneurysm) Paternal Grandmother   . Heart disease Paternal Grandfather   . Heart disease Paternal Uncle 38  . Colon cancer Other 12       MGM's mother  . Stomach cancer Other 100       MGM's mother  . Breast cancer Other        MGM's maternal aunt  . Cancer Other        MGM's maternal uncle with GI cancer  . Esophageal cancer Neg Hx   . Pancreatic cancer Neg Hx   . Rectal cancer Neg Hx     Social History Social History   Tobacco Use  . Smoking status: Former Smoker    Packs/day: 1.00    Years: 20.00    Pack years: 20.00    Types: Cigarettes    Last attempt to quit: 02/22/2013    Years since quitting: 4.3  . Smokeless tobacco: Never Used  Substance Use Topics  . Alcohol use: Yes    Alcohol/week: 0.0 oz    Comment: social  . Drug use: No     Allergies   Codeine; Adhesive [tape]; and Amlodipine   Review of Systems Review of Systems  Constitutional: Negative for chills and fever.  Respiratory: Negative for shortness of breath.   Cardiovascular: Negative for chest pain.  Gastrointestinal: Positive for abdominal pain. Negative for anorexia, constipation, diarrhea, flatus, hematochezia, melena, nausea and vomiting.  Genitourinary: Negative for dysuria, frequency, hematuria and vaginal discharge.  Musculoskeletal: Negative for arthralgias and myalgias.  Allergic/Immunologic: Positive for immunocompromised state.  Neurological: Negative for dizziness, weakness and headaches.   Physical Exam Updated Vital Signs BP (!) 161/94 (BP Location: Left Arm)   Pulse 85   Temp 98.7 F (37.1 C) (Oral)   Resp 18   Ht _0  (1.676 m)   Wt 122.5 kg (270 lb)   SpO2 96%    BMI 43.58 kg/m   Physical Exam  Constitutional: No distress.  HENT:  Head: Normocephalic.  Eyes: Conjunctivae are normal.  Neck: Neck supple.  Cardiovascular: Normal rate, regular rhythm, normal heart sounds  and intact distal pulses. Exam reveals no gallop and no friction rub.  No murmur heard. Pulmonary/Chest: Effort normal and breath sounds normal. No stridor. No respiratory distress. She has no wheezes. She has no rales. She exhibits no tenderness.  Abdominal: Soft. Bowel sounds are normal. She exhibits no distension and no mass. There is tenderness. There is guarding. There is no rebound. No hernia.  Severe tenderness to palpation over the right upper quadrant and right mid back.  The remainder of the abdominal exam is unremarkable.  Positive Murphy sign.  The abdomen appears mildly distended.  No tenderness over McBurney's point.  No left CVA tenderness.  Musculoskeletal: She exhibits tenderness. She exhibits no edema or deformity.  Neurological: She is alert.  Skin: Skin is warm. Capillary refill takes less than 2 seconds. No rash noted.  Psychiatric: Her behavior is normal.  Nursing note and vitals reviewed.    ED Treatments / Results  Labs (all labs ordered are listed, but only abnormal results are displayed) Labs Reviewed  COMPREHENSIVE METABOLIC PANEL - Abnormal; Notable for the following components:      Result Value   Potassium 3.4 (*)    Chloride 100 (*)    Glucose, Bld 115 (*)    AST 319 (*)    ALT 217 (*)    All other components within normal limits  CBC - Abnormal; Notable for the following components:   WBC 11.5 (*)    All other components within normal limits  URINALYSIS, ROUTINE W REFLEX MICROSCOPIC - Abnormal; Notable for the following components:   Leukocytes, UA TRACE (*)    All other components within normal limits  URINALYSIS, MICROSCOPIC (REFLEX) - Abnormal; Notable for the following components:   Bacteria, UA RARE (*)    Squamous Epithelial / LPF  0-5 (*)    All other components within normal limits  LIPASE, BLOOD    EKG  EKG Interpretation None       Radiology US Abdomen Limited Ruq  Result Date: 06/12/2017 CLINICAL DATA:  36 year old female with liver biopsy on 03/01/2017 for elevated liver enzymes. History of fatty liver. Patient presenting with right upper quadrant abdominal pain. EXAM: ULTRASOUND ABDOMEN LIMITED RIGHT UPPER QUADRANT COMPARISON:  Abdominal CT dated 03/04/2017 and ultrasound dated 03/01/2017 and ultrasound dated 11/25/2016 FINDINGS: Gallbladder: The gallbladder is unremarkable. There is no gallstone, gallbladder wall thickening, or pericholecystic fluid. Evaluation for sonographic Murphy's sign was limited as the patient was medicated. Common bile duct: Diameter: 6 mm Liver: There is diffuse increased liver echogenicity most consistent with fatty infiltration. Underlying fibrosis or superimposed inflammation is not excluded. Clinical correlation is recommended. There is a 12 x 6 x 8 mm hypoechoic lesion in the left lobe of the liver, not seen with certainty on the prior studies. This lesion is incompletely characterized. Further evaluation with MRI or repeat ultrasound in 3 months recommended. The visualized portions of the main portal vein appear patent with hepatopetal flow. IMPRESSION: 1. Fatty liver. 2. Indeterminate hypoechoic lesion in the left lobe of the liver. Further characterization with MRI or follow-up with ultrasound in 3 months recommended. 3. Unremarkable gallbladder. Electronically Signed   By: Anner Crete M.D.   On: 06/12/2017 20:47    Procedures Procedures (including critical care time)  Medications Ordered in ED Medications  HYDROmorphone (DILAUDID) injection 1 mg (1 mg Intravenous Given 06/12/17 1941)  ondansetron (ZOFRAN) injection 4 mg (4 mg Intravenous Given 06/12/17 1941)     Initial Impression / Assessment and Plan / ED  Course  I have reviewed the triage vital signs and the  nursing notes.  Pertinent labs & imaging results that were available during my care of the patient were reviewed by me and considered in my medical decision making (see chart for details).    36 year old female with a history of biliary dyskinesia, Nash, IBS, Barrett's esophagus, hypertension, and diabetes mellitus presenting with right upper quadrant pain that began this afternoon.  No associated symptoms.  AST 319 and ALT 217.  Alkaline phosphatase and total bilirubin are not elevated.  Mild leukocytosis of 11.8.  On physical exam, the patient has right upper quadrant tenderness with positive Murphy sign.  Right upper quadrant ultrasound does not demonstrate cholecystitis, but demonstrates a new hypoechoic lesion to the left liver.  These results have been discussed with the patient and she will follow-up with gastroenterology outpatient.  Discussed the patient with Dr. Leonette Monarch, attending physician.  Doubt pancreatitis, cholecystitis, diverticulitis, or acute congestive heart failure. we will discharge the patient to home with outpatient follow-up with pain control and nausea medication. A 61-monthprescription history query was performed using the [place] CSRS prior to discharge.  Strict return precautions given.  No acute distress.  The patient is safe for discharge at this time.   Final Clinical Impressions(s) / ED Diagnoses   Final diagnoses:  RUQ abdominal pain    ED Discharge Orders        Ordered    ondansetron (ZOFRAN ODT) 4 MG disintegrating tablet  Every 8 hours PRN     06/12/17 2222    oxyCODONE (ROXICODONE) 5 MG immediate release tablet  Every 6 hours PRN     06/12/17 2222       Dare Sanger A, PA-C 06/12/17 2231    CFatima Blank MD 06/14/17 0015

## 2017-06-13 ENCOUNTER — Telehealth: Payer: Self-pay | Admitting: Gastroenterology

## 2017-06-13 ENCOUNTER — Telehealth: Payer: Self-pay

## 2017-06-13 NOTE — Telephone Encounter (Signed)
-----   Message from Ladene Artist, MD sent at 06/13/2017  8:35 AM EST ----- Regarding: FW: [place] Results She needs an office appt with me or APP within the next 1- 2 weeks for liver lesion on [place], elevated LFTs and elevated ANA.  She has seen Jess before. We're changing our strategy going forward to make efforts to keep patients with 1 APP and 1 MD.   ----- Message ----- From: Joanne Gavel, PA-C Sent: 06/12/2017  10:16 PM To: Ladene Artist, MD, Fatima Blank, MD Subject: [place] Results                                     Hello Dr. Fuller Plan,  Violanda Bobeck was under my care in the emergency department at [facility] for severe right upper quadrant abdominal pain.  Her transaminases were more elevated than during her previous visits at 319 for her AST and 217 ALT.   I did a right upper quadrant ultrasound, which demonstrated a 12x6x8 mm hypoechoic lesion in the left liver.  Radiology recommended follow-up with an outpatient MRI.  I have discussed these results with [name], and she is planning to call you to schedule a follow-up appointment.   Thank you for your time, Mia McDonald PA-C

## 2017-06-13 NOTE — Telephone Encounter (Signed)
Patient scheduled to see Alonza Bogus, PA for 06/20/17 at 3:00pm. Patient notified and patient verbalized understanding.

## 2017-06-13 NOTE — Telephone Encounter (Signed)
See alternate note she has been scheduled to see extender 11/27

## 2017-06-14 ENCOUNTER — Encounter: Payer: Self-pay | Admitting: Family

## 2017-06-20 ENCOUNTER — Encounter: Payer: Self-pay | Admitting: Gastroenterology

## 2017-06-20 ENCOUNTER — Other Ambulatory Visit (INDEPENDENT_AMBULATORY_CARE_PROVIDER_SITE_OTHER): Payer: Managed Care, Other (non HMO)

## 2017-06-20 ENCOUNTER — Ambulatory Visit (INDEPENDENT_AMBULATORY_CARE_PROVIDER_SITE_OTHER): Payer: Managed Care, Other (non HMO) | Admitting: Gastroenterology

## 2017-06-20 VITALS — BP 138/84 | HR 74 | Ht 66.0 in | Wt 271.0 lb

## 2017-06-20 DIAGNOSIS — R197 Diarrhea, unspecified: Secondary | ICD-10-CM | POA: Diagnosis not present

## 2017-06-20 DIAGNOSIS — R945 Abnormal results of liver function studies: Secondary | ICD-10-CM

## 2017-06-20 DIAGNOSIS — R1011 Right upper quadrant pain: Secondary | ICD-10-CM | POA: Diagnosis not present

## 2017-06-20 DIAGNOSIS — K769 Liver disease, unspecified: Secondary | ICD-10-CM | POA: Diagnosis not present

## 2017-06-20 DIAGNOSIS — R7989 Other specified abnormal findings of blood chemistry: Secondary | ICD-10-CM

## 2017-06-20 HISTORY — DX: Diarrhea, unspecified: R19.7

## 2017-06-20 HISTORY — DX: Liver disease, unspecified: K76.9

## 2017-06-20 LAB — HEPATIC FUNCTION PANEL
ALT: 86 U/L — AB (ref 0–35)
AST: 66 U/L — AB (ref 0–37)
Albumin: 3.9 g/dL (ref 3.5–5.2)
Alkaline Phosphatase: 91 U/L (ref 39–117)
BILIRUBIN DIRECT: 0.1 mg/dL (ref 0.0–0.3)
TOTAL PROTEIN: 7.5 g/dL (ref 6.0–8.3)
Total Bilirubin: 0.4 mg/dL (ref 0.2–1.2)

## 2017-06-20 MED ORDER — COLESTIPOL HCL 1 G PO TABS
1.0000 g | ORAL_TABLET | Freq: Two times a day (BID) | ORAL | 2 refills | Status: DC
Start: 2017-06-20 — End: 2017-11-02

## 2017-06-20 NOTE — Progress Notes (Signed)
06/20/2017 LAYLI CAPSHAW [id number] 11/14/1980   HISTORY OF PRESENT ILLNESS  This is a pleasant 36 year old female with chronically elevated LFT's that have been extensively evaluated and she has been found to have steatohepatitis.  She's also had RUQ abdominal pains that have questionably been contributed to hyperkinetic gallbladder (EF 98% on HIDA scan).  Presented last week to UC with severe RUQ abdominal pain, further increase in AST and ALT, and a new liver lesion on ultrasound were noted.  Liver lesion not noted previously.  Ultrasound as follows:  IMPRESSION: 1. Fatty liver. 2. Indeterminate hypoechoic lesion in the left lobe of the liver. Further characterization with MRI or follow-up with ultrasound in 3 months recommended. 3. Unremarkable gallbladder.  AST was 319 and AST was 217.  Total bili and ALP remained normal.  Says that her RUQ abdominal pain was very sudden in onset and very severe.  Says that she was walking at work and it caused her to go to the floor when it hit.   Continues to have diarrhea.  Minimal improvement with creon previously.  Colonoscopy with random biopsies negative in the past.  Celiac testing negative.  Roderic Scarce in the past but cannot make herself take it despite several methods of trying.  Past Medical History:  Diagnosis Date  . Allergy   . Anxiety   . Aortic regurgitation due to bicuspid aortic valve   . Arthritis   . Borderline diabetes 10/20/2016  . Chronic kidney disease    kidney stones  . Depression   . Diabetes type 2, controlled ([place]) 10/20/2016  . Family history of breast cancer in female   . Family history of colon cancer   . Fatty liver 11/28/2016  . GERD (gastroesophageal reflux disease)   . Heart murmur   . Hypertension   . IBS (irritable bowel syndrome)   . Insomnia   . Migraine headache   . NASH (nonalcoholic steatohepatitis) 04/04/2017   Per biopsy 9/18  . OCD (obsessive compulsive disorder)   . Orbital  cellulitis    at age 79 was hospitalized for 3 months (almost died)   . OSA (obstructive sleep apnea)   . S/P appy 05/2013  . Sleep apnea    Past Surgical History:  Procedure Laterality Date  . APPENDECTOMY    . COLONOSCOPY    . DENTAL SURGERY     wisdom teeth  . ESOPHAGOGASTRODUODENOSCOPY    . LAPAROSCOPIC APPENDECTOMY N/A 06/06/2013   Procedure: APPENDECTOMY LAPAROSCOPIC;  Surgeon: Imogene Burn. Georgette Dover, MD;  Location: [facility];  Service: General;  Laterality: N/A;  . LIVER BIOPSY      reports that she quit smoking about 4 years ago. Her smoking use included cigarettes. She has a 20.00 pack-year smoking history. she has never used smokeless tobacco. She reports that she drinks alcohol. She reports that she does not use drugs. family history includes AAA (abdominal aortic aneurysm) in her paternal grandmother; Asthma in her mother and sister; Breast cancer in her other; Breast cancer (age of onset: 76) in her father; Cancer in her other; Cervical cancer in her maternal aunt; Colon cancer (age of onset: 22) in her maternal grandmother; Colon cancer (age of onset: 9) in her other; Colon polyps in her maternal grandfather; Diabetes in her father; Heart disease in her father and paternal grandfather; Heart disease (age of onset: 62) in her paternal uncle; Hyperlipidemia in her brother and father; Hypertension in her brother, father, and mother; Liver cancer in  her maternal grandmother; Melanoma (age of onset: 74) in her maternal grandfather; Stomach cancer (age of onset: 65) in her other; Stroke (age of onset: 48) in her paternal uncle; Vision loss in her brother. Allergies  Allergen Reactions  . Codeine Nausea Only    Needs antiemetic  . Adhesive [Tape]     Tapes, bandaids  . Amlodipine Other (See Comments)    EDEMA      Outpatient Encounter Medications as of 06/20/2017  Medication Sig  . ACCU-CHEK SOFTCLIX LANCETS lancets Use as instructed  . Blood Glucose Monitoring Suppl (ACCU-CHEK GUIDE)  w/Device KIT 1 each by Does not apply route daily.  . budesonide-formoterol (SYMBICORT) 160-4.5 MCG/ACT inhaler Inhale 2 puffs into the lungs 2 (two) times daily.  . cetirizine (ZYRTEC) 10 MG tablet Take 10 mg by mouth daily.  . clotrimazole-betamethasone (LOTRISONE) cream Apply 1 application topically 2 (two) times daily. (Patient taking differently: Apply 1 application topically 2 (two) times daily as needed. )  . dicyclomine (BENTYL) 20 MG tablet TAKE 1 TABLET BY MOUTH 4 TIMES DAILY BEFORE MEALS AND AT BEDTIME  . escitalopram (LEXAPRO) 10 MG tablet Take 1 tablet (10 mg total) by mouth daily.  . fluticasone (FLONASE) 50 MCG/ACT nasal spray Place 2 sprays into both nostrils daily as needed for allergies.   . furosemide (LASIX) 40 MG tablet Take 1 tablet (40 mg total) by mouth every morning.  Marland Kitchen glucose blood (ACCU-CHEK GUIDE) test strip Use as instructed to check blood sugar once a day.  . losartan (COZAAR) 100 MG tablet Take 1 tablet (100 mg total) by mouth daily.  . meloxicam (MOBIC) 15 MG tablet TAKE ONE TABLET BY MOUTH DAILY FOR 2 WEEKS THEN AS NEEDED WITH FOOD  . metoprolol succinate (TOPROL-XL) 100 MG 24 hr tablet Take 1 tablet (100 mg total) daily by mouth. Take with or immediately following a meal.  . mirtazapine (REMERON) 15 MG tablet Take 1 tablet (15 mg total) by mouth at bedtime.  . montelukast (SINGULAIR) 10 MG tablet TAKE ONE TABLET BY MOUTH AT BEDTIME  . Multiple Vitamins-Minerals (MULTIVITAMIN WITH MINERALS) tablet Take 1 tablet by mouth daily.  . norgestimate-ethinyl estradiol (ORTHO-CYCLEN,SPRINTEC,PREVIFEM) 0.25-35 MG-MCG tablet Take 1 tablet by mouth every evening.  Marland Kitchen omeprazole (PRILOSEC) 20 MG capsule Take 63m before breakfast and 220mbefore supper.  . ondansetron (ZOFRAN ODT) 4 MG disintegrating tablet Take 1 tablet (4 mg total) every 8 (eight) hours as needed by mouth for nausea or vomiting.  . Marland KitchenxyCODONE (ROXICODONE) 5 MG immediate release tablet Take 1 tablet (5 mg total)  every 6 (six) hours as needed by mouth for severe pain.  . potassium chloride (K-DUR,KLOR-CON) 10 MEQ tablet Take 1 tablet (10 mEq total) by mouth every other day.  . SUMAtriptan (IMITREX) 50 MG tablet Take 1 tablet (50 mg total) by mouth every 2 (two) hours as needed for migraine. May repeat in 2 hours if headache persists or recurs.  . traZODone (DESYREL) 50 MG tablet Take 1-2 tablets (50-100 mg total) by mouth at bedtime. (Patient taking differently: Take 50-100 mg by mouth at bedtime as needed. )  . [DISCONTINUED] ondansetron (ZOFRAN) 4 MG tablet Take 1 tablet (4 mg total) by mouth every 6 (six) hours. (Patient taking differently: Take 4 mg by mouth every 8 (eight) hours as needed for nausea or vomiting. )  . [DISCONTINUED] Pancrelipase, Lip-Prot-Amyl, (CREON) 24000-76000 units CPEP Take 3 capsules (72,000 Units total) by mouth 3 (three) times daily before meals.   Facility-Administered Encounter Medications  as of 06/20/2017  Medication  . 0.9 %  sodium chloride infusion     REVIEW OF SYSTEMS  : All other systems reviewed and negative except where noted in the History of Present Illness.   PHYSICAL EXAM: BP 138/84   Pulse 74   Ht _0  (1.676 m)   Wt 271 lb (122.9 kg)   BMI 43.74 kg/m  General: Well developed white female in no acute distress Head: Normocephalic and atraumatic Eyes:  Sclerae anicteric, conjunctiva pink. Ears: Normal auditory acuity Lungs: Clear throughout to auscultation; no increased WOB. Heart: Regular rate and rhythm; no M/R/G. Abdomen: Soft, non-distended.  BS present.  Moderate RUQ TTP. Musculoskeletal: Symmetrical with no gross deformities  Skin: No lesions on visible extremities Extremities: No edema  Neurological: Alert oriented x 4, grossly non-focal Psychological:  Alert and cooperative. Normal mood and affect  ASSESSMENT AND PLAN: *35 year old female with chronically elevated LFT's that have been extensively evaluated and she has been found to have  steatohepatitis.  She's also had RUQ abdominal pains that have questionably been contributed to hyperkinetic gallbladder.  Presented last week to UC with severe RUQ abdominal pain, further increase in AST and ALT, and a new liver lesion on ultrasound were noted.  Will recheck LFT's.  Will order MRI abdomen/MRCP. *Chronic diarrhea:  Extensive evaluation has been unremarkable.  Will try colestid BID (cannot tolerate questran).  Minimal improvement with Creon previously.   CC:  Debbrah Alar, NP

## 2017-06-20 NOTE — Progress Notes (Signed)
Reviewed and agree with initial management plan.  Malcolm T. Stark, MD FACG 

## 2017-06-20 NOTE — Patient Instructions (Signed)
You have been scheduled for an MRI at [facility]  on 06/26/17. Your appointment time is 1:00 pm. Please arrive 15 minutes prior to your appointment time for registration purposes. Please make certain not to have anything to eat or drink 6 hours prior to your test. In addition, if you have any metal in your body, have a pacemaker or defibrillator, please be sure to let your ordering physician know. This test typically takes 45 minutes to 1 hour to complete. Should you need to reschedule, please call [phone] to do so.  Your physician has requested that you go to the basement for the following lab work before leaving today:  We have sent the following medications to your pharmacy for you to pick up at your convenience: Colestid  If you are age 38 or older, your body mass index should be between 23-30. Your Body mass index is 43.74 kg/m. If this is out of the aforementioned range listed, please consider follow up with your Primary Care Provider.  If you are age 80 or younger, your body mass index should be between 19-25. Your Body mass index is 43.74 kg/m. If this is out of the aformentioned range listed, please consider follow up with your Primary Care Provider.

## 2017-06-21 ENCOUNTER — Other Ambulatory Visit: Payer: Self-pay | Admitting: Family

## 2017-06-22 ENCOUNTER — Other Ambulatory Visit: Payer: Self-pay | Admitting: Family

## 2017-06-26 ENCOUNTER — Ambulatory Visit (HOSPITAL_COMMUNITY)
Admission: RE | Admit: 2017-06-26 | Discharge: 2017-06-26 | Disposition: A | Discharge status: Home / Self Care | Payer: Managed Care, Other (non HMO) | Source: Ambulatory Visit | Attending: Gastroenterology | Admitting: Gastroenterology

## 2017-06-26 ENCOUNTER — Other Ambulatory Visit: Payer: Self-pay | Admitting: Gastroenterology

## 2017-06-26 DIAGNOSIS — K760 Fatty (change of) liver, not elsewhere classified: Secondary | ICD-10-CM | POA: Diagnosis not present

## 2017-06-26 DIAGNOSIS — K7689 Other specified diseases of liver: Secondary | ICD-10-CM | POA: Insufficient documentation

## 2017-06-26 DIAGNOSIS — K769 Liver disease, unspecified: Secondary | ICD-10-CM | POA: Diagnosis present

## 2017-06-26 DIAGNOSIS — R945 Abnormal results of liver function studies: Secondary | ICD-10-CM | POA: Diagnosis not present

## 2017-06-26 DIAGNOSIS — R7989 Other specified abnormal findings of blood chemistry: Secondary | ICD-10-CM

## 2017-06-26 MED ORDER — GADOBENATE DIMEGLUMINE 529 MG/ML IV SOLN: 20.0000 mL | Freq: Once | INTRAVENOUS | Status: DC | PRN

## 2017-06-27 ENCOUNTER — Other Ambulatory Visit: Payer: Self-pay

## 2017-06-27 ENCOUNTER — Telehealth: Payer: Self-pay | Admitting: Gastroenterology

## 2017-06-27 ENCOUNTER — Ambulatory Visit (HOSPITAL_BASED_OUTPATIENT_CLINIC_OR_DEPARTMENT_OTHER): Payer: Managed Care, Other (non HMO)

## 2017-06-27 VITALS — BP 106/64 | HR 72 | Temp 99.0°F | Resp 20

## 2017-06-27 DIAGNOSIS — D500 Iron deficiency anemia secondary to blood loss (chronic): Secondary | ICD-10-CM

## 2017-06-27 DIAGNOSIS — D509 Iron deficiency anemia, unspecified: Secondary | ICD-10-CM

## 2017-06-27 MED ORDER — SODIUM CHLORIDE 0.9 % IV SOLN
510.0000 mg | Freq: Once | INTRAVENOUS | Status: AC
  Administered 2017-06-27: 510 mg via INTRAVENOUS
  Filled 2017-06-27: qty 17

## 2017-06-27 MED ORDER — SODIUM CHLORIDE 0.9 % IV SOLN
Freq: Once | INTRAVENOUS | Status: AC
  Administered 2017-06-27: 16:00:00 via INTRAVENOUS

## 2017-06-27 MED ORDER — GADOBENATE DIMEGLUMINE 529 MG/ML IV SOLN
20.0000 mL | Freq: Once | INTRAVENOUS | Status: AC | PRN
  Administered 2017-06-26: 20 mL via INTRAVENOUS

## 2017-06-27 MED ORDER — SODIUM CHLORIDE 0.9% FLUSH
3.0000 mL | Freq: Once | INTRAVENOUS | Status: DC | PRN
  Filled 2017-06-27: qty 10

## 2017-06-27 MED ORDER — SODIUM CHLORIDE 0.9% FLUSH
10.0000 mL | INTRAVENOUS | Status: DC | PRN
  Filled 2017-06-27: qty 10

## 2017-06-27 NOTE — Patient Instructions (Signed)

## 2017-06-27 NOTE — Telephone Encounter (Signed)
Stephanie Miles the pt is calling requesting MRI results

## 2017-06-28 ENCOUNTER — Encounter: Payer: Self-pay | Admitting: Family

## 2017-06-28 ENCOUNTER — Telehealth: Payer: Self-pay | Admitting: Gastroenterology

## 2017-06-28 ENCOUNTER — Encounter: Payer: Self-pay | Admitting: Gastroenterology

## 2017-06-28 NOTE — Telephone Encounter (Signed)
Message sent to the pt

## 2017-06-28 NOTE — Telephone Encounter (Signed)
Please let her know that her MRI actually showed 4 liver lesions.  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  They are recommending follow-up MRI abdomen with/without contrast in 6 months.  They are also recommending discontinuation of her OCP during the next 6 months as well as OCP's can affect FNH.  Please ask her to do so and place her in for recall for MRI abdomen with/without Eovist contrast in 6 months.  How is she feeling?  How is her pain?  There was nothing seen to necessarily account for her pain.  Thank you,  Jess

## 2017-06-28 NOTE — Telephone Encounter (Signed)
No concerning features for cancer.  We discussed again about her seeing a surgeon about her gallbladder.  Please see if she is interested in doing that and place referral to [facility] if she is interested.  Thank you,  Jess

## 2017-06-28 NOTE — Telephone Encounter (Signed)
The pt states the pain in the RUQ is worse after eating. This is the same pain she had when she saw [place].  She agrees to have repeat MRI in 6 months.  Please advise she is very concerned because of a family history of liver cancer

## 2017-06-28 NOTE — Telephone Encounter (Signed)
Left message on machine to call back  

## 2017-06-28 NOTE — Telephone Encounter (Signed)
See alternate phone note

## 2017-06-29 ENCOUNTER — Other Ambulatory Visit: Payer: Self-pay | Admitting: Family

## 2017-06-29 DIAGNOSIS — K769 Liver disease, unspecified: Secondary | ICD-10-CM

## 2017-06-29 NOTE — Telephone Encounter (Signed)
Janett Billow please advise regarding referral.

## 2017-06-29 NOTE — Telephone Encounter (Signed)
Go ahead and refer her for elevated LFT's, RUQ abdominal pain, liver lesions.  Please send office notes and all imaging, liver biopsy results (pathology), etc.

## 2017-07-05 ENCOUNTER — Other Ambulatory Visit: Payer: Managed Care, Other (non HMO)

## 2017-07-06 ENCOUNTER — Other Ambulatory Visit: Payer: Self-pay | Admitting: Family

## 2017-07-06 DIAGNOSIS — K769 Liver disease, unspecified: Secondary | ICD-10-CM

## 2017-07-07 ENCOUNTER — Other Ambulatory Visit (HOSPITAL_BASED_OUTPATIENT_CLINIC_OR_DEPARTMENT_OTHER): Payer: Managed Care, Other (non HMO)

## 2017-07-07 DIAGNOSIS — D509 Iron deficiency anemia, unspecified: Secondary | ICD-10-CM | POA: Diagnosis not present

## 2017-07-07 DIAGNOSIS — K769 Liver disease, unspecified: Secondary | ICD-10-CM

## 2017-07-07 LAB — CBC WITH DIFFERENTIAL (CANCER CENTER ONLY)
BASO#: 0.1 10*3/uL (ref 0.0–0.2)
BASO%: 0.7 % (ref 0.0–2.0)
EOS ABS: 0.5 10*3/uL (ref 0.0–0.5)
EOS%: 4.8 % (ref 0.0–7.0)
HEMATOCRIT: 38.3 % (ref 34.8–46.6)
HEMOGLOBIN: 12.9 g/dL (ref 11.6–15.9)
LYMPH#: 3.3 10*3/uL (ref 0.9–3.3)
LYMPH%: 30 % (ref 14.0–48.0)
MCH: 30 pg (ref 26.0–34.0)
MCHC: 33.7 g/dL (ref 32.0–36.0)
MCV: 89 fL (ref 81–101)
MONO#: 0.7 10*3/uL (ref 0.1–0.9)
MONO%: 6.6 % (ref 0.0–13.0)
NEUT%: 57.9 % (ref 39.6–80.0)
NEUTROS ABS: 6.4 10*3/uL (ref 1.5–6.5)
Platelets: 244 10*3/uL (ref 145–400)
RBC: 4.3 10*6/uL (ref 3.70–5.32)
RDW: 13.5 % (ref 11.1–15.7)
WBC: 11 10*3/uL — ABNORMAL HIGH (ref 3.9–10.0)

## 2017-07-07 LAB — CMP (CANCER CENTER ONLY)
ALBUMIN: 3.1 g/dL — AB (ref 3.3–5.5)
ALT(SGPT): 143 U/L — ABNORMAL HIGH (ref 10–47)
AST: 120 U/L — ABNORMAL HIGH (ref 11–38)
Alkaline Phosphatase: 92 U/L — ABNORMAL HIGH (ref 26–84)
BUN, Bld: 11 mg/dL (ref 7–22)
CALCIUM: 9.2 mg/dL (ref 8.0–10.3)
CHLORIDE: 104 meq/L (ref 98–108)
CO2: 28 meq/L (ref 18–33)
CREATININE: 1.1 mg/dL (ref 0.6–1.2)
Glucose, Bld: 173 mg/dL — ABNORMAL HIGH (ref 73–118)
Potassium: 3.9 mEq/L (ref 3.3–4.7)
SODIUM: 142 meq/L (ref 128–145)
Total Bilirubin: 0.6 mg/dl (ref 0.20–1.60)
Total Protein: 7.4 g/dL (ref 6.4–8.1)

## 2017-07-08 LAB — AFP TUMOR MARKER: AFP, SERUM, TUMOR MARKER: 4.1 ng/mL (ref 0.0–8.3)

## 2017-07-31 ENCOUNTER — Encounter: Payer: Self-pay | Admitting: Family

## 2017-07-31 ENCOUNTER — Ambulatory Visit (INDEPENDENT_AMBULATORY_CARE_PROVIDER_SITE_OTHER): Payer: Managed Care, Other (non HMO) | Admitting: Family

## 2017-07-31 VITALS — BP 141/93 | HR 93 | Temp 99.0°F | Resp 16 | Ht 66.0 in | Wt 275.0 lb

## 2017-07-31 DIAGNOSIS — J209 Acute bronchitis, unspecified: Secondary | ICD-10-CM | POA: Diagnosis not present

## 2017-07-31 DIAGNOSIS — R5383 Other fatigue: Secondary | ICD-10-CM

## 2017-07-31 DIAGNOSIS — K529 Noninfective gastroenteritis and colitis, unspecified: Secondary | ICD-10-CM | POA: Diagnosis not present

## 2017-07-31 DIAGNOSIS — E119 Type 2 diabetes mellitus without complications: Secondary | ICD-10-CM | POA: Diagnosis not present

## 2017-07-31 LAB — POCT INFLUENZA A/B
INFLUENZA A, POC: NEGATIVE
INFLUENZA B, POC: NEGATIVE

## 2017-07-31 MED ORDER — AZITHROMYCIN 250 MG PO TABS: ORAL_TABLET | ORAL | 0 refills | Status: DC

## 2017-07-31 MED ORDER — PREDNISONE 10 MG PO TABS: ORAL_TABLET | ORAL | 0 refills | Status: DC

## 2017-07-31 NOTE — Progress Notes (Signed)
Subjective:    Patient ID: Stephanie Miles, female    DOB: Aug 07, 1980, 37 y.o.   MRN: [id number]  HPI  Pt is a 37 yr old female who presents today with several complaints and also for follow up.  1) Asthma-  Reports that she has had some wheezing the past few days. Saw the Allergist and was told that she was allergic to mold, cockroaches and dustmites. Has decided to hold off on immunotherapy.   2) HTN-  BP Readings from Last 3 Encounters:  07/31/17 (!) 141/93  06/27/17 106/64  06/20/17 138/84   3) Myalgia- notes generalized body aches.  HA and vomiting x 3 days. Reports + low grade temp.  + nausea/vomitting. Bilious vomit. She is drinking water.  Chicken soup/saltines. Reports that she been very fatigued and easily winded the last few days. Slept most of the day.   Review of Systems    see HPI  Past Medical History:  Diagnosis Date  . Allergy   . Anxiety   . Aortic regurgitation due to bicuspid aortic valve   . Arthritis   . Borderline diabetes 10/20/2016  . Chronic kidney disease    kidney stones  . Depression   . Diabetes type 2, controlled ([place]) 10/20/2016  . Family history of breast cancer in female   . Family history of colon cancer   . Fatty liver 11/28/2016  . GERD (gastroesophageal reflux disease)   . Heart murmur   . Hypertension   . IBS (irritable bowel syndrome)   . Insomnia   . Migraine headache   . NASH (nonalcoholic steatohepatitis) 04/04/2017   Per biopsy 9/18  . OCD (obsessive compulsive disorder)   . Orbital cellulitis    at age 89 was hospitalized for 3 months (almost died)   . OSA (obstructive sleep apnea)   . S/P appy 05/2013  . Sleep apnea      Social History   Socioeconomic History  . Marital status: Single    Spouse name: Not on file  . Number of children: 0  . Years of education: Not on file  . Highest education level: Not on file  Social Needs  . Financial resource strain: Not on file  . Food insecurity - worry: Not on file  . Food  insecurity - inability: Not on file  . Transportation needs - medical: Not on file  . Transportation needs - non-medical: Not on file  Occupational History  . Occupation: Ship broker  . Occupation: Programmer, applications  Tobacco Use  . Smoking status: Former Smoker    Packs/day: 1.00    Years: 20.00    Pack years: 20.00    Types: Cigarettes    Last attempt to quit: 02/22/2013    Years since quitting: 4.4  . Smokeless tobacco: Never Used  Substance and Sexual Activity  . Alcohol use: Yes    Alcohol/week: 0.0 oz    Comment: social  . Drug use: No  . Sexual activity: Not on file  Other Topics Concern  . Not on file  Social History Narrative   Working at [employer] doing [employer] alone (has a Neurosurgeon)   Working on a degree at [employer], will plan to return fall 2018   Enjoys spending time with family.   Friends and family in [place]    Past Surgical History:  Procedure Laterality Date  . APPENDECTOMY    . COLONOSCOPY    . DENTAL SURGERY  wisdom teeth  . ESOPHAGOGASTRODUODENOSCOPY    . LAPAROSCOPIC APPENDECTOMY N/A 06/06/2013   Procedure: APPENDECTOMY LAPAROSCOPIC;  Surgeon: Imogene Burn. Georgette Dover, MD;  Location: [facility];  Service: General;  Laterality: N/A;  . LIVER BIOPSY      Family History  Problem Relation Age of Onset  . Asthma Mother   . Hypertension Mother   . Breast cancer Father 2  . Diabetes Father   . Hypertension Father   . Heart disease Father   . Hyperlipidemia Father   . Asthma Sister   . Hyperlipidemia Brother   . Hypertension Brother   . Vision loss Brother   . Colon cancer Maternal Grandmother 69  . Liver cancer Maternal Grandmother   . Cervical cancer Maternal Aunt   . Stroke Paternal Uncle 59  . Melanoma Maternal Grandfather 56  . Colon polyps Maternal Grandfather   . AAA (abdominal aortic aneurysm) Paternal Grandmother   . Heart disease Paternal Grandfather   . Heart disease Paternal Uncle 14  . Colon cancer Other 69       MGM's mother  .  Stomach cancer Other 2       MGM's mother  . Breast cancer Other        MGM's maternal aunt  . Cancer Other        MGM's maternal uncle with GI cancer  . Esophageal cancer Neg Hx   . Pancreatic cancer Neg Hx   . Rectal cancer Neg Hx     Allergies  Allergen Reactions  . Codeine Nausea Only    Needs antiemetic  . Adhesive [Tape]     Tapes, bandaids  . Amlodipine Other (See Comments)    EDEMA    Current Outpatient Medications on File Prior to Visit  Medication Sig Dispense Refill  . ACCU-CHEK SOFTCLIX LANCETS lancets Use as instructed 100 each 1  . Blood Glucose Monitoring Suppl (ACCU-CHEK GUIDE) w/Device KIT 1 each by Does not apply route daily. 1 kit 0  . budesonide-formoterol (SYMBICORT) 160-4.5 MCG/ACT inhaler Inhale 2 puffs into the lungs 2 (two) times daily. 1 Inhaler 3  . cetirizine (ZYRTEC) 10 MG tablet Take 10 mg by mouth daily.    . clotrimazole-betamethasone (LOTRISONE) cream Apply 1 application topically 2 (two) times daily. (Patient taking differently: Apply 1 application topically 2 (two) times daily as needed. ) 30 g 1  . colestipol (COLESTID) 1 g tablet Take 1 tablet (1 g total) by mouth 2 (two) times daily. 60 tablet 2  . dicyclomine (BENTYL) 20 MG tablet TAKE 1 TABLET BY MOUTH 4 TIMES DAILY BEFORE MEALS AND AT BEDTIME 120 tablet 5  . escitalopram (LEXAPRO) 10 MG tablet TAKE ONE TABLET BY MOUTH DAILY 90 tablet 1  . fluticasone (FLONASE) 50 MCG/ACT nasal spray Place 2 sprays into both nostrils daily as needed for allergies.     . furosemide (LASIX) 40 MG tablet Take 1 tablet (40 mg total) by mouth every morning. 30 tablet 4  . glucose blood (ACCU-CHEK GUIDE) test strip Use as instructed to check blood sugar once a day. 100 each 1  . losartan (COZAAR) 100 MG tablet Take 1 tablet (100 mg total) by mouth daily. 30 tablet 4  . meloxicam (MOBIC) 15 MG tablet TAKE ONE TABLET BY MOUTH DAILY FOR 2 WEEKS THEN AS NEEDED WITH FOOD 30 tablet 5  . metoprolol succinate (TOPROL-XL)  100 MG 24 hr tablet Take 1 tablet (100 mg total) daily by mouth. Take with or immediately following a meal. 30  tablet 3  . mirtazapine (REMERON) 15 MG tablet TAKE ONE TABLET BY MOUTH AT BEDTIME 30 tablet 5  . montelukast (SINGULAIR) 10 MG tablet TAKE ONE TABLET BY MOUTH AT BEDTIME 30 tablet 5  . Multiple Vitamins-Minerals (MULTIVITAMIN WITH MINERALS) tablet Take 1 tablet by mouth daily.    . norgestimate-ethinyl estradiol (ORTHO-CYCLEN,SPRINTEC,PREVIFEM) 0.25-35 MG-MCG tablet Take 1 tablet by mouth every evening. 1 Package 11  . omeprazole (PRILOSEC) 20 MG capsule TAKE 2 CAPSULES BY MOUTH EVERY MORNING BEFORE BREAKFAST AND 1 CAPSULE EVERY EVENING BEFORE SUPPER 90 capsule 5  . ondansetron (ZOFRAN ODT) 4 MG disintegrating tablet Take 1 tablet (4 mg total) every 8 (eight) hours as needed by mouth for nausea or vomiting. 20 tablet 0  . oxyCODONE (ROXICODONE) 5 MG immediate release tablet Take 1 tablet (5 mg total) every 6 (six) hours as needed by mouth for severe pain. 12 tablet 0  . potassium chloride (K-DUR,KLOR-CON) 10 MEQ tablet TAKE ONE TABLET BY MOUTH EVERY OTHER DAY 15 tablet 5  . SUMAtriptan (IMITREX) 50 MG tablet Take 1 tablet (50 mg total) by mouth every 2 (two) hours as needed for migraine. May repeat in 2 hours if headache persists or recurs. 10 tablet 0  . traZODone (DESYREL) 50 MG tablet Take 1-2 tablets (50-100 mg total) by mouth at bedtime. (Patient taking differently: Take 50-100 mg by mouth at bedtime as needed. ) 60 tablet 5   Current Facility-Administered Medications on File Prior to Visit  Medication Dose Route Frequency Provider Last Rate Last Dose  . 0.9 %  sodium chloride infusion  500 mL Intravenous Continuous Ladene Artist, MD        BP (!) 141/93 (BP Location: Right Arm, Patient Position: Sitting, Cuff Size: Large)   Pulse 93   Temp 99 F (37.2 C) (Oral)   Resp 16   Ht _0  (1.676 m)   Wt 275 lb (124.7 kg)   SpO2 94%   BMI 44.39 kg/m    Objective:   Physical  Exam  Constitutional: She is oriented to person, place, and time. She appears well-developed and well-nourished.  Cardiovascular: Normal rate, regular rhythm and normal heart sounds.  No murmur heard. Pulmonary/Chest: Effort normal. No respiratory distress. She has wheezes.  Musculoskeletal: She exhibits no edema.  Neurological: She is alert and oriented to person, place, and time.  Psychiatric: She has a normal mood and affect. Her behavior is normal. Judgment and thought content normal.          Assessment & Plan:  Bronchitis with bronchospasm- Flu swab negative. start prednisone taper, zpak. Continue albuterol and symbicort. Call if new/worsening symptoms of if not improved in 3-4 days.   HTN- Fair BP today. Continue current meds.  BP Readings from Last 3 Encounters:  07/31/17 (!) 141/93  06/27/17 106/64  06/20/17 138/84   Gastroenteritis- likely viral. Obtain cmet to assess LFT's and lectrolytes.  Discussed supportive measures and to call if symptoms worsen or fail to improve in the next few days.

## 2017-07-31 NOTE — Patient Instructions (Addendum)
Begin prednisone taper for asthma flare. Begin zpak for bronchitis. Continue albuterol as needed and symbicort twice daily. Stay well hydrated. Call if new/worsening symptoms or if not improved in 3-4 days.

## 2017-08-01 LAB — COMPREHENSIVE METABOLIC PANEL
ALBUMIN: 3.9 g/dL (ref 3.5–5.2)
ALT: 111 U/L — ABNORMAL HIGH (ref 0–35)
AST: 139 U/L — ABNORMAL HIGH (ref 0–37)
Alkaline Phosphatase: 95 U/L (ref 39–117)
BUN: 12 mg/dL (ref 6–23)
CO2: 30 mEq/L (ref 19–32)
Calcium: 9.5 mg/dL (ref 8.4–10.5)
Chloride: 102 mEq/L (ref 96–112)
Creatinine, Ser: 0.9 mg/dL (ref 0.40–1.20)
GFR: 74.23 mL/min (ref >60.00)
Glucose, Bld: 98 mg/dL (ref 70–99)
POTASSIUM: 4.3 meq/L (ref 3.5–5.1)
Sodium: 140 mEq/L (ref 135–145)
TOTAL PROTEIN: 7.5 g/dL (ref 6.0–8.3)
Total Bilirubin: 0.4 mg/dL (ref 0.2–1.2)

## 2017-08-01 LAB — CBC WITH DIFFERENTIAL/PLATELET
Basophils Absolute: 0.1 10*3/uL (ref 0.0–0.1)
Basophils Relative: 1.2 % (ref 0.0–3.0)
Eosinophils Absolute: 0.6 10*3/uL (ref 0.0–0.7)
Eosinophils Relative: 5.6 % — ABNORMAL HIGH (ref 0.0–5.0)
HEMATOCRIT: 41.9 % (ref 36.0–46.0)
Hemoglobin: 14.1 g/dL (ref 12.0–15.0)
LYMPHS PCT: 33.9 % (ref 12.0–46.0)
Lymphs Abs: 3.6 10*3/uL (ref 0.7–4.0)
MCHC: 33.6 g/dL (ref 30.0–36.0)
MCV: 89.1 fl (ref 78.0–100.0)
MONOS PCT: 5.8 % (ref 3.0–12.0)
Monocytes Absolute: 0.6 10*3/uL (ref 0.1–1.0)
NEUTROS PCT: 53.5 % (ref 43.0–77.0)
Neutro Abs: 5.7 10*3/uL (ref 1.4–7.7)
Platelets: 258 10*3/uL (ref 150.0–400.0)
RBC: 4.7 Mil/uL (ref 3.87–5.11)
RDW: 14.1 % (ref 11.5–15.5)
WBC: 10.6 10*3/uL — ABNORMAL HIGH (ref 4.0–10.5)

## 2017-08-01 LAB — HEMOGLOBIN A1C: HEMOGLOBIN A1C: 6.9 % — AB (ref 4.6–6.5)

## 2017-08-02 ENCOUNTER — Encounter: Payer: Self-pay | Admitting: Family

## 2017-08-15 ENCOUNTER — Other Ambulatory Visit: Payer: Self-pay

## 2017-08-15 ENCOUNTER — Telehealth: Payer: Self-pay | Admitting: Medical

## 2017-08-15 ENCOUNTER — Encounter: Payer: Self-pay | Admitting: Family

## 2017-08-15 ENCOUNTER — Telehealth: Payer: Self-pay | Admitting: Family

## 2017-08-15 ENCOUNTER — Emergency Department (HOSPITAL_BASED_OUTPATIENT_CLINIC_OR_DEPARTMENT_OTHER)
Admission: EM | Admit: 2017-08-15 | Discharge: 2017-08-15 | Disposition: A | Discharge status: Home / Self Care | Payer: Managed Care, Other (non HMO) | Attending: Emergency Medicine | Admitting: Emergency Medicine

## 2017-08-15 DIAGNOSIS — J45909 Unspecified asthma, uncomplicated: Secondary | ICD-10-CM | POA: Insufficient documentation

## 2017-08-15 DIAGNOSIS — I129 Hypertensive chronic kidney disease with stage 1 through stage 4 chronic kidney disease, or unspecified chronic kidney disease: Secondary | ICD-10-CM | POA: Diagnosis not present

## 2017-08-15 DIAGNOSIS — N189 Chronic kidney disease, unspecified: Secondary | ICD-10-CM | POA: Diagnosis not present

## 2017-08-15 DIAGNOSIS — Z79899 Other long term (current) drug therapy: Secondary | ICD-10-CM | POA: Insufficient documentation

## 2017-08-15 DIAGNOSIS — E1165 Type 2 diabetes mellitus with hyperglycemia: Secondary | ICD-10-CM | POA: Insufficient documentation

## 2017-08-15 DIAGNOSIS — R739 Hyperglycemia, unspecified: Secondary | ICD-10-CM | POA: Diagnosis present

## 2017-08-15 DIAGNOSIS — F419 Anxiety disorder, unspecified: Secondary | ICD-10-CM | POA: Diagnosis not present

## 2017-08-15 DIAGNOSIS — Z87891 Personal history of nicotine dependence: Secondary | ICD-10-CM | POA: Diagnosis not present

## 2017-08-15 LAB — CBC WITH DIFFERENTIAL/PLATELET
BASOS ABS: 0.1 10*3/uL (ref 0.0–0.1)
Basophils Relative: 0 %
Eosinophils Absolute: 0.4 10*3/uL (ref 0.0–0.7)
Eosinophils Relative: 4 %
HCT: 39.6 % (ref 36.0–46.0)
Hemoglobin: 13.3 g/dL (ref 12.0–15.0)
LYMPHS ABS: 3.5 10*3/uL (ref 0.7–4.0)
LYMPHS PCT: 31 %
MCH: 29.4 pg (ref 26.0–34.0)
MCHC: 33.6 g/dL (ref 30.0–36.0)
MCV: 87.6 fL (ref 78.0–100.0)
Monocytes Absolute: 0.9 10*3/uL (ref 0.1–1.0)
Monocytes Relative: 8 %
NEUTROS PCT: 57 %
Neutro Abs: 6.7 10*3/uL (ref 1.7–7.7)
PLATELETS: 204 10*3/uL (ref 150–400)
RBC: 4.52 MIL/uL (ref 3.87–5.11)
RDW: 13.5 % (ref 11.5–15.5)
WBC: 11.6 10*3/uL — AB (ref 4.0–10.5)

## 2017-08-15 LAB — URINALYSIS, ROUTINE W REFLEX MICROSCOPIC
Bilirubin Urine: NEGATIVE
Glucose, UA: >=500 mg/dL — AB
Hgb urine dipstick: NEGATIVE
Ketones, ur: NEGATIVE mg/dL
LEUKOCYTES UA: NEGATIVE
NITRITE: NEGATIVE
PH: 6 (ref 5.0–8.0)
PROTEIN: NEGATIVE mg/dL
Specific Gravity, Urine: 1.025 (ref 1.005–1.030)

## 2017-08-15 LAB — COMPREHENSIVE METABOLIC PANEL
ALBUMIN: 3.7 g/dL (ref 3.5–5.0)
ALT: 134 U/L — ABNORMAL HIGH (ref 14–54)
ANION GAP: 9 (ref 5–15)
AST: 89 U/L — ABNORMAL HIGH (ref 15–41)
Alkaline Phosphatase: 117 U/L (ref 38–126)
BILIRUBIN TOTAL: 0.3 mg/dL (ref 0.3–1.2)
BUN: 15 mg/dL (ref 6–20)
CHLORIDE: 102 mmol/L (ref 101–111)
CO2: 24 mmol/L (ref 22–32)
Calcium: 9.1 mg/dL (ref 8.9–10.3)
Creatinine, Ser: 0.72 mg/dL (ref 0.44–1.00)
GFR calc Af Amer: >60 mL/min (ref >60)
GFR calc non Af Amer: >60 mL/min (ref >60)
GLUCOSE: 321 mg/dL — AB (ref 65–99)
POTASSIUM: 4 mmol/L (ref 3.5–5.1)
SODIUM: 135 mmol/L (ref 135–145)
Total Protein: 7.5 g/dL (ref 6.5–8.1)

## 2017-08-15 LAB — HEMOGLOBIN A1C
HEMOGLOBIN A1C: 7.4 % — AB (ref 4.8–5.6)
Mean Plasma Glucose: 165.68 mg/dL

## 2017-08-15 LAB — URINALYSIS, MICROSCOPIC (REFLEX): RBC / HPF: NONE SEEN RBC/hpf (ref 0–5)

## 2017-08-15 LAB — PREGNANCY, URINE: Preg Test, Ur: NEGATIVE

## 2017-08-15 LAB — LIPASE, BLOOD: Lipase: 36 U/L (ref 11–51)

## 2017-08-15 LAB — CBG MONITORING, ED
Glucose-Capillary: 352 mg/dL — ABNORMAL HIGH (ref 65–99)
Glucose-Capillary: 192 mg/dL — ABNORMAL HIGH (ref 65–99)

## 2017-08-15 MED ORDER — METFORMIN HCL ER 500 MG PO TB24: 750.0000 mg | ORAL_TABLET | Freq: Every day | ORAL | 0 refills | Status: DC

## 2017-08-15 MED ORDER — SODIUM CHLORIDE 0.9 % IV BOLUS (SEPSIS)
1000.0000 mL | Freq: Once | INTRAVENOUS | Status: AC
  Administered 2017-08-15: 1000 mL via INTRAVENOUS

## 2017-08-15 NOTE — Telephone Encounter (Signed)
Please see my my chart message sent to patient.  I called her and left a message expressing the same advisement.  However I would like you to call her and touch base with how she is feeling and get those blood sugar readings as requested by my chart message.

## 2017-08-15 NOTE — Telephone Encounter (Signed)
Please advise in absence of PCP.

## 2017-08-15 NOTE — Telephone Encounter (Signed)
Stephanie Miles called to report her blood sugar continues to go up, was 406 at noon. I do see the email communication with the office and informed her we are awaiting a response from [facility]. [facility], [place]. She stated she has not been sick since 1/7 when she had bronchitis. Denies any diet changes, recently.

## 2017-08-15 NOTE — Discharge Instructions (Signed)
Follow-up with Debbrah Alar your provider.  Call tomorrow for appointment.

## 2017-08-15 NOTE — Telephone Encounter (Signed)
Pt being seen in ER. Please see additional mychart message.

## 2017-08-15 NOTE — Telephone Encounter (Signed)
Pt currently being seen in ER. See additional mychart messages.

## 2017-08-15 NOTE — Care Management Note (Signed)
Case Management Note  CM consulted for outpt DM education needs.  Primary RN reports that she gave pt brocure on resources.  CM noted that PCP is aware of pt being in the ED and is planning to call her to schedule a follow up visit.  CM made a [facility] referral who can also assist with pt's DM education needs.  No further CM needs noted at this time.

## 2017-08-15 NOTE — ED Provider Notes (Signed)
[facility] EMERGENCY DEPARTMENT Provider Note   CSN: [id number] Arrival date & time: 08/15/17  1331     History   Chief Complaint Chief Complaint  Patient presents with  . Hyperglycemia    HPI Stephanie Miles is a 37 y.o. female.  Chief complaint is high blood sugar  HPI Stephanie Miles is 47.  She has been diagnosed with prediabetes.  Her last A1c was 6.9 a few months ago.  States she checks her sugars at home.  Sometimes they are as low as 160.  However, at times will be over 400.  She has polyuria and thirst.  Had a reading today that was over 400.  Called her provider and was referred here.  When asked specifically about her diet she does not describe a classic diabetic diet with local simple and complex carbohydrates.  A fair amount of fruit.  Has never been through diabetic education.  Past Medical History:  Diagnosis Date  . Allergy   . Anxiety   . Aortic regurgitation due to bicuspid aortic valve   . Arthritis   . Borderline diabetes 10/20/2016  . Chronic kidney disease    kidney stones  . Depression   . Diabetes type 2, controlled ([place]) 10/20/2016  . Family history of breast cancer in female   . Family history of colon cancer   . Fatty liver 11/28/2016  . GERD (gastroesophageal reflux disease)   . Heart murmur   . Hypertension   . IBS (irritable bowel syndrome)   . Insomnia   . Migraine headache   . NASH (nonalcoholic steatohepatitis) 04/04/2017   Per biopsy 9/18  . OCD (obsessive compulsive disorder)   . Orbital cellulitis    at age 54 was hospitalized for 3 months (almost died)   . OSA (obstructive sleep apnea)   . S/P appy 05/2013  . Sleep apnea     Patient Active Problem List   Diagnosis Date Noted  . Liver lesion 06/20/2017  . Diarrhea 06/20/2017  . NASH (nonalcoholic steatohepatitis) 04/04/2017  . Iron deficiency anemia due to chronic blood loss 03/21/2017  . Genetic testing 02/28/2017  . Family history of breast cancer in female   . Family  history of colon cancer   . Elevated LFTs 01/11/2017  . RUQ abdominal pain 01/11/2017  . PTSD (post-traumatic stress disorder) 12/28/2016  . Primary osteoarthritis of both knees 12/28/2016  . ANA positive 12/28/2016  . Leukocytosis 12/06/2016  . Fatty liver 11/28/2016  . History of kidney stones 10/21/2016  . Osteoarthritis 10/21/2016  . Diabetes type 2, controlled ([place]) 10/20/2016  . Cough variant asthma vs uacs  07/07/2015  . Severe obesity (BMI >= 40) ([place]) 07/07/2015  . Dyspnea 07/07/2015  . Asthma 05/27/2015  . Aortic regurgitation due to bicuspid aortic valve 03/12/2014  . Personal history of tobacco use, presenting hazards to health 01/20/2014  . Anxiety state 10/16/2013  . Migraine 07/08/2013  . Sleep apnea 07/08/2013  . IBS (irritable bowel syndrome) 06/06/2013  . GERD (gastroesophageal reflux disease) 06/06/2013  . HTN (hypertension) 06/06/2013    Past Surgical History:  Procedure Laterality Date  . APPENDECTOMY    . COLONOSCOPY    . DENTAL SURGERY     wisdom teeth  . ESOPHAGOGASTRODUODENOSCOPY    . LAPAROSCOPIC APPENDECTOMY N/A 06/06/2013   Procedure: APPENDECTOMY LAPAROSCOPIC;  Surgeon: Imogene Burn. Georgette Dover, MD;  Location: [facility];  Service: General;  Laterality: N/A;  . LIVER BIOPSY      OB History  No data available       Home Medications    Prior to Admission medications   Medication Sig Start Date End Date Taking? Authorizing Provider  ACCU-CHEK SOFTCLIX LANCETS lancets Use as instructed 01/09/17   Debbrah Alar, NP  azithromycin (ZITHROMAX Z-PAK) 250 MG tablet 2 tabs by mouth today, then one tab once daily for 4 more days 07/31/17   Debbrah Alar, NP  Blood Glucose Monitoring Suppl (ACCU-CHEK GUIDE) w/Device KIT 1 each by Does not apply route daily. 01/09/17   Debbrah Alar, NP  budesonide-formoterol (SYMBICORT) 160-4.5 MCG/ACT inhaler Inhale 2 puffs into the lungs 2 (two) times daily. 03/16/17   Colon Branch, MD  cetirizine (ZYRTEC) 10 MG  tablet Take 10 mg by mouth daily.    [provider]  clotrimazole-betamethasone (LOTRISONE) cream Apply 1 application topically 2 (two) times daily. Patient taking differently: Apply 1 application topically 2 (two) times daily as needed.  11/21/16   Debbrah Alar, NP  colestipol (COLESTID) 1 g tablet Take 1 tablet (1 g total) by mouth 2 (two) times daily. 06/20/17   Zehr, Laban Emperor, PA-C  dicyclomine (BENTYL) 20 MG tablet TAKE 1 TABLET BY MOUTH 4 TIMES DAILY BEFORE MEALS AND AT BEDTIME 10/17/16   Debbrah Alar, NP  escitalopram (LEXAPRO) 10 MG tablet TAKE ONE TABLET BY MOUTH DAILY 06/23/17   Debbrah Alar, NP  fluticasone (FLONASE) 50 MCG/ACT nasal spray Place 2 sprays into both nostrils daily as needed for allergies.     [provider]  furosemide (LASIX) 40 MG tablet Take 1 tablet (40 mg total) by mouth every morning. 10/17/16   Debbrah Alar, NP  glucose blood (ACCU-CHEK GUIDE) test strip Use as instructed to check blood sugar once a day. 01/09/17   Debbrah Alar, NP  losartan (COZAAR) 100 MG tablet Take 1 tablet (100 mg total) by mouth daily. 10/17/16   Debbrah Alar, NP  meloxicam (MOBIC) 15 MG tablet TAKE ONE TABLET BY MOUTH DAILY FOR 2 WEEKS THEN AS NEEDED WITH FOOD 04/03/17   Debbrah Alar, NP  metFORMIN (GLUCOPHAGE-XR) 500 MG 24 hr tablet Take 1.5 tablets (750 mg total) by mouth daily with breakfast. 08/15/17   Tanna Furry, MD  metoprolol succinate (TOPROL-XL) 100 MG 24 hr tablet Take 1 tablet (100 mg total) daily by mouth. Take with or immediately following a meal. 06/09/17   Debbrah Alar, NP  mirtazapine (REMERON) 15 MG tablet TAKE ONE TABLET BY MOUTH AT BEDTIME 06/23/17   Debbrah Alar, NP  montelukast (SINGULAIR) 10 MG tablet TAKE ONE TABLET BY MOUTH AT BEDTIME 04/26/17   Debbrah Alar, NP  Multiple Vitamins-Minerals (MULTIVITAMIN WITH MINERALS) tablet Take 1 tablet by mouth daily.    [provider]    norgestimate-ethinyl estradiol (ORTHO-CYCLEN,SPRINTEC,PREVIFEM) 0.25-35 MG-MCG tablet Take 1 tablet by mouth every evening. 10/17/16   Debbrah Alar, NP  omeprazole (PRILOSEC) 20 MG capsule TAKE 2 CAPSULES BY MOUTH EVERY MORNING BEFORE BREAKFAST AND 1 CAPSULE EVERY EVENING BEFORE SUPPER 06/23/17   Debbrah Alar, NP  ondansetron (ZOFRAN ODT) 4 MG disintegrating tablet Take 1 tablet (4 mg total) every 8 (eight) hours as needed by mouth for nausea or vomiting. 06/12/17   McDonald, Mia A, PA-C  oxyCODONE (ROXICODONE) 5 MG immediate release tablet Take 1 tablet (5 mg total) every 6 (six) hours as needed by mouth for severe pain. 06/12/17   McDonald, Mia A, PA-C  potassium chloride (K-DUR,KLOR-CON) 10 MEQ tablet TAKE ONE TABLET BY MOUTH EVERY OTHER DAY 06/23/17   Debbrah Alar,  NP  predniSONE (DELTASONE) 10 MG tablet 4 tabs by mouth once daily for 2 days, then 3 tabs daily x 2 days, then 2 tabs daily x 2 days, then 1 tab daily x 2 days 07/31/17   Debbrah Alar, NP  SUMAtriptan (IMITREX) 50 MG tablet Take 1 tablet (50 mg total) by mouth every 2 (two) hours as needed for migraine. May repeat in 2 hours if headache persists or recurs. 10/17/16   Debbrah Alar, NP  traZODone (DESYREL) 50 MG tablet Take 1-2 tablets (50-100 mg total) by mouth at bedtime. Patient taking differently: Take 50-100 mg by mouth at bedtime as needed.  10/17/16   Debbrah Alar, NP    Family History Family History  Problem Relation Age of Onset  . Asthma Mother   . Hypertension Mother   . Breast cancer Father 51  . Diabetes Father   . Hypertension Father   . Heart disease Father   . Hyperlipidemia Father   . Asthma Sister   . Hyperlipidemia Brother   . Hypertension Brother   . Vision loss Brother   . Colon cancer Maternal Grandmother 94  . Liver cancer Maternal Grandmother   . Cervical cancer Maternal Aunt   . Stroke Paternal Uncle 39  . Melanoma Maternal Grandfather 38  . Colon polyps  Maternal Grandfather   . AAA (abdominal aortic aneurysm) Paternal Grandmother   . Heart disease Paternal Grandfather   . Heart disease Paternal Uncle 25  . Colon cancer Other 79       MGM's mother  . Stomach cancer Other 76       MGM's mother  . Breast cancer Other        MGM's maternal aunt  . Cancer Other        MGM's maternal uncle with GI cancer  . Esophageal cancer Neg Hx   . Pancreatic cancer Neg Hx   . Rectal cancer Neg Hx     Social History Social History   Tobacco Use  . Smoking status: Former Smoker    Packs/day: 1.00    Years: 20.00    Pack years: 20.00    Types: Cigarettes    Last attempt to quit: 02/22/2013    Years since quitting: 4.4  . Smokeless tobacco: Never Used  Substance Use Topics  . Alcohol use: Yes    Alcohol/week: 0.0 oz    Comment: social  . Drug use: No     Allergies   Codeine; Adhesive [tape]; and Amlodipine   Review of Systems Review of Systems  Constitutional: Negative for appetite change, chills, diaphoresis, fatigue and fever.  HENT: Negative for mouth sores, sore throat and trouble swallowing.   Eyes: Negative for visual disturbance.  Respiratory: Negative for cough, chest tightness, shortness of breath and wheezing.   Cardiovascular: Negative for chest pain.  Gastrointestinal: Negative for abdominal distention, abdominal pain, diarrhea, nausea and vomiting.  Endocrine: Positive for polydipsia, polyphagia and polyuria.  Genitourinary: Negative for dysuria, frequency and hematuria.  Musculoskeletal: Negative for gait problem.  Skin: Negative for color change, pallor and rash.  Neurological: Negative for dizziness, syncope, light-headedness and headaches.  Hematological: Does not bruise/bleed easily.  Psychiatric/Behavioral: Negative for behavioral problems and confusion.     Physical Exam Updated Vital Signs BP 138/88 (BP Location: Left Arm)   Pulse 88   Temp 98.5 F (36.9 C) (Oral)   Resp 18   Ht 5' 6"  (1.676 m)   LMP  08/15/2017 (LMP Unknown)   SpO2 97%  BMI 44.39 kg/m   Physical Exam  Constitutional: She is oriented to person, place, and time. She appears well-developed and well-nourished. No distress.  HENT:  Head: Normocephalic.  Eyes: Conjunctivae are normal. Pupils are equal, round, and reactive to light. No scleral icterus.  Neck: Normal range of motion. Neck supple. No thyromegaly present.  Cardiovascular: Normal rate and regular rhythm. Exam reveals no gallop and no friction rub.  No murmur heard. Pulmonary/Chest: Effort normal and breath sounds normal. No respiratory distress. She has no wheezes. She has no rales.  Abdominal: Soft. Bowel sounds are normal. She exhibits no distension. There is no tenderness. There is no rebound.  Musculoskeletal: Normal range of motion.  Neurological: She is alert and oriented to person, place, and time.  Skin: Skin is warm and dry. No rash noted.  Psychiatric: She has a normal mood and affect. Her behavior is normal.     ED Treatments / Results  Labs (all labs ordered are listed, but only abnormal results are displayed) Labs Reviewed  CBC WITH DIFFERENTIAL/PLATELET - Abnormal; Notable for the following components:      Result Value   WBC 11.6 (*)    All other components within normal limits  COMPREHENSIVE METABOLIC PANEL - Abnormal; Notable for the following components:   Glucose, Bld 321 (*)    AST 89 (*)    ALT 134 (*)    All other components within normal limits  URINALYSIS, ROUTINE W REFLEX MICROSCOPIC - Abnormal; Notable for the following components:   Glucose, UA >=500 (*)    All other components within normal limits  URINALYSIS, MICROSCOPIC (REFLEX) - Abnormal; Notable for the following components:   Bacteria, UA RARE (*)    Squamous Epithelial / LPF 0-5 (*)    All other components within normal limits  CBG MONITORING, ED - Abnormal; Notable for the following components:   Glucose-Capillary 352 (*)    All other components within normal  limits  CBG MONITORING, ED - Abnormal; Notable for the following components:   Glucose-Capillary 192 (*)    All other components within normal limits  LIPASE, BLOOD  PREGNANCY, URINE  HEMOGLOBIN A1C    EKG  EKG Interpretation None       Radiology No results found.  Procedures Procedures (including critical care time)  Medications Ordered in ED Medications  sodium chloride 0.9 % bolus 1,000 mL (1,000 mLs Intravenous New Bag/Given 08/15/17 1538)     Initial Impression / Assessment and Plan / ED Course  I have reviewed the triage vital signs and the nursing notes.  Pertinent labs & imaging results that were available during my care of the patient were reviewed by me and considered in my medical decision making (see chart for details).     Given IV fluids and blood sugar improved to 192 without medical treatment.  I have given her prescription for once per day Glucophage 500 ER.  She follows with Debbrah Alar upstairs here.  Of asked her to call for an appointment.  Increase fluids.  Feel Glucophage is a safe choice as it will hopefully help hyperglycemia but not make her susceptible to hypoglycemia.  Push fluids.  Early follow-up.  Repeat hemoglobin A1c sent for primary care review.  Final Clinical Impressions(s) / ED Diagnoses   Final diagnoses:  Hyperglycemia    ED Discharge Orders        Ordered    metFORMIN (GLUCOPHAGE-XR) 500 MG 24 hr tablet  Daily with breakfast     08/15/17  Micki Riley, MD 08/15/17 1806

## 2017-08-15 NOTE — Telephone Encounter (Signed)
Pt currently being evaluated in the ER and will usually schedule follow up with PCP after that visit. Will call pt tomorrow to arrange.

## 2017-08-15 NOTE — Telephone Encounter (Signed)
See pt email from [place], [place] that was read by pt at 1:34pm today. She is currently being checked in to the ER.

## 2017-08-15 NOTE — ED Triage Notes (Signed)
Pt. Reports she feels like her blood sugar is high.  Pt. Reports she called her PCP Elby Beck and was told to come to ED.  Pt. Talks nonstop in triage. RN unable to ask questions for verification of Pt. Need for triage and assessment.  Pt. In no distress.  Pt. Continues to reports what she has been eating.

## 2017-08-21 ENCOUNTER — Encounter: Payer: Self-pay | Admitting: Family

## 2017-08-21 ENCOUNTER — Ambulatory Visit (INDEPENDENT_AMBULATORY_CARE_PROVIDER_SITE_OTHER): Payer: Managed Care, Other (non HMO) | Admitting: Family

## 2017-08-21 VITALS — BP 149/88 | HR 90 | Temp 98.5°F | Resp 16 | Ht 66.0 in | Wt 277.0 lb

## 2017-08-21 DIAGNOSIS — E1165 Type 2 diabetes mellitus with hyperglycemia: Secondary | ICD-10-CM | POA: Diagnosis not present

## 2017-08-21 MED ORDER — ACCU-CHEK FASTCLIX LANCETS MISC: 3 refills | Status: DC

## 2017-08-21 MED ORDER — METFORMIN HCL ER 500 MG PO TB24: 1000.0000 mg | ORAL_TABLET | Freq: Every day | ORAL | 2 refills | Status: DC

## 2017-08-21 MED ORDER — GLUCOSE BLOOD VI STRP: ORAL_STRIP | 1 refills | Status: DC

## 2017-08-21 NOTE — Patient Instructions (Signed)
Increase metformin to 1010m once daily. You will be contacted about your referral to Dr. AElyse Hsu Send me your readings via mychart (glucose) in 1 week.

## 2017-08-21 NOTE — Progress Notes (Signed)
Subjective:    Patient ID: Stephanie Miles, female    DOB: 1980-10-31, 37 y.o.   MRN: [id number]  HPI   Stephanie Miles reports that she began Metforim on 1/23. Home cbg's as follows: 1/24 263, 170. 1/25 169, 210, 265 1/26 245, 188,  1/27 175 at 2 AM. 168 fasting, 152  In the afternoon. 1/29 was 396 this AM 209 after breakfast and 111 at [facility]  Friday and Saturday night fell asleep and did not take PM meds.  Took last night. Frustrated with continued weight gain.     Wt Readings from Last 3 Encounters:  08/21/17 277 lb (125.6 kg)  07/31/17 275 lb (124.7 kg)  06/20/17 271 lb (122.9 kg)     Review of Systems See HPI  Past Medical History:  Diagnosis Date  . Allergy   . Anxiety   . Aortic regurgitation due to bicuspid aortic valve   . Arthritis   . Borderline diabetes 10/20/2016  . Chronic kidney disease    kidney stones  . Depression   . Diabetes type 2, controlled ([place]) 10/20/2016  . Family history of breast cancer in female   . Family history of colon cancer   . Fatty liver 11/28/2016  . GERD (gastroesophageal reflux disease)   . Heart murmur   . Hypertension   . IBS (irritable bowel syndrome)   . Insomnia   . Migraine headache   . NASH (nonalcoholic steatohepatitis) 04/04/2017   Per biopsy 9/18  . OCD (obsessive compulsive disorder)   . Orbital cellulitis    at age 6 was hospitalized for 3 months (almost died)   . OSA (obstructive sleep apnea)   . S/P appy 05/2013  . Sleep apnea      Social History   Socioeconomic History  . Marital status: Single    Spouse name: Not on file  . Number of children: 0  . Years of education: Not on file  . Highest education level: Not on file  Social Needs  . Financial resource strain: Not on file  . Food insecurity - worry: Not on file  . Food insecurity - inability: Not on file  . Transportation needs - medical: Not on file  . Transportation needs - non-medical: Not on file  Occupational History  . Occupation: Ship broker    . Occupation: Programmer, applications  Tobacco Use  . Smoking status: Former Smoker    Packs/day: 1.00    Years: 20.00    Pack years: 20.00    Types: Cigarettes    Last attempt to quit: 02/22/2013    Years since quitting: 4.5  . Smokeless tobacco: Never Used  Substance and Sexual Activity  . Alcohol use: Yes    Alcohol/week: 0.0 oz    Comment: social  . Drug use: No  . Sexual activity: Not on file  Other Topics Concern  . Not on file  Social History Narrative   Working at [employer] doing [employer] alone (has a Neurosurgeon)   Working on a degree at [employer], will plan to return fall 2018   Enjoys spending time with family.   Friends and family in [place]    Past Surgical History:  Procedure Laterality Date  . APPENDECTOMY    . COLONOSCOPY    . DENTAL SURGERY     wisdom teeth  . ESOPHAGOGASTRODUODENOSCOPY    . LAPAROSCOPIC APPENDECTOMY N/A 06/06/2013   Procedure: APPENDECTOMY LAPAROSCOPIC;  Surgeon: Imogene Burn. Georgette Dover, MD;  Location: [facility]  [facility];  Service: General;  Laterality: N/A;  . LIVER BIOPSY      Family History  Problem Relation Age of Onset  . Asthma Mother   . Hypertension Mother   . Breast cancer Father 88  . Diabetes Father   . Hypertension Father   . Heart disease Father   . Hyperlipidemia Father   . Asthma Sister   . Hyperlipidemia Brother   . Hypertension Brother   . Vision loss Brother   . Colon cancer Maternal Grandmother 89  . Liver cancer Maternal Grandmother   . Cervical cancer Maternal Aunt   . Stroke Paternal Uncle 78  . Melanoma Maternal Grandfather 45  . Colon polyps Maternal Grandfather   . AAA (abdominal aortic aneurysm) Paternal Grandmother   . Heart disease Paternal Grandfather   . Heart disease Paternal Uncle 79  . Colon cancer Other 7       MGM's mother  . Stomach cancer Other 23       MGM's mother  . Breast cancer Other        MGM's maternal aunt  . Cancer Other        MGM's maternal uncle with GI cancer  . Esophageal cancer Neg Hx    . Pancreatic cancer Neg Hx   . Rectal cancer Neg Hx     Allergies  Allergen Reactions  . Codeine Nausea Only    Needs antiemetic  . Adhesive [Tape]     Tapes, bandaids  . Amlodipine Other (See Comments)    EDEMA    Current Outpatient Medications on File Prior to Visit  Medication Sig Dispense Refill  . ACCU-CHEK SOFTCLIX LANCETS lancets Use as instructed 100 each 1  . azithromycin (ZITHROMAX Z-PAK) 250 MG tablet 2 tabs by mouth today, then one tab once daily for 4 more days 6 tablet 0  . Blood Glucose Monitoring Suppl (ACCU-CHEK GUIDE) w/Device KIT 1 each by Does not apply route daily. 1 kit 0  . budesonide-formoterol (SYMBICORT) 160-4.5 MCG/ACT inhaler Inhale 2 puffs into the lungs 2 (two) times daily. 1 Inhaler 3  . cetirizine (ZYRTEC) 10 MG tablet Take 10 mg by mouth daily.    . clotrimazole-betamethasone (LOTRISONE) cream Apply 1 application topically 2 (two) times daily. (Patient taking differently: Apply 1 application topically 2 (two) times daily as needed. ) 30 g 1  . colestipol (COLESTID) 1 g tablet Take 1 tablet (1 g total) by mouth 2 (two) times daily. 60 tablet 2  . dicyclomine (BENTYL) 20 MG tablet TAKE 1 TABLET BY MOUTH 4 TIMES DAILY BEFORE MEALS AND AT BEDTIME 120 tablet 5  . escitalopram (LEXAPRO) 10 MG tablet TAKE ONE TABLET BY MOUTH DAILY 90 tablet 1  . fluticasone (FLONASE) 50 MCG/ACT nasal spray Place 2 sprays into both nostrils daily as needed for allergies.     . furosemide (LASIX) 40 MG tablet Take 1 tablet (40 mg total) by mouth every morning. 30 tablet 4  . losartan (COZAAR) 100 MG tablet Take 1 tablet (100 mg total) by mouth daily. 30 tablet 4  . meloxicam (MOBIC) 15 MG tablet TAKE ONE TABLET BY MOUTH DAILY FOR 2 WEEKS THEN AS NEEDED WITH FOOD 30 tablet 5  . metoprolol succinate (TOPROL-XL) 100 MG 24 hr tablet Take 1 tablet (100 mg total) daily by mouth. Take with or immediately following a meal. 30 tablet 3  . mirtazapine (REMERON) 15 MG tablet TAKE ONE  TABLET BY MOUTH AT BEDTIME 30 tablet 5  . montelukast (SINGULAIR)  10 MG tablet TAKE ONE TABLET BY MOUTH AT BEDTIME 30 tablet 5  . Multiple Vitamins-Minerals (MULTIVITAMIN WITH MINERALS) tablet Take 1 tablet by mouth daily.    . norgestimate-ethinyl estradiol (ORTHO-CYCLEN,SPRINTEC,PREVIFEM) 0.25-35 MG-MCG tablet Take 1 tablet by mouth every evening. 1 Package 11  . omeprazole (PRILOSEC) 20 MG capsule TAKE 2 CAPSULES BY MOUTH EVERY MORNING BEFORE BREAKFAST AND 1 CAPSULE EVERY EVENING BEFORE SUPPER 90 capsule 5  . ondansetron (ZOFRAN ODT) 4 MG disintegrating tablet Take 1 tablet (4 mg total) every 8 (eight) hours as needed by mouth for nausea or vomiting. 20 tablet 0  . oxyCODONE (ROXICODONE) 5 MG immediate release tablet Take 1 tablet (5 mg total) every 6 (six) hours as needed by mouth for severe pain. 12 tablet 0  . potassium chloride (K-DUR,KLOR-CON) 10 MEQ tablet TAKE ONE TABLET BY MOUTH EVERY OTHER DAY 15 tablet 5  . predniSONE (DELTASONE) 10 MG tablet 4 tabs by mouth once daily for 2 days, then 3 tabs daily x 2 days, then 2 tabs daily x 2 days, then 1 tab daily x 2 days 20 tablet 0  . SUMAtriptan (IMITREX) 50 MG tablet Take 1 tablet (50 mg total) by mouth every 2 (two) hours as needed for migraine. May repeat in 2 hours if headache persists or recurs. 10 tablet 0  . traZODone (DESYREL) 50 MG tablet Take 1-2 tablets (50-100 mg total) by mouth at bedtime. (Patient taking differently: Take 50-100 mg by mouth at bedtime as needed. ) 60 tablet 5   Current Facility-Administered Medications on File Prior to Visit  Medication Dose Route Frequency Provider Last Rate Last Dose  . 0.9 %  sodium chloride infusion  500 mL Intravenous Continuous Ladene Artist, MD        BP (!) 149/88 (BP Location: Left Arm, Patient Position: Sitting, Cuff Size: Large)   Pulse 90   Temp 98.5 F (36.9 C) (Oral)   Resp 16   Ht 5' 6"  (1.676 m)   Wt 277 lb (125.6 kg)   LMP 08/15/2017 (LMP Unknown)   SpO2 96%   BMI  44.71 kg/m       Objective:   Physical Exam  Constitutional: She is oriented to person, place, and time. She appears well-developed and well-nourished.  HENT:  Head: Normocephalic and atraumatic.  Cardiovascular: Normal rate, regular rhythm and normal heart sounds.  No murmur heard. Pulmonary/Chest: Effort normal and breath sounds normal. No respiratory distress. She has no wheezes.  Musculoskeletal: She exhibits no edema.  Neurological: She is alert and oriented to person, place, and time.  Psychiatric: She has a normal mood and affect. Her behavior is normal. Judgment and thought content normal.          Assessment & Plan:  Diabetes type 2 uncontrolled.  Patient to increase metformin to milligrams once daily.  She is requesting referral to endocrinology.  Will arrange referral.  She will continue to send me her readings management.

## 2017-08-23 ENCOUNTER — Telehealth: Payer: Self-pay | Admitting: Medical

## 2017-08-23 ENCOUNTER — Encounter: Payer: Self-pay | Admitting: Family

## 2017-08-23 NOTE — Telephone Encounter (Signed)
Would you call and advise pt below. This is what I recommended by my chart.  I am currently covering for Melissa as she is out of the office.  Your sugars are still high but what is most concerning is your black stools and black vomitus you describe.  Are you going to be able to see Dr. Silvio Pate office as soon.  With your complaint I think it would be best for you to be evaluated tomorrow or Friday.  Definitely before the weekend.  Your stool could be checked to see if it is positive for blood.  Sometimes could be vomiting up some blood as well.  Would you describe scenario checking to make sure you are not anemic is very important.  If you have traumatic worsening of symptoms then recommend ED evaluation.  Lenna Sciara is usually off on Thursdays and she will be here on Friday.  But you could call here and schedule with me tomorrow.  If you schedule with me tomorrow to see if something is available early morning 8-9 am or 1-2 pm. This might minimize your wait time

## 2017-08-23 NOTE — Telephone Encounter (Signed)
Patient returned call, she says "I have received several messages from the office today and my symptoms are not any worse, I can come in tomorrow after work and if things get worse between now and my appointment, I will definitely go to the ER. I already have an appointment with GI in February for this problem." Appointment made for tomorrow with Mackie Pai, [facility] at 1745 per patient request.

## 2017-08-23 NOTE — Telephone Encounter (Signed)
Please advise in absence of PCP

## 2017-08-23 NOTE — Telephone Encounter (Signed)
Left message to return call. [place] for pec/triage to discuss with patient. Advise patient to go to ER if she is still having black stool and vomit. She does not need to wait until tomorrow to be seen if that is the case.

## 2017-08-24 ENCOUNTER — Ambulatory Visit (INDEPENDENT_AMBULATORY_CARE_PROVIDER_SITE_OTHER): Payer: Managed Care, Other (non HMO) | Admitting: Medical

## 2017-08-24 ENCOUNTER — Encounter: Payer: Self-pay | Admitting: Medical

## 2017-08-24 VITALS — BP 147/93 | HR 85 | Temp 98.8°F | Resp 16 | Ht 66.0 in | Wt 278.2 lb

## 2017-08-24 DIAGNOSIS — R1013 Epigastric pain: Secondary | ICD-10-CM

## 2017-08-24 DIAGNOSIS — K921 Melena: Secondary | ICD-10-CM | POA: Diagnosis not present

## 2017-08-24 DIAGNOSIS — R1011 Right upper quadrant pain: Secondary | ICD-10-CM

## 2017-08-24 NOTE — Telephone Encounter (Signed)
If she can get hear at 5:30 would be better in light of her complaints and be blocked 30 minutes. Or at least block the 5:30 so I can get to her quicker. As if my schedule fills I may run behind and don't want to pressed for time getting her to lab. With her complaints she will need cbc and maybe other tests.

## 2017-08-24 NOTE — Progress Notes (Signed)
Subjective:    Patient ID: Stephanie Miles, female    DOB: Apr 05, 1981, 37 y.o.   MRN: [id number]  HPI  Pt in for evaluation.   Pt states recent dark/black stools night before last. Also vomited up one dark looking vomit. Pt has ibs-d. Pt had some dark stools in the past. Pt states last year her pcp ordered ifob and studies came back negative.  Pt states colonoscopy was done in the past. Study was negative.  Then states found some polyps and mild diverticulitis per pt. But I reviewed 2015 colonoscopy and looked ok except hemorrhoids.  Prior EGD showed some barrets esophagus.  Pt still has gallbladder. No prior gallstones but hida scan per pt indicated overactive gallbadder.  Pt states her black stool and black vomitus has not been present over last 24 hours. Only had mild heart burn type pain associated.   Pt describes feeling good presently . Appointment with GI 12 th or 14t h.  Pt sugar today was 153 and 154 today. 2 days ago her sugars were higher. 08-15-2016. Pt last a1-c was 7.4. 3 weeks before was better. Pt will be seeing endocrinologist.  Some more pain ruq over past month.         Review of Systems  Constitutional: Negative for chills, fatigue and fever.  Respiratory: Negative for cough, chest tightness, shortness of breath and wheezing.   Cardiovascular: Negative for chest pain and palpitations.  Gastrointestinal: Positive for abdominal pain. Negative for abdominal distention, anal bleeding, blood in stool, constipation, diarrhea and nausea.       See HPI.  No current complaint except for some mild right upper quadrant tenderness on exam  Endocrine: Negative for polydipsia and polyphagia.  Genitourinary: Negative for dysuria.  Musculoskeletal: Negative for back pain and joint swelling.  Neurological: Negative for dizziness, syncope, speech difficulty, weakness and light-headedness.  Hematological: Negative for adenopathy. Does not bruise/bleed easily.    Psychiatric/Behavioral: Negative for behavioral problems and confusion.   Past Medical History:  Diagnosis Date  . Allergy   . Anxiety   . Aortic regurgitation due to bicuspid aortic valve   . Arthritis   . Borderline diabetes 10/20/2016  . Chronic kidney disease    kidney stones  . Depression   . Diabetes type 2, controlled ([place]) 10/20/2016  . Family history of breast cancer in female   . Family history of colon cancer   . Fatty liver 11/28/2016  . GERD (gastroesophageal reflux disease)   . Heart murmur   . Hypertension   . IBS (irritable bowel syndrome)   . Insomnia   . Migraine headache   . NASH (nonalcoholic steatohepatitis) 04/04/2017   Per biopsy 9/18  . OCD (obsessive compulsive disorder)   . Orbital cellulitis    at age 62 was hospitalized for 3 months (almost died)   . OSA (obstructive sleep apnea)   . S/P appy 05/2013  . Sleep apnea      Social History   Socioeconomic History  . Marital status: Single    Spouse name: Not on file  . Number of children: 0  . Years of education: Not on file  . Highest education level: Not on file  Social Needs  . Financial resource strain: Not on file  . Food insecurity - worry: Not on file  . Food insecurity - inability: Not on file  . Transportation needs - medical: Not on file  . Transportation needs - non-medical: Not on file  Occupational History  .  Occupation: Ship broker  . Occupation: Programmer, applications  Tobacco Use  . Smoking status: Former Smoker    Packs/day: 1.00    Years: 20.00    Pack years: 20.00    Types: Cigarettes    Last attempt to quit: 02/22/2013    Years since quitting: 4.5  . Smokeless tobacco: Never Used  Substance and Sexual Activity  . Alcohol use: Yes    Alcohol/week: 0.0 oz    Comment: social  . Drug use: No  . Sexual activity: Not on file  Other Topics Concern  . Not on file  Social History Narrative   Working at [employer] doing [employer] alone (has a Neurosurgeon)   Working on a degree at [employer]  and [employer], will plan to return fall 2018   Enjoys spending time with family.   Friends and family in [place]    Past Surgical History:  Procedure Laterality Date  . APPENDECTOMY    . COLONOSCOPY    . DENTAL SURGERY     wisdom teeth  . ESOPHAGOGASTRODUODENOSCOPY    . LAPAROSCOPIC APPENDECTOMY N/A 06/06/2013   Procedure: APPENDECTOMY LAPAROSCOPIC;  Surgeon: Imogene Burn. Georgette Dover, MD;  Location: [facility];  Service: General;  Laterality: N/A;  . LIVER BIOPSY      Family History  Problem Relation Age of Onset  . Asthma Mother   . Hypertension Mother   . Breast cancer Father 82  . Diabetes Father   . Hypertension Father   . Heart disease Father   . Hyperlipidemia Father   . Asthma Sister   . Hyperlipidemia Brother   . Hypertension Brother   . Vision loss Brother   . Colon cancer Maternal Grandmother 53  . Liver cancer Maternal Grandmother   . Cervical cancer Maternal Aunt   . Stroke Paternal Uncle 49  . Melanoma Maternal Grandfather 84  . Colon polyps Maternal Grandfather   . AAA (abdominal aortic aneurysm) Paternal Grandmother   . Heart disease Paternal Grandfather   . Heart disease Paternal Uncle 36  . Colon cancer Other 50       MGM's mother  . Stomach cancer Other 62       MGM's mother  . Breast cancer Other        MGM's maternal aunt  . Cancer Other        MGM's maternal uncle with GI cancer  . Esophageal cancer Neg Hx   . Pancreatic cancer Neg Hx   . Rectal cancer Neg Hx     Allergies  Allergen Reactions  . Codeine Nausea Only    Needs antiemetic  . Adhesive [Tape]     Tapes, bandaids  . Amlodipine Other (See Comments)    EDEMA  . Dust Mite Extract   . Mold Extract [Trichophyton]     Current Outpatient Medications on File Prior to Visit  Medication Sig Dispense Refill  . ACCU-CHEK FASTCLIX LANCETS MISC Check sugar twice daily 102 each 3  . ACCU-CHEK SOFTCLIX LANCETS lancets Use as instructed 100 each 1  . azithromycin (ZITHROMAX Z-PAK) 250 MG tablet 2 tabs  by mouth today, then one tab once daily for 4 more days 6 tablet 0  . Blood Glucose Monitoring Suppl (ACCU-CHEK GUIDE) w/Device KIT 1 each by Does not apply route daily. 1 kit 0  . budesonide-formoterol (SYMBICORT) 160-4.5 MCG/ACT inhaler Inhale 2 puffs into the lungs 2 (two) times daily. 1 Inhaler 3  . cetirizine (ZYRTEC) 10 MG tablet Take 10 mg by mouth  daily.    . clotrimazole-betamethasone (LOTRISONE) cream Apply 1 application topically 2 (two) times daily. (Patient taking differently: Apply 1 application topically 2 (two) times daily as needed. ) 30 g 1  . colestipol (COLESTID) 1 g tablet Take 1 tablet (1 g total) by mouth 2 (two) times daily. 60 tablet 2  . dicyclomine (BENTYL) 20 MG tablet TAKE 1 TABLET BY MOUTH 4 TIMES DAILY BEFORE MEALS AND AT BEDTIME 120 tablet 5  . escitalopram (LEXAPRO) 10 MG tablet TAKE ONE TABLET BY MOUTH DAILY 90 tablet 1  . fluticasone (FLONASE) 50 MCG/ACT nasal spray Place 2 sprays into both nostrils daily as needed for allergies.     . furosemide (LASIX) 40 MG tablet Take 1 tablet (40 mg total) by mouth every morning. 30 tablet 4  . glucose blood (ACCU-CHEK GUIDE) test strip Use as instructed to check blood sugar twice day. 100 each 1  . losartan (COZAAR) 100 MG tablet Take 1 tablet (100 mg total) by mouth daily. 30 tablet 4  . meloxicam (MOBIC) 15 MG tablet TAKE ONE TABLET BY MOUTH DAILY FOR 2 WEEKS THEN AS NEEDED WITH FOOD 30 tablet 5  . metFORMIN (GLUCOPHAGE XR) 500 MG 24 hr tablet Take 2 tablets (1,000 mg total) by mouth daily with breakfast. 60 tablet 2  . metoprolol succinate (TOPROL-XL) 100 MG 24 hr tablet Take 1 tablet (100 mg total) daily by mouth. Take with or immediately following a meal. 30 tablet 3  . mirtazapine (REMERON) 15 MG tablet TAKE ONE TABLET BY MOUTH AT BEDTIME 30 tablet 5  . montelukast (SINGULAIR) 10 MG tablet TAKE ONE TABLET BY MOUTH AT BEDTIME 30 tablet 5  . Multiple Vitamins-Minerals (MULTIVITAMIN WITH MINERALS) tablet Take 1 tablet by  mouth daily.    . norgestimate-ethinyl estradiol (ORTHO-CYCLEN,SPRINTEC,PREVIFEM) 0.25-35 MG-MCG tablet Take 1 tablet by mouth every evening. 1 Package 11  . omeprazole (PRILOSEC) 20 MG capsule TAKE 2 CAPSULES BY MOUTH EVERY MORNING BEFORE BREAKFAST AND 1 CAPSULE EVERY EVENING BEFORE SUPPER 90 capsule 5  . ondansetron (ZOFRAN ODT) 4 MG disintegrating tablet Take 1 tablet (4 mg total) every 8 (eight) hours as needed by mouth for nausea or vomiting. 20 tablet 0  . oxyCODONE (ROXICODONE) 5 MG immediate release tablet Take 1 tablet (5 mg total) every 6 (six) hours as needed by mouth for severe pain. 12 tablet 0  . potassium chloride (K-DUR,KLOR-CON) 10 MEQ tablet TAKE ONE TABLET BY MOUTH EVERY OTHER DAY 15 tablet 5  . predniSONE (DELTASONE) 10 MG tablet 4 tabs by mouth once daily for 2 days, then 3 tabs daily x 2 days, then 2 tabs daily x 2 days, then 1 tab daily x 2 days 20 tablet 0  . SUMAtriptan (IMITREX) 50 MG tablet Take 1 tablet (50 mg total) by mouth every 2 (two) hours as needed for migraine. May repeat in 2 hours if headache persists or recurs. 10 tablet 0  . traZODone (DESYREL) 50 MG tablet Take 1-2 tablets (50-100 mg total) by mouth at bedtime. (Patient taking differently: Take 50-100 mg by mouth at bedtime as needed. ) 60 tablet 5   Current Facility-Administered Medications on File Prior to Visit  Medication Dose Route Frequency Provider Last Rate Last Dose  . 0.9 %  sodium chloride infusion  500 mL Intravenous Continuous Ladene Artist, MD        BP (!) 147/93   Pulse 85   Temp 98.8 F (37.1 C) (Oral)   Resp 16   Ht  _0  (1.676 m)   Wt 278 lb 3.2 oz (126.2 kg)   LMP 08/15/2017 (LMP Unknown)   SpO2 97%   BMI 44.90 kg/m       Objective:   Physical Exam  General Mental Status- Alert. General Appearance- Not in acute distress.   Skin General: Color- Normal Color. Moisture- Normal Moisture.  Neck Carotid Arteries- Normal color. Moisture- Normal Moisture. No carotid  bruits. No JVD.  Chest and Lung Exam Auscultation: Breath Sounds:-Normal.  Cardiovascular Auscultation:Rythm- Regular. Murmurs & Other Heart Sounds:Auscultation of the heart reveals- No Murmurs.  Abdomen Inspection:-Inspeection Normal. Palpation/Percussion:Note:No mass. Palpation and Percussion of the abdomen reveal-faint right upper quadrant tendernes, Non Distended + BS, no rebound or guarding.  Neurologic Cranial Nerve exam:- CN III-XII intact(No nystagmus), symmetric smile. . Faint right upper quadrant finger to Nose:- Normal/Intact Strength:- 5/5 equal and symmetric strength both upper and lower extremities.      Assessment & Plan:  Recent epigastric pain with some black colored vomitus and recent black stools, we are ordering a CBC and CMP.  Also giving stool cards to check for blood.  If you have developed anemia/decrease in hemoglobin and hematocrit since last check then definitely would recommend turning in those cards.  Your symptoms have subsided over the last 24 hours but I do think it would be a good idea to try to get you scheduled for abdominal ultrasound since on exam you do have moderate right upper quadrant tenderness.  Continue to eat low sugar diet.  See endocrinologist when you are referred.  Keep checking your sugars.  Since your sugars are in the 150 range today not going to make any adjustments.  If your sugars do balance backup as before then would discuss with your PCP on making some incremental adjustment before you see the endocrinologist.  Your blood pressure is slightly high today.  This might be related to your moderate abdomen pain.  No changes to the pain management presently.  Would recommend you check your blood pressure daily to see if the trend is consistently over 140/90.  If so that we might need to adjust management.  Follow-up in 7 days or as needed.  Mackie Pai, PA-C

## 2017-08-24 NOTE — Telephone Encounter (Signed)
Appt w/ Percell Miller today.

## 2017-08-24 NOTE — Patient Instructions (Addendum)
Recent epigastric pain with some black colored vomitus and recent black stools, we are ordering a CBC and CMP.  Also giving stool cards to check for blood.  If you have developed anemia/decrease in hemoglobin and hematocrit since last check then definitely would recommend turning in those cards.  Your symptoms have subsided over the last 24 hours but I do think it would be a good idea to try to get you scheduled for abdominal ultrasound since on exam you do have moderate right upper quadrant tenderness.  Continue to eat low sugar diet.  See endocrinologist when you are referred.  Keep checking your sugars.  Since your sugars are in the 150 range today not going to make any adjustments.  If your sugars do balance backup as before then would discuss with your PCP on making some incremental adjustment before you see the endocrinologist.  Your blood pressure is slightly high today.  This might be related to your moderate abdomen pain.  No changes to the pain management presently.  Would recommend you check your blood pressure daily to see if the trend is consistently over 140/90.  If so that we might need to adjust management.  Follow-up in 7 days or as needed.

## 2017-08-25 LAB — CBC WITH DIFFERENTIAL/PLATELET
BASOS ABS: 0.2 10*3/uL — AB (ref 0.0–0.1)
Basophils Relative: 1.3 % (ref 0.0–3.0)
EOS PCT: 4.8 % (ref 0.0–5.0)
Eosinophils Absolute: 0.6 10*3/uL (ref 0.0–0.7)
HCT: 39.2 % (ref 36.0–46.0)
Hemoglobin: 13.2 g/dL (ref 12.0–15.0)
Lymphocytes Relative: 35.4 % (ref 12.0–46.0)
Lymphs Abs: 4.4 10*3/uL — ABNORMAL HIGH (ref 0.7–4.0)
MCHC: 33.7 g/dL (ref 30.0–36.0)
MCV: 87.8 fl (ref 78.0–100.0)
MONO ABS: 0.7 10*3/uL (ref 0.1–1.0)
MONOS PCT: 5.9 % (ref 3.0–12.0)
NEUTROS ABS: 6.5 10*3/uL (ref 1.4–7.7)
NEUTROS PCT: 52.6 % (ref 43.0–77.0)
Platelets: 238 10*3/uL (ref 150.0–400.0)
RBC: 4.47 Mil/uL (ref 3.87–5.11)
RDW: 13.8 % (ref 11.5–15.5)
WBC: 12.4 10*3/uL — ABNORMAL HIGH (ref 4.0–10.5)

## 2017-08-25 LAB — COMPREHENSIVE METABOLIC PANEL
ALT: 143 U/L — ABNORMAL HIGH (ref 0–35)
AST: 98 U/L — ABNORMAL HIGH (ref 0–37)
Albumin: 3.8 g/dL (ref 3.5–5.2)
Alkaline Phosphatase: 97 U/L (ref 39–117)
BUN: 11 mg/dL (ref 6–23)
CO2: 29 meq/L (ref 19–32)
Calcium: 8.9 mg/dL (ref 8.4–10.5)
Chloride: 103 mEq/L (ref 96–112)
Creatinine, Ser: 0.92 mg/dL (ref 0.40–1.20)
GFR: 72.34 mL/min (ref >60.00)
GLUCOSE: 140 mg/dL — AB (ref 70–99)
POTASSIUM: 3.6 meq/L (ref 3.5–5.1)
SODIUM: 138 meq/L (ref 135–145)
Total Bilirubin: 0.4 mg/dL (ref 0.2–1.2)
Total Protein: 7.2 g/dL (ref 6.0–8.3)

## 2017-08-26 ENCOUNTER — Ambulatory Visit (HOSPITAL_BASED_OUTPATIENT_CLINIC_OR_DEPARTMENT_OTHER): Payer: Managed Care, Other (non HMO)

## 2017-08-26 ENCOUNTER — Ambulatory Visit (HOSPITAL_BASED_OUTPATIENT_CLINIC_OR_DEPARTMENT_OTHER)
Admission: RE | Admit: 2017-08-26 | Discharge: 2017-08-26 | Disposition: A | Discharge status: Home / Self Care | Payer: Managed Care, Other (non HMO) | Source: Ambulatory Visit | Attending: Medical | Admitting: Medical

## 2017-08-26 DIAGNOSIS — N281 Cyst of kidney, acquired: Secondary | ICD-10-CM | POA: Insufficient documentation

## 2017-08-26 DIAGNOSIS — R1011 Right upper quadrant pain: Secondary | ICD-10-CM | POA: Insufficient documentation

## 2017-08-26 DIAGNOSIS — K760 Fatty (change of) liver, not elsewhere classified: Secondary | ICD-10-CM | POA: Insufficient documentation

## 2017-08-28 ENCOUNTER — Other Ambulatory Visit: Payer: Self-pay | Admitting: Family

## 2017-08-28 DIAGNOSIS — Z1231 Encounter for screening mammogram for malignant neoplasm of breast: Secondary | ICD-10-CM

## 2017-08-29 ENCOUNTER — Telehealth: Payer: Self-pay

## 2017-08-29 ENCOUNTER — Encounter: Payer: Self-pay | Admitting: Family

## 2017-08-29 DIAGNOSIS — E1165 Type 2 diabetes mellitus with hyperglycemia: Secondary | ICD-10-CM

## 2017-08-29 NOTE — Telephone Encounter (Signed)
-----   Message from Ladene Artist, MD sent at 08/29/2017 12:02 PM EST ----- Regarding: FW: [place] Results Were MRI/MRCP results reviewed with pt?  She should stop OCP if taking and repeat hepatic MRI in 6 months if not already scheduled.   ----- Message ----- From: Joanne Gavel, PA-C Sent: 06/12/2017  10:16 PM To: Ladene Artist, MD, Fatima Blank, MD Subject: [place] Results                                     Hello Dr. Fuller Plan,  Stephanie Miles was under my care in the emergency department at [facility] for severe right upper quadrant abdominal pain.  Her transaminases were more elevated than during her previous visits at 319 for her AST and 217 ALT.   I did a right upper quadrant ultrasound, which demonstrated a 12x6x8 mm hypoechoic lesion in the left liver.  Radiology recommended follow-up with an outpatient MRI.  I have discussed these results with [name], and she is planning to call you to schedule a follow-up appointment.   Thank you for your time, Mia McDonald PA-C

## 2017-08-29 NOTE — Telephone Encounter (Signed)
Patient contacted.  She has follow up with [facility] on 09/05/17.  She will see Gaspar Cola on 09/11/17 for the liver abnormalities and liver mass on MRI.  Patient advised to call if she has any more questions or concerns prior to OV on 09/05/17

## 2017-09-01 NOTE — Telephone Encounter (Signed)
I am so sorry! Rerouting referral to [facility]. Thanks

## 2017-09-01 NOTE — Telephone Encounter (Signed)
Kim-- pt prefers to see Dr Altheimer. Can you see referral and fax to him?  Thank you!

## 2017-09-02 ENCOUNTER — Other Ambulatory Visit: Payer: Self-pay | Admitting: Family

## 2017-09-02 DIAGNOSIS — I10 Essential (primary) hypertension: Secondary | ICD-10-CM

## 2017-09-05 ENCOUNTER — Encounter: Payer: Self-pay | Admitting: Gastroenterology

## 2017-09-05 ENCOUNTER — Ambulatory Visit (INDEPENDENT_AMBULATORY_CARE_PROVIDER_SITE_OTHER): Payer: Managed Care, Other (non HMO) | Admitting: Gastroenterology

## 2017-09-05 VITALS — BP 128/84 | HR 78 | Ht 66.0 in | Wt 277.0 lb

## 2017-09-05 DIAGNOSIS — K921 Melena: Secondary | ICD-10-CM

## 2017-09-05 NOTE — Progress Notes (Signed)
09/05/2017 Stephanie Miles [id number] 1981-01-22   HISTORY OF PRESENT ILLNESS:  This is a 37 year old female who is a patient of Dr. Lynne Leader.  She is known here for her history of diarrhea that is presumed to be due to IBS, GERD with Barrett's esophagus, as well as complaints of RUQ abdominal pain with elevated LFT's and liver lesion (has had extensive evaluation for these issues here and is actually going to be seen at [facility] later this week as well).  Anyway she is here today with complaints of vomiting black material x 2 and having black stools for a 24 hour period 2 weeks ago.  Says that the vomiting woke her up in the middle of the night and that was followed by 5 or 6 black tarry stools.  This resolved and has not recurred since that time.  She tells me that she's had the same symptoms in the past.  She had an EGD in 01/2017 with only Barrett's esophagus on EGD.  She is currently on OTC omeprazole 40 mg in the morning and 20 mg in the evening.  She does take mobic once daily for arthritis.  She saw her PCP last week and Hgb was normal.   Past Medical History:  Diagnosis Date  . Allergy   . Anxiety   . Aortic regurgitation due to bicuspid aortic valve   . Arthritis   . Borderline diabetes 10/20/2016  . Chronic kidney disease    kidney stones  . Depression   . Diabetes type 2, controlled ([place]) 10/20/2016  . Family history of breast cancer in female   . Family history of colon cancer   . Fatty liver 11/28/2016  . GERD (gastroesophageal reflux disease)   . Heart murmur   . Hypertension   . IBS (irritable bowel syndrome)   . Insomnia   . Migraine headache   . NASH (nonalcoholic steatohepatitis) 04/04/2017   Per biopsy 9/18  . OCD (obsessive compulsive disorder)   . Orbital cellulitis    at age 32 was hospitalized for 3 months (almost died)   . OSA (obstructive sleep apnea)   . S/P appy 05/2013  . Sleep apnea    Past Surgical History:  Procedure Laterality Date  . APPENDECTOMY      . COLONOSCOPY    . DENTAL SURGERY     wisdom teeth  . ESOPHAGOGASTRODUODENOSCOPY    . LAPAROSCOPIC APPENDECTOMY N/A 06/06/2013   Procedure: APPENDECTOMY LAPAROSCOPIC;  Surgeon: Imogene Burn. Georgette Dover, MD;  Location: [facility];  Service: General;  Laterality: N/A;  . LIVER BIOPSY      reports that she quit smoking about 4 years ago. Her smoking use included cigarettes. She has a 20.00 pack-year smoking history. she has never used smokeless tobacco. She reports that she drinks alcohol. She reports that she does not use drugs. family history includes AAA (abdominal aortic aneurysm) in her paternal grandmother; Asthma in her mother and sister; Breast cancer in her other; Breast cancer (age of onset: 86) in her father; Cancer in her other; Cervical cancer in her maternal aunt; Colon cancer (age of onset: 28) in her maternal grandmother; Colon cancer (age of onset: 61) in her other; Colon polyps in her maternal grandfather; Diabetes in her father; Heart disease in her father and paternal grandfather; Heart disease (age of onset: 75) in her paternal uncle; Hyperlipidemia in her brother and father; Hypertension in her brother, father, and mother; Liver cancer in her maternal grandmother; Melanoma (age  of onset: 52) in her maternal grandfather; Stomach cancer (age of onset: 14) in her other; Stroke (age of onset: 50) in her paternal uncle; Vision loss in her brother. Allergies  Allergen Reactions  . Codeine Nausea Only    Needs antiemetic  . Adhesive [Tape]     Tapes, bandaids  . Amlodipine Other (See Comments)    EDEMA  . Dust Mite Extract   . Mold Extract [Trichophyton]       Outpatient Encounter Medications as of 09/05/2017  Medication Sig  . ACCU-CHEK FASTCLIX LANCETS MISC Check sugar twice daily  . ACCU-CHEK SOFTCLIX LANCETS lancets Use as instructed  . Blood Glucose Monitoring Suppl (ACCU-CHEK GUIDE) w/Device KIT 1 each by Does not apply route daily.  . budesonide-formoterol (SYMBICORT) 160-4.5  MCG/ACT inhaler Inhale 2 puffs into the lungs 2 (two) times daily.  . cetirizine (ZYRTEC) 10 MG tablet Take 10 mg by mouth daily.  . colestipol (COLESTID) 1 g tablet Take 1 tablet (1 g total) by mouth 2 (two) times daily.  Marland Kitchen dicyclomine (BENTYL) 20 MG tablet TAKE 1 TABLET BY MOUTH 4 TIMES DAILY BEFORE MEALS AND AT BEDTIME  . escitalopram (LEXAPRO) 10 MG tablet TAKE ONE TABLET BY MOUTH DAILY  . fluticasone (FLONASE) 50 MCG/ACT nasal spray Place 2 sprays into both nostrils daily as needed for allergies.   . furosemide (LASIX) 40 MG tablet Take 1 tablet (40 mg total) by mouth every morning.  Marland Kitchen glucose blood (ACCU-CHEK GUIDE) test strip Use as instructed to check blood sugar twice day.  . losartan (COZAAR) 100 MG tablet TAKE ONE TABLET BY MOUTH DAILY  . meloxicam (MOBIC) 15 MG tablet TAKE ONE TABLET BY MOUTH DAILY FOR 2 WEEKS THEN AS NEEDED WITH FOOD  . metFORMIN (GLUCOPHAGE XR) 500 MG 24 hr tablet Take 2 tablets (1,000 mg total) by mouth daily with breakfast.  . metoprolol succinate (TOPROL-XL) 100 MG 24 hr tablet Take 1 tablet (100 mg total) daily by mouth. Take with or immediately following a meal.  . mirtazapine (REMERON) 15 MG tablet TAKE ONE TABLET BY MOUTH AT BEDTIME  . montelukast (SINGULAIR) 10 MG tablet TAKE ONE TABLET BY MOUTH AT BEDTIME  . Multiple Vitamins-Minerals (MULTIVITAMIN WITH MINERALS) tablet Take 1 tablet by mouth daily.  Marland Kitchen omeprazole (PRILOSEC) 20 MG capsule TAKE 2 CAPSULES BY MOUTH EVERY MORNING BEFORE BREAKFAST AND 1 CAPSULE EVERY EVENING BEFORE SUPPER  . ondansetron (ZOFRAN ODT) 4 MG disintegrating tablet Take 1 tablet (4 mg total) every 8 (eight) hours as needed by mouth for nausea or vomiting.  . potassium chloride (K-DUR,KLOR-CON) 10 MEQ tablet TAKE ONE TABLET BY MOUTH EVERY OTHER DAY  . SUMAtriptan (IMITREX) 50 MG tablet Take 1 tablet (50 mg total) by mouth every 2 (two) hours as needed for migraine. May repeat in 2 hours if headache persists or recurs.  . traZODone  (DESYREL) 50 MG tablet Take 1-2 tablets (50-100 mg total) by mouth at bedtime. (Patient taking differently: Take 50-100 mg by mouth at bedtime as needed. )  . [DISCONTINUED] azithromycin (ZITHROMAX Z-PAK) 250 MG tablet 2 tabs by mouth today, then one tab once daily for 4 more days  . [DISCONTINUED] clotrimazole-betamethasone (LOTRISONE) cream Apply 1 application topically 2 (two) times daily. (Patient taking differently: Apply 1 application topically 2 (two) times daily as needed. )  . [DISCONTINUED] norgestimate-ethinyl estradiol (ORTHO-CYCLEN,SPRINTEC,PREVIFEM) 0.25-35 MG-MCG tablet Take 1 tablet by mouth every evening. (Patient not taking: Reported on 09/05/2017)  . [DISCONTINUED] oxyCODONE (ROXICODONE) 5 MG immediate release tablet  Take 1 tablet (5 mg total) every 6 (six) hours as needed by mouth for severe pain. (Patient not taking: Reported on 09/05/2017)  . [DISCONTINUED] predniSONE (DELTASONE) 10 MG tablet 4 tabs by mouth once daily for 2 days, then 3 tabs daily x 2 days, then 2 tabs daily x 2 days, then 1 tab daily x 2 days (Patient not taking: Reported on 09/05/2017)   Facility-Administered Encounter Medications as of 09/05/2017  Medication  . 0.9 %  sodium chloride infusion     REVIEW OF SYSTEMS  : All other systems reviewed and negative except where noted in the History of Present Illness.   PHYSICAL EXAM: BP 128/84   Pulse 78   Ht 5' 6"  (1.676 m)   Wt 277 lb (125.6 kg)   LMP 08/15/2017 (LMP Unknown)   BMI 44.71 kg/m  General: Well developed white female in no acute distress Head: Normocephalic and atraumatic Eyes:  Sclerae anicteric, conjunctiva pink. Ears: Normal auditory acuity Lungs: Clear throughout to auscultation; no increased WOB  Heart: Regular rate and rhythm; no M/R/G. Abdomen: Soft, non-distended.  BS present.  Non-tender. Musculoskeletal: Symmetrical with no gross deformities  Skin: No lesions on visible extremities Extremities: No edema  Neurological: Alert  oriented x 4, grossly non-focal Psychological:  Alert and cooperative. Normal mood and affect  ASSESSMENT AND PLAN: *37 year old female who reports vomiting black material x 2 and having several episodes of black stools over 24 hour period 2 weeks ago.  This has resolved and has not recurred.  Had similar complaints in the past and had EGD 01/2017 with only Barrett's esophagus found.  Feels fine now.  CBC one week ago with normal Hgb.  I do not think that there is really anything to do at this point other than to possibly increase her PPI dose to 40 mg BID.  She would like to hold off with that for now.  She does take Mobic once daily and I encouraged her to try to discontinue that if possible.  She will call back for any recurrent symptoms.   CC:  Debbrah Alar, NP

## 2017-09-05 NOTE — Patient Instructions (Signed)
Call back if you have any more episodes of black stools.

## 2017-09-13 ENCOUNTER — Encounter: Payer: Self-pay | Admitting: Gastroenterology

## 2017-09-13 ENCOUNTER — Encounter: Payer: Self-pay | Admitting: Hematology & Oncology

## 2017-09-13 DIAGNOSIS — K921 Melena: Secondary | ICD-10-CM

## 2017-09-13 HISTORY — DX: Melena: K92.1

## 2017-09-13 NOTE — Progress Notes (Signed)
Reviewed and agree with management plan.  Malcolm T. Stark, MD FACG 

## 2017-09-29 ENCOUNTER — Other Ambulatory Visit: Payer: Managed Care, Other (non HMO)

## 2017-09-29 ENCOUNTER — Ambulatory Visit: Payer: Managed Care, Other (non HMO) | Admitting: Family

## 2017-10-02 ENCOUNTER — Ambulatory Visit: Payer: Managed Care, Other (non HMO) | Admitting: Family

## 2017-10-02 ENCOUNTER — Other Ambulatory Visit: Payer: Self-pay

## 2017-10-02 ENCOUNTER — Inpatient Hospital Stay: Payer: Managed Care, Other (non HMO) | Attending: Hematology & Oncology

## 2017-10-02 ENCOUNTER — Encounter: Payer: Self-pay | Admitting: Family

## 2017-10-02 ENCOUNTER — Telehealth: Payer: Self-pay | Admitting: Family

## 2017-10-02 ENCOUNTER — Inpatient Hospital Stay (HOSPITAL_BASED_OUTPATIENT_CLINIC_OR_DEPARTMENT_OTHER): Payer: Managed Care, Other (non HMO) | Admitting: Family

## 2017-10-02 ENCOUNTER — Ambulatory Visit (INDEPENDENT_AMBULATORY_CARE_PROVIDER_SITE_OTHER): Payer: Managed Care, Other (non HMO) | Admitting: Family

## 2017-10-02 VITALS — BP 136/68 | HR 79 | Temp 98.6°F | Resp 18 | Wt 278.0 lb

## 2017-10-02 DIAGNOSIS — D72828 Other elevated white blood cell count: Secondary | ICD-10-CM | POA: Insufficient documentation

## 2017-10-02 DIAGNOSIS — K909 Intestinal malabsorption, unspecified: Secondary | ICD-10-CM | POA: Diagnosis not present

## 2017-10-02 DIAGNOSIS — R5383 Other fatigue: Secondary | ICD-10-CM | POA: Insufficient documentation

## 2017-10-02 DIAGNOSIS — D510 Vitamin B12 deficiency anemia due to intrinsic factor deficiency: Secondary | ICD-10-CM

## 2017-10-02 DIAGNOSIS — E1165 Type 2 diabetes mellitus with hyperglycemia: Secondary | ICD-10-CM | POA: Insufficient documentation

## 2017-10-02 DIAGNOSIS — K7581 Nonalcoholic steatohepatitis (NASH): Secondary | ICD-10-CM | POA: Diagnosis not present

## 2017-10-02 DIAGNOSIS — L918 Other hypertrophic disorders of the skin: Secondary | ICD-10-CM | POA: Diagnosis not present

## 2017-10-02 DIAGNOSIS — R140 Abdominal distension (gaseous): Secondary | ICD-10-CM

## 2017-10-02 DIAGNOSIS — G629 Polyneuropathy, unspecified: Secondary | ICD-10-CM

## 2017-10-02 DIAGNOSIS — R0600 Dyspnea, unspecified: Secondary | ICD-10-CM | POA: Insufficient documentation

## 2017-10-02 DIAGNOSIS — K589 Irritable bowel syndrome without diarrhea: Secondary | ICD-10-CM | POA: Insufficient documentation

## 2017-10-02 DIAGNOSIS — D508 Other iron deficiency anemias: Secondary | ICD-10-CM

## 2017-10-02 DIAGNOSIS — R600 Localized edema: Secondary | ICD-10-CM

## 2017-10-02 DIAGNOSIS — K760 Fatty (change of) liver, not elsewhere classified: Secondary | ICD-10-CM

## 2017-10-02 DIAGNOSIS — R42 Dizziness and giddiness: Secondary | ICD-10-CM | POA: Diagnosis not present

## 2017-10-02 DIAGNOSIS — D500 Iron deficiency anemia secondary to blood loss (chronic): Secondary | ICD-10-CM

## 2017-10-02 DIAGNOSIS — Z7984 Long term (current) use of oral hypoglycemic drugs: Secondary | ICD-10-CM | POA: Diagnosis not present

## 2017-10-02 DIAGNOSIS — Z79899 Other long term (current) drug therapy: Secondary | ICD-10-CM

## 2017-10-02 DIAGNOSIS — N920 Excessive and frequent menstruation with regular cycle: Secondary | ICD-10-CM | POA: Insufficient documentation

## 2017-10-02 DIAGNOSIS — M1990 Unspecified osteoarthritis, unspecified site: Secondary | ICD-10-CM | POA: Insufficient documentation

## 2017-10-02 DIAGNOSIS — Q231 Congenital insufficiency of aortic valve: Secondary | ICD-10-CM

## 2017-10-02 DIAGNOSIS — R011 Cardiac murmur, unspecified: Secondary | ICD-10-CM

## 2017-10-02 DIAGNOSIS — E559 Vitamin D deficiency, unspecified: Secondary | ICD-10-CM

## 2017-10-02 LAB — CMP (CANCER CENTER ONLY)
ALT: 115 U/L — AB (ref 0–55)
AST: 65 U/L — AB (ref 5–34)
Albumin: 3.5 g/dL (ref 3.5–5.0)
Alkaline Phosphatase: 108 U/L (ref 40–150)
Anion gap: 8 (ref 3–11)
BUN: 14 mg/dL (ref 7–26)
CHLORIDE: 105 mmol/L (ref 98–109)
CO2: 26 mmol/L (ref 22–29)
CREATININE: 0.92 mg/dL (ref 0.60–1.10)
Calcium: 9.4 mg/dL (ref 8.4–10.4)
GFR, Est AFR Am: >60 mL/min (ref >60)
GFR, Estimated: >60 mL/min (ref >60)
Glucose, Bld: 139 mg/dL (ref 70–140)
Potassium: 4.1 mmol/L (ref 3.5–5.1)
SODIUM: 139 mmol/L (ref 136–145)
Total Bilirubin: 0.3 mg/dL (ref 0.2–1.2)
Total Protein: 7.3 g/dL (ref 6.4–8.3)

## 2017-10-02 LAB — CBC WITH DIFFERENTIAL (CANCER CENTER ONLY)
BASOS ABS: 0.1 10*3/uL (ref 0.0–0.1)
BASOS PCT: 1 %
EOS PCT: 5 %
Eosinophils Absolute: 0.6 10*3/uL — ABNORMAL HIGH (ref 0.0–0.5)
HCT: 39 % (ref 34.8–46.6)
Hemoglobin: 13.2 g/dL (ref 11.6–15.9)
Lymphocytes Relative: 40 %
Lymphs Abs: 4.9 10*3/uL — ABNORMAL HIGH (ref 0.9–3.3)
MCH: 30.3 pg (ref 26.0–34.0)
MCHC: 33.8 g/dL (ref 32.0–36.0)
MCV: 89.7 fL (ref 81.0–101.0)
Monocytes Absolute: 0.9 10*3/uL (ref 0.1–0.9)
Monocytes Relative: 7 %
Neutro Abs: 5.7 10*3/uL (ref 1.5–6.5)
Neutrophils Relative %: 47 %
PLATELETS: 235 10*3/uL (ref 145–400)
RBC: 4.35 MIL/uL (ref 3.70–5.32)
RDW: 13.4 % (ref 11.1–15.7)
WBC: 12.2 10*3/uL — AB (ref 3.9–10.0)

## 2017-10-02 NOTE — Patient Instructions (Signed)
You should be contacted about your referral to bariatrics and cardiology.

## 2017-10-02 NOTE — Telephone Encounter (Signed)
See my chart message

## 2017-10-02 NOTE — Progress Notes (Signed)
Subjective:    Patient ID: Stephanie Miles, female    DOB: Apr 19, 1981, 37 y.o.   MRN: [id number]  HPI  Stephanie Miles is a 37 yr old female who presents today for follow up.  DM2- running in the 160 range, got up to 348.  Has apt scheduled with endocrinology next Friday.   BP Readings from Last 3 Encounters:  10/02/17 136/68  09/05/17 128/84  08/24/17 (!) 147/93   Abnormal LFT- has biops-proven NAFLD.  She does have evidence of scarring.  Saw Dr. Monica Miles and was told that "my liver is probably not going to last my whole life but we are going to try to prolong it as long as possible."   Skin tags- has several skin tags she would like removed.   Morbid obesity- walks to bus 1 mile a day and walks at lunch with coworkers.  Reports that she is "mainly eating meat and vegetables." frustrated with her lack of weight loss.  Wt Readings from Last 3 Encounters:  10/02/17 278 lb 12.8 oz (126.5 kg)  10/02/17 278 lb (126.1 kg)  09/05/17 277 lb (125.6 kg)    Review of Systems See HPI  Past Medical History:  Diagnosis Date  . Allergy   . Anxiety   . Aortic regurgitation due to bicuspid aortic valve   . Arthritis   . Borderline diabetes 10/20/2016  . Chronic kidney disease    kidney stones  . Depression   . Diabetes type 2, controlled ([place]) 10/20/2016  . Family history of breast cancer in female   . Family history of colon cancer   . Fatty liver 11/28/2016  . GERD (gastroesophageal reflux disease)   . Heart murmur   . Hypertension   . IBS (irritable bowel syndrome)   . Insomnia   . Migraine headache   . NASH (nonalcoholic steatohepatitis) 04/04/2017   Per biopsy 9/18  . OCD (obsessive compulsive disorder)   . Orbital cellulitis    at age 89 was hospitalized for 3 months (almost died)   . OSA (obstructive sleep apnea)   . S/P appy 05/2013  . Sleep apnea      Social History   Socioeconomic History  . Marital status: Single    Spouse name: Not on file  . Number of children: 0    . Years of education: Not on file  . Highest education level: Not on file  Social Needs  . Financial resource strain: Not on file  . Food insecurity - worry: Not on file  . Food insecurity - inability: Not on file  . Transportation needs - medical: Not on file  . Transportation needs - non-medical: Not on file  Occupational History  . Occupation: Ship broker  . Occupation: Programmer, applications  Tobacco Use  . Smoking status: Former Smoker    Packs/day: 1.00    Years: 20.00    Pack years: 20.00    Types: Cigarettes    Last attempt to quit: 02/22/2013    Years since quitting: 4.6  . Smokeless tobacco: Never Used  Substance and Sexual Activity  . Alcohol use: Yes    Alcohol/week: 0.0 oz    Comment: social  . Drug use: No  . Sexual activity: Not on file  Other Topics Concern  . Not on file  Social History Narrative   Working at [employer] doing [employer] alone (has a Neurosurgeon)   Working on a degree at [employer], will plan to return fall  2018   Enjoys spending time with family.   Friends and family in [place]    Past Surgical History:  Procedure Laterality Date  . APPENDECTOMY    . COLONOSCOPY    . DENTAL SURGERY     wisdom teeth  . ESOPHAGOGASTRODUODENOSCOPY    . LAPAROSCOPIC APPENDECTOMY N/A 06/06/2013   Procedure: APPENDECTOMY LAPAROSCOPIC;  Surgeon: Imogene Burn. Georgette Dover, MD;  Location: [facility];  Service: General;  Laterality: N/A;  . LIVER BIOPSY      Family History  Problem Relation Age of Onset  . Asthma Mother   . Hypertension Mother   . Breast cancer Father 78  . Diabetes Father   . Hypertension Father   . Heart disease Father   . Hyperlipidemia Father   . Asthma Sister   . Hyperlipidemia Brother   . Hypertension Brother   . Vision loss Brother   . Colon cancer Maternal Grandmother 16  . Liver cancer Maternal Grandmother   . Cervical cancer Maternal Aunt   . Stroke Paternal Uncle 16  . Melanoma Maternal Grandfather 37  . Colon polyps Maternal Grandfather    . AAA (abdominal aortic aneurysm) Paternal Grandmother   . Heart disease Paternal Grandfather   . Heart disease Paternal Uncle 100  . Colon cancer Other 28       MGM's mother  . Stomach cancer Other 1       MGM's mother  . Breast cancer Other        MGM's maternal aunt  . Cancer Other        MGM's maternal uncle with GI cancer  . Esophageal cancer Neg Hx   . Pancreatic cancer Neg Hx   . Rectal cancer Neg Hx     Allergies  Allergen Reactions  . Codeine Nausea Only    Needs antiemetic Needs antiemetic  . Amlodipine Other (See Comments)    EDEMA EDEMA  . Dust Mite Extract   . Mold Extract [Trichophyton]   . Tape Rash    Tapes, bandaids Tapes, bandaids    Current Outpatient Medications on File Prior to Visit  Medication Sig Dispense Refill  . ACCU-CHEK FASTCLIX LANCETS MISC Check sugar twice daily 102 each 3  . ACCU-CHEK SOFTCLIX LANCETS lancets Use as instructed 100 each 1  . Blood Glucose Monitoring Suppl (ACCU-CHEK GUIDE) w/Device KIT 1 each by Does not apply route daily. 1 kit 0  . budesonide-formoterol (SYMBICORT) 160-4.5 MCG/ACT inhaler Inhale 2 puffs into the lungs 2 (two) times daily. 1 Inhaler 3  . cetirizine (ZYRTEC) 10 MG tablet Take 10 mg by mouth daily.    Marland Kitchen dicyclomine (BENTYL) 20 MG tablet TAKE 1 TABLET BY MOUTH 4 TIMES DAILY BEFORE MEALS AND AT BEDTIME 120 tablet 5  . escitalopram (LEXAPRO) 10 MG tablet TAKE ONE TABLET BY MOUTH DAILY 90 tablet 1  . fluticasone (FLONASE) 50 MCG/ACT nasal spray Place 2 sprays into both nostrils daily as needed for allergies.     . furosemide (LASIX) 40 MG tablet Take 1 tablet (40 mg total) by mouth every morning. 30 tablet 4  . glucose blood (ACCU-CHEK GUIDE) test strip Use as instructed to check blood sugar twice day. 100 each 1  . losartan (COZAAR) 100 MG tablet TAKE ONE TABLET BY MOUTH DAILY 30 tablet 5  . meloxicam (MOBIC) 15 MG tablet TAKE ONE TABLET BY MOUTH DAILY FOR 2 WEEKS THEN AS NEEDED WITH FOOD 30 tablet 5  .  metFORMIN (GLUCOPHAGE XR) 500 MG 24 hr tablet Take  2 tablets (1,000 mg total) by mouth daily with breakfast. 60 tablet 2  . metoprolol succinate (TOPROL-XL) 100 MG 24 hr tablet Take 1 tablet (100 mg total) daily by mouth. Take with or immediately following a meal. 30 tablet 3  . mirtazapine (REMERON) 15 MG tablet TAKE ONE TABLET BY MOUTH AT BEDTIME 30 tablet 5  . montelukast (SINGULAIR) 10 MG tablet TAKE ONE TABLET BY MOUTH AT BEDTIME 30 tablet 5  . Multiple Vitamins-Minerals (MULTIVITAMIN WITH MINERALS) tablet Take 1 tablet by mouth daily.    Marland Kitchen omeprazole (PRILOSEC) 20 MG capsule TAKE 2 CAPSULES BY MOUTH EVERY MORNING BEFORE BREAKFAST AND 1 CAPSULE EVERY EVENING BEFORE SUPPER 90 capsule 5  . ondansetron (ZOFRAN ODT) 4 MG disintegrating tablet Take 1 tablet (4 mg total) every 8 (eight) hours as needed by mouth for nausea or vomiting. 20 tablet 0  . potassium chloride (K-DUR,KLOR-CON) 10 MEQ tablet TAKE ONE TABLET BY MOUTH EVERY OTHER DAY 15 tablet 5  . SUMAtriptan (IMITREX) 50 MG tablet Take 1 tablet (50 mg total) by mouth every 2 (two) hours as needed for migraine. May repeat in 2 hours if headache persists or recurs. 10 tablet 0  . traZODone (DESYREL) 50 MG tablet Take 1-2 tablets (50-100 mg total) by mouth at bedtime. (Patient taking differently: Take 50-100 mg by mouth at bedtime as needed. ) 60 tablet 5  . colestipol (COLESTID) 1 g tablet Take 1 tablet (1 g total) by mouth 2 (two) times daily. (Patient not taking: Reported on 10/02/2017) 60 tablet 2   Current Facility-Administered Medications on File Prior to Visit  Medication Dose Route Frequency Provider Last Rate Last Dose  . 0.9 %  sodium chloride infusion  500 mL Intravenous Continuous Ladene Artist, MD        Pulse 73   Temp 98.4 F (36.9 C) (Oral)   Resp 18   Ht 5' 6" (1.676 m)   Wt 278 lb 12.8 oz (126.5 kg)   LMP 09/23/2017   BMI 45.00 kg/m       Objective:   Physical Exam  Constitutional: She is oriented to person,  place, and time. She appears well-developed and well-nourished.  HENT:  Head: Normocephalic and atraumatic.  Cardiovascular: Normal rate, regular rhythm and normal heart sounds.  No murmur heard. Pulmonary/Chest: Effort normal and breath sounds normal. No respiratory distress. She has no wheezes.  Musculoskeletal: She exhibits no edema.  Neurological: She is alert and oriented to person, place, and time.  Psychiatric: She has a normal mood and affect. Her behavior is normal. Judgment and thought content normal.          Assessment & Plan:  Morbid obesity- We discussed importance of weight loss due to DM and especially her liver disease.  I suggested bariatric referral which she is agreeable to.  Referral placed.  Skin tags- advised pt to schedule an appointment for removal here and I can take them off for her.   DM2- uncontrolled. Defer med changes to endocrinology.  NAFLD- being followed by [facility] Liver transplant.  Management per hepatology.  No she does not she actually used to work with she for 1 week,

## 2017-10-02 NOTE — Progress Notes (Signed)
Hematology and Oncology Follow Up Visit  Stephanie Miles [id number] Apr 23, 1981 37 y.o. 10/02/2017   Principle Diagnosis:  Leukocytosis - reactive Arthritis Iron deficiency anemia   Current Therapy:   Observation IV iron as indicated    Interim History:  Stephanie Miles is here today for follow-up. She is feeling fatigued, SOB with exertion and has occasional dizziness when she stands to quickly.  She is now seeing a liver specialist and was diagnosed with NASH. She states that they told her she would eventually need a liver transplant.  She was also diagnosed with diabetes and is currently on Metformin. Her blood sugars are not well controlled and she has an appointment with an endocrinologist in early April.  She was taken off her birth control due to the NASH. Her cycles are regular and very heavy.  She has occasional palpitations with a 2/6 systolic murmur due to bicuspid valve regurgitation.  She has chronic sinus congestion and drainage.  No fever, chills, n/v, cough, rash, chest pain, abdominal pain or changes in bowel or bladder habits.  IBS with diarrhea is unchanged. She has started taking a fiber gummy which has seemed to help some with her diarrhea. She has bloating of the abdomen.  She has intermittent numbness and tingling in her feet and occasionally in her fingertips. She has puffiness in her feet and ankles that waxes and wanes. She takes lasix daily to help reduce this.  Her appetite comes and goes. She is trying to stay well hydrated. Her weight is stable.   ECOG Performance Status: 1 - Symptomatic but completely ambulatory  Medications:  Allergies as of 10/02/2017      Reactions   Codeine Nausea Only   Needs antiemetic Needs antiemetic   Amlodipine Other (See Comments)   EDEMA EDEMA   Dust Mite Extract    Mold Extract [trichophyton]    Tape Rash   Tapes, bandaids Tapes, bandaids      Medication List        Accurate as of 10/02/17  4:03 PM. Always use  your most recent med list.          ACCU-CHEK GUIDE w/Device Kit 1 each by Does not apply route daily.   ACCU-CHEK SOFTCLIX LANCETS lancets Use as instructed   ACCU-CHEK FASTCLIX LANCETS Misc Check sugar twice daily   budesonide-formoterol 160-4.5 MCG/ACT inhaler Commonly known as:  SYMBICORT Inhale 2 puffs into the lungs 2 (two) times daily.   cetirizine 10 MG tablet Commonly known as:  ZYRTEC Take 10 mg by mouth daily.   colestipol 1 g tablet Commonly known as:  COLESTID Take 1 tablet (1 g total) by mouth 2 (two) times daily.   dicyclomine 20 MG tablet Commonly known as:  BENTYL TAKE 1 TABLET BY MOUTH 4 TIMES DAILY BEFORE MEALS AND AT BEDTIME   escitalopram 10 MG tablet Commonly known as:  LEXAPRO TAKE ONE TABLET BY MOUTH DAILY   fluticasone 50 MCG/ACT nasal spray Commonly known as:  FLONASE Place 2 sprays into both nostrils daily as needed for allergies.   furosemide 40 MG tablet Commonly known as:  LASIX Take 1 tablet (40 mg total) by mouth every morning.   glucose blood test strip Commonly known as:  ACCU-CHEK GUIDE Use as instructed to check blood sugar twice day.   losartan 100 MG tablet Commonly known as:  COZAAR TAKE ONE TABLET BY MOUTH DAILY   meloxicam 15 MG tablet Commonly known as:  MOBIC TAKE ONE TABLET BY MOUTH DAILY  FOR 2 WEEKS THEN AS NEEDED WITH FOOD   metFORMIN 500 MG 24 hr tablet Commonly known as:  GLUCOPHAGE XR Take 2 tablets (1,000 mg total) by mouth daily with breakfast.   metoprolol succinate 100 MG 24 hr tablet Commonly known as:  TOPROL-XL Take 1 tablet (100 mg total) daily by mouth. Take with or immediately following a meal.   mirtazapine 15 MG tablet Commonly known as:  REMERON TAKE ONE TABLET BY MOUTH AT BEDTIME   montelukast 10 MG tablet Commonly known as:  SINGULAIR TAKE ONE TABLET BY MOUTH AT BEDTIME   multivitamin with minerals tablet Take 1 tablet by mouth daily.   omeprazole 20 MG capsule Commonly known as:   PRILOSEC TAKE 2 CAPSULES BY MOUTH EVERY MORNING BEFORE BREAKFAST AND 1 CAPSULE EVERY EVENING BEFORE SUPPER   ondansetron 4 MG disintegrating tablet Commonly known as:  ZOFRAN ODT Take 1 tablet (4 mg total) every 8 (eight) hours as needed by mouth for nausea or vomiting.   potassium chloride 10 MEQ tablet Commonly known as:  K-DUR,KLOR-CON TAKE ONE TABLET BY MOUTH EVERY OTHER DAY   SUMAtriptan 50 MG tablet Commonly known as:  IMITREX Take 1 tablet (50 mg total) by mouth every 2 (two) hours as needed for migraine. May repeat in 2 hours if headache persists or recurs.   traZODone 50 MG tablet Commonly known as:  DESYREL Take 1-2 tablets (50-100 mg total) by mouth at bedtime.       Allergies:  Allergies  Allergen Reactions  . Codeine Nausea Only    Needs antiemetic Needs antiemetic  . Amlodipine Other (See Comments)    EDEMA EDEMA  . Dust Mite Extract   . Mold Extract [Trichophyton]   . Tape Rash    Tapes, bandaids Tapes, bandaids    Past Medical History, Surgical history, Social history, and Family History were reviewed and updated.  Review of Systems: All other 10 point review of systems is negative.   Physical Exam:  weight is 278 lb (126.1 kg). Her oral temperature is 98.6 F (37 C). Her blood pressure is 136/68 and her pulse is 79. Her respiration is 18 and oxygen saturation is 97%.   Wt Readings from Last 3 Encounters:  10/02/17 278 lb 12.8 oz (126.5 kg)  10/02/17 278 lb (126.1 kg)  09/05/17 277 lb (125.6 kg)    Ocular: Sclerae unicteric, pupils equal, round and reactive to light Ear-nose-throat: Oropharynx clear, dentition fair Lymphatic: No cervical, supraclavicular or axillary adenopathy Lungs no rales or rhonchi, good excursion bilaterally Heart regular rate and rhythm, no murmur appreciated Abd soft, nontender, positive bowel sounds, no liver or spleen tip palpated on exam, no fluid wave  MSK no focal spinal tenderness, no joint edema Neuro:  non-focal, well-oriented, appropriate affect Breasts: Deferred    Lab Results  Component Value Date   WBC 12.2 (H) 10/02/2017   HGB 13.2 08/24/2017   HCT 39.0 10/02/2017   MCV 89.7 10/02/2017   PLT 235 10/02/2017   Lab Results  Component Value Date   FERRITIN 384 (H) 06/09/2017   IRON 62 06/09/2017   TIBC 333 06/09/2017   UIBC 271 06/09/2017   IRONPCTSAT 18 (L) 06/09/2017   Lab Results  Component Value Date   RBC 4.35 10/02/2017   No results found for: Nils Pyle [facility] Lab Results  Component Value Date   IGA 195 01/11/2017   Lab Results  Component Value Date   TOTALPROTELP 6.9 12/28/2016   ALBUMINELP 3.6 (L) 12/28/2016  A1GS 0.4 (H) 12/28/2016   A2GS 0.9 12/28/2016   BETS 0.6 12/28/2016   BETA2SER 0.4 12/28/2016   GAMS 1.0 12/28/2016   SPEI SEE NOTE 12/28/2016     Chemistry      Component Value Date/Time   NA 138 08/24/2017 1824   NA 142 07/07/2017 1202   K 3.6 08/24/2017 1824   K 3.9 07/07/2017 1202   CL 103 08/24/2017 1824   CL 104 07/07/2017 1202   CO2 29 08/24/2017 1824   CO2 28 07/07/2017 1202   BUN 11 08/24/2017 1824   BUN 11 07/07/2017 1202   CREATININE 0.92 08/24/2017 1824   CREATININE 1.1 07/07/2017 1202      Component Value Date/Time   CALCIUM 8.9 08/24/2017 1824   CALCIUM 9.2 07/07/2017 1202   ALKPHOS 97 08/24/2017 1824   ALKPHOS 92 (H) 07/07/2017 1202   AST 98 (H) 08/24/2017 1824   AST 120 (H) 07/07/2017 1202   ALT 143 (H) 08/24/2017 1824   ALT 143 (H) 07/07/2017 1202   BILITOT 0.4 08/24/2017 1824   BILITOT 0.60 07/07/2017 1202      Impression and Plan: Ms. Devincenzi is a very pleasant 37 yo caucasian female with reactive leukocytosis with arthritis and iron deficiency anemia secondary to malabsorption and heavy cycles. She has also recently been diagnosed with NASH as well as diabetes.  We will see what her iron studies show and bring her back in for infusion if needed.  WBC count is 12.2, Hgb 13.2 and platelets  235. Vit D is pending.  We will go ahead and plan to see her back in 6 weeks for follow-up.  She will contact our office with any questions or concerns. We can certainly see her sooner if need be.   Laverna Peace, NP 3/11/20194:03 PM

## 2017-10-03 ENCOUNTER — Other Ambulatory Visit: Payer: Self-pay | Admitting: Family

## 2017-10-03 ENCOUNTER — Telehealth: Payer: Self-pay | Admitting: Family

## 2017-10-03 ENCOUNTER — Other Ambulatory Visit: Payer: Self-pay | Admitting: *Deleted

## 2017-10-03 DIAGNOSIS — I10 Essential (primary) hypertension: Secondary | ICD-10-CM

## 2017-10-03 DIAGNOSIS — E559 Vitamin D deficiency, unspecified: Secondary | ICD-10-CM

## 2017-10-03 LAB — IRON AND TIBC
Iron: 77 ug/dL (ref 41–142)
Saturation Ratios: 31 % (ref 21–57)
TIBC: 253 ug/dL (ref 236–444)
UIBC: 175 ug/dL

## 2017-10-03 LAB — FERRITIN: Ferritin: 235 ng/mL (ref 9–269)

## 2017-10-03 NOTE — Telephone Encounter (Signed)
See note

## 2017-10-04 ENCOUNTER — Encounter: Payer: Self-pay | Admitting: Family

## 2017-10-04 ENCOUNTER — Telehealth: Payer: Self-pay | Admitting: *Deleted

## 2017-10-04 LAB — VITAMIN D 25 HYDROXY (VIT D DEFICIENCY, FRACTURES): Vit D, 25-Hydroxy: 14.7 ng/mL — ABNORMAL LOW (ref 30.0–100.0)

## 2017-10-04 NOTE — Telephone Encounter (Addendum)
Patient is aware of results. She will start vitamin D 2000 units daily.   ----- Message from Volanda Napoleon, MD sent at 10/04/2017  3:09 PM EDT ----- Call - Vit d is low!!!  Is she on any vit d??  pete

## 2017-10-06 ENCOUNTER — Other Ambulatory Visit: Payer: Managed Care, Other (non HMO)

## 2017-10-06 ENCOUNTER — Ambulatory Visit: Payer: Managed Care, Other (non HMO) | Admitting: Hematology & Oncology

## 2017-10-12 ENCOUNTER — Other Ambulatory Visit: Payer: Self-pay | Admitting: Family

## 2017-10-12 ENCOUNTER — Encounter: Payer: Self-pay | Admitting: Family

## 2017-10-12 DIAGNOSIS — K769 Liver disease, unspecified: Secondary | ICD-10-CM

## 2017-10-15 ENCOUNTER — Encounter: Payer: Self-pay | Admitting: Family

## 2017-10-16 ENCOUNTER — Encounter: Payer: Self-pay | Admitting: Family

## 2017-10-17 ENCOUNTER — Ambulatory Visit: Payer: Managed Care, Other (non HMO) | Admitting: Medical

## 2017-10-18 ENCOUNTER — Encounter: Payer: Self-pay | Admitting: Family

## 2017-10-19 ENCOUNTER — Other Ambulatory Visit: Payer: Self-pay | Admitting: Family

## 2017-10-19 MED ORDER — ESCITALOPRAM OXALATE 20 MG PO TABS: 20.0000 mg | ORAL_TABLET | Freq: Every day | ORAL | 1 refills | Status: DC

## 2017-10-20 ENCOUNTER — Other Ambulatory Visit (HOSPITAL_COMMUNITY): Payer: Managed Care, Other (non HMO)

## 2017-11-01 DIAGNOSIS — E559 Vitamin D deficiency, unspecified: Secondary | ICD-10-CM | POA: Insufficient documentation

## 2017-11-01 HISTORY — DX: Vitamin D deficiency, unspecified: E55.9

## 2017-11-02 ENCOUNTER — Other Ambulatory Visit: Payer: Self-pay | Admitting: Family

## 2017-11-02 ENCOUNTER — Ambulatory Visit (INDEPENDENT_AMBULATORY_CARE_PROVIDER_SITE_OTHER): Payer: Managed Care, Other (non HMO) | Admitting: Cardiology

## 2017-11-02 ENCOUNTER — Encounter: Payer: Self-pay | Admitting: Cardiology

## 2017-11-02 VITALS — BP 132/80 | HR 68 | Ht 66.0 in | Wt 276.0 lb

## 2017-11-02 DIAGNOSIS — K7581 Nonalcoholic steatohepatitis (NASH): Secondary | ICD-10-CM | POA: Diagnosis not present

## 2017-11-02 DIAGNOSIS — E1121 Type 2 diabetes mellitus with diabetic nephropathy: Secondary | ICD-10-CM | POA: Diagnosis not present

## 2017-11-02 DIAGNOSIS — Q231 Congenital insufficiency of aortic valve: Secondary | ICD-10-CM

## 2017-11-02 DIAGNOSIS — I10 Essential (primary) hypertension: Secondary | ICD-10-CM

## 2017-11-02 DIAGNOSIS — R945 Abnormal results of liver function studies: Secondary | ICD-10-CM

## 2017-11-02 DIAGNOSIS — G4730 Sleep apnea, unspecified: Secondary | ICD-10-CM

## 2017-11-02 DIAGNOSIS — R7989 Other specified abnormal findings of blood chemistry: Secondary | ICD-10-CM

## 2017-11-02 NOTE — Progress Notes (Signed)
Cardiology Office Note:    Date:  11/02/2017   ID:  Stephanie Miles, DOB Apr 11, 1981, MRN [id number]  PCP:  Debbrah Alar, NP  Cardiologist:  Jenean Lindau, MD   Referring MD: Debbrah Alar, NP    ASSESSMENT:    1. Aortic regurgitation due to bicuspid aortic valve   2. Essential hypertension   3. Sleep apnea, unspecified type   4. Controlled type 2 diabetes mellitus with diabetic nephropathy, without long-term current use of insulin (HCC)   5. Elevated LFTs   6. NASH (nonalcoholic steatohepatitis)    PLAN:    In order of problems listed above:  1. Primary prevention stressed with the patient.  Importance of compliance with diet and medications stressed and she vocalized understanding.  We will assess her bicuspid aortic valve with an echocardiogram.  This will also help assess the regurgitation.  Also patients with bicuspid aortic valve have significant aortopathy.  Interestingly she is on losartan for essential hypertension which might be of some help in this condition.  We will do a CT scan of the chest with contrast to rule out aortic aneurysm or any aortopathy related issues.Patient will be seen in follow-up appointment in 6 months or earlier if the patient has any concerns 2. She is under the care of liver specialists at the [facility] evaluation for possible hepatocellular carcinoma and her other liver issues.   Medication Adjustments/Labs and Tests Ordered: Current medicines are reviewed at length with the patient today.  Concerns regarding medicines are outlined above.  No orders of the defined types were placed in this encounter.  No orders of the defined types were placed in this encounter.    History of Present Illness:    Stephanie Miles is a 37 y.o. female who is being seen today for the evaluation of bicuspid aortic valve with regurgitation at the request of Debbrah Alar, NP.  The patient has been diagnosed with a bicuspid  aortic valve in the past and is here for follow-up.  She tells me that she has been told she has had significant regurgitation.  She also mentions to me that she has NASH.  The patient takes care of activities of daily living.  She has no chest pain orthopnea or PND.  She says he has suspected hepatocellular cancer and is being evaluated for it.  At the time of my evaluation, the patient is alert awake oriented and in no distress.  Her mother accompanies her at this visit.  Past Medical History:  Diagnosis Date  . Allergy   . Anxiety   . Aortic regurgitation due to bicuspid aortic valve   . Arthritis   . Borderline diabetes 10/20/2016  . Chronic kidney disease    kidney stones  . Depression   . Diabetes type 2, controlled ([place]) 10/20/2016  . Family history of breast cancer in female   . Family history of colon cancer   . Fatty liver 11/28/2016  . GERD (gastroesophageal reflux disease)   . Heart murmur   . Hypertension   . IBS (irritable bowel syndrome)   . Insomnia   . Migraine headache   . NASH (nonalcoholic steatohepatitis) 04/04/2017   Per biopsy 9/18  . OCD (obsessive compulsive disorder)   . Orbital cellulitis    at age 79 was hospitalized for 3 months (almost died)   . OSA (obstructive sleep apnea)   . S/P appy 05/2013  . Sleep apnea     Past Surgical History:  Procedure Laterality Date  . APPENDECTOMY    . COLONOSCOPY    . DENTAL SURGERY     wisdom teeth  . ESOPHAGOGASTRODUODENOSCOPY    . LAPAROSCOPIC APPENDECTOMY N/A 06/06/2013   Procedure: APPENDECTOMY LAPAROSCOPIC;  Surgeon: Imogene Burn. Georgette Dover, MD;  Location: [place] OR;  Service: General;  Laterality: N/A;  . LIVER BIOPSY      Current Medications: Current Meds  Medication Sig  . ACCU-CHEK FASTCLIX LANCETS MISC Check sugar twice daily  . ACCU-CHEK SOFTCLIX LANCETS lancets Use as instructed  . Blood Glucose Monitoring Suppl (ACCU-CHEK GUIDE) w/Device KIT 1 each by Does not apply route daily.  . budesonide-formoterol  (SYMBICORT) 160-4.5 MCG/ACT inhaler Inhale 2 puffs into the lungs 2 (two) times daily.  . cetirizine (ZYRTEC) 10 MG tablet Take 10 mg by mouth daily.  . diazepam (VALIUM) 10 MG tablet   . dicyclomine (BENTYL) 20 MG tablet TAKE 1 TABLET BY MOUTH 4 TIMES DAILY BEFORE MEALS AND AT BEDTIME  . escitalopram (LEXAPRO) 20 MG tablet Take 1 tablet (20 mg total) by mouth daily.  . fluticasone (FLONASE) 50 MCG/ACT nasal spray Place 2 sprays into both nostrils daily as needed for allergies.   . furosemide (LASIX) 40 MG tablet TAKE ONE TABLET BY MOUTH EVERY MORNING  . glucose blood (ACCU-CHEK GUIDE) test strip Use as instructed to check blood sugar twice day.  . losartan (COZAAR) 100 MG tablet TAKE ONE TABLET BY MOUTH DAILY  . meloxicam (MOBIC) 15 MG tablet TAKE ONE TABLET BY MOUTH DAILY FOR 2 WEEKS THEN AS NEEDED WITH FOOD  . metFORMIN (GLUCOPHAGE XR) 500 MG 24 hr tablet Take 2 tablets (1,000 mg total) by mouth daily with breakfast.  . metoprolol succinate (TOPROL-XL) 100 MG 24 hr tablet Take 1 tablet (100 mg total) daily by mouth. Take with or immediately following a meal.  . mirtazapine (REMERON) 15 MG tablet TAKE ONE TABLET BY MOUTH AT BEDTIME  . montelukast (SINGULAIR) 10 MG tablet TAKE ONE TABLET BY MOUTH AT BEDTIME  . Multiple Vitamins-Minerals (MULTIVITAMIN WITH MINERALS) tablet Take 1 tablet by mouth daily.  Marland Kitchen omeprazole (PRILOSEC) 20 MG capsule TAKE 2 CAPSULES BY MOUTH EVERY MORNING BEFORE BREAKFAST AND 1 CAPSULE EVERY EVENING BEFORE SUPPER  . ondansetron (ZOFRAN ODT) 4 MG disintegrating tablet Take 1 tablet (4 mg total) every 8 (eight) hours as needed by mouth for nausea or vomiting.  . potassium chloride (K-DUR,KLOR-CON) 10 MEQ tablet TAKE ONE TABLET BY MOUTH EVERY OTHER DAY  . SUMAtriptan (IMITREX) 50 MG tablet Take 1 tablet (50 mg total) by mouth every 2 (two) hours as needed for migraine. May repeat in 2 hours if headache persists or recurs.  . traZODone (DESYREL) 50 MG tablet Take 1-2 tablets  (50-100 mg total) by mouth at bedtime. (Patient taking differently: Take 50-100 mg by mouth at bedtime as needed. )  . Vitamin D, Ergocalciferol, 2000 units CAPS Take 2,000 Units by mouth daily.   Current Facility-Administered Medications for the 11/02/17 encounter (Office Visit) with Revankar, Reita Cliche, MD  Medication  . 0.9 %  sodium chloride infusion     Allergies:   Codeine; Amlodipine; Dust mite extract; Mold extract [trichophyton]; and Tape   Social History   Socioeconomic History  . Marital status: Single    Spouse name: Not on file  . Number of children: 0  . Years of education: Not on file  . Highest education level: Not on file  Occupational History  . Occupation: Ship broker  . Occupation: Programmer, applications  Social Needs  . Financial resource strain: Not on file  . Food insecurity:    Worry: Not on file    Inability: Not on file  . Transportation needs:    Medical: Not on file    Non-medical: Not on file  Tobacco Use  . Smoking status: Former Smoker    Packs/day: 1.00    Years: 20.00    Pack years: 20.00    Types: Cigarettes    Last attempt to quit: 02/22/2013    Years since quitting: 4.6  . Smokeless tobacco: Never Used  Substance and Sexual Activity  . Alcohol use: Yes    Alcohol/week: 0.0 oz    Comment: social  . Drug use: No  . Sexual activity: Not on file  Lifestyle  . Physical activity:    Days per week: Not on file    Minutes per session: Not on file  . Stress: Not on file  Relationships  . Social connections:    Talks on phone: Not on file    Gets together: Not on file    Attends religious service: Not on file    Active member of club or organization: Not on file    Attends meetings of clubs or organizations: Not on file    Relationship status: Not on file  Other Topics Concern  . Not on file  Social History Narrative   Working at [employer] doing [employer] alone (has a Neurosurgeon)   Working on a degree at [employer], will plan to return fall 2018     Enjoys spending time with family.   Friends and family in [place]     Family History: The patient's family history includes AAA (abdominal aortic aneurysm) in her paternal grandmother; Asthma in her mother and sister; Breast cancer in her other; Breast cancer (age of onset: 63) in her father; Cancer in her other; Cervical cancer in her maternal aunt; Colon cancer (age of onset: 70) in her maternal grandmother; Colon cancer (age of onset: 9) in her other; Colon polyps in her maternal grandfather; Diabetes in her father; Heart disease in her father and paternal grandfather; Heart disease (age of onset: 67) in her paternal uncle; Hyperlipidemia in her brother and father; Hypertension in her brother, father, and mother; Liver cancer in her maternal grandmother; Melanoma (age of onset: 71) in her maternal grandfather; Stomach cancer (age of onset: 71) in her other; Stroke (age of onset: 63) in her paternal uncle; Vision loss in her brother. There is no history of Esophageal cancer, Pancreatic cancer, or Rectal cancer.  ROS:   Please see the history of present illness.    All other systems reviewed and are negative.  EKGs/Labs/Other Studies Reviewed:    The following studies were reviewed today: EKG reveals sinus rhythm and nonspecific ST changes.   Recent Labs: 02/15/2017: TSH 2.37 02/21/2017: B Natriuretic Peptide 20.7 06/09/2017: Magnesium 1.9 08/24/2017: Hemoglobin 13.2 10/02/2017: ALT 115; BUN 14; Creatinine 0.92; Platelet Count 235; Potassium 4.1; Sodium 139  Recent Lipid Panel    Component Value Date/Time   CHOL 197 11/21/2016 1709   TRIG 316.0 (H) 11/21/2016 1709   HDL 37.90 (L) 11/21/2016 1709   CHOLHDL 5 11/21/2016 1709   VLDL 63.2 (H) 11/21/2016 1709   LDLDIRECT 126.0 11/21/2016 1709    Physical Exam:    VS:  BP 132/80 (BP Location: Left Arm, Patient Position: Sitting, Cuff Size: Normal)   Pulse 68   Ht 5' 6"  (1.676 m)  Wt 276 lb (125.2 kg)   SpO2 98%   BMI 44.55  kg/m     Wt Readings from Last 3 Encounters:  11/02/17 276 lb (125.2 kg)  10/02/17 278 lb 12.8 oz (126.5 kg)  10/02/17 278 lb (126.1 kg)     GEN: Patient is in no acute distress HEENT: Normal NECK: No JVD; No carotid bruits LYMPHATICS: No lymphadenopathy CARDIAC: S1 S2 regular, 2/6 systolic murmur at the apex. RESPIRATORY:  Clear to auscultation without rales, wheezing or rhonchi  ABDOMEN: Soft, non-tender, non-distended MUSCULOSKELETAL:  No edema; No deformity  SKIN: Warm and dry NEUROLOGIC:  Alert and oriented x 3 PSYCHIATRIC:  Normal affect    Signed, Jenean Lindau, MD  11/02/2017 4:18 PM    [facility]

## 2017-11-02 NOTE — Patient Instructions (Signed)
Medication Instructions:  Your physician recommends that you continue on your current medications as directed. Please refer to the Current Medication list given to you today.  Labwork: Your physician recommends that you have the following labs drawn: Pregnancy test  Testing/Procedures: Non-Cardiac CT scanning, (CAT scanning), is a noninvasive, special x-ray that produces cross-sectional images of the body using x-rays and a computer. CT scans help physicians diagnose and treat medical conditions. For some CT exams, a contrast material is used to enhance visibility in the area of the body being studied. CT scans provide greater clarity and reveal more details than regular x-ray exams.  Your physician has requested that you have an echocardiogram. Echocardiography is a painless test that uses sound waves to create images of your heart. It provides your doctor with information about the size and shape of your heart and how well your heart's chambers and valves are working. This procedure takes approximately one hour. There are no restrictions for this procedure.  Follow-Up: Your physician recommends that you schedule a follow-up appointment in: 6 months  Any Other Special Instructions Will Be Listed Below (If Applicable).     If you need a refill on your cardiac medications before your next appointment, please call your pharmacy.   [place], RN, BSN

## 2017-11-03 NOTE — Addendum Note (Signed)
Addended by: Mattie Marlin on: 11/03/2017 08:10 AM   Modules accepted: Orders

## 2017-11-04 LAB — PREGNANCY, URINE: PREG TEST UR: NEGATIVE

## 2017-11-04 LAB — SPECIMEN STATUS REPORT

## 2017-11-08 ENCOUNTER — Encounter: Payer: Self-pay | Admitting: Family

## 2017-11-09 ENCOUNTER — Encounter (INDEPENDENT_AMBULATORY_CARE_PROVIDER_SITE_OTHER): Payer: Self-pay

## 2017-11-09 ENCOUNTER — Encounter: Payer: Self-pay | Admitting: Family

## 2017-11-11 NOTE — Telephone Encounter (Signed)
Ivin Booty, have you received fmla paperwork for this patient?

## 2017-11-13 ENCOUNTER — Encounter: Payer: Self-pay | Admitting: Family

## 2017-11-13 ENCOUNTER — Inpatient Hospital Stay: Payer: Managed Care, Other (non HMO) | Attending: Hematology & Oncology | Admitting: Family

## 2017-11-13 ENCOUNTER — Inpatient Hospital Stay: Payer: Managed Care, Other (non HMO)

## 2017-11-13 ENCOUNTER — Other Ambulatory Visit: Payer: Self-pay

## 2017-11-13 ENCOUNTER — Ambulatory Visit (INDEPENDENT_AMBULATORY_CARE_PROVIDER_SITE_OTHER): Payer: Managed Care, Other (non HMO) | Admitting: Family

## 2017-11-13 VITALS — BP 120/66 | HR 66 | Temp 98.4°F | Resp 19 | Wt 273.0 lb

## 2017-11-13 VITALS — BP 120/66 | HR 63 | Temp 98.4°F | Resp 18 | Ht 66.0 in | Wt 272.8 lb

## 2017-11-13 DIAGNOSIS — D72828 Other elevated white blood cell count: Secondary | ICD-10-CM | POA: Insufficient documentation

## 2017-11-13 DIAGNOSIS — E559 Vitamin D deficiency, unspecified: Secondary | ICD-10-CM

## 2017-11-13 DIAGNOSIS — K909 Intestinal malabsorption, unspecified: Secondary | ICD-10-CM | POA: Diagnosis not present

## 2017-11-13 DIAGNOSIS — L918 Other hypertrophic disorders of the skin: Secondary | ICD-10-CM | POA: Diagnosis not present

## 2017-11-13 DIAGNOSIS — C220 Liver cell carcinoma: Secondary | ICD-10-CM | POA: Insufficient documentation

## 2017-11-13 DIAGNOSIS — Z79899 Other long term (current) drug therapy: Secondary | ICD-10-CM | POA: Insufficient documentation

## 2017-11-13 DIAGNOSIS — D508 Other iron deficiency anemias: Secondary | ICD-10-CM | POA: Diagnosis not present

## 2017-11-13 DIAGNOSIS — D500 Iron deficiency anemia secondary to blood loss (chronic): Secondary | ICD-10-CM

## 2017-11-13 DIAGNOSIS — D510 Vitamin B12 deficiency anemia due to intrinsic factor deficiency: Secondary | ICD-10-CM

## 2017-11-13 DIAGNOSIS — N920 Excessive and frequent menstruation with regular cycle: Secondary | ICD-10-CM | POA: Diagnosis not present

## 2017-11-13 LAB — CBC WITH DIFFERENTIAL (CANCER CENTER ONLY)
BASOS ABS: 0.1 10*3/uL (ref 0.0–0.1)
BASOS PCT: 1 %
EOS ABS: 0.5 10*3/uL (ref 0.0–0.5)
Eosinophils Relative: 4 %
HCT: 40.3 % (ref 34.8–46.6)
Hemoglobin: 13.7 g/dL (ref 11.6–15.9)
Lymphocytes Relative: 37 %
Lymphs Abs: 4.2 10*3/uL — ABNORMAL HIGH (ref 0.9–3.3)
MCH: 30.5 pg (ref 26.0–34.0)
MCHC: 34 g/dL (ref 32.0–36.0)
MCV: 89.8 fL (ref 81.0–101.0)
MONO ABS: 0.7 10*3/uL (ref 0.1–0.9)
MONOS PCT: 6 %
Neutro Abs: 5.9 10*3/uL (ref 1.5–6.5)
Neutrophils Relative %: 52 %
PLATELETS: 212 10*3/uL (ref 145–400)
RBC: 4.49 MIL/uL (ref 3.70–5.32)
RDW: 13.3 % (ref 11.1–15.7)
WBC Count: 11.4 10*3/uL — ABNORMAL HIGH (ref 3.9–10.0)

## 2017-11-13 LAB — CMP (CANCER CENTER ONLY)
ALT: 107 U/L — ABNORMAL HIGH (ref 10–47)
ANION GAP: 8 (ref 5–15)
AST: 79 U/L — AB (ref 11–38)
Albumin: 3.5 g/dL (ref 3.5–5.0)
Alkaline Phosphatase: 107 U/L — ABNORMAL HIGH (ref 26–84)
BILIRUBIN TOTAL: 0.5 mg/dL (ref 0.2–1.6)
BUN: 11 mg/dL (ref 7–22)
CALCIUM: 9.5 mg/dL (ref 8.0–10.3)
CHLORIDE: 108 mmol/L (ref 98–108)
CO2: 28 mmol/L (ref 18–33)
Creatinine: 1 mg/dL (ref 0.60–1.20)
Glucose, Bld: 146 mg/dL — ABNORMAL HIGH (ref 73–118)
POTASSIUM: 3.8 mmol/L (ref 3.3–4.7)
Sodium: 144 mmol/L (ref 128–145)
Total Protein: 7.2 g/dL (ref 6.4–8.1)

## 2017-11-13 LAB — VITAMIN B12: VITAMIN B 12: 499 pg/mL (ref 180–914)

## 2017-11-13 NOTE — Progress Notes (Signed)
Subjective:    Patient ID: Stephanie Miles, female    DOB: February 15, 1981, 37 y.o.   MRN: [id number]  HPI   Patient is a 37 yr old female who presents today for skin tag removal.  Unfortunately, since her last visit she was diagnosed with hepatocellular cancer which is stage III.  She is following with Dr. Monica Martinez at [facility]. 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 Per review of his note, they are recommending TACE therapy possibly followed by more focal ablations with either RFA or CyberKnife treatments. (reviewed in care everywhere)    Review of Systems See HPI  Past Medical History:  Diagnosis Date  . Allergy   . Anxiety   . Aortic regurgitation due to bicuspid aortic valve   . Arthritis   . Borderline diabetes 10/20/2016  . Chronic kidney disease    kidney stones  . Depression   . Diabetes type 2, controlled ([place]) 10/20/2016  . Family history of breast cancer in female   . Family history of colon cancer   . Fatty liver 11/28/2016  . GERD (gastroesophageal reflux disease)   . Heart murmur   . Hypertension   . IBS (irritable bowel syndrome)   . Insomnia   . Migraine headache   . NASH (nonalcoholic steatohepatitis) 04/04/2017   Per biopsy 9/18  . OCD (obsessive compulsive disorder)   . Orbital cellulitis    at age 53 was hospitalized for 3 months (almost died)   . OSA (obstructive sleep apnea)   . S/P appy 05/2013  . Sleep apnea      Social History   Socioeconomic History  . Marital status: Single    Spouse name: Not on file  . Number of children: 0  . Years of education: Not on file  . Highest education level: Not on file  Occupational History  . Occupation: Ship broker  . Occupation: Programmer, applications  Social Needs  . Financial resource strain: Not on file  . Food insecurity:    Worry: Not on file    Inability: Not on file  . Transportation needs:    Medical: Not on file    Non-medical: Not on file  Tobacco Use  . Smoking  status: Former Smoker    Packs/day: 1.00    Years: 20.00    Pack years: 20.00    Types: Cigarettes    Last attempt to quit: 02/22/2013    Years since quitting: 4.7  . Smokeless tobacco: Never Used  Substance and Sexual Activity  . Alcohol use: Yes    Alcohol/week: 0.0 oz    Comment: social  . Drug use: No  . Sexual activity: Not on file  Lifestyle  . Physical activity:    Days per week: Not on file    Minutes per session: Not on file  . Stress: Not on file  Relationships  . Social connections:    Talks on phone: Not on file    Gets together: Not on file    Attends religious service: Not on file    Active member of club or organization: Not on file    Attends meetings of clubs or organizations: Not on file    Relationship status: Not on file  . Intimate partner violence:    Fear of current or ex partner: Not on file    Emotionally abused: Not on file    Physically abused: Not on file    Forced sexual activity: Not  on file  Other Topics Concern  . Not on file  Social History Narrative   Working at [employer] doing [employer] alone (has a Neurosurgeon)   Working on a degree at [employer], will plan to return fall 2018   Enjoys spending time with family.   Friends and family in [place]    Past Surgical History:  Procedure Laterality Date  . APPENDECTOMY    . COLONOSCOPY    . DENTAL SURGERY     wisdom teeth  . ESOPHAGOGASTRODUODENOSCOPY    . LAPAROSCOPIC APPENDECTOMY N/A 06/06/2013   Procedure: APPENDECTOMY LAPAROSCOPIC;  Surgeon: Imogene Burn. Georgette Dover, MD;  Location: [facility];  Service: General;  Laterality: N/A;  . LIVER BIOPSY      Family History  Problem Relation Age of Onset  . Asthma Mother   . Hypertension Mother   . Breast cancer Father 43  . Diabetes Father   . Hypertension Father   . Heart disease Father   . Hyperlipidemia Father   . Asthma Sister   . Hyperlipidemia Brother   . Hypertension Brother   . Vision loss Brother   . Colon cancer Maternal  Grandmother 44  . Liver cancer Maternal Grandmother   . Cervical cancer Maternal Aunt   . Stroke Paternal Uncle 26  . Melanoma Maternal Grandfather 38  . Colon polyps Maternal Grandfather   . AAA (abdominal aortic aneurysm) Paternal Grandmother   . Heart disease Paternal Grandfather   . Heart disease Paternal Uncle 61  . Colon cancer Other 25       MGM's mother  . Stomach cancer Other 5       MGM's mother  . Breast cancer Other        MGM's maternal aunt  . Cancer Other        MGM's maternal uncle with GI cancer  . Esophageal cancer Neg Hx   . Pancreatic cancer Neg Hx   . Rectal cancer Neg Hx     Allergies  Allergen Reactions  . Codeine Nausea Only    Needs antiemetic Needs antiemetic  . Amlodipine Other (See Comments)    EDEMA EDEMA  . Dust Mite Extract   . Mold Extract [Trichophyton]   . Tape Rash    Tapes, bandaids Tapes, bandaids    Current Outpatient Medications on File Prior to Visit  Medication Sig Dispense Refill  . ACCU-CHEK FASTCLIX LANCETS MISC Check sugar twice daily 102 each 3  . ACCU-CHEK SOFTCLIX LANCETS lancets Use as instructed 100 each 1  . Blood Glucose Monitoring Suppl (ACCU-CHEK GUIDE) w/Device KIT 1 each by Does not apply route daily. 1 kit 0  . budesonide-formoterol (SYMBICORT) 160-4.5 MCG/ACT inhaler Inhale 2 puffs into the lungs 2 (two) times daily. 1 Inhaler 3  . cetirizine (ZYRTEC) 10 MG tablet Take 10 mg by mouth daily.    Marland Kitchen dicyclomine (BENTYL) 20 MG tablet TAKE ONE TABLET BY MOUTH FOUR TIMES A DAY BEFORE MEALS AND AT BEDTIME 120 tablet 4  . escitalopram (LEXAPRO) 20 MG tablet Take 1 tablet (20 mg total) by mouth daily. 30 tablet 1  . fluticasone (FLONASE) 50 MCG/ACT nasal spray Place 2 sprays into both nostrils daily as needed for allergies.     . furosemide (LASIX) 40 MG tablet TAKE ONE TABLET BY MOUTH EVERY MORNING 30 tablet 3  . glucose blood (ACCU-CHEK GUIDE) test strip Use as instructed to check blood sugar twice day. 100 each 1  .  losartan (COZAAR) 100  MG tablet TAKE ONE TABLET BY MOUTH DAILY 30 tablet 5  . meloxicam (MOBIC) 15 MG tablet TAKE ONE TABLET BY MOUTH DAILY FOR 2 WEEKS THEN AS NEEDED WITH FOOD (Patient taking differently: Use sparingly as needed.) 30 tablet 4  . metFORMIN (GLUCOPHAGE XR) 500 MG 24 hr tablet Take 2 tablets (1,000 mg total) by mouth daily with breakfast. 60 tablet 2  . metoprolol succinate (TOPROL-XL) 100 MG 24 hr tablet Take 1 tablet (100 mg total) daily by mouth. Take with or immediately following a meal. 30 tablet 3  . mirtazapine (REMERON) 15 MG tablet TAKE ONE TABLET BY MOUTH AT BEDTIME 30 tablet 5  . montelukast (SINGULAIR) 10 MG tablet TAKE ONE TABLET BY MOUTH AT BEDTIME 30 tablet 5  . Multiple Vitamins-Minerals (MULTIVITAMIN WITH MINERALS) tablet Take 1 tablet by mouth daily.    Marland Kitchen omeprazole (PRILOSEC) 20 MG capsule TAKE 2 CAPSULES BY MOUTH EVERY MORNING BEFORE BREAKFAST AND 1 CAPSULE EVERY EVENING BEFORE SUPPER 90 capsule 5  . ondansetron (ZOFRAN ODT) 4 MG disintegrating tablet Take 1 tablet (4 mg total) every 8 (eight) hours as needed by mouth for nausea or vomiting. 20 tablet 0  . potassium chloride (K-DUR,KLOR-CON) 10 MEQ tablet TAKE ONE TABLET BY MOUTH EVERY OTHER DAY 15 tablet 5  . SUMAtriptan (IMITREX) 50 MG tablet Take 1 tablet (50 mg total) by mouth every 2 (two) hours as needed for migraine. May repeat in 2 hours if headache persists or recurs. 10 tablet 0  . Vitamin D, Ergocalciferol, 2000 units CAPS Take 2,000 Units by mouth daily.     Current Facility-Administered Medications on File Prior to Visit  Medication Dose Route Frequency Provider Last Rate Last Dose  . 0.9 %  sodium chloride infusion  500 mL Intravenous Continuous Ladene Artist, MD        BP 120/66 (BP Location: Left Arm)   Pulse 63   Temp 98.4 F (36.9 C)   Resp 18   Ht _0  (1.676 m)   Wt 272 lb 12.8 oz (123.7 kg)   SpO2 99%   BMI 44.03 kg/m       Objective:   Physical Exam  Gen: Awake, alert,  NAD Skin:  One skin tag noted on anterior neck, 3 beneath left axilla and 1 on back.  Multiple smaller skin tags noted on neck.       Assessment & Plan:  Hepatocellular carcinoma- following at [facility].  Requests FMLA to date back to March 26 (date back) needs for appointments and intermittent leave.   Skin tags- several smaller skin tags frozen on anterior neck with liquid nitrogen.  Procedure including risks/benefits explained to patient.  Questions were answered. After informed consent was obtained and a time out completed, site was cleansed with betadine and then alcohol. 1% Lidocaine with epinephrine was injected under lesion and then skin tags were removed with sterile blade. Pt tolerated procedure well.  Specimen sent for pathology review.  Pt instructed to keep the area dry for 24 hours and to contact us if she develops redness, drainage or swelling at the site.  Pt may use tylenol as needed for discomfort today.

## 2017-11-13 NOTE — Progress Notes (Signed)
Hematology and Oncology Follow Up Visit  Stephanie Miles [id number] 1980/09/05 37 y.o. 11/13/2017   Principle Diagnosis:  Hepatocellular carcinoma stage IIIa - to be treated at [facility] Iron deficiency anemia Reactive leukocytosis  Current Therapy:   IV iron as indicated   Interim History:  Stephanie Miles is here today for follow-up. Unfortunately she has been diagnosed with hepatocellular carcinoma stage IIIa and is being followed by [facility] liver transplant Dr, Claybon Jabs. She states that they plan to do a chemo emobolization and possible resection. She is waiting to here from them to schedule.  She is still having occasional right upper quadrant pain. Her appetite comes and goes. She is trying her best to hydrate. Her weight is stable.  She is off her birth control and her cycle is still regular and heavy. No other bleeding noted, no bruising or petechiae.  She has had no fever, chills, n/v, cough, rash, dizziness, chest pain or changes in bowel or bladder habits.  Occasional palpitations are unchanged. She has SOB with over exertion at times due to allergy induced asthma.  No tenderness in her extremities. She has puffiness in her feet that comes and goes. The intermittent numbness and tingling in her hands and feet is unchanged.    ECOG Performance Status: 1 - Symptomatic but completely ambulatory  Medications:  Allergies as of 11/13/2017      Reactions   Codeine Nausea Only   Needs antiemetic Needs antiemetic   Amlodipine Other (See Comments)   EDEMA EDEMA   Dust Mite Extract    Mold Extract [trichophyton]    Tape Rash   Tapes, bandaids Tapes, bandaids      Medication List        Accurate as of 11/13/17  3:19 PM. Always use your most recent med list.          ACCU-CHEK GUIDE w/Device Kit 1 each by Does not apply route daily.   ACCU-CHEK SOFTCLIX LANCETS lancets Use as instructed   ACCU-CHEK FASTCLIX LANCETS Misc Check sugar twice daily     budesonide-formoterol 160-4.5 MCG/ACT inhaler Commonly known as:  SYMBICORT Inhale 2 puffs into the lungs 2 (two) times daily.   cetirizine 10 MG tablet Commonly known as:  ZYRTEC Take 10 mg by mouth daily.   diazepam 10 MG tablet Commonly known as:  VALIUM   dicyclomine 20 MG tablet Commonly known as:  BENTYL TAKE ONE TABLET BY MOUTH FOUR TIMES A DAY BEFORE MEALS AND AT BEDTIME   escitalopram 20 MG tablet Commonly known as:  LEXAPRO Take 1 tablet (20 mg total) by mouth daily.   fluticasone 50 MCG/ACT nasal spray Commonly known as:  FLONASE Place 2 sprays into both nostrils daily as needed for allergies.   furosemide 40 MG tablet Commonly known as:  LASIX TAKE ONE TABLET BY MOUTH EVERY MORNING   glucose blood test strip Commonly known as:  ACCU-CHEK GUIDE Use as instructed to check blood sugar twice day.   losartan 100 MG tablet Commonly known as:  COZAAR TAKE ONE TABLET BY MOUTH DAILY   meloxicam 15 MG tablet Commonly known as:  MOBIC TAKE ONE TABLET BY MOUTH DAILY FOR 2 WEEKS THEN AS NEEDED WITH FOOD   metFORMIN 500 MG 24 hr tablet Commonly known as:  GLUCOPHAGE XR Take 2 tablets (1,000 mg total) by mouth daily with breakfast.   metoprolol succinate 100 MG 24 hr tablet Commonly known as:  TOPROL-XL Take 1 tablet (100 mg total) daily by mouth.  Take with or immediately following a meal.   mirtazapine 15 MG tablet Commonly known as:  REMERON TAKE ONE TABLET BY MOUTH AT BEDTIME   montelukast 10 MG tablet Commonly known as:  SINGULAIR TAKE ONE TABLET BY MOUTH AT BEDTIME   multivitamin with minerals tablet Take 1 tablet by mouth daily.   omeprazole 20 MG capsule Commonly known as:  PRILOSEC TAKE 2 CAPSULES BY MOUTH EVERY MORNING BEFORE BREAKFAST AND 1 CAPSULE EVERY EVENING BEFORE SUPPER   ondansetron 4 MG disintegrating tablet Commonly known as:  ZOFRAN ODT Take 1 tablet (4 mg total) every 8 (eight) hours as needed by mouth for nausea or vomiting.    potassium chloride 10 MEQ tablet Commonly known as:  K-DUR,KLOR-CON TAKE ONE TABLET BY MOUTH EVERY OTHER DAY   SUMAtriptan 50 MG tablet Commonly known as:  IMITREX Take 1 tablet (50 mg total) by mouth every 2 (two) hours as needed for migraine. May repeat in 2 hours if headache persists or recurs.   traZODone 50 MG tablet Commonly known as:  DESYREL Take 1-2 tablets (50-100 mg total) by mouth at bedtime.   Vitamin D (Ergocalciferol) 2000 units Caps Take 2,000 Units by mouth daily.       Allergies:  Allergies  Allergen Reactions  . Codeine Nausea Only    Needs antiemetic Needs antiemetic  . Amlodipine Other (See Comments)    EDEMA EDEMA  . Dust Mite Extract   . Mold Extract [Trichophyton]   . Tape Rash    Tapes, bandaids Tapes, bandaids    Past Medical History, Surgical history, Social history, and Family History were reviewed and updated.  Review of Systems: All other 10 point review of systems is negative.   Physical Exam:  vitals were not taken for this visit.   Wt Readings from Last 3 Encounters:  11/02/17 276 lb (125.2 kg)  10/02/17 278 lb 12.8 oz (126.5 kg)  10/02/17 278 lb (126.1 kg)    Ocular: Sclerae unicteric, pupils equal, round and reactive to light Ear-nose-throat: Oropharynx clear, dentition fair Lymphatic: No cervical, supraclavicular or axillary adenopathy Lungs no rales or rhonchi, good excursion bilaterally Heart regular rate and rhythm, no murmur appreciated Abd soft, nontender, positive bowel sounds, no liver or spleen tip palpated on exam, no fluid wave  MSK no focal spinal tenderness, no joint edema Neuro: non-focal, well-oriented, appropriate affect Breasts: Deferred   Lab Results  Component Value Date   WBC 11.4 (H) 11/13/2017   HGB 13.7 11/13/2017   HCT 40.3 11/13/2017   MCV 89.8 11/13/2017   PLT 212 11/13/2017   Lab Results  Component Value Date   FERRITIN 235 10/02/2017   IRON 77 10/02/2017   TIBC 253 10/02/2017   UIBC  175 10/02/2017   IRONPCTSAT 31 10/02/2017   Lab Results  Component Value Date   RBC 4.49 11/13/2017   No results found for: Nils Pyle, [facility] Lab Results  Component Value Date   IGA 195 01/11/2017   Lab Results  Component Value Date   TOTALPROTELP 6.9 12/28/2016   ALBUMINELP 3.6 (L) 12/28/2016   A1GS 0.4 (H) 12/28/2016   A2GS 0.9 12/28/2016   BETS 0.6 12/28/2016   BETA2SER 0.4 12/28/2016   GAMS 1.0 12/28/2016   SPEI SEE NOTE 12/28/2016     Chemistry      Component Value Date/Time   NA 139 10/02/2017 1400   NA 142 07/07/2017 1202   K 4.1 10/02/2017 1400   K 3.9 07/07/2017 1202   CL  105 10/02/2017 1400   CL 104 07/07/2017 1202   CO2 26 10/02/2017 1400   CO2 28 07/07/2017 1202   BUN 14 10/02/2017 1400   BUN 11 07/07/2017 1202   CREATININE 0.92 10/02/2017 1400   CREATININE 1.1 07/07/2017 1202      Component Value Date/Time   CALCIUM 9.4 10/02/2017 1400   CALCIUM 9.2 07/07/2017 1202   ALKPHOS 108 10/02/2017 1400   ALKPHOS 92 (H) 07/07/2017 1202   AST 65 (H) 10/02/2017 1400   ALT 115 (H) 10/02/2017 1400   ALT 143 (H) 07/07/2017 1202   BILITOT 0.3 10/02/2017 1400      Impression and Plan: Stephanie Miles is a very pleasant 58 yo caucasian female with reactive leukocytosis and iron deficiency anemia secondary to heavy cycles and malabsorption.  She has now been diagnosed with hepatocellular carcinoma stage IIIa and is being followed by Dr. Monica Martinez with [facility].  We will see what her iron studies show and bring her back in for infusion if needed.  We are available for any supportive care needed throughout her treatments.  We will continue to follow along with her and plan to see her back in 6 weeks.  She promises to contact our office with any questions or concerns. We can certainly see her sooner if need be.   Laverna Peace, NP 4/22/20193:19 PM

## 2017-11-13 NOTE — Patient Instructions (Signed)
Call if increased redness/drainage from treated skin sites.

## 2017-11-14 ENCOUNTER — Telehealth: Payer: Self-pay | Admitting: *Deleted

## 2017-11-14 LAB — IRON AND TIBC
Iron: 46 ug/dL (ref 41–142)
Saturation Ratios: 17 % — ABNORMAL LOW (ref 21–57)
TIBC: 269 ug/dL (ref 236–444)
UIBC: 222 ug/dL

## 2017-11-14 LAB — VITAMIN D 25 HYDROXY (VIT D DEFICIENCY, FRACTURES): VIT D 25 HYDROXY: 31.2 ng/mL (ref 30.0–100.0)

## 2017-11-14 LAB — FERRITIN: Ferritin: 244 ng/mL (ref 9–269)

## 2017-11-14 NOTE — Telephone Encounter (Signed)
Received FMLA/STD paperwork from [facility], will complete as much as possible; provider is aware/SLS

## 2017-11-14 NOTE — Telephone Encounter (Signed)
Thank you! I have let pt know we are working on it.

## 2017-11-14 NOTE — Telephone Encounter (Signed)
Stephanie Miles-- pt said FMLA was faxed to [place] on 4/17 and 11/09/17. Can you tell us if it has come through?  Debbrah Alar, NP     6:46 PM  Note    Stephanie Miles, have you received fmla paperwork for this patient?

## 2017-11-17 ENCOUNTER — Encounter: Payer: Self-pay | Admitting: Family

## 2017-11-17 NOTE — Telephone Encounter (Signed)
Forms completed and faxed to [employer] at [phone]. Message sent to pt.

## 2017-11-21 ENCOUNTER — Inpatient Hospital Stay: Payer: Managed Care, Other (non HMO)

## 2017-11-23 ENCOUNTER — Encounter: Payer: Self-pay | Admitting: Family

## 2017-11-24 NOTE — Telephone Encounter (Signed)
Can you please see if we can obtain copy of fmla from scanning and I will update?

## 2017-11-27 NOTE — Telephone Encounter (Signed)
Forms placed in PCP's red folder for update.

## 2017-11-28 ENCOUNTER — Ambulatory Visit (HOSPITAL_BASED_OUTPATIENT_CLINIC_OR_DEPARTMENT_OTHER)
Admission: RE | Admit: 2017-11-28 | Discharge: 2017-11-28 | Disposition: A | Discharge status: Home / Self Care | Payer: Managed Care, Other (non HMO) | Source: Ambulatory Visit | Attending: Cardiology | Admitting: Cardiology

## 2017-11-28 ENCOUNTER — Ambulatory Visit (HOSPITAL_BASED_OUTPATIENT_CLINIC_OR_DEPARTMENT_OTHER)
Admission: RE | Admit: 2017-11-28 | Discharge: 2017-11-28 | Disposition: A | Discharge status: Home / Self Care | Payer: Managed Care, Other (non HMO) | Source: Ambulatory Visit | Attending: Family | Admitting: Family

## 2017-11-28 DIAGNOSIS — K769 Liver disease, unspecified: Secondary | ICD-10-CM | POA: Diagnosis not present

## 2017-11-28 DIAGNOSIS — I517 Cardiomegaly: Secondary | ICD-10-CM | POA: Diagnosis not present

## 2017-11-28 DIAGNOSIS — Q231 Congenital insufficiency of aortic valve: Secondary | ICD-10-CM | POA: Insufficient documentation

## 2017-11-28 MED ORDER — IOPAMIDOL (ISOVUE-370) INJECTION 76%
100.0000 mL | Freq: Once | INTRAVENOUS | Status: AC | PRN
  Administered 2017-11-28: 100 mL via INTRAVENOUS

## 2017-11-28 NOTE — Progress Notes (Signed)
  Echocardiogram 2D Echocardiogram has been performed.  Stephanie Miles 11/28/2017, 1:53 PM

## 2017-11-28 NOTE — Telephone Encounter (Signed)
Form has been updated.  Could you please notify pt?

## 2017-11-28 NOTE — Telephone Encounter (Signed)
Updated forms faxed and message sent to pt.

## 2017-11-29 ENCOUNTER — Other Ambulatory Visit: Payer: Self-pay | Admitting: Family

## 2017-11-29 ENCOUNTER — Other Ambulatory Visit: Payer: Self-pay | Admitting: Internal Medicine

## 2017-11-29 DIAGNOSIS — R0600 Dyspnea, unspecified: Secondary | ICD-10-CM

## 2017-11-29 DIAGNOSIS — R05 Cough: Secondary | ICD-10-CM

## 2017-11-29 DIAGNOSIS — R059 Cough, unspecified: Secondary | ICD-10-CM

## 2017-11-29 NOTE — Telephone Encounter (Signed)
Melissa-- looks like symbicort was last sent for pt in 02/2017.  Please advise refill?

## 2017-12-05 NOTE — Telephone Encounter (Signed)
Yes, rx was sent on 5/10. Thanks.

## 2017-12-07 ENCOUNTER — Encounter: Payer: Self-pay | Admitting: Family

## 2017-12-07 ENCOUNTER — Ambulatory Visit (INDEPENDENT_AMBULATORY_CARE_PROVIDER_SITE_OTHER): Payer: Managed Care, Other (non HMO) | Admitting: Family Medicine

## 2017-12-07 ENCOUNTER — Encounter: Payer: Self-pay | Admitting: Family Medicine

## 2017-12-07 VITALS — BP 128/86 | HR 76 | Temp 98.6°F | Ht 66.0 in | Wt 266.2 lb

## 2017-12-07 DIAGNOSIS — Z1231 Encounter for screening mammogram for malignant neoplasm of breast: Secondary | ICD-10-CM

## 2017-12-07 DIAGNOSIS — J40 Bronchitis, not specified as acute or chronic: Secondary | ICD-10-CM | POA: Diagnosis not present

## 2017-12-07 DIAGNOSIS — J029 Acute pharyngitis, unspecified: Secondary | ICD-10-CM | POA: Diagnosis not present

## 2017-12-07 LAB — POCT RAPID STREP A (OFFICE): RAPID STREP A SCREEN: NEGATIVE

## 2017-12-07 MED ORDER — METHYLPREDNISOLONE ACETATE 80 MG/ML IJ SUSP
80.0000 mg | Freq: Once | INTRAMUSCULAR | Status: AC
  Administered 2017-12-07: 80 mg via INTRAMUSCULAR

## 2017-12-07 MED ORDER — BENZONATATE 100 MG PO CAPS: 100.0000 mg | ORAL_CAPSULE | Freq: Three times a day (TID) | ORAL | 0 refills | Status: DC | PRN

## 2017-12-07 NOTE — Progress Notes (Signed)
Chief Complaint  Patient presents with  . Cough  . Wheezing  . Sore Throat    Stephanie Miles here for URI complaints.  Duration: 1 day Associated symptoms: Fever (99.5 F), ear pain, sore throat, wheezing, shortness of breath and cough Denies: sinus congestion, sinus pain, rhinorrhea, itchy watery eyes, ear drainage and myalgia Treatment to date: none Sick contacts: No  ROS:  Const: Denies current fevers HEENT: As noted in HPI Lungs: +cough  Past Medical History:  Diagnosis Date  . Allergy   . Anxiety   . Aortic regurgitation due to bicuspid aortic valve   . Arthritis   . Borderline diabetes 10/20/2016  . Chronic kidney disease    kidney stones  . Depression   . Diabetes type 2, controlled ([place]) 10/20/2016  . Family history of breast cancer in female   . Family history of colon cancer   . Fatty liver 11/28/2016  . GERD (gastroesophageal reflux disease)   . Heart murmur   . Hypertension   . IBS (irritable bowel syndrome)   . Insomnia   . Migraine headache   . NASH (nonalcoholic steatohepatitis) 04/04/2017   Per biopsy 9/18  . OCD (obsessive compulsive disorder)   . Orbital cellulitis    at age 87 was hospitalized for 3 months (almost died)   . OSA (obstructive sleep apnea)   . S/P appy 05/2013  . Sleep apnea     BP 128/86 (BP Location: Left Arm, Patient Position: Sitting, Cuff Size: Large)   Pulse 76   Temp 98.6 F (37 C) (Oral)   Ht 5' 6"  (1.676 m)   Wt 266 lb 4 oz (120.8 kg)   SpO2 94%   BMI 42.97 kg/m  General: Awake, alert, appears stated age HEENT: AT, [place], ears patent b/l and TM's neg, nares patent w/o discharge, pharynx pink and without exudates, MMM Neck: No masses or asymmetry Heart: RRR Lungs: CTAB, no accessory muscle use Psych: Age appropriate judgment and insight, normal mood and affect  Bronchitis - Plan: benzonatate (TESSALON) 100 MG capsule, methylPREDNISolone acetate (DEPO-MEDROL) injection 80 mg  Sore throat - Plan: POCT rapid strep A,  methylPREDNISolone acetate (DEPO-MEDROL) injection 80 mg  Orders as above. Rapid Strep neg.  Supportive care. Continue to push fluids, practice good hand hygiene, cover mouth when coughing. F/u prn. If starting to experience fevers, shaking, or shortness of breath, seek immediate care. Pt voiced understanding and agreement to the plan.  Campbell, DO 12/07/17 10:53 AM

## 2017-12-07 NOTE — Patient Instructions (Addendum)
I think you have bronchitis. Most causes of bronchitis are viral in nature. If we prescribe antibiotics for viral illnesses, not only will it fail to help you get better, but has also been shown to do harm.    Continue to push fluids, practice good hand hygiene, and cover your mouth if you cough.  If you start having fevers, shaking or shortness of breath, seek immediate care.  Salt water gargles, throat lozenges, air humidifiers may be helpful.   Send MyChart message after holiday weekend if no better.  Let us know if you need anything.

## 2017-12-07 NOTE — Progress Notes (Signed)
Pre visit review using our clinic review tool, if applicable. No additional management support is needed unless otherwise documented below in the visit note. 

## 2017-12-08 NOTE — Telephone Encounter (Signed)
MRI order placed.

## 2017-12-11 ENCOUNTER — Encounter: Payer: Self-pay | Admitting: Family

## 2017-12-11 NOTE — Telephone Encounter (Addendum)
Received request from [facility] requesting additional clinical information. Last office note and previous mammogram results were faxed to [phone]. Awaiting determination. Mammogram order for 12/15/17 has been cancelled and message was sent to pt to make her aware.

## 2017-12-11 NOTE — Telephone Encounter (Signed)
Stephanie Miles-- please see pt's request for something to help URI symptoms. Was seen by Dr Nani Ravens for the same.

## 2017-12-12 MED ORDER — AZITHROMYCIN 250 MG PO TABS: ORAL_TABLET | ORAL | 0 refills | Status: DC

## 2017-12-12 NOTE — Addendum Note (Signed)
Addended by: Debbrah Alar on: 12/12/2017 07:09 AM   Modules accepted: Orders

## 2017-12-14 ENCOUNTER — Encounter: Payer: Self-pay | Admitting: Family

## 2017-12-14 ENCOUNTER — Ambulatory Visit: Payer: Managed Care, Other (non HMO)

## 2017-12-14 NOTE — Telephone Encounter (Signed)
Gwen, please see pt's mychart message. Can we re-submit with that code? If not, is there a number I can call to complete a peer to peer please.

## 2017-12-15 ENCOUNTER — Ambulatory Visit: Payer: Managed Care, Other (non HMO)

## 2017-12-15 NOTE — Telephone Encounter (Signed)
Breast MRI has been approved and the number is [id number]. Could you please make sure that it gets scheduled?  Thanks.

## 2017-12-15 NOTE — Telephone Encounter (Signed)
Stephanie Miles -- Meredith Mody says she did not realize she had already submitted the request with the code she told you about and it was denied. Paperwork is on my desk.

## 2017-12-25 ENCOUNTER — Other Ambulatory Visit: Payer: Managed Care, Other (non HMO)

## 2017-12-25 ENCOUNTER — Ambulatory Visit: Payer: Managed Care, Other (non HMO) | Admitting: Family

## 2017-12-25 ENCOUNTER — Telehealth: Payer: Self-pay

## 2017-12-25 NOTE — Telephone Encounter (Signed)
She has been followed at [place] since 08/2017.  Please cancel follow up studies and office appts scheduled with [place].

## 2017-12-25 NOTE — Telephone Encounter (Signed)
-----   Message from Marlon Pel, RN sent at 08/29/2017  1:39 PM EST ----- Needs follow up MRI- see results on 12/18[facility] Plan

## 2017-12-25 NOTE — Telephone Encounter (Signed)
I spoke with the patient and she is being followed by [facility] for hepatocellular carcinoma and they are following her liver MRI.  She declined to schedule MRI that was recommended on last MRI 12/18 MRI

## 2017-12-25 NOTE — Telephone Encounter (Signed)
Left message for patient to call back  

## 2018-01-01 ENCOUNTER — Other Ambulatory Visit: Payer: Self-pay | Admitting: Family

## 2018-01-12 ENCOUNTER — Other Ambulatory Visit: Payer: Self-pay | Admitting: Family

## 2018-01-12 ENCOUNTER — Ambulatory Visit
Admission: RE | Admit: 2018-01-12 | Discharge: 2018-01-12 | Disposition: A | Discharge status: Home / Self Care | Payer: Managed Care, Other (non HMO) | Source: Ambulatory Visit | Attending: Family | Admitting: Family

## 2018-01-12 DIAGNOSIS — Z1231 Encounter for screening mammogram for malignant neoplasm of breast: Secondary | ICD-10-CM

## 2018-01-13 ENCOUNTER — Other Ambulatory Visit: Payer: Self-pay

## 2018-01-13 ENCOUNTER — Emergency Department (HOSPITAL_BASED_OUTPATIENT_CLINIC_OR_DEPARTMENT_OTHER)
Admission: EM | Admit: 2018-01-13 | Discharge: 2018-01-13 | Disposition: A | Discharge status: Home / Self Care | Payer: Managed Care, Other (non HMO) | Attending: Emergency Medicine | Admitting: Emergency Medicine

## 2018-01-13 ENCOUNTER — Emergency Department (HOSPITAL_BASED_OUTPATIENT_CLINIC_OR_DEPARTMENT_OTHER): Payer: Managed Care, Other (non HMO)

## 2018-01-13 ENCOUNTER — Encounter (HOSPITAL_BASED_OUTPATIENT_CLINIC_OR_DEPARTMENT_OTHER): Payer: Self-pay | Admitting: *Deleted

## 2018-01-13 DIAGNOSIS — Z7984 Long term (current) use of oral hypoglycemic drugs: Secondary | ICD-10-CM | POA: Insufficient documentation

## 2018-01-13 DIAGNOSIS — Z87891 Personal history of nicotine dependence: Secondary | ICD-10-CM | POA: Insufficient documentation

## 2018-01-13 DIAGNOSIS — N390 Urinary tract infection, site not specified: Secondary | ICD-10-CM

## 2018-01-13 DIAGNOSIS — Z8505 Personal history of malignant neoplasm of liver: Secondary | ICD-10-CM | POA: Diagnosis not present

## 2018-01-13 DIAGNOSIS — Z79899 Other long term (current) drug therapy: Secondary | ICD-10-CM | POA: Diagnosis not present

## 2018-01-13 DIAGNOSIS — E119 Type 2 diabetes mellitus without complications: Secondary | ICD-10-CM | POA: Insufficient documentation

## 2018-01-13 DIAGNOSIS — I129 Hypertensive chronic kidney disease with stage 1 through stage 4 chronic kidney disease, or unspecified chronic kidney disease: Secondary | ICD-10-CM | POA: Diagnosis not present

## 2018-01-13 DIAGNOSIS — R509 Fever, unspecified: Secondary | ICD-10-CM

## 2018-01-13 DIAGNOSIS — N189 Chronic kidney disease, unspecified: Secondary | ICD-10-CM | POA: Diagnosis not present

## 2018-01-13 HISTORY — DX: Malignant neoplasm of liver, not specified as primary or secondary: C22.9

## 2018-01-13 LAB — COMPREHENSIVE METABOLIC PANEL
ALT: 75 U/L — AB (ref 14–54)
ANION GAP: 9 (ref 5–15)
AST: 45 U/L — ABNORMAL HIGH (ref 15–41)
Albumin: 4 g/dL (ref 3.5–5.0)
Alkaline Phosphatase: 133 U/L — ABNORMAL HIGH (ref 38–126)
BUN: 13 mg/dL (ref 6–20)
CHLORIDE: 102 mmol/L (ref 101–111)
CO2: 30 mmol/L (ref 22–32)
Calcium: 9.1 mg/dL (ref 8.9–10.3)
Creatinine, Ser: 0.96 mg/dL (ref 0.44–1.00)
GFR calc non Af Amer: >60 mL/min (ref >60)
Glucose, Bld: 133 mg/dL — ABNORMAL HIGH (ref 65–99)
Potassium: 3.7 mmol/L (ref 3.5–5.1)
SODIUM: 141 mmol/L (ref 135–145)
Total Bilirubin: 0.5 mg/dL (ref 0.3–1.2)
Total Protein: 8 g/dL (ref 6.5–8.1)

## 2018-01-13 LAB — LIPASE, BLOOD: Lipase: 32 U/L (ref 11–51)

## 2018-01-13 LAB — CBC WITH DIFFERENTIAL/PLATELET
Basophils Absolute: 0.1 10*3/uL (ref 0.0–0.1)
Basophils Relative: 1 %
Eosinophils Absolute: 0.9 10*3/uL — ABNORMAL HIGH (ref 0.0–0.7)
Eosinophils Relative: 7 %
HEMATOCRIT: 42.6 % (ref 36.0–46.0)
HEMOGLOBIN: 14.5 g/dL (ref 12.0–15.0)
LYMPHS ABS: 4.2 10*3/uL — AB (ref 0.7–4.0)
Lymphocytes Relative: 33 %
MCH: 29.7 pg (ref 26.0–34.0)
MCHC: 34 g/dL (ref 30.0–36.0)
MCV: 87.1 fL (ref 78.0–100.0)
MONOS PCT: 6 %
Monocytes Absolute: 0.7 10*3/uL (ref 0.1–1.0)
NEUTROS ABS: 7 10*3/uL (ref 1.7–7.7)
NEUTROS PCT: 53 %
Platelets: 279 10*3/uL (ref 150–400)
RBC: 4.89 MIL/uL (ref 3.87–5.11)
RDW: 13.1 % (ref 11.5–15.5)
WBC: 13 10*3/uL — ABNORMAL HIGH (ref 4.0–10.5)

## 2018-01-13 LAB — URINALYSIS, ROUTINE W REFLEX MICROSCOPIC
BILIRUBIN URINE: NEGATIVE
Glucose, UA: NEGATIVE mg/dL
Ketones, ur: NEGATIVE mg/dL
Nitrite: NEGATIVE
PH: 5.5 (ref 5.0–8.0)
Protein, ur: NEGATIVE mg/dL
SPECIFIC GRAVITY, URINE: 1.025 (ref 1.005–1.030)

## 2018-01-13 LAB — URINALYSIS, MICROSCOPIC (REFLEX)

## 2018-01-13 LAB — I-STAT CG4 LACTIC ACID, ED: LACTIC ACID, VENOUS: 1.65 mmol/L (ref 0.5–1.9)

## 2018-01-13 LAB — PREGNANCY, URINE: PREG TEST UR: NEGATIVE

## 2018-01-13 MED ORDER — IOPAMIDOL (ISOVUE-300) INJECTION 61%
100.0000 mL | Freq: Once | INTRAVENOUS | Status: AC | PRN
  Administered 2018-01-13: 100 mL via INTRAVENOUS

## 2018-01-13 MED ORDER — FENTANYL CITRATE (PF) 100 MCG/2ML IJ SOLN
100.0000 ug | Freq: Once | INTRAMUSCULAR | Status: AC
  Administered 2018-01-13: 100 ug via INTRAVENOUS
  Filled 2018-01-13: qty 2

## 2018-01-13 MED ORDER — CEPHALEXIN 500 MG PO CAPS: 500.0000 mg | ORAL_CAPSULE | Freq: Two times a day (BID) | ORAL | 0 refills | Status: AC

## 2018-01-13 NOTE — ED Notes (Signed)
ED Provider at bedside. 

## 2018-01-13 NOTE — Discharge Instructions (Addendum)
If your fever continues or worsens, or you develop worsening abdominal pain or if you develop vomiting or other new/concerning symptoms and return to the ER for evaluation.  Otherwise follow-up closely with your primary care physician and take the antibiotics as instructed.

## 2018-01-13 NOTE — ED Triage Notes (Signed)
Temperature of 103 this morning at 5:30 am, took Tylenol with good result. Chronic RUQ pain d/t liver cancer per patient.

## 2018-01-13 NOTE — ED Provider Notes (Signed)
[facility] Provider Note   CSN: [id number] Arrival date & time: 01/13/18  0850     History   Chief Complaint Chief Complaint  Patient presents with  . Abdominal Pain  . Fever    HPI COUNTESS BIEBEL is a 37 y.o. female.  HPI  37 year old female with a history of hepatocellular carcinoma being followed at [facility] presents with fever.  On June 4 she had a TACE procedure at [facility] (transarterial chemoembolization).  She has not felt great since then and has been having some on and off headaches.  However this morning she woke up with a fever.  It was 103.  She took Tylenol and it seemed to get better but then came back.  She denies any new or worsening headache, cough, shortness of breath, chest pain.  Her abdominal pain seems a little bit worse.  She feels like she is distended which she is not sure if this is from IBS or not which she has a "severe case".  She has chronic diarrhea that is unchanged.  The pain in her abdomen is worse and is worse with inspiration going to her right shoulder.  No urinary symptoms.  No rash.  She does not have any indwelling lines.  Past Medical History:  Diagnosis Date  . Allergy   . Anxiety   . Aortic regurgitation due to bicuspid aortic valve   . Arthritis   . Borderline diabetes 10/20/2016  . Chronic kidney disease    kidney stones  . Depression   . Diabetes type 2, controlled ([place]) 10/20/2016  . Family history of breast cancer in female   . Family history of colon cancer   . Fatty liver 11/28/2016  . GERD (gastroesophageal reflux disease)   . Heart murmur   . Hypertension   . IBS (irritable bowel syndrome)   . Insomnia   . Liver cancer ([place]) 03/2017  . Migraine headache   . NASH (nonalcoholic steatohepatitis) 04/04/2017   Per biopsy 9/18  . OCD (obsessive compulsive disorder)   . Orbital cellulitis    at age 54 was hospitalized for 3 months (almost died)   . OSA (obstructive sleep apnea)   . S/P appy 05/2013  .  Sleep apnea     Patient Active Problem List   Diagnosis Date Noted  . Vitamin D deficiency 11/01/2017  . Black stools 09/13/2017  . Liver lesion 06/20/2017  . Diarrhea 06/20/2017  . NASH (nonalcoholic steatohepatitis) 04/04/2017  . Iron deficiency anemia due to chronic blood loss 03/21/2017  . Genetic testing 02/28/2017  . Family history of breast cancer in female   . Family history of colon cancer   . Elevated LFTs 01/11/2017  . RUQ abdominal pain 01/11/2017  . PTSD (post-traumatic stress disorder) 12/28/2016  . Primary osteoarthritis of both knees 12/28/2016  . ANA positive 12/28/2016  . Leukocytosis 12/06/2016  . Fatty liver 11/28/2016  . History of kidney stones 10/21/2016  . Osteoarthritis 10/21/2016  . Diabetes type 2, controlled ([place]) 10/20/2016  . Cough variant asthma vs uacs  07/07/2015  . Severe obesity (BMI >= 40) ([place]) 07/07/2015  . Dyspnea 07/07/2015  . Asthma 05/27/2015  . Aortic regurgitation due to bicuspid aortic valve 03/12/2014  . Personal history of tobacco use, presenting hazards to health 01/20/2014  . Anxiety state 10/16/2013  . Migraine 07/08/2013  . Sleep apnea 07/08/2013  . IBS (irritable bowel syndrome) 06/06/2013  . GERD (gastroesophageal reflux disease) 06/06/2013  . HTN (hypertension) 06/06/2013  Past Surgical History:  Procedure Laterality Date  . APPENDECTOMY    . COLONOSCOPY    . DENTAL SURGERY     wisdom teeth  . ESOPHAGOGASTRODUODENOSCOPY    . LAPAROSCOPIC APPENDECTOMY N/A 06/06/2013   Procedure: APPENDECTOMY LAPAROSCOPIC;  Surgeon: Imogene Burn. Georgette Dover, MD;  Location: [facility];  Service: General;  Laterality: N/A;  . LIVER BIOPSY       OB History   None      Home Medications    Prior to Admission medications   Medication Sig Start Date End Date Taking? Authorizing Provider  cetirizine (ZYRTEC) 10 MG tablet Take 10 mg by mouth daily.   Yes [provider]  dicyclomine (BENTYL) 20 MG tablet TAKE ONE TABLET BY MOUTH FOUR  TIMES A DAY BEFORE MEALS AND AT BEDTIME 11/04/17  Yes Debbrah Alar, NP  escitalopram (LEXAPRO) 20 MG tablet TAKE ONE TABLET BY MOUTH DAILY 01/02/18  Yes Debbrah Alar, NP  fluticasone (FLONASE) 50 MCG/ACT nasal spray Place 2 sprays into both nostrils daily as needed for allergies.    Yes [provider]  furosemide (LASIX) 40 MG tablet TAKE ONE TABLET BY MOUTH EVERY MORNING 10/04/17  Yes Debbrah Alar, NP  losartan (COZAAR) 100 MG tablet TAKE ONE TABLET BY MOUTH DAILY 09/04/17  Yes Debbrah Alar, NP  metoprolol succinate (TOPROL-XL) 100 MG 24 hr tablet TAKE ONE TABLET BY MOUTH DAILY WITH OR IMMEDIATELY FOLLOWING A MEAL 11/29/17  Yes Debbrah Alar, NP  mirtazapine (REMERON) 15 MG tablet TAKE ONE TABLET BY MOUTH EVERY NIGHT AT BEDTIME 01/02/18  Yes Debbrah Alar, NP  montelukast (SINGULAIR) 10 MG tablet TAKE ONE TABLET BY MOUTH AT BEDTIME 11/29/17  Yes Debbrah Alar, NP  Multiple Vitamins-Minerals (MULTIVITAMIN WITH MINERALS) tablet Take 1 tablet by mouth daily.   Yes [provider]  omeprazole (PRILOSEC) 20 MG capsule TAKE 2 CAPSULES BY MOUTH EVERY MORNING BEFORE BREAKFAST AND 1 CAPSULE EVERY EVENING BEFORE SUPPER 06/23/17  Yes Debbrah Alar, NP  SYMBICORT 160-4.5 MCG/ACT inhaler INHALE TWO PUFFS BY MOUTH TWICE A DAY 12/01/17  Yes Debbrah Alar, NP  Vitamin D, Ergocalciferol, 2000 units CAPS Take 2,000 Units by mouth daily.   Yes [provider]  ACCU-CHEK FASTCLIX LANCETS MISC Check sugar twice daily 08/21/17   Debbrah Alar, NP  ACCU-CHEK SOFTCLIX LANCETS lancets Use as instructed 01/09/17   Debbrah Alar, NP  azithromycin (ZITHROMAX) 250 MG tablet 2 tabs by mouth today, then one tab daily for 4 more days. 12/12/17   Debbrah Alar, NP  benzonatate (TESSALON) 100 MG capsule Take 1 capsule (100 mg total) by mouth 3 (three) times daily as needed. 12/07/17   Shelda Pal, DO  Blood Glucose Monitoring Suppl  (ACCU-CHEK GUIDE) w/Device KIT 1 each by Does not apply route daily. 01/09/17   Debbrah Alar, NP  cephALEXin (KEFLEX) 500 MG capsule Take 1 capsule (500 mg total) by mouth 2 (two) times daily for 7 days. 01/13/18 01/20/18  Sherwood Gambler, MD  glucose blood (ACCU-CHEK GUIDE) test strip Use as instructed to check blood sugar twice day. 08/21/17   Debbrah Alar, NP  meloxicam (MOBIC) 15 MG tablet TAKE ONE TABLET BY MOUTH DAILY FOR 2 WEEKS THEN AS NEEDED WITH FOOD Patient taking differently: Use sparingly as needed. 11/04/17   Debbrah Alar, NP  metFORMIN (GLUCOPHAGE-XR) 500 MG 24 hr tablet TAKE TWO TABLETS BY MOUTH DAILY WITH BREAKFAST 11/29/17   Debbrah Alar, NP  ondansetron (ZOFRAN ODT) 4 MG disintegrating tablet Take 1 tablet (4 mg total) every  8 (eight) hours as needed by mouth for nausea or vomiting. 06/12/17   McDonald, Mia A, PA-C  potassium chloride (K-DUR,KLOR-CON) 10 MEQ tablet TAKE ONE TABLET BY MOUTH EVERY OTHER DAY 01/02/18   Debbrah Alar, NP  SUMAtriptan (IMITREX) 50 MG tablet Take 1 tablet (50 mg total) by mouth every 2 (two) hours as needed for migraine. May repeat in 2 hours if headache persists or recurs. 10/17/16   Debbrah Alar, NP    Family History Family History  Problem Relation Age of Onset  . Asthma Mother   . Hypertension Mother   . Breast cancer Father 46  . Diabetes Father   . Hypertension Father   . Heart disease Father   . Hyperlipidemia Father   . Asthma Sister   . Hyperlipidemia Brother   . Hypertension Brother   . Vision loss Brother   . Colon cancer Maternal Grandmother 39  . Liver cancer Maternal Grandmother   . Cervical cancer Maternal Aunt   . Stroke Paternal Uncle 67  . Melanoma Maternal Grandfather 40  . Colon polyps Maternal Grandfather   . AAA (abdominal aortic aneurysm) Paternal Grandmother   . Heart disease Paternal Grandfather   . Heart disease Paternal Uncle 76  . Colon cancer Other 102       MGM's mother  .  Stomach cancer Other 104       MGM's mother  . Breast cancer Other        MGM's maternal aunt  . Cancer Other        MGM's maternal uncle with GI cancer  . Esophageal cancer Neg Hx   . Pancreatic cancer Neg Hx   . Rectal cancer Neg Hx     Social History Social History   Tobacco Use  . Smoking status: Former Smoker    Packs/day: 1.00    Years: 20.00    Pack years: 20.00    Types: Cigarettes    Last attempt to quit: 02/22/2013    Years since quitting: 4.8  . Smokeless tobacco: Never Used  Substance Use Topics  . Alcohol use: Yes    Alcohol/week: 0.0 oz    Comment: social  . Drug use: No     Allergies   Codeine; Amlodipine; Dust mite extract; Mold extract [trichophyton]; and Tape   Review of Systems Review of Systems  Constitutional: Positive for fever.  HENT: Negative for sore throat.   Respiratory: Negative for cough and shortness of breath.   Cardiovascular: Negative for chest pain.  Gastrointestinal: Positive for abdominal pain. Negative for vomiting.  Genitourinary: Negative for dysuria.  Musculoskeletal: Negative for back pain.  Neurological: Positive for headaches.  All other systems reviewed and are negative.    Physical Exam Updated Vital Signs BP 114/69   Pulse 71   Temp 98.4 F (36.9 C) (Oral)   Resp 12   Ht 5' 6"  (1.676 m)   Wt 122.6 kg (270 lb 4.5 oz)   LMP 01/06/2018   SpO2 95%   BMI 43.62 kg/m   Physical Exam  Constitutional: She is oriented to person, place, and time. She appears well-developed and well-nourished.  obese  HENT:  Head: Normocephalic and atraumatic.  Right Ear: External ear normal.  Left Ear: External ear normal.  Nose: Nose normal.  Eyes: Right eye exhibits no discharge. Left eye exhibits no discharge.  Cardiovascular: Regular rhythm and normal heart sounds. Tachycardia present.  Pulmonary/Chest: Effort normal and breath sounds normal. She has no wheezes. She has no rales.  Abdominal: Soft. There is tenderness  (diffuse, worst in RUQ).  Neurological: She is alert and oriented to person, place, and time.  Skin: Skin is warm and dry.  Nursing note and vitals reviewed.    ED Treatments / Results  Labs (all labs ordered are listed, but only abnormal results are displayed) Labs Reviewed  COMPREHENSIVE METABOLIC PANEL - Abnormal; Notable for the following components:      Result Value   Glucose, Bld 133 (*)    AST 45 (*)    ALT 75 (*)    Alkaline Phosphatase 133 (*)    All other components within normal limits  CBC WITH DIFFERENTIAL/PLATELET - Abnormal; Notable for the following components:   WBC 13.0 (*)    Lymphs Abs 4.2 (*)    Eosinophils Absolute 0.9 (*)    All other components within normal limits  URINALYSIS, ROUTINE W REFLEX MICROSCOPIC - Abnormal; Notable for the following components:   Hgb urine dipstick SMALL (*)    Leukocytes, UA TRACE (*)    All other components within normal limits  URINALYSIS, MICROSCOPIC (REFLEX) - Abnormal; Notable for the following components:   Bacteria, UA MANY (*)    All other components within normal limits  CULTURE, BLOOD (ROUTINE X 2)  CULTURE, BLOOD (ROUTINE X 2)  URINE CULTURE  LIPASE, BLOOD  PREGNANCY, URINE  I-STAT CG4 LACTIC ACID, ED    EKG EKG Interpretation  Date/Time:  Saturday January 13 2018 09:58:56 EDT Ventricular Rate:  87 PR Interval:    QRS Duration: 108 QT Interval:  368 QTC Calculation: 443 R Axis:   6 Text Interpretation:  Normal sinus rhythm Low voltage, precordial leads Abnormal R-wave progression, early transition no acute ST/T changes no significant change since July 2017 Confirmed by Sherwood Gambler [id number]) on 01/13/2018 10:16:38 AM   Radiology Dg Chest 2 View  Result Date: 01/13/2018 CLINICAL DATA:  37 year old female former smoker with history of fever to 103 degrees. EXAM: CHEST - 2 VIEW COMPARISON:  Chest x-ray 03/04/2017. FINDINGS: Lung volumes are normal. No consolidative airspace disease. No pleural effusions.  No pneumothorax. No pulmonary nodule or mass noted. Pulmonary vasculature and the cardiomediastinal silhouette are within normal limits. IMPRESSION: No radiographic evidence of acute cardiopulmonary disease. Electronically Signed   By: Vinnie Langton M.D.   On: 01/13/2018 11:14   Ct Abdomen Pelvis W Contrast  Result Date: 01/13/2018 CLINICAL DATA:  History of liver cancer. Fever with right upper quadrant pain this morning. Trans arterial chemoembolization of the liver 01/10/2018. EXAM: CT ABDOMEN AND PELVIS WITH CONTRAST TECHNIQUE: Multidetector CT imaging of the abdomen and pelvis was performed using the standard protocol following bolus administration of intravenous contrast. CONTRAST:  128m ISOVUE-300 IOPAMIDOL (ISOVUE-300) INJECTION 61% COMPARISON:  03/04/2017 FINDINGS: Lower chest: Normal. Hepatobiliary: Gallbladder and biliary tree are normal. Mild peripheral high-density material likely intravascular over the anterior segment of the right lobe of the liver as well as medial segment left lobe towards the dome likely due to patient's recent chemoembolization procedure. Pancreas: Normal. Spleen: Normal. Adrenals/Urinary Tract: Adrenal glands are normal. Kidneys are normal in size without hydronephrosis or nephrolithiasis. 1 cm hypodensity over the left kidney likely a cyst but too small to characterize and unchanged. Ureters and bladder are normal. Stomach/Bowel: Stomach and small bowel are normal. Previous appendectomy. Colon is somewhat decompressed and otherwise within normal. Vascular/Lymphatic: Vascular structures are normal. Few small stable porta hepatis/peripancreatic and periaortic lymph nodes. Reproductive: Normal. Other: No free fluid or focal inflammatory change. Musculoskeletal: Minimal degenerate  change of the spine. IMPRESSION: No acute findings in the abdomen/pelvis. Changes over the dome of the liver compatible recent trans arterial chemoembolization. 1 cm left renal hypodensity unchanged  too small to characterize but likely a cyst. Electronically Signed   By: Marin Olp M.D.   On: 01/13/2018 11:04   Mm Screening Breast Tomo Bilateral  Result Date: 01/12/2018 CLINICAL DATA:  Screening. EXAM: DIGITAL SCREENING BILATERAL MAMMOGRAM WITH TOMO AND CAD COMPARISON:  Previous exam(s). ACR Breast Density Category b: There are scattered areas of fibroglandular density. FINDINGS: In the right breast, a possible asymmetry warrants further evaluation. In the left breast, no findings suspicious for malignancy. Images were processed with CAD. IMPRESSION: Further evaluation is suggested for possible asymmetry in the right breast. RECOMMENDATION: Diagnostic mammogram and possibly ultrasound of the right breast. (Code:[id number]) The patient will be contacted regarding the findings, and additional imaging will be scheduled. BI-RADS CATEGORY  0: Incomplete. Need additional imaging evaluation and/or prior mammograms for comparison. Electronically Signed   By: Kristopher Oppenheim M.D.   On: 01/12/2018 13:49    Procedures Procedures (including critical care time)  Medications Ordered in ED Medications  fentaNYL (SUBLIMAZE) injection 100 mcg (100 mcg Intravenous Given 01/13/18 1030)  iopamidol (ISOVUE-300) 61 % injection 100 mL (100 mLs Intravenous Contrast Given 01/13/18 1041)     Initial Impression / Assessment and Plan / ED Course  I have reviewed the triage vital signs and the nursing notes.  Pertinent labs & imaging results that were available during my care of the patient were reviewed by me and considered in my medical decision making (see chart for details).     The patient is well-appearing.  She does have diffuse abdominal tenderness but this sounds like it is in a chronic problem as well.  She has not had a fever here.  Mild elevation in her WBC.  CT does not show any obvious postop complications.  Only possible infectious source I can see on this work-up and exam is 6-10 WBC and urine as well  as many bacteria.  She does not specifically have urine symptoms.  I discussed with oncology at [facility], Dr. Yves Dill, who cannot think of any other obvious acute complications watch for him the procedure and otherwise the patient should be able to follow-up with her PCP.  Nothing else specific relating to her cancer.  She has not been given systemic chemotherapy and so I do not think she is at risk for more indolent infections.  I will cover for the UTI with Keflex and have her follow-up with her PCP.  We discussed return precautions.  Final Clinical Impressions(s) / ED Diagnoses   Final diagnoses:  Fever in adult  Acute UTI    ED Discharge Orders        Ordered    cephALEXin (KEFLEX) 500 MG capsule  2 times daily     01/13/18 1223       Sherwood Gambler, MD 01/13/18 1523

## 2018-01-14 ENCOUNTER — Ambulatory Visit
Admission: RE | Admit: 2018-01-14 | Discharge: 2018-01-14 | Disposition: A | Discharge status: Home / Self Care | Payer: Managed Care, Other (non HMO) | Source: Ambulatory Visit | Attending: Family | Admitting: Family

## 2018-01-14 DIAGNOSIS — Z1231 Encounter for screening mammogram for malignant neoplasm of breast: Secondary | ICD-10-CM

## 2018-01-14 LAB — URINE CULTURE: Culture: NO GROWTH

## 2018-01-14 MED ORDER — GADOBENATE DIMEGLUMINE 529 MG/ML IV SOLN
20.0000 mL | Freq: Once | INTRAVENOUS | Status: AC | PRN
  Administered 2018-01-14: 20 mL via INTRAVENOUS

## 2018-01-15 ENCOUNTER — Encounter: Payer: Self-pay | Admitting: Family

## 2018-01-15 ENCOUNTER — Other Ambulatory Visit: Payer: Self-pay | Admitting: Family

## 2018-01-15 DIAGNOSIS — R928 Other abnormal and inconclusive findings on diagnostic imaging of breast: Secondary | ICD-10-CM

## 2018-01-16 ENCOUNTER — Telehealth: Payer: Self-pay | Admitting: *Deleted

## 2018-01-16 NOTE — Telephone Encounter (Signed)
Received Physician Orders from [place]; forwarded to provider/SLS 06/25

## 2018-01-17 ENCOUNTER — Ambulatory Visit
Admission: RE | Admit: 2018-01-17 | Discharge: 2018-01-17 | Disposition: A | Discharge status: Home / Self Care | Payer: Managed Care, Other (non HMO) | Source: Ambulatory Visit | Attending: Family | Admitting: Family

## 2018-01-17 ENCOUNTER — Ambulatory Visit: Payer: Managed Care, Other (non HMO)

## 2018-01-17 DIAGNOSIS — R928 Other abnormal and inconclusive findings on diagnostic imaging of breast: Secondary | ICD-10-CM

## 2018-01-18 LAB — CULTURE, BLOOD (ROUTINE X 2)
Culture: NO GROWTH
Culture: NO GROWTH
Special Requests: ADEQUATE

## 2018-01-22 ENCOUNTER — Emergency Department (HOSPITAL_BASED_OUTPATIENT_CLINIC_OR_DEPARTMENT_OTHER): Payer: Managed Care, Other (non HMO)

## 2018-01-22 ENCOUNTER — Other Ambulatory Visit: Payer: Self-pay

## 2018-01-22 ENCOUNTER — Emergency Department (HOSPITAL_BASED_OUTPATIENT_CLINIC_OR_DEPARTMENT_OTHER)
Admission: EM | Admit: 2018-01-22 | Discharge: 2018-01-22 | Disposition: A | Discharge status: Home / Self Care | Payer: Managed Care, Other (non HMO) | Attending: Emergency Medicine | Admitting: Emergency Medicine

## 2018-01-22 ENCOUNTER — Encounter (HOSPITAL_BASED_OUTPATIENT_CLINIC_OR_DEPARTMENT_OTHER): Payer: Self-pay | Admitting: *Deleted

## 2018-01-22 DIAGNOSIS — Z87891 Personal history of nicotine dependence: Secondary | ICD-10-CM | POA: Insufficient documentation

## 2018-01-22 DIAGNOSIS — W010XXA Fall on same level from slipping, tripping and stumbling without subsequent striking against object, initial encounter: Secondary | ICD-10-CM | POA: Insufficient documentation

## 2018-01-22 DIAGNOSIS — Y9373 Activity, racquet and hand sports: Secondary | ICD-10-CM | POA: Diagnosis not present

## 2018-01-22 DIAGNOSIS — S80211A Abrasion, right knee, initial encounter: Secondary | ICD-10-CM | POA: Diagnosis not present

## 2018-01-22 DIAGNOSIS — M25532 Pain in left wrist: Secondary | ICD-10-CM | POA: Insufficient documentation

## 2018-01-22 DIAGNOSIS — Y999 Unspecified external cause status: Secondary | ICD-10-CM | POA: Insufficient documentation

## 2018-01-22 DIAGNOSIS — M25511 Pain in right shoulder: Secondary | ICD-10-CM | POA: Insufficient documentation

## 2018-01-22 DIAGNOSIS — Y92312 Tennis court as the place of occurrence of the external cause: Secondary | ICD-10-CM | POA: Insufficient documentation

## 2018-01-22 DIAGNOSIS — C229 Malignant neoplasm of liver, not specified as primary or secondary: Secondary | ICD-10-CM | POA: Diagnosis not present

## 2018-01-22 DIAGNOSIS — E119 Type 2 diabetes mellitus without complications: Secondary | ICD-10-CM | POA: Diagnosis not present

## 2018-01-22 DIAGNOSIS — N189 Chronic kidney disease, unspecified: Secondary | ICD-10-CM | POA: Diagnosis not present

## 2018-01-22 DIAGNOSIS — Z7984 Long term (current) use of oral hypoglycemic drugs: Secondary | ICD-10-CM | POA: Insufficient documentation

## 2018-01-22 DIAGNOSIS — S80212A Abrasion, left knee, initial encounter: Secondary | ICD-10-CM | POA: Diagnosis not present

## 2018-01-22 DIAGNOSIS — I129 Hypertensive chronic kidney disease with stage 1 through stage 4 chronic kidney disease, or unspecified chronic kidney disease: Secondary | ICD-10-CM | POA: Insufficient documentation

## 2018-01-22 DIAGNOSIS — Z79899 Other long term (current) drug therapy: Secondary | ICD-10-CM | POA: Diagnosis not present

## 2018-01-22 DIAGNOSIS — S50311A Abrasion of right elbow, initial encounter: Secondary | ICD-10-CM | POA: Diagnosis not present

## 2018-01-22 DIAGNOSIS — W19XXXA Unspecified fall, initial encounter: Secondary | ICD-10-CM

## 2018-01-22 DIAGNOSIS — T07XXXA Unspecified multiple injuries, initial encounter: Secondary | ICD-10-CM

## 2018-01-22 DIAGNOSIS — T1490XA Injury, unspecified, initial encounter: Secondary | ICD-10-CM | POA: Diagnosis present

## 2018-01-22 MED ORDER — BACITRACIN ZINC 500 UNIT/GM EX OINT: TOPICAL_OINTMENT | Freq: Once | CUTANEOUS | Status: DC

## 2018-01-22 MED ORDER — METHOCARBAMOL 500 MG PO TABS: 500.0000 mg | ORAL_TABLET | Freq: Two times a day (BID) | ORAL | 0 refills | Status: DC

## 2018-01-22 MED ORDER — ACETAMINOPHEN 500 MG PO TABS
1000.0000 mg | ORAL_TABLET | Freq: Once | ORAL | Status: AC
  Administered 2018-01-22: 1000 mg via ORAL
  Filled 2018-01-22: qty 2

## 2018-01-22 NOTE — ED Triage Notes (Signed)
Fall tonight. States she has cancer and was told to be more active. She was playing tennis and fell. Injury to her right shoulder, right elbow and left wrist injury. Pain in both knees.

## 2018-01-22 NOTE — ED Provider Notes (Signed)
[facility] EMERGENCY DEPARTMENT Provider Note   CSN: [id number] Arrival date & time: 01/22/18  2100     History   Chief Complaint Chief Complaint  Patient presents with  . Fall    HPI Stephanie Miles is a 37 y.o. female.  Stephanie Miles is a 37 y.o. Female with a history of hepatocellular carcinoma, currently undergoing treatment, hypertension, diabetes, IBS, GERD, who presents to the emergency department for evaluation after mechanical fall.  Patient reports she was told by her oncologist recently that she needed to be more physically active, and so tonight went to try and play tennis while she was running for the ball she got tripped up and fell catching herself on her left wrist, right elbow and shoulder and both knees.  She reports superficial abrasions to bilateral knees, and that they are sore but has no difficulty with range of motion or weightbearing.  Pain is most severe in her right shoulder over the anterior joint space where there is a constant ache that is worse with range of motion or palpation.  She denies numbness or weakness in the right arm, has a large abrasion to the back of the right elbow but no difficulty with range of motion and no surrounding swelling or deformity.  Left wrist with superficial swelling and ecchymosis over the anterior aspect, no obvious deformity, able to move the hand and wrist with mild discomfort, reports some tingling in her first 3 fingers intermittently.  She did not hit her head, no loss of consciousness, denies any other injuries from the fall.  Tetanus is up-to-date.  No medications prior to arrival to treat pain, no other aggravating or alleviating factors.     Past Medical History:  Diagnosis Date  . Allergy   . Anxiety   . Aortic regurgitation due to bicuspid aortic valve   . Arthritis   . Borderline diabetes 10/20/2016  . Chronic kidney disease    kidney stones  . Depression   . Diabetes type 2, controlled ([place])  10/20/2016  . Family history of breast cancer in female   . Family history of colon cancer   . Fatty liver 11/28/2016  . GERD (gastroesophageal reflux disease)   . Heart murmur   . Hypertension   . IBS (irritable bowel syndrome)   . Insomnia   . Liver cancer ([place]) 03/2017  . Migraine headache   . NASH (nonalcoholic steatohepatitis) 04/04/2017   Per biopsy 9/18  . OCD (obsessive compulsive disorder)   . Orbital cellulitis    at age 38 was hospitalized for 3 months (almost died)   . OSA (obstructive sleep apnea)   . S/P appy 05/2013  . Sleep apnea     Patient Active Problem List   Diagnosis Date Noted  . Vitamin D deficiency 11/01/2017  . Black stools 09/13/2017  . Liver lesion 06/20/2017  . Diarrhea 06/20/2017  . NASH (nonalcoholic steatohepatitis) 04/04/2017  . Iron deficiency anemia due to chronic blood loss 03/21/2017  . Genetic testing 02/28/2017  . Family history of breast cancer in female   . Family history of colon cancer   . Elevated LFTs 01/11/2017  . RUQ abdominal pain 01/11/2017  . PTSD (post-traumatic stress disorder) 12/28/2016  . Primary osteoarthritis of both knees 12/28/2016  . ANA positive 12/28/2016  . Leukocytosis 12/06/2016  . Fatty liver 11/28/2016  . History of kidney stones 10/21/2016  . Osteoarthritis 10/21/2016  . Diabetes type 2, controlled ([place]) 10/20/2016  . Cough variant  asthma vs uacs  07/07/2015  . Severe obesity (BMI >= 40) ([place]) 07/07/2015  . Dyspnea 07/07/2015  . Asthma 05/27/2015  . Aortic regurgitation due to bicuspid aortic valve 03/12/2014  . Personal history of tobacco use, presenting hazards to health 01/20/2014  . Anxiety state 10/16/2013  . Migraine 07/08/2013  . Sleep apnea 07/08/2013  . IBS (irritable bowel syndrome) 06/06/2013  . GERD (gastroesophageal reflux disease) 06/06/2013  . HTN (hypertension) 06/06/2013    Past Surgical History:  Procedure Laterality Date  . APPENDECTOMY    . COLONOSCOPY    . DENTAL SURGERY      wisdom teeth  . ESOPHAGOGASTRODUODENOSCOPY    . LAPAROSCOPIC APPENDECTOMY N/A 06/06/2013   Procedure: APPENDECTOMY LAPAROSCOPIC;  Surgeon: Imogene Burn. Georgette Dover, MD;  Location: [facility];  Service: General;  Laterality: N/A;  . LIVER BIOPSY       OB History   None      Home Medications    Prior to Admission medications   Medication Sig Start Date End Date Taking? Authorizing Provider  DOXORUBICIN HCL IV by Does not apply route.   Yes [provider]  ACCU-CHEK FASTCLIX LANCETS MISC Check sugar twice daily 08/21/17   Debbrah Alar, NP  ACCU-CHEK SOFTCLIX LANCETS lancets Use as instructed 01/09/17   Debbrah Alar, NP  azithromycin (ZITHROMAX) 250 MG tablet 2 tabs by mouth today, then one tab daily for 4 more days. 12/12/17   Debbrah Alar, NP  benzonatate (TESSALON) 100 MG capsule Take 1 capsule (100 mg total) by mouth 3 (three) times daily as needed. 12/07/17   Shelda Pal, DO  Blood Glucose Monitoring Suppl (ACCU-CHEK GUIDE) w/Device KIT 1 each by Does not apply route daily. 01/09/17   Debbrah Alar, NP  cetirizine (ZYRTEC) 10 MG tablet Take 10 mg by mouth daily.    [provider]  dicyclomine (BENTYL) 20 MG tablet TAKE ONE TABLET BY MOUTH FOUR TIMES A DAY BEFORE MEALS AND AT BEDTIME 11/04/17   Debbrah Alar, NP  escitalopram (LEXAPRO) 20 MG tablet TAKE ONE TABLET BY MOUTH DAILY 01/02/18   Debbrah Alar, NP  fluticasone (FLONASE) 50 MCG/ACT nasal spray Place 2 sprays into both nostrils daily as needed for allergies.     [provider]  furosemide (LASIX) 40 MG tablet TAKE ONE TABLET BY MOUTH EVERY MORNING 10/04/17   Debbrah Alar, NP  glucose blood (ACCU-CHEK GUIDE) test strip Use as instructed to check blood sugar twice day. 08/21/17   Debbrah Alar, NP  losartan (COZAAR) 100 MG tablet TAKE ONE TABLET BY MOUTH DAILY 09/04/17   Debbrah Alar, NP  meloxicam (MOBIC) 15 MG tablet TAKE ONE TABLET BY MOUTH DAILY FOR  2 WEEKS THEN AS NEEDED WITH FOOD Patient taking differently: Use sparingly as needed. 11/04/17   Debbrah Alar, NP  metFORMIN (GLUCOPHAGE-XR) 500 MG 24 hr tablet TAKE TWO TABLETS BY MOUTH DAILY WITH BREAKFAST 11/29/17   Debbrah Alar, NP  metoprolol succinate (TOPROL-XL) 100 MG 24 hr tablet TAKE ONE TABLET BY MOUTH DAILY WITH OR IMMEDIATELY FOLLOWING A MEAL 11/29/17   Debbrah Alar, NP  mirtazapine (REMERON) 15 MG tablet TAKE ONE TABLET BY MOUTH EVERY NIGHT AT BEDTIME 01/02/18   Debbrah Alar, NP  montelukast (SINGULAIR) 10 MG tablet TAKE ONE TABLET BY MOUTH AT BEDTIME 11/29/17   Debbrah Alar, NP  Multiple Vitamins-Minerals (MULTIVITAMIN WITH MINERALS) tablet Take 1 tablet by mouth daily.    [provider]  omeprazole (PRILOSEC) 20 MG capsule TAKE 2 CAPSULES BY MOUTH EVERY MORNING  BEFORE BREAKFAST AND 1 CAPSULE EVERY EVENING BEFORE SUPPER 06/23/17   Debbrah Alar, NP  ondansetron (ZOFRAN ODT) 4 MG disintegrating tablet Take 1 tablet (4 mg total) every 8 (eight) hours as needed by mouth for nausea or vomiting. 06/12/17   McDonald, Mia A, PA-C  potassium chloride (K-DUR,KLOR-CON) 10 MEQ tablet TAKE ONE TABLET BY MOUTH EVERY OTHER DAY 01/02/18   Debbrah Alar, NP  SUMAtriptan (IMITREX) 50 MG tablet Take 1 tablet (50 mg total) by mouth every 2 (two) hours as needed for migraine. May repeat in 2 hours if headache persists or recurs. 10/17/16   Debbrah Alar, NP  SYMBICORT 160-4.5 MCG/ACT inhaler INHALE TWO PUFFS BY MOUTH TWICE A DAY 12/01/17   Debbrah Alar, NP  Vitamin D, Ergocalciferol, 2000 units CAPS Take 2,000 Units by mouth daily.    [provider]    Family History Family History  Problem Relation Age of Onset  . Asthma Mother   . Hypertension Mother   . Breast cancer Father 83  . Diabetes Father   . Hypertension Father   . Heart disease Father   . Hyperlipidemia Father   . Asthma Sister   . Hyperlipidemia Brother   .  Hypertension Brother   . Vision loss Brother   . Colon cancer Maternal Grandmother 48  . Liver cancer Maternal Grandmother   . Cervical cancer Maternal Aunt   . Stroke Paternal Uncle 39  . Melanoma Maternal Grandfather 37  . Colon polyps Maternal Grandfather   . AAA (abdominal aortic aneurysm) Paternal Grandmother   . Heart disease Paternal Grandfather   . Heart disease Paternal Uncle 5  . Colon cancer Other 19       MGM's mother  . Stomach cancer Other 37       MGM's mother  . Breast cancer Other        MGM's maternal aunt  . Cancer Other        MGM's maternal uncle with GI cancer  . Esophageal cancer Neg Hx   . Pancreatic cancer Neg Hx   . Rectal cancer Neg Hx     Social History Social History   Tobacco Use  . Smoking status: Former Smoker    Packs/day: 1.00    Years: 20.00    Pack years: 20.00    Types: Cigarettes    Last attempt to quit: 02/22/2013    Years since quitting: 4.9  . Smokeless tobacco: Never Used  Substance Use Topics  . Alcohol use: Yes    Alcohol/week: 0.0 oz    Comment: social  . Drug use: No     Allergies   Codeine; Amlodipine; Dust mite extract; Mold extract [trichophyton]; and Tape   Review of Systems Review of Systems  Constitutional: Negative for chills and fever.  Eyes: Negative for visual disturbance.  Respiratory: Negative for shortness of breath.   Cardiovascular: Negative for chest pain.  Gastrointestinal: Negative for abdominal pain, nausea and vomiting.  Musculoskeletal: Positive for arthralgias and joint swelling.  Skin: Positive for wound. Negative for color change and rash.  Neurological: Negative for syncope and headaches.     Physical Exam Updated Vital Signs BP (!) 133/93   Pulse (!) 104   Temp 98.4 F (36.9 C) (Oral)   Resp 20   Ht 5' 6"  (1.676 m)   Wt 122.5 kg (270 lb)   LMP 01/06/2018   SpO2 98%   BMI 43.58 kg/m   Physical Exam  Constitutional: She appears well-developed and well-nourished. No  distress.  HENT:  Head: Normocephalic and atraumatic.  Mouth/Throat: Oropharynx is clear and moist.  Eyes: Right eye exhibits no discharge. Left eye exhibits no discharge.  Neck: Normal range of motion. Neck supple.  No midline C-spine tenderness  Cardiovascular: Normal rate, regular rhythm, normal heart sounds and intact distal pulses.  Pulmonary/Chest: Effort normal and breath sounds normal. No respiratory distress.  Abdominal: Soft. Bowel sounds are normal. She exhibits no distension. There is no tenderness.  Musculoskeletal:  Tenderness to palpation over the left wrist primarily over the anterior aspect, small ecchymosis noted over the carpal tunnel, no focal snuffbox tenderness, normal range of motion with mild discomfort, 2+ radial and ulnar pulses, able to wiggle all fingers, sensation intact Tenderness to palpation over the anterior right shoulder, no overlying swelling or erythema, no appreciable deformity, range of motion intact but limited by pain, normal range of motion of the right elbow and wrist, 2+ radial pulse, sensation intact. Superficial abrasion over the right elbow, no palpable deformity Superficial abrasions over bilateral knees and upper shin, normal range of motion of the knees, 2+ DP and TP pulses, sensation intact.  Neurological: She is alert. Coordination normal.  Skin: Skin is warm. Capillary refill takes less than 2 seconds. She is not diaphoretic.  Psychiatric: She has a normal mood and affect. Her behavior is normal.  Nursing note and vitals reviewed.    ED Treatments / Results  Labs (all labs ordered are listed, but only abnormal results are displayed) Labs Reviewed - No data to display  EKG None  Radiology Dg Shoulder Right  Result Date: 01/22/2018 CLINICAL DATA:  Right shoulder pain since a fall playing tennis today. Initial encounter. EXAM: RIGHT SHOULDER - 2+ VIEW COMPARISON:  None. FINDINGS: There is no evidence of fracture or dislocation. There  is no evidence of arthropathy or other focal bone abnormality. Soft tissues are unremarkable. IMPRESSION: Negative exam. Electronically Signed   By: Inge Rise M.D.   On: 01/22/2018 21:51   Dg Wrist Complete Left  Result Date: 01/22/2018 CLINICAL DATA:  Acute LEFT wrist pain following fall today. Initial encounter. EXAM: LEFT WRIST - COMPLETE 3+ VIEW COMPARISON:  None. FINDINGS: No acute fracture, subluxation or dislocation identified. Soft tissue swelling noted. No focal bony lesions are identified. IMPRESSION: Soft tissue swelling without acute bony abnormality. Electronically Signed   By: Margarette Canada M.D.   On: 01/22/2018 21:58    Procedures Procedures (including critical care time)  Medications Ordered in ED Medications  bacitracin ointment (has no administration in time range)  acetaminophen (TYLENOL) tablet 1,000 mg (1,000 mg Oral Given 01/22/18 2228)     Initial Impression / Assessment and Plan / ED Course  I have reviewed the triage vital signs and the nursing notes.  Pertinent labs & imaging results that were available during my care of the patient were reviewed by me and considered in my medical decision making (see chart for details).  Patient presents to the ED for evaluation after mechanical fall, pain primarily to the left wrist and right shoulder, superficial abrasions to both knees in the right elbow, all joints supple and movable, all extremities neurovascularly intact, no head injury.  X-rays of the left wrist and right shoulder reveal no evidence of fracture or bony abnormality.  Tetanus is up-to-date, superficial abrasions cleaned and dressings applied wrist splint applied to the left wrist, and sling for the right shoulder, instructed patient to remove braces and practice range of motion multiple times a day, will treat  with Tylenol and muscle relaxers, patient not candidate for NSAIDs.  Return precautions discussed, patient to follow-up with sports medicine if not  improving.  Patient expresses understanding and is in agreement with plan.  Final Clinical Impressions(s) / ED Diagnoses   Final diagnoses:  Fall, initial encounter  Acute pain of right shoulder  Left wrist pain  Multiple abrasions    ED Discharge Orders        Ordered    methocarbamol (ROBAXIN) 500 MG tablet  2 times daily     01/22/18 2223       Jacqlyn Larsen, PA-C 01/23/18 0018    Virgel Manifold, MD 01/29/18 [phone]

## 2018-01-22 NOTE — Discharge Instructions (Signed)
X-ray show no evidence of fracture.  Use Tylenol and Robaxin for pain vaccine can cause drowsiness do not take before driving.  Ice and elevate the areas as much as possible, you may use shoulder sling and wrist splint for support, make sure you are taking your shoulder out of the sling and doing range of motion exercises at least 3 times a day to prevent frozen shoulder.  If symptoms not improving please follow-up with Dr. Karlton Lemon.  Return to the emergency department if you have significantly worsening pain, redness or swelling over the joints, fevers or any other new or concerning symptoms.

## 2018-01-30 ENCOUNTER — Encounter: Payer: Self-pay | Admitting: Family Medicine

## 2018-01-30 ENCOUNTER — Ambulatory Visit (INDEPENDENT_AMBULATORY_CARE_PROVIDER_SITE_OTHER): Payer: Managed Care, Other (non HMO) | Admitting: Family Medicine

## 2018-01-30 DIAGNOSIS — S8992XA Unspecified injury of left lower leg, initial encounter: Secondary | ICD-10-CM

## 2018-01-30 DIAGNOSIS — S4991XA Unspecified injury of right shoulder and upper arm, initial encounter: Secondary | ICD-10-CM | POA: Diagnosis not present

## 2018-01-30 MED ORDER — DICLOFENAC SODIUM 1 % TD GEL: 4.0000 g | Freq: Four times a day (QID) | TRANSDERMAL | 2 refills | Status: DC

## 2018-01-30 MED ORDER — LIDOCAINE 5 % EX PTCH: 1.0000 | MEDICATED_PATCH | CUTANEOUS | 1 refills | Status: DC

## 2018-01-30 NOTE — Patient Instructions (Signed)
You have strained your right rotator cuff. Try to avoid painful activities (overhead activities, lifting with extended arm) as much as possible. Arm circles, swings, table slides 3 sets of 10 twice a day to help regain your motion. Wait on strengthening for now. Tylenol 542m 1-2 tabs three times a day. Lidocaine patches to help with pain. Voltaren gel up to 4 times a day for pain. Follow up with me in 2 weeks for reevaluation.

## 2018-01-31 ENCOUNTER — Telehealth: Payer: Self-pay | Admitting: Family

## 2018-01-31 ENCOUNTER — Telehealth: Payer: Self-pay

## 2018-01-31 ENCOUNTER — Ambulatory Visit (INDEPENDENT_AMBULATORY_CARE_PROVIDER_SITE_OTHER): Payer: Managed Care, Other (non HMO) | Admitting: Family

## 2018-01-31 VITALS — BP 145/70 | HR 73 | Temp 98.3°F | Resp 18 | Ht 66.0 in | Wt 275.4 lb

## 2018-01-31 DIAGNOSIS — L918 Other hypertrophic disorders of the skin: Secondary | ICD-10-CM

## 2018-01-31 DIAGNOSIS — C220 Liver cell carcinoma: Secondary | ICD-10-CM | POA: Diagnosis not present

## 2018-01-31 DIAGNOSIS — F329 Major depressive disorder, single episode, unspecified: Secondary | ICD-10-CM

## 2018-01-31 DIAGNOSIS — F32A Depression, unspecified: Secondary | ICD-10-CM

## 2018-01-31 DIAGNOSIS — E119 Type 2 diabetes mellitus without complications: Secondary | ICD-10-CM | POA: Diagnosis not present

## 2018-01-31 HISTORY — DX: Liver cell carcinoma: C22.0

## 2018-01-31 MED ORDER — ONDANSETRON 4 MG PO TBDP: 4.0000 mg | ORAL_TABLET | Freq: Three times a day (TID) | ORAL | 0 refills | Status: DC | PRN

## 2018-01-31 NOTE — Progress Notes (Signed)
Subjective:    Patient ID: Stephanie Miles, female    DOB: 10-Jun-1981, 37 y.o.   MRN: [id number]  HPI   DM2- following with endo.   Lab Results  Component Value Date   HGBA1C 7.4 (H) 08/15/2017    Skin tags- requests freezing of skin tags around her neck.    Liver cancer- had TACE procedure 12/26/17.  Has had some fatigue which is starting to improve.  Will have follow up MRI on 02/26/18 and sees oncology on 03/02/18.    Depression-has met with counselor 4 times and has follow up scheduled.      Review of Systems    see HPI  Past Medical History:  Diagnosis Date  . Allergy   . Anxiety   . Aortic regurgitation due to bicuspid aortic valve   . Arthritis   . Borderline diabetes 10/20/2016  . Chronic kidney disease    kidney stones  . Depression   . Diabetes type 2, controlled ([place]) 10/20/2016  . Family history of breast cancer in female   . Family history of colon cancer   . Fatty liver 11/28/2016  . GERD (gastroesophageal reflux disease)   . Heart murmur   . Hypertension   . IBS (irritable bowel syndrome)   . Insomnia   . Liver cancer ([place]) 03/2017  . Migraine headache   . NASH (nonalcoholic steatohepatitis) 04/04/2017   Per biopsy 9/18  . OCD (obsessive compulsive disorder)   . Orbital cellulitis    at age 68 was hospitalized for 3 months (almost died)   . OSA (obstructive sleep apnea)   . S/P appy 05/2013  . Sleep apnea      Social History   Socioeconomic History  . Marital status: Single    Spouse name: Not on file  . Number of children: 0  . Years of education: Not on file  . Highest education level: Not on file  Occupational History  . Occupation: Ship broker  . Occupation: Programmer, applications  Social Needs  . Financial resource strain: Not on file  . Food insecurity:    Worry: Not on file    Inability: Not on file  . Transportation needs:    Medical: Not on file    Non-medical: Not on file  Tobacco Use  . Smoking status: Former Smoker    Packs/day: 1.00    Years: 20.00    Pack years: 20.00    Types: Cigarettes    Last attempt to quit: 02/22/2013    Years since quitting: 4.9  . Smokeless tobacco: Never Used  Substance and Sexual Activity  . Alcohol use: Yes    Alcohol/week: 0.0 oz    Comment: social  . Drug use: No  . Sexual activity: Not on file  Lifestyle  . Physical activity:    Days per week: Not on file    Minutes per session: Not on file  . Stress: Not on file  Relationships  . Social connections:    Talks on phone: Not on file    Gets together: Not on file    Attends religious service: Not on file    Active member of club or organization: Not on file    Attends meetings of clubs or organizations: Not on file    Relationship status: Not on file  . Intimate partner violence:    Fear of current or ex partner: Not on file    Emotionally abused: Not on file    Physically abused: Not on  file    Forced sexual activity: Not on file  Other Topics Concern  . Not on file  Social History Narrative   Working at [employer] doing [employer] alone (has a Neurosurgeon)   Working on a degree at [employer], will plan to return fall 2018   Enjoys spending time with family.   Friends and family in [place]    Past Surgical History:  Procedure Laterality Date  . APPENDECTOMY    . COLONOSCOPY    . DENTAL SURGERY     wisdom teeth  . ESOPHAGOGASTRODUODENOSCOPY    . LAPAROSCOPIC APPENDECTOMY N/A 06/06/2013   Procedure: APPENDECTOMY LAPAROSCOPIC;  Surgeon: Imogene Burn. Georgette Dover, MD;  Location: [facility];  Service: General;  Laterality: N/A;  . LIVER BIOPSY      Family History  Problem Relation Age of Onset  . Asthma Mother   . Hypertension Mother   . Breast cancer Father 23  . Diabetes Father   . Hypertension Father   . Heart disease Father   . Hyperlipidemia Father   . Asthma Sister   . Hyperlipidemia Brother   . Hypertension Brother   . Vision loss Brother   . Colon cancer Maternal Grandmother 47  . Liver cancer Maternal  Grandmother   . Cervical cancer Maternal Aunt   . Stroke Paternal Uncle 38  . Melanoma Maternal Grandfather 25  . Colon polyps Maternal Grandfather   . AAA (abdominal aortic aneurysm) Paternal Grandmother   . Heart disease Paternal Grandfather   . Heart disease Paternal Uncle 75  . Colon cancer Other 38       MGM's mother  . Stomach cancer Other 24       MGM's mother  . Breast cancer Other        MGM's maternal aunt  . Cancer Other        MGM's maternal uncle with GI cancer  . Esophageal cancer Neg Hx   . Pancreatic cancer Neg Hx   . Rectal cancer Neg Hx     Allergies  Allergen Reactions  . Codeine Nausea Only    Needs antiemetic Needs antiemetic  . Amlodipine Other (See Comments)    EDEMA EDEMA  . Dust Mite Extract   . Mold Extract [Trichophyton]   . Tape Rash    Tapes, bandaids Tapes, bandaids    Current Outpatient Medications on File Prior to Visit  Medication Sig Dispense Refill  . ACCU-CHEK FASTCLIX LANCETS MISC Check sugar twice daily 102 each 3  . Blood Glucose Monitoring Suppl (ACCU-CHEK GUIDE) w/Device KIT 1 each by Does not apply route daily. 1 kit 0  . cetirizine (ZYRTEC) 10 MG tablet Take 10 mg by mouth daily.    . diclofenac sodium (VOLTAREN) 1 % GEL Apply 4 g topically 4 (four) times daily. 3 Tube 2  . dicyclomine (BENTYL) 20 MG tablet TAKE ONE TABLET BY MOUTH FOUR TIMES A DAY BEFORE MEALS AND AT BEDTIME 120 tablet 4  . DOXORUBICIN HCL IV by Does not apply route.    Marland Kitchen escitalopram (LEXAPRO) 20 MG tablet TAKE ONE TABLET BY MOUTH DAILY 30 tablet 5  . fluticasone (FLONASE) 50 MCG/ACT nasal spray Place 2 sprays into both nostrils daily as needed for allergies.     . furosemide (LASIX) 40 MG tablet TAKE ONE TABLET BY MOUTH EVERY MORNING 30 tablet 3  . glucose blood (ACCU-CHEK GUIDE) test strip Use as instructed to check blood sugar twice day. 100 each 1  .  lidocaine (LIDODERM) 5 % Place 1 patch onto the skin daily. Remove & Discard patch within 12 hours or as  directed by MD 30 patch 1  . losartan (COZAAR) 100 MG tablet TAKE ONE TABLET BY MOUTH DAILY 30 tablet 5  . metFORMIN (GLUCOPHAGE-XR) 500 MG 24 hr tablet TAKE TWO TABLETS BY MOUTH DAILY WITH BREAKFAST 60 tablet 4  . methocarbamol (ROBAXIN) 500 MG tablet Take 1 tablet (500 mg total) by mouth 2 (two) times daily. 20 tablet 0  . metoprolol succinate (TOPROL-XL) 100 MG 24 hr tablet TAKE ONE TABLET BY MOUTH DAILY WITH OR IMMEDIATELY FOLLOWING A MEAL 30 tablet 4  . mirtazapine (REMERON) 15 MG tablet TAKE ONE TABLET BY MOUTH EVERY NIGHT AT BEDTIME 30 tablet 5  . montelukast (SINGULAIR) 10 MG tablet TAKE ONE TABLET BY MOUTH AT BEDTIME 30 tablet 4  . Multiple Vitamins-Minerals (MULTIVITAMIN WITH MINERALS) tablet Take 1 tablet by mouth daily.    Marland Kitchen omeprazole (PRILOSEC) 20 MG capsule TAKE 2 CAPSULES BY MOUTH EVERY MORNING BEFORE BREAKFAST AND 1 CAPSULE EVERY EVENING BEFORE SUPPER 90 capsule 5  . potassium chloride (K-DUR,KLOR-CON) 10 MEQ tablet TAKE ONE TABLET BY MOUTH EVERY OTHER DAY 15 tablet 5  . SUMAtriptan (IMITREX) 50 MG tablet Take 1 tablet (50 mg total) by mouth every 2 (two) hours as needed for migraine. May repeat in 2 hours if headache persists or recurs. 10 tablet 0  . SYMBICORT 160-4.5 MCG/ACT inhaler INHALE TWO PUFFS BY MOUTH TWICE A DAY 1 Inhaler 2  . Vitamin D, Ergocalciferol, 2000 units CAPS Take 2,000 Units by mouth daily.     Current Facility-Administered Medications on File Prior to Visit  Medication Dose Route Frequency Provider Last Rate Last Dose  . 0.9 %  sodium chloride infusion  500 mL Intravenous Continuous Ladene Artist, MD        BP (!) 145/70 (BP Location: Left Arm, Cuff Size: Large) Comment: has not taken medication today  Pulse 73   Temp 98.3 F (36.8 C) (Oral)   Resp 18   Ht 5' 6"  (1.676 m)   Wt 275 lb 6.4 oz (124.9 kg)   LMP 01/06/2018   SpO2 98%   BMI 44.45 kg/m    Objective:   Physical Exam  Constitutional: She is oriented to person, place, and time. She  appears well-developed and well-nourished.  Eyes: No scleral icterus.  Cardiovascular: Normal rate, regular rhythm and normal heart sounds.  No murmur heard. Pulmonary/Chest: Effort normal and breath sounds normal. No respiratory distress. She has no wheezes.  Musculoskeletal: She exhibits no edema.  Neurological: She is alert and oriented to person, place, and time.  Skin:  Multiple skin tags around the base of her neck.   Psychiatric: She has a normal mood and affect. Her behavior is normal. Judgment and thought content normal.          Assessment & Plan:  DM2- management per endocrinlogy  Liver cancer- undergoing treatment at [facility].  Management per oncology.  Skin tags- approximately 8 skin tags frozen from base of neck using liquid nitrogen following verbal patient consent. Pt tolerated procedure well.   Depression- scored 14 on PHQ-9. Denies SI.  She reports that she is working now with a therapist and continues her lexapro. Advised pt to let me know if her symptoms worsen or if they do not improve in the coming months.  She verbalizes understanding.

## 2018-01-31 NOTE — Telephone Encounter (Signed)
PA approved.  FEOFHQ:[id number];PQDIYM:[id number];Review Type:Qty;Coverage Start Date:01/01/2018;Coverage End Date:01/31/2019

## 2018-01-31 NOTE — Telephone Encounter (Signed)
Routed to wrong office,  Please advise

## 2018-01-31 NOTE — Telephone Encounter (Signed)
Routed to [place], NP for PA re: zofran.

## 2018-01-31 NOTE — Telephone Encounter (Signed)
Copied from [place] [phone]. Topic: General - Other >> Jan 31, 2018  2:26 PM Boyd Kerbs wrote: Reason for CRM:  ondansetron (ZOFRAN ODT) 4 MG disintegrating tablet This is needing a PA   This has to go through [place] for PA [phone]

## 2018-01-31 NOTE — Telephone Encounter (Signed)
PA initiated via Covermymeds; KEY: [id number]. Awaiting determination.

## 2018-01-31 NOTE — Telephone Encounter (Signed)
Does the non ODT version require PA?  Can you please check with pharmacist. If not, then let's send regular zofran 66m please. If that also requires prior auth, then please sent [facility] the [facility].

## 2018-02-01 ENCOUNTER — Encounter: Payer: Self-pay | Admitting: Family Medicine

## 2018-02-01 DIAGNOSIS — S8992XA Unspecified injury of left lower leg, initial encounter: Secondary | ICD-10-CM | POA: Insufficient documentation

## 2018-02-01 DIAGNOSIS — S4991XD Unspecified injury of right shoulder and upper arm, subsequent encounter: Secondary | ICD-10-CM | POA: Insufficient documentation

## 2018-02-01 HISTORY — DX: Unspecified injury of left lower leg, initial encounter: S89.92XA

## 2018-02-01 HISTORY — DX: Unspecified injury of right shoulder and upper arm, subsequent encounter: S49.91XD

## 2018-02-01 NOTE — Progress Notes (Signed)
PCP: Debbrah Alar, NP  Subjective:   HPI: Patient is a 37 y.o. female here for right shoulder pain.  Patient reports on 7/1 she fell landing onto either her right hand or right shoulder. Then turned and landed onto left knee. No prior injuries to either area. Pain was up to 6/10 and sharp. Has tried icing initially and now heat. Taking robaxin once daily. Her left leg has started to bruise much more. Pain in right shoulder with attempting overhead motions. No numbness.  Past Medical History:  Diagnosis Date  . Allergy   . Anxiety   . Aortic regurgitation due to bicuspid aortic valve   . Arthritis   . Borderline diabetes 10/20/2016  . Chronic kidney disease    kidney stones  . Depression   . Diabetes type 2, controlled ([place]) 10/20/2016  . Family history of breast cancer in female   . Family history of colon cancer   . Fatty liver 11/28/2016  . GERD (gastroesophageal reflux disease)   . Heart murmur   . Hypertension   . IBS (irritable bowel syndrome)   . Insomnia   . Liver cancer ([place]) 03/2017  . Migraine headache   . NASH (nonalcoholic steatohepatitis) 04/04/2017   Per biopsy 9/18  . OCD (obsessive compulsive disorder)   . Orbital cellulitis    at age 56 was hospitalized for 3 months (almost died)   . OSA (obstructive sleep apnea)   . S/P appy 05/2013  . Sleep apnea     Current Outpatient Medications on File Prior to Visit  Medication Sig Dispense Refill  . ACCU-CHEK FASTCLIX LANCETS MISC Check sugar twice daily 102 each 3  . Blood Glucose Monitoring Suppl (ACCU-CHEK GUIDE) w/Device KIT 1 each by Does not apply route daily. 1 kit 0  . cetirizine (ZYRTEC) 10 MG tablet Take 10 mg by mouth daily.    Marland Kitchen dicyclomine (BENTYL) 20 MG tablet TAKE ONE TABLET BY MOUTH FOUR TIMES A DAY BEFORE MEALS AND AT BEDTIME 120 tablet 4  . DOXORUBICIN HCL IV by Does not apply route.    Marland Kitchen escitalopram (LEXAPRO) 20 MG tablet TAKE ONE TABLET BY MOUTH DAILY 30 tablet 5  . fluticasone  (FLONASE) 50 MCG/ACT nasal spray Place 2 sprays into both nostrils daily as needed for allergies.     . furosemide (LASIX) 40 MG tablet TAKE ONE TABLET BY MOUTH EVERY MORNING 30 tablet 3  . glucose blood (ACCU-CHEK GUIDE) test strip Use as instructed to check blood sugar twice day. 100 each 1  . losartan (COZAAR) 100 MG tablet TAKE ONE TABLET BY MOUTH DAILY 30 tablet 5  . metFORMIN (GLUCOPHAGE-XR) 500 MG 24 hr tablet TAKE TWO TABLETS BY MOUTH DAILY WITH BREAKFAST 60 tablet 4  . methocarbamol (ROBAXIN) 500 MG tablet Take 1 tablet (500 mg total) by mouth 2 (two) times daily. 20 tablet 0  . metoprolol succinate (TOPROL-XL) 100 MG 24 hr tablet TAKE ONE TABLET BY MOUTH DAILY WITH OR IMMEDIATELY FOLLOWING A MEAL 30 tablet 4  . mirtazapine (REMERON) 15 MG tablet TAKE ONE TABLET BY MOUTH EVERY NIGHT AT BEDTIME 30 tablet 5  . montelukast (SINGULAIR) 10 MG tablet TAKE ONE TABLET BY MOUTH AT BEDTIME 30 tablet 4  . Multiple Vitamins-Minerals (MULTIVITAMIN WITH MINERALS) tablet Take 1 tablet by mouth daily.    Marland Kitchen omeprazole (PRILOSEC) 20 MG capsule TAKE 2 CAPSULES BY MOUTH EVERY MORNING BEFORE BREAKFAST AND 1 CAPSULE EVERY EVENING BEFORE SUPPER 90 capsule 5  . potassium chloride (K-DUR,KLOR-CON) 10  MEQ tablet TAKE ONE TABLET BY MOUTH EVERY OTHER DAY 15 tablet 5  . SUMAtriptan (IMITREX) 50 MG tablet Take 1 tablet (50 mg total) by mouth every 2 (two) hours as needed for migraine. May repeat in 2 hours if headache persists or recurs. 10 tablet 0  . SYMBICORT 160-4.5 MCG/ACT inhaler INHALE TWO PUFFS BY MOUTH TWICE A DAY 1 Inhaler 2  . Vitamin D, Ergocalciferol, 2000 units CAPS Take 2,000 Units by mouth daily.     Current Facility-Administered Medications on File Prior to Visit  Medication Dose Route Frequency Provider Last Rate Last Dose  . 0.9 %  sodium chloride infusion  500 mL Intravenous Continuous Ladene Artist, MD        Past Surgical History:  Procedure Laterality Date  . APPENDECTOMY    .  COLONOSCOPY    . DENTAL SURGERY     wisdom teeth  . ESOPHAGOGASTRODUODENOSCOPY    . LAPAROSCOPIC APPENDECTOMY N/A 06/06/2013   Procedure: APPENDECTOMY LAPAROSCOPIC;  Surgeon: Imogene Burn. Georgette Dover, MD;  Location: [facility];  Service: General;  Laterality: N/A;  . LIVER BIOPSY      Allergies  Allergen Reactions  . Codeine Nausea Only    Needs antiemetic Needs antiemetic  . Amlodipine Other (See Comments)    EDEMA EDEMA  . Dust Mite Extract   . Mold Extract [Trichophyton]   . Tape Rash    Tapes, bandaids Tapes, bandaids    Social History   Socioeconomic History  . Marital status: Single    Spouse name: Not on file  . Number of children: 0  . Years of education: Not on file  . Highest education level: Not on file  Occupational History  . Occupation: Ship broker  . Occupation: Programmer, applications  Social Needs  . Financial resource strain: Not on file  . Food insecurity:    Worry: Not on file    Inability: Not on file  . Transportation needs:    Medical: Not on file    Non-medical: Not on file  Tobacco Use  . Smoking status: Former Smoker    Packs/day: 1.00    Years: 20.00    Pack years: 20.00    Types: Cigarettes    Last attempt to quit: 02/22/2013    Years since quitting: 4.9  . Smokeless tobacco: Never Used  Substance and Sexual Activity  . Alcohol use: Yes    Alcohol/week: 0.0 oz    Comment: social  . Drug use: No  . Sexual activity: Not on file  Lifestyle  . Physical activity:    Days per week: Not on file    Minutes per session: Not on file  . Stress: Not on file  Relationships  . Social connections:    Talks on phone: Not on file    Gets together: Not on file    Attends religious service: Not on file    Active member of club or organization: Not on file    Attends meetings of clubs or organizations: Not on file    Relationship status: Not on file  . Intimate partner violence:    Fear of current or ex partner: Not on file    Emotionally abused: Not on file     Physically abused: Not on file    Forced sexual activity: Not on file  Other Topics Concern  . Not on file  Social History Narrative   Working at [employer] doing [employer] alone (has a Neurosurgeon)   Working  on a degree at [employer], will plan to return fall 2018   Enjoys spending time with family.   Friends and family in [place]    Family History  Problem Relation Age of Onset  . Asthma Mother   . Hypertension Mother   . Breast cancer Father 36  . Diabetes Father   . Hypertension Father   . Heart disease Father   . Hyperlipidemia Father   . Asthma Sister   . Hyperlipidemia Brother   . Hypertension Brother   . Vision loss Brother   . Colon cancer Maternal Grandmother 86  . Liver cancer Maternal Grandmother   . Cervical cancer Maternal Aunt   . Stroke Paternal Uncle 66  . Melanoma Maternal Grandfather 57  . Colon polyps Maternal Grandfather   . AAA (abdominal aortic aneurysm) Paternal Grandmother   . Heart disease Paternal Grandfather   . Heart disease Paternal Uncle 75  . Colon cancer Other 98       MGM's mother  . Stomach cancer Other 61       MGM's mother  . Breast cancer Other        MGM's maternal aunt  . Cancer Other        MGM's maternal uncle with GI cancer  . Esophageal cancer Neg Hx   . Pancreatic cancer Neg Hx   . Rectal cancer Neg Hx     BP 132/71   Pulse 71   Ht 5' 6"  (1.676 m)   Wt 270 lb (122.5 kg)   LMP 01/06/2018   BMI 43.58 kg/m   Review of Systems: See HPI above.     Objective:  Physical Exam:  Gen: NAD, comfortable in exam room  Right shoulder: No swelling, ecchymoses.  No gross deformity. No TTP AC, biceps tendon. Full ER and IR.  Flexion and abduction limited to 80 degrees. Positive Hawkins, Neers. Negative Yergasons. Strength 5/5 with resisted internal/external rotation.  Difficulty holding empty can due to pain. Negative apprehension. Negative drop arm. NV intact distally.  Left shoulder: No swelling, ecchymoses.   No gross deformity. No TTP. FROM. Strength 5/5 with empty can and resisted internal/external rotation. NV intact distally.   Left lower leg: Mod swelling, bruising throughout. FROM with 5/5 strength ankle and knee Mild TTP throughout. NVI distally.  MSK u/s left lower leg:  Soft tissue swelling.  No cortical irregularity of fibula or tibia, edema overlying cortices.  Venous structures compressible.  Assessment & Plan:  1. Right shoulder injury - consistent with rotator cuff strain.  Shown motion exercises to do daily.  Tylenol, voltaren gel, lidocaine patches.  F/u in 2 weeks for reevaluation.  Consider physical therapy, ultrasound depending on improvement.  2. Left lower leg injury - exam, ultrasound reassuring.  Consistent with contusion.

## 2018-02-01 NOTE — Assessment & Plan Note (Signed)
consistent with rotator cuff strain.  Shown motion exercises to do daily.  Tylenol, voltaren gel, lidocaine patches.  F/u in 2 weeks for reevaluation.  Consider physical therapy, ultrasound depending on improvement.

## 2018-02-01 NOTE — Telephone Encounter (Signed)
Spoke with pt. She reports nausea due to current liver cancer and also with migraines. Has taken phenergan in the past and it was not as effective. Needs non-sedating medication. Spoke with [place] at [employer]. Advised #30 tablets for 30 days. He states approval is in system and was authorized on 01/31/18 for #30 tablets. Notified Tom at [employer] and he was able to run claim. Notified pt of approval.

## 2018-02-01 NOTE — Assessment & Plan Note (Signed)
exam, ultrasound reassuring.  Consistent with contusion.

## 2018-02-01 NOTE — Telephone Encounter (Signed)
Routed to wrong office,  Please advise

## 2018-02-03 ENCOUNTER — Emergency Department (HOSPITAL_BASED_OUTPATIENT_CLINIC_OR_DEPARTMENT_OTHER)
Admission: EM | Admit: 2018-02-03 | Discharge: 2018-02-03 | Disposition: A | Discharge status: Home / Self Care | Payer: Managed Care, Other (non HMO) | Attending: Emergency Medicine | Admitting: Emergency Medicine

## 2018-02-03 ENCOUNTER — Encounter (HOSPITAL_BASED_OUTPATIENT_CLINIC_OR_DEPARTMENT_OTHER): Payer: Self-pay | Admitting: *Deleted

## 2018-02-03 ENCOUNTER — Other Ambulatory Visit: Payer: Self-pay

## 2018-02-03 ENCOUNTER — Emergency Department (HOSPITAL_BASED_OUTPATIENT_CLINIC_OR_DEPARTMENT_OTHER): Payer: Managed Care, Other (non HMO)

## 2018-02-03 DIAGNOSIS — Z79899 Other long term (current) drug therapy: Secondary | ICD-10-CM | POA: Diagnosis not present

## 2018-02-03 DIAGNOSIS — Z87891 Personal history of nicotine dependence: Secondary | ICD-10-CM | POA: Diagnosis not present

## 2018-02-03 DIAGNOSIS — E1122 Type 2 diabetes mellitus with diabetic chronic kidney disease: Secondary | ICD-10-CM | POA: Diagnosis not present

## 2018-02-03 DIAGNOSIS — Z7984 Long term (current) use of oral hypoglycemic drugs: Secondary | ICD-10-CM | POA: Insufficient documentation

## 2018-02-03 DIAGNOSIS — R1011 Right upper quadrant pain: Secondary | ICD-10-CM | POA: Diagnosis present

## 2018-02-03 DIAGNOSIS — I129 Hypertensive chronic kidney disease with stage 1 through stage 4 chronic kidney disease, or unspecified chronic kidney disease: Secondary | ICD-10-CM | POA: Diagnosis not present

## 2018-02-03 DIAGNOSIS — N189 Chronic kidney disease, unspecified: Secondary | ICD-10-CM | POA: Diagnosis not present

## 2018-02-03 LAB — COMPREHENSIVE METABOLIC PANEL
ALBUMIN: 4 g/dL (ref 3.5–5.0)
ALK PHOS: 125 U/L (ref 38–126)
ALT: 119 U/L — AB (ref 0–44)
AST: 86 U/L — ABNORMAL HIGH (ref 15–41)
Anion gap: 10 (ref 5–15)
BILIRUBIN TOTAL: 0.4 mg/dL (ref 0.3–1.2)
BUN: 10 mg/dL (ref 6–20)
CHLORIDE: 106 mmol/L (ref 98–111)
CO2: 25 mmol/L (ref 22–32)
Calcium: 9 mg/dL (ref 8.9–10.3)
Creatinine, Ser: 0.91 mg/dL (ref 0.44–1.00)
GFR calc Af Amer: >60 mL/min (ref >60)
Glucose, Bld: 110 mg/dL — ABNORMAL HIGH (ref 70–99)
POTASSIUM: 3.8 mmol/L (ref 3.5–5.1)
Sodium: 141 mmol/L (ref 135–145)
Total Protein: 7.6 g/dL (ref 6.5–8.1)

## 2018-02-03 LAB — CBC WITH DIFFERENTIAL/PLATELET
BASOS ABS: 0.1 10*3/uL (ref 0.0–0.1)
Basophils Relative: 1 %
EOS ABS: 0.5 10*3/uL (ref 0.0–0.7)
EOS PCT: 5 %
HCT: 38.7 % (ref 36.0–46.0)
HEMOGLOBIN: 13.1 g/dL (ref 12.0–15.0)
LYMPHS ABS: 3.4 10*3/uL (ref 0.7–4.0)
LYMPHS PCT: 31 %
MCH: 30 pg (ref 26.0–34.0)
MCHC: 33.9 g/dL (ref 30.0–36.0)
MCV: 88.6 fL (ref 78.0–100.0)
Monocytes Absolute: 0.9 10*3/uL (ref 0.1–1.0)
Monocytes Relative: 9 %
NEUTROS PCT: 56 %
Neutro Abs: 6.2 10*3/uL (ref 1.7–7.7)
PLATELETS: 235 10*3/uL (ref 150–400)
RBC: 4.37 MIL/uL (ref 3.87–5.11)
RDW: 13.3 % (ref 11.5–15.5)
WBC: 11.1 10*3/uL — AB (ref 4.0–10.5)

## 2018-02-03 LAB — URINALYSIS, MICROSCOPIC (REFLEX)

## 2018-02-03 LAB — URINALYSIS, ROUTINE W REFLEX MICROSCOPIC
Bilirubin Urine: NEGATIVE
Glucose, UA: NEGATIVE mg/dL
KETONES UR: NEGATIVE mg/dL
LEUKOCYTES UA: NEGATIVE
NITRITE: NEGATIVE
PH: 5.5 (ref 5.0–8.0)
Protein, ur: 30 mg/dL — AB
SPECIFIC GRAVITY, URINE: 1.02 (ref 1.005–1.030)

## 2018-02-03 LAB — PREGNANCY, URINE: PREG TEST UR: NEGATIVE

## 2018-02-03 LAB — LIPASE, BLOOD: Lipase: 32 U/L (ref 11–51)

## 2018-02-03 MED ORDER — IOPAMIDOL (ISOVUE-300) INJECTION 61%
100.0000 mL | Freq: Once | INTRAVENOUS | Status: AC | PRN
  Administered 2018-02-03: 100 mL via INTRAVENOUS

## 2018-02-03 MED ORDER — ONDANSETRON HCL 4 MG/2ML IJ SOLN
4.0000 mg | Freq: Once | INTRAMUSCULAR | Status: AC
  Administered 2018-02-03: 4 mg via INTRAVENOUS
  Filled 2018-02-03: qty 2

## 2018-02-03 MED ORDER — FENTANYL CITRATE (PF) 100 MCG/2ML IJ SOLN
100.0000 ug | Freq: Once | INTRAMUSCULAR | Status: AC
Start: 2018-02-03 — End: 2018-02-03
  Administered 2018-02-03: 100 ug via INTRAVENOUS
  Filled 2018-02-03: qty 2

## 2018-02-03 NOTE — ED Provider Notes (Signed)
[facility] DEPT [facility] Provider Note: Georgena Spurling, MD, FACEP  CSN: [id number] MRN: [id number] ARRIVAL: 02/03/18 at [place]: [place]  Abdominal Pain   HISTORY OF PRESENT ILLNESS  02/03/18 12:27 AM Stephanie Miles is a 37 y.o. female with a history of nonalcoholic steatohepatitis and primary hepatocellular carcinoma status post chemoembolization on June 4.  She is here with a sharp right upper quadrant pain that began about an hour prior to arrival.  It was associated with diaphoresis.  She rates the pain as an 8.5 out of 10, worse with movement or palpation.  She has chronic nausea which worsened with this pain.  She has not vomited.  She denies diarrhea.  She had an ultrasound of the abdomen earlier this year which showed no gallstones.  She is currently on her menses.  Past Medical History:  Diagnosis Date  . Allergy   . Anxiety   . Aortic regurgitation due to bicuspid aortic valve   . Arthritis   . Borderline diabetes 10/20/2016  . Chronic kidney disease    kidney stones  . Depression   . Diabetes type 2, controlled ([place]) 10/20/2016  . Family history of breast cancer in female   . Family history of colon cancer   . Fatty liver 11/28/2016  . GERD (gastroesophageal reflux disease)   . Heart murmur   . Hypertension   . IBS (irritable bowel syndrome)   . Insomnia   . Liver cancer ([place]) 03/2017  . Migraine headache   . NASH (nonalcoholic steatohepatitis) 04/04/2017   Per biopsy 9/18  . OCD (obsessive compulsive disorder)   . Orbital cellulitis    at age 63 was hospitalized for 3 months (almost died)   . OSA (obstructive sleep apnea)   . S/P appy 05/2013  . Sleep apnea     Past Surgical History:  Procedure Laterality Date  . COLONOSCOPY    . DENTAL SURGERY     wisdom teeth  . ESOPHAGOGASTRODUODENOSCOPY    . LAPAROSCOPIC APPENDECTOMY N/A 06/06/2013   Procedure: APPENDECTOMY LAPAROSCOPIC;  Surgeon: Imogene Burn. Georgette Dover, MD;  Location: [facility];  Service:  General;  Laterality: N/A;  . LIVER BIOPSY      Family History  Problem Relation Age of Onset  . Asthma Mother   . Hypertension Mother   . Breast cancer Father 59  . Diabetes Father   . Hypertension Father   . Heart disease Father   . Hyperlipidemia Father   . Asthma Sister   . Hyperlipidemia Brother   . Hypertension Brother   . Vision loss Brother   . Colon cancer Maternal Grandmother 72  . Liver cancer Maternal Grandmother   . Cervical cancer Maternal Aunt   . Stroke Paternal Uncle 47  . Melanoma Maternal Grandfather 69  . Colon polyps Maternal Grandfather   . AAA (abdominal aortic aneurysm) Paternal Grandmother   . Heart disease Paternal Grandfather   . Heart disease Paternal Uncle 69  . Colon cancer Other 64       MGM's mother  . Stomach cancer Other 64       MGM's mother  . Breast cancer Other        MGM's maternal aunt  . Cancer Other        MGM's maternal uncle with GI cancer  . Esophageal cancer Neg Hx   . Pancreatic cancer Neg Hx   . Rectal cancer Neg Hx     Social History   Tobacco  Use  . Smoking status: Former Smoker    Packs/day: 1.00    Years: 20.00    Pack years: 20.00    Types: Cigarettes    Last attempt to quit: 02/22/2013    Years since quitting: 4.9  . Smokeless tobacco: Never Used  Substance Use Topics  . Alcohol use: Yes    Alcohol/week: 0.0 oz    Comment: social  . Drug use: No    Prior to Admission medications   Medication Sig Start Date End Date Taking? Authorizing Provider  ACCU-CHEK FASTCLIX LANCETS MISC Check sugar twice daily 08/21/17   Debbrah Alar, NP  Blood Glucose Monitoring Suppl (ACCU-CHEK GUIDE) w/Device KIT 1 each by Does not apply route daily. 01/09/17   Debbrah Alar, NP  cetirizine (ZYRTEC) 10 MG tablet Take 10 mg by mouth daily.    [provider]  diclofenac sodium (VOLTAREN) 1 % GEL Apply 4 g topically 4 (four) times daily. 01/30/18   Hudnall, Sharyn Lull, MD  dicyclomine (BENTYL) 20 MG tablet TAKE ONE  TABLET BY MOUTH FOUR TIMES A DAY BEFORE MEALS AND AT BEDTIME 11/04/17   Debbrah Alar, NP  DOXORUBICIN HCL IV by Does not apply route.    [provider]  escitalopram (LEXAPRO) 20 MG tablet TAKE ONE TABLET BY MOUTH DAILY 01/02/18   Debbrah Alar, NP  fluticasone (FLONASE) 50 MCG/ACT nasal spray Place 2 sprays into both nostrils daily as needed for allergies.     [provider]  furosemide (LASIX) 40 MG tablet TAKE ONE TABLET BY MOUTH EVERY MORNING 10/04/17   Debbrah Alar, NP  glucose blood (ACCU-CHEK GUIDE) test strip Use as instructed to check blood sugar twice day. 08/21/17   Debbrah Alar, NP  lidocaine (LIDODERM) 5 % Place 1 patch onto the skin daily. Remove & Discard patch within 12 hours or as directed by MD 01/30/18   Dene Gentry, MD  losartan (COZAAR) 100 MG tablet TAKE ONE TABLET BY MOUTH DAILY 09/04/17   Debbrah Alar, NP  metFORMIN (GLUCOPHAGE-XR) 500 MG 24 hr tablet TAKE TWO TABLETS BY MOUTH DAILY WITH BREAKFAST 11/29/17   Debbrah Alar, NP  methocarbamol (ROBAXIN) 500 MG tablet Take 1 tablet (500 mg total) by mouth 2 (two) times daily. 01/22/18   Jacqlyn Larsen, PA-C  metoprolol succinate (TOPROL-XL) 100 MG 24 hr tablet TAKE ONE TABLET BY MOUTH DAILY WITH OR IMMEDIATELY FOLLOWING A MEAL 11/29/17   Debbrah Alar, NP  mirtazapine (REMERON) 15 MG tablet TAKE ONE TABLET BY MOUTH EVERY NIGHT AT BEDTIME 01/02/18   Debbrah Alar, NP  montelukast (SINGULAIR) 10 MG tablet TAKE ONE TABLET BY MOUTH AT BEDTIME 11/29/17   Debbrah Alar, NP  Multiple Vitamins-Minerals (MULTIVITAMIN WITH MINERALS) tablet Take 1 tablet by mouth daily.    [provider]  omeprazole (PRILOSEC) 20 MG capsule TAKE 2 CAPSULES BY MOUTH EVERY MORNING BEFORE BREAKFAST AND 1 CAPSULE EVERY EVENING BEFORE SUPPER 06/23/17   Debbrah Alar, NP  ondansetron (ZOFRAN ODT) 4 MG disintegrating tablet Take 1 tablet (4 mg total) by mouth every 8 (eight) hours as  needed for nausea or vomiting. 01/31/18   Debbrah Alar, NP  potassium chloride (K-DUR,KLOR-CON) 10 MEQ tablet TAKE ONE TABLET BY MOUTH EVERY OTHER DAY 01/02/18   Debbrah Alar, NP  SUMAtriptan (IMITREX) 50 MG tablet Take 1 tablet (50 mg total) by mouth every 2 (two) hours as needed for migraine. May repeat in 2 hours if headache persists or recurs. 10/17/16   Debbrah Alar, NP  [facility]  160-4.5 MCG/ACT inhaler INHALE TWO PUFFS BY MOUTH TWICE A DAY 12/01/17   Debbrah Alar, NP  Vitamin D, Ergocalciferol, 2000 units CAPS Take 2,000 Units by mouth daily.    [provider]    Allergies Codeine; Amlodipine; Dust mite extract; Mold extract [trichophyton]; and Tape   REVIEW OF SYSTEMS  Negative except as noted here or in the History of Present Illness.   PHYSICAL EXAMINATION  Initial Vital Signs Blood pressure (!) 163/130, pulse (!) 103, temperature 99.1 F (37.3 C), temperature source Oral, resp. rate 18, height 5' 6"  (1.676 m), weight 124.7 kg (275 lb), last menstrual period 01/06/2018, SpO2 100 %.  Examination General: Well-developed, well-nourished female in no acute distress; appearance consistent with age of record HENT: normocephalic; atraumatic Eyes: pupils equal, round and reactive to light; extraocular muscles intact; no scleral icterus Neck: supple Heart: regular rate and rhythm Lungs: clear to auscultation bilaterally Abdomen: soft; nondistended; right upper quadrant tenderness; bowel sounds present Extremities: No deformity; full range of motion; pulses normal Neurologic: Awake, alert and oriented; motor function intact in all extremities and symmetric; no facial droop Skin: Warm and dry; bruising of left lower leg Psychiatric: Normal mood and affect   RESULTS  Summary of this visit's results, reviewed by myself:   EKG Interpretation  Date/Time:    Ventricular Rate:    PR Interval:    QRS Duration:   QT Interval:    QTC Calculation:     R Axis:     Text Interpretation:        Laboratory Studies: Results for orders placed or performed during the hospital encounter of 02/03/18 (from the past 24 hour(s))  Lipase, blood     Status: None   Collection Time: 02/03/18 12:36 AM  Result Value Ref Range   Lipase 32 11 - 51 U/L  Comprehensive metabolic panel     Status: Abnormal   Collection Time: 02/03/18 12:36 AM  Result Value Ref Range   Sodium 141 135 - 145 mmol/L   Potassium 3.8 3.5 - 5.1 mmol/L   Chloride 106 98 - 111 mmol/L   CO2 25 22 - 32 mmol/L   Glucose, Bld 110 (H) 70 - 99 mg/dL   BUN 10 6 - 20 mg/dL   Creatinine, Ser 0.91 0.44 - 1.00 mg/dL   Calcium 9.0 8.9 - 10.3 mg/dL   Total Protein 7.6 6.5 - 8.1 g/dL   Albumin 4.0 3.5 - 5.0 g/dL   AST 86 (H) 15 - 41 U/L   ALT 119 (H) 0 - 44 U/L   Alkaline Phosphatase 125 38 - 126 U/L   Total Bilirubin 0.4 0.3 - 1.2 mg/dL   GFR calc non Af Amer >60 >60 mL/min   GFR calc Af Amer >60 >60 mL/min   Anion gap 10 5 - 15  Urinalysis, Routine w reflex microscopic     Status: Abnormal   Collection Time: 02/03/18 12:36 AM  Result Value Ref Range   Color, Urine YELLOW YELLOW   APPearance CLOUDY (A) CLEAR   Specific Gravity, Urine 1.020 1.005 - 1.030   pH 5.5 5.0 - 8.0   Glucose, UA NEGATIVE NEGATIVE mg/dL   Hgb urine dipstick LARGE (A) NEGATIVE   Bilirubin Urine NEGATIVE NEGATIVE   Ketones, ur NEGATIVE NEGATIVE mg/dL   Protein, ur 30 (A) NEGATIVE mg/dL   Nitrite NEGATIVE NEGATIVE   Leukocytes, UA NEGATIVE NEGATIVE  Pregnancy, urine     Status: None   Collection Time: 02/03/18 12:36 AM  Result  Value Ref Range   Preg Test, Ur NEGATIVE NEGATIVE  CBC with Differential/Platelet     Status: Abnormal   Collection Time: 02/03/18 12:36 AM  Result Value Ref Range   WBC 11.1 (H) 4.0 - 10.5 K/uL   RBC 4.37 3.87 - 5.11 MIL/uL   Hemoglobin 13.1 12.0 - 15.0 g/dL   HCT 38.7 36.0 - 46.0 %   MCV 88.6 78.0 - 100.0 fL   MCH 30.0 26.0 - 34.0 pg   MCHC 33.9 30.0 - 36.0 g/dL   RDW  13.3 11.5 - 15.5 %   Platelets 235 150 - 400 K/uL   Neutrophils Relative % 56 %   Neutro Abs 6.2 1.7 - 7.7 K/uL   Lymphocytes Relative 31 %   Lymphs Abs 3.4 0.7 - 4.0 K/uL   Monocytes Relative 9 %   Monocytes Absolute 0.9 0.1 - 1.0 K/uL   Eosinophils Relative 5 %   Eosinophils Absolute 0.5 0.0 - 0.7 K/uL   Basophils Relative 1 %   Basophils Absolute 0.1 0.0 - 0.1 K/uL  Urinalysis, Microscopic (reflex)     Status: Abnormal   Collection Time: 02/03/18 12:36 AM  Result Value Ref Range   RBC / HPF >50 0 - 5 RBC/hpf   WBC, UA 0-5 0 - 5 WBC/hpf   Bacteria, UA FEW (A) NONE SEEN   Squamous Epithelial / LPF 6-10 0 - 5   Imaging Studies: Ct Abdomen Pelvis W Contrast  Result Date: 02/03/2018 CLINICAL DATA:  Abdominal pain for 2 hours. History of hepatocellular carcinoma. Fever, nausea and right upper quadrant abdominal pain. History of diabetes. EXAM: CT ABDOMEN AND PELVIS WITH CONTRAST TECHNIQUE: Multidetector CT imaging of the abdomen and pelvis was performed using the standard protocol following bolus administration of intravenous contrast. CONTRAST:  153m ISOVUE-300 IOPAMIDOL (ISOVUE-300) INJECTION 61% COMPARISON:  CT 01/13/2018 and 03/04/2017. Abdominal MRI 06/26/2017. FINDINGS: Lower chest: Clear lung bases. No significant pleural or pericardial effusion. There is a small hiatal hernia. Hepatobiliary: There are stable linear densities within the right hepatic lobe attributed to previous chemoembolization procedure. The contrast bolus is suboptimal. The hepatic parenchyma is heterogeneous. There are small (subcentimeter) hypervascular lesions peripherally in the right lobe (image 25/2) and inferiorly in the left lobe (image 32/2). No evidence of gallstones, gallbladder wall thickening or biliary dilatation. Pancreas: Unremarkable. No pancreatic ductal dilatation or surrounding inflammatory changes. Spleen: Normal in size without focal abnormality. Adrenals/Urinary Tract: Both adrenal glands appear  normal. The kidneys appear normal without evidence of urinary tract calculus, suspicious lesion or hydronephrosis. No bladder abnormalities are seen. Stomach/Bowel: No evidence of bowel wall thickening, distention or surrounding inflammatory change. Surgical clips adjacent to the cecal tip consistent with previous appendectomy. Vascular/Lymphatic: There are no enlarged abdominal or pelvic lymph nodes. Scattered small lymph nodes in the porta hepatis and upper retroperitoneum are stable. No acute vascular findings. Contrast bolus suboptimal. Reproductive: The uterus and ovaries appear normal. No adnexal mass. Other: No ascites or peritoneal nodularity. Musculoskeletal: No acute or significant osseous findings. IMPRESSION: 1. No acute findings or explanation for the patient's symptoms. Previous appendectomy. 2. Postprocedural changes in the right hepatic lobe from reported previous chemoembolization procedure for hepatocellular carcinoma. The liver is heterogeneous in density with small hypervascular lesions, not well seen on previous CT. Given the patient's history, nonemergent MR follow-up should be considered if not recently performed elsewhere. 3. Stable mildly prominent lymph nodes in the porta hepatis. Electronically Signed   By: WRichardean SaleM.D.   On: 02/03/2018  02:07    ED COURSE and MDM  Nursing notes and initial vitals signs, including pulse oximetry, reviewed.  Vitals:   02/03/18 0015 02/03/18 0016  BP: (!) 163/130   Pulse: (!) 103   Resp: 18   Temp: 99.1 F (37.3 C)   TempSrc: Oral   SpO2: 100%   Weight:  124.7 kg (275 lb)  Height:  5' 6"  (1.676 m)   2:18 AM Patient's pain has improved.  She was advised of CT and lab findings.  As noted above she has known hepatocellular carcinoma and no history of gallstones.  She is comfortable going home at this time.  She has some oxycodone at home should she needed for pain.  She was advised to contact her oncologist for further evaluation.  She  does have an MRI of the abdomen scheduled for August 5.  PROCEDURES    ED DIAGNOSES     ICD-10-CM   1. Right upper quadrant abdominal pain R10.11        Vivion Romano, Jenny Reichmann, MD 02/03/18 [phone]

## 2018-02-03 NOTE — ED Notes (Signed)
Patient transported to CT 

## 2018-02-03 NOTE — ED Triage Notes (Signed)
Pt reports sudden onset RUQ pain that started about 1 hour PTA. Pt reports that she is currently undergoing treatment for liver CA. Pt nauseated, denies vomiting.

## 2018-02-05 ENCOUNTER — Telehealth: Payer: Self-pay | Admitting: *Deleted

## 2018-02-05 MED ORDER — OMEPRAZOLE 20 MG PO CPDR: DELAYED_RELEASE_CAPSULE | ORAL | 5 refills | Status: DC

## 2018-02-05 NOTE — Telephone Encounter (Signed)
Received request from Kristopher Oppenheim for omeprazole, #90. Refill sent.

## 2018-02-13 ENCOUNTER — Encounter: Payer: Self-pay | Admitting: Family Medicine

## 2018-02-13 ENCOUNTER — Ambulatory Visit: Payer: Self-pay

## 2018-02-13 ENCOUNTER — Ambulatory Visit (INDEPENDENT_AMBULATORY_CARE_PROVIDER_SITE_OTHER): Payer: Managed Care, Other (non HMO) | Admitting: Family Medicine

## 2018-02-13 VITALS — BP 126/82 | HR 71 | Temp 97.9°F | Resp 16 | Ht 66.0 in | Wt 276.0 lb

## 2018-02-13 VITALS — Ht 66.0 in | Wt 276.6 lb

## 2018-02-13 DIAGNOSIS — S4991XA Unspecified injury of right shoulder and upper arm, initial encounter: Secondary | ICD-10-CM

## 2018-02-13 DIAGNOSIS — J029 Acute pharyngitis, unspecified: Secondary | ICD-10-CM | POA: Diagnosis not present

## 2018-02-13 DIAGNOSIS — S4991XD Unspecified injury of right shoulder and upper arm, subsequent encounter: Secondary | ICD-10-CM | POA: Diagnosis not present

## 2018-02-13 LAB — POCT RAPID STREP A (OFFICE): RAPID STREP A SCREEN: NEGATIVE

## 2018-02-13 MED ORDER — OXYCODONE HCL 5 MG PO TABS: 5.0000 mg | ORAL_TABLET | ORAL | 0 refills | Status: DC | PRN

## 2018-02-13 MED ORDER — AMOXICILLIN 875 MG PO TABS
875.0000 mg | ORAL_TABLET | Freq: Two times a day (BID) | ORAL | 0 refills | Status: DC
Start: 2018-02-13 — End: 2018-03-14

## 2018-02-13 NOTE — Progress Notes (Signed)
Patient ID: Stephanie Miles, female    DOB: [birth year]  Age: 37 y.o. MRN: [id number]    Subjective:  Subjective  HPI Stephanie Miles presents for sore throat that has been severe.  + low grade fever  Review of Systems  Constitutional: Negative for activity change, appetite change, fatigue and unexpected weight change.  HENT: Positive for postnasal drip and sore throat. Negative for rhinorrhea, sinus pressure, sinus pain, sneezing and trouble swallowing.   Respiratory: Negative for cough and shortness of breath.   Cardiovascular: Negative for chest pain and palpitations.  Psychiatric/Behavioral: Negative for behavioral problems and dysphoric mood. The patient is not nervous/anxious.     History Past Medical History:  Diagnosis Date  . Allergy   . Anxiety   . Aortic regurgitation due to bicuspid aortic valve   . Arthritis   . Borderline diabetes 10/20/2016  . Chronic kidney disease    kidney stones  . Depression   . Diabetes type 2, controlled ([place]) 10/20/2016  . Family history of breast cancer in female   . Family history of colon cancer   . Fatty liver 11/28/2016  . GERD (gastroesophageal reflux disease)   . Heart murmur   . Hypertension   . IBS (irritable bowel syndrome)   . Insomnia   . Liver cancer ([place]) 03/2017  . Migraine headache   . NASH (nonalcoholic steatohepatitis) 04/04/2017   Per biopsy 9/18  . OCD (obsessive compulsive disorder)   . Orbital cellulitis    at age 29 was hospitalized for 3 months (almost died)   . OSA (obstructive sleep apnea)   . S/P appy 05/2013  . Sleep apnea     She has a past surgical history that includes Dental surgery; laparoscopic appendectomy (N/A, 06/06/2013); Colonoscopy; Esophagogastroduodenoscopy; and Liver biopsy.   Her family history includes AAA (abdominal aortic aneurysm) in her paternal grandmother; Asthma in her mother and sister; Breast cancer in her other; Breast cancer (age of onset: 27) in her father; Cancer in her  other; Cervical cancer in her maternal aunt; Colon cancer (age of onset: 37) in her maternal grandmother; Colon cancer (age of onset: 63) in her other; Colon polyps in her maternal grandfather; Diabetes in her father; Heart disease in her father and paternal grandfather; Heart disease (age of onset: 42) in her paternal uncle; Hyperlipidemia in her brother and father; Hypertension in her brother, father, and mother; Liver cancer in her maternal grandmother; Melanoma (age of onset: 83) in her maternal grandfather; Stomach cancer (age of onset: 77) in her other; Stroke (age of onset: 99) in her paternal uncle; Vision loss in her brother.She reports that she quit smoking about 4 years ago. Her smoking use included cigarettes. She has a 20.00 pack-year smoking history. She has never used smokeless tobacco. She reports that she drinks alcohol. She reports that she does not use drugs.  Current Outpatient Medications on File Prior to Visit  Medication Sig Dispense Refill  . ACCU-CHEK FASTCLIX LANCETS MISC Check sugar twice daily 102 each 3  . Blood Glucose Monitoring Suppl (ACCU-CHEK GUIDE) w/Device KIT 1 each by Does not apply route daily. 1 kit 0  . cetirizine (ZYRTEC) 10 MG tablet Take 10 mg by mouth daily.    . diclofenac sodium (VOLTAREN) 1 % GEL Apply 4 g topically 4 (four) times daily. 3 Tube 2  . dicyclomine (BENTYL) 20 MG tablet TAKE ONE TABLET BY MOUTH FOUR TIMES A DAY BEFORE MEALS AND AT BEDTIME 120 tablet 4  . DOXORUBICIN  HCL IV by Does not apply route.    . escitalopram (LEXAPRO) 20 MG tablet TAKE ONE TABLET BY MOUTH DAILY 30 tablet 5  . fluticasone (FLONASE) 50 MCG/ACT nasal spray Place 2 sprays into both nostrils daily as needed for allergies.     . furosemide (LASIX) 40 MG tablet TAKE ONE TABLET BY MOUTH EVERY MORNING 30 tablet 3  . glucose blood (ACCU-CHEK GUIDE) test strip Use as instructed to check blood sugar twice day. 100 each 1  . lidocaine (LIDODERM) 5 % Place 1 patch onto the skin  daily. Remove & Discard patch within 12 hours or as directed by MD 30 patch 1  . losartan (COZAAR) 100 MG tablet TAKE ONE TABLET BY MOUTH DAILY 30 tablet 5  . metFORMIN (GLUCOPHAGE-XR) 500 MG 24 hr tablet TAKE TWO TABLETS BY MOUTH DAILY WITH BREAKFAST 60 tablet 4  . methocarbamol (ROBAXIN) 500 MG tablet Take 1 tablet (500 mg total) by mouth 2 (two) times daily. 20 tablet 0  . metoprolol succinate (TOPROL-XL) 100 MG 24 hr tablet TAKE ONE TABLET BY MOUTH DAILY WITH OR IMMEDIATELY FOLLOWING A MEAL 30 tablet 4  . mirtazapine (REMERON) 15 MG tablet TAKE ONE TABLET BY MOUTH EVERY NIGHT AT BEDTIME 30 tablet 5  . montelukast (SINGULAIR) 10 MG tablet TAKE ONE TABLET BY MOUTH AT BEDTIME 30 tablet 4  . Multiple Vitamins-Minerals (MULTIVITAMIN WITH MINERALS) tablet Take 1 tablet by mouth daily.    . omeprazole (PRILOSEC) 20 MG capsule TAKE 2 CAPSULES BY MOUTH EVERY MORNING BEFORE BREAKFAST AND 1 CAPSULE EVERY EVENING BEFORE SUPPER 90 capsule 5  . ondansetron (ZOFRAN ODT) 4 MG disintegrating tablet Take 1 tablet (4 mg total) by mouth every 8 (eight) hours as needed for nausea or vomiting. 30 tablet 0  . oxyCODONE (ROXICODONE) 5 MG immediate release tablet Take 1 tablet (5 mg total) by mouth every 4 (four) hours as needed for severe pain. 30 tablet 0  . potassium chloride (K-DUR,KLOR-CON) 10 MEQ tablet TAKE ONE TABLET BY MOUTH EVERY OTHER DAY 15 tablet 5  . SUMAtriptan (IMITREX) 50 MG tablet Take 1 tablet (50 mg total) by mouth every 2 (two) hours as needed for migraine. May repeat in 2 hours if headache persists or recurs. 10 tablet 0  . SYMBICORT 160-4.5 MCG/ACT inhaler INHALE TWO PUFFS BY MOUTH TWICE A DAY 1 Inhaler 2  . Vitamin D, Ergocalciferol, 2000 units CAPS Take 2,000 Units by mouth daily.     No current facility-administered medications on file prior to visit.      Objective:  Objective  Physical Exam  Constitutional: She is oriented to person, place, and time. She appears well-developed and  well-nourished.  HENT:  Right Ear: External ear normal.  Left Ear: External ear normal.  Mouth/Throat: Oropharyngeal exudate and posterior oropharyngeal erythema present.  + PND + errythema  Eyes: Conjunctivae are normal. Right eye exhibits no discharge. Left eye exhibits no discharge.  Cardiovascular: Normal rate, regular rhythm and normal heart sounds.  No murmur heard. Pulmonary/Chest: Effort normal and breath sounds normal. No respiratory distress. She has no wheezes. She has no rales. She exhibits no tenderness.  Musculoskeletal: She exhibits no edema.  Lymphadenopathy:    She has cervical adenopathy.  Neurological: She is alert and oriented to person, place, and time.  Nursing note and vitals reviewed.  BP 126/82 (BP Location: Left Arm, Patient Position: Sitting, Cuff Size: Normal)   Pulse 71   Temp 97.9 F (36.6 C) (Oral)   Resp 16     Ht 5' 6" (1.676 m)   Wt 276 lb (125.2 kg)   SpO2 97%   BMI 44.55 kg/m  Wt Readings from Last 3 Encounters:  02/13/18 276 lb (125.2 kg)  02/13/18 276 lb 9.6 oz (125.5 kg)  02/03/18 275 lb (124.7 kg)     Lab Results  Component Value Date   WBC 11.1 (H) 02/03/2018   HGB 13.1 02/03/2018   HCT 38.7 02/03/2018   PLT 235 02/03/2018   GLUCOSE 110 (H) 02/03/2018   CHOL 197 11/21/2016   TRIG 316.0 (H) 11/21/2016   HDL 37.90 (L) 11/21/2016   LDLDIRECT 126.0 11/21/2016   ALT 119 (H) 02/03/2018   AST 86 (H) 02/03/2018   NA 141 02/03/2018   K 3.8 02/03/2018   CL 106 02/03/2018   CREATININE 0.91 02/03/2018   BUN 10 02/03/2018   CO2 25 02/03/2018   TSH 2.37 02/15/2017   INR 1.11 03/01/2017   HGBA1C 7.4 (H) 08/15/2017    Ct Abdomen Pelvis W Contrast  Result Date: 02/03/2018 CLINICAL DATA:  Abdominal pain for 2 hours. History of hepatocellular carcinoma. Fever, nausea and right upper quadrant abdominal pain. History of diabetes. EXAM: CT ABDOMEN AND PELVIS WITH CONTRAST TECHNIQUE: Multidetector CT imaging of the abdomen and pelvis was  performed using the standard protocol following bolus administration of intravenous contrast. CONTRAST:  100mL ISOVUE-300 IOPAMIDOL (ISOVUE-300) INJECTION 61% COMPARISON:  CT 01/13/2018 and 03/04/2017. Abdominal MRI 06/26/2017. FINDINGS: Lower chest: Clear lung bases. No significant pleural or pericardial effusion. There is a small hiatal hernia. Hepatobiliary: There are stable linear densities within the right hepatic lobe attributed to previous chemoembolization procedure. The contrast bolus is suboptimal. The hepatic parenchyma is heterogeneous. There are small (subcentimeter) hypervascular lesions peripherally in the right lobe (image 25/2) and inferiorly in the left lobe (image 32/2). No evidence of gallstones, gallbladder wall thickening or biliary dilatation. Pancreas: Unremarkable. No pancreatic ductal dilatation or surrounding inflammatory changes. Spleen: Normal in size without focal abnormality. Adrenals/Urinary Tract: Both adrenal glands appear normal. The kidneys appear normal without evidence of urinary tract calculus, suspicious lesion or hydronephrosis. No bladder abnormalities are seen. Stomach/Bowel: No evidence of bowel wall thickening, distention or surrounding inflammatory change. Surgical clips adjacent to the cecal tip consistent with previous appendectomy. Vascular/Lymphatic: There are no enlarged abdominal or pelvic lymph nodes. Scattered small lymph nodes in the porta hepatis and upper retroperitoneum are stable. No acute vascular findings. Contrast bolus suboptimal. Reproductive: The uterus and ovaries appear normal. No adnexal mass. Other: No ascites or peritoneal nodularity. Musculoskeletal: No acute or significant osseous findings. IMPRESSION: 1. No acute findings or explanation for the patient's symptoms. Previous appendectomy. 2. Postprocedural changes in the right hepatic lobe from reported previous chemoembolization procedure for hepatocellular carcinoma. The liver is heterogeneous  in density with small hypervascular lesions, not well seen on previous CT. Given the patient's history, nonemergent MR follow-up should be considered if not recently performed elsewhere. 3. Stable mildly prominent lymph nodes in the porta hepatis. Electronically Signed   By: William  Veazey M.D.   On: 02/03/2018 02:07     Assessment & Plan:  Plan  I am having Karmel R. Pullin start on amoxicillin. I am also having her maintain her SUMAtriptan, fluticasone, ACCU-CHEK GUIDE, multivitamin with minerals, cetirizine, glucose blood, ACCU-CHEK FASTCLIX LANCETS, losartan, furosemide, Vitamin D (Ergocalciferol), dicyclomine, metoprolol succinate, SYMBICORT, metFORMIN, montelukast, escitalopram, potassium chloride, mirtazapine, DOXORUBICIN HCL IV, methocarbamol, diclofenac sodium, lidocaine, ondansetron, omeprazole, and oxyCODONE.  Meds ordered this encounter  Medications  . amoxicillin (  AMOXIL) 875 MG tablet    Sig: Take 1 tablet (875 mg total) by mouth 2 (two) times daily.    Dispense:  20 tablet    Refill:  0    Problem List Items Addressed This Visit    None    Visit Diagnoses    Sore throat    -  Primary   Relevant Medications   amoxicillin (AMOXIL) 875 MG tablet   Other Relevant Orders   POCT rapid strep A (Completed)   Culture, Group A Strep    tylenol prn Drink fluids  Culture pending Quick strep neg  Follow-up: Return if symptoms worsen or fail to improve.  Yvonne R Lowne Chase, DO     

## 2018-02-13 NOTE — Patient Instructions (Addendum)
You have strained your right rotator cuff. Your ultrasound is reassuring. Try to avoid painful activities (overhead activities, lifting with extended arm) as much as possible except with therapy. Continue arm circles, swings, table slides 3 sets of 10 twice a day to help regain your motion. Start physical therapy and do additional exercises on days you don't go to therapy. Tylenol 550m 1-2 tabs three times a day. Lidocaine patches to help with pain if insurance covers it. Voltaren gel up to 4 times a day for pain. Oxycodone if needed for severe pain. Follow up with me in 1 month for reevaluation.

## 2018-02-13 NOTE — Patient Instructions (Signed)

## 2018-02-13 NOTE — Progress Notes (Signed)
Pre visit review using our clinic review tool, if applicable. No additional management support is needed unless otherwise documented below in the visit note. 

## 2018-02-14 ENCOUNTER — Encounter: Payer: Self-pay | Admitting: Family Medicine

## 2018-02-14 NOTE — Progress Notes (Signed)
PCP: Debbrah Alar, NP  Subjective:   HPI: Patient is a 37 y.o. female here for right shoulder pain.  7/9: Patient reports on 7/1 she fell landing onto either her right hand or right shoulder. Then turned and landed onto left knee. No prior injuries to either area. Pain was up to 6/10 and sharp. Has tried icing initially and now heat. Taking robaxin once daily. Her left leg has started to bruise much more. Pain in right shoulder with attempting overhead motions. No numbness.  7/23: Patient reports she's a little bit better compared to last visit but not much. She's using voltaren gel, taking tylenol, robaxin, has some old oxycodone she's taking as well as needed. Doing motion exercises. Feels like shoulder is popping out and in. Pain level 6/10 and sharp with overhead motions. No numbness.  Past Medical History:  Diagnosis Date  . Allergy   . Anxiety   . Aortic regurgitation due to bicuspid aortic valve   . Arthritis   . Borderline diabetes 10/20/2016  . Chronic kidney disease    kidney stones  . Depression   . Diabetes type 2, controlled ([place]) 10/20/2016  . Family history of breast cancer in female   . Family history of colon cancer   . Fatty liver 11/28/2016  . GERD (gastroesophageal reflux disease)   . Heart murmur   . Hypertension   . IBS (irritable bowel syndrome)   . Insomnia   . Liver cancer ([place]) 03/2017  . Migraine headache   . NASH (nonalcoholic steatohepatitis) 04/04/2017   Per biopsy 9/18  . OCD (obsessive compulsive disorder)   . Orbital cellulitis    at age 10 was hospitalized for 3 months (almost died)   . OSA (obstructive sleep apnea)   . S/P appy 05/2013  . Sleep apnea     Current Outpatient Medications on File Prior to Visit  Medication Sig Dispense Refill  . ACCU-CHEK FASTCLIX LANCETS MISC Check sugar twice daily 102 each 3  . Blood Glucose Monitoring Suppl (ACCU-CHEK GUIDE) w/Device KIT 1 each by Does not apply route daily. 1 kit 0  .  cetirizine (ZYRTEC) 10 MG tablet Take 10 mg by mouth daily.    . diclofenac sodium (VOLTAREN) 1 % GEL Apply 4 g topically 4 (four) times daily. 3 Tube 2  . dicyclomine (BENTYL) 20 MG tablet TAKE ONE TABLET BY MOUTH FOUR TIMES A DAY BEFORE MEALS AND AT BEDTIME 120 tablet 4  . DOXORUBICIN HCL IV by Does not apply route.    Marland Kitchen escitalopram (LEXAPRO) 20 MG tablet TAKE ONE TABLET BY MOUTH DAILY 30 tablet 5  . fluticasone (FLONASE) 50 MCG/ACT nasal spray Place 2 sprays into both nostrils daily as needed for allergies.     . furosemide (LASIX) 40 MG tablet TAKE ONE TABLET BY MOUTH EVERY MORNING 30 tablet 3  . glucose blood (ACCU-CHEK GUIDE) test strip Use as instructed to check blood sugar twice day. 100 each 1  . lidocaine (LIDODERM) 5 % Place 1 patch onto the skin daily. Remove & Discard patch within 12 hours or as directed by MD 30 patch 1  . losartan (COZAAR) 100 MG tablet TAKE ONE TABLET BY MOUTH DAILY 30 tablet 5  . metFORMIN (GLUCOPHAGE-XR) 500 MG 24 hr tablet TAKE TWO TABLETS BY MOUTH DAILY WITH BREAKFAST 60 tablet 4  . methocarbamol (ROBAXIN) 500 MG tablet Take 1 tablet (500 mg total) by mouth 2 (two) times daily. 20 tablet 0  . metoprolol succinate (TOPROL-XL) 100 MG  24 hr tablet TAKE ONE TABLET BY MOUTH DAILY WITH OR IMMEDIATELY FOLLOWING A MEAL 30 tablet 4  . mirtazapine (REMERON) 15 MG tablet TAKE ONE TABLET BY MOUTH EVERY NIGHT AT BEDTIME 30 tablet 5  . montelukast (SINGULAIR) 10 MG tablet TAKE ONE TABLET BY MOUTH AT BEDTIME 30 tablet 4  . Multiple Vitamins-Minerals (MULTIVITAMIN WITH MINERALS) tablet Take 1 tablet by mouth daily.    Marland Kitchen omeprazole (PRILOSEC) 20 MG capsule TAKE 2 CAPSULES BY MOUTH EVERY MORNING BEFORE BREAKFAST AND 1 CAPSULE EVERY EVENING BEFORE SUPPER 90 capsule 5  . ondansetron (ZOFRAN ODT) 4 MG disintegrating tablet Take 1 tablet (4 mg total) by mouth every 8 (eight) hours as needed for nausea or vomiting. 30 tablet 0  . potassium chloride (K-DUR,KLOR-CON) 10 MEQ tablet TAKE  ONE TABLET BY MOUTH EVERY OTHER DAY 15 tablet 5  . SUMAtriptan (IMITREX) 50 MG tablet Take 1 tablet (50 mg total) by mouth every 2 (two) hours as needed for migraine. May repeat in 2 hours if headache persists or recurs. 10 tablet 0  . SYMBICORT 160-4.5 MCG/ACT inhaler INHALE TWO PUFFS BY MOUTH TWICE A DAY 1 Inhaler 2  . Vitamin D, Ergocalciferol, 2000 units CAPS Take 2,000 Units by mouth daily.     No current facility-administered medications on file prior to visit.     Past Surgical History:  Procedure Laterality Date  . COLONOSCOPY    . DENTAL SURGERY     wisdom teeth  . ESOPHAGOGASTRODUODENOSCOPY    . LAPAROSCOPIC APPENDECTOMY N/A 06/06/2013   Procedure: APPENDECTOMY LAPAROSCOPIC;  Surgeon: Imogene Burn. Georgette Dover, MD;  Location: [facility];  Service: General;  Laterality: N/A;  . LIVER BIOPSY      Allergies  Allergen Reactions  . Codeine Nausea Only    Needs antiemetic Needs antiemetic  . Amlodipine Other (See Comments)    EDEMA EDEMA  . Dust Mite Extract   . Mold Extract [Trichophyton]   . Tape Rash    Tapes, bandaids Tapes, bandaids    Social History   Socioeconomic History  . Marital status: Single    Spouse name: Not on file  . Number of children: 0  . Years of education: Not on file  . Highest education level: Not on file  Occupational History  . Occupation: Ship broker  . Occupation: Programmer, applications  Social Needs  . Financial resource strain: Not on file  . Food insecurity:    Worry: Not on file    Inability: Not on file  . Transportation needs:    Medical: Not on file    Non-medical: Not on file  Tobacco Use  . Smoking status: Former Smoker    Packs/day: 1.00    Years: 20.00    Pack years: 20.00    Types: Cigarettes    Last attempt to quit: 02/22/2013    Years since quitting: 4.9  . Smokeless tobacco: Never Used  Substance and Sexual Activity  . Alcohol use: Yes    Alcohol/week: 0.0 oz    Comment: social  . Drug use: No  . Sexual activity: Not on file   Lifestyle  . Physical activity:    Days per week: Not on file    Minutes per session: Not on file  . Stress: Not on file  Relationships  . Social connections:    Talks on phone: Not on file    Gets together: Not on file    Attends religious service: Not on file    Active member of club  or organization: Not on file    Attends meetings of clubs or organizations: Not on file    Relationship status: Not on file  . Intimate partner violence:    Fear of current or ex partner: Not on file    Emotionally abused: Not on file    Physically abused: Not on file    Forced sexual activity: Not on file  Other Topics Concern  . Not on file  Social History Narrative   Working at [employer] doing [employer] alone (has a Neurosurgeon)   Working on a degree at [employer], will plan to return fall 2018   Enjoys spending time with family.   Friends and family in [place]    Family History  Problem Relation Age of Onset  . Asthma Mother   . Hypertension Mother   . Breast cancer Father 52  . Diabetes Father   . Hypertension Father   . Heart disease Father   . Hyperlipidemia Father   . Asthma Sister   . Hyperlipidemia Brother   . Hypertension Brother   . Vision loss Brother   . Colon cancer Maternal Grandmother 62  . Liver cancer Maternal Grandmother   . Cervical cancer Maternal Aunt   . Stroke Paternal Uncle 33  . Melanoma Maternal Grandfather 65  . Colon polyps Maternal Grandfather   . AAA (abdominal aortic aneurysm) Paternal Grandmother   . Heart disease Paternal Grandfather   . Heart disease Paternal Uncle 61  . Colon cancer Other 43       MGM's mother  . Stomach cancer Other 68       MGM's mother  . Breast cancer Other        MGM's maternal aunt  . Cancer Other        MGM's maternal uncle with GI cancer  . Esophageal cancer Neg Hx   . Pancreatic cancer Neg Hx   . Rectal cancer Neg Hx     Ht 5' 6"  (1.676 m)   Wt 276 lb 9.6 oz (125.5 kg)   BMI 44.64 kg/m   Review of  Systems: See HPI above.     Objective:  Physical Exam:  Gen: NAD, comfortable in exam room  Right shoulder: No swelling, ecchymoses.  No gross deformity. No TTP AC, biceps tendon. Full ER and IR.  Flexion limited t o 70 degrees, abduction 50 degrees and painful. Positive Hawkins, Neers. Negative Yergasons. Strength 5/5 with resisted internal/external rotation.  3/5 strength with empty can. NV intact distally.  MSK u/s right shoulder:  Biceps tendon intact on long and trans views.  Subscapularis intact with mild overlying bursitis.  AC joint normal with minimal arthropathy. Infraspinatus and supraspinatus normal without evidence of tears.  Assessment & Plan:  1. Right shoulder injury - patient's ultrasound today reassuring.  She has some bursitis but otherwise no full thickness tears.  Clinically rotator cuff strain also. Continue motion exercises - add physical therapy.  Tylenol, lidocaine patches, voltaren gel, oxycodone.  F/u in 1 month.

## 2018-02-14 NOTE — Assessment & Plan Note (Signed)
patient's ultrasound today reassuring.  She has some bursitis but otherwise no full thickness tears.  Clinically rotator cuff strain also. Continue motion exercises - add physical therapy.  Tylenol, lidocaine patches, voltaren gel, oxycodone.  F/u in 1 month.

## 2018-02-15 LAB — CULTURE, GROUP A STREP
MICRO NUMBER:: 90870240
SPECIMEN QUALITY: ADEQUATE

## 2018-02-19 ENCOUNTER — Ambulatory Visit: Payer: Managed Care, Other (non HMO) | Admitting: Family

## 2018-02-19 ENCOUNTER — Ambulatory Visit: Payer: Managed Care, Other (non HMO) | Attending: Family Medicine | Admitting: Physical Therapy

## 2018-02-19 ENCOUNTER — Encounter: Payer: Self-pay | Admitting: Physical Therapy

## 2018-02-19 ENCOUNTER — Other Ambulatory Visit: Payer: Self-pay

## 2018-02-19 DIAGNOSIS — M25611 Stiffness of right shoulder, not elsewhere classified: Secondary | ICD-10-CM | POA: Insufficient documentation

## 2018-02-19 DIAGNOSIS — M6281 Muscle weakness (generalized): Secondary | ICD-10-CM | POA: Insufficient documentation

## 2018-02-19 DIAGNOSIS — M25511 Pain in right shoulder: Secondary | ICD-10-CM | POA: Insufficient documentation

## 2018-02-19 DIAGNOSIS — R293 Abnormal posture: Secondary | ICD-10-CM | POA: Diagnosis present

## 2018-02-19 NOTE — Therapy (Addendum)
[facility] [address]  [place], [place], [address] Phone: [phone]   Fax:  [phone]  Physical Therapy Evaluation  Patient Details  Name: MISCHA BRITTINGHAM MRN: [id number] Date of Birth: 02-Jun-1981 Referring Provider: Karlton Lemon   Encounter Date: 02/19/2018  PT End of Session - 02/19/18 1512    Visit Number  1    Number of Visits  13    Date for PT Re-Evaluation  04/02/18    PT Start Time  1404    PT Stop Time  1456    PT Time Calculation (min)  52 min    Activity Tolerance  Patient tolerated treatment well;Patient limited by pain    Behavior During Therapy  [facility] for tasks assessed/performed       Past Medical History:  Diagnosis Date  . Allergy   . Anxiety   . Aortic regurgitation due to bicuspid aortic valve   . Arthritis   . Borderline diabetes 10/20/2016  . Chronic kidney disease    kidney stones  . Depression   . Diabetes type 2, controlled ([place]) 10/20/2016  . Family history of breast cancer in female   . Family history of colon cancer   . Fatty liver 11/28/2016  . GERD (gastroesophageal reflux disease)   . Heart murmur   . Hypertension   . IBS (irritable bowel syndrome)   . Insomnia   . Liver cancer ([place]) 03/2017  . Migraine headache   . NASH (nonalcoholic steatohepatitis) 04/04/2017   Per biopsy 9/18  . OCD (obsessive compulsive disorder)   . Orbital cellulitis    at age 37 was hospitalized for 3 months (almost died)   . OSA (obstructive sleep apnea)   . S/P appy 05/2013  . Sleep apnea     Past Surgical History:  Procedure Laterality Date  . COLONOSCOPY    . DENTAL SURGERY     wisdom teeth  . ESOPHAGOGASTRODUODENOSCOPY    . LAPAROSCOPIC APPENDECTOMY N/A 06/06/2013   Procedure: APPENDECTOMY LAPAROSCOPIC;  Surgeon: Imogene Burn. Georgette Dover, MD;  Location: [place];  Service: General;  Laterality: N/A;  . LIVER BIOPSY      There were no vitals filed for this visit.   Subjective  Assessment - 02/19/18 1422    Subjective  Pt reports that she was playing tennis on July 1st and experience a fall with subsequent injury. In the fall pt hurt her L knee and her R shoulder. Reached out with both hands to protect her liver and prevent that from hitting the ground. Pt states she feels like her shoulder needs to pop all the time - even when it isn't hurting it is uncomfortable. Pt has difficulty applying deodorant and with lifting things overhead. The worst pain occurs when pt rolls over onto the shoulder in the night; the pain is strong enough to wake her.     Limitations  Lifting;House hold activities    Currently in Pain?  No/denies    Pain Score  0-No pain 8 at worst; when laying on it; 0 at rest and not bearing weight through the arm    Pain Location  Shoulder    Pain Orientation  Right    Pain Descriptors / Indicators  Burning    Pain Type  Acute pain    Pain Onset  More than a month ago    Pain Frequency  Constant    Aggravating Factors   Lifting arm overhead    Pain  Relieving Factors  Medication, Heat, Hot shower    Effect of Pain on Daily Activities  Compensations to perform ADLs including putting on deodorant          [facility] PT Assessment - 02/19/18 0001      Assessment   Medical Diagnosis  R shoulder injury    Referring Provider  Shane Hudnall    Onset Date/Surgical Date  01/22/18    Hand Dominance  Right Can use L hand to perform tasks    Next MD Visit  03/16/2018    Prior Therapy  Yes      Balance Screen   Has the patient fallen in the past 6 months  Yes    How many times?  1    Has the patient had a decrease in activity level because of a fear of falling?   No    Is the patient reluctant to leave their home because of a fear of falling?   No      Home Environment   Living Environment  Private residence    Home Layout  One level    [place] - built in      Prior Function   Level of Independence  Independent    Vocation  Full time  employment    Vocation Requirements  On medical leave right now      Observation/Other Assessments   Focus on Therapeutic Outcomes (FOTO)   Intake - status 42%, limitation 58%; Predicted - status 61% (limited 39%)      Posture/Postural Control   Posture/Postural Control  Postural limitations    Postural Limitations  Rounded Shoulders      ROM / Strength   AROM / PROM / Strength  AROM;PROM;Strength      AROM   Overall AROM Comments  To pain-free ROM. Pt has full ROM but with pain    AROM Assessment Site  Shoulder    Right/Left Shoulder  Right    Right Shoulder Flexion  57 Degrees    Right Shoulder ABduction  46 Degrees    Right Shoulder Internal Rotation  70 Degrees positioned in <90 degrees (to tolerance) of abd b/c of pain    Right Shoulder External Rotation  36 Degrees positioned in <90 degrees (to tolerance) of abd b/c of pain      PROM   PROM Assessment Site  Shoulder    Right/Left Shoulder  Right    Right Shoulder External Rotation  --      Strength   Overall Strength Comments  + for pain with all tests    Strength Assessment Site  Shoulder    Right/Left Shoulder  Right    Right Shoulder Flexion  3-/5    Right Shoulder ABduction  3-/5    Right Shoulder Internal Rotation  3/5    Right Shoulder External Rotation  2-/5      Palpation   Palpation comment  TTP along anterior portion of AC joint                Objective measurements completed on examination: See above findings.      [facility] Adult PT Treatment/Exercise - 02/19/18 0001      Exercises   Exercises  Shoulder      Shoulder Exercises: Standing   External Rotation  Right;10 reps;Limitations    External Rotation Limitations  Isometric contraction into wall with 3 second hold    Internal Rotation  10 reps;Right;Limitations;Strengthening  Internal Rotation Limitations  Isometric hold into towel on wall; 3 second hold with VC to pain-free motion    Flexion  10 reps;Right;Strengthening    Flexion  Limitations  Isometric hold; 3 seconds      Shoulder Exercises: Stretch   Internal Rotation Stretch  Other (comment)    Internal Rotation Stretch Limitations  10 reps with 2 second hold; Performed with wand to patient tolerance    Table Stretch - Flexion  5 reps    Table Stretch -Flexion Limitations  5 reps into scaption; pt expressed less discomfort in scaption than true flexion    Other Shoulder Stretches  AAROM abduction with wand; 10 reps with 2 second hold; Performed to patient tolerance    Other Shoulder Stretches  AAROM flexion with wand; 10 reps with 2 second hold; Performed to patient tolerance             PT Education - 02/19/18 1512    Education Details  Pt educated on findings of evaluation, plan of care, and HEP    Person(s) Educated  Patient    Methods  Explanation;Demonstration;Verbal cues;Handout    Comprehension  Verbalized understanding;Returned demonstration;Verbal cues required;Need further instruction       PT Short Term Goals - 02/19/18 1807      PT SHORT TERM GOAL #1   Title  Pt will be independent with initial HEP    Status  New    Target Date  03/12/18        PT Long Term Goals - 02/19/18 1808      PT LONG TERM GOAL #1   Title  Pt will be independent with advanced HEP    Status  New    Target Date  04/02/18      PT LONG TERM GOAL #2   Title  Pt will have R shoulder full AROM with no incresed pain to improve ability to perform and tolerance to functional activities     Status  New    Target Date  04/02/18      PT LONG TERM GOAL #3   Title  Pt will report pain at worst 2/10 with activity to improve tolerance to functional activities     Status  New    Target Date  04/02/18      PT LONG TERM GOAL #4   Title  Pt will have 4+/5 global R shoulder strength to improve ability to perform functional and recreational activities like yoga and tennis    Status  New    Target Date  04/02/18             Plan - 02/19/18 1521    Clinical  Impression Statement  Pt is a 37 y/o F who presents to OP PT with R shoulder pain after fall that occurred on 01/22/2018.  She presents with decreased pain free ROM, point tenderness over the [facility] joint, and impaired ability to perform ADLs to PLOF. These symptoms are consistent with a possible AC joint sprain. These impairments are limiting the patient's ability to partake in recreational activities such as tennis and yoga. Her prognosis is positively impacted by her previous positive response to physical therapy, but is negatively impacted by her concurrent comorbidities including liver cancer. She will benefit from physical therapy to address her functional mobility and progress toward functional goals.     History and Personal Factors relevant to plan of care:  Liver Cancer dx since 03/2017.     Clinical Presentation  Stable    Clinical Presentation due to:  --    Clinical Decision Making  Low    Rehab Potential  Good    PT Frequency  2x / week    PT Duration  6 weeks    PT Treatment/Interventions  ADLs/Self Care Home Management;Cryotherapy;Electrical Stimulation;Iontophoresis 75m/ml Dexamethasone;Moist Heat;Ultrasound;Functional mobility training;Therapeutic activities;Therapeutic exercise;Neuromuscular re-education;Patient/family education;Manual techniques;Passive range of motion;Dry needling;Taping;Vasopneumatic Device    Consulted and Agree with Plan of Care  Patient       Patient will benefit from skilled therapeutic intervention in order to improve the following deficits and impairments:  Hypomobility, Increased edema, Decreased activity tolerance, Decreased strength, Impaired UE functional use, Pain, Increased muscle spasms, Decreased range of motion, Improper body mechanics, Postural dysfunction  Visit Diagnosis: Acute pain of right shoulder  Abnormal posture  Muscle weakness (generalized)  Stiffness of right shoulder, not elsewhere classified     Problem List Patient Active  Problem List   Diagnosis Date Noted  . Right shoulder injury, subsequent encounter 02/01/2018  . Left leg injury, initial encounter 02/01/2018  . Hepatocellular carcinoma ([place] 01/31/2018  . Vitamin D deficiency 11/01/2017  . Black stools 09/13/2017  . Liver lesion 06/20/2017  . Diarrhea 06/20/2017  . NASH (nonalcoholic steatohepatitis) 04/04/2017  . Iron deficiency anemia due to chronic blood loss 03/21/2017  . Genetic testing 02/28/2017  . Family history of breast cancer in female   . Family history of colon cancer   . Elevated LFTs 01/11/2017  . RUQ abdominal pain 01/11/2017  . PTSD (post-traumatic stress disorder) 12/28/2016  . Primary osteoarthritis of both knees 12/28/2016  . ANA positive 12/28/2016  . Leukocytosis 12/06/2016  . Fatty liver 11/28/2016  . History of kidney stones 10/21/2016  . Osteoarthritis 10/21/2016  . Diabetes type 2, controlled ([place] 10/20/2016  . Cough variant asthma vs uacs  07/07/2015  . Severe obesity (BMI >= 40) ([place] 07/07/2015  . Dyspnea 07/07/2015  . Asthma 05/27/2015  . Aortic regurgitation due to bicuspid aortic valve 03/12/2014  . Personal history of tobacco use, presenting hazards to health 01/20/2014  . Anxiety state 10/16/2013  . Migraine 07/08/2013  . Sleep apnea 07/08/2013  . IBS (irritable bowel syndrome) 06/06/2013  . GERD (gastroesophageal reflux disease) 06/06/2013  . HTN (hypertension) 06/06/2013    JShirline Frees SPT 02/19/2018, 6:46 PM  [facility] [place] [address]Phone: [phone]  Fax:  [phone] Name: JIRMGARD RAMPERSAUDMRN: [id number] of Birth: 510/30/1982

## 2018-02-20 ENCOUNTER — Encounter: Payer: Self-pay | Admitting: Physical Therapy

## 2018-02-20 ENCOUNTER — Ambulatory Visit: Payer: Managed Care, Other (non HMO) | Admitting: Physical Therapy

## 2018-02-20 DIAGNOSIS — M25511 Pain in right shoulder: Secondary | ICD-10-CM | POA: Diagnosis not present

## 2018-02-20 DIAGNOSIS — R293 Abnormal posture: Secondary | ICD-10-CM

## 2018-02-20 DIAGNOSIS — M6281 Muscle weakness (generalized): Secondary | ICD-10-CM

## 2018-02-20 DIAGNOSIS — M25611 Stiffness of right shoulder, not elsewhere classified: Secondary | ICD-10-CM

## 2018-02-20 NOTE — Therapy (Addendum)
[facility] [address]  [place], [place], [address] Phone: [phone]   Fax:  [phone]  Physical Therapy Treatment  Patient Details  Name: Stephanie Miles MRN: [id number] Date of Birth: Oct 19, 1980 Referring Provider: Karlton Lemon   Encounter Date: 02/20/2018  PT End of Session - 02/20/18 0808    Visit Number  2    Number of Visits  13    Date for PT Re-Evaluation  04/02/18    PT Start Time  0806    PT Stop Time  0908    PT Time Calculation (min)  62 min    Activity Tolerance  Patient tolerated treatment well;Patient limited by pain    Behavior During Therapy  [facility] for tasks assessed/performed       Past Medical History:  Diagnosis Date  . Allergy   . Anxiety   . Aortic regurgitation due to bicuspid aortic valve   . Arthritis   . Borderline diabetes 10/20/2016  . Chronic kidney disease    kidney stones  . Depression   . Diabetes type 2, controlled ([place]) 10/20/2016  . Family history of breast cancer in female   . Family history of colon cancer   . Fatty liver 11/28/2016  . GERD (gastroesophageal reflux disease)   . Heart murmur   . Hypertension   . IBS (irritable bowel syndrome)   . Insomnia   . Liver cancer ([place]) 03/2017  . Migraine headache   . NASH (nonalcoholic steatohepatitis) 04/04/2017   Per biopsy 9/18  . OCD (obsessive compulsive disorder)   . Orbital cellulitis    at age 34 was hospitalized for 3 months (almost died)   . OSA (obstructive sleep apnea)   . S/P appy 05/2013  . Sleep apnea     Past Surgical History:  Procedure Laterality Date  . COLONOSCOPY    . DENTAL SURGERY     wisdom teeth  . ESOPHAGOGASTRODUODENOSCOPY    . LAPAROSCOPIC APPENDECTOMY N/A 06/06/2013   Procedure: APPENDECTOMY LAPAROSCOPIC;  Surgeon: Imogene Burn. Georgette Dover, MD;  Location: [place];  Service: General;  Laterality: N/A;  . LIVER BIOPSY      There were no vitals filed for this visit.  Subjective Assessment -  02/20/18 0809    Subjective  Pt states that she is well today but that she had increased pain in the R shoulder this morning. Pt took oxycodone before coming to therapy. Pt had a chance to do home exercises yesterday with no issues.     Limitations  Lifting;House hold activities    Currently in Pain?  Yes    Pain Score  3  7 before medication    Pain Location  Shoulder    Pain Orientation  Right    Pain Descriptors / Indicators  Burning    Pain Type  Acute pain    Pain Onset  More than a month ago    Pain Frequency  Constant                       [facility] Adult PT Treatment/Exercise - 02/20/18 0001      Shoulder Exercises: Seated   Internal Rotation  --    Internal Rotation Limitations  --    Flexion  AAROM;10 reps;Other (comment)    Flexion Limitations  Using cane for R UE AAROM    Abduction  --    ABduction Limitations  --      Shoulder  Exercises: Standing   Horizontal ABduction  15 reps;Both;Strengthening    Horizontal ABduction Limitations  Yellow TB    Internal Rotation  AAROM;Both;10 reps    Internal Rotation Limitations  Using cane for R UE AAROM    ABduction  AAROM;Right;10 reps    ABduction Limitations  Using cane for R UE AAROM    Other Standing Exercises  Wall walk-ups with Yellow TB wrapped around wrists; 10 reps up/down the wall to pt tolerance      Shoulder Exercises: Pulleys   Flexion  3 minutes    ABduction  3 minutes      Shoulder Exercises: ROM/Strengthening   Other ROM/Strengthening Exercises  Rhythmic stabilization standing with R shoulder at approx 75 degrees of abduction. Mild pressure applied by PT in all directions. Pt very unstable resisting against adduction and extension.       Shoulder Exercises: Stretch   Corner Stretch  3 reps;30 seconds    Corner Stretch Limitations  With hands as high up on doorframe as pt tolerates    Other Shoulder Stretches  --    Other Shoulder Stretches  --      Modalities   Modalities   Vasopneumatic;Iontophoresis      Iontophoresis   Type of Iontophoresis  Dexamethasone    Location  R anterior shoulder    Dose  80 mA-min, 1.0 mL    Time  4-6 hour patch (#1 of 6)      Vasopneumatic   Number Minutes Vasopneumatic   10 minutes    Vasopnuematic Location   Shoulder    Vasopneumatic Pressure  Low    Vasopneumatic Temperature   Lowest      Manual Therapy   Manual Therapy  Joint mobilization;Soft tissue mobilization    Manual therapy comments  Pt supine hooklying; Joint Mobs and Soft tissue mobilization to R shoulder and pec musculature    Joint Mobilization  Grade 2 inferior glides with abduction; anterior glides with external rotation     Soft tissue mobilization  Increased tension with taut bands in pec musculature that were addressed with STM. Taut bands responded well to CFM.              PT Education - 02/20/18 0922    Education Details  Pt educated on iontophoresis     Person(s) Educated  Patient    Methods  Explanation;Handout    Comprehension  Verbalized understanding       PT Short Term Goals - 02/20/18 0919      PT SHORT TERM GOAL #1   Title  Pt will be independent with initial HEP    Status  On-going      PT SHORT TERM GOAL #2   Status  --        PT Long Term Goals - 02/20/18 0539      PT LONG TERM GOAL #1   Title  Pt will be independent with advanced HEP    Status  On-going      PT LONG TERM GOAL #2   Title  Pt will have R shoulder full AROM with no incresed pain to improve ability to perform and tolerance to functional activities     Status  On-going      PT LONG TERM GOAL #3   Title  Pt will report pain at worst 2/10 with activity to improve tolerance to functional activities     Status  On-going      PT LONG TERM GOAL #4  Title  Pt will have 4+/5 global R shoulder strength to improve ability to perform functional and recreational activities like yoga and tennis    Status  On-going            Plan - 02/20/18 0917     Clinical Impression Statement  Patient tolerated treatment well today with mild increased in pain with PROM during joint mobilization and soft tissue massage. With pain medication taken before session pt was able to gain a greater tolerable AROM today. Pt continues to demonstrate impaired UE functional use, increased pain with movement, and inability to perform ADLs independently and will continue to benefit from physical therapy to address these deficits.     Rehab Potential  Good    PT Treatment/Interventions  ADLs/Self Care Home Management;Cryotherapy;Electrical Stimulation;Iontophoresis 4m/ml Dexamethasone;Moist Heat;Ultrasound;Functional mobility training;Therapeutic activities;Therapeutic exercise;Neuromuscular re-education;Patient/family education;Manual techniques;Passive range of motion;Dry needling;Taping;Vasopneumatic Device    Consulted and Agree with Plan of Care  Patient       Patient will benefit from skilled therapeutic intervention in order to improve the following deficits and impairments:  Hypomobility, Increased edema, Decreased activity tolerance, Decreased strength, Impaired UE functional use, Pain, Increased muscle spasms, Decreased range of motion, Improper body mechanics, Postural dysfunction  Visit Diagnosis: Acute pain of right shoulder  Abnormal posture  Muscle weakness (generalized)  Stiffness of right shoulder, not elsewhere classified     Problem List Patient Active Problem List   Diagnosis Date Noted  . Right shoulder injury, subsequent encounter 02/01/2018  . Left leg injury, initial encounter 02/01/2018  . Hepatocellular carcinoma ([place] 01/31/2018  . Vitamin D deficiency 11/01/2017  . Black stools 09/13/2017  . Liver lesion 06/20/2017  . Diarrhea 06/20/2017  . NASH (nonalcoholic steatohepatitis) 04/04/2017  . Iron deficiency anemia due to chronic blood loss 03/21/2017  . Genetic testing 02/28/2017  . Family history of breast cancer in female   .  Family history of colon cancer   . Elevated LFTs 01/11/2017  . RUQ abdominal pain 01/11/2017  . PTSD (post-traumatic stress disorder) 12/28/2016  . Primary osteoarthritis of both knees 12/28/2016  . ANA positive 12/28/2016  . Leukocytosis 12/06/2016  . Fatty liver 11/28/2016  . History of kidney stones 10/21/2016  . Osteoarthritis 10/21/2016  . Diabetes type 2, controlled ([place] 10/20/2016  . Cough variant asthma vs uacs  07/07/2015  . Severe obesity (BMI >= 40) ([place] 07/07/2015  . Dyspnea 07/07/2015  . Asthma 05/27/2015  . Aortic regurgitation due to bicuspid aortic valve 03/12/2014  . Personal history of tobacco use, presenting hazards to health 01/20/2014  . Anxiety state 10/16/2013  . Migraine 07/08/2013  . Sleep apnea 07/08/2013  . IBS (irritable bowel syndrome) 06/06/2013  . GERD (gastroesophageal reflux disease) 06/06/2013  . HTN (hypertension) 06/06/2013    JShirline Frees SPT 02/20/2018, 12:37 PM  [facility] [place] [address]Phone: [phone]  Fax:  [phone] Name: JTRAM WRENNMRN: [id number] of Birth: 5November 24, 1982 PHYSICAL THERAPY DISCHARGE SUMMARY  Visits from Start of Care: 2  Current functional level related to goals / functional outcomes:   Unable to formally assess as pt only returned for 1 treatment visit following the eval and has not returned in >30 days.   Remaining deficits:   As above.   Education / Equipment:   HEP  Plan: Patient agrees to discharge.  Patient goals were not met. Patient is being discharged due to meeting the  stated rehab goals.  ?????     Percival Spanish, PT, MPT 04/06/18, 9:15 AM  [facility] [address]  [place], [place], [address] Phone: [phone]   Fax:  [phone]

## 2018-02-20 NOTE — Patient Instructions (Signed)

## 2018-02-27 ENCOUNTER — Encounter: Payer: Self-pay | Admitting: Family

## 2018-02-27 ENCOUNTER — Telehealth: Payer: Self-pay | Admitting: *Deleted

## 2018-02-27 MED ORDER — FLUTICASONE PROPIONATE 50 MCG/ACT NA SUSP: 2.0000 | Freq: Every day | NASAL | 2 refills | Status: DC | PRN

## 2018-02-27 NOTE — Telephone Encounter (Signed)
Received FMLA/STD paperwork from [employer];

## 2018-03-02 NOTE — Telephone Encounter (Signed)
Completed as much as possible of Disability Forms; forwarded to provider/SLS 08/09

## 2018-03-06 ENCOUNTER — Telehealth: Payer: Self-pay | Admitting: Family

## 2018-03-07 ENCOUNTER — Encounter: Payer: Managed Care, Other (non HMO) | Admitting: Physical Therapy

## 2018-03-09 ENCOUNTER — Ambulatory Visit: Payer: Managed Care, Other (non HMO) | Attending: Family Medicine | Admitting: Physical Therapy

## 2018-03-10 NOTE — Telephone Encounter (Signed)
Spoke with pt on phone. Form completed and placed in [name]'s folder.

## 2018-03-14 ENCOUNTER — Encounter: Payer: Self-pay | Admitting: Family

## 2018-03-14 ENCOUNTER — Encounter: Payer: Self-pay | Admitting: Medical

## 2018-03-14 ENCOUNTER — Ambulatory Visit (INDEPENDENT_AMBULATORY_CARE_PROVIDER_SITE_OTHER): Payer: Managed Care, Other (non HMO) | Admitting: Medical

## 2018-03-14 VITALS — BP 138/56 | HR 69 | Temp 98.9°F | Resp 16 | Ht 66.0 in | Wt 280.8 lb

## 2018-03-14 DIAGNOSIS — J309 Allergic rhinitis, unspecified: Secondary | ICD-10-CM

## 2018-03-14 DIAGNOSIS — J40 Bronchitis, not specified as acute or chronic: Secondary | ICD-10-CM | POA: Diagnosis not present

## 2018-03-14 DIAGNOSIS — R059 Cough, unspecified: Secondary | ICD-10-CM

## 2018-03-14 DIAGNOSIS — R05 Cough: Secondary | ICD-10-CM

## 2018-03-14 MED ORDER — PREDNISONE 10 MG PO TABS: ORAL_TABLET | ORAL | 0 refills | Status: DC

## 2018-03-14 MED ORDER — DOXYCYCLINE HYCLATE 100 MG PO TABS: 100.0000 mg | ORAL_TABLET | Freq: Two times a day (BID) | ORAL | 0 refills | Status: DC

## 2018-03-14 MED ORDER — BENZONATATE 100 MG PO CAPS: 100.0000 mg | ORAL_CAPSULE | Freq: Three times a day (TID) | ORAL | 0 refills | Status: DC | PRN

## 2018-03-14 NOTE — Patient Instructions (Addendum)
You appear to have bronchitis(possible sinusitis as well). Rest hydrate and tylenol for fever. I am prescribing cough medicine, and and antibiotic. For your nasal congestion you could use otc the counter nasal steroid.   You should gradually get better. If not then notify us and would recommend a chest xray.  If you get any wheezing in light of your asthma history and probable allergy component. Making print prednisone rx available.  Follow up in 7-10 days or as needed

## 2018-03-14 NOTE — Progress Notes (Addendum)
Subjective:    Patient ID: Stephanie Miles, female    DOB: 1980-11-14, 37 y.o.   MRN: [id number]  HPI  Pt in with some nasal and chest congestion since Saturday night. Yesterday started to cough up some mucus. States more chest congestion than any sinus pressure.  She states feels like early uri trying to get into chest. Pt states mild ear pressure/decreaed hearing. But no ear pain.  Pt states temp used to run around 97 in past. Today 98.9.(she states since liver cancer dx temp seems to run higher)  Pt states moderate fatigue.  Pt has liver cancer. She is under treatment. Pt GI specialist is Dr. Monica Martinez. [phone]. Pt saw GI on Monday. Pt states case is going to conference again.   Review of Systems  Constitutional: Negative for chills, fatigue and fever.  HENT: Positive for congestion.   Respiratory: Positive for cough. Negative for chest tightness, shortness of breath and wheezing.        Productive cough.  Cardiovascular: Negative for chest pain and palpitations.  Gastrointestinal: Negative for abdominal distention, abdominal pain, constipation and nausea.  Musculoskeletal: Negative for back pain.  Neurological: Negative for dizziness and headaches.  Hematological: Negative for adenopathy. Does not bruise/bleed easily.  Psychiatric/Behavioral: Negative for behavioral problems and confusion.    Past Medical History:  Diagnosis Date  . Allergy   . Anxiety   . Aortic regurgitation due to bicuspid aortic valve   . Arthritis   . Borderline diabetes 10/20/2016  . Chronic kidney disease    kidney stones  . Depression   . Diabetes type 2, controlled ([place]) 10/20/2016  . Family history of breast cancer in female   . Family history of colon cancer   . Fatty liver 11/28/2016  . GERD (gastroesophageal reflux disease)   . Heart murmur   . Hypertension   . IBS (irritable bowel syndrome)   . Insomnia   . Liver cancer ([place]) 03/2017  . Migraine headache   . NASH (nonalcoholic  steatohepatitis) 04/04/2017   Per biopsy 9/18  . OCD (obsessive compulsive disorder)   . Orbital cellulitis    at age 45 was hospitalized for 3 months (almost died)   . OSA (obstructive sleep apnea)   . S/P appy 05/2013  . Sleep apnea      Social History   Socioeconomic History  . Marital status: Single    Spouse name: Not on file  . Number of children: 0  . Years of education: Not on file  . Highest education level: Not on file  Occupational History  . Occupation: Ship broker  . Occupation: Programmer, applications  Social Needs  . Financial resource strain: Not on file  . Food insecurity:    Worry: Not on file    Inability: Not on file  . Transportation needs:    Medical: Not on file    Non-medical: Not on file  Tobacco Use  . Smoking status: Former Smoker    Packs/day: 1.00    Years: 20.00    Pack years: 20.00    Types: Cigarettes    Last attempt to quit: 02/22/2013    Years since quitting: 5.0  . Smokeless tobacco: Never Used  Substance and Sexual Activity  . Alcohol use: Yes    Alcohol/week: 0.0 standard drinks    Comment: social  . Drug use: No  . Sexual activity: Not on file  Lifestyle  . Physical activity:    Days per week: Not on file  Minutes per session: Not on file  . Stress: Not on file  Relationships  . Social connections:    Talks on phone: Not on file    Gets together: Not on file    Attends religious service: Not on file    Active member of club or organization: Not on file    Attends meetings of clubs or organizations: Not on file    Relationship status: Not on file  . Intimate partner violence:    Fear of current or ex partner: Not on file    Emotionally abused: Not on file    Physically abused: Not on file    Forced sexual activity: Not on file  Other Topics Concern  . Not on file  Social History Narrative   Working at [employer] doing [employer] alone (has a Neurosurgeon)   Working on a degree at [employer], will plan to return fall 2018   Enjoys  spending time with family.   Friends and family in [place]    Past Surgical History:  Procedure Laterality Date  . COLONOSCOPY    . DENTAL SURGERY     wisdom teeth  . ESOPHAGOGASTRODUODENOSCOPY    . LAPAROSCOPIC APPENDECTOMY N/A 06/06/2013   Procedure: APPENDECTOMY LAPAROSCOPIC;  Surgeon: Imogene Burn. Georgette Dover, MD;  Location: [facility];  Service: General;  Laterality: N/A;  . LIVER BIOPSY      Family History  Problem Relation Age of Onset  . Asthma Mother   . Hypertension Mother   . Breast cancer Father 48  . Diabetes Father   . Hypertension Father   . Heart disease Father   . Hyperlipidemia Father   . Asthma Sister   . Hyperlipidemia Brother   . Hypertension Brother   . Vision loss Brother   . Colon cancer Maternal Grandmother 22  . Liver cancer Maternal Grandmother   . Cervical cancer Maternal Aunt   . Stroke Paternal Uncle 63  . Melanoma Maternal Grandfather 57  . Colon polyps Maternal Grandfather   . AAA (abdominal aortic aneurysm) Paternal Grandmother   . Heart disease Paternal Grandfather   . Heart disease Paternal Uncle 59  . Colon cancer Other 59       MGM's mother  . Stomach cancer Other 40       MGM's mother  . Breast cancer Other        MGM's maternal aunt  . Cancer Other        MGM's maternal uncle with GI cancer  . Esophageal cancer Neg Hx   . Pancreatic cancer Neg Hx   . Rectal cancer Neg Hx     Allergies  Allergen Reactions  . Codeine Nausea Only    Needs antiemetic Needs antiemetic  . Amlodipine Other (See Comments)    EDEMA EDEMA  . Dust Mite Extract   . Mold Extract [Trichophyton]   . Tape Rash    Tapes, bandaids Tapes, bandaids    Current Outpatient Medications on File Prior to Visit  Medication Sig Dispense Refill  . ACCU-CHEK FASTCLIX LANCETS MISC Check sugar twice daily 102 each 3  . Blood Glucose Monitoring Suppl (ACCU-CHEK GUIDE) w/Device KIT 1 each by Does not apply route daily. 1 kit 0  . cetirizine (ZYRTEC) 10 MG tablet Take  10 mg by mouth daily.    . diclofenac sodium (VOLTAREN) 1 % GEL Apply 4 g topically 4 (four) times daily. 3 Tube 2  . dicyclomine (BENTYL) 20 MG tablet TAKE ONE  TABLET BY MOUTH FOUR TIMES A DAY BEFORE MEALS AND AT BEDTIME 120 tablet 4  . DOXORUBICIN HCL IV by Does not apply route.    Marland Kitchen escitalopram (LEXAPRO) 20 MG tablet TAKE ONE TABLET BY MOUTH DAILY 30 tablet 5  . fluticasone (FLONASE) 50 MCG/ACT nasal spray Place 2 sprays into both nostrils daily as needed for allergies. 16 g 2  . furosemide (LASIX) 40 MG tablet TAKE ONE TABLET BY MOUTH EVERY MORNING 30 tablet 3  . glucose blood (ACCU-CHEK GUIDE) test strip Use as instructed to check blood sugar twice day. 100 each 1  . lidocaine (LIDODERM) 5 % Place 1 patch onto the skin daily. Remove & Discard patch within 12 hours or as directed by MD 30 patch 1  . losartan (COZAAR) 100 MG tablet TAKE ONE TABLET BY MOUTH DAILY 30 tablet 5  . metFORMIN (GLUCOPHAGE-XR) 500 MG 24 hr tablet TAKE TWO TABLETS BY MOUTH DAILY WITH BREAKFAST 60 tablet 4  . methocarbamol (ROBAXIN) 500 MG tablet Take 1 tablet (500 mg total) by mouth 2 (two) times daily. 20 tablet 0  . metoprolol succinate (TOPROL-XL) 100 MG 24 hr tablet TAKE ONE TABLET BY MOUTH DAILY WITH OR IMMEDIATELY FOLLOWING A MEAL 30 tablet 4  . mirtazapine (REMERON) 15 MG tablet TAKE ONE TABLET BY MOUTH EVERY NIGHT AT BEDTIME 30 tablet 5  . montelukast (SINGULAIR) 10 MG tablet TAKE ONE TABLET BY MOUTH AT BEDTIME 30 tablet 4  . Multiple Vitamins-Minerals (MULTIVITAMIN WITH MINERALS) tablet Take 1 tablet by mouth daily.    Marland Kitchen omeprazole (PRILOSEC) 20 MG capsule TAKE 2 CAPSULES BY MOUTH EVERY MORNING BEFORE BREAKFAST AND 1 CAPSULE EVERY EVENING BEFORE SUPPER 90 capsule 5  . ondansetron (ZOFRAN ODT) 4 MG disintegrating tablet Take 1 tablet (4 mg total) by mouth every 8 (eight) hours as needed for nausea or vomiting. 30 tablet 0  . oxyCODONE (ROXICODONE) 5 MG immediate release tablet Take 1 tablet (5 mg total) by mouth  every 4 (four) hours as needed for severe pain. 30 tablet 0  . potassium chloride (K-DUR,KLOR-CON) 10 MEQ tablet TAKE ONE TABLET BY MOUTH EVERY OTHER DAY 15 tablet 5  . SUMAtriptan (IMITREX) 50 MG tablet Take 1 tablet (50 mg total) by mouth every 2 (two) hours as needed for migraine. May repeat in 2 hours if headache persists or recurs. 10 tablet 0  . SYMBICORT 160-4.5 MCG/ACT inhaler INHALE TWO PUFFS BY MOUTH TWICE A DAY 1 Inhaler 2  . Vitamin D, Ergocalciferol, 2000 units CAPS Take 2,000 Units by mouth daily.     No current facility-administered medications on file prior to visit.     BP (!) 138/56   Pulse 69   Temp 98.9 F (37.2 C) (Oral)   Resp 16   Ht 5' 6"  (1.676 m)   Wt 280 lb 12.8 oz (127.4 kg)   SpO2 97%   BMI 45.32 kg/m       Objective:   Physical Exam   General  Mental Status - Alert. General Appearance - Well groomed. Not in acute distress.  Skin Rashes- No Rashes.  HEENT Head- Normal. Ear Auditory Canal - Left- Normal. Right - Normal.Tympanic Membrane- Left- Normal. Right- Normal. Eye Sclera/Conjunctiva- Left- Normal. Right- Normal. Nose & Sinuses Nasal Mucosa- Left-  Boggy and Congested. Right-  Boggy and  Congested.Bilateral faint mild maxillary tender but no  frontal sinus pressure. Mouth & Throat Lips: Upper Lip- Normal: no dryness, cracking, pallor, cyanosis, or vesicular eruption. Lower Lip-Normal: no dryness,  cracking, pallor, cyanosis or vesicular eruption. Buccal Mucosa- Bilateral- No Aphthous ulcers. Oropharynx- No Discharge or Erythema. Tonsils: Characteristics- Bilateral- No Erythema or Congestion. Size/Enlargement- Bilateral- No enlargement. Discharge- bilateral-None.  Neck Neck- Supple. No Masses. Faint mild submandibular node tender   Chest and Lung Exam Auscultation: Breath Sounds:-Clear even and unlabored.  Cardiovascular Auscultation:Rythm- Regular, rate and rhythm. Murmurs & Other Heart Sounds:Ausculatation of the heart reveal- No  Murmurs.  Lymphatic Head & Neck General Head & Neck Lymphatics: Bilateral: Description- No Localized lymphadenopathy.      Assessment & Plan:  You appear to have bronchitis(possible sinusitis as well). Rest hydrate and tylenol for fever. I am prescribing cough medicine, and and antibiotic. For your nasal congestion you could use otc the counter nasal steroid.   You should gradually get better. If not then notify us and would recommend a chest xray.  If you get any wheezing in light of your asthma history and probable allergy component. Making print prednisone rx available.  Follow up in 7-10 days or as needed  After patient left I did see that she is on metformin for diabetes.  So I did call and try to talk to her directly.  Left a message explaining that in the event that she does have to use prednisone that her sugars could increase so advised low sugar diet.  Keep checking her sugars.  If she sees that her sugars are going above 200 let us know.  And that event could make modifications on oral medications or give mealtime sliding scale insulin.  Hopefully she will not need the prednisone.  But she did bring that up early during the interview.

## 2018-03-16 ENCOUNTER — Encounter: Payer: Self-pay | Admitting: Family Medicine

## 2018-03-16 ENCOUNTER — Ambulatory Visit (INDEPENDENT_AMBULATORY_CARE_PROVIDER_SITE_OTHER): Payer: Managed Care, Other (non HMO) | Admitting: Family Medicine

## 2018-03-16 VITALS — BP 157/97 | HR 73 | Ht 66.0 in | Wt 280.0 lb

## 2018-03-16 DIAGNOSIS — S4991XD Unspecified injury of right shoulder and upper arm, subsequent encounter: Secondary | ICD-10-CM | POA: Diagnosis not present

## 2018-03-16 MED ORDER — NITROGLYCERIN 0.2 MG/HR TD PT24: MEDICATED_PATCH | TRANSDERMAL | 1 refills | Status: DC

## 2018-03-16 NOTE — Patient Instructions (Signed)
Continue the home exercises daily with theraband. Try nitro patches - 1/4th patch to affected area, change daily. Follow up with me in 6 weeks. We can consider a cortisone injection if you're struggling.

## 2018-03-19 ENCOUNTER — Encounter: Payer: Self-pay | Admitting: Family Medicine

## 2018-03-19 NOTE — Progress Notes (Signed)
PCP: Debbrah Alar, NP  Subjective:   HPI: Patient is a 37 y.o. female here for right shoulder pain.  7/9: Patient reports on 7/1 she fell landing onto either her right hand or right shoulder. Then turned and landed onto left knee. No prior injuries to either area. Pain was up to 6/10 and sharp. Has tried icing initially and now heat. Taking robaxin once daily. Her left leg has started to bruise much more. Pain in right shoulder with attempting overhead motions. No numbness.  7/23: Patient reports she's a little bit better compared to last visit but not much. She's using voltaren gel, taking tylenol, robaxin, has some old oxycodone she's taking as well as needed. Doing motion exercises. Feels like shoulder is popping out and in. Pain level 6/10 and sharp with overhead motions. No numbness.  8/23: Patient reports she's doing better. Did physical therapy a couple times. Went out of town and noticed pain was worse after this. She's using voltaren gel, taking tylenol. Pain level down to 4/10 but worse with overhead motions, feels weaker still. She found out she will need radiation in [place] 5 days a week for 2-12 weeks (waiting to hear more details) and may live out there while doing this. No skin changes, numbness.  Past Medical History:  Diagnosis Date  . Allergy   . Anxiety   . Aortic regurgitation due to bicuspid aortic valve   . Arthritis   . Borderline diabetes 10/20/2016  . Chronic kidney disease    kidney stones  . Depression   . Diabetes type 2, controlled ([place]) 10/20/2016  . Family history of breast cancer in female   . Family history of colon cancer   . Fatty liver 11/28/2016  . GERD (gastroesophageal reflux disease)   . Heart murmur   . Hypertension   . IBS (irritable bowel syndrome)   . Insomnia   . Liver cancer ([place]) 03/2017  . Migraine headache   . NASH (nonalcoholic steatohepatitis) 04/04/2017   Per biopsy 9/18  . OCD (obsessive compulsive  disorder)   . Orbital cellulitis    at age 37 was hospitalized for 3 months (almost died)   . OSA (obstructive sleep apnea)   . S/P appy 05/2013  . Sleep apnea     Current Outpatient Medications on File Prior to Visit  Medication Sig Dispense Refill  . ACCU-CHEK FASTCLIX LANCETS MISC Check sugar twice daily 102 each 3  . benzonatate (TESSALON) 100 MG capsule Take 1 capsule (100 mg total) by mouth 3 (three) times daily as needed for cough. 30 capsule 0  . Blood Glucose Monitoring Suppl (ACCU-CHEK GUIDE) w/Device KIT 1 each by Does not apply route daily. 1 kit 0  . cetirizine (ZYRTEC) 10 MG tablet Take 10 mg by mouth daily.    . diclofenac sodium (VOLTAREN) 1 % GEL Apply 4 g topically 4 (four) times daily. 3 Tube 2  . dicyclomine (BENTYL) 20 MG tablet TAKE ONE TABLET BY MOUTH FOUR TIMES A DAY BEFORE MEALS AND AT BEDTIME 120 tablet 4  . DOXORUBICIN HCL IV by Does not apply route.    . doxycycline (VIBRA-TABS) 100 MG tablet Take 1 tablet (100 mg total) by mouth 2 (two) times daily. 14 tablet 0  . escitalopram (LEXAPRO) 20 MG tablet TAKE ONE TABLET BY MOUTH DAILY 30 tablet 5  . fluticasone (FLONASE) 50 MCG/ACT nasal spray Place 2 sprays into both nostrils daily as needed for allergies. 16 g 2  . furosemide (LASIX) 40  MG tablet TAKE ONE TABLET BY MOUTH EVERY MORNING 30 tablet 3  . glucose blood (ACCU-CHEK GUIDE) test strip Use as instructed to check blood sugar twice day. 100 each 1  . lidocaine (LIDODERM) 5 % Place 1 patch onto the skin daily. Remove & Discard patch within 12 hours or as directed by MD 30 patch 1  . losartan (COZAAR) 100 MG tablet TAKE ONE TABLET BY MOUTH DAILY 30 tablet 5  . metFORMIN (GLUCOPHAGE-XR) 500 MG 24 hr tablet TAKE TWO TABLETS BY MOUTH DAILY WITH BREAKFAST 60 tablet 4  . methocarbamol (ROBAXIN) 500 MG tablet Take 1 tablet (500 mg total) by mouth 2 (two) times daily. 20 tablet 0  . metoprolol succinate (TOPROL-XL) 100 MG 24 hr tablet TAKE ONE TABLET BY MOUTH DAILY WITH  OR IMMEDIATELY FOLLOWING A MEAL 30 tablet 4  . mirtazapine (REMERON) 15 MG tablet TAKE ONE TABLET BY MOUTH EVERY NIGHT AT BEDTIME 30 tablet 5  . montelukast (SINGULAIR) 10 MG tablet TAKE ONE TABLET BY MOUTH AT BEDTIME 30 tablet 4  . Multiple Vitamins-Minerals (MULTIVITAMIN WITH MINERALS) tablet Take 1 tablet by mouth daily.    Marland Kitchen omeprazole (PRILOSEC) 20 MG capsule TAKE 2 CAPSULES BY MOUTH EVERY MORNING BEFORE BREAKFAST AND 1 CAPSULE EVERY EVENING BEFORE SUPPER 90 capsule 5  . ondansetron (ZOFRAN ODT) 4 MG disintegrating tablet Take 1 tablet (4 mg total) by mouth every 8 (eight) hours as needed for nausea or vomiting. 30 tablet 0  . oxyCODONE (ROXICODONE) 5 MG immediate release tablet Take 1 tablet (5 mg total) by mouth every 4 (four) hours as needed for severe pain. 30 tablet 0  . potassium chloride (K-DUR,KLOR-CON) 10 MEQ tablet TAKE ONE TABLET BY MOUTH EVERY OTHER DAY 15 tablet 5  . predniSONE (DELTASONE) 10 MG tablet 6 TAB PO DAY 1 5 TAB PO DAY 2 4 TAB PO DAY 3 3 TAB PO DAY 4 2 TAB PO DAY 5 1 TAB PO DAY 6 21 tablet 0  . SUMAtriptan (IMITREX) 50 MG tablet Take 1 tablet (50 mg total) by mouth every 2 (two) hours as needed for migraine. May repeat in 2 hours if headache persists or recurs. 10 tablet 0  . SYMBICORT 160-4.5 MCG/ACT inhaler INHALE TWO PUFFS BY MOUTH TWICE A DAY 1 Inhaler 2  . Vitamin D, Ergocalciferol, 2000 units CAPS Take 2,000 Units by mouth daily.     No current facility-administered medications on file prior to visit.     Past Surgical History:  Procedure Laterality Date  . COLONOSCOPY    . DENTAL SURGERY     wisdom teeth  . ESOPHAGOGASTRODUODENOSCOPY    . LAPAROSCOPIC APPENDECTOMY N/A 06/06/2013   Procedure: APPENDECTOMY LAPAROSCOPIC;  Surgeon: Imogene Burn. Georgette Dover, MD;  Location: [facility];  Service: General;  Laterality: N/A;  . LIVER BIOPSY      Allergies  Allergen Reactions  . Codeine Nausea Only    Needs antiemetic Needs antiemetic  . Amlodipine Other (See  Comments)    EDEMA EDEMA  . Dust Mite Extract   . Mold Extract [Trichophyton]   . Tape Rash    Tapes, bandaids Tapes, bandaids    Social History   Socioeconomic History  . Marital status: Single    Spouse name: Not on file  . Number of children: 0  . Years of education: Not on file  . Highest education level: Not on file  Occupational History  . Occupation: Ship broker  . Occupation: Programmer, applications  Social Needs  . Financial  resource strain: Not on file  . Food insecurity:    Worry: Not on file    Inability: Not on file  . Transportation needs:    Medical: Not on file    Non-medical: Not on file  Tobacco Use  . Smoking status: Former Smoker    Packs/day: 1.00    Years: 20.00    Pack years: 20.00    Types: Cigarettes    Last attempt to quit: 02/22/2013    Years since quitting: 5.0  . Smokeless tobacco: Never Used  Substance and Sexual Activity  . Alcohol use: Yes    Alcohol/week: 0.0 standard drinks    Comment: social  . Drug use: No  . Sexual activity: Not on file  Lifestyle  . Physical activity:    Days per week: Not on file    Minutes per session: Not on file  . Stress: Not on file  Relationships  . Social connections:    Talks on phone: Not on file    Gets together: Not on file    Attends religious service: Not on file    Active member of club or organization: Not on file    Attends meetings of clubs or organizations: Not on file    Relationship status: Not on file  . Intimate partner violence:    Fear of current or ex partner: Not on file    Emotionally abused: Not on file    Physically abused: Not on file    Forced sexual activity: Not on file  Other Topics Concern  . Not on file  Social History Narrative   Working at [employer] doing [employer] alone (has a Neurosurgeon)   Working on a degree at [employer], will plan to return fall 2018   Enjoys spending time with family.   Friends and family in [place]    Family History  Problem Relation Age  of Onset  . Asthma Mother   . Hypertension Mother   . Breast cancer Father 52  . Diabetes Father   . Hypertension Father   . Heart disease Father   . Hyperlipidemia Father   . Asthma Sister   . Hyperlipidemia Brother   . Hypertension Brother   . Vision loss Brother   . Colon cancer Maternal Grandmother 66  . Liver cancer Maternal Grandmother   . Cervical cancer Maternal Aunt   . Stroke Paternal Uncle 42  . Melanoma Maternal Grandfather 25  . Colon polyps Maternal Grandfather   . AAA (abdominal aortic aneurysm) Paternal Grandmother   . Heart disease Paternal Grandfather   . Heart disease Paternal Uncle 65  . Colon cancer Other 77       MGM's mother  . Stomach cancer Other 37       MGM's mother  . Breast cancer Other        MGM's maternal aunt  . Cancer Other        MGM's maternal uncle with GI cancer  . Esophageal cancer Neg Hx   . Pancreatic cancer Neg Hx   . Rectal cancer Neg Hx     BP (!) 157/97   Pulse 73   Ht 5' 6"  (1.676 m)   Wt 280 lb (127 kg)   BMI 45.19 kg/m   Review of Systems: See HPI above.     Objective:  Physical Exam:  Gen: NAD, comfortable in exam room  Right shoulder: No swelling, ecchymoses.  No gross deformity. No TTP. FROM with  painful arc. Positive Hawkins, Neers. Negative Yergasons. Strength 4+/5 with empty can and 5/5 resisted internal/external rotation. Negative apprehension. NV intact distally.  Assessment & Plan:  1. Right shoulder injury - patient's strength and motion have improved compared to last visit.  Her ultrasound was reassuring past visit also.  Continue home exercises with theraband.  Try nitro patches, discussed risk of headache, skin irritation.  F/u in 6 weeks.  Consider injection as well.

## 2018-03-26 ENCOUNTER — Encounter: Payer: Self-pay | Admitting: Family

## 2018-03-26 DIAGNOSIS — I10 Essential (primary) hypertension: Secondary | ICD-10-CM

## 2018-03-27 ENCOUNTER — Telehealth: Payer: Self-pay | Admitting: Family

## 2018-03-27 MED ORDER — GLUCOSE BLOOD VI STRP: ORAL_STRIP | 1 refills | Status: DC

## 2018-03-27 MED ORDER — FUROSEMIDE 40 MG PO TABS: 40.0000 mg | ORAL_TABLET | Freq: Every morning | ORAL | 1 refills | Status: DC

## 2018-03-27 NOTE — Telephone Encounter (Signed)
Spoke with Stephanie Miles at [employer]. Accuchek Guide has been approved due to pt's visual impairment Case # [id number] from 02/25/2018 through 03/26/2021. She states claim went thru for #100 / 30 days. Notified pharmacist and he states that it went thru on their end about 3 hrs ago. Advised him of information we were given and authorization that we just obtained. Left detailed message on pt's voicemail re: approval.

## 2018-03-27 NOTE — Telephone Encounter (Signed)
Copied from [place] [phone]. Topic: Quick Communication - See Telephone Encounter >> Mar 27, 2018  3:21 PM Vernona Rieger wrote: CRM for notification. See Telephone encounter for: 03/27/18.  [facility] prior authorization dept called for glucose blood (ACCU-CHEK GUIDE) test strip, she will be faxing over information as well. The patient would like to have these before she goes into the hospital tomorrow.  [phone] call back number

## 2018-03-28 ENCOUNTER — Encounter: Payer: Self-pay | Admitting: Family

## 2018-04-05 DIAGNOSIS — E781 Pure hyperglyceridemia: Secondary | ICD-10-CM | POA: Insufficient documentation

## 2018-04-05 HISTORY — DX: Pure hyperglyceridemia: E78.1

## 2018-04-27 ENCOUNTER — Ambulatory Visit (INDEPENDENT_AMBULATORY_CARE_PROVIDER_SITE_OTHER): Payer: Managed Care, Other (non HMO) | Admitting: Family

## 2018-04-27 ENCOUNTER — Encounter: Payer: Self-pay | Admitting: Family

## 2018-04-27 DIAGNOSIS — F329 Major depressive disorder, single episode, unspecified: Secondary | ICD-10-CM

## 2018-04-27 DIAGNOSIS — Z23 Encounter for immunization: Secondary | ICD-10-CM

## 2018-04-27 DIAGNOSIS — E1165 Type 2 diabetes mellitus with hyperglycemia: Secondary | ICD-10-CM

## 2018-04-27 DIAGNOSIS — C220 Liver cell carcinoma: Secondary | ICD-10-CM

## 2018-04-27 DIAGNOSIS — F419 Anxiety disorder, unspecified: Secondary | ICD-10-CM

## 2018-04-27 DIAGNOSIS — F32A Depression, unspecified: Secondary | ICD-10-CM

## 2018-04-27 DIAGNOSIS — I10 Essential (primary) hypertension: Secondary | ICD-10-CM

## 2018-04-27 MED ORDER — ALPRAZOLAM 0.5 MG PO TABS: ORAL_TABLET | ORAL | 0 refills | Status: DC

## 2018-04-27 NOTE — Progress Notes (Signed)
Subjective:    Patient ID: Stephanie Miles, female    DOB: [birth year], 37 y.o.   MRN: [id number]  HPI  Patient is a 37 yr old female who presents today for follow up.  Liver cancer- continues to follow at [facility].  States that they are currently watching her liver tumors.  Thinks that they may be responding to her TACE procedure. Notes that they are holding off on additional treatments for now because of the location of her tumors and this is disappointing for her. Feels like she is in a holding pattern.   HTN- maintained on lasix, losartan.  BP Readings from Last 3 Encounters:  04/27/18 132/70  03/16/18 (!) 157/97  03/14/18 (!) 138/56    Working on her diet. Sees endo through [facility]. Also seeing an obesity medicine specialist at [facility].    DM2- this is being managed by endo Lab Results  Component Value Date   HGBA1C 7.4 (H) 08/15/2017   HGBA1C 6.9 (H) 07/31/2017   HGBA1C 6.7 (H) 04/20/2017   Lab Results  Component Value Date   CREATININE 0.91 02/03/2018   Depression/anxiety she plans to go back to work soon.  Her short term disability goes through 11/8. Reports that she is aiming to return the last week in October.  Plans to see her nephews in [facility] which she is looking forward to. Notes that she has been taking lexapro nightly but still having some breakthrough anxiety.   Wt Readings from Last 3 Encounters:  04/27/18 280 lb (127 kg)  03/16/18 280 lb (127 kg)  03/14/18 280 lb 12.8 oz (127.4 kg)        Review of Systems    see HPI  Past Medical History:  Diagnosis Date  . Allergy   . Anxiety   . Aortic regurgitation due to bicuspid aortic valve   . Arthritis   . Borderline diabetes 10/20/2016  . Chronic kidney disease    kidney stones  . Depression   . Diabetes type 2, controlled ([place]) 10/20/2016  . Family history of breast cancer in female   . Family history of colon cancer   . Fatty liver 11/28/2016  . GERD (gastroesophageal reflux disease)   . Heart  murmur   . Hypertension   . IBS (irritable bowel syndrome)   . Insomnia   . Liver cancer ([place]) 03/2017  . Migraine headache   . NASH (nonalcoholic steatohepatitis) 04/04/2017   Per biopsy 9/18  . OCD (obsessive compulsive disorder)   . Orbital cellulitis    at age 79 was hospitalized for 3 months (almost died)   . OSA (obstructive sleep apnea)   . S/P appy 05/2013  . Sleep apnea      Social History   Socioeconomic History  . Marital status: Single    Spouse name: Not on file  . Number of children: 0  . Years of education: Not on file  . Highest education level: Not on file  Occupational History  . Occupation: Ship broker  . Occupation: Programmer, applications  Social Needs  . Financial resource strain: Not on file  . Food insecurity:    Worry: Not on file    Inability: Not on file  . Transportation needs:    Medical: Not on file    Non-medical: Not on file  Tobacco Use  . Smoking status: Former Smoker    Packs/day: 1.00    Years: 20.00    Pack years: 20.00    Types: Cigarettes  Last attempt to quit: 02/22/2013    Years since quitting: 5.1  . Smokeless tobacco: Never Used  Substance and Sexual Activity  . Alcohol use: Yes    Alcohol/week: 0.0 standard drinks    Comment: social  . Drug use: No  . Sexual activity: Not on file  Lifestyle  . Physical activity:    Days per week: Not on file    Minutes per session: Not on file  . Stress: Not on file  Relationships  . Social connections:    Talks on phone: Not on file    Gets together: Not on file    Attends religious service: Not on file    Active member of club or organization: Not on file    Attends meetings of clubs or organizations: Not on file    Relationship status: Not on file  . Intimate partner violence:    Fear of current or ex partner: Not on file    Emotionally abused: Not on file    Physically abused: Not on file    Forced sexual activity: Not on file  Other Topics Concern  . Not on file  Social History  Narrative   Working at [employer] doing [employer] alone (has a Neurosurgeon)   Working on a degree at [employer], will plan to return fall 2018   Enjoys spending time with family.   Friends and family in [place]    Past Surgical History:  Procedure Laterality Date  . COLONOSCOPY    . DENTAL SURGERY     wisdom teeth  . ESOPHAGOGASTRODUODENOSCOPY    . LAPAROSCOPIC APPENDECTOMY N/A 06/06/2013   Procedure: APPENDECTOMY LAPAROSCOPIC;  Surgeon: Imogene Burn. Georgette Dover, MD;  Location: [facility];  Service: General;  Laterality: N/A;  . LIVER BIOPSY      Family History  Problem Relation Age of Onset  . Asthma Mother   . Hypertension Mother   . Breast cancer Father 89  . Diabetes Father   . Hypertension Father   . Heart disease Father   . Hyperlipidemia Father   . Asthma Sister   . Hyperlipidemia Brother   . Hypertension Brother   . Vision loss Brother   . Colon cancer Maternal Grandmother 55  . Liver cancer Maternal Grandmother   . Cervical cancer Maternal Aunt   . Stroke Paternal Uncle 87  . Melanoma Maternal Grandfather 74  . Colon polyps Maternal Grandfather   . AAA (abdominal aortic aneurysm) Paternal Grandmother   . Heart disease Paternal Grandfather   . Heart disease Paternal Uncle 80  . Colon cancer Other 28       MGM's mother  . Stomach cancer Other 42       MGM's mother  . Breast cancer Other        MGM's maternal aunt  . Cancer Other        MGM's maternal uncle with GI cancer  . Esophageal cancer Neg Hx   . Pancreatic cancer Neg Hx   . Rectal cancer Neg Hx     Allergies  Allergen Reactions  . Codeine Nausea Only    Needs antiemetic Needs antiemetic  . Amlodipine Other (See Comments)    EDEMA EDEMA  . Dust Mite Extract   . Mold Extract [Trichophyton]   . Tape Rash    Tapes, bandaids Tapes, bandaids    Current Outpatient Medications on File Prior to Visit  Medication Sig Dispense Refill  . ACCU-CHEK FASTCLIX LANCETS MISC Check sugar  twice daily 102 each 3   . Blood Glucose Monitoring Suppl (ACCU-CHEK GUIDE) w/Device KIT 1 each by Does not apply route daily. 1 kit 0  . cetirizine (ZYRTEC) 10 MG tablet Take 10 mg by mouth daily.    . diclofenac sodium (VOLTAREN) 1 % GEL Apply 4 g topically 4 (four) times daily. 3 Tube 2  . dicyclomine (BENTYL) 20 MG tablet TAKE ONE TABLET BY MOUTH FOUR TIMES A DAY BEFORE MEALS AND AT BEDTIME 120 tablet 4  . DOXORUBICIN HCL IV by Does not apply route.    Marland Kitchen escitalopram (LEXAPRO) 20 MG tablet TAKE ONE TABLET BY MOUTH DAILY 30 tablet 5  . fluticasone (FLONASE) 50 MCG/ACT nasal spray Place 2 sprays into both nostrils daily as needed for allergies. 16 g 2  . furosemide (LASIX) 40 MG tablet Take 1 tablet (40 mg total) by mouth every morning. 90 tablet 1  . glucose blood (ACCU-CHEK GUIDE) test strip Use as instructed to check blood sugar twice day. 100 each 1  . lidocaine (LIDODERM) 5 % Place 1 patch onto the skin daily. Remove & Discard patch within 12 hours or as directed by MD 30 patch 1  . losartan (COZAAR) 100 MG tablet TAKE ONE TABLET BY MOUTH DAILY 30 tablet 5  . metFORMIN (GLUCOPHAGE-XR) 500 MG 24 hr tablet TAKE TWO TABLETS BY MOUTH DAILY WITH BREAKFAST 60 tablet 4  . methocarbamol (ROBAXIN) 500 MG tablet Take 1 tablet (500 mg total) by mouth 2 (two) times daily. 20 tablet 0  . metoprolol succinate (TOPROL-XL) 100 MG 24 hr tablet TAKE ONE TABLET BY MOUTH DAILY WITH OR IMMEDIATELY FOLLOWING A MEAL 30 tablet 4  . mirtazapine (REMERON) 15 MG tablet TAKE ONE TABLET BY MOUTH EVERY NIGHT AT BEDTIME 30 tablet 5  . montelukast (SINGULAIR) 10 MG tablet TAKE ONE TABLET BY MOUTH AT BEDTIME 30 tablet 4  . Multiple Vitamins-Minerals (MULTIVITAMIN WITH MINERALS) tablet Take 1 tablet by mouth daily.    . nitroGLYCERIN (NITRODUR - DOSED IN MG/24 HR) 0.2 mg/hr patch Apply 1/4th patch to affected shoulder, change daily 30 patch 1  . omeprazole (PRILOSEC) 20 MG capsule TAKE 2 CAPSULES BY MOUTH EVERY MORNING BEFORE BREAKFAST AND 1  CAPSULE EVERY EVENING BEFORE SUPPER 90 capsule 5  . ondansetron (ZOFRAN ODT) 4 MG disintegrating tablet Take 1 tablet (4 mg total) by mouth every 8 (eight) hours as needed for nausea or vomiting. 30 tablet 0  . potassium chloride (K-DUR,KLOR-CON) 10 MEQ tablet TAKE ONE TABLET BY MOUTH EVERY OTHER DAY 15 tablet 5  . SUMAtriptan (IMITREX) 50 MG tablet Take 1 tablet (50 mg total) by mouth every 2 (two) hours as needed for migraine. May repeat in 2 hours if headache persists or recurs. 10 tablet 0  . SYMBICORT 160-4.5 MCG/ACT inhaler INHALE TWO PUFFS BY MOUTH TWICE A DAY 1 Inhaler 2  . Vitamin D, Ergocalciferol, 2000 units CAPS Take 2,000 Units by mouth daily.    Marland Kitchen oxyCODONE (ROXICODONE) 5 MG immediate release tablet Take 1 tablet (5 mg total) by mouth every 4 (four) hours as needed for severe pain. (Patient not taking: Reported on 04/27/2018) 30 tablet 0   No current facility-administered medications on file prior to visit.     BP 132/70 (BP Location: Right Arm, Patient Position: Sitting, Cuff Size: Large)   Pulse 72   Temp 98.5 F (36.9 C) (Oral)   Resp 18   Ht _0  (1.676 m)   Wt 280 lb (127 kg)  SpO2 98%   BMI 45.19 kg/m    Objective:   Physical Exam  Constitutional: She is oriented to person, place, and time. She appears well-developed and well-nourished.  Cardiovascular: Normal rate, regular rhythm and normal heart sounds.  No murmur heard. Pulmonary/Chest: Effort normal and breath sounds normal. No respiratory distress. She has no wheezes.  Neurological: She is alert and oriented to person, place, and time.  Skin: Skin is warm and dry.  Psychiatric: She has a normal mood and affect. Her behavior is normal. Judgment and thought content normal.          Assessment & Plan:  Liver cancer- management per oncology/radiation oncology.  HTN- stable on current medications.  DM2- stable. Management per endo.  Depression- stable on lexapro. Having some anxiety. Request prn med.  Xanax sent, obtain uds and a controlled substance contract is signed today.

## 2018-05-01 LAB — PAIN MGMT, PROFILE 8 W/CONF, U
6 Acetylmorphine: NEGATIVE ng/mL (ref <10)
Alcohol Metabolites: NEGATIVE ng/mL (ref <500)
Amphetamines: NEGATIVE ng/mL (ref <500)
Benzodiazepines: NEGATIVE ng/mL (ref <100)
Buprenorphine, Urine: NEGATIVE ng/mL (ref <5)
Cocaine Metabolite: NEGATIVE ng/mL (ref <150)
Codeine: NEGATIVE ng/mL (ref <50)
Creatinine: 174.4 mg/dL
HYDROCODONE: NEGATIVE ng/mL (ref <50)
HYDROMORPHONE: NEGATIVE ng/mL (ref <50)
MARIJUANA METABOLITE: NEGATIVE ng/mL (ref <20)
MDMA: NEGATIVE ng/mL (ref <500)
Morphine: NEGATIVE ng/mL (ref <50)
NORHYDROCODONE: NEGATIVE ng/mL (ref <50)
OPIATES: NEGATIVE ng/mL (ref <100)
OXYCODONE: NEGATIVE ng/mL (ref <100)
Oxidant: NEGATIVE ug/mL (ref <200)
pH: 6.21 (ref 4.5–9.0)

## 2018-05-09 ENCOUNTER — Encounter: Payer: Self-pay | Admitting: Family

## 2018-05-11 ENCOUNTER — Telehealth: Payer: Self-pay | Admitting: *Deleted

## 2018-05-11 NOTE — Telephone Encounter (Signed)
Please contact. Dr. San Jetty nurse at (bariatrics at [facility]) and ask them to let Dr. Marga Hoots know that that due to Tashonna's tight sugar control I have decided to leave her glucophage xr 1068m once daily and not to increase her dosage.

## 2018-05-11 NOTE — Telephone Encounter (Signed)
Received FMLA/STD paperwork from [employer] requesting additional information regarding pt's disability claim, completed as much as possible; forwarded to provider/SLS

## 2018-05-14 ENCOUNTER — Encounter: Payer: Self-pay | Admitting: Family

## 2018-05-17 ENCOUNTER — Telehealth: Payer: Self-pay

## 2018-05-17 DIAGNOSIS — R059 Cough, unspecified: Secondary | ICD-10-CM

## 2018-05-17 DIAGNOSIS — R05 Cough: Secondary | ICD-10-CM

## 2018-05-17 DIAGNOSIS — R0600 Dyspnea, unspecified: Secondary | ICD-10-CM

## 2018-05-17 DIAGNOSIS — I10 Essential (primary) hypertension: Secondary | ICD-10-CM

## 2018-05-17 MED ORDER — LOSARTAN POTASSIUM 100 MG PO TABS: 100.0000 mg | ORAL_TABLET | Freq: Every day | ORAL | 5 refills | Status: DC

## 2018-05-17 MED ORDER — MONTELUKAST SODIUM 10 MG PO TABS: 10.0000 mg | ORAL_TABLET | Freq: Every day | ORAL | 4 refills | Status: DC

## 2018-05-17 MED ORDER — BUDESONIDE-FORMOTEROL FUMARATE 160-4.5 MCG/ACT IN AERO: INHALATION_SPRAY | RESPIRATORY_TRACT | 2 refills | Status: DC

## 2018-05-17 NOTE — Telephone Encounter (Signed)
Rx requests received via fax, filled per protocol.

## 2018-05-25 ENCOUNTER — Ambulatory Visit: Payer: Managed Care, Other (non HMO) | Admitting: Medical

## 2018-05-28 ENCOUNTER — Encounter: Payer: Self-pay | Admitting: Family

## 2018-05-30 ENCOUNTER — Ambulatory Visit: Payer: Managed Care, Other (non HMO) | Admitting: Family

## 2018-06-05 ENCOUNTER — Encounter: Payer: Self-pay | Admitting: Family

## 2018-06-05 MED ORDER — HYDROCORTISONE ACE-PRAMOXINE 1-1 % RE FOAM: 1.0000 | Freq: Two times a day (BID) | RECTAL | 0 refills | Status: DC

## 2018-06-07 ENCOUNTER — Ambulatory Visit: Payer: Managed Care, Other (non HMO) | Admitting: Cardiology

## 2018-06-11 ENCOUNTER — Other Ambulatory Visit: Payer: Self-pay

## 2018-06-11 ENCOUNTER — Encounter: Payer: Self-pay | Admitting: Family

## 2018-06-11 ENCOUNTER — Emergency Department (HOSPITAL_BASED_OUTPATIENT_CLINIC_OR_DEPARTMENT_OTHER)
Admission: EM | Admit: 2018-06-11 | Discharge: 2018-06-11 | Disposition: A | Discharge status: Home / Self Care | Payer: Managed Care, Other (non HMO) | Attending: Emergency Medicine | Admitting: Emergency Medicine

## 2018-06-11 ENCOUNTER — Encounter (HOSPITAL_BASED_OUTPATIENT_CLINIC_OR_DEPARTMENT_OTHER): Payer: Self-pay | Admitting: Emergency Medicine

## 2018-06-11 DIAGNOSIS — R21 Rash and other nonspecific skin eruption: Secondary | ICD-10-CM | POA: Diagnosis not present

## 2018-06-11 DIAGNOSIS — M79605 Pain in left leg: Secondary | ICD-10-CM | POA: Insufficient documentation

## 2018-06-11 DIAGNOSIS — Z5321 Procedure and treatment not carried out due to patient leaving prior to being seen by health care provider: Secondary | ICD-10-CM | POA: Diagnosis not present

## 2018-06-11 NOTE — ED Notes (Signed)
Pt decided to leave and call her oncologist rather than wait to be seen since the waiting room is full.

## 2018-06-11 NOTE — ED Triage Notes (Signed)
Small rash to back of neck. Feels "feverish". Pain in upper left leg. Hemorrhoid problems for a week. Sts she just feels bad.

## 2018-06-12 ENCOUNTER — Ambulatory Visit (HOSPITAL_BASED_OUTPATIENT_CLINIC_OR_DEPARTMENT_OTHER)
Admission: RE | Admit: 2018-06-12 | Discharge: 2018-06-12 | Disposition: A | Discharge status: Home / Self Care | Payer: Managed Care, Other (non HMO) | Source: Ambulatory Visit | Attending: Internal Medicine | Admitting: Internal Medicine

## 2018-06-12 ENCOUNTER — Encounter: Payer: Self-pay | Admitting: Internal Medicine

## 2018-06-12 ENCOUNTER — Ambulatory Visit (INDEPENDENT_AMBULATORY_CARE_PROVIDER_SITE_OTHER): Payer: Managed Care, Other (non HMO) | Admitting: Internal Medicine

## 2018-06-12 ENCOUNTER — Other Ambulatory Visit: Payer: Self-pay | Admitting: Family

## 2018-06-12 VITALS — BP 128/80 | HR 69 | Temp 98.3°F | Resp 16 | Ht 66.0 in | Wt 275.4 lb

## 2018-06-12 DIAGNOSIS — R509 Fever, unspecified: Secondary | ICD-10-CM | POA: Insufficient documentation

## 2018-06-12 DIAGNOSIS — R21 Rash and other nonspecific skin eruption: Secondary | ICD-10-CM | POA: Diagnosis not present

## 2018-06-12 DIAGNOSIS — F419 Anxiety disorder, unspecified: Secondary | ICD-10-CM | POA: Diagnosis not present

## 2018-06-12 DIAGNOSIS — K580 Irritable bowel syndrome with diarrhea: Secondary | ICD-10-CM

## 2018-06-12 LAB — CBC WITH DIFFERENTIAL/PLATELET
BASOS ABS: 0.1 10*3/uL (ref 0.0–0.1)
Basophils Relative: 0.9 % (ref 0.0–3.0)
Eosinophils Absolute: 0.3 10*3/uL (ref 0.0–0.7)
Eosinophils Relative: 2.7 % (ref 0.0–5.0)
HEMATOCRIT: 41.7 % (ref 36.0–46.0)
Hemoglobin: 14.1 g/dL (ref 12.0–15.0)
Lymphocytes Relative: 37.3 % (ref 12.0–46.0)
Lymphs Abs: 3.5 10*3/uL (ref 0.7–4.0)
MCHC: 33.8 g/dL (ref 30.0–36.0)
MCV: 87.1 fl (ref 78.0–100.0)
MONOS PCT: 7.8 % (ref 3.0–12.0)
Monocytes Absolute: 0.7 10*3/uL (ref 0.1–1.0)
NEUTROS PCT: 51.3 % (ref 43.0–77.0)
Neutro Abs: 4.9 10*3/uL (ref 1.4–7.7)
Platelets: 220 10*3/uL (ref 150.0–400.0)
RBC: 4.78 Mil/uL (ref 3.87–5.11)
RDW: 14 % (ref 11.5–15.5)
WBC: 9.5 10*3/uL (ref 4.0–10.5)

## 2018-06-12 LAB — COMPREHENSIVE METABOLIC PANEL
ALK PHOS: 103 U/L (ref 39–117)
ALT: 71 U/L — AB (ref 0–35)
AST: 43 U/L — ABNORMAL HIGH (ref 0–37)
Albumin: 4.1 g/dL (ref 3.5–5.2)
BILIRUBIN TOTAL: 0.5 mg/dL (ref 0.2–1.2)
BUN: 9 mg/dL (ref 6–23)
CO2: 26 meq/L (ref 19–32)
Calcium: 9.1 mg/dL (ref 8.4–10.5)
Chloride: 104 mEq/L (ref 96–112)
Creatinine, Ser: 0.76 mg/dL (ref 0.40–1.20)
GFR: 89.81 mL/min (ref >60.00)
GLUCOSE: 100 mg/dL — AB (ref 70–99)
POTASSIUM: 3.8 meq/L (ref 3.5–5.1)
SODIUM: 140 meq/L (ref 135–145)
TOTAL PROTEIN: 7 g/dL (ref 6.0–8.3)

## 2018-06-12 LAB — POC URINALSYSI DIPSTICK (AUTOMATED)
BILIRUBIN UA: NEGATIVE
Blood, UA: NEGATIVE
GLUCOSE UA: NEGATIVE
Ketones, UA: NEGATIVE
Leukocytes, UA: NEGATIVE
NITRITE UA: NEGATIVE
PH UA: 6 (ref 5.0–8.0)
Protein, UA: NEGATIVE
Spec Grav, UA: 1.02 (ref 1.010–1.025)
Urobilinogen, UA: 0.2 E.U./dL

## 2018-06-12 LAB — POCT URINE PREGNANCY: Preg Test, Ur: NEGATIVE

## 2018-06-12 MED ORDER — HYDROCORTISONE 2.5 % EX CREA: TOPICAL_CREAM | Freq: Two times a day (BID) | CUTANEOUS | 0 refills | Status: DC

## 2018-06-12 MED ORDER — ALPRAZOLAM 0.5 MG PO TABS: ORAL_TABLET | ORAL | 0 refills | Status: DC

## 2018-06-12 NOTE — Patient Instructions (Signed)
See your primary doctor in 10 days to 2 weeks for a follow-up  GO TO THE LAB : Get the blood work      STOP BY THE FIRST FLOOR:  get the XR    Apply the cream twice a day to the rash  Call or go to the ER if you have high fever, increased abdominal pain, blood in the stools, severe abdominal pain.

## 2018-06-12 NOTE — Progress Notes (Signed)
Pre visit review using our clinic review tool, if applicable. No additional management support is needed unless otherwise documented below in the visit note. 

## 2018-06-12 NOTE — Progress Notes (Signed)
Subjective:    Patient ID: Stephanie Miles, female    DOB: June 18, 1981, 37 y.o.   MRN: [id number]  DOS:  06/12/2018 Type of visit - description :  acute, multiple symptoms and concerns  Rash at the posterior aspect of the neck, 2 days ago, it hurts a little, not itching.  No blisters, it was simply "raised and slightly red". Overall improving.  2-day history of low-grade fever, temperature at home has been as high as 99, usually is 97.5. Denies dysuria, gross hematuria. No URI type of symptoms such as runny nose, sore throat or cough.  Soft stools: The patient reports IBS diarrhea, stools are usually watery, for the last 2 weeks stools have been more solid and that is a change in bowel habits. Has some nausea at baseline.  No vomiting. Has a history of liver cancer, after chemoembolization done in June 2019, RUQ abdominal pain decreased but is gradually coming back.  No severe pain.  He has seen red blood per rectum with BMs associated with hemorrhoids, at the time she had itching and rectal burning, but much improved now.  Denies black stools.  Also, a couple of days history of pain at the left posterior tight, no injury, redness or swelling.  "Is kind of weird"  Needs a refill on Xanax   Review of Systems See above   Past Medical History:  Diagnosis Date  . Allergy   . Anxiety   . Aortic regurgitation due to bicuspid aortic valve   . Arthritis   . Borderline diabetes 10/20/2016  . Chronic kidney disease    kidney stones  . Depression   . Diabetes type 2, controlled ([place]) 10/20/2016  . Family history of breast cancer in female   . Family history of colon cancer   . Fatty liver 11/28/2016  . GERD (gastroesophageal reflux disease)   . Heart murmur   . Hypertension   . IBS (irritable bowel syndrome)   . Insomnia   . Liver cancer ([place]) 03/2017  . Migraine headache   . NASH (nonalcoholic steatohepatitis) 04/04/2017   Per biopsy 9/18  . OCD (obsessive compulsive disorder)     . Orbital cellulitis    at age 1 was hospitalized for 3 months (almost died)   . OSA (obstructive sleep apnea)   . S/P appy 05/2013  . Sleep apnea     Past Surgical History:  Procedure Laterality Date  . COLONOSCOPY    . DENTAL SURGERY     wisdom teeth  . ESOPHAGOGASTRODUODENOSCOPY    . LAPAROSCOPIC APPENDECTOMY N/A 06/06/2013   Procedure: APPENDECTOMY LAPAROSCOPIC;  Surgeon: Imogene Burn. Georgette Dover, MD;  Location: [facility];  Service: General;  Laterality: N/A;  . LIVER BIOPSY      Social History   Socioeconomic History  . Marital status: Single    Spouse name: Not on file  . Number of children: 0  . Years of education: Not on file  . Highest education level: Not on file  Occupational History  . Occupation: Ship broker  . Occupation: Programmer, applications  Social Needs  . Financial resource strain: Not on file  . Food insecurity:    Worry: Not on file    Inability: Not on file  . Transportation needs:    Medical: Not on file    Non-medical: Not on file  Tobacco Use  . Smoking status: Former Smoker    Packs/day: 1.00    Years: 20.00    Pack years: 20.00  Types: Cigarettes    Last attempt to quit: 02/22/2013    Years since quitting: 5.3  . Smokeless tobacco: Never Used  Substance and Sexual Activity  . Alcohol use: Never    Alcohol/week: 0.0 standard drinks    Frequency: Never  . Drug use: No  . Sexual activity: Not Currently  Lifestyle  . Physical activity:    Days per week: Not on file    Minutes per session: Not on file  . Stress: Not on file  Relationships  . Social connections:    Talks on phone: Not on file    Gets together: Not on file    Attends religious service: Not on file    Active member of club or organization: Not on file    Attends meetings of clubs or organizations: Not on file    Relationship status: Not on file  . Intimate partner violence:    Fear of current or ex partner: Not on file    Emotionally abused: Not on file    Physically abused: Not on  file    Forced sexual activity: Not on file  Other Topics Concern  . Not on file  Social History Narrative   Working at [employer] doing [employer] alone (has a Neurosurgeon)   Working on a degree at [employer], will plan to return fall 2018   Enjoys spending time with family.   Friends and family in [place] as of 06/12/2018      Reactions   Codeine Nausea Only   Needs antiemetic Needs antiemetic   Amlodipine Other (See Comments)   EDEMA EDEMA   Dust Mite Extract    Mold Extract [trichophyton]    Tape Rash   Tapes, bandaids Tapes, bandaids      Medication List        Accurate as of 06/12/18 11:59 PM. Always use your most recent med list.          ACCU-CHEK FASTCLIX LANCETS Misc Check sugar twice daily   ACCU-CHEK GUIDE w/Device Kit 1 each by Does not apply route daily.   ALPRAZolam 0.5 MG tablet Commonly known as:  XANAX 1 tablet by mouth once daily as needed for anxiety   budesonide-formoterol 160-4.5 MCG/ACT inhaler Commonly known as:  SYMBICORT INHALE TWO PUFFS BY MOUTH TWICE A DAY   cetirizine 10 MG tablet Commonly known as:  ZYRTEC Take 10 mg by mouth daily.   diclofenac sodium 1 % Gel Commonly known as:  VOLTAREN Apply 4 g topically 4 (four) times daily.   dicyclomine 20 MG tablet Commonly known as:  BENTYL TAKE ONE TABLET BY MOUTH FOUR TIMES A DAY BEFORE MEALS AND AT BEDTIME   DOXORUBICIN HCL IV by Does not apply route.   escitalopram 20 MG tablet Commonly known as:  LEXAPRO TAKE ONE TABLET BY MOUTH DAILY   fluticasone 50 MCG/ACT nasal spray Commonly known as:  FLONASE Place 2 sprays into both nostrils daily as needed for allergies.   furosemide 40 MG tablet Commonly known as:  LASIX Take 1 tablet (40 mg total) by mouth every morning.   glucose blood test strip Use as instructed to check blood sugar twice day.   hydrocortisone 2.5 % cream Apply topically 2 (two) times daily.   hydrocortisone-pramoxine rectal  foam Commonly known as:  PROCTOFOAM-HC Place 1 applicator rectally 2 (two) times daily.   losartan 100 MG tablet Commonly known as:  COZAAR Take 1 tablet (  100 mg total) by mouth daily.   metFORMIN 500 MG 24 hr tablet Commonly known as:  GLUCOPHAGE-XR TAKE TWO TABLETS BY MOUTH DAILY WITH BREAKFAST   metoprolol succinate 100 MG 24 hr tablet Commonly known as:  TOPROL-XL TAKE ONE TABLET BY MOUTH DAILY WITH OR IMMEDIATELY FOLLOWING A MEAL   mirtazapine 15 MG tablet Commonly known as:  REMERON TAKE ONE TABLET BY MOUTH EVERY NIGHT AT BEDTIME   montelukast 10 MG tablet Commonly known as:  SINGULAIR Take 1 tablet (10 mg total) by mouth at bedtime.   multivitamin with minerals tablet Take 1 tablet by mouth daily.   nitroGLYCERIN 0.2 mg/hr patch Commonly known as:  NITRODUR - Dosed in mg/24 hr Apply 1/4th patch to affected shoulder, change daily   omeprazole 20 MG capsule Commonly known as:  PRILOSEC TAKE 2 CAPSULES BY MOUTH EVERY MORNING BEFORE BREAKFAST AND 1 CAPSULE EVERY EVENING BEFORE SUPPER   ondansetron 4 MG disintegrating tablet Commonly known as:  ZOFRAN-ODT Take 1 tablet (4 mg total) by mouth every 8 (eight) hours as needed for nausea or vomiting.   oxyCODONE 5 MG immediate release tablet Commonly known as:  Oxy IR/ROXICODONE Take 1 tablet (5 mg total) by mouth every 4 (four) hours as needed for severe pain.   potassium chloride 10 MEQ tablet Commonly known as:  K-DUR,KLOR-CON TAKE ONE TABLET BY MOUTH EVERY OTHER DAY   SUMAtriptan 50 MG tablet Commonly known as:  IMITREX Take 1 tablet (50 mg total) by mouth every 2 (two) hours as needed for migraine. May repeat in 2 hours if headache persists or recurs.   Vitamin D (Ergocalciferol) 50 MCG (2000 UT) Caps Take 2,000 Units by mouth daily.           Objective:   Physical Exam  Neck:     BP 128/80 (BP Location: Left Arm, Patient Position: Sitting, Cuff Size: Normal)   Pulse 69   Temp 98.3 F (36.8 C)  (Oral)   Resp 16   Ht _0  (1.676 m)   Wt 275 lb 6 oz (124.9 kg)   LMP 05/28/2018   SpO2 97%   BMI 44.45 kg/m  General:   Well developed, NAD, BMI noted.  HEENT:  Normocephalic . Face symmetric, atraumatic Lungs:  CTA B Normal respiratory effort, no intercostal retractions, no accessory muscle use. Heart: RRR,  no murmur.  no pretibial edema bilaterally  Abdomen:  Not distended, soft, slightly tender at the right upper quadrant, no mass or rebound.  No hepatomegaly that I can tell. Lower extremities: Inspection and palpation of the posterior aspect of the left leg normal. Neurologic:  alert & oriented X3.  Speech normal, gait appropriate for age and unassisted Psych--  Cognition and judgment appear intact.  Cooperative with normal attention span and concentration.  Behavior appropriate. No anxious or depressed appearing.     Assessment & Plan:    37 year old lady with multiple medical problems including HTN, migraines, sleep apnea, asthma, GERD,  IBS, increase LFTs, DJD, morbid obesity, liver cancer,presents w/ multiple symptoms: Exline  Rash: Atopic dermatitis?  rx hydrocortisone 2.5 % Low-grade fever: Review of systems essentially negative except a change in the stool consistently and mild RUQ tenderness to palpation in the context of liver cancer treated with chemoembolization June 2019. Udip neg Plan: CMP, CBC, chest x-ray, Anxiety: Refill Xanax Pain, left leg: Etiology unclear, observation IBS-D: Now with more solid stools.  Observation UPT neg RTC 10 days to 2 weeks PCP

## 2018-06-20 ENCOUNTER — Telehealth: Payer: Self-pay

## 2018-06-20 DIAGNOSIS — E1165 Type 2 diabetes mellitus with hyperglycemia: Secondary | ICD-10-CM

## 2018-06-20 DIAGNOSIS — I10 Essential (primary) hypertension: Secondary | ICD-10-CM

## 2018-06-20 DIAGNOSIS — R0600 Dyspnea, unspecified: Secondary | ICD-10-CM

## 2018-06-20 DIAGNOSIS — K580 Irritable bowel syndrome with diarrhea: Secondary | ICD-10-CM

## 2018-06-20 MED ORDER — ACCU-CHEK FASTCLIX LANCETS MISC: 3 refills | Status: DC

## 2018-06-20 MED ORDER — METFORMIN HCL ER 500 MG PO TB24: ORAL_TABLET | ORAL | 4 refills | Status: DC

## 2018-06-20 MED ORDER — METOPROLOL SUCCINATE ER 100 MG PO TB24: ORAL_TABLET | ORAL | 4 refills | Status: DC

## 2018-06-20 MED ORDER — DICYCLOMINE HCL 20 MG PO TABS
ORAL_TABLET | ORAL | 4 refills | Status: DC
Start: 2018-06-20 — End: 2019-02-03

## 2018-06-20 NOTE — Telephone Encounter (Signed)
Refill request for dicyclomine, metoprolol 178m, metformin 5028m and accucheck lancets,  received via fax [facility] refilled per protocol.

## 2018-06-25 ENCOUNTER — Encounter: Payer: Self-pay | Admitting: Family

## 2018-06-30 ENCOUNTER — Emergency Department (HOSPITAL_BASED_OUTPATIENT_CLINIC_OR_DEPARTMENT_OTHER)
Admission: EM | Admit: 2018-06-30 | Discharge: 2018-06-30 | Disposition: A | Discharge status: Home / Self Care | Payer: Managed Care, Other (non HMO) | Attending: Emergency Medicine | Admitting: Emergency Medicine

## 2018-06-30 ENCOUNTER — Encounter (HOSPITAL_BASED_OUTPATIENT_CLINIC_OR_DEPARTMENT_OTHER): Payer: Self-pay | Admitting: Adult Health

## 2018-06-30 ENCOUNTER — Other Ambulatory Visit: Payer: Self-pay

## 2018-06-30 ENCOUNTER — Emergency Department (HOSPITAL_BASED_OUTPATIENT_CLINIC_OR_DEPARTMENT_OTHER): Payer: Managed Care, Other (non HMO)

## 2018-06-30 DIAGNOSIS — I10 Essential (primary) hypertension: Secondary | ICD-10-CM | POA: Insufficient documentation

## 2018-06-30 DIAGNOSIS — E119 Type 2 diabetes mellitus without complications: Secondary | ICD-10-CM | POA: Insufficient documentation

## 2018-06-30 DIAGNOSIS — R509 Fever, unspecified: Secondary | ICD-10-CM | POA: Insufficient documentation

## 2018-06-30 DIAGNOSIS — Z87891 Personal history of nicotine dependence: Secondary | ICD-10-CM | POA: Diagnosis not present

## 2018-06-30 DIAGNOSIS — R0981 Nasal congestion: Secondary | ICD-10-CM | POA: Diagnosis not present

## 2018-06-30 DIAGNOSIS — C220 Liver cell carcinoma: Secondary | ICD-10-CM | POA: Insufficient documentation

## 2018-06-30 DIAGNOSIS — Z7984 Long term (current) use of oral hypoglycemic drugs: Secondary | ICD-10-CM | POA: Diagnosis not present

## 2018-06-30 DIAGNOSIS — B349 Viral infection, unspecified: Secondary | ICD-10-CM

## 2018-06-30 DIAGNOSIS — R05 Cough: Secondary | ICD-10-CM | POA: Diagnosis not present

## 2018-06-30 DIAGNOSIS — Z79899 Other long term (current) drug therapy: Secondary | ICD-10-CM | POA: Insufficient documentation

## 2018-06-30 LAB — CBC WITH DIFFERENTIAL/PLATELET
Abs Immature Granulocytes: 0.03 10*3/uL (ref 0.00–0.07)
Basophils Absolute: 0.1 10*3/uL (ref 0.0–0.1)
Basophils Relative: 1 %
Eosinophils Absolute: 0.5 10*3/uL (ref 0.0–0.5)
Eosinophils Relative: 5 %
HCT: 41.4 % (ref 36.0–46.0)
Hemoglobin: 13.4 g/dL (ref 12.0–15.0)
Immature Granulocytes: 0 %
Lymphocytes Relative: 23 %
Lymphs Abs: 2.3 10*3/uL (ref 0.7–4.0)
MCH: 28.8 pg (ref 26.0–34.0)
MCHC: 32.4 g/dL (ref 30.0–36.0)
MCV: 89 fL (ref 80.0–100.0)
Monocytes Absolute: 1 10*3/uL (ref 0.1–1.0)
Monocytes Relative: 10 %
Neutro Abs: 6 10*3/uL (ref 1.7–7.7)
Neutrophils Relative %: 61 %
Platelets: 214 10*3/uL (ref 150–400)
RBC: 4.65 MIL/uL (ref 3.87–5.11)
RDW: 13.2 % (ref 11.5–15.5)
WBC: 9.9 10*3/uL (ref 4.0–10.5)
nRBC: 0 % (ref 0.0–0.2)

## 2018-06-30 LAB — COMPREHENSIVE METABOLIC PANEL
ALT: 76 U/L — ABNORMAL HIGH (ref 0–44)
AST: 48 U/L — ABNORMAL HIGH (ref 15–41)
Albumin: 3.8 g/dL (ref 3.5–5.0)
Alkaline Phosphatase: 99 U/L (ref 38–126)
Anion gap: 8 (ref 5–15)
BUN: 10 mg/dL (ref 6–20)
CO2: 26 mmol/L (ref 22–32)
Calcium: 9.1 mg/dL (ref 8.9–10.3)
Chloride: 103 mmol/L (ref 98–111)
Creatinine, Ser: 0.74 mg/dL (ref 0.44–1.00)
GFR calc Af Amer: >60 mL/min (ref >60)
GFR calc non Af Amer: >60 mL/min (ref >60)
Glucose, Bld: 104 mg/dL — ABNORMAL HIGH (ref 70–99)
Potassium: 3.9 mmol/L (ref 3.5–5.1)
Sodium: 137 mmol/L (ref 135–145)
Total Bilirubin: 0.5 mg/dL (ref 0.3–1.2)
Total Protein: 7.3 g/dL (ref 6.5–8.1)

## 2018-06-30 LAB — URINALYSIS, ROUTINE W REFLEX MICROSCOPIC
Bilirubin Urine: NEGATIVE
Glucose, UA: NEGATIVE mg/dL
Hgb urine dipstick: NEGATIVE
Ketones, ur: NEGATIVE mg/dL
Leukocytes, UA: NEGATIVE
Nitrite: NEGATIVE
Protein, ur: NEGATIVE mg/dL
Specific Gravity, Urine: 1.015 (ref 1.005–1.030)
pH: 6.5 (ref 5.0–8.0)

## 2018-06-30 LAB — PREGNANCY, URINE: Preg Test, Ur: NEGATIVE

## 2018-06-30 MED ORDER — SODIUM CHLORIDE 0.9 % IV BOLUS
1000.0000 mL | Freq: Once | INTRAVENOUS | Status: AC
  Administered 2018-06-30: 1000 mL via INTRAVENOUS

## 2018-06-30 MED ORDER — METOCLOPRAMIDE HCL 5 MG/ML IJ SOLN
10.0000 mg | Freq: Once | INTRAMUSCULAR | Status: AC
  Administered 2018-06-30: 10 mg via INTRAVENOUS
  Filled 2018-06-30: qty 2

## 2018-06-30 MED ORDER — ACETAMINOPHEN 325 MG PO TABS
650.0000 mg | ORAL_TABLET | Freq: Once | ORAL | Status: AC
  Administered 2018-06-30: 650 mg via ORAL
  Filled 2018-06-30: qty 2

## 2018-06-30 NOTE — Discharge Instructions (Signed)
Take over-the-counter cough medications as discussed.  Take Tylenol every 4-6 hours as needed for your fever.  Please see her doctor for recheck in 2-3 days.  Please return to the emergency department if you develop any new or worsening symptoms.  Make sure to drink plenty of fluids and get plenty of rest.

## 2018-06-30 NOTE — ED Triage Notes (Signed)
Cancer patient who was exposed to the flu a few weeks ago, she has been taking TAmiflu for the past 10 days. Wednesday she developed a migraine and then began feeling chills, fever of 100.0 , runny nose, cough with productive thick mucous, and generalized malaise.

## 2018-06-30 NOTE — ED Provider Notes (Signed)
[facility] EMERGENCY DEPARTMENT Provider Note   CSN: [id number] Arrival date & time: 06/30/18  1444     History   Chief Complaint Chief Complaint  Patient presents with  . Fever    HPI Stephanie Miles is a 37 y.o. female with history of liver cancer, diabetes, hypertension who presents with a 3-day history of cough, low-grade fever, and nasal congestion.  Patient reports she had a migraine headache the day before, however that has improved.  She denies neck pain.  She reports her fever has been as high as 100.  Patient reports she finished a 10-day course of prophylactic Tamiflu prior to onset of the symptoms, as she was exposed to her brother who had influenza B.  She denies any chest pain or shortness of breath.  She reports she has intermittent nausea related to her liver cancer, however nothing new.  She denies any vomiting or diarrhea.  She denies any urinary symptoms.  Patient has been taking Tylenol and over-the-counter cold medicine without relief.  HPI  Past Medical History:  Diagnosis Date  . Allergy   . Anxiety   . Aortic regurgitation due to bicuspid aortic valve   . Arthritis   . Borderline diabetes 10/20/2016  . Chronic kidney disease    kidney stones  . Depression   . Diabetes type 2, controlled ([place]) 10/20/2016  . Family history of breast cancer in female   . Family history of colon cancer   . Fatty liver 11/28/2016  . GERD (gastroesophageal reflux disease)   . Heart murmur   . Hypertension   . IBS (irritable bowel syndrome)   . Insomnia   . Liver cancer ([place]) 03/2017  . Migraine headache   . NASH (nonalcoholic steatohepatitis) 04/04/2017   Per biopsy 9/18  . OCD (obsessive compulsive disorder)   . Orbital cellulitis    at age 48 was hospitalized for 3 months (almost died)   . OSA (obstructive sleep apnea)   . S/P appy 05/2013  . Sleep apnea     Patient Active Problem List   Diagnosis Date Noted  . Right shoulder injury, subsequent encounter  02/01/2018  . Left leg injury, initial encounter 02/01/2018  . Hepatocellular carcinoma ([place]) 01/31/2018  . Vitamin D deficiency 11/01/2017  . Black stools 09/13/2017  . Liver lesion 06/20/2017  . Diarrhea 06/20/2017  . NASH (nonalcoholic steatohepatitis) 04/04/2017  . Iron deficiency anemia due to chronic blood loss 03/21/2017  . Genetic testing 02/28/2017  . Family history of breast cancer in female   . Family history of colon cancer   . Elevated LFTs 01/11/2017  . RUQ abdominal pain 01/11/2017  . PTSD (post-traumatic stress disorder) 12/28/2016  . Primary osteoarthritis of both knees 12/28/2016  . ANA positive 12/28/2016  . Leukocytosis 12/06/2016  . Fatty liver 11/28/2016  . History of kidney stones 10/21/2016  . Osteoarthritis 10/21/2016  . Diabetes type 2, controlled ([place]) 10/20/2016  . Cough variant asthma vs uacs  07/07/2015  . Severe obesity (BMI >= 40) ([place]) 07/07/2015  . Dyspnea 07/07/2015  . Asthma 05/27/2015  . Aortic regurgitation due to bicuspid aortic valve 03/12/2014  . Personal history of tobacco use, presenting hazards to health 01/20/2014  . Anxiety state 10/16/2013  . Migraine 07/08/2013  . Sleep apnea 07/08/2013  . IBS (irritable bowel syndrome) 06/06/2013  . GERD (gastroesophageal reflux disease) 06/06/2013  . HTN (hypertension) 06/06/2013    Past Surgical History:  Procedure Laterality Date  . COLONOSCOPY    .  DENTAL SURGERY     wisdom teeth  . ESOPHAGOGASTRODUODENOSCOPY    . LAPAROSCOPIC APPENDECTOMY N/A 06/06/2013   Procedure: APPENDECTOMY LAPAROSCOPIC;  Surgeon: Imogene Burn. Georgette Dover, MD;  Location: [facility];  Service: General;  Laterality: N/A;  . LIVER BIOPSY       OB History   None      Home Medications    Prior to Admission medications   Medication Sig Start Date End Date Taking? Authorizing Provider  acetaminophen (TYLENOL) 500 MG tablet Take 500 mg by mouth every 6 (six) hours as needed.   Yes [provider]    budesonide-formoterol (SYMBICORT) 160-4.5 MCG/ACT inhaler INHALE TWO PUFFS BY MOUTH TWICE A DAY 05/17/18  Yes Debbrah Alar, NP  cetirizine (ZYRTEC) 10 MG tablet Take 10 mg by mouth daily.   Yes [provider]  dicyclomine (BENTYL) 20 MG tablet TAKE ONE TABLET BY MOUTH FOUR TIMES A DAY BEFORE MEALS AND AT BEDTIME 06/20/18  Yes Debbrah Alar, NP  escitalopram (LEXAPRO) 20 MG tablet TAKE ONE TABLET BY MOUTH DAILY 01/02/18  Yes Debbrah Alar, NP  fluticasone (FLONASE) 50 MCG/ACT nasal spray SPRAY TWO SPRAYS IN THE BOTH NOSTRILS DAILY AS NEEDED FOR ALLERGIES 06/13/18  Yes Debbrah Alar, NP  furosemide (LASIX) 40 MG tablet Take 1 tablet (40 mg total) by mouth every morning. 03/27/18  Yes Debbrah Alar, NP  losartan (COZAAR) 100 MG tablet Take 1 tablet (100 mg total) by mouth daily. 05/17/18  Yes Debbrah Alar, NP  metFORMIN (GLUCOPHAGE-XR) 500 MG 24 hr tablet TAKE TWO TABLETS BY MOUTH DAILY WITH BREAKFAST 06/20/18  Yes Debbrah Alar, NP  metoprolol succinate (TOPROL-XL) 100 MG 24 hr tablet TAKE ONE TABLET BY MOUTH DAILY WITH OR IMMEDIATELY FOLLOWING A MEAL 06/20/18  Yes Debbrah Alar, NP  mirtazapine (REMERON) 15 MG tablet TAKE ONE TABLET BY MOUTH EVERY NIGHT AT BEDTIME 01/02/18  Yes Debbrah Alar, NP  montelukast (SINGULAIR) 10 MG tablet Take 1 tablet (10 mg total) by mouth at bedtime. 05/17/18  Yes Debbrah Alar, NP  omeprazole (PRILOSEC) 20 MG capsule TAKE 2 CAPSULES BY MOUTH EVERY MORNING BEFORE BREAKFAST AND 1 CAPSULE EVERY EVENING BEFORE SUPPER 02/05/18  Yes Debbrah Alar, NP  oseltamivir (TAMIFLU) 75 MG capsule Take 75 mg by mouth.   Yes [provider]  oxyCODONE (ROXICODONE) 5 MG immediate release tablet Take 1 tablet (5 mg total) by mouth every 4 (four) hours as needed for severe pain. 02/13/18  Yes Hudnall, Sharyn Lull, MD  Prenatal Vit-Fe Fumarate-FA (MULTIVITAMIN-PRENATAL) 27-0.8 MG TABS tablet Take 1 tablet by mouth  daily at 12 noon.   Yes [provider]  Vitamin D, Ergocalciferol, 2000 units CAPS Take 2,000 Units by mouth daily.   Yes [provider]  ACCU-CHEK FASTCLIX LANCETS MISC Check sugar twice daily 06/20/18   Debbrah Alar, NP  ALPRAZolam Duanne Moron) 0.5 MG tablet 1 tablet by mouth once daily as needed for anxiety 06/12/18   Colon Branch, MD  Blood Glucose Monitoring Suppl (ACCU-CHEK GUIDE) w/Device KIT 1 each by Does not apply route daily. 01/09/17   Debbrah Alar, NP  diclofenac sodium (VOLTAREN) 1 % GEL Apply 4 g topically 4 (four) times daily. 01/30/18   Hudnall, Sharyn Lull, MD  DOXORUBICIN HCL IV by Does not apply route.    [provider]  glucose blood (ACCU-CHEK GUIDE) test strip Use as instructed to check blood sugar twice day. 03/27/18   Debbrah Alar, NP  hydrocortisone 2.5 % cream Apply topically 2 (two) times daily. 06/12/18  06/12/19  Colon Branch, MD  hydrocortisone-pramoxine (PROCTOFOAM [facility]) rectal foam Place 1 applicator rectally 2 (two) times daily. Patient not taking: Reported on 06/12/2018 06/05/18   Debbrah Alar, NP  Multiple Vitamins-Minerals (MULTIVITAMIN WITH MINERALS) tablet Take 1 tablet by mouth daily.    [provider]  nitroGLYCERIN (NITRODUR - DOSED IN MG/24 HR) 0.2 mg/hr patch Apply 1/4th patch to affected shoulder, change daily 03/16/18   Hudnall, Sharyn Lull, MD  ondansetron (ZOFRAN ODT) 4 MG disintegrating tablet Take 1 tablet (4 mg total) by mouth every 8 (eight) hours as needed for nausea or vomiting. 01/31/18   Debbrah Alar, NP  potassium chloride (K-DUR,KLOR-CON) 10 MEQ tablet TAKE ONE TABLET BY MOUTH EVERY OTHER DAY 01/02/18   Debbrah Alar, NP  SUMAtriptan (IMITREX) 50 MG tablet Take 1 tablet (50 mg total) by mouth every 2 (two) hours as needed for migraine. May repeat in 2 hours if headache persists or recurs. 10/17/16   Debbrah Alar, NP    Family History Family History  Problem Relation Age of Onset    . Asthma Mother   . Hypertension Mother   . Breast cancer Father 74  . Diabetes Father   . Hypertension Father   . Heart disease Father   . Hyperlipidemia Father   . Asthma Sister   . Hyperlipidemia Brother   . Hypertension Brother   . Vision loss Brother   . Colon cancer Maternal Grandmother 58  . Liver cancer Maternal Grandmother   . Cervical cancer Maternal Aunt   . Stroke Paternal Uncle 57  . Melanoma Maternal Grandfather 40  . Colon polyps Maternal Grandfather   . AAA (abdominal aortic aneurysm) Paternal Grandmother   . Heart disease Paternal Grandfather   . Heart disease Paternal Uncle 79  . Colon cancer Other 26       MGM's mother  . Stomach cancer Other 45       MGM's mother  . Breast cancer Other        MGM's maternal aunt  . Cancer Other        MGM's maternal uncle with GI cancer  . Esophageal cancer Neg Hx   . Pancreatic cancer Neg Hx   . Rectal cancer Neg Hx     Social History Social History   Tobacco Use  . Smoking status: Former Smoker    Packs/day: 1.00    Years: 20.00    Pack years: 20.00    Types: Cigarettes    Last attempt to quit: 02/22/2013    Years since quitting: 5.3  . Smokeless tobacco: Never Used  Substance Use Topics  . Alcohol use: Never    Alcohol/week: 0.0 standard drinks    Frequency: Never  . Drug use: No     Allergies   Codeine; Amlodipine; Dust mite extract; Mold extract [trichophyton]; and Tape   Review of Systems Review of Systems  Constitutional: Positive for fever. Negative for chills.  HENT: Positive for congestion. Negative for ear pain, facial swelling and sore throat.   Respiratory: Positive for cough. Negative for shortness of breath.   Cardiovascular: Negative for chest pain.  Gastrointestinal: Positive for nausea (baseline). Negative for abdominal pain and vomiting.  Genitourinary: Negative for dysuria.  Musculoskeletal: Negative for back pain.  Skin: Negative for rash and wound.  Neurological: Positive for  headaches.  Psychiatric/Behavioral: The patient is not nervous/anxious.      Physical Exam Updated Vital Signs BP 127/76 (BP Location: Right Arm)   Pulse 82  Temp 98.3 F (36.8 C) (Oral)   Resp 16   SpO2 92%   Physical Exam  Constitutional: She appears well-developed and well-nourished. No distress.  HENT:  Head: Normocephalic and atraumatic.  Mouth/Throat: Oropharynx is clear and moist. No oropharyngeal exudate.  Eyes: Pupils are equal, round, and reactive to light. Conjunctivae are normal. Right eye exhibits no discharge. Left eye exhibits no discharge. No scleral icterus.  Neck: Normal range of motion. Neck supple. No spinous process tenderness and no muscular tenderness present. No neck rigidity. Normal range of motion present. No thyromegaly present.  Chin to chest without difficulty  Cardiovascular: Normal rate, regular rhythm, normal heart sounds and intact distal pulses. Exam reveals no gallop and no friction rub.  No murmur heard. Pulmonary/Chest: Effort normal and breath sounds normal. No stridor. No respiratory distress. She has no wheezes. She has no rales.  Abdominal: Soft. Bowel sounds are normal. She exhibits no distension. There is no tenderness. There is no rebound and no guarding.  Musculoskeletal: She exhibits no edema.  Lymphadenopathy:    She has no cervical adenopathy.  Neurological: She is alert. Coordination normal.  Skin: Skin is warm and dry. No rash noted. She is not diaphoretic. No pallor.  Psychiatric: She has a normal mood and affect.  Nursing note and vitals reviewed.    ED Treatments / Results  Labs (all labs ordered are listed, but only abnormal results are displayed) Labs Reviewed  COMPREHENSIVE METABOLIC PANEL - Abnormal; Notable for the following components:      Result Value   Glucose, Bld 104 (*)    AST 48 (*)    ALT 76 (*)    All other components within normal limits  CBC WITH DIFFERENTIAL/PLATELET  URINALYSIS, ROUTINE W REFLEX  MICROSCOPIC  PREGNANCY, URINE    EKG None  Radiology Dg Chest 2 View  Result Date: 06/30/2018 CLINICAL DATA:  Cough, fever EXAM: CHEST - 2 VIEW COMPARISON:  06/12/2018 FINDINGS: The heart size and mediastinal contours are within normal limits. Both lungs are clear. The visualized skeletal structures are unremarkable. IMPRESSION: No active cardiopulmonary disease. Electronically Signed   By: Kathreen Devoid   On: 06/30/2018 15:57    Procedures Procedures (including critical care time)  Medications Ordered in ED Medications  sodium chloride 0.9 % bolus 1,000 mL (0 mLs Intravenous Stopped 06/30/18 1851)  metoCLOPramide (REGLAN) injection 10 mg (10 mg Intravenous Given 06/30/18 1700)  acetaminophen (TYLENOL) tablet 650 mg (650 mg Oral Given 06/30/18 1646)     Initial Impression / Assessment and Plan / ED Course  I have reviewed the triage vital signs and the nursing notes.  Pertinent labs & imaging results that were available during my care of the patient were reviewed by me and considered in my medical decision making (see chart for details).     Patient presenting with probable viral syndrome.  Labs are unremarkable except for mild elevation of LFTs, which are stable for the patient.  Urine is clear.  Chest x-ray is clear.  Patient feeling better after fluids and Reglan and Tylenol.  Suspect viral upper respiratory illness.  Flu and likely had a fever over 100.  She also has a 10-day course of Tamiflu prior.  Patient to follow-up with her PCP in 2 to 3 days.  Supportive treatment discussed.  Strict return precautions given.  Patient understands and agrees with plan.  Patient vitals stable throughout ED course and discharged in satisfactory condition. I discussed patient case with Dr. Johnney Killian who guided  the patient's management and agrees with plan.   Final Clinical Impressions(s) / ED Diagnoses   Final diagnoses:  Viral illness    ED Discharge Orders    None       Frederica Kuster, PA-C 06/30/18 1918    Charlesetta Shanks, MD 07/05/18 [phone]

## 2018-06-30 NOTE — ED Notes (Signed)
ED Provider at bedside. 

## 2018-07-04 ENCOUNTER — Ambulatory Visit (INDEPENDENT_AMBULATORY_CARE_PROVIDER_SITE_OTHER): Payer: Managed Care, Other (non HMO) | Admitting: Family

## 2018-07-04 VITALS — BP 142/92 | HR 80 | Temp 99.1°F | Resp 16 | Ht 66.0 in | Wt 277.0 lb

## 2018-07-04 DIAGNOSIS — G4700 Insomnia, unspecified: Secondary | ICD-10-CM

## 2018-07-04 DIAGNOSIS — J019 Acute sinusitis, unspecified: Secondary | ICD-10-CM

## 2018-07-04 MED ORDER — AMOXICILLIN-POT CLAVULANATE 875-125 MG PO TABS: 1.0000 | ORAL_TABLET | Freq: Two times a day (BID) | ORAL | 0 refills | Status: DC

## 2018-07-04 MED ORDER — PREDNISONE 10 MG PO TABS: ORAL_TABLET | ORAL | 0 refills | Status: DC

## 2018-07-04 MED ORDER — ALPRAZOLAM 0.5 MG PO TABS: ORAL_TABLET | ORAL | 2 refills | Status: DC

## 2018-07-04 NOTE — Progress Notes (Signed)
Subjective:    Patient ID: Stephanie Miles, female    DOB: 1981/06/25, 37 y.o.   MRN: [id number]  HPI  Patient is a 37 yr old female who presents today with chief complaint of nasal congestion. Symptoms started on 06/27/18.  Reports that she just finished a 10 day course of tamiflu due to exposure to flu. Had migraine last Wednesday.  That evening she developed a mild cough. Thursday had cough- still went to work. Friday developed yellow nasal discharge. She reports that she has had a t max of 100 on Friday. Went to the ER on Saturday.    Review of Systems See HPI  Past Medical History:  Diagnosis Date  . Allergy   . Anxiety   . Aortic regurgitation due to bicuspid aortic valve   . Arthritis   . Borderline diabetes 10/20/2016  . Chronic kidney disease    kidney stones  . Depression   . Diabetes type 2, controlled ([place]) 10/20/2016  . Family history of breast cancer in female   . Family history of colon cancer   . Fatty liver 11/28/2016  . GERD (gastroesophageal reflux disease)   . Heart murmur   . Hypertension   . IBS (irritable bowel syndrome)   . Insomnia   . Liver cancer ([place]) 03/2017  . Migraine headache   . NASH (nonalcoholic steatohepatitis) 04/04/2017   Per biopsy 9/18  . OCD (obsessive compulsive disorder)   . Orbital cellulitis    at age 11 was hospitalized for 3 months (almost died)   . OSA (obstructive sleep apnea)   . S/P appy 05/2013  . Sleep apnea      Social History   Socioeconomic History  . Marital status: Single    Spouse name: Not on file  . Number of children: 0  . Years of education: Not on file  . Highest education level: Not on file  Occupational History  . Occupation: Ship broker  . Occupation: Programmer, applications  Social Needs  . Financial resource strain: Not on file  . Food insecurity:    Worry: Not on file    Inability: Not on file  . Transportation needs:    Medical: Not on file    Non-medical: Not on file  Tobacco Use  . Smoking status:  Former Smoker    Packs/day: 1.00    Years: 20.00    Pack years: 20.00    Types: Cigarettes    Last attempt to quit: 02/22/2013    Years since quitting: 5.3  . Smokeless tobacco: Never Used  Substance and Sexual Activity  . Alcohol use: Never    Alcohol/week: 0.0 standard drinks    Frequency: Never  . Drug use: No  . Sexual activity: Not Currently  Lifestyle  . Physical activity:    Days per week: Not on file    Minutes per session: Not on file  . Stress: Not on file  Relationships  . Social connections:    Talks on phone: Not on file    Gets together: Not on file    Attends religious service: Not on file    Active member of club or organization: Not on file    Attends meetings of clubs or organizations: Not on file    Relationship status: Not on file  . Intimate partner violence:    Fear of current or ex partner: Not on file    Emotionally abused: Not on file    Physically abused: Not on file  Forced sexual activity: Not on file  Other Topics Concern  . Not on file  Social History Narrative   Working at [employer] doing [employer] alone (has a Neurosurgeon)   Working on a degree at [employer], will plan to return fall 2018   Enjoys spending time with family.   Friends and family in [place]    Past Surgical History:  Procedure Laterality Date  . COLONOSCOPY    . DENTAL SURGERY     wisdom teeth  . ESOPHAGOGASTRODUODENOSCOPY    . LAPAROSCOPIC APPENDECTOMY N/A 06/06/2013   Procedure: APPENDECTOMY LAPAROSCOPIC;  Surgeon: Imogene Burn. Georgette Dover, MD;  Location: [facility];  Service: General;  Laterality: N/A;  . LIVER BIOPSY      Family History  Problem Relation Age of Onset  . Asthma Mother   . Hypertension Mother   . Breast cancer Father 39  . Diabetes Father   . Hypertension Father   . Heart disease Father   . Hyperlipidemia Father   . Asthma Sister   . Hyperlipidemia Brother   . Hypertension Brother   . Vision loss Brother   . Colon cancer Maternal Grandmother 38   . Liver cancer Maternal Grandmother   . Cervical cancer Maternal Aunt   . Stroke Paternal Uncle 41  . Melanoma Maternal Grandfather 68  . Colon polyps Maternal Grandfather   . AAA (abdominal aortic aneurysm) Paternal Grandmother   . Heart disease Paternal Grandfather   . Heart disease Paternal Uncle 33  . Colon cancer Other 35       MGM's mother  . Stomach cancer Other 63       MGM's mother  . Breast cancer Other        MGM's maternal aunt  . Cancer Other        MGM's maternal uncle with GI cancer  . Esophageal cancer Neg Hx   . Pancreatic cancer Neg Hx   . Rectal cancer Neg Hx     Allergies  Allergen Reactions  . Codeine Nausea Only    Needs antiemetic Needs antiemetic  . Amlodipine Other (See Comments)    EDEMA EDEMA  . Dust Mite Extract   . Mold Extract [Trichophyton]   . Tape Rash    Tapes, bandaids Tapes, bandaids    Current Outpatient Medications on File Prior to Visit  Medication Sig Dispense Refill  . ACCU-CHEK FASTCLIX LANCETS MISC Check sugar twice daily 102 each 3  . acetaminophen (TYLENOL) 500 MG tablet Take 500 mg by mouth every 6 (six) hours as needed.    . ALPRAZolam (XANAX) 0.5 MG tablet 1 tablet by mouth once daily as needed for anxiety 30 tablet 0  . Blood Glucose Monitoring Suppl (ACCU-CHEK GUIDE) w/Device KIT 1 each by Does not apply route daily. 1 kit 0  . budesonide-formoterol (SYMBICORT) 160-4.5 MCG/ACT inhaler INHALE TWO PUFFS BY MOUTH TWICE A DAY 1 Inhaler 2  . cetirizine (ZYRTEC) 10 MG tablet Take 10 mg by mouth daily.    . diclofenac sodium (VOLTAREN) 1 % GEL Apply 4 g topically 4 (four) times daily. 3 Tube 2  . dicyclomine (BENTYL) 20 MG tablet TAKE ONE TABLET BY MOUTH FOUR TIMES A DAY BEFORE MEALS AND AT BEDTIME 120 tablet 4  . DOXORUBICIN HCL IV by Does not apply route.    Marland Kitchen escitalopram (LEXAPRO) 20 MG tablet TAKE ONE TABLET BY MOUTH DAILY 30 tablet 5  . fluticasone (FLONASE) 50 MCG/ACT nasal spray SPRAY TWO SPRAYS IN  THE BOTH NOSTRILS  DAILY AS NEEDED FOR ALLERGIES 16 g 5  . furosemide (LASIX) 40 MG tablet Take 1 tablet (40 mg total) by mouth every morning. 90 tablet 1  . glucose blood (ACCU-CHEK GUIDE) test strip Use as instructed to check blood sugar twice day. 100 each 1  . hydrocortisone 2.5 % cream Apply topically 2 (two) times daily. 30 g 0  . hydrocortisone-pramoxine (PROCTOFOAM [facility]) rectal foam Place 1 applicator rectally 2 (two) times daily. (Patient not taking: Reported on 06/12/2018) 10 g 0  . losartan (COZAAR) 100 MG tablet Take 1 tablet (100 mg total) by mouth daily. 30 tablet 5  . metFORMIN (GLUCOPHAGE-XR) 500 MG 24 hr tablet TAKE TWO TABLETS BY MOUTH DAILY WITH BREAKFAST 60 tablet 4  . metoprolol succinate (TOPROL-XL) 100 MG 24 hr tablet TAKE ONE TABLET BY MOUTH DAILY WITH OR IMMEDIATELY FOLLOWING A MEAL 30 tablet 4  . mirtazapine (REMERON) 15 MG tablet TAKE ONE TABLET BY MOUTH EVERY NIGHT AT BEDTIME 30 tablet 5  . montelukast (SINGULAIR) 10 MG tablet Take 1 tablet (10 mg total) by mouth at bedtime. 30 tablet 4  . Multiple Vitamins-Minerals (MULTIVITAMIN WITH MINERALS) tablet Take 1 tablet by mouth daily.    . nitroGLYCERIN (NITRODUR - DOSED IN MG/24 HR) 0.2 mg/hr patch Apply 1/4th patch to affected shoulder, change daily 30 patch 1  . omeprazole (PRILOSEC) 20 MG capsule TAKE 2 CAPSULES BY MOUTH EVERY MORNING BEFORE BREAKFAST AND 1 CAPSULE EVERY EVENING BEFORE SUPPER 90 capsule 5  . ondansetron (ZOFRAN ODT) 4 MG disintegrating tablet Take 1 tablet (4 mg total) by mouth every 8 (eight) hours as needed for nausea or vomiting. 30 tablet 0  . oseltamivir (TAMIFLU) 75 MG capsule Take 75 mg by mouth.    . oxyCODONE (ROXICODONE) 5 MG immediate release tablet Take 1 tablet (5 mg total) by mouth every 4 (four) hours as needed for severe pain. 30 tablet 0  . potassium chloride (K-DUR,KLOR-CON) 10 MEQ tablet TAKE ONE TABLET BY MOUTH EVERY OTHER DAY 15 tablet 5  . Prenatal Vit-Fe Fumarate-FA (MULTIVITAMIN-PRENATAL) 27-0.8 MG  TABS tablet Take 1 tablet by mouth daily at 12 noon.    . SUMAtriptan (IMITREX) 50 MG tablet Take 1 tablet (50 mg total) by mouth every 2 (two) hours as needed for migraine. May repeat in 2 hours if headache persists or recurs. 10 tablet 0  . Vitamin D, Ergocalciferol, 2000 units CAPS Take 2,000 Units by mouth daily.     No current facility-administered medications on file prior to visit.     BP (!) 142/92 (BP Location: Right Arm, Patient Position: Sitting, Cuff Size: Large)   Pulse 80   Temp 99.1 F (37.3 C) (Oral)   Resp 16   Ht 5' 6"  (1.676 m)   Wt 277 lb (125.6 kg)   SpO2 98%   BMI 44.71 kg/m       Objective:   Physical Exam  Constitutional: She is oriented to person, place, and time. She appears well-developed and well-nourished.  HENT:  Right Ear: External ear and ear canal normal. Tympanic membrane is retracted. Tympanic membrane is not erythematous and not bulging.  Left Ear: Tympanic membrane, external ear and ear canal normal.  Nose: Right sinus exhibits maxillary sinus tenderness and frontal sinus tenderness. Left sinus exhibits maxillary sinus tenderness and frontal sinus tenderness.  Mouth/Throat: No oropharyngeal exudate, posterior oropharyngeal edema or posterior oropharyngeal erythema.  Neck: Neck supple. No thyromegaly present.  Cardiovascular: Normal rate, regular rhythm and  normal heart sounds.  No murmur heard. Pulmonary/Chest: Effort normal and breath sounds normal. No respiratory distress. She has no wheezes.  Neurological: She is alert and oriented to person, place, and time.  Skin: Skin is warm and dry.  Psychiatric: She has a normal mood and affect. Her behavior is normal. Judgment and thought content normal.          Assessment & Plan:  Sinusitis- advised pt to begin augmentin. She is requesting course of steroids as she has needed historically with sinus infections.  Insomnia- weight loss MD is wanting her to stop remeron. Will d/c and instead  plan xanax hs prn.

## 2018-07-04 NOTE — Patient Instructions (Signed)
Begin prednisone and augmentin for sinus infection. Stop remeron, you may take a tablet of xanax at bedtime as needed for sleep. Call if symptoms worsen or if not improved in 3-4 days.

## 2018-07-21 ENCOUNTER — Encounter: Payer: Self-pay | Admitting: Family

## 2018-07-26 MED ORDER — POTASSIUM CHLORIDE CRYS ER 10 MEQ PO TBCR: 10.0000 meq | EXTENDED_RELEASE_TABLET | ORAL | 5 refills | Status: DC

## 2018-07-26 MED ORDER — ESCITALOPRAM OXALATE 20 MG PO TABS: 20.0000 mg | ORAL_TABLET | Freq: Every day | ORAL | 5 refills | Status: DC

## 2018-07-26 NOTE — Addendum Note (Signed)
Addended by: Kelle Darting A on: 07/26/2018 10:04 AM   Modules accepted: Orders

## 2018-07-30 ENCOUNTER — Emergency Department (HOSPITAL_COMMUNITY): Payer: Managed Care, Other (non HMO)

## 2018-07-30 ENCOUNTER — Other Ambulatory Visit: Payer: Self-pay

## 2018-07-30 ENCOUNTER — Emergency Department (HOSPITAL_COMMUNITY)
Admission: EM | Admit: 2018-07-30 | Discharge: 2018-07-31 | Disposition: A | Discharge status: Home / Self Care | Payer: Managed Care, Other (non HMO) | Attending: Emergency Medicine | Admitting: Emergency Medicine

## 2018-07-30 ENCOUNTER — Encounter (HOSPITAL_COMMUNITY): Payer: Self-pay | Admitting: Emergency Medicine

## 2018-07-30 DIAGNOSIS — I10 Essential (primary) hypertension: Secondary | ICD-10-CM | POA: Insufficient documentation

## 2018-07-30 DIAGNOSIS — Z79899 Other long term (current) drug therapy: Secondary | ICD-10-CM | POA: Diagnosis not present

## 2018-07-30 DIAGNOSIS — E119 Type 2 diabetes mellitus without complications: Secondary | ICD-10-CM | POA: Diagnosis not present

## 2018-07-30 DIAGNOSIS — J9801 Acute bronchospasm: Secondary | ICD-10-CM | POA: Insufficient documentation

## 2018-07-30 DIAGNOSIS — R079 Chest pain, unspecified: Secondary | ICD-10-CM | POA: Diagnosis present

## 2018-07-30 DIAGNOSIS — Z87891 Personal history of nicotine dependence: Secondary | ICD-10-CM | POA: Diagnosis not present

## 2018-07-30 LAB — BASIC METABOLIC PANEL
Anion gap: 9 (ref 5–15)
BUN: 9 mg/dL (ref 6–20)
CO2: 25 mmol/L (ref 22–32)
Calcium: 9.2 mg/dL (ref 8.9–10.3)
Chloride: 104 mmol/L (ref 98–111)
Creatinine, Ser: 0.83 mg/dL (ref 0.44–1.00)
GFR calc Af Amer: >60 mL/min (ref >60)
GFR calc non Af Amer: >60 mL/min (ref >60)
Glucose, Bld: 97 mg/dL (ref 70–99)
Potassium: 4 mmol/L (ref 3.5–5.1)
Sodium: 138 mmol/L (ref 135–145)

## 2018-07-30 LAB — CBC
HCT: 40.8 % (ref 36.0–46.0)
Hemoglobin: 13.1 g/dL (ref 12.0–15.0)
MCH: 28.4 pg (ref 26.0–34.0)
MCHC: 32.1 g/dL (ref 30.0–36.0)
MCV: 88.3 fL (ref 80.0–100.0)
Platelets: 198 10*3/uL (ref 150–400)
RBC: 4.62 MIL/uL (ref 3.87–5.11)
RDW: 13.1 % (ref 11.5–15.5)
WBC: 6.3 10*3/uL (ref 4.0–10.5)
nRBC: 0 % (ref 0.0–0.2)

## 2018-07-30 LAB — I-STAT TROPONIN, ED: Troponin i, poc: 0.01 ng/mL (ref 0.00–0.08)

## 2018-07-30 LAB — I-STAT BETA HCG BLOOD, ED (MC, WL, AP ONLY): I-stat hCG, quantitative: <5 m[IU]/mL (ref <5)

## 2018-07-30 MED ORDER — ONDANSETRON HCL 4 MG/2ML IJ SOLN
4.0000 mg | Freq: Once | INTRAMUSCULAR | Status: AC
  Administered 2018-07-30: 4 mg via INTRAVENOUS
  Filled 2018-07-30: qty 2

## 2018-07-30 MED ORDER — MORPHINE SULFATE (PF) 4 MG/ML IV SOLN
4.0000 mg | Freq: Once | INTRAVENOUS | Status: AC
  Administered 2018-07-30: 4 mg via INTRAVENOUS
  Filled 2018-07-30: qty 1

## 2018-07-30 MED ORDER — ALBUTEROL SULFATE (2.5 MG/3ML) 0.083% IN NEBU
5.0000 mg | INHALATION_SOLUTION | Freq: Once | RESPIRATORY_TRACT | Status: AC
  Administered 2018-07-30: 5 mg via RESPIRATORY_TRACT
  Filled 2018-07-30: qty 6

## 2018-07-30 NOTE — ED Provider Notes (Signed)
TIME SEEN: 11:17 PM  CHIEF COMPLAINT: Chest pain  HPI: Patient is a 38 year old female with history of obesity, hypertension, hyperlipidemia, diabetes, tobacco use who quit 5 years ago, hepatocellular carcinoma currently followed by [facility] transplant surgery who presents to the emergency department with chest pain.  States at 4:30 PM while working she started having diffuse chest heaviness that radiated into her left jaw, left shoulder.  She felt short of breath, dizzy and nauseated.  Dizziness was worse with standing and chest pain was worse with leaning forward.  No diaphoresis.  Given morphine with EMS and pain has improved.  Still having some mild chest heaviness but now also having a headache.  Reports for the past few days she has had nonproductive cough.  No fever.  No history of PE or DVT.  No lower extremity swelling or pain.  Does have a family history of CAD.  Reports multiple grandparents with CAD in father who died of an MI at 74 years old.  She denies any history of stress test or cardiac catheterization.  She is not receiving chemotherapy at this time.  ROS: See HPI Constitutional: no fever  Eyes: no drainage  ENT: no runny nose   Cardiovascular:   chest pain  Resp:  SOB  GI: no vomiting GU: no dysuria Integumentary: no rash  Allergy: no hives  Musculoskeletal: no leg swelling  Neurological: no slurred speech ROS otherwise negative  PAST MEDICAL HISTORY/PAST SURGICAL HISTORY:  Past Medical History:  Diagnosis Date  . Allergy   . Anxiety   . Aortic regurgitation due to bicuspid aortic valve   . Arthritis   . Borderline diabetes 10/20/2016  . Chronic kidney disease    kidney stones  . Depression   . Diabetes type 2, controlled ([place]) 10/20/2016  . Family history of breast cancer in female   . Family history of colon cancer   . Fatty liver 11/28/2016  . GERD (gastroesophageal reflux disease)   . Heart murmur   . Hypertension   . IBS (irritable bowel syndrome)   . Insomnia    . Liver cancer ([place]) 03/2017  . Migraine headache   . NASH (nonalcoholic steatohepatitis) 04/04/2017   Per biopsy 9/18  . OCD (obsessive compulsive disorder)   . Orbital cellulitis    at age 5 was hospitalized for 3 months (almost died)   . OSA (obstructive sleep apnea)   . S/P appy 05/2013  . Sleep apnea     MEDICATIONS:  Prior to Admission medications   Medication Sig Start Date End Date Taking? Authorizing Provider  acetaminophen (TYLENOL) 500 MG tablet Take 500-1,000 mg by mouth every 6 (six) hours as needed (for pain).    Yes [provider]  albuterol (VENTOLIN HFA) 108 (90 Base) MCG/ACT inhaler Inhale 2 puffs into the lungs every 6 (six) hours as needed for wheezing or shortness of breath.   Yes [provider]  ALPRAZolam Duanne Moron) 0.5 MG tablet 1 tablet by mouth twice daily as needed Patient taking differently: Take 0.5 mg by mouth See admin instructions. Take 0.5 mg by mouth at bedtime and an additional 0.5 mg during the day, as needed for anxiety 07/04/18  Yes Debbrah Alar, NP  budesonide-formoterol (SYMBICORT) 160-4.5 MCG/ACT inhaler INHALE TWO PUFFS BY MOUTH TWICE A DAY Patient taking differently: Inhale 2 puffs into the lungs 2 (two) times daily.  05/17/18  Yes Debbrah Alar, NP  cetirizine (ZYRTEC) 10 MG tablet Take 10 mg by mouth daily.   Yes [provider]  Cholecalciferol (VITAMIN D3) 50 MCG (2000 UT) TABS Take 2,000 Units by mouth daily.   Yes [provider]  dicyclomine (BENTYL) 20 MG tablet TAKE ONE TABLET BY MOUTH FOUR TIMES A DAY BEFORE MEALS AND AT BEDTIME Patient taking differently: Take 20 mg by mouth See admin instructions. Take 20 mg by mouth two to three times a day- before meals and at bedtime 06/20/18  Yes Debbrah Alar, NP  DOXORUBICIN HCL IV Inject into the vein See admin instructions.    Yes [provider]  escitalopram (LEXAPRO) 20 MG tablet Take 1 tablet (20 mg total) by mouth daily. 07/26/18   Yes Debbrah Alar, NP  fluticasone (FLONASE) 50 MCG/ACT nasal spray SPRAY TWO SPRAYS IN THE BOTH NOSTRILS DAILY AS NEEDED FOR ALLERGIES Patient taking differently: Place 2 sprays into both nostrils daily.  06/13/18  Yes Debbrah Alar, NP  furosemide (LASIX) 40 MG tablet Take 1 tablet (40 mg total) by mouth every morning. 03/27/18  Yes Debbrah Alar, NP  hydrocortisone-pramoxine (PROCTOFOAM HC) rectal foam Place 1 applicator rectally 2 (two) times daily. Patient taking differently: Place 1 applicator rectally 2 (two) times daily as needed for hemorrhoids or anal itching.  06/05/18  Yes Debbrah Alar, NP  losartan (COZAAR) 100 MG tablet Take 1 tablet (100 mg total) by mouth daily. 05/17/18  Yes Debbrah Alar, NP  metFORMIN (GLUCOPHAGE-XR) 500 MG 24 hr tablet TAKE TWO TABLETS BY MOUTH DAILY WITH BREAKFAST Patient taking differently: Take 1,000 mg by mouth daily with breakfast.  06/20/18  Yes Debbrah Alar, NP  metoprolol succinate (TOPROL-XL) 100 MG 24 hr tablet TAKE ONE TABLET BY MOUTH DAILY WITH OR IMMEDIATELY FOLLOWING A MEAL Patient taking differently: Take 100 mg by mouth daily. WITH OR IMMEDIATELY FOLLOWING A MEAL 06/20/18  Yes Debbrah Alar, NP  montelukast (SINGULAIR) 10 MG tablet Take 1 tablet (10 mg total) by mouth at bedtime. 05/17/18  Yes Debbrah Alar, NP  Multiple Vitamins-Minerals (MULTIVITAMIN WITH MINERALS) tablet Take 1 tablet by mouth daily.   Yes [provider]  omeprazole (PRILOSEC) 20 MG capsule TAKE 2 CAPSULES BY MOUTH EVERY MORNING BEFORE BREAKFAST AND 1 CAPSULE EVERY EVENING BEFORE SUPPER Patient taking differently: Take 20-40 mg by mouth See admin instructions. Take 40 mg by mouth in the morning before breakfast and 20 mg in the evening before supper 02/05/18  Yes Debbrah Alar, NP  ondansetron (ZOFRAN ODT) 4 MG disintegrating tablet Take 1 tablet (4 mg total) by mouth every 8 (eight) hours as needed for nausea or  vomiting. 01/31/18  Yes Debbrah Alar, NP  oxyCODONE (ROXICODONE) 5 MG immediate release tablet Take 1 tablet (5 mg total) by mouth every 4 (four) hours as needed for severe pain. 02/13/18  Yes Hudnall, Sharyn Lull, MD  potassium chloride (K-DUR,KLOR-CON) 10 MEQ tablet Take 1 tablet (10 mEq total) by mouth every other day. Patient taking differently: Take 10 mEq by mouth See admin instructions. Take 10 mEq by mouth at bedtime on even-numbered nights 07/26/18  Yes Debbrah Alar, NP  Prenatal Vit-Fe Fumarate-FA (MULTIVITAMIN-PRENATAL) 27-0.8 MG TABS tablet Take 1 tablet by mouth daily.    Yes [provider]  SUMAtriptan (IMITREX) 50 MG tablet Take 1 tablet (50 mg total) by mouth every 2 (two) hours as needed for migraine. May repeat in 2 hours if headache persists or recurs. 10/17/16  Yes Debbrah Alar, NP  ACCU-CHEK FASTCLIX LANCETS MISC Check sugar twice daily 06/20/18   Debbrah Alar, NP  amoxicillin-clavulanate (AUGMENTIN) 875-125 MG tablet Take  1 tablet by mouth 2 (two) times daily. Patient not taking: Reported on 07/30/2018 07/04/18   Debbrah Alar, NP  Blood Glucose Monitoring Suppl (ACCU-CHEK GUIDE) w/Device KIT 1 each by Does not apply route daily. 01/09/17   Debbrah Alar, NP  diclofenac sodium (VOLTAREN) 1 % GEL Apply 4 g topically 4 (four) times daily. Patient not taking: Reported on 07/30/2018 01/30/18   Dene Gentry, MD  glucose blood (ACCU-CHEK GUIDE) test strip Use as instructed to check blood sugar twice day. 03/27/18   Debbrah Alar, NP  hydrocortisone 2.5 % cream Apply topically 2 (two) times daily. Patient not taking: Reported on 07/30/2018 06/12/18 06/12/19  Colon Branch, MD  nitroGLYCERIN (NITRODUR - DOSED IN MG/24 HR) 0.2 mg/hr patch Apply 1/4th patch to affected shoulder, change daily Patient not taking: Reported on 07/30/2018 03/16/18   Dene Gentry, MD  predniSONE (DELTASONE) 10 MG tablet 4 tabs by mouth once daily for 4 days Patient not  taking: Reported on 07/30/2018 07/04/18   Debbrah Alar, NP    ALLERGIES:  Allergies  Allergen Reactions  . Dust Mite Extract Shortness Of Breath and Cough  . Mold Extract [Trichophyton] Shortness Of Breath and Cough  . Nsaids Other (See Comments)    Patient is to NOT have NSAIDS  . Codeine Nausea Only    Required use of an antiemetic   . Amlodipine Swelling and Other (See Comments)    Edema   . Tape Rash    Medical tape and Band-Aids aren't tolerated    SOCIAL HISTORY:  Social History   Tobacco Use  . Smoking status: Former Smoker    Packs/day: 1.00    Years: 20.00    Pack years: 20.00    Types: Cigarettes    Last attempt to quit: 02/22/2013    Years since quitting: 5.4  . Smokeless tobacco: Never Used  Substance Use Topics  . Alcohol use: Never    Alcohol/week: 0.0 standard drinks    Frequency: Never    FAMILY HISTORY: Family History  Problem Relation Age of Onset  . Asthma Mother   . Hypertension Mother   . Breast cancer Father 61  . Diabetes Father   . Hypertension Father   . Heart disease Father   . Hyperlipidemia Father   . Asthma Sister   . Hyperlipidemia Brother   . Hypertension Brother   . Vision loss Brother   . Colon cancer Maternal Grandmother 84  . Liver cancer Maternal Grandmother   . Cervical cancer Maternal Aunt   . Stroke Paternal Uncle 12  . Melanoma Maternal Grandfather 80  . Colon polyps Maternal Grandfather   . AAA (abdominal aortic aneurysm) Paternal Grandmother   . Heart disease Paternal Grandfather   . Heart disease Paternal Uncle 15  . Colon cancer Other 47       MGM's mother  . Stomach cancer Other 87       MGM's mother  . Breast cancer Other        MGM's maternal aunt  . Cancer Other        MGM's maternal uncle with GI cancer  . Esophageal cancer Neg Hx   . Pancreatic cancer Neg Hx   . Rectal cancer Neg Hx     EXAM: BP (!) 146/83   Pulse 66   Temp 98.1 F (36.7 C) (Oral)   Resp 16   Ht _0  (1.676 m)   Wt  120.7 kg   LMP 07/18/2018   SpO2  98%   BMI 42.93 kg/m  CONSTITUTIONAL: Alert and oriented and responds appropriately to questions. Well-appearing; well-nourished, obese HEAD: Normocephalic EYES: Conjunctivae clear, pupils appear equal, EOMI ENT: normal nose; moist mucous membranes NECK: Supple, no meningismus, no nuchal rigidity, no LAD  CARD: RRR; S1 and S2 appreciated; no murmurs, no clicks, no rubs, no gallops CHEST:  Chest wall is nontender to palpation but reports palpation increases the "heaviness".  No crepitus, ecchymosis, erythema, warmth, rash or other lesions present.   RESP: Normal chest excursion without splinting or tachypnea; breath sounds equal bilaterally, diffuse expiratory wheezes on examination, no rhonchi, no rales, no hypoxia or respiratory distress, speaking full sentences ABD/GI: Normal bowel sounds; non-distended; soft, non-tender, no rebound, no guarding, no peritoneal signs, no hepatosplenomegaly BACK:  The back appears normal and is non-tender to palpation, there is no CVA tenderness EXT: Normal ROM in all joints; non-tender to palpation; no edema; normal capillary refill; no cyanosis, no calf tenderness or swelling    SKIN: Normal color for age and race; warm; no rash NEURO: Moves all extremities equally PSYCH: The patient's mood and manner are appropriate. Grooming and personal hygiene are appropriate.  MEDICAL DECISION MAKING: Patient here with chest pain.  She does have risk factors for ACS and PE.  Currently chest pain has improved but not completely resolved.  EKG shows no ischemic changes, arrhythmia or interval abnormality.  First troponin is negative.   Plan is to repeat second troponin and obtain d-dimer given she is at risk for PE given her history of hepatocellular carcinoma.  Will give morphine, Zofran for symptomatic relief.  She is also wheezing.  URI could be causing some of her symptoms.  Will give breathing treatment and reassess.  Chest x-ray today  was clear without pulmonary edema, pneumonia, pneumothorax, pleural effusion.  ED PROGRESS: Patient's d-dimer is elevated.  Will obtain CTA of the chest.  Second troponin negative.   CT scan showed no pulmonary embolus, pneumonia.  There are groundglass opacities noted bilaterally that could represent hypoventilatory change, alveolitis or pneumonitis.  I suspect more likely secondary to hypoventilation.  She has never had similar symptoms before.  Recommended follow-up with her PCP.  I do not feel she needs antibiotics at this time.  Lungs are clear after breathing treatment and she feels this helped her symptoms the most.  Suspect bronchitis causing bronchospasm.  No history of asthma or COPD.  Will discharge with albuterol inhaler.  No pericardial effusion noted on CT scan.  Troponin x2 negative.  Doubt ACS or dissection.  I feel she is safe to be discharged with outpatient follow-up.  At this time, I do not feel there is any life-threatening condition present. I have reviewed and discussed all results (EKG, imaging, lab, urine as appropriate) and exam findings with patient/family. I have reviewed nursing notes and appropriate previous records.  I feel the patient is safe to be discharged home without further emergent workup and can continue workup as an outpatient as needed. Discussed usual and customary return precautions. Patient/family verbalize understanding and are comfortable with this plan.  Outpatient follow-up has been provided as needed. All questions have been answered.    EKG Interpretation  Date/Time:  Monday July 30 2018 17:07:58 EST Ventricular Rate:  68 PR Interval:  178 QRS Duration: 96 QT Interval:  410 QTC Calculation: 435 R Axis:   29 Text Interpretation:  Normal sinus rhythm Normal ECG No significant change since last tracing Confirmed by Pryor Curia [phone]) on 07/30/2018 11:11:26  PM         Kyley Solow, Delice Bison, DO 07/31/18 [id number]

## 2018-07-30 NOTE — ED Notes (Signed)
Katie Dietzen(Sister) called for update.  Would like a call back [phone]-stated Pt will confirm to give information to her over phone.

## 2018-07-30 NOTE — ED Triage Notes (Signed)
Per [facility], Pt reports sudden pressure-like central CP that radiated to jaw, L breast and L shoulder. Pt reports shortness of breath, dizziness on standing, denies nausea/vomiting. Pt reports hx of HTN and liver cancer. Pt also reports productive cough x 1 day. Pt received 324 of ASA, 2 nitro, and 4 mg of morphine. VSS.

## 2018-07-31 ENCOUNTER — Emergency Department (HOSPITAL_COMMUNITY): Payer: Managed Care, Other (non HMO)

## 2018-07-31 LAB — D-DIMER, QUANTITATIVE: D-Dimer, Quant: 0.77 ug/mL-FEU — ABNORMAL HIGH (ref 0.00–0.50)

## 2018-07-31 LAB — HEPATIC FUNCTION PANEL
ALT: 65 U/L — ABNORMAL HIGH (ref 0–44)
AST: 51 U/L — ABNORMAL HIGH (ref 15–41)
Albumin: 3.8 g/dL (ref 3.5–5.0)
Alkaline Phosphatase: 90 U/L (ref 38–126)
BILIRUBIN TOTAL: 0.4 mg/dL (ref 0.3–1.2)
Bilirubin, Direct: 0.1 mg/dL (ref 0.0–0.2)
Indirect Bilirubin: 0.3 mg/dL (ref 0.3–0.9)
Total Protein: 6.9 g/dL (ref 6.5–8.1)

## 2018-07-31 LAB — I-STAT TROPONIN, ED: Troponin i, poc: 0 ng/mL (ref 0.00–0.08)

## 2018-07-31 MED ORDER — IOPAMIDOL (ISOVUE-370) INJECTION 76%
100.0000 mL | Freq: Once | INTRAVENOUS | Status: AC | PRN
  Administered 2018-07-31: 100 mL via INTRAVENOUS

## 2018-07-31 MED ORDER — ACETAMINOPHEN 500 MG PO TABS
1000.0000 mg | ORAL_TABLET | Freq: Once | ORAL | Status: AC
  Administered 2018-07-31: 1000 mg via ORAL
  Filled 2018-07-31: qty 2

## 2018-07-31 MED ORDER — IOPAMIDOL (ISOVUE-370) INJECTION 76%: 100.0000 mL | Freq: Once | INTRAVENOUS | Status: DC | PRN

## 2018-07-31 MED ORDER — ALBUTEROL SULFATE HFA 108 (90 BASE) MCG/ACT IN AERS
2.0000 | INHALATION_SPRAY | Freq: Once | RESPIRATORY_TRACT | Status: AC
  Administered 2018-07-31: 2 via RESPIRATORY_TRACT
  Filled 2018-07-31: qty 6.7

## 2018-07-31 NOTE — ED Notes (Signed)
Patient transported to CT 

## 2018-07-31 NOTE — Discharge Instructions (Signed)
You may use your albuterol inhaler 2 to 4 puffs every 2-4 hours as needed for shortness of breath, wheezing, chest heaviness.

## 2018-08-03 ENCOUNTER — Ambulatory Visit (INDEPENDENT_AMBULATORY_CARE_PROVIDER_SITE_OTHER): Payer: Managed Care, Other (non HMO) | Admitting: Cardiology

## 2018-08-03 ENCOUNTER — Ambulatory Visit (INDEPENDENT_AMBULATORY_CARE_PROVIDER_SITE_OTHER): Payer: Managed Care, Other (non HMO) | Admitting: Family

## 2018-08-03 ENCOUNTER — Encounter: Payer: Self-pay | Admitting: Family

## 2018-08-03 ENCOUNTER — Encounter: Payer: Self-pay | Admitting: Cardiology

## 2018-08-03 VITALS — BP 128/72 | HR 70 | Ht 66.0 in | Wt 265.0 lb

## 2018-08-03 VITALS — BP 121/68 | HR 66 | Temp 98.9°F | Resp 18 | Ht 66.0 in | Wt 265.0 lb

## 2018-08-03 DIAGNOSIS — Z23 Encounter for immunization: Secondary | ICD-10-CM | POA: Diagnosis not present

## 2018-08-03 DIAGNOSIS — Q231 Congenital insufficiency of aortic valve: Secondary | ICD-10-CM | POA: Diagnosis not present

## 2018-08-03 DIAGNOSIS — G4730 Sleep apnea, unspecified: Secondary | ICD-10-CM

## 2018-08-03 DIAGNOSIS — C220 Liver cell carcinoma: Secondary | ICD-10-CM

## 2018-08-03 DIAGNOSIS — J45901 Unspecified asthma with (acute) exacerbation: Secondary | ICD-10-CM | POA: Diagnosis not present

## 2018-08-03 MED ORDER — PREDNISONE 10 MG PO TABS: ORAL_TABLET | ORAL | 0 refills | Status: DC

## 2018-08-03 MED ORDER — ALBUTEROL SULFATE (2.5 MG/3ML) 0.083% IN NEBU: 2.5000 mg | INHALATION_SOLUTION | Freq: Four times a day (QID) | RESPIRATORY_TRACT | 1 refills | Status: DC | PRN

## 2018-08-03 NOTE — Patient Instructions (Signed)
Medication Instructions:  Your physician recommends that you continue on your current medications as directed. Please refer to the Current Medication list given to you today.  If you need a refill on your cardiac medications before your next appointment, please call your pharmacy.   Lab work: None.  If you have labs (blood work) drawn today and your tests are completely normal, you will receive your results only by: Marland Kitchen MyChart Message (if you have MyChart) OR . A paper copy in the mail If you have any lab test that is abnormal or we need to change your treatment, we will call you to review the results.  Testing/Procedures: None.   Follow-Up: At [facility], you and your health needs are our priority.  As part of our continuing mission to provide you with exceptional heart care, we have created designated Provider Care Teams.  These Care Teams include your primary Cardiologist (physician) and Advanced Practice Providers (APPs -  Physician Assistants and Nurse Practitioners) who all work together to provide you with the care you need, when you need it. You will need a follow up appointment in 6 months.  Please call our office 2 months in advance to schedule this appointment.  You may see No primary care provider on file. or another member of our [employer] in [place]: Jenne Campus, MD . Shirlee More, MD  Any Other Special Instructions Will Be Listed Below (If Applicable).

## 2018-08-03 NOTE — Progress Notes (Signed)
Cardiology Office Note:    Date:  08/03/2018   ID:  Stephanie Miles, DOB 1981/01/05, MRN [id number]  PCP:  Debbrah Alar, NP  Cardiologist:  Jenean Lindau, MD   Referring MD: Debbrah Alar, NP    ASSESSMENT:    1. Aortic regurgitation due to bicuspid aortic valve   2. Sleep apnea, unspecified type   3. Hepatocellular carcinoma ([place])    PLAN:    In order of problems listed above:  1. Primary prevention stressed with the patient.  Importance of compliance with diet and medication stressed and she vocalized understanding.  She had an episode of bronchospasm which was treated in the emergency room a few days ago.  She is completely recovered from it.  I reviewed CT scan done at the hospital emergency room for that visit and was relieved to know that her aorta looks unremarkable.  This is in view of the bicuspid aortic valve. 2. Patient will be seen in follow-up appointment in 9 months or earlier if the patient has any concerns    Medication Adjustments/Labs and Tests Ordered: Current medicines are reviewed at length with the patient today.  Concerns regarding medicines are outlined above.  No orders of the defined types were placed in this encounter.  No orders of the defined types were placed in this encounter.    No chief complaint on file.    History of Present Illness:    Stephanie Miles is a 38 y.o. female.  Patient is a pleasant lady with hepatocellular carcinoma.  Here for follow-up for aortic regurgitation.  She denies any problems at this time and takes care of activities of daily living.  No chest pain orthopnea or PND.  She is on the transplant list for liver.  Past Medical History:  Diagnosis Date  . Allergy   . Anxiety   . Aortic regurgitation due to bicuspid aortic valve   . Arthritis   . Borderline diabetes 10/20/2016  . Chronic kidney disease    kidney stones  . Depression   . Diabetes type 2, controlled ([place]) 10/20/2016  . Family  history of breast cancer in female   . Family history of colon cancer   . Fatty liver 11/28/2016  . GERD (gastroesophageal reflux disease)   . Heart murmur   . Hypertension   . IBS (irritable bowel syndrome)   . Insomnia   . Liver cancer ([place]) 03/2017  . Migraine headache   . NASH (nonalcoholic steatohepatitis) 04/04/2017   Per biopsy 9/18  . OCD (obsessive compulsive disorder)   . Orbital cellulitis    at age 52 was hospitalized for 3 months (almost died)   . OSA (obstructive sleep apnea)   . S/P appy 05/2013  . Sleep apnea     Past Surgical History:  Procedure Laterality Date  . COLONOSCOPY    . DENTAL SURGERY     wisdom teeth  . ESOPHAGOGASTRODUODENOSCOPY    . LAPAROSCOPIC APPENDECTOMY N/A 06/06/2013   Procedure: APPENDECTOMY LAPAROSCOPIC;  Surgeon: Imogene Burn. Georgette Dover, MD;  Location: [facility] OR;  Service: General;  Laterality: N/A;  . LIVER BIOPSY      Current Medications: Current Meds  Medication Sig  . ACCU-CHEK FASTCLIX LANCETS MISC Check sugar twice daily  . acetaminophen (TYLENOL) 500 MG tablet Take 500-1,000 mg by mouth every 6 (six) hours as needed (for pain).   Marland Kitchen albuterol (VENTOLIN HFA) 108 (90 Base) MCG/ACT inhaler Inhale 2 puffs into the lungs every 6 (six) hours as needed  for wheezing or shortness of breath.  . ALPRAZolam (XANAX) 0.5 MG tablet 1 tablet by mouth twice daily as needed (Patient taking differently: Take 0.5 mg by mouth See admin instructions. Take 0.5 mg by mouth at bedtime and an additional 0.5 mg during the day, as needed for anxiety)  . Blood Glucose Monitoring Suppl (ACCU-CHEK GUIDE) w/Device KIT 1 each by Does not apply route daily.  . budesonide-formoterol (SYMBICORT) 160-4.5 MCG/ACT inhaler INHALE TWO PUFFS BY MOUTH TWICE A DAY (Patient taking differently: Inhale 2 puffs into the lungs 2 (two) times daily. )  . cetirizine (ZYRTEC) 10 MG tablet Take 10 mg by mouth daily.  . Cholecalciferol (VITAMIN D3) 50 MCG (2000 UT) TABS Take 2,000 Units by mouth  daily.  Marland Kitchen dicyclomine (BENTYL) 20 MG tablet TAKE ONE TABLET BY MOUTH FOUR TIMES A DAY BEFORE MEALS AND AT BEDTIME (Patient taking differently: Take 20 mg by mouth See admin instructions. Take 20 mg by mouth two to three times a day- before meals and at bedtime)  . DOXORUBICIN HCL IV Inject into the vein See admin instructions.   Marland Kitchen escitalopram (LEXAPRO) 20 MG tablet Take 1 tablet (20 mg total) by mouth daily.  . fluticasone (FLONASE) 50 MCG/ACT nasal spray SPRAY TWO SPRAYS IN THE BOTH NOSTRILS DAILY AS NEEDED FOR ALLERGIES (Patient taking differently: Place 2 sprays into both nostrils daily. )  . furosemide (LASIX) 40 MG tablet Take 1 tablet (40 mg total) by mouth every morning.  Marland Kitchen glucose blood (ACCU-CHEK GUIDE) test strip Use as instructed to check blood sugar twice day.  . hydrocortisone 2.5 % cream Apply topically 2 (two) times daily.  . hydrocortisone-pramoxine (PROCTOFOAM HC) rectal foam Place 1 applicator rectally 2 (two) times daily. (Patient taking differently: Place 1 applicator rectally 2 (two) times daily as needed for hemorrhoids or anal itching. )  . losartan (COZAAR) 100 MG tablet Take 1 tablet (100 mg total) by mouth daily.  . metFORMIN (GLUCOPHAGE-XR) 500 MG 24 hr tablet TAKE TWO TABLETS BY MOUTH DAILY WITH BREAKFAST (Patient taking differently: Take 1,000 mg by mouth daily with breakfast. )  . metoprolol succinate (TOPROL-XL) 100 MG 24 hr tablet TAKE ONE TABLET BY MOUTH DAILY WITH OR IMMEDIATELY FOLLOWING A MEAL (Patient taking differently: Take 100 mg by mouth daily. WITH OR IMMEDIATELY FOLLOWING A MEAL)  . montelukast (SINGULAIR) 10 MG tablet Take 1 tablet (10 mg total) by mouth at bedtime.  . Multiple Vitamins-Minerals (MULTIVITAMIN WITH MINERALS) tablet Take 1 tablet by mouth daily.  . nitroGLYCERIN (NITRODUR - DOSED IN MG/24 HR) 0.2 mg/hr patch Apply 1/4th patch to affected shoulder, change daily  . omeprazole (PRILOSEC) 20 MG capsule TAKE 2 CAPSULES BY MOUTH EVERY MORNING  BEFORE BREAKFAST AND 1 CAPSULE EVERY EVENING BEFORE SUPPER (Patient taking differently: Take 20-40 mg by mouth See admin instructions. Take 40 mg by mouth in the morning before breakfast and 20 mg in the evening before supper)  . ondansetron (ZOFRAN ODT) 4 MG disintegrating tablet Take 1 tablet (4 mg total) by mouth every 8 (eight) hours as needed for nausea or vomiting.  Marland Kitchen oxyCODONE (ROXICODONE) 5 MG immediate release tablet Take 1 tablet (5 mg total) by mouth every 4 (four) hours as needed for severe pain.  . potassium chloride (K-DUR,KLOR-CON) 10 MEQ tablet Take 1 tablet (10 mEq total) by mouth every other day. (Patient taking differently: Take 10 mEq by mouth See admin instructions. Take 10 mEq by mouth at bedtime on even-numbered nights)  . Prenatal Vit-Fe Fumarate-FA (  MULTIVITAMIN-PRENATAL) 27-0.8 MG TABS tablet Take 1 tablet by mouth daily.   . SUMAtriptan (IMITREX) 50 MG tablet Take 1 tablet (50 mg total) by mouth every 2 (two) hours as needed for migraine. May repeat in 2 hours if headache persists or recurs.     Allergies:   Dust mite extract; Mold extract [trichophyton]; Nsaids; Codeine; Amlodipine; and Tape   Social History   Socioeconomic History  . Marital status: Single    Spouse name: Not on file  . Number of children: 0  . Years of education: Not on file  . Highest education level: Not on file  Occupational History  . Occupation: Ship broker  . Occupation: Programmer, applications  Social Needs  . Financial resource strain: Not on file  . Food insecurity:    Worry: Not on file    Inability: Not on file  . Transportation needs:    Medical: Not on file    Non-medical: Not on file  Tobacco Use  . Smoking status: Former Smoker    Packs/day: 1.00    Years: 20.00    Pack years: 20.00    Types: Cigarettes    Last attempt to quit: 02/22/2013    Years since quitting: 5.4  . Smokeless tobacco: Never Used  Substance and Sexual Activity  . Alcohol use: Never    Alcohol/week: 0.0 standard  drinks    Frequency: Never  . Drug use: No  . Sexual activity: Not Currently  Lifestyle  . Physical activity:    Days per week: Not on file    Minutes per session: Not on file  . Stress: Not on file  Relationships  . Social connections:    Talks on phone: Not on file    Gets together: Not on file    Attends religious service: Not on file    Active member of club or organization: Not on file    Attends meetings of clubs or organizations: Not on file    Relationship status: Not on file  Other Topics Concern  . Not on file  Social History Narrative   Working at [employer] doing [employer] alone (has a Neurosurgeon)   Working on a degree at [employer], will plan to return fall 2018   Enjoys spending time with family.   Friends and family in [place]     Family History: The patient's family history includes AAA (abdominal aortic aneurysm) in her paternal grandmother; Asthma in her mother and sister; Breast cancer in an other family member; Breast cancer (age of onset: 66) in her father; Cancer in an other family member; Cervical cancer in her maternal aunt; Colon cancer (age of onset: 24) in her maternal grandmother; Colon cancer (age of onset: 25) in an other family member; Colon polyps in her maternal grandfather; Diabetes in her father; Heart disease in her father and paternal grandfather; Heart disease (age of onset: 10) in her paternal uncle; Hyperlipidemia in her brother and father; Hypertension in her brother, father, and mother; Liver cancer in her maternal grandmother; Melanoma (age of onset: 71) in her maternal grandfather; Stomach cancer (age of onset: 70) in an other family member; Stroke (age of onset: 45) in her paternal uncle; Vision loss in her brother. There is no history of Esophageal cancer, Pancreatic cancer, or Rectal cancer.  ROS:   Please see the history of present illness.    All other systems reviewed and are negative.  EKGs/Labs/Other Studies Reviewed:    The  following  studies were reviewed today: I discussed my findings with the patient at length.   Recent Labs: 07/30/2018: ALT 65; BUN 9; Creatinine, Ser 0.83; Hemoglobin 13.1; Platelets 198; Potassium 4.0; Sodium 138  Recent Lipid Panel    Component Value Date/Time   CHOL 197 11/21/2016 1709   TRIG 316.0 (H) 11/21/2016 1709   HDL 37.90 (L) 11/21/2016 1709   CHOLHDL 5 11/21/2016 1709   VLDL 63.2 (H) 11/21/2016 1709   LDLDIRECT 126.0 11/21/2016 1709    Physical Exam:    VS:  BP 128/72 (BP Location: Right Arm, Patient Position: Sitting, Cuff Size: Normal)   Pulse 70   Ht 5' 6"  (1.676 m)   Wt 265 lb (120.2 kg)   LMP 07/18/2018   SpO2 97%   BMI 42.77 kg/m     Wt Readings from Last 3 Encounters:  08/03/18 265 lb (120.2 kg)  07/30/18 266 lb (120.7 kg)  07/04/18 277 lb (125.6 kg)     GEN: Patient is in no acute distress HEENT: Normal NECK: No JVD; No carotid bruits LYMPHATICS: No lymphadenopathy CARDIAC: Hear sounds regular, 2/6 systolic murmur at the apex. RESPIRATORY:  Clear to auscultation without rales, wheezing or rhonchi  ABDOMEN: Soft, non-tender, non-distended MUSCULOSKELETAL:  No edema; No deformity  SKIN: Warm and dry NEUROLOGIC:  Alert and oriented x 3 PSYCHIATRIC:  Normal affect   Signed, Jenean Lindau, MD  08/03/2018 2:50 PM    [facility]

## 2018-08-03 NOTE — Progress Notes (Signed)
Subjective:    Patient ID: Stephanie Miles, female    DOB: 1981-04-01, 38 y.o.   MRN: [id number]  HPI  38 yr old female who presents today with c/o productive cough and shortness of breath.  She was seen in the ED on 07/30/17 with c/o chest pain. ED record is reviewed. She had neg troponin x 2. She had an elevated d dimer which prompted CTA chest which was negative. She was discharged home with an albuterol inhaler. Of note she also saw her cardiologist today for hx of aortic regurg due to bicuspid aortic valve.   Reports that she still has a really bad cough, "feels like I can't get enough breath into my lungs." Using albuterol every 2-4 hrs with only 45 minutes of relief.  Has been using otc cold and flu as well as tessalon. Not improved with these measures. She denies known fever.   Review of Systems See HPI  Past Medical History:  Diagnosis Date  . Allergy   . Anxiety   . Aortic regurgitation due to bicuspid aortic valve   . Arthritis   . Borderline diabetes 10/20/2016  . Chronic kidney disease    kidney stones  . Depression   . Diabetes type 2, controlled ([place]) 10/20/2016  . Family history of breast cancer in female   . Family history of colon cancer   . Fatty liver 11/28/2016  . GERD (gastroesophageal reflux disease)   . Heart murmur   . Hypertension   . IBS (irritable bowel syndrome)   . Insomnia   . Liver cancer ([place]) 03/2017  . Migraine headache   . NASH (nonalcoholic steatohepatitis) 04/04/2017   Per biopsy 9/18  . OCD (obsessive compulsive disorder)   . Orbital cellulitis    at age 87 was hospitalized for 3 months (almost died)   . OSA (obstructive sleep apnea)   . S/P appy 05/2013  . Sleep apnea      Social History   Socioeconomic History  . Marital status: Single    Spouse name: Not on file  . Number of children: 0  . Years of education: Not on file  . Highest education level: Not on file  Occupational History  . Occupation: Ship broker  . Occupation: Education administrator  Social Needs  . Financial resource strain: Not on file  . Food insecurity:    Worry: Not on file    Inability: Not on file  . Transportation needs:    Medical: Not on file    Non-medical: Not on file  Tobacco Use  . Smoking status: Former Smoker    Packs/day: 1.00    Years: 20.00    Pack years: 20.00    Types: Cigarettes    Last attempt to quit: 02/22/2013    Years since quitting: 5.4  . Smokeless tobacco: Never Used  Substance and Sexual Activity  . Alcohol use: Never    Alcohol/week: 0.0 standard drinks    Frequency: Never  . Drug use: No  . Sexual activity: Not Currently  Lifestyle  . Physical activity:    Days per week: Not on file    Minutes per session: Not on file  . Stress: Not on file  Relationships  . Social connections:    Talks on phone: Not on file    Gets together: Not on file    Attends religious service: Not on file    Active member of club or organization: Not on file    Attends meetings  of clubs or organizations: Not on file    Relationship status: Not on file  . Intimate partner violence:    Fear of current or ex partner: Not on file    Emotionally abused: Not on file    Physically abused: Not on file    Forced sexual activity: Not on file  Other Topics Concern  . Not on file  Social History Narrative   Working at [employer] doing [employer] alone (has a Neurosurgeon)   Working on a degree at [employer], will plan to return fall 2018   Enjoys spending time with family.   Friends and family in [place]    Past Surgical History:  Procedure Laterality Date  . COLONOSCOPY    . DENTAL SURGERY     wisdom teeth  . ESOPHAGOGASTRODUODENOSCOPY    . LAPAROSCOPIC APPENDECTOMY N/A 06/06/2013   Procedure: APPENDECTOMY LAPAROSCOPIC;  Surgeon: Imogene Burn. Georgette Dover, MD;  Location: [facility] OR;  Service: General;  Laterality: N/A;  . LIVER BIOPSY      Family History  Problem Relation Age of Onset  . Asthma Mother   . Hypertension Mother   . Breast  cancer Father 50  . Diabetes Father   . Hypertension Father   . Heart disease Father   . Hyperlipidemia Father   . Asthma Sister   . Hyperlipidemia Brother   . Hypertension Brother   . Vision loss Brother   . Colon cancer Maternal Grandmother 51  . Liver cancer Maternal Grandmother   . Cervical cancer Maternal Aunt   . Stroke Paternal Uncle 32  . Melanoma Maternal Grandfather 53  . Colon polyps Maternal Grandfather   . AAA (abdominal aortic aneurysm) Paternal Grandmother   . Heart disease Paternal Grandfather   . Heart disease Paternal Uncle 19  . Colon cancer Other 34       MGM's mother  . Stomach cancer Other 25       MGM's mother  . Breast cancer Other        MGM's maternal aunt  . Cancer Other        MGM's maternal uncle with GI cancer  . Esophageal cancer Neg Hx   . Pancreatic cancer Neg Hx   . Rectal cancer Neg Hx     Allergies  Allergen Reactions  . Dust Mite Extract Shortness Of Breath and Cough  . Mold Extract [Trichophyton] Shortness Of Breath and Cough  . Nsaids Other (See Comments)    Patient is to NOT have NSAIDS  . Codeine Nausea Only    Required use of an antiemetic   . Amlodipine Swelling and Other (See Comments)    Edema   . Tape Rash    Medical tape and Band-Aids aren't tolerated    Current Outpatient Medications on File Prior to Visit  Medication Sig Dispense Refill  . ACCU-CHEK FASTCLIX LANCETS MISC Check sugar twice daily 102 each 3  . acetaminophen (TYLENOL) 500 MG tablet Take 500-1,000 mg by mouth every 6 (six) hours as needed (for pain).     Marland Kitchen albuterol (VENTOLIN HFA) 108 (90 Base) MCG/ACT inhaler Inhale 2 puffs into the lungs every 6 (six) hours as needed for wheezing or shortness of breath.    . ALPRAZolam (XANAX) 0.5 MG tablet 1 tablet by mouth twice daily as needed (Patient taking differently: Take 0.5 mg by mouth See admin instructions. Take 0.5 mg by mouth at bedtime and an additional 0.5 mg during the day, as needed for  anxiety) 45  tablet 2  . Blood Glucose Monitoring Suppl (ACCU-CHEK GUIDE) w/Device KIT 1 each by Does not apply route daily. 1 kit 0  . budesonide-formoterol (SYMBICORT) 160-4.5 MCG/ACT inhaler INHALE TWO PUFFS BY MOUTH TWICE A DAY (Patient taking differently: Inhale 2 puffs into the lungs 2 (two) times daily. ) 1 Inhaler 2  . cetirizine (ZYRTEC) 10 MG tablet Take 10 mg by mouth daily.    . Cholecalciferol (VITAMIN D3) 50 MCG (2000 UT) TABS Take 2,000 Units by mouth daily.    Marland Kitchen dicyclomine (BENTYL) 20 MG tablet TAKE ONE TABLET BY MOUTH FOUR TIMES A DAY BEFORE MEALS AND AT BEDTIME (Patient taking differently: Take 20 mg by mouth See admin instructions. Take 20 mg by mouth two to three times a day- before meals and at bedtime) 120 tablet 4  . DOXORUBICIN HCL IV Inject into the vein See admin instructions.     Marland Kitchen escitalopram (LEXAPRO) 20 MG tablet Take 1 tablet (20 mg total) by mouth daily. 30 tablet 5  . fluticasone (FLONASE) 50 MCG/ACT nasal spray SPRAY TWO SPRAYS IN THE BOTH NOSTRILS DAILY AS NEEDED FOR ALLERGIES (Patient taking differently: Place 2 sprays into both nostrils daily. ) 16 g 5  . furosemide (LASIX) 40 MG tablet Take 1 tablet (40 mg total) by mouth every morning. 90 tablet 1  . glucose blood (ACCU-CHEK GUIDE) test strip Use as instructed to check blood sugar twice day. 100 each 1  . hydrocortisone 2.5 % cream Apply topically 2 (two) times daily. 30 g 0  . hydrocortisone-pramoxine (PROCTOFOAM HC) rectal foam Place 1 applicator rectally 2 (two) times daily. (Patient taking differently: Place 1 applicator rectally 2 (two) times daily as needed for hemorrhoids or anal itching. ) 10 g 0  . losartan (COZAAR) 100 MG tablet Take 1 tablet (100 mg total) by mouth daily. 30 tablet 5  . metFORMIN (GLUCOPHAGE-XR) 500 MG 24 hr tablet TAKE TWO TABLETS BY MOUTH DAILY WITH BREAKFAST (Patient taking differently: Take 1,000 mg by mouth daily with breakfast. ) 60 tablet 4  . metoprolol succinate (TOPROL-XL) 100 MG 24 hr  tablet TAKE ONE TABLET BY MOUTH DAILY WITH OR IMMEDIATELY FOLLOWING A MEAL (Patient taking differently: Take 100 mg by mouth daily. WITH OR IMMEDIATELY FOLLOWING A MEAL) 30 tablet 4  . montelukast (SINGULAIR) 10 MG tablet Take 1 tablet (10 mg total) by mouth at bedtime. 30 tablet 4  . Multiple Vitamins-Minerals (MULTIVITAMIN WITH MINERALS) tablet Take 1 tablet by mouth daily.    . nitroGLYCERIN (NITRODUR - DOSED IN MG/24 HR) 0.2 mg/hr patch Apply 1/4th patch to affected shoulder, change daily 30 patch 1  . omeprazole (PRILOSEC) 20 MG capsule TAKE 2 CAPSULES BY MOUTH EVERY MORNING BEFORE BREAKFAST AND 1 CAPSULE EVERY EVENING BEFORE SUPPER (Patient taking differently: Take 20-40 mg by mouth See admin instructions. Take 40 mg by mouth in the morning before breakfast and 20 mg in the evening before supper) 90 capsule 5  . ondansetron (ZOFRAN ODT) 4 MG disintegrating tablet Take 1 tablet (4 mg total) by mouth every 8 (eight) hours as needed for nausea or vomiting. 30 tablet 0  . oxyCODONE (ROXICODONE) 5 MG immediate release tablet Take 1 tablet (5 mg total) by mouth every 4 (four) hours as needed for severe pain. 30 tablet 0  . potassium chloride (K-DUR,KLOR-CON) 10 MEQ tablet Take 1 tablet (10 mEq total) by mouth every other day. (Patient taking differently: Take 10 mEq by mouth See admin instructions. Take 10 mEq  by mouth at bedtime on even-numbered nights) 15 tablet 5  . Prenatal Vit-Fe Fumarate-FA (MULTIVITAMIN-PRENATAL) 27-0.8 MG TABS tablet Take 1 tablet by mouth daily.     . SUMAtriptan (IMITREX) 50 MG tablet Take 1 tablet (50 mg total) by mouth every 2 (two) hours as needed for migraine. May repeat in 2 hours if headache persists or recurs. 10 tablet 0   No current facility-administered medications on file prior to visit.     BP 121/68 (BP Location: Right Arm, Patient Position: Sitting, Cuff Size: Large)   Pulse 66   Temp 98.9 F (37.2 C) (Oral)   Resp 18   Ht 5' 6"  (1.676 m)   Wt 265 lb  (120.2 kg)   LMP 07/18/2018   SpO2 98%   BMI 42.77 kg/m       Objective:   Physical Exam Constitutional:      Appearance: She is well-developed.  Neck:     Musculoskeletal: Neck supple.     Thyroid: No thyromegaly.  Cardiovascular:     Rate and Rhythm: Normal rate and regular rhythm.     Heart sounds: Normal heart sounds. No murmur.  Pulmonary:     Effort: Pulmonary effort is normal. No respiratory distress.     Breath sounds: Normal breath sounds. No wheezing.  Skin:    General: Skin is warm and dry.  Neurological:     Mental Status: She is alert and oriented to person, place, and time.  Psychiatric:        Behavior: Behavior normal.        Thought Content: Thought content normal.        Judgment: Judgment normal.           Assessment & Plan:   Acute asthma exacerbation- no over wheezing today but symptomatic. Will rx with pred taper. She requests a nebulizer.Gave hand written rx for nebulizer and tubing + rx to pharmacy for albuterol nebs. She is advised to call if symptoms worsen or if they fail to improve.

## 2018-08-03 NOTE — Patient Instructions (Signed)
Please begin prednisone taper. Continue albuterol every 4-6 hrs as needed. Call if symptoms worsen or if not improved in 3-4 day.

## 2018-08-06 ENCOUNTER — Ambulatory Visit: Payer: Managed Care, Other (non HMO) | Admitting: Family

## 2018-08-08 ENCOUNTER — Ambulatory Visit: Payer: Managed Care, Other (non HMO) | Admitting: Family

## 2018-08-14 ENCOUNTER — Encounter: Payer: Self-pay | Admitting: Family

## 2018-09-21 ENCOUNTER — Ambulatory Visit (INDEPENDENT_AMBULATORY_CARE_PROVIDER_SITE_OTHER): Payer: Managed Care, Other (non HMO) | Admitting: Family

## 2018-09-21 ENCOUNTER — Encounter: Payer: Self-pay | Admitting: Family

## 2018-09-21 DIAGNOSIS — F329 Major depressive disorder, single episode, unspecified: Secondary | ICD-10-CM | POA: Diagnosis not present

## 2018-09-21 DIAGNOSIS — N921 Excessive and frequent menstruation with irregular cycle: Secondary | ICD-10-CM

## 2018-09-21 DIAGNOSIS — N920 Excessive and frequent menstruation with regular cycle: Secondary | ICD-10-CM

## 2018-09-21 DIAGNOSIS — F32A Depression, unspecified: Secondary | ICD-10-CM

## 2018-09-21 MED ORDER — BUPROPION HCL ER (XL) 150 MG PO TB24: 150.0000 mg | ORAL_TABLET | Freq: Every day | ORAL | 1 refills | Status: DC

## 2018-09-21 NOTE — Progress Notes (Signed)
Subjective:    Patient ID: Stephanie Miles, female    DOB: 14-Oct-1980, 38 y.o.   MRN: [id number]  HPI  Patient is a 38 yr old female who presents today with two concerns:  1) Depression concerns- reports symptoms of depression during menstrual cycle. Repots that she just wants to lay in bed. Notes that symptoms were so bad this month that she missed work.  Reports that she recently learned that she is not officially on the liver transplant list at [facility] because they do not have a liver biopsy or the lesions to prove cancer. This is frustrating to her. She is planning to look into some counseling video visits through her health insurance. She denies SI/HI but notes some anorexia.  Wt Readings from Last 3 Encounters:  09/21/18 259 lb (117.5 kg)  08/03/18 265 lb (120.2 kg)  08/03/18 265 lb (120.2 kg)   She reports that she has very heavy periods with cramping which is also a problem for her.  Review of Systems Past Medical History:  Diagnosis Date  . Allergy   . Anxiety   . Aortic regurgitation due to bicuspid aortic valve   . Arthritis   . Borderline diabetes 10/20/2016  . Chronic kidney disease    kidney stones  . Depression   . Diabetes type 2, controlled ([place]) 10/20/2016  . Family history of breast cancer in female   . Family history of colon cancer   . Fatty liver 11/28/2016  . GERD (gastroesophageal reflux disease)   . Heart murmur   . Hypertension   . IBS (irritable bowel syndrome)   . Insomnia   . Liver cancer ([place]) 03/2017  . Migraine headache   . NASH (nonalcoholic steatohepatitis) 04/04/2017   Per biopsy 9/18  . OCD (obsessive compulsive disorder)   . Orbital cellulitis    at age 72 was hospitalized for 3 months (almost died)   . OSA (obstructive sleep apnea)   . S/P appy 05/2013  . Sleep apnea      Social History   Socioeconomic History  . Marital status: Single    Spouse name: Not on file  . Number of children: 0  . Years of education: Not on file  .  Highest education level: Not on file  Occupational History  . Occupation: Ship broker  . Occupation: Programmer, applications  Social Needs  . Financial resource strain: Not on file  . Food insecurity:    Worry: Not on file    Inability: Not on file  . Transportation needs:    Medical: Not on file    Non-medical: Not on file  Tobacco Use  . Smoking status: Former Smoker    Packs/day: 1.00    Years: 20.00    Pack years: 20.00    Types: Cigarettes    Last attempt to quit: 02/22/2013    Years since quitting: 5.5  . Smokeless tobacco: Never Used  Substance and Sexual Activity  . Alcohol use: Never    Alcohol/week: 0.0 standard drinks    Frequency: Never  . Drug use: No  . Sexual activity: Not Currently  Lifestyle  . Physical activity:    Days per week: Not on file    Minutes per session: Not on file  . Stress: Not on file  Relationships  . Social connections:    Talks on phone: Not on file    Gets together: Not on file    Attends religious service: Not on file    Active  member of club or organization: Not on file    Attends meetings of clubs or organizations: Not on file    Relationship status: Not on file  . Intimate partner violence:    Fear of current or ex partner: Not on file    Emotionally abused: Not on file    Physically abused: Not on file    Forced sexual activity: Not on file  Other Topics Concern  . Not on file  Social History Narrative   Working at [employer] doing [employer] alone (has a Neurosurgeon)   Working on a degree at [employer], will plan to return fall 2018   Enjoys spending time with family.   Friends and family in [place]    Past Surgical History:  Procedure Laterality Date  . COLONOSCOPY    . DENTAL SURGERY     wisdom teeth  . ESOPHAGOGASTRODUODENOSCOPY    . LAPAROSCOPIC APPENDECTOMY N/A 06/06/2013   Procedure: APPENDECTOMY LAPAROSCOPIC;  Surgeon: Imogene Burn. Georgette Dover, MD;  Location: [place] [facility];  Service: General;  Laterality: N/A;  . LIVER BIOPSY       Family History  Problem Relation Age of Onset  . Asthma Mother   . Hypertension Mother   . Breast cancer Father 15  . Diabetes Father   . Hypertension Father   . Heart disease Father   . Hyperlipidemia Father   . Asthma Sister   . Hyperlipidemia Brother   . Hypertension Brother   . Vision loss Brother   . Colon cancer Maternal Grandmother 42  . Liver cancer Maternal Grandmother   . Cervical cancer Maternal Aunt   . Stroke Paternal Uncle 53  . Melanoma Maternal Grandfather 41  . Colon polyps Maternal Grandfather   . AAA (abdominal aortic aneurysm) Paternal Grandmother   . Heart disease Paternal Grandfather   . Heart disease Paternal Uncle 73  . Colon cancer Other 51       MGM's mother  . Stomach cancer Other 4       MGM's mother  . Breast cancer Other        MGM's maternal aunt  . Cancer Other        MGM's maternal uncle with GI cancer  . Esophageal cancer Neg Hx   . Pancreatic cancer Neg Hx   . Rectal cancer Neg Hx     Allergies  Allergen Reactions  . Dust Mite Extract Shortness Of Breath and Cough  . Mold Extract [Trichophyton] Shortness Of Breath and Cough  . Nsaids Other (See Comments)    Patient is to NOT have NSAIDS  . Codeine Nausea Only    Required use of an antiemetic   . Amlodipine Swelling and Other (See Comments)    Edema   . Tape Rash    Medical tape and Band-Aids aren't tolerated    Current Outpatient Medications on File Prior to Visit  Medication Sig Dispense Refill  . ACCU-CHEK FASTCLIX LANCETS MISC Check sugar twice daily 102 each 3  . acetaminophen (TYLENOL) 500 MG tablet Take 500-1,000 mg by mouth every 6 (six) hours as needed (for pain).     Marland Kitchen albuterol (PROVENTIL) (2.5 MG/3ML) 0.083% nebulizer solution Take 3 mLs (2.5 mg total) by nebulization every 6 (six) hours as needed for wheezing or shortness of breath. 150 mL 1  . albuterol (VENTOLIN HFA) 108 (90 Base) MCG/ACT inhaler Inhale 2 puffs into the lungs every 6 (six) hours as needed  for wheezing or shortness of breath.    Marland Kitchen  ALPRAZolam (XANAX) 0.5 MG tablet 1 tablet by mouth twice daily as needed (Patient taking differently: Take 0.5 mg by mouth See admin instructions. Take 0.5 mg by mouth at bedtime and an additional 0.5 mg during the day, as needed for anxiety) 45 tablet 2  . Blood Glucose Monitoring Suppl (ACCU-CHEK GUIDE) w/Device KIT 1 each by Does not apply route daily. 1 kit 0  . budesonide-formoterol (SYMBICORT) 160-4.5 MCG/ACT inhaler INHALE TWO PUFFS BY MOUTH TWICE A DAY (Patient taking differently: Inhale 2 puffs into the lungs 2 (two) times daily. ) 1 Inhaler 2  . cetirizine (ZYRTEC) 10 MG tablet Take 10 mg by mouth daily.    . Cholecalciferol (VITAMIN D3) 50 MCG (2000 UT) TABS Take 2,000 Units by mouth daily.    Marland Kitchen dicyclomine (BENTYL) 20 MG tablet TAKE ONE TABLET BY MOUTH FOUR TIMES A DAY BEFORE MEALS AND AT BEDTIME (Patient taking differently: Take 20 mg by mouth See admin instructions. Take 20 mg by mouth two to three times a day- before meals and at bedtime) 120 tablet 4  . DOXORUBICIN HCL IV Inject into the vein See admin instructions.     Marland Kitchen escitalopram (LEXAPRO) 20 MG tablet Take 1 tablet (20 mg total) by mouth daily. 30 tablet 5  . fluticasone (FLONASE) 50 MCG/ACT nasal spray SPRAY TWO SPRAYS IN THE BOTH NOSTRILS DAILY AS NEEDED FOR ALLERGIES (Patient taking differently: Place 2 sprays into both nostrils daily. ) 16 g 5  . furosemide (LASIX) 40 MG tablet Take 1 tablet (40 mg total) by mouth every morning. 90 tablet 1  . glucose blood (ACCU-CHEK GUIDE) test strip Use as instructed to check blood sugar twice day. 100 each 1  . hydrocortisone 2.5 % cream Apply topically 2 (two) times daily. 30 g 0  . hydrocortisone-pramoxine (PROCTOFOAM HC) rectal foam Place 1 applicator rectally 2 (two) times daily. (Patient taking differently: Place 1 applicator rectally 2 (two) times daily as needed for hemorrhoids or anal itching. ) 10 g 0  . losartan (COZAAR) 100 MG tablet  Take 1 tablet (100 mg total) by mouth daily. 30 tablet 5  . metFORMIN (GLUCOPHAGE-XR) 500 MG 24 hr tablet TAKE TWO TABLETS BY MOUTH DAILY WITH BREAKFAST (Patient taking differently: Take 1,000 mg by mouth daily with breakfast. ) 60 tablet 4  . metoprolol succinate (TOPROL-XL) 100 MG 24 hr tablet TAKE ONE TABLET BY MOUTH DAILY WITH OR IMMEDIATELY FOLLOWING A MEAL (Patient taking differently: Take 100 mg by mouth daily. WITH OR IMMEDIATELY FOLLOWING A MEAL) 30 tablet 4  . montelukast (SINGULAIR) 10 MG tablet Take 1 tablet (10 mg total) by mouth at bedtime. 30 tablet 4  . Multiple Vitamins-Minerals (MULTIVITAMIN WITH MINERALS) tablet Take 1 tablet by mouth daily.    . nitroGLYCERIN (NITRODUR - DOSED IN MG/24 HR) 0.2 mg/hr patch Apply 1/4th patch to affected shoulder, change daily 30 patch 1  . omeprazole (PRILOSEC) 20 MG capsule TAKE 2 CAPSULES BY MOUTH EVERY MORNING BEFORE BREAKFAST AND 1 CAPSULE EVERY EVENING BEFORE SUPPER (Patient taking differently: Take 20-40 mg by mouth See admin instructions. Take 40 mg by mouth in the morning before breakfast and 20 mg in the evening before supper) 90 capsule 5  . ondansetron (ZOFRAN ODT) 4 MG disintegrating tablet Take 1 tablet (4 mg total) by mouth every 8 (eight) hours as needed for nausea or vomiting. 30 tablet 0  . oxyCODONE (ROXICODONE) 5 MG immediate release tablet Take 1 tablet (5 mg total) by mouth every 4 (four) hours  as needed for severe pain. 30 tablet 0  . potassium chloride (K-DUR,KLOR-CON) 10 MEQ tablet Take 1 tablet (10 mEq total) by mouth every other day. (Patient taking differently: Take 10 mEq by mouth See admin instructions. Take 10 mEq by mouth at bedtime on even-numbered nights) 15 tablet 5  . predniSONE (DELTASONE) 10 MG tablet 4 tabs by mouth once daily for 2 days, then 3 tabs daily x 2 days, then 2 tabs daily x 2 days, then 1 tab daily x 2 days 20 tablet 0  . Prenatal Vit-Fe Fumarate-FA (MULTIVITAMIN-PRENATAL) 27-0.8 MG TABS tablet Take 1  tablet by mouth daily.     . SUMAtriptan (IMITREX) 50 MG tablet Take 1 tablet (50 mg total) by mouth every 2 (two) hours as needed for migraine. May repeat in 2 hours if headache persists or recurs. 10 tablet 0   No current facility-administered medications on file prior to visit.     BP 109/68 (BP Location: Right Arm, Patient Position: Sitting, Cuff Size: Large)   Pulse 83   Temp 98.4 F (36.9 C) (Oral)   Resp 18   Ht 5' 6"  (1.676 m)   Wt 259 lb (117.5 kg)   SpO2 97%   BMI 41.80 kg/m       Objective:   Physical Exam Constitutional:      Appearance: She is well-developed.  Skin:    General: Skin is warm and dry.  Neurological:     Mental Status: She is alert and oriented to person, place, and time.  Psychiatric:        Behavior: Behavior normal.        Thought Content: Thought content normal.        Cognition and Memory: Cognition and memory normal.        Judgment: Judgment normal.     Comments: Flattened affect           Assessment & Plan:  Depression-deteriorated. will add wellbutrin to her lexapro.  I encouraged her to work on getting established with a Social worker.  I really think that all of her health issues are wearing her down mentally.    Menorrhagia/metrorrhagia- new.  Not a candidate for OCP's due to her liver history. I suggested that she ask her hepatologist if she could be a candidate for a mirena IUD and if so I can refer her to gyn for this.

## 2018-09-21 NOTE — Patient Instructions (Addendum)
Please begin wellbutrin.

## 2018-09-25 ENCOUNTER — Encounter: Payer: Self-pay | Admitting: Family

## 2018-10-06 ENCOUNTER — Other Ambulatory Visit: Payer: Self-pay | Admitting: Family

## 2018-10-06 DIAGNOSIS — R0600 Dyspnea, unspecified: Secondary | ICD-10-CM

## 2018-10-10 DIAGNOSIS — H6983 Other specified disorders of Eustachian tube, bilateral: Secondary | ICD-10-CM | POA: Insufficient documentation

## 2018-10-10 DIAGNOSIS — H6993 Unspecified Eustachian tube disorder, bilateral: Secondary | ICD-10-CM

## 2018-10-10 HISTORY — DX: Unspecified eustachian tube disorder, bilateral: H69.93

## 2018-10-10 HISTORY — DX: Other specified disorders of eustachian tube, bilateral: H69.83

## 2018-10-11 ENCOUNTER — Ambulatory Visit (INDEPENDENT_AMBULATORY_CARE_PROVIDER_SITE_OTHER): Payer: Managed Care, Other (non HMO) | Admitting: Family Medicine

## 2018-10-11 ENCOUNTER — Other Ambulatory Visit: Payer: Self-pay

## 2018-10-11 ENCOUNTER — Encounter: Payer: Self-pay | Admitting: Family Medicine

## 2018-10-11 VITALS — BP 120/70 | HR 77 | Ht 66.0 in | Wt 257.0 lb

## 2018-10-11 DIAGNOSIS — H906 Mixed conductive and sensorineural hearing loss, bilateral: Secondary | ICD-10-CM

## 2018-10-11 DIAGNOSIS — Z3202 Encounter for pregnancy test, result negative: Secondary | ICD-10-CM

## 2018-10-11 DIAGNOSIS — Z124 Encounter for screening for malignant neoplasm of cervix: Secondary | ICD-10-CM | POA: Diagnosis not present

## 2018-10-11 DIAGNOSIS — Z1151 Encounter for screening for human papillomavirus (HPV): Secondary | ICD-10-CM

## 2018-10-11 DIAGNOSIS — Z01419 Encounter for gynecological examination (general) (routine) without abnormal findings: Secondary | ICD-10-CM

## 2018-10-11 DIAGNOSIS — H9313 Tinnitus, bilateral: Secondary | ICD-10-CM | POA: Insufficient documentation

## 2018-10-11 DIAGNOSIS — Z01812 Encounter for preprocedural laboratory examination: Secondary | ICD-10-CM

## 2018-10-11 DIAGNOSIS — N946 Dysmenorrhea, unspecified: Secondary | ICD-10-CM

## 2018-10-11 HISTORY — DX: Tinnitus, bilateral: H93.13

## 2018-10-11 HISTORY — DX: Mixed conductive and sensorineural hearing loss, bilateral: H90.6

## 2018-10-11 LAB — POCT URINE PREGNANCY: Preg Test, Ur: NEGATIVE

## 2018-10-11 MED ORDER — MISOPROSTOL 200 MCG PO TABS: ORAL_TABLET | ORAL | 1 refills | Status: DC

## 2018-10-11 NOTE — Progress Notes (Signed)
Patient states she has liver cancer and is unable to do any horomonal birth control. Patient having depression and cramping  And heavy flow with periods. Patient has cancer doctors at [facility] and [place]. Kathrene Alu RN

## 2018-10-11 NOTE — Progress Notes (Signed)
GYNECOLOGY ANNUAL PREVENTATIVE CARE ENCOUNTER NOTE  Subjective:   Stephanie Miles is a 38 y.o. No obstetric history on file. female here for a routine annual gynecologic exam.  Current complaints: heavy menses and extremely painful menses.   Denies abnormal vaginal bleeding, discharge, pelvic pain, problems with intercourse or other gynecologic concerns.    Patient has liver cancer and is currently undergoing radiation therapy. Hepatologist at [facility] told patient that progestin IUD okay.  Gynecologic History Patient's last menstrual period was 10/04/2018. Patient is not sexually active  Contraception: none Last Pap: 2018. Results were: normal Last mammogram: 12/2017. Results were: normal  Obstetric History OB History  No obstetric history on file.    Past Medical History:  Diagnosis Date  . Allergy   . Anxiety   . Aortic regurgitation due to bicuspid aortic valve   . Arthritis   . Borderline diabetes 10/20/2016  . Chronic kidney disease    kidney stones  . Depression   . Diabetes type 2, controlled ([place]) 10/20/2016  . Family history of breast cancer in female   . Family history of colon cancer   . Fatty liver 11/28/2016  . GERD (gastroesophageal reflux disease)   . Heart murmur   . Hypertension   . IBS (irritable bowel syndrome)   . Insomnia   . Liver cancer ([place]) 03/2017  . Migraine headache   . NASH (nonalcoholic steatohepatitis) 04/04/2017   Per biopsy 9/18  . OCD (obsessive compulsive disorder)   . Orbital cellulitis    at age 6 was hospitalized for 3 months (almost died)   . OSA (obstructive sleep apnea)   . S/P appy 05/2013  . Sleep apnea     Past Surgical History:  Procedure Laterality Date  . COLONOSCOPY    . DENTAL SURGERY     wisdom teeth  . ESOPHAGOGASTRODUODENOSCOPY    . LAPAROSCOPIC APPENDECTOMY N/A 06/06/2013   Procedure: APPENDECTOMY LAPAROSCOPIC;  Surgeon: Imogene Burn. Georgette Dover, MD;  Location: [facility];  Service: General;  Laterality: N/A;  . LIVER  BIOPSY      Current Outpatient Medications on File Prior to Visit  Medication Sig Dispense Refill  . ACCU-CHEK FASTCLIX LANCETS MISC Check sugar twice daily 102 each 3  . acetaminophen (TYLENOL) 500 MG tablet Take 500-1,000 mg by mouth every 6 (six) hours as needed (for pain).     Marland Kitchen albuterol (PROVENTIL) (2.5 MG/3ML) 0.083% nebulizer solution Take 3 mLs (2.5 mg total) by nebulization every 6 (six) hours as needed for wheezing or shortness of breath. 150 mL 1  . albuterol (VENTOLIN HFA) 108 (90 Base) MCG/ACT inhaler Inhale 2 puffs into the lungs every 6 (six) hours as needed for wheezing or shortness of breath.    . ALPRAZolam (XANAX) 0.5 MG tablet TAKE ONE TABLET BY MOUTH TWICE A DAY AS NEEDED 45 tablet 1  . Blood Glucose Monitoring Suppl (ACCU-CHEK GUIDE) w/Device KIT 1 each by Does not apply route daily. 1 kit 0  . budesonide-formoterol (SYMBICORT) 160-4.5 MCG/ACT inhaler INHALE TWO PUFFS BY MOUTH TWICE A DAY 10.2 g 1  . buPROPion (WELLBUTRIN XL) 150 MG 24 hr tablet Take 1 tablet (150 mg total) by mouth daily. 30 tablet 1  . cetirizine (ZYRTEC) 10 MG tablet Take 10 mg by mouth daily.    . Cholecalciferol (VITAMIN D3) 50 MCG (2000 UT) TABS Take 2,000 Units by mouth daily.    Marland Kitchen dicyclomine (BENTYL) 20 MG tablet TAKE ONE TABLET BY MOUTH FOUR TIMES A DAY BEFORE  MEALS AND AT BEDTIME (Patient taking differently: Take 20 mg by mouth See admin instructions. Take 20 mg by mouth two to three times a day- before meals and at bedtime) 120 tablet 4  . DOXORUBICIN HCL IV Inject into the vein See admin instructions.     Marland Kitchen escitalopram (LEXAPRO) 20 MG tablet Take 1 tablet (20 mg total) by mouth daily. 30 tablet 5  . fluticasone (FLONASE) 50 MCG/ACT nasal spray SPRAY TWO SPRAYS IN THE BOTH NOSTRILS DAILY AS NEEDED FOR ALLERGIES (Patient taking differently: Place 2 sprays into both nostrils daily. ) 16 g 5  . furosemide (LASIX) 40 MG tablet Take 1 tablet (40 mg total) by mouth every morning. 90 tablet 1  .  glucose blood (ACCU-CHEK GUIDE) test strip Use as instructed to check blood sugar twice day. 100 each 1  . hydrocortisone 2.5 % cream Apply topically 2 (two) times daily. 30 g 0  . hydrocortisone-pramoxine (PROCTOFOAM HC) rectal foam Place 1 applicator rectally 2 (two) times daily. (Patient taking differently: Place 1 applicator rectally 2 (two) times daily as needed for hemorrhoids or anal itching. ) 10 g 0  . losartan (COZAAR) 100 MG tablet Take 1 tablet (100 mg total) by mouth daily. 30 tablet 5  . metFORMIN (GLUCOPHAGE-XR) 500 MG 24 hr tablet TAKE TWO TABLETS BY MOUTH DAILY WITH BREAKFAST (Patient taking differently: Take 1,000 mg by mouth daily with breakfast. ) 60 tablet 4  . metoprolol succinate (TOPROL-XL) 100 MG 24 hr tablet TAKE ONE TABLET BY MOUTH DAILY WITH OR IMMEDIATELY FOLLOWING A MEAL (Patient taking differently: Take 100 mg by mouth daily. WITH OR IMMEDIATELY FOLLOWING A MEAL) 30 tablet 4  . montelukast (SINGULAIR) 10 MG tablet Take 1 tablet (10 mg total) by mouth at bedtime. 30 tablet 4  . Multiple Vitamins-Minerals (MULTIVITAMIN WITH MINERALS) tablet Take 1 tablet by mouth daily.    . nitroGLYCERIN (NITRODUR - DOSED IN MG/24 HR) 0.2 mg/hr patch Apply 1/4th patch to affected shoulder, change daily 30 patch 1  . omeprazole (PRILOSEC) 20 MG capsule TAKE TWO CAPSULES BY MOUTH EVERY MORNING BEFORE BREAKFAST AND 1 CAPSULE EVERY EVENING BEFORE SUPPER 90 capsule 4  . ondansetron (ZOFRAN ODT) 4 MG disintegrating tablet Take 1 tablet (4 mg total) by mouth every 8 (eight) hours as needed for nausea or vomiting. 30 tablet 0  . oxyCODONE (ROXICODONE) 5 MG immediate release tablet Take 1 tablet (5 mg total) by mouth every 4 (four) hours as needed for severe pain. 30 tablet 0  . potassium chloride (K-DUR,KLOR-CON) 10 MEQ tablet Take 1 tablet (10 mEq total) by mouth every other day. (Patient taking differently: Take 10 mEq by mouth See admin instructions. Take 10 mEq by mouth at bedtime on  even-numbered nights) 15 tablet 5  . predniSONE (DELTASONE) 10 MG tablet 4 tabs by mouth once daily for 2 days, then 3 tabs daily x 2 days, then 2 tabs daily x 2 days, then 1 tab daily x 2 days 20 tablet 0  . Prenatal Vit-Fe Fumarate-FA (MULTIVITAMIN-PRENATAL) 27-0.8 MG TABS tablet Take 1 tablet by mouth daily.     . SUMAtriptan (IMITREX) 50 MG tablet Take 1 tablet (50 mg total) by mouth every 2 (two) hours as needed for migraine. May repeat in 2 hours if headache persists or recurs. 10 tablet 0   No current facility-administered medications on file prior to visit.     Allergies  Allergen Reactions  . Dust Mite Extract Shortness Of Breath and Cough  .  Mold Extract [Trichophyton] Shortness Of Breath and Cough  . Nsaids Other (See Comments)    Patient is to NOT have NSAIDS  . Codeine Nausea Only    Required use of an antiemetic   . Amlodipine Swelling and Other (See Comments)    Edema   . Tape Rash    Medical tape and Band-Aids aren't tolerated    Social History   Socioeconomic History  . Marital status: Single    Spouse name: Not on file  . Number of children: 0  . Years of education: Not on file  . Highest education level: Not on file  Occupational History  . Occupation: Ship broker  . Occupation: Programmer, applications  Social Needs  . Financial resource strain: Not on file  . Food insecurity:    Worry: Not on file    Inability: Not on file  . Transportation needs:    Medical: Not on file    Non-medical: Not on file  Tobacco Use  . Smoking status: Former Smoker    Packs/day: 1.00    Years: 20.00    Pack years: 20.00    Types: Cigarettes    Last attempt to quit: 02/22/2013    Years since quitting: 5.6  . Smokeless tobacco: Never Used  Substance and Sexual Activity  . Alcohol use: Never    Alcohol/week: 0.0 standard drinks    Frequency: Never  . Drug use: No  . Sexual activity: Not Currently  Lifestyle  . Physical activity:    Days per week: Not on file    Minutes per  session: Not on file  . Stress: Not on file  Relationships  . Social connections:    Talks on phone: Not on file    Gets together: Not on file    Attends religious service: Not on file    Active member of club or organization: Not on file    Attends meetings of clubs or organizations: Not on file    Relationship status: Not on file  . Intimate partner violence:    Fear of current or ex partner: Not on file    Emotionally abused: Not on file    Physically abused: Not on file    Forced sexual activity: Not on file  Other Topics Concern  . Not on file  Social History Narrative   Working at [employer] doing [employer] alone (has a Neurosurgeon)   Working on a degree at [employer], will plan to return fall 2018   Enjoys spending time with family.   Friends and family in [place]    Family History  Problem Relation Age of Onset  . Asthma Mother   . Hypertension Mother   . Breast cancer Father 13  . Diabetes Father   . Hypertension Father   . Heart disease Father   . Hyperlipidemia Father   . Asthma Sister   . Hyperlipidemia Brother   . Hypertension Brother   . Vision loss Brother   . Colon cancer Maternal Grandmother 7  . Liver cancer Maternal Grandmother   . Cervical cancer Maternal Aunt   . Stroke Paternal Uncle 74  . Melanoma Maternal Grandfather 90  . Colon polyps Maternal Grandfather   . AAA (abdominal aortic aneurysm) Paternal Grandmother   . Heart disease Paternal Grandfather   . Heart disease Paternal Uncle 41  . Colon cancer Other 75       MGM's mother  . Stomach cancer Other 37  MGM's mother  . Breast cancer Other        MGM's maternal aunt  . Cancer Other        MGM's maternal uncle with GI cancer  . Esophageal cancer Neg Hx   . Pancreatic cancer Neg Hx   . Rectal cancer Neg Hx     The following portions of the patient's history were reviewed and updated as appropriate: allergies, current medications, past family history, past medical history,  past social history, past surgical history and problem list.  Review of Systems Pertinent items are noted in HPI.   Objective:  BP 120/70   Pulse 77   Ht 5' 6"  (1.676 m)   Wt 257 lb (116.6 kg)   LMP 10/04/2018   BMI 41.48 kg/m  Wt Readings from Last 3 Encounters:  10/11/18 257 lb (116.6 kg)  09/21/18 259 lb (117.5 kg)  08/03/18 265 lb (120.2 kg)     CONSTITUTIONAL: Well-developed, well-nourished female in no acute distress.  HENT:  Normocephalic, atraumatic, External right and left ear normal. Oropharynx is clear and moist EYES: Conjunctivae and EOM are normal. Pupils are equal, round, and reactive to light. No scleral icterus.  NECK: Normal range of motion, supple, no masses.  Normal thyroid.   CARDIOVASCULAR: Normal heart rate noted, regular rhythm RESPIRATORY: Clear to auscultation bilaterally. Effort and breath sounds normal, no problems with respiration noted. BREASTS: Symmetric in size. No masses, skin changes, nipple drainage, or lymphadenopathy. ABDOMEN: Soft, normal bowel sounds, no distention noted.  No tenderness, rebound or guarding.  PELVIC: Normal appearing external genitalia; normal appearing vaginal mucosa and cervix.  No abnormal discharge noted.  Normal uterine size, no other palpable masses, no uterine or adnexal tenderness. MUSCULOSKELETAL: Normal range of motion. No tenderness.  No cyanosis, clubbing, or edema.  2+ distal pulses. SKIN: Skin is warm and dry. No rash noted. Not diaphoretic. No erythema. No pallor. NEUROLOGIC: Alert and oriented to person, place, and time. Normal reflexes, muscle tone coordination. No cranial nerve deficit noted. PSYCHIATRIC: Normal mood and affect. Normal behavior. Normal judgment and thought content.  IUD Procedure Note Patient identified, informed consent performed, signed copy in chart, time out was performed.  Urine pregnancy test negative.  Speculum placed in the vagina.  Cervix visualized.  Cleaned with Betadine x 2.   Grasped anteriorly with a single tooth tenaculum.  Attempted to insert IUD through cervix, which was stenotic. Patient requested stopping procedure, which I immediately did.   Assessment:  Annual gynecologic examination with pap smear   Plan:  1. Well Woman Exam Will follow up results of pap smear and manage accordingly. - Cytology - PAP( [place])  2. Dysmenorrhea Attempted IUD insertion, however patient unable to tolerate. Will pretreat with cytotec and have patient return.   3. Pre-procedure lab exam  - POCT urine pregnancy  Routine preventative health maintenance measures emphasized. Please refer to After Visit Summary for other counseling recommendations.    Loma Boston, [place] for [employer]

## 2018-10-16 ENCOUNTER — Ambulatory Visit: Payer: Managed Care, Other (non HMO) | Admitting: Family

## 2018-10-16 LAB — CYTOLOGY - PAP
Diagnosis: NEGATIVE
Diagnosis: REACTIVE
HPV (WINDOPATH): NOT DETECTED

## 2018-10-31 ENCOUNTER — Ambulatory Visit: Payer: Managed Care, Other (non HMO) | Admitting: Family

## 2018-11-07 ENCOUNTER — Other Ambulatory Visit: Payer: Self-pay | Admitting: Family

## 2018-11-07 DIAGNOSIS — R05 Cough: Secondary | ICD-10-CM

## 2018-11-07 DIAGNOSIS — R059 Cough, unspecified: Secondary | ICD-10-CM

## 2018-11-07 DIAGNOSIS — R0600 Dyspnea, unspecified: Secondary | ICD-10-CM

## 2018-11-11 ENCOUNTER — Encounter: Payer: Self-pay | Admitting: Family

## 2018-11-12 ENCOUNTER — Encounter: Payer: Self-pay | Admitting: Family

## 2018-11-23 ENCOUNTER — Telehealth (INDEPENDENT_AMBULATORY_CARE_PROVIDER_SITE_OTHER): Payer: Managed Care, Other (non HMO) | Admitting: Family

## 2018-11-23 DIAGNOSIS — L304 Erythema intertrigo: Secondary | ICD-10-CM

## 2018-11-23 DIAGNOSIS — J302 Other seasonal allergic rhinitis: Secondary | ICD-10-CM

## 2018-11-23 DIAGNOSIS — E1121 Type 2 diabetes mellitus with diabetic nephropathy: Secondary | ICD-10-CM

## 2018-11-23 DIAGNOSIS — C220 Liver cell carcinoma: Secondary | ICD-10-CM | POA: Diagnosis not present

## 2018-11-23 DIAGNOSIS — F32A Depression, unspecified: Secondary | ICD-10-CM

## 2018-11-23 DIAGNOSIS — I10 Essential (primary) hypertension: Secondary | ICD-10-CM | POA: Diagnosis not present

## 2018-11-23 DIAGNOSIS — F329 Major depressive disorder, single episode, unspecified: Secondary | ICD-10-CM

## 2018-11-23 DIAGNOSIS — J45901 Unspecified asthma with (acute) exacerbation: Secondary | ICD-10-CM | POA: Diagnosis not present

## 2018-11-23 DIAGNOSIS — F411 Generalized anxiety disorder: Secondary | ICD-10-CM

## 2018-11-23 MED ORDER — NYSTATIN 100000 UNIT/GM EX POWD: Freq: Two times a day (BID) | CUTANEOUS | 2 refills | Status: DC

## 2018-11-23 MED ORDER — BUPROPION HCL ER (XL) 150 MG PO TB24: 150.0000 mg | ORAL_TABLET | Freq: Every day | ORAL | 1 refills | Status: DC

## 2018-11-23 NOTE — Progress Notes (Addendum)
Virtual Visit via Video Note  I connected with Donia Guiles on 11/23/18 at  7:00 AM EDT by a video enabled telemedicine application and verified that I am speaking with the correct person using two identifiers. This visit type was conducted due to national recommendations for restrictions regarding the COVID-19 Pandemic (e.g. social distancing).  This format is felt to be most appropriate for this patient at this time.   I discussed the limitations of evaluation and management by telemedicine and the availability of in person appointments. The patient expressed understanding and agreed to proceed.  Only the patient and myself were on today's video visit. The patient was at home and I was in my office at the time of today's visit.    History of Present Illness:  Depression/anxiety- reports that her depression symptoms are much better on wellbutrin. Tolerating without any obvious side effects.   Seasonal allergies/asthma- reports that she had a lot of "popping in hr ears."  Audiologist told her she needs hearing aids and referred her to ENT. Reports Dr. Laurance Flatten at [facility] performed a tympanostomy and she had relief of popping. They plan tube placement when they resume elective procedures. Reports that asthma is stable on symbicort/singulair.   HTN- maintained on metoprolol, losartan. Has not been checking bp regularly.  BP Readings from Last 3 Encounters:  10/11/18 120/70  09/21/18 109/68  08/03/18 121/68   Diabetes type 2- reports that her last A1C was 6.1 at [facility]. She is maintained on metformin.  Allergies/asthma- using singulair, flonase, saw Dr. Laurance Flatten at [facility]. Was also told that she needs tubes in her ears  Reports weight is 250. Has a new puppy and he is keeping her more active.   Wt Readings from Last 3 Encounters:  10/11/18 257 lb (116.6 kg)  09/21/18 259 lb (117.5 kg)  08/03/18 265 lb (120.2 kg)    Lab Results  Component Value Date   HGBA1C 7.4 (H) 08/15/2017   HGBA1C 6.9 (H) 07/31/2017   HGBA1C 6.7 (H) 04/20/2017   Lab Results  Component Value Date   CREATININE 0.83 07/30/2018   Reports rash beneath breasts.   Hepatocellular carcinoma- reports lesions are stable and she is being monitored at [facility] with serial MRI's.    Observations/Objective:   Gen: Awake, alert, no acute distress Resp: Breathing is even and non-labored Psych: calm/pleasant demeanor Neuro: Alert and Oriented x 3, + facial symmetry, speech is clear.  Assessment and Plan:   Follow Up Instructions:   DM2- improved, continue weight loss efforts, metformin.  HTN- advised pt to check bp once daily for 1 week and to send me her readings via mychart. Continue current meds/doses for now.  Allergies/asthma- stable, continue current meds.   Intertrigo- rx sent for nystatin powder.   Depression/anxiety- stable. Continue wellbutrin and prn xanax.   Hepatocellular carcinoma- stable, management per heptatology/oncology at [facility].   I discussed the assessment and treatment plan with the patient. The patient was provided an opportunity to ask questions and all were answered. The patient agreed with the plan and demonstrated an understanding of the instructions.   The patient was advised to call back or seek an in-person evaluation if the symptoms worsen or if the condition fails to improve as anticipated.    Nance Pear, NP  Addendum: due for + family hx of breast CA in her father who was in his 10's at time of diagnosis and required radical mastectomy and pt's previous hx of breast biopsy, she should continue  annual MRI of the breast as part of her screening protocol.

## 2018-11-28 ENCOUNTER — Encounter: Payer: Self-pay | Admitting: Family

## 2018-11-28 DIAGNOSIS — Z1239 Encounter for other screening for malignant neoplasm of breast: Secondary | ICD-10-CM

## 2018-11-28 DIAGNOSIS — Z1231 Encounter for screening mammogram for malignant neoplasm of breast: Secondary | ICD-10-CM

## 2018-11-30 ENCOUNTER — Telehealth: Payer: Self-pay | Admitting: Family

## 2018-11-30 NOTE — Telephone Encounter (Signed)
See mychart.  

## 2018-12-10 ENCOUNTER — Telehealth: Payer: Self-pay | Admitting: Family

## 2018-12-10 NOTE — Telephone Encounter (Signed)
[phone]  Spoke to [facility] nurse reviewer. They are requesting that additional clinical information be sent to the above fax number, specifically last office note.  Will ask our scheduler to send my last office note to them.

## 2018-12-24 ENCOUNTER — Other Ambulatory Visit: Payer: Self-pay | Admitting: Family

## 2018-12-24 DIAGNOSIS — R0600 Dyspnea, unspecified: Secondary | ICD-10-CM

## 2018-12-24 DIAGNOSIS — I10 Essential (primary) hypertension: Secondary | ICD-10-CM

## 2019-01-03 ENCOUNTER — Encounter: Payer: Self-pay | Admitting: Family

## 2019-01-04 ENCOUNTER — Other Ambulatory Visit: Payer: Self-pay

## 2019-01-04 ENCOUNTER — Other Ambulatory Visit: Payer: Self-pay | Admitting: Family

## 2019-01-04 ENCOUNTER — Telehealth (INDEPENDENT_AMBULATORY_CARE_PROVIDER_SITE_OTHER): Payer: Managed Care, Other (non HMO) | Admitting: Family

## 2019-01-04 DIAGNOSIS — M2550 Pain in unspecified joint: Secondary | ICD-10-CM

## 2019-01-04 DIAGNOSIS — R5383 Other fatigue: Secondary | ICD-10-CM

## 2019-01-04 DIAGNOSIS — F329 Major depressive disorder, single episode, unspecified: Secondary | ICD-10-CM | POA: Diagnosis not present

## 2019-01-04 DIAGNOSIS — F32A Depression, unspecified: Secondary | ICD-10-CM

## 2019-01-04 LAB — SEDIMENTATION RATE: Sed Rate: 30 mm/hr — ABNORMAL HIGH (ref 0–20)

## 2019-01-04 LAB — COMPREHENSIVE METABOLIC PANEL
ALT: 33 U/L (ref 0–35)
AST: 25 U/L (ref 0–37)
Albumin: 4 g/dL (ref 3.5–5.2)
Alkaline Phosphatase: 92 U/L (ref 39–117)
BUN: 8 mg/dL (ref 6–23)
CO2: 28 mEq/L (ref 19–32)
Calcium: 9.1 mg/dL (ref 8.4–10.5)
Chloride: 103 mEq/L (ref 96–112)
Creatinine, Ser: 0.77 mg/dL (ref 0.40–1.20)
GFR: 83 mL/min (ref >60.00)
Glucose, Bld: 97 mg/dL (ref 70–99)
Potassium: 3.9 mEq/L (ref 3.5–5.1)
Sodium: 140 mEq/L (ref 135–145)
Total Bilirubin: 0.3 mg/dL (ref 0.2–1.2)
Total Protein: 6.5 g/dL (ref 6.0–8.3)

## 2019-01-04 LAB — CBC WITH DIFFERENTIAL/PLATELET
Basophils Absolute: 0.1 10*3/uL (ref 0.0–0.1)
Basophils Relative: 0.7 % (ref 0.0–3.0)
Eosinophils Absolute: 0.2 10*3/uL (ref 0.0–0.7)
Eosinophils Relative: 2.3 % (ref 0.0–5.0)
HCT: 41.5 % (ref 36.0–46.0)
Hemoglobin: 14 g/dL (ref 12.0–15.0)
Lymphocytes Relative: 34.9 % (ref 12.0–46.0)
Lymphs Abs: 3.6 10*3/uL (ref 0.7–4.0)
MCHC: 33.7 g/dL (ref 30.0–36.0)
MCV: 88.4 fl (ref 78.0–100.0)
Monocytes Absolute: 0.6 10*3/uL (ref 0.1–1.0)
Monocytes Relative: 6 % (ref 3.0–12.0)
Neutro Abs: 5.7 10*3/uL (ref 1.4–7.7)
Neutrophils Relative %: 56.1 % (ref 43.0–77.0)
Platelets: 244 10*3/uL (ref 150.0–400.0)
RBC: 4.69 Mil/uL (ref 3.87–5.11)
RDW: 13.4 % (ref 11.5–15.5)
WBC: 10.2 10*3/uL (ref 4.0–10.5)

## 2019-01-04 LAB — TSH: TSH: 1.47 u[IU]/mL (ref 0.35–4.50)

## 2019-01-04 LAB — IRON: Iron: 66 ug/dL (ref 42–145)

## 2019-01-04 LAB — FERRITIN: Ferritin: 68.7 ng/mL (ref 10.0–291.0)

## 2019-01-04 LAB — B12 AND FOLATE PANEL: Folate: 14.2 ng/mL (ref >5.9)

## 2019-01-04 NOTE — Progress Notes (Signed)
Virtual Visit via Video Note  I connected with Stephanie Miles on 01/04/19 at 11:20 AM EDT by a video enabled telemedicine application and verified that I am speaking with the correct person using two identifiers.  Location: Patient: home Provider: office   I discussed the limitations of evaluation and management by telemedicine and the availability of in person appointments. The patient expressed understanding and agreed to proceed.  History of Present Illness:  Patient reports "extreme fatigue." sleeping 9 hrs a night. Napping at lunch during work day.  Reports that she is "unbelievably tired." Eating well, exercising. Feels like her mood is good since starting wellbutrin.   Reports that she has had some bright red blood in her stool,  No tar like stools.  Reports that she sees blood on tissue with wiping about 2x a week.  She reports that she has history of hemorrhoids.  She denies sob, fever, cough.   She does report some chronic joint pain and is concerned that "there is something inflammatory going on."   Past Medical History:  Diagnosis Date  . Allergy   . Anxiety   . Aortic regurgitation due to bicuspid aortic valve   . Arthritis   . Borderline diabetes 10/20/2016  . Chronic kidney disease    kidney stones  . Depression   . Diabetes type 2, controlled ([place]) 10/20/2016  . Family history of breast cancer in female   . Family history of colon cancer   . Fatty liver 11/28/2016  . GERD (gastroesophageal reflux disease)   . Heart murmur   . Hypertension   . IBS (irritable bowel syndrome)   . Insomnia   . Liver cancer ([place]) 03/2017  . Migraine headache   . NASH (nonalcoholic steatohepatitis) 04/04/2017   Per biopsy 9/18  . OCD (obsessive compulsive disorder)   . Orbital cellulitis    at age 47 was hospitalized for 3 months (almost died)   . OSA (obstructive sleep apnea)   . S/P appy 05/2013  . Sleep apnea      Social History   Socioeconomic History  . Marital status:  Single    Spouse name: Not on file  . Number of children: 0  . Years of education: Not on file  . Highest education level: Not on file  Occupational History  . Occupation: Ship broker  . Occupation: Programmer, applications  Social Needs  . Financial resource strain: Not on file  . Food insecurity    Worry: Not on file    Inability: Not on file  . Transportation needs    Medical: Not on file    Non-medical: Not on file  Tobacco Use  . Smoking status: Former Smoker    Packs/day: 1.00    Years: 20.00    Pack years: 20.00    Types: Cigarettes    Quit date: 02/22/2013    Years since quitting: 5.8  . Smokeless tobacco: Never Used  Substance and Sexual Activity  . Alcohol use: Never    Alcohol/week: 0.0 standard drinks    Frequency: Never  . Drug use: No  . Sexual activity: Not Currently  Lifestyle  . Physical activity    Days per week: Not on file    Minutes per session: Not on file  . Stress: Not on file  Relationships  . Social Herbalist on phone: Not on file    Gets together: Not on file    Attends religious service: Not on file  Active member of club or organization: Not on file    Attends meetings of clubs or organizations: Not on file    Relationship status: Not on file  . Intimate partner violence    Fear of current or ex partner: Not on file    Emotionally abused: Not on file    Physically abused: Not on file    Forced sexual activity: Not on file  Other Topics Concern  . Not on file  Social History Narrative   Working at [employer] doing [employer] alone (has a Neurosurgeon)   Working on a degree at [employer], will plan to return fall 2018   Enjoys spending time with family.   Friends and family in [place]    Past Surgical History:  Procedure Laterality Date  . COLONOSCOPY    . DENTAL SURGERY     wisdom teeth  . ESOPHAGOGASTRODUODENOSCOPY    . LAPAROSCOPIC APPENDECTOMY N/A 06/06/2013   Procedure: APPENDECTOMY LAPAROSCOPIC;  Surgeon: Imogene Burn.  Georgette Dover, MD;  Location: [facility];  Service: General;  Laterality: N/A;  . LIVER BIOPSY      Family History  Problem Relation Age of Onset  . Asthma Mother   . Hypertension Mother   . Breast cancer Father 77  . Diabetes Father   . Hypertension Father   . Heart disease Father   . Hyperlipidemia Father   . Asthma Sister   . Hyperlipidemia Brother   . Hypertension Brother   . Vision loss Brother   . Colon cancer Maternal Grandmother 80  . Liver cancer Maternal Grandmother   . Cervical cancer Maternal Aunt   . Stroke Paternal Uncle 52  . Melanoma Maternal Grandfather 15  . Colon polyps Maternal Grandfather   . AAA (abdominal aortic aneurysm) Paternal Grandmother   . Heart disease Paternal Grandfather   . Heart disease Paternal Uncle 37  . Colon cancer Other 71       MGM's mother  . Stomach cancer Other 33       MGM's mother  . Breast cancer Other        MGM's maternal aunt  . Cancer Other        MGM's maternal uncle with GI cancer  . Esophageal cancer Neg Hx   . Pancreatic cancer Neg Hx   . Rectal cancer Neg Hx     Allergies  Allergen Reactions  . Dust Mite Extract Shortness Of Breath and Cough  . Mold Extract [Trichophyton] Shortness Of Breath and Cough  . Nsaids Other (See Comments)    Patient is to NOT have NSAIDS  . Codeine Nausea Only    Required use of an antiemetic   . Amlodipine Swelling and Other (See Comments)    Edema   . Tape Rash    Medical tape and Band-Aids aren't tolerated    Current Outpatient Medications on File Prior to Visit  Medication Sig Dispense Refill  . ACCU-CHEK FASTCLIX LANCETS MISC Check sugar twice daily 102 each 3  . ACCU-CHEK GUIDE test strip USE TO CHECK BLOOD SUGAR TWO TIMES A DAY 100 each 0  . acetaminophen (TYLENOL) 500 MG tablet Take 500-1,000 mg by mouth every 6 (six) hours as needed (for pain).     Marland Kitchen albuterol (PROVENTIL) (2.5 MG/3ML) 0.083% nebulizer solution USE THREE MILLILITERS VIA NEBULIZATION BY MOUTH EVERY 6 HOURS AS  NEEDED FOR SHORTNESS OF BREATH 75 mL 0  . albuterol (VENTOLIN HFA) 108 (90 Base) MCG/ACT inhaler Inhale 2 puffs  into the lungs every 6 (six) hours as needed for wheezing or shortness of breath.    . ALPRAZolam (XANAX) 0.5 MG tablet TAKE ONE TABLET BY MOUTH TWICE A DAY AS NEEDED 45 tablet 0  . Blood Glucose Monitoring Suppl (ACCU-CHEK GUIDE) w/Device KIT 1 each by Does not apply route daily. 1 kit 0  . budesonide-formoterol (SYMBICORT) 160-4.5 MCG/ACT inhaler INHALE TWO PUFFS BY MOUTH TWICE A DAY 10.2 g 0  . buPROPion (WELLBUTRIN XL) 150 MG 24 hr tablet Take 1 tablet (150 mg total) by mouth daily. 90 tablet 1  . cetirizine (ZYRTEC) 10 MG tablet Take 10 mg by mouth daily.    . Cholecalciferol (VITAMIN D3) 50 MCG (2000 UT) TABS Take 2,000 Units by mouth daily.    Marland Kitchen dicyclomine (BENTYL) 20 MG tablet TAKE ONE TABLET BY MOUTH FOUR TIMES A DAY BEFORE MEALS AND AT BEDTIME (Patient taking differently: Take 20 mg by mouth See admin instructions. Take 20 mg by mouth two to three times a day- before meals and at bedtime) 120 tablet 4  . DOXORUBICIN HCL IV Inject into the vein See admin instructions.     Marland Kitchen escitalopram (LEXAPRO) 20 MG tablet Take 1 tablet (20 mg total) by mouth daily. 30 tablet 5  . fluticasone (FLONASE) 50 MCG/ACT nasal spray SPRAY TWO SPRAYS IN THE BOTH NOSTRILS DAILY AS NEEDED FOR ALLERGIES (Patient taking differently: Place 2 sprays into both nostrils daily. ) 16 g 5  . furosemide (LASIX) 40 MG tablet TAKE ONE TABLET BY MOUTH EVERY MORNING 30 tablet 0  . hydrocortisone 2.5 % cream Apply topically 2 (two) times daily. 30 g 0  . hydrocortisone-pramoxine (PROCTOFOAM HC) rectal foam Place 1 applicator rectally 2 (two) times daily. (Patient taking differently: Place 1 applicator rectally 2 (two) times daily as needed for hemorrhoids or anal itching. ) 10 g 0  . losartan (COZAAR) 100 MG tablet Take 1 tablet (100 mg total) by mouth daily. 30 tablet 5  . metFORMIN (GLUCOPHAGE-XR) 500 MG 24 hr tablet  TAKE TWO TABLETS BY MOUTH DAILY WITH BREAKFAST (Patient taking differently: Take 1,000 mg by mouth daily with breakfast. ) 60 tablet 4  . metoprolol succinate (TOPROL-XL) 100 MG 24 hr tablet TAKE ONE TABLET BY MOUTH DAILY WITH OR IMMEDIATELY FOLLOWING A MEAL (Patient taking differently: Take 100 mg by mouth daily. WITH OR IMMEDIATELY FOLLOWING A MEAL) 30 tablet 4  . misoprostol (CYTOTEC) 200 MCG tablet insert four tablets vaginally the night prior to your appointment 4 tablet 1  . montelukast (SINGULAIR) 10 MG tablet TAKE ONE TABLET BY MOUTH AT BEDTIME 30 tablet 3  . Multiple Vitamins-Minerals (MULTIVITAMIN WITH MINERALS) tablet Take 1 tablet by mouth daily.    . nitroGLYCERIN (NITRODUR - DOSED IN MG/24 HR) 0.2 mg/hr patch Apply 1/4th patch to affected shoulder, change daily 30 patch 1  . nystatin (NYSTATIN) powder Apply topically 2 (two) times daily. 30 g 2  . omeprazole (PRILOSEC) 20 MG capsule TAKE TWO CAPSULES BY MOUTH EVERY MORNING BEFORE BREAKFAST AND 1 CAPSULE EVERY EVENING BEFORE SUPPER 90 capsule 4  . ondansetron (ZOFRAN ODT) 4 MG disintegrating tablet Take 1 tablet (4 mg total) by mouth every 8 (eight) hours as needed for nausea or vomiting. 30 tablet 0  . oxyCODONE (ROXICODONE) 5 MG immediate release tablet Take 1 tablet (5 mg total) by mouth every 4 (four) hours as needed for severe pain. 30 tablet 0  . potassium chloride (K-DUR,KLOR-CON) 10 MEQ tablet Take 1 tablet (10 mEq total)  by mouth every other day. (Patient taking differently: Take 10 mEq by mouth See admin instructions. Take 10 mEq by mouth at bedtime on even-numbered nights) 15 tablet 5  . Prenatal Vit-Fe Fumarate-FA (MULTIVITAMIN-PRENATAL) 27-0.8 MG TABS tablet Take 1 tablet by mouth daily.     . SUMAtriptan (IMITREX) 50 MG tablet Take 1 tablet (50 mg total) by mouth every 2 (two) hours as needed for migraine. May repeat in 2 hours if headache persists or recurs. 10 tablet 0   No current facility-administered medications on file  prior to visit.     There were no vitals taken for this visit.      Observations/Objective:   Gen: Awake, alert, no acute distress Resp: Breathing is even and non-labored Psych: calm/pleasant demeanor Neuro: Alert and Oriented x 3, + facial symmetry, speech is clear.  Assessment and Plan:  Fatigue- will obtain cbc, bmet, lft, tsh, b12/folate   Depression- stable on lexapro/wellbutrin, continue same.  Joint pain- check ANA, RA, ESR to further evaluate for underlying autoimmune etiology for her fatigue.  Follow Up Instructions:    I discussed the assessment and treatment plan with the patient. The patient was provided an opportunity to ask questions and all were answered. The patient agreed with the plan and demonstrated an understanding of the instructions.   The patient was advised to call back or seek an in-person evaluation if the symptoms worsen or if the condition fails to improve as anticipated.  Nance Pear, NP

## 2019-01-07 ENCOUNTER — Telehealth: Payer: Self-pay | Admitting: Family

## 2019-01-07 DIAGNOSIS — G4733 Obstructive sleep apnea (adult) (pediatric): Secondary | ICD-10-CM

## 2019-01-07 NOTE — Telephone Encounter (Signed)
Spoke with pt, reviewed lab work.  Also discussed OSA. She reports good compliance.  I would like to get her back in with her sleep specialist so that her settings can be reviewed to make sure they are optimized and not contributing to her daytime somnolence.  She is agreeable. Referral has been placed.

## 2019-01-08 ENCOUNTER — Encounter: Payer: Self-pay | Admitting: Family

## 2019-01-08 DIAGNOSIS — R768 Other specified abnormal immunological findings in serum: Secondary | ICD-10-CM

## 2019-01-08 LAB — RHEUMATOID FACTOR: Rheumatoid fact SerPl-aCnc: <14 IU/mL (ref <14)

## 2019-01-08 LAB — ANA: Anti Nuclear Antibody (ANA): POSITIVE — AB

## 2019-01-08 LAB — ANTI-NUCLEAR AB-TITER (ANA TITER): ANA Titer 1: 1:80 {titer} — ABNORMAL HIGH

## 2019-01-11 ENCOUNTER — Emergency Department (HOSPITAL_BASED_OUTPATIENT_CLINIC_OR_DEPARTMENT_OTHER)
Admission: EM | Admit: 2019-01-11 | Discharge: 2019-01-11 | Disposition: A | Discharge status: Home / Self Care | Payer: Managed Care, Other (non HMO) | Attending: Emergency Medicine | Admitting: Emergency Medicine

## 2019-01-11 ENCOUNTER — Other Ambulatory Visit: Payer: Self-pay

## 2019-01-11 ENCOUNTER — Emergency Department (HOSPITAL_BASED_OUTPATIENT_CLINIC_OR_DEPARTMENT_OTHER): Payer: Managed Care, Other (non HMO)

## 2019-01-11 ENCOUNTER — Encounter (HOSPITAL_BASED_OUTPATIENT_CLINIC_OR_DEPARTMENT_OTHER): Payer: Self-pay | Admitting: Emergency Medicine

## 2019-01-11 DIAGNOSIS — W010XXA Fall on same level from slipping, tripping and stumbling without subsequent striking against object, initial encounter: Secondary | ICD-10-CM | POA: Diagnosis not present

## 2019-01-11 DIAGNOSIS — Z87891 Personal history of nicotine dependence: Secondary | ICD-10-CM | POA: Diagnosis not present

## 2019-01-11 DIAGNOSIS — E1122 Type 2 diabetes mellitus with diabetic chronic kidney disease: Secondary | ICD-10-CM | POA: Insufficient documentation

## 2019-01-11 DIAGNOSIS — Z8505 Personal history of malignant neoplasm of liver: Secondary | ICD-10-CM | POA: Diagnosis not present

## 2019-01-11 DIAGNOSIS — Z79899 Other long term (current) drug therapy: Secondary | ICD-10-CM | POA: Insufficient documentation

## 2019-01-11 DIAGNOSIS — I129 Hypertensive chronic kidney disease with stage 1 through stage 4 chronic kidney disease, or unspecified chronic kidney disease: Secondary | ICD-10-CM | POA: Diagnosis not present

## 2019-01-11 DIAGNOSIS — Y929 Unspecified place or not applicable: Secondary | ICD-10-CM | POA: Insufficient documentation

## 2019-01-11 DIAGNOSIS — Y939 Activity, unspecified: Secondary | ICD-10-CM | POA: Diagnosis not present

## 2019-01-11 DIAGNOSIS — N189 Chronic kidney disease, unspecified: Secondary | ICD-10-CM | POA: Insufficient documentation

## 2019-01-11 DIAGNOSIS — S93402A Sprain of unspecified ligament of left ankle, initial encounter: Secondary | ICD-10-CM | POA: Diagnosis not present

## 2019-01-11 DIAGNOSIS — Z7984 Long term (current) use of oral hypoglycemic drugs: Secondary | ICD-10-CM | POA: Insufficient documentation

## 2019-01-11 DIAGNOSIS — Y999 Unspecified external cause status: Secondary | ICD-10-CM | POA: Diagnosis not present

## 2019-01-11 DIAGNOSIS — S99912A Unspecified injury of left ankle, initial encounter: Secondary | ICD-10-CM | POA: Diagnosis present

## 2019-01-11 NOTE — ED Triage Notes (Signed)
BIB [facility] 1930 Trip and fall felt left ankle pop. Pt wrapped ankle and took an oxy 75m around 2000.

## 2019-01-11 NOTE — ED Notes (Signed)
PT states took and oxycodone pta

## 2019-01-11 NOTE — ED Notes (Signed)
Patient transported to X-ray 

## 2019-01-11 NOTE — ED Provider Notes (Signed)
[facility] DEPT [facility] Provider Note: Georgena Spurling, MD, FACEP  CSN: [id number] MRN: [id number] ARRIVAL: 01/11/19 at [place]: [place]  Ankle Injury   HISTORY OF PRESENT ILLNESS  01/11/19 3:24 AM Stephanie Miles is a 38 y.o. female who tripped and fell yesterday evening about 7:30 PM.  She inverted her left ankle and felt a pop.  She has subsequently developed pain in the lateral aspect of that ankle which she rates as an 8 out of 10.  She is unable to bear weight on it.  There is some associated swelling which is improved after applying an Ace bandage.  She did take oxycodone about 8 PM with partial relief.  She denies other injury.   Past Medical History:  Diagnosis Date  . Allergy   . Anxiety   . Aortic regurgitation due to bicuspid aortic valve   . Arthritis   . Borderline diabetes 10/20/2016  . Chronic kidney disease    kidney stones  . Depression   . Diabetes type 2, controlled ([place]) 10/20/2016  . Family history of breast cancer in female   . Family history of colon cancer   . Fatty liver 11/28/2016  . GERD (gastroesophageal reflux disease)   . Heart murmur   . Hypertension   . IBS (irritable bowel syndrome)   . Insomnia   . Liver cancer ([place]) 03/2017  . Migraine headache   . NASH (nonalcoholic steatohepatitis) 04/04/2017   Per biopsy 9/18  . OCD (obsessive compulsive disorder)   . Orbital cellulitis    at age 57 was hospitalized for 3 months (almost died)   . OSA (obstructive sleep apnea)   . S/P appy 05/2013  . Sleep apnea     Past Surgical History:  Procedure Laterality Date  . COLONOSCOPY    . DENTAL SURGERY     wisdom teeth  . ESOPHAGOGASTRODUODENOSCOPY    . LAPAROSCOPIC APPENDECTOMY N/A 06/06/2013   Procedure: APPENDECTOMY LAPAROSCOPIC;  Surgeon: Imogene Burn. Georgette Dover, MD;  Location: [facility];  Service: General;  Laterality: N/A;  . LIVER BIOPSY      Family History  Problem Relation Age of Onset  . Asthma Mother   . Hypertension  Mother   . Breast cancer Father 45  . Diabetes Father   . Hypertension Father   . Heart disease Father   . Hyperlipidemia Father   . Asthma Sister   . Hyperlipidemia Brother   . Hypertension Brother   . Vision loss Brother   . Colon cancer Maternal Grandmother 55  . Liver cancer Maternal Grandmother   . Cervical cancer Maternal Aunt   . Stroke Paternal Uncle 50  . Melanoma Maternal Grandfather 25  . Colon polyps Maternal Grandfather   . AAA (abdominal aortic aneurysm) Paternal Grandmother   . Heart disease Paternal Grandfather   . Heart disease Paternal Uncle 23  . Colon cancer Other 19       MGM's mother  . Stomach cancer Other 19       MGM's mother  . Breast cancer Other        MGM's maternal aunt  . Cancer Other        MGM's maternal uncle with GI cancer  . Esophageal cancer Neg Hx   . Pancreatic cancer Neg Hx   . Rectal cancer Neg Hx     Social History   Tobacco Use  . Smoking status: Former Smoker    Packs/day: 1.00    Years: 20.00  Pack years: 20.00    Types: Cigarettes    Quit date: 02/22/2013    Years since quitting: 5.8  . Smokeless tobacco: Never Used  Substance Use Topics  . Alcohol use: Never    Alcohol/week: 0.0 standard drinks    Frequency: Never  . Drug use: No    Prior to Admission medications   Medication Sig Start Date End Date Taking? Authorizing Provider  ACCU-CHEK FASTCLIX LANCETS MISC Check sugar twice daily 06/20/18   Debbrah Alar, NP  ACCU-CHEK GUIDE test strip USE TO CHECK BLOOD SUGAR TWO TIMES A DAY 12/26/18   Debbrah Alar, NP  acetaminophen (TYLENOL) 500 MG tablet Take 500-1,000 mg by mouth every 6 (six) hours as needed (for pain).     [provider]  albuterol (PROVENTIL) (2.5 MG/3ML) 0.083% nebulizer solution USE THREE MILLILITERS VIA NEBULIZATION BY MOUTH EVERY 6 HOURS AS NEEDED FOR SHORTNESS OF BREATH 12/26/18   Debbrah Alar, NP  albuterol (VENTOLIN HFA) 108 (90 Base) MCG/ACT inhaler Inhale 2 puffs into  the lungs every 6 (six) hours as needed for wheezing or shortness of breath.    [provider]  ALPRAZolam Duanne Moron) 0.5 MG tablet TAKE ONE TABLET BY MOUTH TWICE A DAY AS NEEDED 01/07/19   Debbrah Alar, NP  Blood Glucose Monitoring Suppl (ACCU-CHEK GUIDE) w/Device KIT 1 each by Does not apply route daily. 01/09/17   Debbrah Alar, NP  budesonide-formoterol (SYMBICORT) 160-4.5 MCG/ACT inhaler INHALE TWO PUFFS BY MOUTH TWICE A DAY 12/26/18   Debbrah Alar, NP  buPROPion (WELLBUTRIN XL) 150 MG 24 hr tablet Take 1 tablet (150 mg total) by mouth daily. 11/23/18   Debbrah Alar, NP  cetirizine (ZYRTEC) 10 MG tablet Take 10 mg by mouth daily.    [provider]  Cholecalciferol (VITAMIN D3) 50 MCG (2000 UT) TABS Take 2,000 Units by mouth daily.    [provider]  dicyclomine (BENTYL) 20 MG tablet TAKE ONE TABLET BY MOUTH FOUR TIMES A DAY BEFORE MEALS AND AT BEDTIME Patient taking differently: Take 20 mg by mouth See admin instructions. Take 20 mg by mouth two to three times a day- before meals and at bedtime 06/20/18   Debbrah Alar, NP  DOXORUBICIN HCL IV Inject into the vein See admin instructions.     [provider]  escitalopram (LEXAPRO) 20 MG tablet Take 1 tablet (20 mg total) by mouth daily. 07/26/18   Debbrah Alar, NP  fluticasone (FLONASE) 50 MCG/ACT nasal spray SPRAY TWO SPRAYS IN THE BOTH NOSTRILS DAILY AS NEEDED FOR ALLERGIES Patient taking differently: Place 2 sprays into both nostrils daily.  06/13/18   Debbrah Alar, NP  furosemide (LASIX) 40 MG tablet TAKE ONE TABLET BY MOUTH EVERY MORNING 12/26/18   Debbrah Alar, NP  hydrocortisone 2.5 % cream Apply topically 2 (two) times daily. 06/12/18 06/12/19  Colon Branch, MD  hydrocortisone-pramoxine (PROCTOFOAM [facility]) rectal foam Place 1 applicator rectally 2 (two) times daily. Patient taking differently: Place 1 applicator rectally 2 (two) times daily as needed for  hemorrhoids or anal itching.  06/05/18   Debbrah Alar, NP  losartan (COZAAR) 100 MG tablet Take 1 tablet (100 mg total) by mouth daily. 05/17/18   Debbrah Alar, NP  metFORMIN (GLUCOPHAGE-XR) 500 MG 24 hr tablet TAKE TWO TABLETS BY MOUTH DAILY WITH BREAKFAST Patient taking differently: Take 1,000 mg by mouth daily with breakfast.  06/20/18   Debbrah Alar, NP  metoprolol succinate (TOPROL-XL) 100 MG 24 hr tablet TAKE ONE TABLET BY MOUTH DAILY WITH  OR IMMEDIATELY FOLLOWING A MEAL Patient taking differently: Take 100 mg by mouth daily. WITH OR IMMEDIATELY FOLLOWING A MEAL 06/20/18   Debbrah Alar, NP  misoprostol (CYTOTEC) 200 MCG tablet insert four tablets vaginally the night prior to your appointment 10/11/18   Truett Mainland, DO  montelukast (SINGULAIR) 10 MG tablet TAKE ONE TABLET BY MOUTH AT BEDTIME 11/07/18   Debbrah Alar, NP  Multiple Vitamins-Minerals (MULTIVITAMIN WITH MINERALS) tablet Take 1 tablet by mouth daily.    [provider]  nitroGLYCERIN (NITRODUR - DOSED IN MG/24 HR) 0.2 mg/hr patch Apply 1/4th patch to affected shoulder, change daily 03/16/18   Hudnall, Sharyn Lull, MD  nystatin (NYSTATIN) powder Apply topically 2 (two) times daily. 11/23/18   Debbrah Alar, NP  omeprazole (PRILOSEC) 20 MG capsule TAKE TWO CAPSULES BY MOUTH EVERY MORNING BEFORE BREAKFAST AND 1 CAPSULE EVERY EVENING BEFORE SUPPER 10/07/18   Debbrah Alar, NP  ondansetron (ZOFRAN ODT) 4 MG disintegrating tablet Take 1 tablet (4 mg total) by mouth every 8 (eight) hours as needed for nausea or vomiting. 01/31/18   Debbrah Alar, NP  oxyCODONE (ROXICODONE) 5 MG immediate release tablet Take 1 tablet (5 mg total) by mouth every 4 (four) hours as needed for severe pain. 02/13/18   Hudnall, Sharyn Lull, MD  potassium chloride (K-DUR,KLOR-CON) 10 MEQ tablet Take 1 tablet (10 mEq total) by mouth every other day. Patient taking differently: Take 10 mEq by mouth See admin  instructions. Take 10 mEq by mouth at bedtime on even-numbered nights 07/26/18   Debbrah Alar, NP  Prenatal Vit-Fe Fumarate-FA (MULTIVITAMIN-PRENATAL) 27-0.8 MG TABS tablet Take 1 tablet by mouth daily.     [provider]  SUMAtriptan (IMITREX) 50 MG tablet Take 1 tablet (50 mg total) by mouth every 2 (two) hours as needed for migraine. May repeat in 2 hours if headache persists or recurs. 10/17/16   Debbrah Alar, NP    Allergies Dust mite extract, Mold extract [trichophyton], Nsaids, Codeine, Amlodipine, and Tape   REVIEW OF SYSTEMS  Negative except as noted here or in the History of Present Illness.   PHYSICAL EXAMINATION  Initial Vital Signs Blood pressure 131/66, pulse 68, temperature 98.5 F (36.9 C), temperature source Oral, resp. rate 16, height 5' 6"  (1.676 m), weight 113.4 kg, last menstrual period 12/24/2018, SpO2 96 %.  Examination General: Well-developed, well-nourished female in no acute distress; appearance consistent with age of record HENT: normocephalic; atraumatic Eyes: Normal appearance Neck: supple Heart: regular rate and rhythm Lungs: clear to auscultation bilaterally Abdomen: soft; nondistended; nontender; no masses or hepatosplenomegaly; bowel sounds present Extremities: No deformity; full range of motion except left ankle not tested; pulses normal; tenderness of lateral aspect of left ankle, left foot distally neurovascularly intact Neurologic: Awake, alert and oriented; motor function intact in all extremities and symmetric; no facial droop Skin: Warm and dry Psychiatric: Normal mood and affect   RESULTS  Summary of this visit's results, reviewed by myself:   EKG Interpretation  Date/Time:    Ventricular Rate:    PR Interval:    QRS Duration:   QT Interval:    QTC Calculation:   R Axis:     Text Interpretation:        Laboratory Studies: No results found for this or any previous visit (from the past 24 hour(s)). Imaging  Studies: Dg Ankle Complete Left  Result Date: 01/11/2019 CLINICAL DATA:  Fall, lateral ankle pain EXAM: LEFT ANKLE COMPLETE - 3+ VIEW COMPARISON:  None. FINDINGS: There  is no evidence of fracture, dislocation, or joint effusion. There is no evidence of arthropathy or other focal bone abnormality. Soft tissues are unremarkable. IMPRESSION: Negative. Electronically Signed   By: Rolm Baptise M.D.   On: 01/11/2019 02:43    ED COURSE and MDM  Nursing notes and initial vitals signs, including pulse oximetry, reviewed.  Vitals:   01/11/19 0204 01/11/19 0209 01/11/19 0215  BP:   131/66  Pulse:   68  Resp:   16  Temp:   98.5 F (36.9 C)  TempSrc:   Oral  SpO2: 97%  96%  Weight:  113.4 kg   Height:  5' 6"  (1.676 m)    Patient placed in ASO.  We will provide crutches.  Patient states she already has an adequate supply of oxycodone.  PROCEDURES    ED DIAGNOSES     ICD-10-CM   1. Sprain of left ankle, unspecified ligament, initial encounter  P16.742D        Shanon Rosser, MD 01/11/19 [phone]

## 2019-01-21 ENCOUNTER — Telehealth: Payer: Self-pay | Admitting: Family

## 2019-01-21 NOTE — Telephone Encounter (Signed)
All labs from 01/04/19 faxed to [facility] at fax # provided.

## 2019-01-21 NOTE — Telephone Encounter (Signed)
Received request from referral coordinator Shanon Brow at [facility] requesting all recent lab work including ANA be faxed to them prior to scheduling patient.  Their fax number is [phone]. Can you please fax all of her 6/12 labs to them? Thans.

## 2019-01-21 NOTE — Telephone Encounter (Signed)
Letter faxed to [facility] healthcare

## 2019-01-21 NOTE — Telephone Encounter (Signed)
Please see letter I wrote for her insurance appeal.  Please fax or mail letter.

## 2019-01-23 ENCOUNTER — Other Ambulatory Visit: Payer: Self-pay

## 2019-01-23 ENCOUNTER — Ambulatory Visit
Admission: RE | Admit: 2019-01-23 | Discharge: 2019-01-23 | Disposition: A | Discharge status: Home / Self Care | Payer: Managed Care, Other (non HMO) | Source: Ambulatory Visit | Attending: Family | Admitting: Family

## 2019-01-23 DIAGNOSIS — Z1239 Encounter for other screening for malignant neoplasm of breast: Secondary | ICD-10-CM

## 2019-01-23 DIAGNOSIS — Z1231 Encounter for screening mammogram for malignant neoplasm of breast: Secondary | ICD-10-CM

## 2019-02-02 ENCOUNTER — Other Ambulatory Visit: Payer: Self-pay | Admitting: Family

## 2019-02-02 DIAGNOSIS — K580 Irritable bowel syndrome with diarrhea: Secondary | ICD-10-CM

## 2019-02-02 DIAGNOSIS — I10 Essential (primary) hypertension: Secondary | ICD-10-CM

## 2019-02-02 DIAGNOSIS — R0600 Dyspnea, unspecified: Secondary | ICD-10-CM

## 2019-02-02 DIAGNOSIS — E1165 Type 2 diabetes mellitus with hyperglycemia: Secondary | ICD-10-CM

## 2019-02-03 MED ORDER — LOSARTAN POTASSIUM 100 MG PO TABS: 100.0000 mg | ORAL_TABLET | Freq: Every day | ORAL | 1 refills | Status: DC

## 2019-02-06 ENCOUNTER — Encounter: Payer: Self-pay | Admitting: Family

## 2019-02-06 DIAGNOSIS — S6992XA Unspecified injury of left wrist, hand and finger(s), initial encounter: Secondary | ICD-10-CM

## 2019-02-08 ENCOUNTER — Encounter: Payer: Self-pay | Admitting: Family

## 2019-02-08 ENCOUNTER — Ambulatory Visit (HOSPITAL_BASED_OUTPATIENT_CLINIC_OR_DEPARTMENT_OTHER)
Admission: RE | Admit: 2019-02-08 | Discharge: 2019-02-08 | Disposition: A | Discharge status: Home / Self Care | Payer: Managed Care, Other (non HMO) | Source: Ambulatory Visit | Attending: Family | Admitting: Family

## 2019-02-08 ENCOUNTER — Other Ambulatory Visit: Payer: Self-pay

## 2019-02-08 DIAGNOSIS — S6992XA Unspecified injury of left wrist, hand and finger(s), initial encounter: Secondary | ICD-10-CM | POA: Insufficient documentation

## 2019-02-20 ENCOUNTER — Other Ambulatory Visit: Payer: Self-pay | Admitting: Family

## 2019-02-26 ENCOUNTER — Ambulatory Visit (INDEPENDENT_AMBULATORY_CARE_PROVIDER_SITE_OTHER): Payer: Managed Care, Other (non HMO) | Admitting: Family

## 2019-02-26 ENCOUNTER — Other Ambulatory Visit: Payer: Self-pay

## 2019-02-26 ENCOUNTER — Encounter: Payer: Self-pay | Admitting: Family

## 2019-02-26 DIAGNOSIS — F329 Major depressive disorder, single episode, unspecified: Secondary | ICD-10-CM

## 2019-02-26 DIAGNOSIS — E1121 Type 2 diabetes mellitus with diabetic nephropathy: Secondary | ICD-10-CM | POA: Diagnosis not present

## 2019-02-26 DIAGNOSIS — R5383 Other fatigue: Secondary | ICD-10-CM | POA: Diagnosis not present

## 2019-02-26 DIAGNOSIS — I10 Essential (primary) hypertension: Secondary | ICD-10-CM

## 2019-02-26 DIAGNOSIS — F32A Depression, unspecified: Secondary | ICD-10-CM

## 2019-02-26 MED ORDER — CLOTRIMAZOLE-BETAMETHASONE 1-0.05 % EX CREA: 1.0000 "application " | TOPICAL_CREAM | Freq: Two times a day (BID) | CUTANEOUS | 1 refills | Status: DC

## 2019-02-26 NOTE — Progress Notes (Signed)
Subjective:    Patient ID: Stephanie Miles, female    DOB: 04/28/1981, 38 y.o.   MRN: [id number]  HPI   Patient is a 38 yr old female who presents today for follow up.   Still complaining of severe fatigue. She saw Dr. Amil Amen. Did some blood work. Has follow up on 03/05/19.   DM2- walking regularly. She continues metformin. Reports weight steady at 250.   Lab Results  Component Value Date   HGBA1C 7.4 (H) 08/15/2017   HGBA1C 6.9 (H) 07/31/2017   HGBA1C 6.7 (H) 04/20/2017   Lab Results  Component Value Date   CREATININE 0.77 01/04/2019   HTN- reports that her bp is usually normal. 110/80 at her Rheumatology appointment.   Depression- wellbutrin has helped a lot with her depression symptoms during her menstrual cycle. She is also maintained on lexapro 47m. She is trying to stay connected with her family through zoom.  Review of Systems See HPI  Past Medical History:  Diagnosis Date  . Allergy   . Anxiety   . Aortic regurgitation due to bicuspid aortic valve   . Arthritis   . Borderline diabetes 10/20/2016  . Chronic kidney disease    kidney stones  . Depression   . Diabetes type 2, controlled ([place] 10/20/2016  . Family history of breast cancer in female   . Family history of colon cancer   . Fatty liver 11/28/2016  . GERD (gastroesophageal reflux disease)   . Heart murmur   . Hypertension   . IBS (irritable bowel syndrome)   . Insomnia   . [place] 03/2017  . Migraine headache   . NASH (nonalcoholic steatohepatitis) 04/04/2017   Per biopsy 9/18  . OCD (obsessive compulsive disorder)   . Orbital cellulitis    at age [age]was hospitalized for 3 months (almost died)   . OSA (obstructive sleep apnea)   . S/P appy 05/2013  . Sleep apnea      Social History   Socioeconomic History  . Marital status: Single    Spouse name: Not on file  . Number of children: 0  . Years of education: Not on file  . Highest education level: Not on file  Occupational  History  . Occupation: SShip broker . Occupation: MProgrammer, applications Social Needs  . Financial resource strain: Not on file  . Food insecurity    Worry: Not on file    Inability: Not on file  . Transportation needs    Medical: Not on file    Non-medical: Not on file  Tobacco Use  . Smoking status: Former Smoker    Packs/day: 1.00    Years: 20.00    Pack years: 20.00    Types: Cigarettes    Quit date: 02/22/2013    Years since quitting: 6.0  . Smokeless tobacco: Never Used  Substance and Sexual Activity  . Alcohol use: Never    Alcohol/week: 0.0 standard drinks    Frequency: Never  . Drug use: No  . Sexual activity: Not Currently  Lifestyle  . Physical activity    Days per week: Not on file    Minutes per session: Not on file  . Stress: Not on file  Relationships  . Social [place] phone: Not on file    Gets together: Not on file    Attends religious service: Not on file    Active member of club or organization: Not on file  Attends meetings of clubs or organizations: Not on file    Relationship status: Not on file  . Intimate partner violence    Fear of current or ex partner: Not on file    Emotionally abused: Not on file    Physically abused: Not on file    Forced sexual activity: Not on file  Other Topics Concern  . Not on file  Social History Narrative   Working at [employer] doing [employer] alone (has a Neurosurgeon)   Working on a degree at [employer], will plan to return fall 2018   Enjoys spending time with family.   Friends and family in [place]    Past Surgical History:  Procedure Laterality Date  . COLONOSCOPY    . DENTAL SURGERY     wisdom teeth  . ESOPHAGOGASTRODUODENOSCOPY    . LAPAROSCOPIC APPENDECTOMY N/A 06/06/2013   Procedure: APPENDECTOMY LAPAROSCOPIC;  Surgeon: Imogene Burn. Georgette Dover, MD;  Location: [facility];  Service: General;  Laterality: N/A;  . LIVER BIOPSY      Family History  Problem Relation Age of Onset  . Asthma Mother    . Hypertension Mother   . Breast cancer Father 101  . Diabetes Father   . Hypertension Father   . Heart disease Father   . Hyperlipidemia Father   . Asthma Sister   . Hyperlipidemia Brother   . Hypertension Brother   . Vision loss Brother   . Colon cancer Maternal Grandmother 83  . Liver cancer Maternal Grandmother   . Cervical cancer Maternal Aunt   . Stroke Paternal Uncle 4  . Melanoma Maternal Grandfather 65  . Colon polyps Maternal Grandfather   . AAA (abdominal aortic aneurysm) Paternal Grandmother   . Heart disease Paternal Grandfather   . Heart disease Paternal Uncle 8  . Colon cancer Other 67       MGM's mother  . Stomach cancer Other 65       MGM's mother  . Breast cancer Other        MGM's maternal aunt  . Cancer Other        MGM's maternal uncle with GI cancer  . Esophageal cancer Neg Hx   . Pancreatic cancer Neg Hx   . Rectal cancer Neg Hx     Allergies  Allergen Reactions  . Dust Mite Extract Shortness Of Breath and Cough  . Mold Extract [Trichophyton] Shortness Of Breath and Cough  . Nsaids Other (See Comments)    Patient is to NOT have NSAIDS  . Codeine Nausea Only    Required use of an antiemetic   . Amlodipine Swelling and Other (See Comments)    Edema   . Tape Rash    Medical tape and Band-Aids aren't tolerated    Current Outpatient Medications on File Prior to Visit  Medication Sig Dispense Refill  . ACCU-CHEK FASTCLIX LANCETS MISC Check sugar twice daily 102 each 3  . ACCU-CHEK GUIDE test strip USE TO CHECK BLOOD SUGAR TWO TIMES A DAY 100 each 0  . acetaminophen (TYLENOL) 500 MG tablet Take 500-1,000 mg by mouth every 6 (six) hours as needed (for pain).     Marland Kitchen albuterol (PROVENTIL) (2.5 MG/3ML) 0.083% nebulizer solution USE THREE MILLILITERS VIA NEBULIZATION BY MOUTH EVERY 6 HOURS AS NEEDED FOR SHORTNESS OF BREATH 75 mL 0  . albuterol (VENTOLIN HFA) 108 (90 Base) MCG/ACT inhaler Inhale 2 puffs into the lungs every 6 (six) hours as needed for  wheezing or  shortness of breath.    . ALPRAZolam (XANAX) 0.5 MG tablet TAKE ONE TABLET BY MOUTH TWICE A DAY AS NEEDED 45 tablet 0  . Blood Glucose Monitoring Suppl (ACCU-CHEK GUIDE) w/Device KIT 1 each by Does not apply route daily. 1 kit 0  . budesonide-formoterol (SYMBICORT) 160-4.5 MCG/ACT inhaler INHALE TWO PUFFS BY MOUTH TWICE A DAY 10.2 g 5  . buPROPion (WELLBUTRIN XL) 150 MG 24 hr tablet Take 1 tablet (150 mg total) by mouth daily. 90 tablet 1  . cetirizine (ZYRTEC) 10 MG tablet Take 10 mg by mouth daily.    . Cholecalciferol (VITAMIN D3) 50 MCG (2000 UT) TABS Take 2,000 Units by mouth daily.    Marland Kitchen dicyclomine (BENTYL) 20 MG tablet TAKE ONE TABLET BY MOUTH FOUR TIMES A DAY BEFORE MEALS AND AT BEDTIME 120 tablet 3  . DOXORUBICIN HCL IV Inject into the vein See admin instructions.     Marland Kitchen escitalopram (LEXAPRO) 20 MG tablet TAKE ONE TABLET BY MOUTH DAILY 90 tablet 1  . fluticasone (FLONASE) 50 MCG/ACT nasal spray SPRAY TWO SPRAYS IN THE BOTH NOSTRILS DAILY AS NEEDED FOR ALLERGIES (Patient taking differently: Place 2 sprays into both nostrils daily. ) 16 g 5  . furosemide (LASIX) 40 MG tablet TAKE ONE TABLET BY MOUTH EVERY MORNING 90 tablet 1  . hydrocortisone 2.5 % cream Apply topically 2 (two) times daily. 30 g 0  . hydrocortisone-pramoxine (PROCTOFOAM [facility]) rectal foam Place 1 applicator rectally 2 (two) times daily. (Patient taking differently: Place 1 applicator rectally 2 (two) times daily as needed for hemorrhoids or anal itching. ) 10 g 0  . losartan (COZAAR) 100 MG tablet Take 1 tablet (100 mg total) by mouth daily. 90 tablet 1  . metFORMIN (GLUCOPHAGE-XR) 500 MG 24 hr tablet TAKE TWO TABLETS BY MOUTH DAILY WITH BREAKFAST 180 tablet 1  . metoprolol succinate (TOPROL-XL) 100 MG 24 hr tablet Take 1 tablet (100 mg total) by mouth daily. WITH OR IMMEDIATELY FOLLOWING A MEAL 90 tablet 1  . misoprostol (CYTOTEC) 200 MCG tablet insert four tablets vaginally the night prior to your appointment 4  tablet 1  . montelukast (SINGULAIR) 10 MG tablet TAKE ONE TABLET BY MOUTH AT BEDTIME 30 tablet 3  . Multiple Vitamins-Minerals (MULTIVITAMIN WITH MINERALS) tablet Take 1 tablet by mouth daily.    . nitroGLYCERIN (NITRODUR - DOSED IN MG/24 HR) 0.2 mg/hr patch Apply 1/4th patch to affected shoulder, change daily 30 patch 1  . nystatin (NYSTATIN) powder Apply topically 2 (two) times daily. 30 g 2  . omeprazole (PRILOSEC) 20 MG capsule TAKE TWO CAPSULES BY MOUTH EVERY MORNING BEFORE BREAKFAST AND 1 CAPSULE EVERY EVENING BEFORE SUPPER 90 capsule 4  . ondansetron (ZOFRAN ODT) 4 MG disintegrating tablet Take 1 tablet (4 mg total) by mouth every 8 (eight) hours as needed for nausea or vomiting. 30 tablet 0  . oxyCODONE (ROXICODONE) 5 MG immediate release tablet Take 1 tablet (5 mg total) by mouth every 4 (four) hours as needed for severe pain. 30 tablet 0  . potassium chloride (K-DUR,KLOR-CON) 10 MEQ tablet Take 1 tablet (10 mEq total) by mouth every other day. (Patient taking differently: Take 10 mEq by mouth See admin instructions. Take 10 mEq by mouth at bedtime on even-numbered nights) 15 tablet 5  . Prenatal Vit-Fe Fumarate-FA (MULTIVITAMIN-PRENATAL) 27-0.8 MG TABS tablet Take 1 tablet by mouth daily.     . SUMAtriptan (IMITREX) 50 MG tablet Take 1 tablet (50 mg total) by mouth every 2 (two)  hours as needed for migraine. May repeat in 2 hours if headache persists or recurs. 10 tablet 0   No current facility-administered medications on file prior to visit.     There were no vitals taken for this visit.      Objective:   Physical Exam Constitutional:      Appearance: She is well-developed.  Neck:     Musculoskeletal: Neck supple.     Thyroid: No thyromegaly.  Cardiovascular:     Rate and Rhythm: Normal rate and regular rhythm.     Heart sounds: Normal heart sounds. No murmur.  Pulmonary:     Effort: Pulmonary effort is normal. No respiratory distress.     Breath sounds: Normal breath  sounds. No wheezing.  Skin:    General: Skin is warm and dry.  Neurological:     Mental Status: She is alert and oriented to person, place, and time.  Psychiatric:        Behavior: Behavior normal.        Thought Content: Thought content normal.        Judgment: Judgment normal.           Assessment & Plan:  DM2- clinically stable, will obtain follow up A1C. Continue metformin.  Depression- stable on current meds, continue same.  HTN- stable on current meds. Continue same.   Fatigue- has follow up scheduled with sleep specialist for OSA.

## 2019-02-27 ENCOUNTER — Encounter: Payer: Self-pay | Admitting: Pulmonary Disease

## 2019-02-27 ENCOUNTER — Telehealth (INDEPENDENT_AMBULATORY_CARE_PROVIDER_SITE_OTHER): Payer: Managed Care, Other (non HMO) | Admitting: Pulmonary Disease

## 2019-02-27 DIAGNOSIS — G4733 Obstructive sleep apnea (adult) (pediatric): Secondary | ICD-10-CM

## 2019-02-27 DIAGNOSIS — G4719 Other hypersomnia: Secondary | ICD-10-CM

## 2019-02-27 DIAGNOSIS — G4710 Hypersomnia, unspecified: Secondary | ICD-10-CM

## 2019-02-27 NOTE — Telephone Encounter (Signed)
Patient had appointment yesterday

## 2019-02-27 NOTE — Progress Notes (Signed)
[facility]  Chief Complaint  Patient presents with  . Follow-up    OSA, severe fatigue during, PCP wants pressure checked for cpap    Constitutional:  There were no vitals taken for this visit.  Deferred.  Past Medical History:  OCD, NASH, Liver cancer, Migraine HA, IBS, GERD, HTN, DM, Depression, Nephrolithiasis, Bicuspid aortic valve, Allergies  Brief Summary:  Stephanie Miles is a 38 y.o. female with obstructive sleep apnea and daytime fatigue.  Virtual Visit via Video Note  I connected with Stephanie Miles on 02/27/19 at 10:00 AM EDT by a video enabled telemedicine application and verified that I am speaking with the correct person using two identifiers.  Location: Patient: home Provider: medical office   I discussed the limitations of evaluation and management by telemedicine and the availability of in person appointments. The patient expressed understanding and agreed to proceed.  Saw her in 2018.  Had home sleep study.  Mild sleep apnea.  Uses CPAP.  Still having daytime fatigue and sleepiness.  Was dx with liver cancer.  Found to have elevated ANA.  Referred to Dr. Amil Amen with rheumatology.  Falls asleep in evening after work while watching TV.  Can sleep for few hours before waking up and then going to bed.  Wears CPAP in bed, but not when she sleeps otherwise.  Gets about 5 hours sleep with CPAP per night.  She is getting a service dog, and dog is being trained to wake her up when she isn't using CPAP.    Physical Exam:   Deferred.   Assessment/Plan:   Obstructive sleep apnea with persistent daytime hypersomnia and fatigue. - encouraged her to use CPAP whenever she is asleep - discussed strategies to improve CPAP compliance - emphasized importance of regular sleep/wake schedule - continue auto CPAP - defer addition of stimulant medication for now   Patient Instructions  Try using your CPAP  whenever you are asleep  Maintain a regular sleep schedule  Follow up in 4 months   I discussed the assessment and treatment plan with the patient. The patient was provided an opportunity to ask questions and all were answered. The patient agreed with the plan and demonstrated an understanding of the instructions.   The patient was advised to call back or seek an in-person evaluation if the symptoms worsen or if the condition fails to improve as anticipated.  I provided 24 minutes of non-face-to-face time during this encounter.  Chesley Mires, MD [facility] Pager: [phone] 02/27/2019, 11:02 AM  Flow Sheet     Pulmonary tests:  CT angio 07/31/18 >> b/l GGO, air trapping  Sleep tests:  HST 01/23/17 >> AHI 9.5, SaO2 low 78%. Auto CPAP 01/28/19 to 02/26/19 >> used on 20 of 30 nights with average 5 hrs 5 min.  Average AHI 0.8 with median CPAP 10 and 95 th percentile CPAP 12 cm H2O  Cardiac tests:  Echo 11/28/17 >> EF 55 to 60%, bicuspid AV with mild MR  Medications:   Allergies as of 02/27/2019      Reactions   Dust Mite Extract Shortness Of Breath, Cough   Mold Extract [trichophyton] Shortness Of Breath, Cough   Nsaids Other (See Comments)   Patient is to NOT have NSAIDS   Codeine Nausea Only   Required use of an antiemetic   Amlodipine Swelling, Other (See Comments)   Edema   Tape Rash   Medical tape and Band-Aids aren't tolerated  Medication List       Accurate as of February 27, 2019 11:02 AM. If you have any questions, ask your nurse or doctor.        STOP taking these medications   nitroGLYCERIN 0.2 mg/hr patch Commonly known as: NITRODUR - Dosed in mg/24 hr Stopped by: Chesley Mires, MD     TAKE these medications   Accu-Chek FastClix Lancets Misc Check sugar twice daily   Accu-Chek Guide test strip Generic drug: glucose blood USE TO CHECK BLOOD SUGAR TWO TIMES A DAY   Accu-Chek Guide w/Device Kit 1 each by Does not apply route  daily.   acetaminophen 500 MG tablet Commonly known as: TYLENOL Take 500-1,000 mg by mouth every 6 (six) hours as needed (for pain).   ALPRAZolam 0.5 MG tablet Commonly known as: XANAX TAKE ONE TABLET BY MOUTH TWICE A DAY AS NEEDED   budesonide-formoterol 160-4.5 MCG/ACT inhaler Commonly known as: Symbicort INHALE TWO PUFFS BY MOUTH TWICE A DAY   buPROPion 150 MG 24 hr tablet Commonly known as: WELLBUTRIN XL Take 1 tablet (150 mg total) by mouth daily.   cetirizine 10 MG tablet Commonly known as: ZYRTEC Take 10 mg by mouth daily.   clotrimazole-betamethasone cream Commonly known as: Lotrisone Apply 1 application topically 2 (two) times daily.   dicyclomine 20 MG tablet Commonly known as: BENTYL TAKE ONE TABLET BY MOUTH FOUR TIMES A DAY BEFORE MEALS AND AT BEDTIME   DOXORUBICIN HCL IV Inject into the vein See admin instructions.   escitalopram 20 MG tablet Commonly known as: LEXAPRO TAKE ONE TABLET BY MOUTH DAILY   fluticasone 50 MCG/ACT nasal spray Commonly known as: FLONASE SPRAY TWO SPRAYS IN THE BOTH NOSTRILS DAILY AS NEEDED FOR ALLERGIES What changed: See the new instructions.   furosemide 40 MG tablet Commonly known as: LASIX TAKE ONE TABLET BY MOUTH EVERY MORNING   hydrocortisone 2.5 % cream Apply topically 2 (two) times daily.   hydrocortisone-pramoxine rectal foam Commonly known as: Proctofoam HC Place 1 applicator rectally 2 (two) times daily.   losartan 100 MG tablet Commonly known as: COZAAR Take 1 tablet (100 mg total) by mouth daily.   metFORMIN 500 MG 24 hr tablet Commonly known as: GLUCOPHAGE-XR TAKE TWO TABLETS BY MOUTH DAILY WITH BREAKFAST   metoprolol succinate 100 MG 24 hr tablet Commonly known as: TOPROL-XL Take 1 tablet (100 mg total) by mouth daily. WITH OR IMMEDIATELY FOLLOWING A MEAL   misoprostol 200 MCG tablet Commonly known as: CYTOTEC insert four tablets vaginally the night prior to your appointment   montelukast 10 MG  tablet Commonly known as: SINGULAIR TAKE ONE TABLET BY MOUTH AT BEDTIME   multivitamin with minerals tablet Take 1 tablet by mouth daily.   multivitamin-prenatal 27-0.8 MG Tabs tablet Take 1 tablet by mouth daily.   nystatin powder Commonly known as: nystatin Apply topically 2 (two) times daily.   omeprazole 20 MG capsule Commonly known as: PRILOSEC TAKE TWO CAPSULES BY MOUTH EVERY MORNING BEFORE BREAKFAST AND 1 CAPSULE EVERY EVENING BEFORE SUPPER   ondansetron 4 MG disintegrating tablet Commonly known as: Zofran ODT Take 1 tablet (4 mg total) by mouth every 8 (eight) hours as needed for nausea or vomiting.   oxyCODONE 5 MG immediate release tablet Commonly known as: Roxicodone Take 1 tablet (5 mg total) by mouth every 4 (four) hours as needed for severe pain.   potassium chloride 10 MEQ tablet Commonly known as: K-DUR Take 1 tablet (10 mEq total) by mouth every  other day. What changed:   when to take this  additional instructions   SUMAtriptan 50 MG tablet Commonly known as: Imitrex Take 1 tablet (50 mg total) by mouth every 2 (two) hours as needed for migraine. May repeat in 2 hours if headache persists or recurs.   Ventolin HFA 108 (90 Base) MCG/ACT inhaler Generic drug: albuterol Inhale 2 puffs into the lungs every 6 (six) hours as needed for wheezing or shortness of breath.   albuterol (2.5 MG/3ML) 0.083% nebulizer solution Commonly known as: PROVENTIL USE THREE MILLILITERS VIA NEBULIZATION BY MOUTH EVERY 6 HOURS AS NEEDED FOR SHORTNESS OF BREATH   Vitamin D3 50 MCG (2000 UT) Tabs Take 2,000 Units by mouth daily.       Past Surgical History:  She  has a past surgical history that includes Dental surgery; laparoscopic appendectomy (N/A, 06/06/2013); Colonoscopy; Esophagogastroduodenoscopy; and Liver biopsy.  Family History:  Her family history includes AAA (abdominal aortic aneurysm) in her paternal grandmother; Asthma in her mother and sister; Breast  cancer in an other family member; Breast cancer (age of onset: 34) in her father; Cancer in an other family member; Cervical cancer in her maternal aunt; Colon cancer (age of onset: 109) in her maternal grandmother; Colon cancer (age of onset: 36) in an other family member; Colon polyps in her maternal grandfather; Diabetes in her father; Heart disease in her father and paternal grandfather; Heart disease (age of onset: 27) in her paternal uncle; Hyperlipidemia in her brother and father; Hypertension in her brother, father, and mother; Liver cancer in her maternal grandmother; Melanoma (age of onset: 64) in her maternal grandfather; Stomach cancer (age of onset: 56) in an other family member; Stroke (age of onset: 62) in her paternal uncle; Vision loss in her brother.  Social History:  She  reports that she quit smoking about 6 years ago. Her smoking use included cigarettes. She has a 20.00 pack-year smoking history. She has never used smokeless tobacco. She reports that she does not drink alcohol or use drugs.

## 2019-02-27 NOTE — Patient Instructions (Signed)
Try using your CPAP whenever you are asleep  Maintain a regular sleep schedule  Follow up in 4 months

## 2019-02-28 ENCOUNTER — Telehealth: Payer: Self-pay | Admitting: Family

## 2019-02-28 NOTE — Telephone Encounter (Signed)
Can you please request last office note from Dr. Datha Kissinger [facility] office?

## 2019-03-04 NOTE — Telephone Encounter (Signed)
Request faxed over for ov note

## 2019-03-05 ENCOUNTER — Encounter: Payer: Self-pay | Admitting: Family

## 2019-03-05 MED ORDER — ESCITALOPRAM OXALATE 10 MG PO TABS: ORAL_TABLET | ORAL | 0 refills | Status: DC

## 2019-03-05 MED ORDER — DULOXETINE HCL 30 MG PO CPEP: ORAL_CAPSULE | ORAL | 0 refills | Status: DC

## 2019-03-06 ENCOUNTER — Emergency Department (HOSPITAL_BASED_OUTPATIENT_CLINIC_OR_DEPARTMENT_OTHER)
Admission: EM | Admit: 2019-03-06 | Discharge: 2019-03-06 | Disposition: A | Discharge status: Home / Self Care | Payer: Managed Care, Other (non HMO) | Attending: Emergency Medicine | Admitting: Emergency Medicine

## 2019-03-06 ENCOUNTER — Other Ambulatory Visit: Payer: Self-pay

## 2019-03-06 ENCOUNTER — Encounter (HOSPITAL_BASED_OUTPATIENT_CLINIC_OR_DEPARTMENT_OTHER): Payer: Self-pay | Admitting: *Deleted

## 2019-03-06 DIAGNOSIS — Z5321 Procedure and treatment not carried out due to patient leaving prior to being seen by health care provider: Secondary | ICD-10-CM | POA: Diagnosis not present

## 2019-03-06 DIAGNOSIS — R0602 Shortness of breath: Secondary | ICD-10-CM | POA: Diagnosis present

## 2019-03-06 NOTE — ED Triage Notes (Signed)
Pt c/o SOB x 1 day , resp tx pta

## 2019-03-07 ENCOUNTER — Other Ambulatory Visit: Payer: Self-pay | Admitting: Family

## 2019-03-10 ENCOUNTER — Encounter: Payer: Self-pay | Admitting: Family

## 2019-03-11 ENCOUNTER — Telehealth: Payer: Self-pay | Admitting: Family

## 2019-03-11 NOTE — Telephone Encounter (Signed)
LM for pt to call and schedule lab appt

## 2019-03-14 ENCOUNTER — Telehealth: Payer: Self-pay | Admitting: Cardiology

## 2019-03-14 ENCOUNTER — Other Ambulatory Visit: Payer: Self-pay | Admitting: Family

## 2019-03-14 NOTE — Telephone Encounter (Signed)
LVM for patient to call schedule 6 month f/u from [facility]

## 2019-03-15 ENCOUNTER — Other Ambulatory Visit: Payer: Self-pay

## 2019-03-15 ENCOUNTER — Other Ambulatory Visit (INDEPENDENT_AMBULATORY_CARE_PROVIDER_SITE_OTHER): Payer: Managed Care, Other (non HMO)

## 2019-03-15 DIAGNOSIS — E1121 Type 2 diabetes mellitus with diabetic nephropathy: Secondary | ICD-10-CM

## 2019-03-15 LAB — BASIC METABOLIC PANEL WITH GFR
BUN: 13 mg/dL (ref 6–23)
CO2: 32 meq/L (ref 19–32)
Calcium: 9.2 mg/dL (ref 8.4–10.5)
Chloride: 102 meq/L (ref 96–112)
Creatinine, Ser: 0.9 mg/dL (ref 0.40–1.20)
GFR: 69.26 mL/min (ref >60.00)
Glucose, Bld: 120 mg/dL — ABNORMAL HIGH (ref 70–99)
Potassium: 3.7 meq/L (ref 3.5–5.1)
Sodium: 140 meq/L (ref 135–145)

## 2019-03-15 LAB — HEMOGLOBIN A1C: Hgb A1c MFr Bld: 6.1 % (ref 4.6–6.5)

## 2019-03-20 ENCOUNTER — Telehealth: Payer: Self-pay | Admitting: Family

## 2019-03-20 NOTE — Telephone Encounter (Signed)
See my chart message

## 2019-04-02 ENCOUNTER — Ambulatory Visit (INDEPENDENT_AMBULATORY_CARE_PROVIDER_SITE_OTHER): Payer: Managed Care, Other (non HMO) | Admitting: Family

## 2019-04-02 ENCOUNTER — Encounter: Payer: Self-pay | Admitting: Family

## 2019-04-02 DIAGNOSIS — E119 Type 2 diabetes mellitus without complications: Secondary | ICD-10-CM | POA: Diagnosis not present

## 2019-04-02 DIAGNOSIS — M797 Fibromyalgia: Secondary | ICD-10-CM | POA: Diagnosis not present

## 2019-04-02 DIAGNOSIS — G4733 Obstructive sleep apnea (adult) (pediatric): Secondary | ICD-10-CM

## 2019-04-02 DIAGNOSIS — F329 Major depressive disorder, single episode, unspecified: Secondary | ICD-10-CM

## 2019-04-02 DIAGNOSIS — Z0289 Encounter for other administrative examinations: Secondary | ICD-10-CM

## 2019-04-02 MED ORDER — DULOXETINE HCL 60 MG PO CPEP: 60.0000 mg | ORAL_CAPSULE | Freq: Every day | ORAL | 5 refills | Status: DC

## 2019-04-02 NOTE — Progress Notes (Signed)
Virtual Visit via Video Note  I connected with Stephanie Miles on 04/02/19 at  5:40 PM EDT by a video enabled telemedicine application and verified that I am speaking with the correct person using two identifiers.  Location: Patient: home Provider: work   I discussed the limitations of evaluation and management by telemedicine and the availability of in person appointments. The patient expressed understanding and agreed to proceed.  History of Present Illness:  Patient is a 38 yr old female who presents today for followup.   Her lab work last visit revealed A1C 6.1.   Fibromyalgia-she continues Cymbalta.  Reports pain level is improved. Joints are still aching but overall fibro pain is much improved.  Notes that she had some anxiety last week and could not go to a funeral due to [place] however she is feeling better at this time   OSA- reports better energy on cpap.  Reports that she went to the ED on 8/12 following acid reflux and hot flash "at the same time."  Had some issues with swallowing/breathing. Symptoms resolved and she decide not to stay due to a long wait.  Went home and has not had any recurrent symptoms.  Past Medical History:  Diagnosis Date  . Allergy   . Anxiety   . Aortic regurgitation due to bicuspid aortic valve   . Arthritis   . Borderline diabetes 10/20/2016  . Chronic kidney disease    kidney stones  . Depression   . Diabetes type 2, controlled ([place]) 10/20/2016  . Family history of breast cancer in female   . Family history of colon cancer   . Fatty liver 11/28/2016  . GERD (gastroesophageal reflux disease)   . Heart murmur   . Hypertension   . IBS (irritable bowel syndrome)   . Insomnia   . Liver cancer ([place]) 03/2017  . Migraine headache   . NASH (nonalcoholic steatohepatitis) 04/04/2017   Per biopsy 9/18  . OCD (obsessive compulsive disorder)   . Orbital cellulitis    at age 20 was hospitalized for 3 months (almost died)   . OSA (obstructive  sleep apnea)   . S/P appy 05/2013  . Sleep apnea      Social History   Socioeconomic History  . Marital status: Single    Spouse name: Not on file  . Number of children: 0  . Years of education: Not on file  . Highest education level: Not on file  Occupational History  . Occupation: Ship broker  . Occupation: Programmer, applications  Social Needs  . Financial resource strain: Not on file  . Food insecurity    Worry: Not on file    Inability: Not on file  . Transportation needs    Medical: Not on file    Non-medical: Not on file  Tobacco Use  . Smoking status: Former Smoker    Packs/day: 1.00    Years: 20.00    Pack years: 20.00    Types: Cigarettes    Quit date: 02/22/2013    Years since quitting: 6.1  . Smokeless tobacco: Never Used  Substance and Sexual Activity  . Alcohol use: Never    Alcohol/week: 0.0 standard drinks    Frequency: Never  . Drug use: No  . Sexual activity: Not Currently  Lifestyle  . Physical activity    Days per week: Not on file    Minutes per session: Not on file  . Stress: Not on file  Relationships  . Social connections  Talks on phone: Not on file    Gets together: Not on file    Attends religious service: Not on file    Active member of club or organization: Not on file    Attends meetings of clubs or organizations: Not on file    Relationship status: Not on file  . Intimate partner violence    Fear of current or ex partner: Not on file    Emotionally abused: Not on file    Physically abused: Not on file    Forced sexual activity: Not on file  Other Topics Concern  . Not on file  Social History Narrative   Working at [employer] doing [employer] alone (has a Neurosurgeon)   Working on a degree at [employer], will plan to return fall 2018   Enjoys spending time with family.   Friends and family in [place]    Past Surgical History:  Procedure Laterality Date  . COLONOSCOPY    . DENTAL SURGERY     wisdom teeth  .  ESOPHAGOGASTRODUODENOSCOPY    . LAPAROSCOPIC APPENDECTOMY N/A 06/06/2013   Procedure: APPENDECTOMY LAPAROSCOPIC;  Surgeon: Imogene Burn. Georgette Dover, MD;  Location: [facility];  Service: General;  Laterality: N/A;  . LIVER BIOPSY      Family History  Problem Relation Age of Onset  . Asthma Mother   . Hypertension Mother   . Breast cancer Father 52  . Diabetes Father   . Hypertension Father   . Heart disease Father   . Hyperlipidemia Father   . Asthma Sister   . Hyperlipidemia Brother   . Hypertension Brother   . Vision loss Brother   . Colon cancer Maternal Grandmother 42  . Liver cancer Maternal Grandmother   . Cervical cancer Maternal Aunt   . Stroke Paternal Uncle 25  . Melanoma Maternal Grandfather 37  . Colon polyps Maternal Grandfather   . AAA (abdominal aortic aneurysm) Paternal Grandmother   . Heart disease Paternal Grandfather   . Heart disease Paternal Uncle 84  . Colon cancer Other 108       MGM's mother  . Stomach cancer Other 36       MGM's mother  . Breast cancer Other        MGM's maternal aunt  . Cancer Other        MGM's maternal uncle with GI cancer  . Esophageal cancer Neg Hx   . Pancreatic cancer Neg Hx   . Rectal cancer Neg Hx     Allergies  Allergen Reactions  . Dust Mite Extract Shortness Of Breath and Cough  . Mold Extract [Trichophyton] Shortness Of Breath and Cough  . Nsaids Other (See Comments)    Patient is to NOT have NSAIDS  . Codeine Nausea Only    Required use of an antiemetic   . Amlodipine Swelling and Other (See Comments)    Edema   . Tape Rash    Medical tape and Band-Aids aren't tolerated    Current Outpatient Medications on File Prior to Visit  Medication Sig Dispense Refill  . ACCU-CHEK FASTCLIX LANCETS MISC Check sugar twice daily 102 each 3  . ACCU-CHEK GUIDE test strip USE TO CHECK BLOOD SUGAR TWO TIMES A DAY 100 each 0  . acetaminophen (TYLENOL) 500 MG tablet Take 500-1,000 mg by mouth every 6 (six) hours as needed (for pain).      Marland Kitchen albuterol (VENTOLIN HFA) 108 (90 Base) MCG/ACT inhaler Inhale 2 puffs into the  lungs every 6 (six) hours as needed for wheezing or shortness of breath.    . ALPRAZolam (XANAX) 0.5 MG tablet TAKE ONE TABLET BY MOUTH TWICE A DAY AS NEEDED 45 tablet 0  . Blood Glucose Monitoring Suppl (ACCU-CHEK GUIDE) w/Device KIT 1 each by Does not apply route daily. 1 kit 0  . budesonide-formoterol (SYMBICORT) 160-4.5 MCG/ACT inhaler INHALE TWO PUFFS BY MOUTH TWICE A DAY 10.2 g 5  . buPROPion (WELLBUTRIN XL) 150 MG 24 hr tablet Take 1 tablet (150 mg total) by mouth daily. 90 tablet 1  . cetirizine (ZYRTEC) 10 MG tablet Take 10 mg by mouth daily.    . Cholecalciferol (VITAMIN D3) 50 MCG (2000 UT) TABS Take 2,000 Units by mouth daily.    . clotrimazole-betamethasone (LOTRISONE) cream Apply 1 application topically 2 (two) times daily. 45 g 1  . dicyclomine (BENTYL) 20 MG tablet TAKE ONE TABLET BY MOUTH FOUR TIMES A DAY BEFORE MEALS AND AT BEDTIME 120 tablet 3  . DOXORUBICIN HCL IV Inject into the vein See admin instructions.     . fluticasone (FLONASE) 50 MCG/ACT nasal spray SPRAY TWO SPRAYS IN THE BOTH NOSTRILS DAILY AS NEEDED FOR ALLERGIES (Patient taking differently: Place 2 sprays into both nostrils daily. ) 16 g 5  . furosemide (LASIX) 40 MG tablet TAKE ONE TABLET BY MOUTH EVERY MORNING 90 tablet 1  . losartan (COZAAR) 100 MG tablet Take 1 tablet (100 mg total) by mouth daily. 90 tablet 1  . metFORMIN (GLUCOPHAGE-XR) 500 MG 24 hr tablet TAKE TWO TABLETS BY MOUTH DAILY WITH BREAKFAST 180 tablet 1  . metoprolol succinate (TOPROL-XL) 100 MG 24 hr tablet Take 1 tablet (100 mg total) by mouth daily. WITH OR IMMEDIATELY FOLLOWING A MEAL 90 tablet 1  . montelukast (SINGULAIR) 10 MG tablet TAKE ONE TABLET BY MOUTH AT BEDTIME 30 tablet 3  . Multiple Vitamins-Minerals (MULTIVITAMIN WITH MINERALS) tablet Take 1 tablet by mouth daily.    Marland Kitchen nystatin (NYSTATIN) powder Apply topically 2 (two) times daily. 30 g 2  .  omeprazole (PRILOSEC) 20 MG capsule TAKE TWO CAPSULES BY MOUTH EVERY MORNING BEFORE BREAKFAST AND 1 CAPSULE EVERY EVENING BEFORE SUPPER 90 capsule 4  . potassium chloride (K-DUR) 10 MEQ tablet TAKE ONE TABLET BY MOUTH EVERY OTHER DAY 15 tablet 5  . Prenatal Vit-Fe Fumarate-FA (MULTIVITAMIN-PRENATAL) 27-0.8 MG TABS tablet Take 1 tablet by mouth daily.     . SUMAtriptan (IMITREX) 50 MG tablet Take 1 tablet (50 mg total) by mouth every 2 (two) hours as needed for migraine. May repeat in 2 hours if headache persists or recurs. 10 tablet 0   No current facility-administered medications on file prior to visit.     There were no vitals taken for this visit.    Observations/Objective:   Gen: Awake, alert, no acute distress Resp: Breathing is even and non-labored Psych: calm/pleasant demeanor Neuro: Alert and Oriented x 3, + facial symmetry, speech is clear.   Assessment and Plan:  Fibromyalgia-pain is improved on Cymbalta.  Continue same.  Depression-mood is stable on Cymbalta continue same.  Diabetes type 2- Lab Results  Component Value Date   HGBA1C 6.1 03/15/2019   HGBA1C 7.4 (H) 08/15/2017   HGBA1C 6.9 (H) 07/31/2017   Lab Results  Component Value Date   CREATININE 0.90 03/15/2019   continue to work on diabetic diet, exercise, and weight loss new.  Obstructive sleep apnea-improved energy on CPAP.  Continue same.  Follow Up Instructions:    I discussed  the assessment and treatment plan with the patient. The patient was provided an opportunity to ask questions and all were answered. The patient agreed with the plan and demonstrated an understanding of the instructions.   The patient was advised to call back or seek an in-person evaluation if the symptoms worsen or if the condition fails to improve as anticipated.  Nance Pear, NP

## 2019-04-03 NOTE — Telephone Encounter (Signed)
Pt completed virtual visit on 04/02/19 with PCP.

## 2019-04-04 DIAGNOSIS — M797 Fibromyalgia: Secondary | ICD-10-CM

## 2019-04-04 HISTORY — DX: Fibromyalgia: M79.7

## 2019-04-06 ENCOUNTER — Other Ambulatory Visit: Payer: Self-pay | Admitting: Family

## 2019-04-06 DIAGNOSIS — R0600 Dyspnea, unspecified: Secondary | ICD-10-CM

## 2019-04-06 DIAGNOSIS — R05 Cough: Secondary | ICD-10-CM

## 2019-04-06 DIAGNOSIS — R059 Cough, unspecified: Secondary | ICD-10-CM

## 2019-04-09 NOTE — Telephone Encounter (Signed)
Refill request for Alprazolam 0.91m Last RF: 02/20/2019 #45 w/ 0 RF Last OV: 04/02/2019 Next OV: Not scheduled, due 06/2019.   Please advise.

## 2019-04-15 ENCOUNTER — Telehealth (INDEPENDENT_AMBULATORY_CARE_PROVIDER_SITE_OTHER): Payer: Managed Care, Other (non HMO) | Admitting: Family

## 2019-04-15 ENCOUNTER — Encounter: Payer: Self-pay | Admitting: Family

## 2019-04-15 ENCOUNTER — Other Ambulatory Visit: Payer: Self-pay

## 2019-04-15 VITALS — BP 137/95 | HR 79 | Ht 66.0 in | Wt 253.2 lb

## 2019-04-15 DIAGNOSIS — L304 Erythema intertrigo: Secondary | ICD-10-CM | POA: Diagnosis not present

## 2019-04-15 MED ORDER — FLUCONAZOLE 150 MG PO TABS: ORAL_TABLET | ORAL | 0 refills | Status: DC

## 2019-04-15 NOTE — Progress Notes (Signed)
Virtual Visit via Video Note  I connected with Stephanie Miles on 04/15/19 at 12:00 PM EDT by a video enabled telemedicine application and verified that I am speaking with the correct person using two identifiers.  Location: Patient: home Provider: home   I discussed the limitations of evaluation and management by telemedicine and the availability of in person appointments. The patient expressed understanding and agreed to proceed.  History of Present Illness:  Patient is a 38 yr old female who presents today with chief complaint of skin rash. Rash is located beneath her lower abdominal skin fold. Notes rash is similar to the rashes that she gets beneath her breasts. Trying to keep the area dry. Initiatally tried A and D ointment without improvement. Now notes an associated odor and skin soreness. Then she started using nystatin powder without improvement. Reports that she has been trying to exercise and going on hikes with the dog.  Has tried guaze which seems to hold the moisture.   Past Medical History:  Diagnosis Date  . Allergy   . Anxiety   . Aortic regurgitation due to bicuspid aortic valve   . Arthritis   . Borderline diabetes 10/20/2016  . Chronic kidney disease    kidney stones  . Depression   . Diabetes type 2, controlled ([place]) 10/20/2016  . Family history of breast cancer in female   . Family history of colon cancer   . Fatty liver 11/28/2016  . GERD (gastroesophageal reflux disease)   . Heart murmur   . Hypertension   . IBS (irritable bowel syndrome)   . Insomnia   . Liver cancer ([place]) 03/2017  . Migraine headache   . NASH (nonalcoholic steatohepatitis) 04/04/2017   Per biopsy 9/18  . OCD (obsessive compulsive disorder)   . Orbital cellulitis    at age 66 was hospitalized for 3 months (almost died)   . OSA (obstructive sleep apnea)   . S/P appy 05/2013  . Sleep apnea      Social History   Socioeconomic History  . Marital status: Single    Spouse name: Not  on file  . Number of children: 0  . Years of education: Not on file  . Highest education level: Not on file  Occupational History  . Occupation: Ship broker  . Occupation: Programmer, applications  Social Needs  . Financial resource strain: Not on file  . Food insecurity    Worry: Not on file    Inability: Not on file  . Transportation needs    Medical: Not on file    Non-medical: Not on file  Tobacco Use  . Smoking status: Former Smoker    Packs/day: 1.00    Years: 20.00    Pack years: 20.00    Types: Cigarettes    Quit date: 02/22/2013    Years since quitting: 6.1  . Smokeless tobacco: Never Used  Substance and Sexual Activity  . Alcohol use: Never    Alcohol/week: 0.0 standard drinks    Frequency: Never  . Drug use: No  . Sexual activity: Not Currently  Lifestyle  . Physical activity    Days per week: Not on file    Minutes per session: Not on file  . Stress: Not on file  Relationships  . Social Herbalist on phone: Not on file    Gets together: Not on file    Attends religious service: Not on file    Active member of club or organization: Not on  file    Attends meetings of clubs or organizations: Not on file    Relationship status: Not on file  . Intimate partner violence    Fear of current or ex partner: Not on file    Emotionally abused: Not on file    Physically abused: Not on file    Forced sexual activity: Not on file  Other Topics Concern  . Not on file  Social History Narrative   Working at [employer] doing [employer] alone (has a Neurosurgeon)   Working on a degree at [employer], will plan to return fall 2018   Enjoys spending time with family.   Friends and family in [place]    Past Surgical History:  Procedure Laterality Date  . COLONOSCOPY    . DENTAL SURGERY     wisdom teeth  . ESOPHAGOGASTRODUODENOSCOPY    . LAPAROSCOPIC APPENDECTOMY N/A 06/06/2013   Procedure: APPENDECTOMY LAPAROSCOPIC;  Surgeon: Imogene Burn. Georgette Dover, MD;  Location: [facility];   Service: General;  Laterality: N/A;  . LIVER BIOPSY      Family History  Problem Relation Age of Onset  . Asthma Mother   . Hypertension Mother   . Breast cancer Father 59  . Diabetes Father   . Hypertension Father   . Heart disease Father   . Hyperlipidemia Father   . Asthma Sister   . Hyperlipidemia Brother   . Hypertension Brother   . Vision loss Brother   . Colon cancer Maternal Grandmother 36  . Liver cancer Maternal Grandmother   . Cervical cancer Maternal Aunt   . Stroke Paternal Uncle 73  . Melanoma Maternal Grandfather 59  . Colon polyps Maternal Grandfather   . AAA (abdominal aortic aneurysm) Paternal Grandmother   . Heart disease Paternal Grandfather   . Heart disease Paternal Uncle 69  . Colon cancer Other 65       MGM's mother  . Stomach cancer Other 50       MGM's mother  . Breast cancer Other        MGM's maternal aunt  . Cancer Other        MGM's maternal uncle with GI cancer  . Esophageal cancer Neg Hx   . Pancreatic cancer Neg Hx   . Rectal cancer Neg Hx     Allergies  Allergen Reactions  . Dust Mite Extract Shortness Of Breath and Cough  . Mold Extract [Trichophyton] Shortness Of Breath and Cough  . Nsaids Other (See Comments)    Patient is to NOT have NSAIDS  . Codeine Nausea Only    Required use of an antiemetic   . Amlodipine Swelling and Other (See Comments)    Edema   . Tape Rash    Medical tape and Band-Aids aren't tolerated    Current Outpatient Medications on File Prior to Visit  Medication Sig Dispense Refill  . ACCU-CHEK FASTCLIX LANCETS MISC Check sugar twice daily 102 each 3  . ACCU-CHEK GUIDE test strip USE TO CHECK BLOOD SUGAR TWO TIMES A DAY 100 each 0  . acetaminophen (TYLENOL) 500 MG tablet Take 500-1,000 mg by mouth every 6 (six) hours as needed (for pain).     Marland Kitchen albuterol (VENTOLIN HFA) 108 (90 Base) MCG/ACT inhaler Inhale 2 puffs into the lungs every 6 (six) hours as needed for wheezing or shortness of breath.    .  ALPRAZolam (XANAX) 0.5 MG tablet TAKE ONE TABLET BY MOUTH TWICE A DAY AS NEEDED 45 tablet  0  . Blood Glucose Monitoring Suppl (ACCU-CHEK GUIDE) w/Device KIT 1 each by Does not apply route daily. 1 kit 0  . budesonide-formoterol (SYMBICORT) 160-4.5 MCG/ACT inhaler INHALE TWO PUFFS BY MOUTH TWICE A DAY 10.2 g 5  . buPROPion (WELLBUTRIN XL) 150 MG 24 hr tablet Take 1 tablet (150 mg total) by mouth daily. 90 tablet 1  . cetirizine (ZYRTEC) 10 MG tablet Take 10 mg by mouth daily.    . Cholecalciferol (VITAMIN D3) 50 MCG (2000 UT) TABS Take 2,000 Units by mouth daily.    . clotrimazole-betamethasone (LOTRISONE) cream Apply 1 application topically 2 (two) times daily. 45 g 1  . dicyclomine (BENTYL) 20 MG tablet TAKE ONE TABLET BY MOUTH FOUR TIMES A DAY BEFORE MEALS AND AT BEDTIME 120 tablet 3  . DOXORUBICIN HCL IV Inject into the vein See admin instructions.     . DULoxetine (CYMBALTA) 60 MG capsule Take 1 capsule (60 mg total) by mouth daily. 30 capsule 5  . fluticasone (FLONASE) 50 MCG/ACT nasal spray SPRAY TWO SPRAYS IN THE BOTH NOSTRILS DAILY AS NEEDED FOR ALLERGIES (Patient taking differently: Place 2 sprays into both nostrils daily. ) 16 g 5  . furosemide (LASIX) 40 MG tablet TAKE ONE TABLET BY MOUTH EVERY MORNING 90 tablet 1  . losartan (COZAAR) 100 MG tablet Take 1 tablet (100 mg total) by mouth daily. 90 tablet 1  . metFORMIN (GLUCOPHAGE-XR) 500 MG 24 hr tablet TAKE TWO TABLETS BY MOUTH DAILY WITH BREAKFAST 180 tablet 1  . metoprolol succinate (TOPROL-XL) 100 MG 24 hr tablet Take 1 tablet (100 mg total) by mouth daily. WITH OR IMMEDIATELY FOLLOWING A MEAL 90 tablet 1  . montelukast (SINGULAIR) 10 MG tablet TAKE ONE TABLET BY MOUTH EVERY NIGHT AT BEDTIME 30 tablet 2  . Multiple Vitamins-Minerals (MULTIVITAMIN WITH MINERALS) tablet Take 1 tablet by mouth daily.    Marland Kitchen nystatin (NYSTATIN) powder Apply topically 2 (two) times daily. 30 g 2  . omeprazole (PRILOSEC) 20 MG capsule TAKE TWO CAPSULES BY  MOUTH EVERY MORNING BEFORE BREAKFAST AND 1 CAPSULE EVERY EVENING BEFORE SUPPER 90 capsule 4  . potassium chloride (K-DUR) 10 MEQ tablet TAKE ONE TABLET BY MOUTH EVERY OTHER DAY 15 tablet 5  . Prenatal Vit-Fe Fumarate-FA (MULTIVITAMIN-PRENATAL) 27-0.8 MG TABS tablet Take 1 tablet by mouth daily.     . SUMAtriptan (IMITREX) 50 MG tablet Take 1 tablet (50 mg total) by mouth every 2 (two) hours as needed for migraine. May repeat in 2 hours if headache persists or recurs. 10 tablet 0   No current facility-administered medications on file prior to visit.     BP (!) 137/95 (BP Location: Left Arm, Patient Position: Sitting)   Pulse 79   Ht 5' 6"  (1.676 m)   Wt 253 lb 3.2 oz (114.9 kg)   BMI 40.87 kg/m    Observations/Objective:   Gen: Awake, alert, no acute distress Resp: Breathing is even and non-labored Psych: calm/pleasant demeanor Neuro: Alert and Oriented x 3, + facial symmetry, speech is clear. Skin: + intertriginous rash noted under abdominal skin fold. No obvious associated cellulitis is noted.  Assessment and Plan:  Intertrigo- failing topical rx.  Will rx with oral diflucan.  Advised pt to continue to use nystatin powder bid and to call if rash worsens or if rash fails to improve.  Pt verbalizes understanding.  Follow Up Instructions:    I discussed the assessment and treatment plan with the patient. The patient was provided an opportunity to  ask questions and all were answered. The patient agreed with the plan and demonstrated an understanding of the instructions.   The patient was advised to call back or seek an in-person evaluation if the symptoms worsen or if the condition fails to improve as anticipated.  Nance Pear, NP  me

## 2019-04-17 ENCOUNTER — Ambulatory Visit: Payer: Managed Care, Other (non HMO)

## 2019-04-18 IMAGING — CT CT ANGIO CHEST
2 of 8 series · 17 of 46 positions shown · IV contrast (iopamidol)
Comparison: None.

CLINICAL DATA: Acute onset central chest pressure radiating to the
jaw. Productive cough.

EXAM:
CT ANGIOGRAPHY CHEST WITH CONTRAST
TECHNIQUE: Multidetector CT imaging of the chest was performed using the
standard protocol during bolus administration of intravenous
contrast. Multiplanar CT image reconstructions and MIPs were
obtained to evaluate the vascular anatomy.
CONTRAST:  100mL [id number] IOPAMIDOL ([id number]) INJECTION 76%

[Series 6: thins · axial · 0.77mm/px · z∈[+1182,+1378]mm · 14 of 216 slices shown]
[im 10/216  lung]
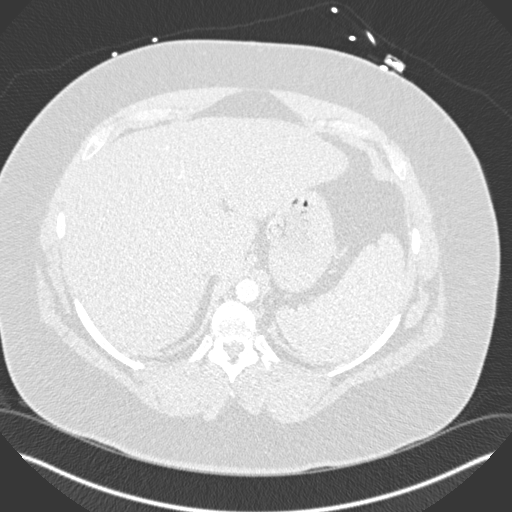
[im 30/216  soft-tissue]
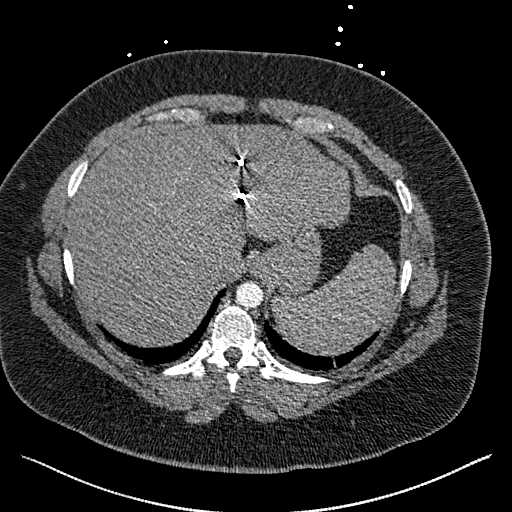
[im 40/216  lung]
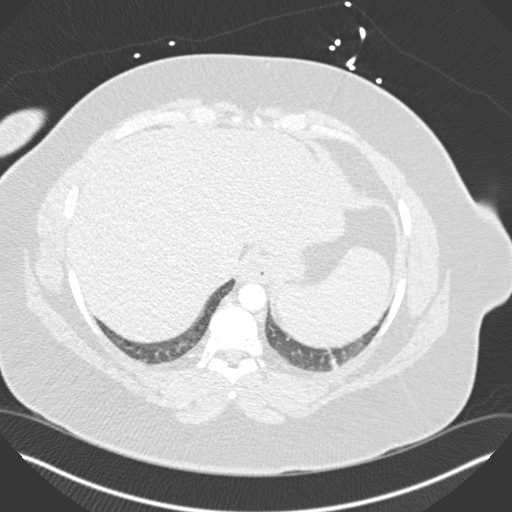
[im 59/216  soft-tissue]
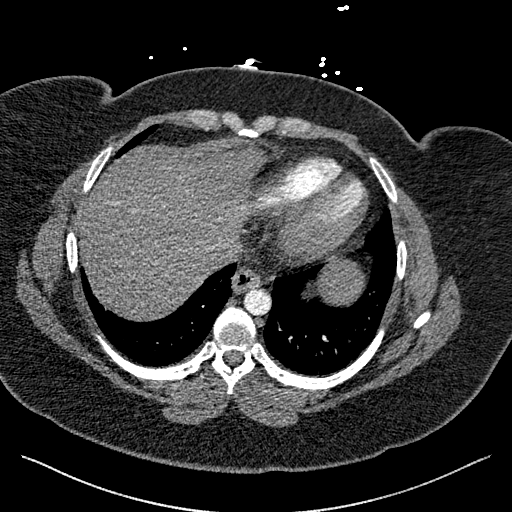
[im 69/216  lung]
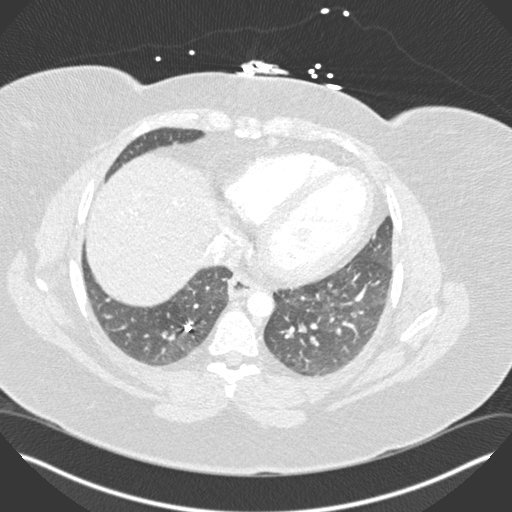
[im 88/216  soft-tissue]
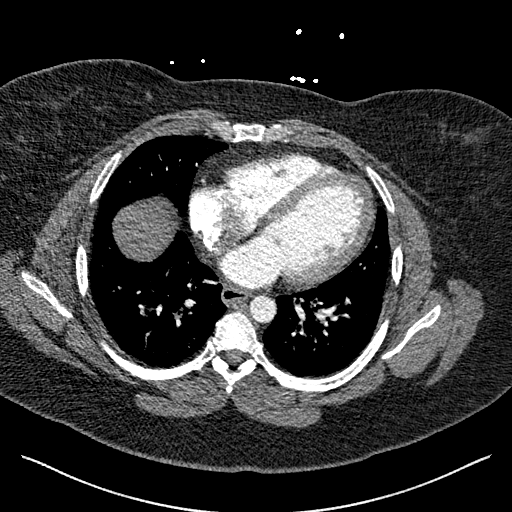
[im 98/216  lung]
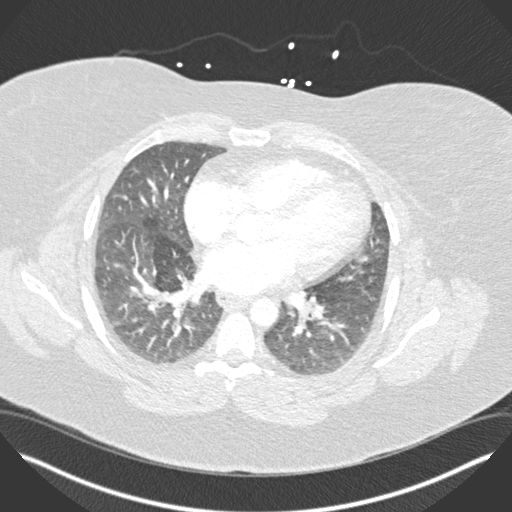
[im 118/216  soft-tissue]
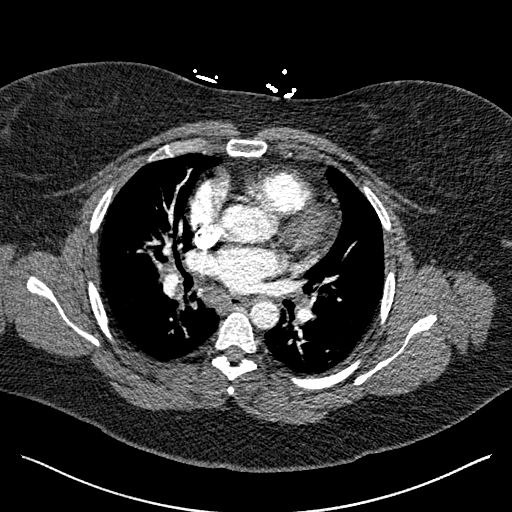
[im 128/216  lung]
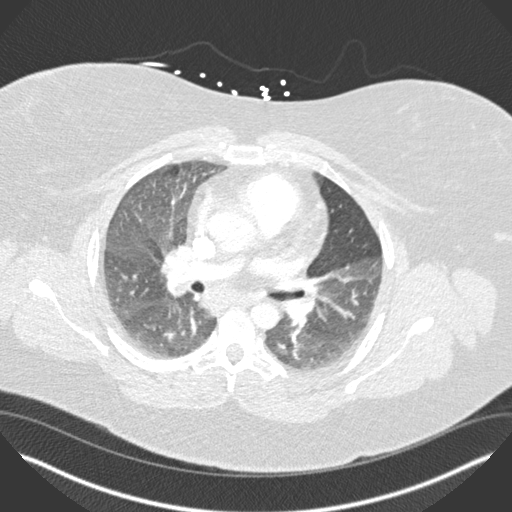
[im 147/216  soft-tissue]
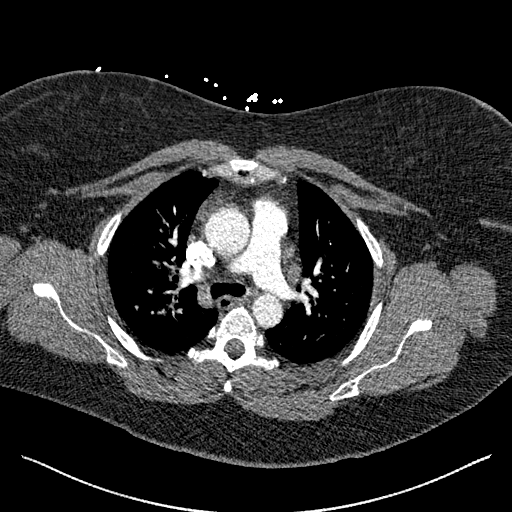
[im 157/216  lung]
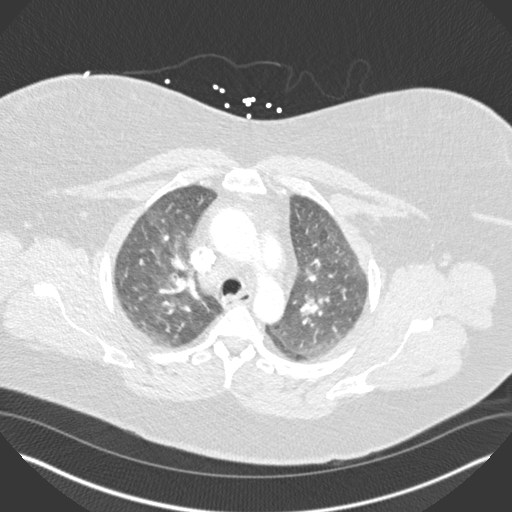
[im 176/216  soft-tissue]
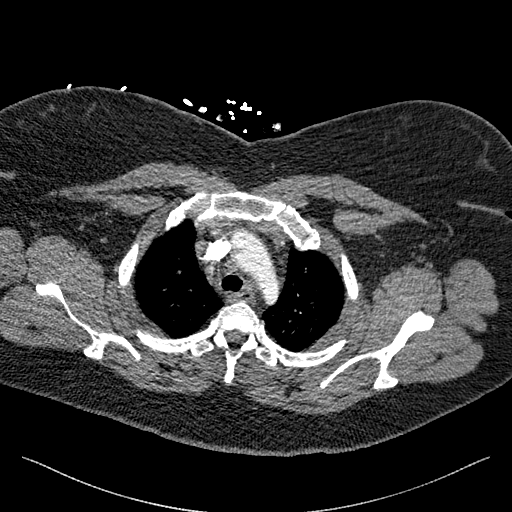
[im 186/216  lung]
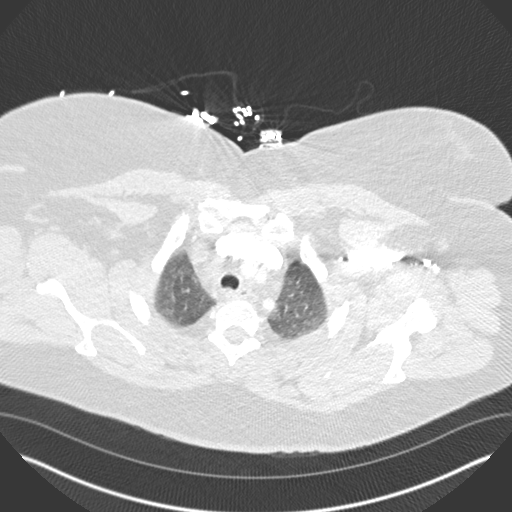
[im 206/216  soft-tissue]
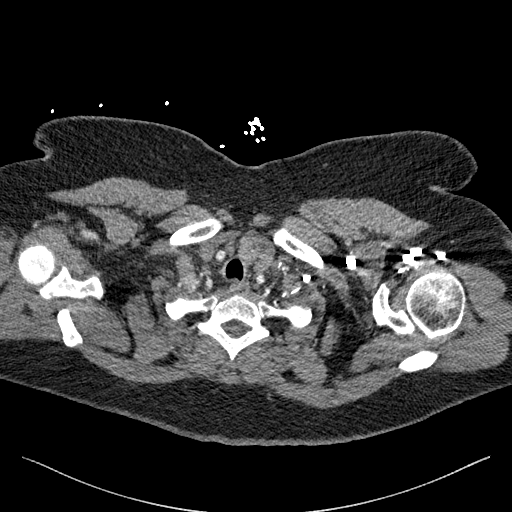

[Series 8: coronal mpr · coronal · 0.44mm/px · 3 of 139 slices shown]
[im 35/139  soft-tissue]
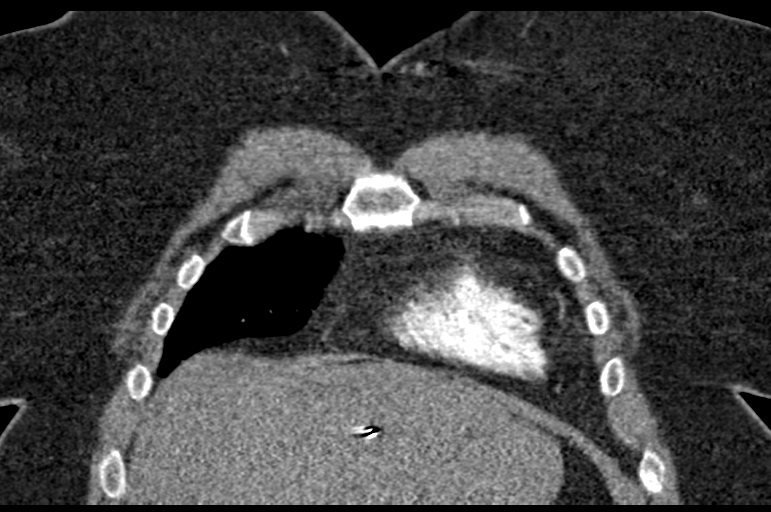
[im 70/139  soft-tissue]
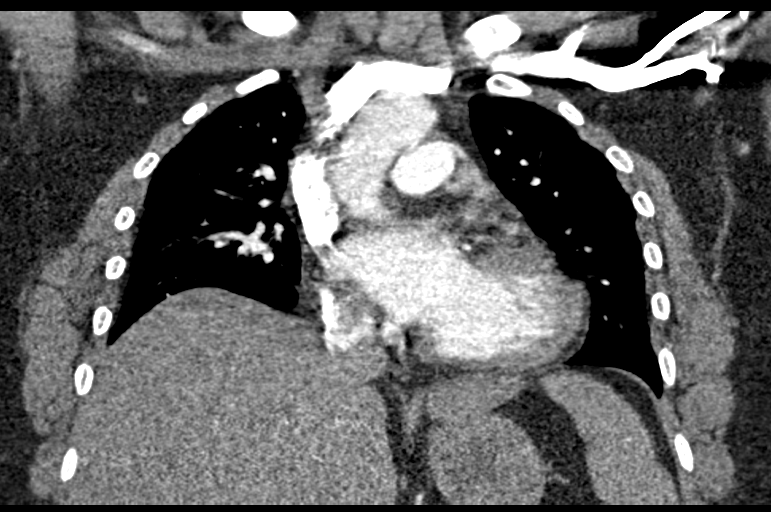
[im 104/139  soft-tissue]
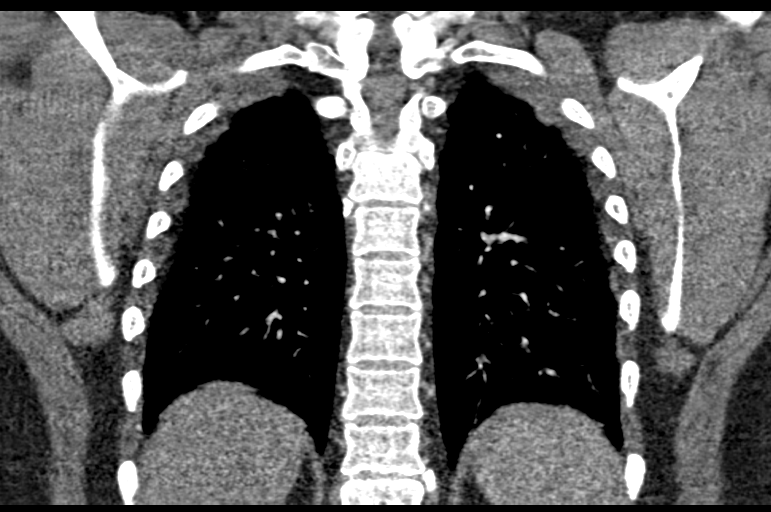

[17 of 46 positions shown; findings below may reference images not displayed]

FINDINGS: Cardiovascular: Conventional branch pattern of the great vessels.
Stable borderline cardiomegaly without pericardial effusion or
thickening. No acute pulmonary embolus to the segmental level.

Mediastinum/Nodes: Mildly enlarged mediastinal, hilar, or axillary
lymph nodes nonspecific but may be reactive in etiology. Thyroid
gland, trachea, and esophagus demonstrate no significant findings.

Lungs/Pleura: Ground-glass opacities both lungs with areas of
pulmonary hyperlucency possibly representing hypoventilatory change,
alveolitis or pneumonitis admixed with areas of air trapping
possibly from reactive airway disease. 5.4 mm ground-glass nodule in
the subpleural left upper lobe, series [DATE].

Upper Abdomen: Nonacute. Metallic densities project within the left
hepatic lobe nonspecific.

Musculoskeletal: No acute nor suspicious osseous lesions.

Review of the MIP images confirms the above findings.
IMPRESSION: 1. No acute pulmonary embolus.
2. Ground-glass opacities of both lungs with areas of pulmonary
hyperlucency possibly representing hypoventilatory change,
alveolitis or pneumonitis admixed with areas of air trapping.
3. 5.4 mm ground-glass nodule in the left upper lobe. No follow-up
recommended. This recommendation follows the consensus statement:
Guidelines for Management of Incidental Pulmonary Nodules Detected

## 2019-04-22 ENCOUNTER — Other Ambulatory Visit: Payer: Self-pay

## 2019-04-22 ENCOUNTER — Encounter: Payer: Self-pay | Admitting: Gastroenterology

## 2019-04-22 ENCOUNTER — Ambulatory Visit (INDEPENDENT_AMBULATORY_CARE_PROVIDER_SITE_OTHER): Payer: Managed Care, Other (non HMO) | Admitting: Gastroenterology

## 2019-04-22 VITALS — BP 110/80 | HR 80 | Temp 98.7°F | Ht 66.0 in | Wt 263.2 lb

## 2019-04-22 DIAGNOSIS — K7581 Nonalcoholic steatohepatitis (NASH): Secondary | ICD-10-CM

## 2019-04-22 DIAGNOSIS — K580 Irritable bowel syndrome with diarrhea: Secondary | ICD-10-CM | POA: Diagnosis not present

## 2019-04-22 DIAGNOSIS — C220 Liver cell carcinoma: Secondary | ICD-10-CM | POA: Diagnosis not present

## 2019-04-22 DIAGNOSIS — K2270 Barrett's esophagus without dysplasia: Secondary | ICD-10-CM

## 2019-04-22 NOTE — Patient Instructions (Signed)
Continue omeprazole and dicyclomine.   We have given you a low fod map diet to follow.   Thank you for choosing me and [facility].  Pricilla Riffle. Dagoberto Ligas., MD., Mar[facility] Regal

## 2019-04-22 NOTE — Progress Notes (Signed)
    History of Present Illness: This is a 38 year old female with GERD, IBS, NASH, HCC.  She relates her reflux symptoms are under good control on her current regimen.  She states she has frequent postprandial bloating and diarrhea.  She avoids multiple foods that help control her's symptoms such as onions, watermelon, pork, high fat foods.  She has no significant change in her IBS pattern.  She continues to follow at [facility] hepatology and [facility] oncology.  EGD 01/2017 - Esophageal mucosal changes suspicious for long-segment Barrett's esophagus. Biopsied. - Small hiatal hernia. - Normal duodenal bulb and second portion of the duodenum.  Colonoscopy 10/2013 1. Normal colon; multiple random biopsies performed (nomral mucosa) 2. Normal mucosa in the terminal ileum 3. Small internal hemrrhoids  Current Medications, Allergies, Past Medical History, Past Surgical History, Family History and Social History were reviewed in [employer] record.   Physical Exam: General: Well developed, well nourished, no acute distress Head: Normocephalic and atraumatic Eyes:  sclerae anicteric, EOMI Ears: Normal auditory acuity Mouth: No deformity or lesions Lungs: Clear throughout to auscultation Heart: Regular rate and rhythm; no murmurs, rubs or bruits Abdomen: Soft, non tender and non distended. No masses, hepatosplenomegaly or hernias noted. Normal Bowel sounds Rectal: Not done Musculoskeletal: Symmetrical with no gross deformities  Pulses:  Normal pulses noted Extremities: No clubbing, cyanosis, edema or deformities noted Neurological: Alert oriented x 4, grossly nonfocal Psychological:  Alert and cooperative. Normal mood and affect   Assessment and Recommendations:  1. Barrett's esophagus, short segment, without dysplasia.  Surveillance EGD recommended in 01/2020.  Follow standard antireflux measures and continue omeprazole 40 mg po qam and 20 mg qpm. REV in 1 year.   2. IBS-D.   Avoid stress or foods.  Reviewed low FODMAP diet.  Continue dicyclomine 20 mg po tid ac and Gas-X 4 (or similar) times daily as needed. REV in 1 year.   3. NASH, HCC followed by [facility] hepatology, Teena Irani, MD and [facility] oncology, Shirleen Schirmer, MD. She is also followed at [facility] center.

## 2019-05-03 ENCOUNTER — Encounter: Payer: Self-pay | Admitting: Family

## 2019-05-03 NOTE — Telephone Encounter (Signed)
Can you please contact pt to schedule a virtual visit on Monday with me?

## 2019-05-06 ENCOUNTER — Telehealth (INDEPENDENT_AMBULATORY_CARE_PROVIDER_SITE_OTHER): Payer: Managed Care, Other (non HMO) | Admitting: Family

## 2019-05-06 ENCOUNTER — Telehealth: Payer: Self-pay | Admitting: Family

## 2019-05-06 ENCOUNTER — Encounter: Payer: Self-pay | Admitting: Family

## 2019-05-06 ENCOUNTER — Other Ambulatory Visit: Payer: Self-pay

## 2019-05-06 DIAGNOSIS — R413 Other amnesia: Secondary | ICD-10-CM | POA: Diagnosis not present

## 2019-05-06 NOTE — Telephone Encounter (Signed)
Patient scheduled for flu shot and labs tomorrow and follow up with Melissa on 06-10-2019

## 2019-05-06 NOTE — Telephone Encounter (Signed)
Please contact pt to schedule nurse visit for blood work, flu shot. Also, schedule a 1 month follow up virtual on a Monday please with me.

## 2019-05-06 NOTE — Progress Notes (Signed)
Virtual Visit via Video Note  I connected with Stephanie Miles on 05/06/19 at  8:20 AM EDT by a video enabled telemedicine application and verified that I am speaking with the correct person using two identifiers.  Location: Patient: home Provider: home   I discussed the limitations of evaluation and management by telemedicine and the availability of in person appointments. The patient expressed understanding and agreed to proceed.  History of Present Illness:  Patient is a 38 yr old female who presenting today to discuss concerns about her ability to process/forgetfulness. Reports that she has been having trouble doing simple math problems.  Trouble searching for a word. Feels like her vocabulary has "gone down.  Feels like she is not processing as quickly.  Notes that she has had a HA "for the last couple of weeks" but she states that this happens when she needs new glasses.    Reports that she will walk into a room to do something and will forget why she got up to go in there. She reports that she feels like she is able to keep up at work. Feels like work is less stressful in her new role at work.  Reports that her dog has been sick which is stressful.  Reports that one of her friends has terminal cancer. Denies numbness, weakness.  She reports that she has chronic tingling in her feet which is unchanged..  She reports that her blood sugars have been running 80-150.    Past Medical History:  Diagnosis Date  . Allergy   . Anxiety   . Aortic regurgitation due to bicuspid aortic valve   . Arthritis   . Borderline diabetes 10/20/2016  . Chronic kidney disease    kidney stones  . Depression   . Diabetes type 2, controlled ([place]) 10/20/2016  . Family history of breast cancer in female   . Family history of colon cancer   . Fatty liver 11/28/2016  . GERD (gastroesophageal reflux disease)   . Heart murmur   . Hypertension   . IBS (irritable bowel syndrome)   . Insomnia   . Liver  cancer ([place]) 03/2017  . Migraine headache   . NASH (nonalcoholic steatohepatitis) 04/04/2017   Per biopsy 9/18  . OCD (obsessive compulsive disorder)   . Orbital cellulitis    at age 32 was hospitalized for 3 months (almost died)   . OSA (obstructive sleep apnea)   . S/P appy 05/2013  . Sleep apnea      Social History   Socioeconomic History  . Marital status: Single    Spouse name: Not on file  . Number of children: 0  . Years of education: Not on file  . Highest education level: Not on file  Occupational History  . Occupation: Ship broker  . Occupation: Programmer, applications  Social Needs  . Financial resource strain: Not on file  . Food insecurity    Worry: Not on file    Inability: Not on file  . Transportation needs    Medical: Not on file    Non-medical: Not on file  Tobacco Use  . Smoking status: Former Smoker    Packs/day: 1.00    Years: 20.00    Pack years: 20.00    Types: Cigarettes    Quit date: 02/22/2013    Years since quitting: 6.2  . Smokeless tobacco: Never Used  Substance and Sexual Activity  . Alcohol use: Never    Alcohol/week: 0.0 standard drinks    Frequency:  Never  . Drug use: No  . Sexual activity: Not Currently  Lifestyle  . Physical activity    Days per week: Not on file    Minutes per session: Not on file  . Stress: Not on file  Relationships  . Social Herbalist on phone: Not on file    Gets together: Not on file    Attends religious service: Not on file    Active member of club or organization: Not on file    Attends meetings of clubs or organizations: Not on file    Relationship status: Not on file  . Intimate partner violence    Fear of current or ex partner: Not on file    Emotionally abused: Not on file    Physically abused: Not on file    Forced sexual activity: Not on file  Other Topics Concern  . Not on file  Social History Narrative   Working at [employer] doing [employer] alone (has a Neurosurgeon)   Working on a  degree at [employer], will plan to return fall 2018   Enjoys spending time with family.   Friends and family in [place]    Past Surgical History:  Procedure Laterality Date  . COLONOSCOPY    . DENTAL SURGERY     wisdom teeth  . ESOPHAGOGASTRODUODENOSCOPY    . LAPAROSCOPIC APPENDECTOMY N/A 06/06/2013   Procedure: APPENDECTOMY LAPAROSCOPIC;  Surgeon: Imogene Burn. Georgette Dover, MD;  Location: [facility] OR;  Service: General;  Laterality: N/A;  . LIVER BIOPSY      Family History  Problem Relation Age of Onset  . Asthma Mother   . Hypertension Mother   . Breast cancer Father 15  . Diabetes Father   . Hypertension Father   . Heart disease Father   . Hyperlipidemia Father   . Asthma Sister   . Hyperlipidemia Brother   . Hypertension Brother   . Vision loss Brother   . Colon cancer Maternal Grandmother 5  . Liver cancer Maternal Grandmother   . Cervical cancer Maternal Aunt   . Stroke Paternal Uncle 55  . Melanoma Maternal Grandfather 52  . Colon polyps Maternal Grandfather   . AAA (abdominal aortic aneurysm) Paternal Grandmother   . Heart disease Paternal Grandfather   . Heart disease Paternal Uncle 75  . Colon cancer Other 59       MGM's mother  . Stomach cancer Other 78       MGM's mother  . Breast cancer Other        MGM's maternal aunt  . Cancer Other        MGM's maternal uncle with GI cancer  . Esophageal cancer Neg Hx   . Pancreatic cancer Neg Hx   . Rectal cancer Neg Hx     Allergies  Allergen Reactions  . Dust Mite Extract Shortness Of Breath and Cough  . Mold Extract [Trichophyton] Shortness Of Breath and Cough  . Nsaids Other (See Comments)    Patient is to NOT have NSAIDS  . Codeine Nausea Only    Required use of an antiemetic   . Amlodipine Swelling and Other (See Comments)    Edema   . Tape Rash    Medical tape and Band-Aids aren't tolerated    Current Outpatient Medications on File Prior to Visit  Medication Sig Dispense Refill  . ACCU-CHEK FASTCLIX  LANCETS MISC Check sugar twice daily 102 each 3  . ACCU-CHEK GUIDE test  strip USE TO CHECK BLOOD SUGAR TWO TIMES A DAY 100 each 0  . acetaminophen (TYLENOL) 500 MG tablet Take 500-1,000 mg by mouth every 6 (six) hours as needed (for pain).     Marland Kitchen albuterol (VENTOLIN HFA) 108 (90 Base) MCG/ACT inhaler Inhale 2 puffs into the lungs every 6 (six) hours as needed for wheezing or shortness of breath.    . ALPRAZolam (XANAX) 0.5 MG tablet TAKE ONE TABLET BY MOUTH TWICE A DAY AS NEEDED 45 tablet 0  . Blood Glucose Monitoring Suppl (ACCU-CHEK GUIDE) w/Device KIT 1 each by Does not apply route daily. 1 kit 0  . budesonide-formoterol (SYMBICORT) 160-4.5 MCG/ACT inhaler INHALE TWO PUFFS BY MOUTH TWICE A DAY 10.2 g 5  . buPROPion (WELLBUTRIN XL) 150 MG 24 hr tablet Take 1 tablet (150 mg total) by mouth daily. 90 tablet 1  . cetirizine (ZYRTEC) 10 MG tablet Take 10 mg by mouth daily.    . Cholecalciferol (VITAMIN D3) 50 MCG (2000 UT) TABS Take 2,000 Units by mouth daily.    . clotrimazole-betamethasone (LOTRISONE) cream Apply 1 application topically 2 (two) times daily. 45 g 1  . dicyclomine (BENTYL) 20 MG tablet TAKE ONE TABLET BY MOUTH FOUR TIMES A DAY BEFORE MEALS AND AT BEDTIME 120 tablet 3  . DOXORUBICIN HCL IV Inject into the vein See admin instructions.     . DULoxetine (CYMBALTA) 60 MG capsule Take 1 capsule (60 mg total) by mouth daily. 30 capsule 5  . fluconazole (DIFLUCAN) 150 MG tablet Take 1 tablet by mouth today, repeat in 1 week. 2 tablet 0  . fluticasone (FLONASE) 50 MCG/ACT nasal spray SPRAY TWO SPRAYS IN THE BOTH NOSTRILS DAILY AS NEEDED FOR ALLERGIES (Patient taking differently: Place 2 sprays into both nostrils daily. ) 16 g 5  . furosemide (LASIX) 40 MG tablet TAKE ONE TABLET BY MOUTH EVERY MORNING 90 tablet 1  . losartan (COZAAR) 100 MG tablet Take 1 tablet (100 mg total) by mouth daily. 90 tablet 1  . metFORMIN (GLUCOPHAGE-XR) 500 MG 24 hr tablet TAKE TWO TABLETS BY MOUTH DAILY WITH  BREAKFAST 180 tablet 1  . metoprolol succinate (TOPROL-XL) 100 MG 24 hr tablet Take 1 tablet (100 mg total) by mouth daily. WITH OR IMMEDIATELY FOLLOWING A MEAL 90 tablet 1  . montelukast (SINGULAIR) 10 MG tablet TAKE ONE TABLET BY MOUTH EVERY NIGHT AT BEDTIME 30 tablet 2  . Multiple Vitamins-Minerals (MULTIVITAMIN WITH MINERALS) tablet Take 1 tablet by mouth daily.    Marland Kitchen nystatin (NYSTATIN) powder Apply topically 2 (two) times daily. 30 g 2  . omeprazole (PRILOSEC) 20 MG capsule TAKE TWO CAPSULES BY MOUTH EVERY MORNING BEFORE BREAKFAST AND 1 CAPSULE EVERY EVENING BEFORE SUPPER 90 capsule 4  . potassium chloride (K-DUR) 10 MEQ tablet TAKE ONE TABLET BY MOUTH EVERY OTHER DAY 15 tablet 5  . Prenatal Vit-Fe Fumarate-FA (MULTIVITAMIN-PRENATAL) 27-0.8 MG TABS tablet Take 1 tablet by mouth daily.      No current facility-administered medications on file prior to visit.     There were no vitals taken for this visit.     Observations/Objective:   Gen: Awake, alert, no acute distress Resp: Breathing is even and non-labored Psych: calm/pleasant demeanor Neuro: Alert and Oriented x 3, + facial symmetry, speech is clear.   Assessment and Plan:  Memory loss- I suspect stress is playing a role.  However, will check some baseline lab work (CMET, ammonia, TSH, b12 and folate). Plan to follow up in 1 month, sooner  if symptoms worsen.  If work up WNL and symptoms persist at that time, will plan referral to neurology. Pt verbalizes understanding and agreeable to plan.   Follow Up Instructions:    I discussed the assessment and treatment plan with the patient. The patient was provided an opportunity to ask questions and all were answered. The patient agreed with the plan and demonstrated an understanding of the instructions.   The patient was advised to call back or seek an in-person evaluation if the symptoms worsen or if the condition fails to improve as anticipated.  Nance Pear, NP

## 2019-05-07 ENCOUNTER — Ambulatory Visit (INDEPENDENT_AMBULATORY_CARE_PROVIDER_SITE_OTHER): Payer: Managed Care, Other (non HMO)

## 2019-05-07 ENCOUNTER — Other Ambulatory Visit: Payer: Self-pay

## 2019-05-07 ENCOUNTER — Other Ambulatory Visit (INDEPENDENT_AMBULATORY_CARE_PROVIDER_SITE_OTHER): Payer: Managed Care, Other (non HMO)

## 2019-05-07 DIAGNOSIS — R413 Other amnesia: Secondary | ICD-10-CM | POA: Diagnosis not present

## 2019-05-07 DIAGNOSIS — Z23 Encounter for immunization: Secondary | ICD-10-CM

## 2019-05-07 LAB — COMPREHENSIVE METABOLIC PANEL
ALT: 35 U/L (ref 0–35)
AST: 24 U/L (ref 0–37)
Albumin: 4.1 g/dL (ref 3.5–5.2)
Alkaline Phosphatase: 102 U/L (ref 39–117)
BUN: 11 mg/dL (ref 6–23)
CO2: 28 mEq/L (ref 19–32)
Calcium: 9.5 mg/dL (ref 8.4–10.5)
Chloride: 101 mEq/L (ref 96–112)
Creatinine, Ser: 0.83 mg/dL (ref 0.40–1.20)
GFR: 75.99 mL/min (ref >60.00)
Glucose, Bld: 116 mg/dL — ABNORMAL HIGH (ref 70–99)
Potassium: 4 mEq/L (ref 3.5–5.1)
Sodium: 136 mEq/L (ref 135–145)
Total Bilirubin: 0.4 mg/dL (ref 0.2–1.2)
Total Protein: 6.7 g/dL (ref 6.0–8.3)

## 2019-05-07 LAB — TSH: TSH: 1.12 u[IU]/mL (ref 0.35–4.50)

## 2019-05-07 LAB — B12 AND FOLATE PANEL
Folate: 20.6 ng/mL (ref >5.9)
Vitamin B-12: 945 pg/mL — ABNORMAL HIGH (ref 211–911)

## 2019-05-07 LAB — AMMONIA: Ammonia: 54 umol/L — ABNORMAL HIGH (ref 11–35)

## 2019-05-08 ENCOUNTER — Encounter: Payer: Self-pay | Admitting: Family

## 2019-05-08 LAB — RPR: RPR Ser Ql: NONREACTIVE

## 2019-05-09 ENCOUNTER — Other Ambulatory Visit: Payer: Self-pay | Admitting: Family

## 2019-05-09 MED ORDER — LACTULOSE 20 GM/30ML PO SOLN: ORAL | 5 refills | Status: DC

## 2019-05-09 NOTE — Telephone Encounter (Signed)
Last alprazolam RX:  04/09/19, #45 Last OV: 04/15/19 Next OV: 06/10/19 UDS: 04/27/18, Past due CSC: 04/27/18, Past due

## 2019-06-10 ENCOUNTER — Other Ambulatory Visit: Payer: Self-pay

## 2019-06-10 ENCOUNTER — Encounter: Payer: Self-pay | Admitting: Neurology

## 2019-06-10 ENCOUNTER — Ambulatory Visit (INDEPENDENT_AMBULATORY_CARE_PROVIDER_SITE_OTHER): Payer: Managed Care, Other (non HMO) | Admitting: Family

## 2019-06-10 DIAGNOSIS — R7989 Other specified abnormal findings of blood chemistry: Secondary | ICD-10-CM

## 2019-06-10 DIAGNOSIS — F329 Major depressive disorder, single episode, unspecified: Secondary | ICD-10-CM | POA: Diagnosis not present

## 2019-06-10 DIAGNOSIS — G4733 Obstructive sleep apnea (adult) (pediatric): Secondary | ICD-10-CM

## 2019-06-10 DIAGNOSIS — M797 Fibromyalgia: Secondary | ICD-10-CM

## 2019-06-10 DIAGNOSIS — F32A Depression, unspecified: Secondary | ICD-10-CM

## 2019-06-10 DIAGNOSIS — E119 Type 2 diabetes mellitus without complications: Secondary | ICD-10-CM

## 2019-06-10 DIAGNOSIS — G43909 Migraine, unspecified, not intractable, without status migrainosus: Secondary | ICD-10-CM

## 2019-06-10 NOTE — Progress Notes (Signed)
Virtual Visit via Video Note  I connected with [name] on 06/10/19 at  8:20 AM EST by a video enabled telemedicine application and verified that I am speaking with the correct person using two identifiers.  Location: Patient: home Provider: office   I discussed the limitations of evaluation and management by telemedicine and the availability of in person appointments. The patient expressed understanding and agreed to proceed.  History of Present Illness:   Patient is a 38 yr old female who presents today for follow up.  DM2- reports that she is doing "ok" with exercise.  Walks her dog. Diet has been "not all that great." reports that she is meeting one meal a day.  Not checking sugars regularly. Reports all sugars have been <130 though when she does check.   Lab Results  Component Value Date   HGBA1C 6.1 03/15/2019   HGBA1C 7.4 (H) 08/15/2017   HGBA1C 6.9 (H) 07/31/2017   Lab Results  Component Value Date   CREATININE 0.83 05/07/2019   Headache-report headaches on and off.  Overall seem to be worsening. Reports that if severe symptoms she takes 2 tabs of aleve, 1 tylenol.  She reports that HA's are either in the front of on the left side of her head.  She reports associated photophobia/phonophobia. Reports that recent sxs are not as severe as her typical migraines.   Depression-  Reports mood has been "pretty good."   Elevated ammonia level-  Reports that since she has been taking lactulose she is feeling less foggy- thinking more clearly.   Fibromyalgia- maintained on cymbalta. Reports that she got a new mattress which has helped some.  OSA- maintained on cpap.   Past Medical History:  Diagnosis Date  . Allergy   . Anxiety   . Aortic regurgitation due to bicuspid aortic valve   . Arthritis   . Borderline diabetes 10/20/2016  . Chronic kidney disease    kidney stones  . Depression   . Diabetes type 2, controlled ([place]) 10/20/2016  . Family history of breast  cancer in female   . Family history of colon cancer   . Fatty liver 11/28/2016  . GERD (gastroesophageal reflux disease)   . Heart murmur   . Hypertension   . IBS (irritable bowel syndrome)   . Insomnia   . Liver cancer ([place]) 03/2017  . Migraine headache   . NASH (nonalcoholic steatohepatitis) 04/04/2017   Per biopsy 9/18  . OCD (obsessive compulsive disorder)   . Orbital cellulitis    at age 41 was hospitalized for 3 months (almost died)   . OSA (obstructive sleep apnea)   . S/P appy 05/2013  . Sleep apnea      Social History   Socioeconomic History  . Marital status: Single    Spouse name: Not on file  . Number of children: 0  . Years of education: Not on file  . Highest education level: Not on file  Occupational History  . Occupation: Ship broker  . Occupation: Programmer, applications  Social Needs  . Financial resource strain: Not on file  . Food insecurity    Worry: Not on file    Inability: Not on file  . Transportation needs    Medical: Not on file    Non-medical: Not on file  Tobacco Use  . Smoking status: Former Smoker    Packs/day: 1.00    Years: 20.00    Pack years: 20.00    Types: Cigarettes    Quit  date: 02/22/2013    Years since quitting: 6.2  . Smokeless tobacco: Never Used  Substance and Sexual Activity  . Alcohol use: Never    Alcohol/week: 0.0 standard drinks    Frequency: Never  . Drug use: No  . Sexual activity: Not Currently  Lifestyle  . Physical activity    Days per week: Not on file    Minutes per session: Not on file  . Stress: Not on file  Relationships  . Social Herbalist on phone: Not on file    Gets together: Not on file    Attends religious service: Not on file    Active member of club or organization: Not on file    Attends meetings of clubs or organizations: Not on file    Relationship status: Not on file  . Intimate partner violence    Fear of current or ex partner: Not on file    Emotionally abused: Not on file    Physically  abused: Not on file    Forced sexual activity: Not on file  Other Topics Concern  . Not on file  Social History Narrative   Working at [employer] doing [employer] alone (has a Neurosurgeon)   Working on a degree at [employer], will plan to return fall 2018   Enjoys spending time with family.   Friends and family in [place]    Past Surgical History:  Procedure Laterality Date  . COLONOSCOPY    . DENTAL SURGERY     wisdom teeth  . ESOPHAGOGASTRODUODENOSCOPY    . LAPAROSCOPIC APPENDECTOMY N/A 06/06/2013   Procedure: APPENDECTOMY LAPAROSCOPIC;  Surgeon: Imogene Burn. Georgette Dover, MD;  Location: [facility] OR;  Service: General;  Laterality: N/A;  . LIVER BIOPSY      Family History  Problem Relation Age of Onset  . Asthma Mother   . Hypertension Mother   . Breast cancer Father 31  . Diabetes Father   . Hypertension Father   . Heart disease Father   . Hyperlipidemia Father   . Asthma Sister   . Hyperlipidemia Brother   . Hypertension Brother   . Vision loss Brother   . Colon cancer Maternal Grandmother 27  . Liver cancer Maternal Grandmother   . Cervical cancer Maternal Aunt   . Stroke Paternal Uncle 47  . Melanoma Maternal Grandfather 54  . Colon polyps Maternal Grandfather   . AAA (abdominal aortic aneurysm) Paternal Grandmother   . Heart disease Paternal Grandfather   . Heart disease Paternal Uncle 31  . Colon cancer Other 67       MGM's mother  . Stomach cancer Other 87       MGM's mother  . Breast cancer Other        MGM's maternal aunt  . Cancer Other        MGM's maternal uncle with GI cancer  . Esophageal cancer Neg Hx   . Pancreatic cancer Neg Hx   . Rectal cancer Neg Hx     Allergies  Allergen Reactions  . Dust Mite Extract Shortness Of Breath and Cough  . Mold Extract [Trichophyton] Shortness Of Breath and Cough  . Nsaids Other (See Comments)    Patient is to NOT have NSAIDS  . Codeine Nausea Only    Required use of an antiemetic   . Amlodipine Swelling and  Other (See Comments)    Edema   . Tape Rash    Medical tape and Band-Aids  aren't tolerated    Current Outpatient Medications on File Prior to Visit  Medication Sig Dispense Refill  . ACCU-CHEK FASTCLIX LANCETS MISC Check sugar twice daily 102 each 3  . ACCU-CHEK GUIDE test strip USE TO CHECK BLOOD SUGAR TWO TIMES A DAY 100 each 0  . acetaminophen (TYLENOL) 500 MG tablet Take 500-1,000 mg by mouth every 6 (six) hours as needed (for pain).     Marland Kitchen albuterol (VENTOLIN HFA) 108 (90 Base) MCG/ACT inhaler Inhale 2 puffs into the lungs every 6 (six) hours as needed for wheezing or shortness of breath.    . ALPRAZolam (XANAX) 0.5 MG tablet TAKE ONE TABLET BY MOUTH TWICE A DAY AS NEEDED 45 tablet 0  . Blood Glucose Monitoring Suppl (ACCU-CHEK GUIDE) w/Device KIT 1 each by Does not apply route daily. 1 kit 0  . budesonide-formoterol (SYMBICORT) 160-4.5 MCG/ACT inhaler INHALE TWO PUFFS BY MOUTH TWICE A DAY 10.2 g 5  . buPROPion (WELLBUTRIN XL) 150 MG 24 hr tablet Take 1 tablet (150 mg total) by mouth daily. 90 tablet 1  . cetirizine (ZYRTEC) 10 MG tablet Take 10 mg by mouth daily.    . Cholecalciferol (VITAMIN D3) 50 MCG (2000 UT) TABS Take 2,000 Units by mouth daily.    . clotrimazole-betamethasone (LOTRISONE) cream Apply 1 application topically 2 (two) times daily. 45 g 1  . dicyclomine (BENTYL) 20 MG tablet TAKE ONE TABLET BY MOUTH FOUR TIMES A DAY BEFORE MEALS AND AT BEDTIME 120 tablet 3  . DOXORUBICIN HCL IV Inject into the vein See admin instructions.     . DULoxetine (CYMBALTA) 60 MG capsule Take 1 capsule (60 mg total) by mouth daily. 30 capsule 5  . fluconazole (DIFLUCAN) 150 MG tablet Take 1 tablet by mouth today, repeat in 1 week. 2 tablet 0  . fluticasone (FLONASE) 50 MCG/ACT nasal spray SPRAY TWO SPRAYS IN EACH NOSTRIL ONCE DAILY 16 g 3  . furosemide (LASIX) 40 MG tablet TAKE ONE TABLET BY MOUTH EVERY MORNING 90 tablet 1  . Lactulose 20 GM/30ML SOLN 15 ML by mouth twice daily.  May titrate  as needed to 59m three times daily with goal of 2-3 soft BM's/day 1892 mL 5  . losartan (COZAAR) 100 MG tablet Take 1 tablet (100 mg total) by mouth daily. 90 tablet 1  . metFORMIN (GLUCOPHAGE-XR) 500 MG 24 hr tablet TAKE TWO TABLETS BY MOUTH DAILY WITH BREAKFAST 180 tablet 1  . metoprolol succinate (TOPROL-XL) 100 MG 24 hr tablet Take 1 tablet (100 mg total) by mouth daily. WITH OR IMMEDIATELY FOLLOWING A MEAL 90 tablet 1  . montelukast (SINGULAIR) 10 MG tablet TAKE ONE TABLET BY MOUTH EVERY NIGHT AT BEDTIME 30 tablet 2  . Multiple Vitamins-Minerals (MULTIVITAMIN WITH MINERALS) tablet Take 1 tablet by mouth daily.    .Marland Kitchennystatin (NYSTATIN) powder Apply topically 2 (two) times daily. 30 g 2  . omeprazole (PRILOSEC) 20 MG capsule TAKE TWO CAPSULES BY MOUTH EVERY MORNING BEFORE BREAKFAST AND 1 CAPSULE EVERY EVENING BEFORE SUPPER 90 capsule 4  . potassium chloride (K-DUR) 10 MEQ tablet TAKE ONE TABLET BY MOUTH EVERY OTHER DAY 15 tablet 5  . Prenatal Vit-Fe Fumarate-FA (MULTIVITAMIN-PRENATAL) 27-0.8 MG TABS tablet Take 1 tablet by mouth daily.      No current facility-administered medications on file prior to visit.     There were no vitals taken for this visit.   Observations/Objective:   Gen: Awake, alert, no acute distress Resp: Breathing is even and non-labored Psych: calm/pleasant  demeanor Neuro: Alert and Oriented x 3, + facial symmetry, speech is clear.   Assessment and Plan:  Migraines- uncontrolled. Will refer to neurology for consult.  Fibromyalgia- stable/improved on cymbalta. Continue same.  Depression- stable on wellbutrin/cymbalta  DM2- clinically stable. Will have pt return to the lab for a1c.  Elevated ammonia level- responding well to lactulose, continue same.  OSA- good cpap compliance. Continue cpap.      Follow Up Instructions:    I discussed the assessment and treatment plan with the patient. The patient was provided an opportunity to ask questions and  all were answered. The patient agreed with the plan and demonstrated an understanding of the instructions.   The patient was advised to call back or seek an in-person evaluation if the symptoms worsen or if the condition fails to improve as anticipated.  Nance Pear, NP

## 2019-06-17 ENCOUNTER — Encounter: Payer: Self-pay | Admitting: Family

## 2019-06-17 ENCOUNTER — Other Ambulatory Visit: Payer: Self-pay | Admitting: Family

## 2019-06-18 MED ORDER — ONDANSETRON HCL 4 MG PO TABS: 4.0000 mg | ORAL_TABLET | Freq: Three times a day (TID) | ORAL | 0 refills | Status: DC | PRN

## 2019-06-22 ENCOUNTER — Encounter: Payer: Self-pay | Admitting: Family

## 2019-06-24 NOTE — Telephone Encounter (Signed)
Rod Holler, can you please add her pneumonia vaccines to the state registry?

## 2019-06-25 ENCOUNTER — Encounter: Payer: Self-pay | Admitting: Family

## 2019-06-25 ENCOUNTER — Other Ambulatory Visit (INDEPENDENT_AMBULATORY_CARE_PROVIDER_SITE_OTHER): Payer: Managed Care, Other (non HMO)

## 2019-06-25 ENCOUNTER — Other Ambulatory Visit: Payer: Self-pay

## 2019-06-25 DIAGNOSIS — E119 Type 2 diabetes mellitus without complications: Secondary | ICD-10-CM | POA: Diagnosis not present

## 2019-06-26 LAB — BASIC METABOLIC PANEL
BUN: 14 mg/dL (ref 6–23)
CO2: 27 mEq/L (ref 19–32)
Calcium: 9 mg/dL (ref 8.4–10.5)
Chloride: 102 mEq/L (ref 96–112)
Creatinine, Ser: 0.93 mg/dL (ref 0.40–1.20)
GFR: 66.59 mL/min (ref >60.00)
Glucose, Bld: 116 mg/dL — ABNORMAL HIGH (ref 70–99)
Potassium: 3.8 mEq/L (ref 3.5–5.1)
Sodium: 138 mEq/L (ref 135–145)

## 2019-06-26 LAB — HEMOGLOBIN A1C: Hgb A1c MFr Bld: 6 % (ref 4.6–6.5)

## 2019-07-02 LAB — HM DIABETES EYE EXAM

## 2019-07-16 ENCOUNTER — Encounter: Payer: Self-pay | Admitting: Neurology

## 2019-07-22 ENCOUNTER — Other Ambulatory Visit: Payer: Self-pay | Admitting: Family

## 2019-07-22 DIAGNOSIS — R05 Cough: Secondary | ICD-10-CM

## 2019-07-22 DIAGNOSIS — R0600 Dyspnea, unspecified: Secondary | ICD-10-CM

## 2019-07-22 DIAGNOSIS — R059 Cough, unspecified: Secondary | ICD-10-CM

## 2019-07-22 NOTE — Telephone Encounter (Signed)
Plz make sure she has a f/u appt with Melissa, at least a 4 mo f/u from her most recent appt in Nov. Ty.

## 2019-07-30 NOTE — Telephone Encounter (Signed)
LVM for patient to call back to schedule a 4 month office visit

## 2019-08-01 NOTE — Progress Notes (Deleted)
Virtual Visit via Video Note The purpose of this virtual visit is to provide medical care while limiting exposure to the novel coronavirus.    Consent was obtained for video visit:  Yes.   Answered questions that patient had about telehealth interaction:  Yes.   I discussed the limitations, risks, security and privacy concerns of performing an evaluation and management service by telemedicine. I also discussed with the patient that there may be a patient responsible charge related to this service. The patient expressed understanding and agreed to proceed.  Pt location: Home Physician Location: office Name of referring provider:  Debbrah Alar, NP I connected with Stephanie Miles at patients initiation/request on 08/02/2019 at  3:10 PM EST by video enabled telemedicine application and verified that I am speaking with the correct person using two identifiers. Pt MRN:  [id number] Pt DOB:  06-06-1981 Video Participants:  Stephanie Miles   History of Present Illness:  Stephanie Miles is a 39 year old female with chronic kidney disease/kidney stones, type 2 diabetes mellitus, hypertension, NASH, fibromyalgia, depression, anxiety, OCD, OSA and aortic regurgitation who presents for headaches.  History supplemented by referring provider note.  Onset:  *** Location:  *** Quality:  *** Intensity:  ***.  *** denies new headache, thunderclap headache or severe headache that wakes *** from sleep. Aura:  *** Premonitory Phase:  *** Postdrome:  *** Associated symptoms:  ***.  *** denies associated unilateral numbness or weakness. Duration:  *** Frequency:  *** Frequency of abortive medication: *** Triggers:  *** Exacerbating factors:  *** Relieving factors:  *** Activity:  ***  Current NSAIDS:  none Current analgesics:  acetaminophen Current triptans:  none Current ergotamine:  none Current anti-emetic:  Zofran 41m Current muscle relaxants:  *** Current  anti-anxiolytic:  alprazolam Current sleep aide:  *** Current Antihypertensive medications:  Toprol-XL 1073mdaily; losartan; furosemide 4088maily Current Antidepressant medications:  Duloxetine 63m18mily; bupropion Current Anticonvulsant medications:  *** Current anti-CGRP:  *** Current Vitamins/Herbal/Supplements:  MVI Current Antihistamines/Decongestants:  Zyrtec; Flonase Other therapy:  *** Hormone/birth control:  *** Other medications:  ***  Past NSAIDS:  Ibuprofen; meloxicam; naproxen Past analgesics:  Excedrin Migraine Past abortive triptans:  Sumatriptan 50mg20mt abortive ergotamine:  *** Past muscle relaxants:  *** Past anti-emetic:  *** Past antihypertensive medications:  Amlodipine; lisinopril Past antidepressant medications:  Venlafaxine XR 150mg 104my; scitalopram; mirtazapine; trazodone Past anticonvulsant medications:  *** Past anti-CGRP:  *** Past vitamins/Herbal/Supplements:  magnesium Past antihistamines/decongestants:  *** Other past therapies:  ***  Caffeine:  *** Alcohol:  no Smoker:  no Diet:  Poor.  One meal a day. Exercise:  Walks her dog daily Depression:  yes; Anxiety:  yes Other pain:  fibromyalgia Sleep hygiene:  Has OSA.  Uses CPAP Family history of headache:  ***  Labs: 06/25/2019:  BMP with Na 138, K 3.8, Cl 102, CO2 27, glucose 116, BUN 14, Cr 0.93, GFR 66.59 05/07/2019:  CMP with Na 136, K 4, Cl 101, CO2 28, glucose 116, BUN 11, Cr 0.83, GFR 75.99, t bili 0.4, ALP 102, AST 24, ALT 35; B12 945, folate 20.6; RPR nonreactive; TSH 1.12; ammonia 54.  Past Medical History: Past Medical History:  Diagnosis Date  . Allergy   . Anxiety   . Aortic regurgitation due to bicuspid aortic valve   . Arthritis   . Borderline diabetes 10/20/2016  . Chronic kidney disease    kidney stones  . Depression   . Diabetes type 2, controlled (  [place]) 10/20/2016  . Family history of breast cancer in female   . Family history of colon cancer   . Fatty liver  11/28/2016  . GERD (gastroesophageal reflux disease)   . Heart murmur   . Hypertension   . IBS (irritable bowel syndrome)   . Insomnia   . Liver cancer ([place]) 03/2017  . Migraine headache   . NASH (nonalcoholic steatohepatitis) 04/04/2017   Per biopsy 9/18  . OCD (obsessive compulsive disorder)   . Orbital cellulitis    at age 70 was hospitalized for 3 months (almost died)   . OSA (obstructive sleep apnea)   . S/P appy 05/2013  . Sleep apnea     Medications: Outpatient Encounter Medications as of 08/02/2019  Medication Sig Note  . ACCU-CHEK FASTCLIX LANCETS MISC Check sugar twice daily   . ACCU-CHEK GUIDE test strip USE TO CHECK BLOOD SUGAR TWO TIMES A DAY   . acetaminophen (TYLENOL) 500 MG tablet Take 500-1,000 mg by mouth every 6 (six) hours as needed (for pain).    Marland Kitchen albuterol (VENTOLIN HFA) 108 (90 Base) MCG/ACT inhaler Inhale 2 puffs into the lungs every 6 (six) hours as needed for wheezing or shortness of breath.   . ALPRAZolam (XANAX) 0.5 MG tablet TAKE ONE TABLET BY MOUTH TWICE A DAY AS NEEDED   . Blood Glucose Monitoring Suppl (ACCU-CHEK GUIDE) w/Device KIT 1 each by Does not apply route daily.   . budesonide-formoterol (SYMBICORT) 160-4.5 MCG/ACT inhaler INHALE TWO PUFFS BY MOUTH TWICE A DAY   . buPROPion (WELLBUTRIN XL) 150 MG 24 hr tablet TAKE ONE TABLET BY MOUTH DAILY   . cetirizine (ZYRTEC) 10 MG tablet Take 10 mg by mouth daily.   . Cholecalciferol (VITAMIN D3) 50 MCG (2000 UT) TABS Take 2,000 Units by mouth daily.   . clotrimazole-betamethasone (LOTRISONE) cream Apply 1 application topically 2 (two) times daily.   Marland Kitchen dicyclomine (BENTYL) 20 MG tablet TAKE ONE TABLET BY MOUTH FOUR TIMES A DAY BEFORE MEALS AND AT BEDTIME   . DOXORUBICIN HCL IV Inject into the vein See admin instructions.  07/30/2018: Patient stated this is to remain on her med list  . DULoxetine (CYMBALTA) 60 MG capsule Take 1 capsule (60 mg total) by mouth daily.   . fluconazole (DIFLUCAN) 150 MG tablet Take 1  tablet by mouth today, repeat in 1 week.   . fluticasone (FLONASE) 50 MCG/ACT nasal spray SPRAY TWO SPRAYS IN EACH NOSTRIL ONCE DAILY   . furosemide (LASIX) 40 MG tablet TAKE ONE TABLET BY MOUTH EVERY MORNING   . Lactulose 20 GM/30ML SOLN 15 ML by mouth twice daily.  May titrate as needed to 65m three times daily with goal of 2-3 soft BM's/day   . losartan (COZAAR) 100 MG tablet Take 1 tablet (100 mg total) by mouth daily.   . metFORMIN (GLUCOPHAGE-XR) 500 MG 24 hr tablet TAKE TWO TABLETS BY MOUTH DAILY WITH BREAKFAST   . metoprolol succinate (TOPROL-XL) 100 MG 24 hr tablet Take 1 tablet (100 mg total) by mouth daily. WITH OR IMMEDIATELY FOLLOWING A MEAL   . montelukast (SINGULAIR) 10 MG tablet TAKE ONE TABLET BY MOUTH EVERY NIGHT AT BEDTIME   . Multiple Vitamins-Minerals (MULTIVITAMIN WITH MINERALS) tablet Take 1 tablet by mouth daily. 07/30/2018: Takes a PNV, also  . nystatin (NYSTATIN) powder Apply topically 2 (two) times daily.   .Marland Kitchenomeprazole (PRILOSEC) 20 MG capsule TAKE TWO CAPSULES BY MOUTH EVERY MORNING BEFORE BREAKFAST AND 1 CAPSULE EVERY EVENING BEFORE SUPPER   .  ondansetron (ZOFRAN) 4 MG tablet Take 1 tablet (4 mg total) by mouth every 8 (eight) hours as needed for nausea or vomiting.   . potassium chloride (K-DUR) 10 MEQ tablet TAKE ONE TABLET BY MOUTH EVERY OTHER DAY   . Prenatal Vit-Fe Fumarate-FA (MULTIVITAMIN-PRENATAL) 27-0.8 MG TABS tablet Take 1 tablet by mouth daily.     No facility-administered encounter medications on file as of 08/02/2019.    Allergies: Allergies  Allergen Reactions  . Dust Mite Extract Shortness Of Breath and Cough  . Mold Extract [Trichophyton] Shortness Of Breath and Cough  . Nsaids Other (See Comments)    Patient is to NOT have NSAIDS  . Codeine Nausea Only    Required use of an antiemetic   . Amlodipine Swelling and Other (See Comments)    Edema   . Tape Rash    Medical tape and Band-Aids aren't tolerated    Family History: Family History    Problem Relation Age of Onset  . Asthma Mother   . Hypertension Mother   . Breast cancer Father 20  . Diabetes Father   . Hypertension Father   . Heart disease Father   . Hyperlipidemia Father   . Asthma Sister   . Hyperlipidemia Brother   . Hypertension Brother   . Vision loss Brother   . Colon cancer Maternal Grandmother 7  . Liver cancer Maternal Grandmother   . Cervical cancer Maternal Aunt   . Stroke Paternal Uncle 8  . Melanoma Maternal Grandfather 55  . Colon polyps Maternal Grandfather   . AAA (abdominal aortic aneurysm) Paternal Grandmother   . Heart disease Paternal Grandfather   . Heart disease Paternal Uncle 67  . Colon cancer Other 3       MGM's mother  . Stomach cancer Other 73       MGM's mother  . Breast cancer Other        MGM's maternal aunt  . Cancer Other        MGM's maternal uncle with GI cancer  . Esophageal cancer Neg Hx   . Pancreatic cancer Neg Hx   . Rectal cancer Neg Hx     Social History: Social History   Socioeconomic History  . Marital status: Single    Spouse name: Not on file  . Number of children: 0  . Years of education: Not on file  . Highest education level: Not on file  Occupational History  . Occupation: Ship broker  . Occupation: Programmer, applications  Tobacco Use  . Smoking status: Former Smoker    Packs/day: 1.00    Years: 20.00    Pack years: 20.00    Types: Cigarettes    Quit date: 02/22/2013    Years since quitting: 6.4  . Smokeless tobacco: Never Used  Substance and Sexual Activity  . Alcohol use: Never    Alcohol/week: 0.0 standard drinks  . Drug use: No  . Sexual activity: Not Currently  Other Topics Concern  . Not on file  Social History Narrative   Working at [employer] doing [employer] alone (has a Neurosurgeon)   Working on a degree at [employer], will plan to return fall 2018   Enjoys spending time with family.   Friends and family in [place] Determinants of Health   Financial Resource  Strain:   . Difficulty of Paying Living Expenses: Not on file  Food Insecurity:   . Worried About Charity fundraiser in the  Last Year: Not on file  . Ran Out of Food in the Last Year: Not on file  Transportation Needs:   . Lack of Transportation (Medical): Not on file  . Lack of Transportation (Non-Medical): Not on file  Physical Activity:   . Days of Exercise per Week: Not on file  . Minutes of Exercise per Session: Not on file  Stress:   . Feeling of Stress : Not on file  Social Connections:   . Frequency of Communication with Friends and Family: Not on file  . Frequency of Social Gatherings with Friends and Family: Not on file  . Attends Religious Services: Not on file  . Active Member of Clubs or Organizations: Not on file  . Attends Archivist Meetings: Not on file  . Marital Status: Not on file  Intimate Partner Violence:   . Fear of Current or Ex-Partner: Not on file  . Emotionally Abused: Not on file  . Physically Abused: Not on file  . Sexually Abused: Not on file    Observations/Objective:   *** No acute distress.  Alert and oriented.  Speech fluent and not dysarthric.  Language intact.  Eyes orthophoric on primary gaze.  Face symmetric.  Assessment and Plan:   ***  Follow Up Instructions:    -I discussed the assessment and treatment plan with the patient. The patient was provided an opportunity to ask questions and all were answered. The patient agreed with the plan and demonstrated an understanding of the instructions.   The patient was advised to call back or seek an in-person evaluation if the symptoms worsen or if the condition fails to improve as anticipated.    Total Time spent in visit with the patient was:  ***, of which more than 50% of the time was spent in counseling and/or coordinating care on ***.   Pt understands and agrees with the plan of care outlined.     Dudley Major, DO

## 2019-08-02 ENCOUNTER — Telehealth: Payer: Managed Care, Other (non HMO) | Admitting: Neurology

## 2019-08-02 ENCOUNTER — Other Ambulatory Visit: Payer: Self-pay

## 2019-08-06 ENCOUNTER — Encounter: Payer: Self-pay | Admitting: Family

## 2019-08-07 ENCOUNTER — Encounter: Payer: Self-pay | Admitting: Neurology

## 2019-08-07 NOTE — Progress Notes (Signed)
Virtual Visit via Video Note The purpose of this virtual visit is to provide medical care while limiting exposure to the novel coronavirus.    Consent was obtained for video visit:  Yes.   Answered questions that patient had about telehealth interaction:  Yes.   I discussed the limitations, risks, security and privacy concerns of performing an evaluation and management service by telemedicine. I also discussed with the patient that there may be a patient responsible charge related to this service. The patient expressed understanding and agreed to proceed.  Pt location: Home Physician Location: office Name of referring provider:  Debbrah Alar, NP I connected with [name] at patients initiation/request on 08/08/2019 at  1:50 PM EST by video enabled telemedicine application and verified that I am speaking with the correct person using two identifiers. Pt MRN:  [id number] Pt DOB:  02-10-1981 Video Participants:  [name]   History of Present Illness:  Stephanie Miles is a 39 year old female with NASH, HTN, type 2 diabetes, fibromyalgia, OSA, depression and CKD with history of kidney stones who presents for migraines.  History supplemented by referring provider note.  She reports migraines since 39 years old.  They have become worse since October.  She has hepatocellular carcinoma and NASH.  Around that time, she started not feeling well and was found to have an elevated ammonia level of 54.  She was started on lactulose.  She also had fallen around that time and hit her head.  Previously, the migraines would occur every few months.  Since October, they occur about 4 times a month, lasting from a few hours to all day.  They are severe left or right sided headaches radiating down back of head.  There is associated nausea, photophobia, phonophobia, but no vomiting, visual disturbance or unilateral numbness or weakness.  No particular triggers.  Tylenol helps  ease the pain but does not resolve it.  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  Current NSAIDS:  Contraindicated Current analgesics:  Tylenol Current triptans:  none Current ergotamine:  none Current anti-emetic:  Zofran 30m Current muscle relaxants:  none Current anti-anxiolytic:  alprazolam Current sleep aide:  none Current Antihypertensive medications:  Toprol XL 1033m urosemide; losartan Current Antidepressant medications:  Wellbutrin XL 15066mCymbalta 23m64mily Current Anticonvulsant medications:  none Current anti-CGRP:  none Current Vitamins/Herbal/Supplements:  MVI; prenatal, K-dur Current Antihistamines/Decongestants:  Zyrtec; Flonase Other therapy:  none Hormone/birth control:  none Other medications:  lactulose  Past NSAIDS/steroid:  Ibuprofen; Mobic; naproxen; prednisone Past analgesics:  Excedrin Past abortive triptans:  Sumatriptan Past abortive ergotamine:  none Past muscle relaxants:  none Past anti-emetic:  Promethazine  Past antihypertensive medications:  lisinopril Past antidepressant medications:  Venlafaxine 150mg1mce daily; Lexapro; trazodone Past anticonvulsant medications:  none Past anti-CGRP:  none Past vitamins/Herbal/Supplements:  magnesium Past antihistamines/decongestants:  none Other past therapies:  none  Caffeine:  1 to 2 cups of coffee daily, 1 to 2 Red Bulls daily Alcohol:  no Smoker:  no Diet:  Hydrates with water, Gatorade Zero Exercise:  Walks her dog Depression:  stable; Anxiety:  yes Other pain:  fibromyalgia Sleep hygiene:  OSA.  Uses CPAP. Family history of headache:  Brother, sister  05/07/2019 LABS:  CMP with Na 136, K 4, Cl 101, CO2 28, glucose 116, BUN 11, Cr 0.83, t bili 0.4, ALP 102, AST 24, ALT 35; B12 945, folate 20.6; TSH 1.12;  RPR nonreactive 06/25/2019 LABS:  BMP with Na 138, K 3.8, Cl 102, CO2 27, glucose 116, BUN 14, Cr 0.93   Past Medical  History: Past Medical History:  Diagnosis Date  . Allergy   . Anxiety   . Aortic regurgitation due to bicuspid aortic valve   . Arthritis   . Borderline diabetes 10/20/2016  . Chronic kidney disease    kidney stones  . Depression   . Diabetes type 2, controlled ([place]) 10/20/2016  . Family history of breast cancer in female   . Family history of colon cancer   . Fatty liver 11/28/2016  . GERD (gastroesophageal reflux disease)   . Heart murmur   . Hypertension   . IBS (irritable bowel syndrome)   . Insomnia   . Liver cancer ([place]) 03/2017  . Migraine headache   . NASH (nonalcoholic steatohepatitis) 04/04/2017   Per biopsy 9/18  . OCD (obsessive compulsive disorder)   . Orbital cellulitis    at age 86 was hospitalized for 3 months (almost died)   . OSA (obstructive sleep apnea)   . S/P appy 05/2013  . Sleep apnea     Medications: Outpatient Encounter Medications as of 08/08/2019  Medication Sig Note  . ACCU-CHEK FASTCLIX LANCETS MISC Check sugar twice daily   . ACCU-CHEK GUIDE test strip USE TO CHECK BLOOD SUGAR TWO TIMES A DAY   . acetaminophen (TYLENOL) 500 MG tablet Take 500-1,000 mg by mouth every 6 (six) hours as needed (for pain).    Marland Kitchen albuterol (VENTOLIN HFA) 108 (90 Base) MCG/ACT inhaler Inhale 2 puffs into the lungs every 6 (six) hours as needed for wheezing or shortness of breath.   . ALPRAZolam (XANAX) 0.5 MG tablet TAKE ONE TABLET BY MOUTH TWICE A DAY AS NEEDED   . Blood Glucose Monitoring Suppl (ACCU-CHEK GUIDE) w/Device KIT 1 each by Does not apply route daily.   . budesonide-formoterol (SYMBICORT) 160-4.5 MCG/ACT inhaler INHALE TWO PUFFS BY MOUTH TWICE A DAY   . buPROPion (WELLBUTRIN XL) 150 MG 24 hr tablet TAKE ONE TABLET BY MOUTH DAILY   . cetirizine (ZYRTEC) 10 MG tablet Take 10 mg by mouth daily.   . Cholecalciferol (VITAMIN D3) 50 MCG (2000 UT) TABS Take 2,000 Units by mouth daily.   . clotrimazole-betamethasone (LOTRISONE) cream Apply 1 application topically 2  (two) times daily.   Marland Kitchen dicyclomine (BENTYL) 20 MG tablet TAKE ONE TABLET BY MOUTH FOUR TIMES A DAY BEFORE MEALS AND AT BEDTIME   . DOXORUBICIN HCL IV Inject into the vein See admin instructions.  07/30/2018: Patient stated this is to remain on her med list  . DULoxetine (CYMBALTA) 60 MG capsule Take 1 capsule (60 mg total) by mouth daily.   . fluconazole (DIFLUCAN) 150 MG tablet Take 1 tablet by mouth today, repeat in 1 week.   . fluticasone (FLONASE) 50 MCG/ACT nasal spray SPRAY TWO SPRAYS IN EACH NOSTRIL ONCE DAILY   . furosemide (LASIX) 40 MG tablet TAKE ONE TABLET BY MOUTH EVERY MORNING   . Lactulose 20 GM/30ML SOLN 15 ML by mouth twice daily.  May titrate as needed to 11m three times daily with goal of 2-3 soft BM's/day   . losartan (COZAAR) 100 MG tablet Take 1 tablet (100 mg total) by mouth daily.   . metFORMIN (GLUCOPHAGE-XR) 500 MG 24 hr tablet TAKE TWO TABLETS BY MOUTH DAILY WITH BREAKFAST   . metoprolol succinate (TOPROL-XL) 100 MG 24 hr tablet Take 1 tablet (100 mg total) by mouth daily.  WITH OR IMMEDIATELY FOLLOWING A MEAL   . montelukast (SINGULAIR) 10 MG tablet TAKE ONE TABLET BY MOUTH EVERY NIGHT AT BEDTIME   . Multiple Vitamins-Minerals (MULTIVITAMIN WITH MINERALS) tablet Take 1 tablet by mouth daily. 07/30/2018: Takes a PNV, also  . nystatin (NYSTATIN) powder Apply topically 2 (two) times daily.   Marland Kitchen omeprazole (PRILOSEC) 20 MG capsule TAKE TWO CAPSULES BY MOUTH EVERY MORNING BEFORE BREAKFAST AND 1 CAPSULE EVERY EVENING BEFORE SUPPER   . ondansetron (ZOFRAN) 4 MG tablet Take 1 tablet (4 mg total) by mouth every 8 (eight) hours as needed for nausea or vomiting.   . potassium chloride (K-DUR) 10 MEQ tablet TAKE ONE TABLET BY MOUTH EVERY OTHER DAY   . Prenatal Vit-Fe Fumarate-FA (MULTIVITAMIN-PRENATAL) 27-0.8 MG TABS tablet Take 1 tablet by mouth daily.     No facility-administered encounter medications on file as of 08/08/2019.    Allergies: Allergies  Allergen Reactions  . Dust  Mite Extract Shortness Of Breath and Cough  . Mold Extract [Trichophyton] Shortness Of Breath and Cough  . Nsaids Other (See Comments)    Patient is to NOT have NSAIDS  . Codeine Nausea Only    Required use of an antiemetic   . Amlodipine Swelling and Other (See Comments)    Edema   . Tape Rash    Medical tape and Band-Aids aren't tolerated    Family History: Family History  Problem Relation Age of Onset  . Asthma Mother   . Hypertension Mother   . Breast cancer Father 28  . Diabetes Father   . Hypertension Father   . Heart disease Father   . Hyperlipidemia Father   . Asthma Sister   . Hyperlipidemia Brother   . Hypertension Brother   . Vision loss Brother   . Colon cancer Maternal Grandmother 72  . Liver cancer Maternal Grandmother   . Cervical cancer Maternal Aunt   . Stroke Paternal Uncle 55  . Melanoma Maternal Grandfather 77  . Colon polyps Maternal Grandfather   . AAA (abdominal aortic aneurysm) Paternal Grandmother   . Heart disease Paternal Grandfather   . Heart disease Paternal Uncle 25  . Colon cancer Other 27       MGM's mother  . Stomach cancer Other 45       MGM's mother  . Breast cancer Other        MGM's maternal aunt  . Cancer Other        MGM's maternal uncle with GI cancer  . Esophageal cancer Neg Hx   . Pancreatic cancer Neg Hx   . Rectal cancer Neg Hx     Social History: Social History   Socioeconomic History  . Marital status: Single    Spouse name: Not on file  . Number of children: 0  . Years of education: Not on file  . Highest education level: Not on file  Occupational History  . Occupation: Ship broker  . Occupation: Programmer, applications  Tobacco Use  . Smoking status: Former Smoker    Packs/day: 1.00    Years: 20.00    Pack years: 20.00    Types: Cigarettes    Quit date: 02/22/2013    Years since quitting: 6.4  . Smokeless tobacco: Never Used  Substance and Sexual Activity  . Alcohol use: Never    Alcohol/week: 0.0 standard drinks   . Drug use: No  . Sexual activity: Not Currently  Other Topics Concern  . Not on file  Social History Narrative  Working at [employer] doing [employer] alone (has a Neurosurgeon)   Working on a degree at [employer], will plan to return fall 2018   Enjoys spending time with family.   Friends and family in [place] Determinants of Health   Financial Resource Strain:   . Difficulty of Paying Living Expenses: Not on file  Food Insecurity:   . Worried About Charity fundraiser in the Last Year: Not on file  . Ran Out of Food in the Last Year: Not on file  Transportation Needs:   . Lack of Transportation (Medical): Not on file  . Lack of Transportation (Non-Medical): Not on file  Physical Activity:   . Days of Exercise per Week: Not on file  . Minutes of Exercise per Session: Not on file  Stress:   . Feeling of Stress : Not on file  Social Connections:   . Frequency of Communication with Friends and Family: Not on file  . Frequency of Social Gatherings with Friends and Family: Not on file  . Attends Religious Services: Not on file  . Active Member of Clubs or Organizations: Not on file  . Attends Archivist Meetings: Not on file  . Marital Status: Not on file  Intimate Partner Violence:   . Fear of Current or Ex-Partner: Not on file  . Emotionally Abused: Not on file  . Physically Abused: Not on file  . Sexually Abused: Not on file    Observations/Objective:   There were no vitals taken for this visit. No acute distress.  Alert and oriented.  Speech fluent and not dysarthric.  Language intact.    Assessment and Plan:   Migraine without aura, without status migrainosus, not intractable.  For preventative therapy, I would like to start a CGRP inhibitor.  She is already on a beta blocker.  She has previously tried venlafaxine.  I would avoid topiramate due to history of kidney stones.  For abortive therapy, I would like to try Ubrelvy.  I would rather  not use a triptan given her hepatic disease.  She was advised to avoid NSAIDs to prevent development of renal insufficiency in case she would need a liver transplant.  She has failed Tylenol  1.  For preventative management, will start Aimovig 8m 2.  For abortive therapy, Ubrelvy 1026m3.  Limit use of pain relievers to no more than 2 days out of week to prevent risk of rebound or medication-overuse headache. 4.  Keep headache diary 5.  Exercise, hydration, caffeine cessation, sleep hygiene, monitor for and avoid triggers 6.  Follow up 4 months.   Follow Up Instructions:    -I discussed the assessment and treatment plan with the patient. The patient was provided an opportunity to ask questions and all were answered. The patient agreed with the plan and demonstrated an understanding of the instructions.   The patient was advised to call back or seek an in-person evaluation if the symptoms worsen or if the condition fails to improve as anticipated.   AdDudley MajorDO

## 2019-08-08 ENCOUNTER — Encounter: Payer: Self-pay | Admitting: Family

## 2019-08-08 ENCOUNTER — Other Ambulatory Visit: Payer: Self-pay

## 2019-08-08 ENCOUNTER — Encounter: Payer: Self-pay | Admitting: Neurology

## 2019-08-08 ENCOUNTER — Telehealth (INDEPENDENT_AMBULATORY_CARE_PROVIDER_SITE_OTHER): Payer: Managed Care, Other (non HMO) | Admitting: Neurology

## 2019-08-08 DIAGNOSIS — G43009 Migraine without aura, not intractable, without status migrainosus: Secondary | ICD-10-CM

## 2019-08-08 DIAGNOSIS — C220 Liver cell carcinoma: Secondary | ICD-10-CM

## 2019-08-08 MED ORDER — UBRELVY 100 MG PO TABS: 1.0000 | ORAL_TABLET | ORAL | 11 refills | Status: DC | PRN

## 2019-08-08 MED ORDER — AIMOVIG 70 MG/ML ~~LOC~~ SOAJ: 70.0000 mg | SUBCUTANEOUS | 11 refills | Status: DC

## 2019-08-09 ENCOUNTER — Other Ambulatory Visit: Payer: Self-pay

## 2019-08-09 NOTE — Telephone Encounter (Signed)
Per patient Dr. Edison Pace at [facility] will send Lenna Sciara a message about some labs patient needs so that we can order and get done. Patient was scheduled for Tuesday pm. Waiting for Dr Rolm Bookbinder note.

## 2019-08-09 NOTE — Progress Notes (Unsigned)
Sent to cover my meds for [id number]. awating determination for Ubrelvy 139m. Sent to scan

## 2019-08-13 ENCOUNTER — Other Ambulatory Visit (INDEPENDENT_AMBULATORY_CARE_PROVIDER_SITE_OTHER): Payer: Managed Care, Other (non HMO)

## 2019-08-13 ENCOUNTER — Other Ambulatory Visit: Payer: Self-pay

## 2019-08-13 DIAGNOSIS — C220 Liver cell carcinoma: Secondary | ICD-10-CM

## 2019-08-13 LAB — COMPREHENSIVE METABOLIC PANEL
ALT: 27 U/L (ref 0–35)
AST: 19 U/L (ref 0–37)
Albumin: 4.2 g/dL (ref 3.5–5.2)
Alkaline Phosphatase: 96 U/L (ref 39–117)
BUN: 13 mg/dL (ref 6–23)
CO2: 28 mEq/L (ref 19–32)
Calcium: 9.5 mg/dL (ref 8.4–10.5)
Chloride: 103 mEq/L (ref 96–112)
Creatinine, Ser: 0.84 mg/dL (ref 0.40–1.20)
GFR: 74.84 mL/min (ref >60.00)
Glucose, Bld: 108 mg/dL — ABNORMAL HIGH (ref 70–99)
Potassium: 4 mEq/L (ref 3.5–5.1)
Sodium: 138 mEq/L (ref 135–145)
Total Bilirubin: 0.3 mg/dL (ref 0.2–1.2)
Total Protein: 6.9 g/dL (ref 6.0–8.3)

## 2019-08-13 LAB — CBC WITH DIFFERENTIAL/PLATELET
Basophils Absolute: 0.1 10*3/uL (ref 0.0–0.1)
Basophils Relative: 0.7 % (ref 0.0–3.0)
Eosinophils Absolute: 0.3 10*3/uL (ref 0.0–0.7)
Eosinophils Relative: 2.3 % (ref 0.0–5.0)
HCT: 41.7 % (ref 36.0–46.0)
Hemoglobin: 14 g/dL (ref 12.0–15.0)
Lymphocytes Relative: 36.6 % (ref 12.0–46.0)
Lymphs Abs: 5.3 10*3/uL — ABNORMAL HIGH (ref 0.7–4.0)
MCHC: 33.6 g/dL (ref 30.0–36.0)
MCV: 87.1 fl (ref 78.0–100.0)
Monocytes Absolute: 1 10*3/uL (ref 0.1–1.0)
Monocytes Relative: 7.2 % (ref 3.0–12.0)
Neutro Abs: 7.6 10*3/uL (ref 1.4–7.7)
Neutrophils Relative %: 53.2 % (ref 43.0–77.0)
Platelets: 279 10*3/uL (ref 150.0–400.0)
RBC: 4.79 Mil/uL (ref 3.87–5.11)
RDW: 13.5 % (ref 11.5–15.5)
WBC: 14.3 10*3/uL — ABNORMAL HIGH (ref 4.0–10.5)

## 2019-08-14 ENCOUNTER — Encounter: Payer: Self-pay | Admitting: Family

## 2019-08-14 LAB — AFP TUMOR MARKER: AFP-Tumor Marker: 2.6 ng/mL

## 2019-08-14 NOTE — Progress Notes (Signed)
aimovig PA started in cover my meds  Waiting for Determination from [facility]

## 2019-08-15 ENCOUNTER — Telehealth: Payer: Self-pay | Admitting: Neurology

## 2019-08-15 NOTE — Telephone Encounter (Signed)
See mychart.  

## 2019-08-15 NOTE — Telephone Encounter (Signed)
I faxed labs to [facility] [phone].

## 2019-08-15 NOTE — Telephone Encounter (Signed)
[facility] called and needed to get this patients lab results faxed to [phone]. Thank you

## 2019-08-16 ENCOUNTER — Other Ambulatory Visit: Payer: Self-pay

## 2019-08-16 ENCOUNTER — Ambulatory Visit (INDEPENDENT_AMBULATORY_CARE_PROVIDER_SITE_OTHER): Payer: Managed Care, Other (non HMO) | Admitting: Family

## 2019-08-16 ENCOUNTER — Encounter: Payer: Self-pay | Admitting: Family

## 2019-08-16 DIAGNOSIS — G43909 Migraine, unspecified, not intractable, without status migrainosus: Secondary | ICD-10-CM | POA: Diagnosis not present

## 2019-08-16 DIAGNOSIS — B349 Viral infection, unspecified: Secondary | ICD-10-CM

## 2019-08-16 NOTE — Progress Notes (Signed)
Virtual Visit via Video Note  I connected with Stephanie Miles on 08/16/19 at  3:20 PM EST by a video enabled telemedicine application and verified that I am speaking with the correct person using two identifiers.  Location: Patient: home Provider: work   I discussed the limitations of evaluation and management by telemedicine and the availability of in person appointments. The patient expressed understanding and agreed to proceed.  History of Present Illness:  Patient is a 39 yr old female who presents toady with c/o "flu symptoms."  Reports that she has had generalized body aches.  Denies congestion, mild sore throat which she attributes to snoring at night but she has been wearing her cpap. Reports feeling generally unwell. Denies loss of taste/smell.  Denies coughing or congestion. Had a CT at [facility].    Migraine- she is working with neurology for this.    Past Medical History:  Diagnosis Date  . Allergy   . Anxiety   . Aortic regurgitation due to bicuspid aortic valve   . Arthritis   . Borderline diabetes 10/20/2016  . Chronic kidney disease    kidney stones  . Depression   . Diabetes type 2, controlled ([place]) 10/20/2016  . Family history of breast cancer in female   . Family history of colon cancer   . Fatty liver 11/28/2016  . GERD (gastroesophageal reflux disease)   . Heart murmur   . Hypertension   . IBS (irritable bowel syndrome)   . Insomnia   . Liver cancer ([place]) 03/2017  . Migraine headache   . NASH (nonalcoholic steatohepatitis) 04/04/2017   Per biopsy 9/18  . OCD (obsessive compulsive disorder)   . Orbital cellulitis    at age 14 was hospitalized for 3 months (almost died)   . OSA (obstructive sleep apnea)   . S/P appy 05/2013  . Sleep apnea      Social History   Socioeconomic History  . Marital status: Single    Spouse name: Not on file  . Number of children: 0  . Years of education: Not on file  . Highest education level: Not on file  Occupational  History  . Occupation: Ship broker  . Occupation: Programmer, applications  Tobacco Use  . Smoking status: Former Smoker    Packs/day: 1.00    Years: 20.00    Pack years: 20.00    Types: Cigarettes    Quit date: 02/22/2013    Years since quitting: 6.4  . Smokeless tobacco: Never Used  Substance and Sexual Activity  . Alcohol use: Never    Alcohol/week: 0.0 standard drinks  . Drug use: No  . Sexual activity: Not Currently  Other Topics Concern  . Not on file  Social History Narrative   Working at [employer] doing [employer] alone (has a Neurosurgeon)   Working on a degree at [employer], will plan to return fall 2018   Enjoys spending time with family.   Friends and family in [place] Determinants of Health   Financial Resource Strain:   . Difficulty of Paying Living Expenses: Not on file  Food Insecurity:   . Worried About Charity fundraiser in the Last Year: Not on file  . Ran Out of Food in the Last Year: Not on file  Transportation Needs:   . Lack of Transportation (Medical): Not on file  . Lack of Transportation (Non-Medical): Not on file  Physical Activity:   . Days of Exercise per  Week: Not on file  . Minutes of Exercise per Session: Not on file  Stress:   . Feeling of Stress : Not on file  Social Connections:   . Frequency of Communication with Friends and Family: Not on file  . Frequency of Social Gatherings with Friends and Family: Not on file  . Attends Religious Services: Not on file  . Active Member of Clubs or Organizations: Not on file  . Attends Archivist Meetings: Not on file  . Marital Status: Not on file  Intimate Partner Violence:   . Fear of Current or Ex-Partner: Not on file  . Emotionally Abused: Not on file  . Physically Abused: Not on file  . Sexually Abused: Not on file    Past Surgical History:  Procedure Laterality Date  . COLONOSCOPY    . DENTAL SURGERY     wisdom teeth  . ESOPHAGOGASTRODUODENOSCOPY    . LAPAROSCOPIC  APPENDECTOMY N/A 06/06/2013   Procedure: APPENDECTOMY LAPAROSCOPIC;  Surgeon: Imogene Burn. Georgette Dover, MD;  Location: [facility] OR;  Service: General;  Laterality: N/A;  . LIVER BIOPSY      Family History  Problem Relation Age of Onset  . Asthma Mother   . Hypertension Mother   . Breast cancer Father 75  . Diabetes Father   . Hypertension Father   . Heart disease Father   . Hyperlipidemia Father   . Asthma Sister   . Hyperlipidemia Brother   . Hypertension Brother   . Vision loss Brother   . Colon cancer Maternal Grandmother 23  . Liver cancer Maternal Grandmother   . Cervical cancer Maternal Aunt   . Stroke Paternal Uncle 57  . Melanoma Maternal Grandfather 46  . Colon polyps Maternal Grandfather   . AAA (abdominal aortic aneurysm) Paternal Grandmother   . Heart disease Paternal Grandfather   . Heart disease Paternal Uncle 27  . Colon cancer Other 8       MGM's mother  . Stomach cancer Other 75       MGM's mother  . Breast cancer Other        MGM's maternal aunt  . Cancer Other        MGM's maternal uncle with GI cancer  . Esophageal cancer Neg Hx   . Pancreatic cancer Neg Hx   . Rectal cancer Neg Hx     Allergies  Allergen Reactions  . Dust Mite Extract Shortness Of Breath and Cough  . Mold Extract [Trichophyton] Shortness Of Breath and Cough  . Nsaids Other (See Comments)    Patient is to NOT have NSAIDS  . Codeine Nausea Only    Required use of an antiemetic   . Amlodipine Swelling and Other (See Comments)    Edema   . Tape Rash    Medical tape and Band-Aids aren't tolerated    Current Outpatient Medications on File Prior to Visit  Medication Sig Dispense Refill  . ACCU-CHEK FASTCLIX LANCETS MISC Check sugar twice daily 102 each 3  . ACCU-CHEK GUIDE test strip USE TO CHECK BLOOD SUGAR TWO TIMES A DAY 100 each 0  . albuterol (VENTOLIN HFA) 108 (90 Base) MCG/ACT inhaler Inhale 2 puffs into the lungs every 6 (six) hours as needed for wheezing or shortness of breath.     . ALPRAZolam (XANAX) 0.5 MG tablet TAKE ONE TABLET BY MOUTH TWICE A DAY AS NEEDED 45 tablet 0  . Blood Glucose Monitoring Suppl (ACCU-CHEK GUIDE) w/Device KIT 1 each by Does not apply  route daily. 1 kit 0  . budesonide-formoterol (SYMBICORT) 160-4.5 MCG/ACT inhaler INHALE TWO PUFFS BY MOUTH TWICE A DAY 10.2 g 5  . buPROPion (WELLBUTRIN XL) 150 MG 24 hr tablet TAKE ONE TABLET BY MOUTH DAILY 90 tablet 0  . cetirizine (ZYRTEC) 10 MG tablet Take 10 mg by mouth daily.    . Cholecalciferol (VITAMIN D3) 50 MCG (2000 UT) TABS Take 2,000 Units by mouth daily.    . clotrimazole-betamethasone (LOTRISONE) cream Apply 1 application topically 2 (two) times daily. 45 g 1  . dicyclomine (BENTYL) 20 MG tablet TAKE ONE TABLET BY MOUTH FOUR TIMES A DAY BEFORE MEALS AND AT BEDTIME 120 tablet 3  . DULoxetine (CYMBALTA) 60 MG capsule Take 1 capsule (60 mg total) by mouth daily. 30 capsule 5  . Erenumab-aooe (AIMOVIG) 70 MG/ML SOAJ Inject 70 mg into the skin every 30 (thirty) days. 1 pen 11  . fluconazole (DIFLUCAN) 150 MG tablet Take 1 tablet by mouth today, repeat in 1 week. 2 tablet 0  . fluticasone (FLONASE) 50 MCG/ACT nasal spray SPRAY TWO SPRAYS IN EACH NOSTRIL ONCE DAILY 16 g 3  . furosemide (LASIX) 40 MG tablet TAKE ONE TABLET BY MOUTH EVERY MORNING 90 tablet 1  . Lactulose 20 GM/30ML SOLN 15 ML by mouth twice daily.  May titrate as needed to 36m three times daily with goal of 2-3 soft BM's/day 1892 mL 5  . losartan (COZAAR) 100 MG tablet Take 1 tablet (100 mg total) by mouth daily. 90 tablet 1  . metFORMIN (GLUCOPHAGE-XR) 500 MG 24 hr tablet TAKE TWO TABLETS BY MOUTH DAILY WITH BREAKFAST 180 tablet 1  . metoprolol succinate (TOPROL-XL) 100 MG 24 hr tablet Take 1 tablet (100 mg total) by mouth daily. WITH OR IMMEDIATELY FOLLOWING A MEAL 90 tablet 1  . montelukast (SINGULAIR) 10 MG tablet TAKE ONE TABLET BY MOUTH EVERY NIGHT AT BEDTIME 30 tablet 1  . Multiple Vitamins-Minerals (MULTIVITAMIN WITH MINERALS)  tablet Take 1 tablet by mouth daily.    .Marland Kitchennystatin (NYSTATIN) powder Apply topically 2 (two) times daily. 30 g 2  . omeprazole (PRILOSEC) 20 MG capsule TAKE TWO CAPSULES BY MOUTH EVERY MORNING BEFORE BREAKFAST AND 1 CAPSULE EVERY EVENING BEFORE SUPPER 90 capsule 4  . ondansetron (ZOFRAN) 4 MG tablet Take 1 tablet (4 mg total) by mouth every 8 (eight) hours as needed for nausea or vomiting. 20 tablet 0  . potassium chloride (K-DUR) 10 MEQ tablet TAKE ONE TABLET BY MOUTH EVERY OTHER DAY 15 tablet 5  . Prenatal Vit-Fe Fumarate-FA (MULTIVITAMIN-PRENATAL) 27-0.8 MG TABS tablet Take 1 tablet by mouth daily.     .Marland KitchenUbrogepant (UBRELVY) 100 MG TABS Take 1 tablet by mouth as needed (May repeat in 2 hours.  Maximum 2 tablets in 24 hours). 10 tablet 11  . acetaminophen (TYLENOL) 500 MG tablet Take 500-1,000 mg by mouth every 6 (six) hours as needed (for pain).     . DOXORUBICIN HCL IV Inject into the vein See admin instructions.      No current facility-administered medications on file prior to visit.    There were no vitals taken for this visit.     Observations/Objective:   Gen: Awake, alert, no acute distress Resp: Breathing is even and non-labored Psych: calm/pleasant demeanor Neuro: Alert and Oriented x 3, + facial symmetry, speech is clear.   Assessment and Plan:  Viral illness- recommended that she be tested for covid-19 and gave her the following ways to schedule.  1.  text "covid" to [id number]    or   2.  Schedule at [email or web address]   or    3.  Call [phone]  Migraines- advised pt to follow back up with neurology for this.     Follow Up Instructions:    I discussed the assessment and treatment plan with the patient. The patient was provided an opportunity to ask questions and all were answered. The patient agreed with the plan and demonstrated an understanding of the instructions.   The patient was advised to call back or seek an in-person evaluation if the  symptoms worsen or if the condition fails to improve as anticipated.  Nance Pear, NP

## 2019-08-18 ENCOUNTER — Encounter: Payer: Self-pay | Admitting: Family

## 2019-08-19 ENCOUNTER — Encounter: Payer: Self-pay | Admitting: Family

## 2019-08-23 ENCOUNTER — Other Ambulatory Visit: Payer: Self-pay | Admitting: Family

## 2019-08-23 ENCOUNTER — Other Ambulatory Visit: Payer: Self-pay | Admitting: Family Medicine

## 2019-08-26 NOTE — Telephone Encounter (Signed)
Last RX:07/22/2019 Last OV:08/16/2019 Next AC:GBKO scheduled UDS:04/27/2018 CSC:04/27/2018 CSR:no discrepancies

## 2019-09-20 ENCOUNTER — Encounter: Payer: Self-pay | Admitting: Family

## 2019-09-22 ENCOUNTER — Encounter: Payer: Self-pay | Admitting: Family

## 2019-09-23 ENCOUNTER — Telehealth: Payer: Self-pay | Admitting: Family

## 2019-09-23 NOTE — Telephone Encounter (Signed)
Patient is requesting a Gardasil series, patient states that due to her work schedule she would like to come early morning around 8:15 on March 9TH if possible please advise. Advised patient that we do not have that time available .

## 2019-09-24 ENCOUNTER — Encounter: Payer: Self-pay | Admitting: Cardiology

## 2019-09-24 ENCOUNTER — Other Ambulatory Visit: Payer: Self-pay

## 2019-09-24 ENCOUNTER — Ambulatory Visit (INDEPENDENT_AMBULATORY_CARE_PROVIDER_SITE_OTHER): Payer: Managed Care, Other (non HMO) | Admitting: Cardiology

## 2019-09-24 VITALS — BP 110/72 | HR 79 | Ht 66.0 in | Wt 276.0 lb

## 2019-09-24 DIAGNOSIS — C220 Liver cell carcinoma: Secondary | ICD-10-CM

## 2019-09-24 DIAGNOSIS — E781 Pure hyperglyceridemia: Secondary | ICD-10-CM

## 2019-09-24 DIAGNOSIS — Q231 Congenital insufficiency of aortic valve: Secondary | ICD-10-CM | POA: Diagnosis not present

## 2019-09-24 NOTE — Telephone Encounter (Signed)
Spoke with patient, nurse visit scheduled.

## 2019-09-24 NOTE — Progress Notes (Signed)
Faxed Provider Reconsideration Appeal Form to [facility] for Aimovig PA. Fax #: [phone] Awaiting response for appeal.

## 2019-09-24 NOTE — Progress Notes (Signed)
Cardiology Office Note:    Date:  09/24/2019   ID:  Stephanie Miles, DOB [birth year], MRN [id number]  PCP:  Debbrah Alar, NP  Cardiologist:  Jenean Lindau, MD   Referring MD: Debbrah Alar, NP    ASSESSMENT:    1. Aortic regurgitation due to bicuspid aortic valve   2. Hypertriglyceridemia   3. Hepatocellular carcinoma ([place])    PLAN:    In order of problems listed above:  1. Primary prevention stressed with the patient.  Importance of compliance with diet medication stressed and she vocalized understanding. 2. Bicuspid valve with mild aortic regurgitation: Patient has multiple comorbidities.  We will monitor this.  Echocardiogram will be done to assess this. 3. Patient had multiple questions which were answered to her satisfaction.  Importance of regular exercise and compliance with medication stressed and she is agreeable. 4. Patient will be seen in follow-up appointment in 6 months or earlier if the patient has any concerns    Medication Adjustments/Labs and Tests Ordered: Current medicines are reviewed at length with the patient today.  Concerns regarding medicines are outlined above.  Orders Placed This Encounter  Procedures  . EKG 12-Lead   No orders of the defined types were placed in this encounter.    Chief Complaint  Patient presents with  . Follow-up    6 Months     History of Present Illness:    Stephanie Miles is a 39 y.o. female.  Evaluate for aortic regurgitation.  Denies any problems at this time.  No chest pain orthopnea or PND.  At the time of my evaluation, the patient is alert awake oriented and in no distress.  Past Medical History:  Diagnosis Date  . Allergy   . Anxiety   . Aortic regurgitation due to bicuspid aortic valve   . Arthritis   . Borderline diabetes 10/20/2016  . Chronic kidney disease    kidney stones  . Depression   . Diabetes type 2, controlled ([place]) 10/20/2016  . Family history of breast cancer  in female   . Family history of colon cancer   . Fatty liver 11/28/2016  . GERD (gastroesophageal reflux disease)   . Heart murmur   . Hypertension   . IBS (irritable bowel syndrome)   . Insomnia   . Liver cancer ([place]) 03/2017  . Migraine headache   . NASH (nonalcoholic steatohepatitis) 04/04/2017   Per biopsy 9/18  . OCD (obsessive compulsive disorder)   . Orbital cellulitis    at age 76 was hospitalized for 3 months (almost died)   . OSA (obstructive sleep apnea)   . S/P appy 05/2013  . Sleep apnea     Past Surgical History:  Procedure Laterality Date  . COLONOSCOPY    . DENTAL SURGERY     wisdom teeth  . ESOPHAGOGASTRODUODENOSCOPY    . LAPAROSCOPIC APPENDECTOMY N/A 06/06/2013   Procedure: APPENDECTOMY LAPAROSCOPIC;  Surgeon: Imogene Burn. Georgette Dover, MD;  Location: [facility] OR;  Service: General;  Laterality: N/A;  . LIVER BIOPSY      Current Medications: Current Meds  Medication Sig  . ACCU-CHEK FASTCLIX LANCETS MISC Check sugar twice daily  . ACCU-CHEK GUIDE test strip USE TO CHECK BLOOD SUGAR TWO TIMES A DAY  . acetaminophen (TYLENOL) 500 MG tablet Take 500-1,000 mg by mouth every 6 (six) hours as needed (for pain).   Marland Kitchen albuterol (VENTOLIN HFA) 108 (90 Base) MCG/ACT inhaler Inhale 2 puffs into the lungs every 6 (six) hours as needed for  wheezing or shortness of breath.  . ALPRAZolam (XANAX) 0.5 MG tablet TAKE ONE TABLET BY MOUTH TWICE A DAY AS NEEDED  . Blood Glucose Monitoring Suppl (ACCU-CHEK GUIDE) w/Device KIT 1 each by Does not apply route daily.  . budesonide-formoterol (SYMBICORT) 160-4.5 MCG/ACT inhaler INHALE TWO PUFFS BY MOUTH TWICE A DAY  . buPROPion (WELLBUTRIN XL) 150 MG 24 hr tablet TAKE ONE TABLET BY MOUTH DAILY  . cetirizine (ZYRTEC) 10 MG tablet Take 10 mg by mouth daily.  . Cholecalciferol (VITAMIN D3) 50 MCG (2000 UT) TABS Take 2,000 Units by mouth daily.  . clotrimazole-betamethasone (LOTRISONE) cream Apply 1 application topically 2 (two) times daily.  Marland Kitchen dicyclomine  (BENTYL) 20 MG tablet TAKE ONE TABLET BY MOUTH FOUR TIMES A DAY BEFORE MEALS AND AT BEDTIME  . DULoxetine (CYMBALTA) 60 MG capsule Take 1 capsule (60 mg total) by mouth daily.  . fluticasone (FLONASE) 50 MCG/ACT nasal spray SPRAY TWO SPRAYS IN EACH NOSTRIL ONCE DAILY  . furosemide (LASIX) 40 MG tablet TAKE ONE TABLET BY MOUTH EVERY MORNING  . Lactulose 20 GM/30ML SOLN 15 ML by mouth twice daily.  May titrate as needed to 45m three times daily with goal of 2-3 soft BM's/day  . losartan (COZAAR) 100 MG tablet Take 1 tablet (100 mg total) by mouth daily.  . metFORMIN (GLUCOPHAGE-XR) 500 MG 24 hr tablet TAKE TWO TABLETS BY MOUTH DAILY WITH BREAKFAST  . metoprolol succinate (TOPROL-XL) 100 MG 24 hr tablet Take 1 tablet (100 mg total) by mouth daily. WITH OR IMMEDIATELY FOLLOWING A MEAL  . montelukast (SINGULAIR) 10 MG tablet TAKE ONE TABLET BY MOUTH EVERY NIGHT AT BEDTIME  . Multiple Vitamins-Minerals (MULTIVITAMIN WITH MINERALS) tablet Take 1 tablet by mouth daily.  .Marland Kitchennystatin (NYSTATIN) powder Apply topically 2 (two) times daily.  .Marland Kitchenomeprazole (PRILOSEC) 20 MG capsule TAKE TWO CAPSULES BY MOUTH EVERY MORNING BEFORE BREAKFAST AND 1 CAPSULE EVERY EVENING BEFORE SUPPER  . ondansetron (ZOFRAN) 4 MG tablet Take 1 tablet (4 mg total) by mouth every 8 (eight) hours as needed for nausea or vomiting.  . potassium chloride (K-DUR) 10 MEQ tablet TAKE ONE TABLET BY MOUTH EVERY OTHER DAY  . Prenatal Vit-Fe Fumarate-FA (MULTIVITAMIN-PRENATAL) 27-0.8 MG TABS tablet Take 1 tablet by mouth daily.   .Marland KitchenUbrogepant (UBRELVY) 100 MG TABS Take 1 tablet by mouth as needed (May repeat in 2 hours.  Maximum 2 tablets in 24 hours).     Allergies:   Dust mite extract, Mold extract [trichophyton], Nsaids, Codeine, Amlodipine, and Tape   Social History   Socioeconomic History  . Marital status: Single    Spouse name: Not on file  . Number of children: 0  . Years of education: Not on file  . Highest education level: Not on  file  Occupational History  . Occupation: SShip broker . Occupation: MProgrammer, applications Tobacco Use  . Smoking status: Former Smoker    Packs/day: 1.00    Years: 20.00    Pack years: 20.00    Types: Cigarettes    Quit date: 02/22/2013    Years since quitting: 6.5  . Smokeless tobacco: Never Used  Substance and Sexual Activity  . Alcohol use: Never    Alcohol/week: 0.0 standard drinks  . Drug use: No  . Sexual activity: Not Currently  Other Topics Concern  . Not on file  Social History Narrative   Working at [employer] (has a cNeurosurgeon   Working on a degree  at [employer], will plan to return fall 2018   Enjoys spending time with family.   Friends and family in [place] Determinants of Health   Financial Resource Strain:   . Difficulty of Paying Living Expenses: Not on file  Food Insecurity:   . Worried About Charity fundraiser in the Last Year: Not on file  . Ran Out of Food in the Last Year: Not on file  Transportation Needs:   . Lack of Transportation (Medical): Not on file  . Lack of Transportation (Non-Medical): Not on file  Physical Activity:   . Days of Exercise per Week: Not on file  . Minutes of Exercise per Session: Not on file  Stress:   . Feeling of Stress : Not on file  Social Connections:   . Frequency of Communication with Friends and Family: Not on file  . Frequency of Social Gatherings with Friends and Family: Not on file  . Attends Religious Services: Not on file  . Active Member of Clubs or Organizations: Not on file  . Attends Archivist Meetings: Not on file  . Marital Status: Not on file     Family History: The patient's family history includes AAA (abdominal aortic aneurysm) in her paternal grandmother; Asthma in her mother and sister; Breast cancer in an other family member; Breast cancer (age of onset: 51) in her father; Cancer in an other family member; Cervical cancer in her maternal aunt; Colon cancer  (age of onset: 74) in her maternal grandmother; Colon cancer (age of onset: 83) in an other family member; Colon polyps in her maternal grandfather; Diabetes in her father; Heart disease in her father and paternal grandfather; Heart disease (age of onset: 66) in her paternal uncle; Hyperlipidemia in her brother and father; Hypertension in her brother, father, and mother; Liver cancer in her maternal grandmother; Melanoma (age of onset: 66) in her maternal grandfather; Stomach cancer (age of onset: 13) in an other family member; Stroke (age of onset: 18) in her paternal uncle; Vision loss in her brother. There is no history of Esophageal cancer, Pancreatic cancer, or Rectal cancer.  ROS:   Please see the history of present illness.    All other systems reviewed and are negative.  EKGs/Labs/Other Studies Reviewed:    The following studies were reviewed today: Echo report revealed normal ejection fraction, bicuspid aortic valve with mild regurgitation   Recent Labs: 05/07/2019: TSH 1.12 08/13/2019: ALT 27; BUN 13; Creatinine, Ser 0.84; Hemoglobin 14.0; Platelets 279.0; Potassium 4.0; Sodium 138  Recent Lipid Panel    Component Value Date/Time   CHOL 197 11/21/2016 1709   TRIG 316.0 (H) 11/21/2016 1709   HDL 37.90 (L) 11/21/2016 1709   CHOLHDL 5 11/21/2016 1709   VLDL 63.2 (H) 11/21/2016 1709   LDLDIRECT 126.0 11/21/2016 1709    Physical Exam:    VS:  BP 110/72   Pulse 79   Ht 5' 6"  (1.676 m)   Wt 276 lb (125.2 kg)   SpO2 96%   BMI 44.55 kg/m     Wt Readings from Last 3 Encounters:  09/24/19 276 lb (125.2 kg)  04/22/19 263 lb 4 oz (119.4 kg)  04/15/19 253 lb 3.2 oz (114.9 kg)     GEN: Patient is in no acute distress HEENT: Normal NECK: No JVD; No carotid bruits LYMPHATICS: No lymphadenopathy CARDIAC: Hear sounds regular, 2/6 systolic murmur at the apex. RESPIRATORY:  Clear to auscultation without rales, wheezing or rhonchi  ABDOMEN: Soft, non-tender,  non-distended MUSCULOSKELETAL:  No edema; No deformity  SKIN: Warm and dry NEUROLOGIC:  Alert and oriented x 3 PSYCHIATRIC:  Normal affect   Signed, Jenean Lindau, MD  09/24/2019 5:32 PM    [facility]

## 2019-09-24 NOTE — Patient Instructions (Signed)
Medication Instructions:  No medication changes *If you need a refill on your cardiac medications before your next appointment, please call your pharmacy*   Lab Work: None ordered If you have labs (blood work) drawn today and your tests are completely normal, you will receive your results only by: Marland Kitchen MyChart Message (if you have MyChart) OR . A paper copy in the mail If you have any lab test that is abnormal or we need to change your treatment, we will call you to review the results.   Testing/Procedures: None ordered   Follow-Up: At [facility], you and your health needs are our priority.  As part of our continuing mission to provide you with exceptional heart care, we have created designated Provider Care Teams.  These Care Teams include your primary Cardiologist (physician) and Advanced Practice Providers (APPs -  Physician Assistants and Nurse Practitioners) who all work together to provide you with the care you need, when you need it.  We recommend signing up for the patient portal called "MyChart".  Sign up information is provided on this After Visit Summary.  MyChart is used to connect with patients for Virtual Visits (Telemedicine).  Patients are able to view lab/test results, encounter notes, upcoming appointments, etc.  Non-urgent messages can be sent to your provider as well.   To learn more about what you can do with MyChart, go to [email or web address].    Your next appointment:   9 month(s)  The format for your next appointment:   In Person  Provider:   Jyl Heinz, MD   Other Instructions NA

## 2019-09-26 ENCOUNTER — Ambulatory Visit (INDEPENDENT_AMBULATORY_CARE_PROVIDER_SITE_OTHER): Payer: Managed Care, Other (non HMO)

## 2019-09-26 ENCOUNTER — Other Ambulatory Visit: Payer: Self-pay

## 2019-09-26 DIAGNOSIS — Z23 Encounter for immunization: Secondary | ICD-10-CM | POA: Diagnosis not present

## 2019-09-26 NOTE — Progress Notes (Signed)
Patient in for Gardisil #1 vaccine.   Per PCP order, Gardisil 9 administered IM in left deltoid, tolerated well.   Next dose scheduled for Nov 27, 2019.   Gerilyn Nestle, RN

## 2019-10-02 ENCOUNTER — Other Ambulatory Visit: Payer: Self-pay | Admitting: Family

## 2019-10-02 ENCOUNTER — Other Ambulatory Visit: Payer: Self-pay

## 2019-10-02 ENCOUNTER — Other Ambulatory Visit: Payer: Self-pay | Admitting: Family Medicine

## 2019-10-02 DIAGNOSIS — R059 Cough, unspecified: Secondary | ICD-10-CM

## 2019-10-02 DIAGNOSIS — I10 Essential (primary) hypertension: Secondary | ICD-10-CM

## 2019-10-02 DIAGNOSIS — R05 Cough: Secondary | ICD-10-CM

## 2019-10-02 DIAGNOSIS — E1165 Type 2 diabetes mellitus with hyperglycemia: Secondary | ICD-10-CM

## 2019-10-02 DIAGNOSIS — K580 Irritable bowel syndrome with diarrhea: Secondary | ICD-10-CM

## 2019-10-02 DIAGNOSIS — R0600 Dyspnea, unspecified: Secondary | ICD-10-CM

## 2019-10-03 ENCOUNTER — Telehealth: Payer: Self-pay | Admitting: Neurology

## 2019-10-03 MED ORDER — LOSARTAN POTASSIUM 100 MG PO TABS: 100.0000 mg | ORAL_TABLET | Freq: Every day | ORAL | 1 refills | Status: DC

## 2019-10-03 NOTE — Telephone Encounter (Signed)
Left message with the after hour service on 10-03-19 12:33 pm   Patient state that she needs auth on the Aimovig

## 2019-10-03 NOTE — Telephone Encounter (Signed)
Pt also states that the ubrelvy needs prior auth  The phone number that you need to call is [phone]

## 2019-10-04 ENCOUNTER — Encounter: Payer: Self-pay | Admitting: Neurology

## 2019-10-04 ENCOUNTER — Other Ambulatory Visit: Payer: Self-pay

## 2019-10-04 ENCOUNTER — Encounter: Payer: Self-pay | Admitting: Family

## 2019-10-04 ENCOUNTER — Other Ambulatory Visit: Payer: Self-pay | Admitting: Family

## 2019-10-04 ENCOUNTER — Ambulatory Visit (INDEPENDENT_AMBULATORY_CARE_PROVIDER_SITE_OTHER): Payer: Managed Care, Other (non HMO) | Admitting: Family

## 2019-10-04 VITALS — BP 139/78 | HR 95 | Temp 97.3°F | Resp 16 | Wt 277.0 lb

## 2019-10-04 DIAGNOSIS — L989 Disorder of the skin and subcutaneous tissue, unspecified: Secondary | ICD-10-CM | POA: Diagnosis not present

## 2019-10-04 NOTE — Progress Notes (Addendum)
Porschea Borys (Key: [id number]) Rx #: [phone] Ubrelvy 100MG tablets   Form [facility] Electronic PA Form [phone] NCPDP)  Patient Copay Assistance  Created 1 day ago Sent to Plan 1 hour ago Plan Response 1 hour ago Submit Clinical Questions 1 hour ago Determination Favorable 1 hour ago Message from [place];Review Type:Prior Auth;Coverage Start Date:10/04/2019;Coverage End Date:10/03/2020;

## 2019-10-04 NOTE — Progress Notes (Addendum)
   Subjective:    Patient ID: Stephanie Miles, female    DOB: 12-Oct-1980, 39 y.o.   MRN: [id number]  HPI  Patient is a 39 yr old female who presents today for mole removal near her left perineum. Reports that the lesion is starting to get irritated and she would like it removed.   Review of Systems     Objective:   Physical Exam Genitourinary:   Skin:    General: Skin is warm and dry.     Comments: Skin lesion approximately 2-3 mm in diameter at base of stalk  Neurological:     Mental Status: She is alert and oriented to person, place, and time.  Psychiatric:        Mood and Affect: Mood normal.           Assessment & Plan:  Skin lesion- Procedure including risks/benefits explained to patient.  Questions were answered. After informed consent was obtained and a time out completed, site was cleansed with betadine. 1% Lidocaine with epinephrine was injected under lesion and then shave biopsy was performed. Area was cauterized to obtain hemostasis.  Pt tolerated procedure well.  Specimen sent for pathology review.  Pt instructed to keep the area dry for 24 hours and to contact us if she develops redness, drainage or swelling at the site.  Pt may use tylenol as needed for discomfort today.

## 2019-10-04 NOTE — Progress Notes (Addendum)
[facility]phanie [facility] (Key: [id number]) Rx #: [id number] Aimovig 70MG/ML auto-injectors   Form [employer] Electronic PA Form [phone] NCPDP) Created 5 days ago Sent to Plan 4 days ago Plan Response 4 days ago Submit Clinical Questions 4 days ago Determination Favorable 13 hours ago Message from [place];Review Type:Prior Auth;Coverage Start Date:10/04/2019;Coverage End Date:04/04/2020;

## 2019-10-04 NOTE — Addendum Note (Signed)
Addended by: Jiles Prows on: 10/04/2019 02:36 PM   Modules accepted: Orders

## 2019-10-04 NOTE — Patient Instructions (Signed)
Excision of Skin Lesions, Care After This sheet gives you information about how to care for yourself after your procedure. Your health care provider may also give you more specific instructions. If you have problems or questions, contact your health care provider. What can I expect after the procedure? After your procedure, it is common to have pain or discomfort at the excision site. Follow these instructions at home: Excision care   Follow instructions from your health care provider about how to take care of your excision site. Make sure you: ? Wash your hands with soap and water before and after you change your bandage (dressing). If soap and water are not available, use hand sanitizer. ? Change your dressing as told by your health care provider. ? Leave stitches (sutures), skin glue, or adhesive strips in place. These skin closures may need to stay in place for 2 weeks or longer. If adhesive strip edges start to loosen and curl up, you may trim the loose edges. Do not remove adhesive strips completely unless your health care provider tells you to do that.  Check the excision area every day for signs of infection. Watch for: ? Redness, swelling, or pain. ? Fluid or blood. ? Warmth. ? Pus or a bad smell.  Keep the site clean, dry, and protected for at least 48 hours.  For bleeding, apply gentle but firm pressure to the area using a folded towel for 20 minutes.  Avoid high-impact exercise and activities until the sutures are removed or the area heals. General instructions  Take over-the-counter and prescription medicines only as told by your health care provider.  Follow instructions from your health care provider about how to minimize scarring. Scarring should lessen over time.  Avoid sun exposure until the area has healed. Use sunscreen to protect the area from the sun after it has healed.  Keep all follow-up visits as told by your health care provider. This is important. Contact  a health care provider if:  You have redness, swelling, or pain around your excision site.  You have fluid or blood coming from your excision site.  Your excision site feels warm to the touch.  You have pus or a bad smell coming from your excision site.  You have a fever.  You have pain that does not improve in 2-3 days after your procedure.  You notice skin irregularities or changes in how you feel (sensation). Summary  This sheet of instructions provides you with information about caring for yourself after your procedure. Contact your health care provider if you have any problems or questions.  Take over-the-counter and prescription medicines only as told by your health care provider.  Change your dressing as told by your health care provider.  Contact a health care provider if you have redness, swelling, pain, or other signs of infection around your excision site.  Keep all follow-up visits as told by your health care provider. This is important. This information is not intended to replace advice given to you by your health care provider. Make sure you discuss any questions you have with your health care provider. Document Revised: 01/17/2018 Document Reviewed: 01/17/2018 Elsevier Patient Education  [place].

## 2019-10-08 ENCOUNTER — Encounter: Payer: Self-pay | Admitting: Family

## 2019-10-11 ENCOUNTER — Encounter: Payer: Self-pay | Admitting: Family

## 2019-11-06 ENCOUNTER — Other Ambulatory Visit: Payer: Self-pay | Admitting: Family

## 2019-11-07 MED ORDER — ALPRAZOLAM 0.5 MG PO TABS: 0.5000 mg | ORAL_TABLET | Freq: Two times a day (BID) | ORAL | 0 refills | Status: DC | PRN

## 2019-11-07 NOTE — Telephone Encounter (Signed)
Requesting:xanax Contract: N/A UDS: N/A Last Visit:3.12.21 Next Visit:5.5.21 Last Refill:  Please Advise

## 2019-11-12 ENCOUNTER — Encounter: Payer: Self-pay | Admitting: Family

## 2019-11-27 ENCOUNTER — Other Ambulatory Visit: Payer: Self-pay

## 2019-11-27 ENCOUNTER — Ambulatory Visit (INDEPENDENT_AMBULATORY_CARE_PROVIDER_SITE_OTHER): Payer: Managed Care, Other (non HMO) | Admitting: *Deleted

## 2019-11-27 DIAGNOSIS — Z23 Encounter for immunization: Secondary | ICD-10-CM

## 2019-11-27 NOTE — Progress Notes (Signed)
Patient her for second gardasil vaccine per Melissa.  Vaccine given in left deltoid and patient tolerated well.

## 2019-12-04 ENCOUNTER — Other Ambulatory Visit: Payer: Self-pay | Admitting: Family

## 2019-12-04 ENCOUNTER — Other Ambulatory Visit: Payer: Self-pay | Admitting: Emergency Medicine

## 2019-12-04 DIAGNOSIS — Z1231 Encounter for screening mammogram for malignant neoplasm of breast: Secondary | ICD-10-CM

## 2019-12-10 ENCOUNTER — Other Ambulatory Visit: Payer: Self-pay | Admitting: Family

## 2019-12-10 DIAGNOSIS — E1165 Type 2 diabetes mellitus with hyperglycemia: Secondary | ICD-10-CM

## 2019-12-10 DIAGNOSIS — I10 Essential (primary) hypertension: Secondary | ICD-10-CM

## 2019-12-12 ENCOUNTER — Telehealth: Payer: Self-pay | Admitting: Neurology

## 2019-12-12 NOTE — Telephone Encounter (Signed)
Patient called in needing to move her appointment back, but wanted Dr. Tomi Likens to know her medication is working great. She has only had 1 major migraine since her second Aimovig shot. Her overall headache days are also reduced. She called Aimovig a "miracle drug".

## 2019-12-17 ENCOUNTER — Ambulatory Visit: Payer: Managed Care, Other (non HMO) | Admitting: Neurology

## 2019-12-27 ENCOUNTER — Encounter: Payer: Managed Care, Other (non HMO) | Admitting: Family

## 2020-01-03 ENCOUNTER — Emergency Department (HOSPITAL_BASED_OUTPATIENT_CLINIC_OR_DEPARTMENT_OTHER)
Admission: EM | Admit: 2020-01-03 | Discharge: 2020-01-04 | Disposition: A | Discharge status: Home / Self Care | Payer: Managed Care, Other (non HMO) | Attending: Emergency Medicine | Admitting: Emergency Medicine

## 2020-01-03 ENCOUNTER — Emergency Department (HOSPITAL_BASED_OUTPATIENT_CLINIC_OR_DEPARTMENT_OTHER): Payer: Managed Care, Other (non HMO)

## 2020-01-03 ENCOUNTER — Other Ambulatory Visit: Payer: Self-pay

## 2020-01-03 ENCOUNTER — Encounter (HOSPITAL_BASED_OUTPATIENT_CLINIC_OR_DEPARTMENT_OTHER): Payer: Self-pay

## 2020-01-03 DIAGNOSIS — Y929 Unspecified place or not applicable: Secondary | ICD-10-CM | POA: Diagnosis not present

## 2020-01-03 DIAGNOSIS — W540XXA Bitten by dog, initial encounter: Secondary | ICD-10-CM | POA: Insufficient documentation

## 2020-01-03 DIAGNOSIS — S61452A Open bite of left hand, initial encounter: Secondary | ICD-10-CM | POA: Diagnosis not present

## 2020-01-03 DIAGNOSIS — Z8505 Personal history of malignant neoplasm of liver: Secondary | ICD-10-CM | POA: Insufficient documentation

## 2020-01-03 DIAGNOSIS — Y999 Unspecified external cause status: Secondary | ICD-10-CM | POA: Diagnosis not present

## 2020-01-03 DIAGNOSIS — I129 Hypertensive chronic kidney disease with stage 1 through stage 4 chronic kidney disease, or unspecified chronic kidney disease: Secondary | ICD-10-CM | POA: Insufficient documentation

## 2020-01-03 DIAGNOSIS — Z7984 Long term (current) use of oral hypoglycemic drugs: Secondary | ICD-10-CM | POA: Diagnosis not present

## 2020-01-03 DIAGNOSIS — J45909 Unspecified asthma, uncomplicated: Secondary | ICD-10-CM | POA: Insufficient documentation

## 2020-01-03 DIAGNOSIS — Z87891 Personal history of nicotine dependence: Secondary | ICD-10-CM | POA: Insufficient documentation

## 2020-01-03 DIAGNOSIS — N189 Chronic kidney disease, unspecified: Secondary | ICD-10-CM | POA: Diagnosis not present

## 2020-01-03 DIAGNOSIS — Y939 Activity, unspecified: Secondary | ICD-10-CM | POA: Insufficient documentation

## 2020-01-03 DIAGNOSIS — Z79899 Other long term (current) drug therapy: Secondary | ICD-10-CM | POA: Diagnosis not present

## 2020-01-03 DIAGNOSIS — S61451A Open bite of right hand, initial encounter: Secondary | ICD-10-CM | POA: Diagnosis present

## 2020-01-03 DIAGNOSIS — E1122 Type 2 diabetes mellitus with diabetic chronic kidney disease: Secondary | ICD-10-CM | POA: Insufficient documentation

## 2020-01-03 MED ORDER — LIDOCAINE-EPINEPHRINE (PF) 2 %-1:200000 IJ SOLN
10.0000 mL | Freq: Once | INTRAMUSCULAR | Status: AC
  Administered 2020-01-03: 10 mL via INTRADERMAL
  Filled 2020-01-03: qty 10

## 2020-01-03 MED ORDER — AMOXICILLIN-POT CLAVULANATE 875-125 MG PO TABS
1.0000 | ORAL_TABLET | Freq: Two times a day (BID) | ORAL | 0 refills | Status: DC
Start: 2020-01-03 — End: 2020-04-27

## 2020-01-03 MED ORDER — ACETAMINOPHEN 500 MG PO TABS
1000.0000 mg | ORAL_TABLET | Freq: Once | ORAL | Status: AC
  Administered 2020-01-03: 1000 mg via ORAL
  Filled 2020-01-03: qty 2

## 2020-01-03 MED ORDER — AMOXICILLIN-POT CLAVULANATE 875-125 MG PO TABS
1.0000 | ORAL_TABLET | Freq: Two times a day (BID) | ORAL | 0 refills | Status: DC
Start: 2020-01-03 — End: 2020-01-06

## 2020-01-03 MED ORDER — ONDANSETRON 4 MG PO TBDP
4.0000 mg | ORAL_TABLET | Freq: Once | ORAL | Status: AC
  Administered 2020-01-03: 4 mg via ORAL
  Filled 2020-01-03: qty 1

## 2020-01-03 MED ORDER — AMOXICILLIN-POT CLAVULANATE 875-125 MG PO TABS
1.0000 | ORAL_TABLET | Freq: Once | ORAL | Status: AC
  Administered 2020-01-03: 1 via ORAL
  Filled 2020-01-03: qty 1

## 2020-01-03 MED ORDER — OXYCODONE HCL 5 MG PO TABS
5.0000 mg | ORAL_TABLET | Freq: Once | ORAL | Status: AC
  Administered 2020-01-03: 5 mg via ORAL
  Filled 2020-01-03: qty 1

## 2020-01-03 MED ORDER — MORPHINE SULFATE 15 MG PO TABS: 7.5000 mg | ORAL_TABLET | ORAL | 0 refills | Status: DC | PRN

## 2020-01-03 NOTE — ED Notes (Signed)
Patient transported to X-ray 

## 2020-01-03 NOTE — ED Provider Notes (Addendum)
[facility] Provider Note   CSN: [id number] Arrival date & time: 01/03/20  2112     History Chief Complaint  Patient presents with  . Animal Bite    Stephanie Miles is a 39 y.o. female.  60 yo F with a chief complaints of bilateral hand pain.  She was bit by a dog on both hands.  She was attempting to put a harness on it.  States that it clamped down on her right hand and she jerked away and then clamped on the left hand and then stopped and went back to its owner.  Her tetanus is up-to-date.  Has a wound to the right hand.  Complaining of severe left hand pain.  Denies other area of injury.  The animal belongs to a friend of hers.  Vaccines are up-to-date.  The history is provided by the patient.  Animal Bite Contact animal:  Dog Location:  Hand Hand injury location:  L hand and R hand Time since incident:  2 hours Pain details:    Quality:  Aching   Severity:  Severe   Progression:  Worsening Incident location:  Outside Provoked: unprovoked   Notifications:  None Animal's rabies vaccination status:  Up to date Animal in possession: yes   Tetanus status:  Up to date Relieved by:  Nothing Worsened by:  Activity Ineffective treatments:  None tried Associated symptoms: no fever        Past Medical History:  Diagnosis Date  . Allergy   . Anxiety   . Aortic regurgitation due to bicuspid aortic valve   . Arthritis   . Borderline diabetes 10/20/2016  . Chronic kidney disease    kidney stones  . Depression   . Diabetes type 2, controlled ([facility]) 10/20/2016  . Family history of breast cancer in female   . Family history of colon cancer   . Fatty liver 11/28/2016  . GERD (gastroesophageal reflux disease)   . Heart murmur   . Hypertension   . IBS (irritable bowel syndrome)   . Insomnia   . Liver cancer ([place]) 03/2017  . Migraine headache   . NASH (nonalcoholic steatohepatitis) 04/04/2017   Per biopsy 9/18  . OCD (obsessive compulsive  disorder)   . Orbital cellulitis    at age 26 was hospitalized for 3 months (almost died)   . OSA (obstructive sleep apnea)   . S/P appy 05/2013  . Sleep apnea     Patient Active Problem List   Diagnosis Date Noted  . Fibromyalgia 04/04/2019  . 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 Tinnitus of both ears 10/11/2018  . Eustachian tube dysfunction, bilateral 10/10/2018  . Hypertriglyceridemia 04/05/2018  . Right shoulder injury, subsequent encounter 02/01/2018  . Left leg injury, initial encounter 02/01/2018  . Hepatocellular carcinoma ([place]) 01/31/2018  . Vitamin D deficiency 11/01/2017  . Black stools 09/13/2017  . Liver lesion 06/20/2017  . Diarrhea 06/20/2017  . NASH (nonalcoholic steatohepatitis) 04/04/2017  . Iron deficiency anemia due to chronic blood loss 03/21/2017  . Genetic testing 02/28/2017  . Family history of breast cancer in female   . Family history of colon cancer   . Elevated LFTs 01/11/2017  . RUQ abdominal pain 01/11/2017  . PTSD (post-traumatic stress disorder) 12/28/2016  . Primary osteoarthritis of both knees 12/28/2016  . ANA positive 12/28/2016  . Leukocytosis 12/06/2016  . Fatty liver 11/28/2016  . History of kidney stones 10/21/2016  . Osteoarthritis 10/21/2016  .  Diabetes type 2, controlled ([facility]phan[facility]) 10/20/2016  . Cough variant asthma vs uacs  07/07/2015  . Severe obesity (BMI >= 40) ([place]) 07/07/2015  . Dyspnea 07/07/2015  . Asthma 05/27/2015  . Aortic regurgitation due to bicuspid aortic valve 03/12/2014  . Personal history of tobacco use, presenting hazards to health 01/20/2014  . Anxiety state 10/16/2013  . Migraine 07/08/2013  . Sleep apnea 07/08/2013  . IBS (irritable bowel syndrome) 06/06/2013  . GERD (gastroesophageal reflux disease) 06/06/2013  . HTN (hypertension) 06/06/2013    Past Surgical History:  Procedure Laterality Date  . COLONOSCOPY    . DENTAL SURGERY     wisdom teeth  .  ESOPHAGOGASTRODUODENOSCOPY    . LAPAROSCOPIC APPENDECTOMY N/A 06/06/2013   Procedure: APPENDECTOMY LAPAROSCOPIC;  Surgeon: Imogene Burn. Georgette Dover, MD;  Location: [facility];  Service: General;  Laterality: N/A;  . LIVER BIOPSY       OB History   No obstetric history on file.     Family History  Problem Relation Age of Onset  . Asthma Mother   . Hypertension Mother   . Breast cancer Father 15  . Diabetes Father   . Hypertension Father   . Heart disease Father   . Hyperlipidemia Father   . Asthma Sister   . Hyperlipidemia Brother   . Hypertension Brother   . Vision loss Brother   . Colon cancer Maternal Grandmother 9  . Liver cancer Maternal Grandmother   . Cervical cancer Maternal Aunt   . Stroke Paternal Uncle 15  . Melanoma Maternal Grandfather 17  . Colon polyps Maternal Grandfather   . AAA (abdominal aortic aneurysm) Paternal Grandmother   . Heart disease Paternal Grandfather   . Heart disease Paternal Uncle 68  . Colon cancer Other 30       MGM's mother  . Stomach cancer Other 73       MGM's mother  . Breast cancer Other        MGM's maternal aunt  . Cancer Other        MGM's maternal uncle with GI cancer  . Esophageal cancer Neg Hx   . Pancreatic cancer Neg Hx   . Rectal cancer Neg Hx     Social History   Tobacco Use  . Smoking status: Former Smoker    Packs/day: 1.00    Years: 20.00    Pack years: 20.00    Types: Cigarettes    Quit date: 02/22/2013    Years since quitting: 6.8  . Smokeless tobacco: Never Used  Vaping Use  . Vaping Use: Never used  Substance Use Topics  . Alcohol use: Yes    Alcohol/week: 0.0 standard drinks    Comment: occ  . Drug use: No    Home Medications Prior to Admission medications   Medication Sig Start Date End Date Taking? Authorizing Provider  ACCU-CHEK FASTCLIX LANCETS MISC Check sugar twice daily 06/20/18   Debbrah Alar, NP  ACCU-CHEK GUIDE test strip USE TO CHECK BLOOD SUGAR TWO TIMES A DAY 12/26/18   Debbrah Alar, NP  acetaminophen (TYLENOL) 500 MG tablet Take 500-1,000 mg by mouth every 6 (six) hours as needed (for pain).     [provider]  albuterol (VENTOLIN HFA) 108 (90 Base) MCG/ACT inhaler Inhale 2 puffs into the lungs every 6 (six) hours as needed for wheezing or shortness of breath.    [provider]  ALPRAZolam Duanne Moron) 0.5 MG tablet TAKE ONE TABLET BY MOUTH TWICE A DAY AS NEEDED 12/11/19  Debbrah Alar, NP  amoxicillin-clavulanate (AUGMENTIN) 875-125 MG tablet Take 1 tablet by mouth every 12 (twelve) hours. 01/03/20   Deno Etienne, DO  amoxicillin-clavulanate (AUGMENTIN) 875-125 MG tablet Take 1 tablet by mouth every 12 (twelve) hours. 01/03/20   Deno Etienne, DO  Blood Glucose Monitoring Suppl (ACCU-CHEK GUIDE) w/Device KIT 1 each by Does not apply route daily. 01/09/17   Debbrah Alar, NP  budesonide-formoterol (SYMBICORT) 160-4.5 MCG/ACT inhaler INHALE TWO PUFFS BY MOUTH TWICE A DAY 02/03/19   Debbrah Alar, NP  buPROPion (WELLBUTRIN XL) 150 MG 24 hr tablet TAKE ONE TABLET BY MOUTH DAILY 10/03/19   Debbrah Alar, NP  cetirizine (ZYRTEC) 10 MG tablet Take 10 mg by mouth daily.    [provider]  Cholecalciferol (VITAMIN D3) 50 MCG (2000 UT) TABS Take 2,000 Units by mouth daily.    [provider]  clotrimazole-betamethasone (LOTRISONE) cream Apply 1 application topically 2 (two) times daily. 02/26/19   Debbrah Alar, NP  dicyclomine (BENTYL) 20 MG tablet TAKE ONE TABLET BY MOUTH FOUR TIMES A DAY BEFORE MEALS AND AT BEDTIME 10/03/19   Debbrah Alar, NP  DOXORUBICIN HCL IV Inject into the vein See admin instructions.     [provider]  DULoxetine (CYMBALTA) 60 MG capsule TAKE ONE CAPSULE BY MOUTH DAILY 10/03/19   Debbrah Alar, NP  Erenumab-aooe (AIMOVIG) 70 MG/ML SOAJ Inject 70 mg into the skin every 30 (thirty) days. 08/08/19   Pieter Partridge, DO  fluconazole (DIFLUCAN) 150 MG tablet Take 1 tablet by mouth today,  repeat in 1 week. 04/15/19   Debbrah Alar, NP  fluticasone (FLONASE) 50 MCG/ACT nasal spray SPRAY TWO SPRAYS IN EACH NOSTRIL ONCE DAILY 12/11/19   Debbrah Alar, NP  furosemide (LASIX) 40 MG tablet TAKE ONE TABLET BY MOUTH EVERY MORNING 12/11/19   Debbrah Alar, NP  Lactulose 20 GM/30ML SOLN 15 ML by mouth twice daily.  May titrate as needed to 60m three times daily with goal of 2-3 soft BM's/day 05/09/19   ODebbrah Alar NP  losartan (COZAAR) 100 MG tablet Take 1 tablet (100 mg total) by mouth daily. 10/03/19   ODebbrah Alar NP  losartan (COZAAR) 100 MG tablet Take 1 tablet (100 mg total) by mouth daily. 10/03/19   ODebbrah Alar NP  metFORMIN (GLUCOPHAGE-XR) 500 MG 24 hr tablet TAKE TWO TABLETS BY MOUTH DAILY WITH BREAKFAST 12/11/19   ODebbrah Alar NP  metoprolol succinate (TOPROL-XL) 100 MG 24 hr tablet TAKE ONE TABLET BY MOUTH DAILY WITH OR IMMEDIATELY FOLLOWING A MEAL AS DIRECTED 12/11/19   ODebbrah Alar NP  montelukast (SINGULAIR) 10 MG tablet TAKE ONE TABLET BY MOUTH EVERY NIGHT AT BEDTIME 10/03/19   ODebbrah Alar NP  morphine (MSIR) 15 MG tablet Take 0.5 tablets (7.5 mg total) by mouth every 4 (four) hours as needed for severe pain. 01/03/20   FDeno Etienne DO  Multiple Vitamins-Minerals (MULTIVITAMIN WITH MINERALS) tablet Take 1 tablet by mouth daily.    [provider]  nystatin (NYSTATIN) powder Apply topically 2 (two) times daily. 11/23/18   ODebbrah Alar NP  omeprazole (PRILOSEC) 20 MG capsule TAKE TWO CAPSULES BY MOUTH EVERY MORNING BEFORE BREAKFAST AND 1 CAPSULE EVERY EVENING BEFORE SUPPER 08/24/19   ODebbrah Alar NP  ondansetron (ZOFRAN) 4 MG tablet Take 1 tablet (4 mg total) by mouth every 8 (eight) hours as needed for nausea or vomiting. 06/18/19   ODebbrah Alar NP  potassium chloride (KLOR-CON) 10 MEQ tablet TAKE ONE TABLET BY MOUTH EVERY OTHER DAY 11/07/19  Debbrah Alar, NP  Prenatal Vit-Fe  Fumarate-FA (MULTIVITAMIN-PRENATAL) 27-0.8 MG TABS tablet Take 1 tablet by mouth daily.     [provider]  Ubrogepant (UBRELVY) 100 MG TABS Take 1 tablet by mouth as needed (May repeat in 2 hours.  Maximum 2 tablets in 24 hours). 08/08/19   Pieter Partridge, DO    Allergies    Dust mite extract, Mold extract [trichophyton], Nsaids, Codeine, Amlodipine, and Tape  Review of Systems   Review of Systems  Constitutional: Negative for chills and fever.  HENT: Negative for congestion and rhinorrhea.   Eyes: Negative for redness and visual disturbance.  Respiratory: Negative for shortness of breath and wheezing.   Cardiovascular: Negative for chest pain and palpitations.  Gastrointestinal: Negative for nausea and vomiting.  Genitourinary: Negative for dysuria and urgency.  Musculoskeletal: Positive for arthralgias. Negative for myalgias.  Skin: Negative for pallor and wound.  Neurological: Negative for dizziness and headaches.    Physical Exam Updated Vital Signs BP (!) 145/115 (BP Location: Right Arm)   Pulse (!) 112   Temp 99 F (37.2 C) (Oral)   Resp 18   Ht _0  (1.676 m)   Wt 124.7 kg   LMP 01/01/2020   SpO2 95%   BMI 44.39 kg/m   Physical Exam Vitals and nursing note reviewed.  Constitutional:      General: She is not in acute distress.    Appearance: She is well-developed. She is not diaphoretic.  HENT:     Head: Normocephalic and atraumatic.  Eyes:     Pupils: Pupils are equal, round, and reactive to light.  Cardiovascular:     Rate and Rhythm: Normal rate and regular rhythm.     Heart sounds: No murmur heard.  No friction rub. No gallop.   Pulmonary:     Effort: Pulmonary effort is normal.     Breath sounds: No wheezing or rales.  Abdominal:     General: There is no distension.     Palpations: Abdomen is soft.     Tenderness: There is no abdominal tenderness.  Musculoskeletal:        General: Tenderness present.     Cervical back: Normal range of  motion and neck supple.     Comments: 2 puncture wounds at the webspace of the right hand.  On the dorsal aspect.  Tenderness along the second metacarpal.  No pain at the wrist.  Forage motion of the wrist.  Significant edema to the left second metacarpal.  Tenderness diffusely about the hand.  Small abrasions between the digits.  Skin:    General: Skin is warm and dry.  Neurological:     Mental Status: She is alert and oriented to person, place, and time.  Psychiatric:        Behavior: Behavior normal.     ED Results / Procedures / Treatments   Labs (all labs ordered are listed, but only abnormal results are displayed) Labs Reviewed - No data to display  EKG None  Radiology DG Hand Complete Left  Result Date: 01/03/2020 CLINICAL DATA:  Recent dog bites EXAM: LEFT HAND - COMPLETE 3+ VIEW COMPARISON:  None. FINDINGS: Considerable soft tissue swelling is noted consistent with the recent injury. No acute fracture or dislocation is noted. No radiopaque foreign body is seen. IMPRESSION: Soft tissue swelling without acute bony abnormality. Electronically Signed   By: Inez Catalina M.D.   On: 01/03/2020 22:16   DG Hand Complete Right  Result Date: 01/03/2020  CLINICAL DATA:  Several dog bites with hand pain, initial encounter EXAM: RIGHT HAND - COMPLETE 3+ VIEW COMPARISON:  None. FINDINGS: There is no evidence of fracture or dislocation. There is no evidence of arthropathy or other focal bone abnormality. Soft tissues are unremarkable. IMPRESSION: No acute abnormality noted. Electronically Signed   By: Inez Catalina M.D.   On: 01/03/2020 22:18    Procedures Procedures (including critical care time)  Medications Ordered in ED Medications  amoxicillin-clavulanate (AUGMENTIN) 875-125 MG per tablet 1 tablet (has no administration in time range)  lidocaine-EPINEPHrine (XYLOCAINE W/EPI) 2 %-1:200000 (PF) injection 10 mL (10 mLs Intradermal Given by Other 01/03/20 2156)  acetaminophen (TYLENOL)  tablet 1,000 mg (1,000 mg Oral Given 01/03/20 2208)  oxyCODONE (Oxy IR/ROXICODONE) immediate release tablet 5 mg (5 mg Oral Given 01/03/20 2208)  ondansetron (ZOFRAN-ODT) disintegrating tablet 4 mg (4 mg Oral Given 01/03/20 2227)    ED Course  I have reviewed the triage vital signs and the nursing notes.  Pertinent labs & imaging results that were available during my care of the patient were reviewed by me and considered in my medical decision making (see chart for details).    MDM Rules/Calculators/A&P                          39 yo F with a chief complaint of a dog bite.  The patient was attempting to place a harness on a dog and it bit both of her hands.  She denies other area of injury.  States her tetanus is up-to-date.  The animal is in possession of one of her friends and is up-to-date on its vaccines.   Plain film viewed by me without fracture.  Called back to the patients room as she is now complaining more about here left wrist. Having some snuffbox tenderness will xray.  Place in removable splint.  PCP follow up.   10:42 PM:  I have discussed the diagnosis/risks/treatment options with the patient and believe the pt to be eligible for discharge home to follow-up with PCP. We also discussed returning to the ED immediately if new or worsening sx occur. We discussed the sx which are most concerning (e.g., sudden worsening pain, fever, inability to tolerate by mouth) that necessitate immediate return. Medications administered to the patient during their visit and any new prescriptions provided to the patient are listed below.  Medications given during this visit Medications  amoxicillin-clavulanate (AUGMENTIN) 875-125 MG per tablet 1 tablet (has no administration in time range)  lidocaine-EPINEPHrine (XYLOCAINE W/EPI) 2 %-1:200000 (PF) injection 10 mL (10 mLs Intradermal Given by Other 01/03/20 2156)  acetaminophen (TYLENOL) tablet 1,000 mg (1,000 mg Oral Given 01/03/20 2208)  oxyCODONE  (Oxy IR/ROXICODONE) immediate release tablet 5 mg (5 mg Oral Given 01/03/20 2208)  ondansetron (ZOFRAN-ODT) disintegrating tablet 4 mg (4 mg Oral Given 01/03/20 2227)     The patient appears reasonably screen and/or stabilized for discharge and I doubt any other medical condition or other [facility] requiring further screening, evaluation, or treatment in the ED at this time prior to discharge.   Final Clinical Impression(s) / ED Diagnoses Final diagnoses:  Dog bite, initial encounter    Rx / DC Orders ED Discharge Orders         Ordered    amoxicillin-clavulanate (AUGMENTIN) 875-125 MG tablet  Every 12 hours     Discontinue  Reprint     01/03/20 2224    morphine (MSIR) 15 MG tablet  Every 4 hours PRN     Discontinue  Reprint     01/03/20 2226    amoxicillin-clavulanate (AUGMENTIN) 875-125 MG tablet  Every 12 hours     Discontinue  Reprint     01/03/20 [place], Gina Costilla, DO 01/03/20 [place], [place], DO 01/03/20 2349

## 2020-01-03 NOTE — ED Triage Notes (Signed)
Pt was bite on bilateral hands by a 70 lbs dog. Pt has pucture wounds on both hands and a large hematoma to L hand. Dog is reportedly up to date on vaccines.

## 2020-01-03 NOTE — Discharge Instructions (Signed)
Take 4 over the counter ibuprofen tablets 3 times a day or 2 over-the-counter naproxen tablets twice a day for pain. Also take tylenol 1084m(2 extra strength) four times a day.   Then take the pain medicine if you feel like you need it. Narcotics do not help with the pain, they only make you care about it less.  You can become addicted to this, people may break into your house to steal it.  It will constipate you.  If you drive under the influence of this medicine you can get a DUI.

## 2020-01-06 ENCOUNTER — Ambulatory Visit (INDEPENDENT_AMBULATORY_CARE_PROVIDER_SITE_OTHER): Payer: Managed Care, Other (non HMO) | Admitting: Family

## 2020-01-06 ENCOUNTER — Other Ambulatory Visit: Payer: Self-pay

## 2020-01-06 ENCOUNTER — Encounter: Payer: Self-pay | Admitting: Family

## 2020-01-06 VITALS — BP 128/70 | HR 77 | Temp 97.7°F | Resp 19 | Ht 66.0 in | Wt 282.0 lb

## 2020-01-06 DIAGNOSIS — S41159A Open bite of unspecified upper arm, initial encounter: Secondary | ICD-10-CM | POA: Diagnosis not present

## 2020-01-06 DIAGNOSIS — L709 Acne, unspecified: Secondary | ICD-10-CM | POA: Diagnosis not present

## 2020-01-06 DIAGNOSIS — Z0000 Encounter for general adult medical examination without abnormal findings: Secondary | ICD-10-CM

## 2020-01-06 DIAGNOSIS — W540XXA Bitten by dog, initial encounter: Secondary | ICD-10-CM

## 2020-01-06 LAB — COMPREHENSIVE METABOLIC PANEL
ALT: 37 U/L — ABNORMAL HIGH (ref 0–35)
AST: 30 U/L (ref 0–37)
Albumin: 3.8 g/dL (ref 3.5–5.2)
Alkaline Phosphatase: 93 U/L (ref 39–117)
BUN: 12 mg/dL (ref 6–23)
CO2: 30 mEq/L (ref 19–32)
Calcium: 9 mg/dL (ref 8.4–10.5)
Chloride: 101 mEq/L (ref 96–112)
Creatinine, Ser: 0.88 mg/dL (ref 0.40–1.20)
GFR: 70.79 mL/min (ref >60.00)
Glucose, Bld: 151 mg/dL — ABNORMAL HIGH (ref 70–99)
Potassium: 3.8 mEq/L (ref 3.5–5.1)
Sodium: 139 mEq/L (ref 135–145)
Total Bilirubin: 0.3 mg/dL (ref 0.2–1.2)
Total Protein: 6.2 g/dL (ref 6.0–8.3)

## 2020-01-06 LAB — LIPID PANEL
Cholesterol: 219 mg/dL — ABNORMAL HIGH (ref 0–200)
HDL: 33.1 mg/dL — ABNORMAL LOW (ref >39.00)
NonHDL: 185.95
Total CHOL/HDL Ratio: 7
Triglycerides: 295 mg/dL — ABNORMAL HIGH (ref 0.0–149.0)
VLDL: 59 mg/dL — ABNORMAL HIGH (ref 0.0–40.0)

## 2020-01-06 LAB — LDL CHOLESTEROL, DIRECT: Direct LDL: 150 mg/dL

## 2020-01-06 LAB — TSH: TSH: 1.51 u[IU]/mL (ref 0.35–4.50)

## 2020-01-06 MED ORDER — BENZOYL PEROXIDE-ERYTHROMYCIN 5-3 % EX GEL
Freq: Two times a day (BID) | CUTANEOUS | 5 refills | Status: DC
Start: 2020-01-06 — End: 2020-12-01

## 2020-01-06 NOTE — Patient Instructions (Signed)
Please complete lab work prior to leaving.   

## 2020-01-06 NOTE — Progress Notes (Signed)
Subjective:    Patient ID: Stephanie Miles, female    DOB: [birth year], 39 y.o.   MRN: [id number]  HPI  Patient presents today for complete physical.  Immunizations: tda, pneumovax, pfizer up to date Diet:  Reports that she is workign 50-55 hrs/week, doesn't eat until dinner, has a red bull or two in the day. Eats a healthy dinner. Sometimes will snack after dinner Wt Readings from Last 3 Encounters:  01/06/20 282 lb (127.9 kg)  01/03/20 275 lb (124.7 kg)  10/04/19 277 lb (125.6 kg)  Exercise: plans to go back to the GYM, walks 20 min in AM with her dog, plays outside in the evenings Pap Smear:10/11/18 Mammogram: scheduled-01/24/2020  Wound check- had a dog bite on 01/03/20 (friend's dog who is up-to-date on vaccinations).  She was seen in the emergency department on 01/03/2020 for the these wounds.  She was placed on Augmentin.  She continues to keep the wounds clean and dry.  She notes significant improvement in the swelling.  Acne-she notes that prior to her menstrual cycle her acne has been worse in recent months.  She has been using over-the-counter acne products.  Hx of liver masses- Only being followed by the hepatologist at [facility], oncology signed off.   Review of Systems  Constitutional: Positive for unexpected weight change.  HENT: Negative for rhinorrhea.   Eyes: Negative for visual disturbance.  Respiratory: Negative for cough.   Cardiovascular: Negative for chest pain.  Gastrointestinal: Positive for diarrhea (occasional diarrhea- notes that aimovig seems to be helping). Negative for constipation.  Genitourinary: Negative for dysuria and frequency.  Musculoskeletal: Positive for arthralgias (chronic at baseline) and myalgias (chronic at baseline).  Skin:       Dog bites- healing  Neurological: Positive for headaches (improved on current migaine regimen, about 2-3 times a month).  Hematological: Negative for adenopathy.  Psychiatric/Behavioral:       Reports  depression is well controlled   Past Medical History:  Diagnosis Date  . Allergy   . Anxiety   . Aortic regurgitation due to bicuspid aortic valve   . Arthritis   . Borderline diabetes 10/20/2016  . Chronic kidney disease    kidney stones  . Depression   . Diabetes type 2, controlled ([place]) 10/20/2016  . Family history of breast cancer in female   . Family history of colon cancer   . Fatty liver 11/28/2016  . GERD (gastroesophageal reflux disease)   . Heart murmur   . Hypertension   . IBS (irritable bowel syndrome)   . Insomnia   . Liver cancer ([place]) 03/2017  . Migraine headache   . NASH (nonalcoholic steatohepatitis) 04/04/2017   Per biopsy 9/18  . OCD (obsessive compulsive disorder)   . Orbital cellulitis    at age 20 was hospitalized for 3 months (almost died)   . OSA (obstructive sleep apnea)   . S/P appy 05/2013  . Sleep apnea      Social History   Socioeconomic History  . Marital status: Single    Spouse name: Not on file  . Number of children: 0  . Years of education: Not on file  . Highest education level: Not on file  Occupational History  . Occupation: Ship broker  . Occupation: Programmer, applications  Tobacco Use  . Smoking status: Former Smoker    Packs/day: 1.00    Years: 20.00    Pack years: 20.00    Types: Cigarettes    Quit date: 02/22/2013  Years since quitting: 6.8  . Smokeless tobacco: Never Used  Vaping Use  . Vaping Use: Never used  Substance and Sexual Activity  . Alcohol use: Yes    Alcohol/week: 0.0 standard drinks    Comment: occ  . Drug use: No  . Sexual activity: Not Currently  Other Topics Concern  . Not on file  Social History Narrative   Working at [employer] doing [employer] alone (has a Neurosurgeon)   Working on a degree at [employer], will plan to return fall 2018   Enjoys spending time with family.   Friends and family in [place] Determinants of Health   Financial Resource Strain:   . Difficulty of Paying Living  Expenses:   Food Insecurity:   . Worried About Charity fundraiser in the Last Year:   . Arboriculturist in the Last Year:   Transportation Needs:   . Film/video editor (Medical):   Marland Kitchen Lack of Transportation (Non-Medical):   Physical Activity:   . Days of Exercise per Week:   . Minutes of Exercise per Session:   Stress:   . Feeling of Stress :   Social Connections:   . Frequency of Communication with Friends and Family:   . Frequency of Social Gatherings with Friends and Family:   . Attends Religious Services:   . Active Member of Clubs or Organizations:   . Attends Archivist Meetings:   Marland Kitchen Marital Status:   Intimate Partner Violence:   . Fear of Current or Ex-Partner:   . Emotionally Abused:   Marland Kitchen Physically Abused:   . Sexually Abused:     Past Surgical History:  Procedure Laterality Date  . COLONOSCOPY    . DENTAL SURGERY     wisdom teeth  . ESOPHAGOGASTRODUODENOSCOPY    . LAPAROSCOPIC APPENDECTOMY N/A 06/06/2013   Procedure: APPENDECTOMY LAPAROSCOPIC;  Surgeon: Imogene Burn. Georgette Dover, MD;  Location: [facility];  Service: General;  Laterality: N/A;  . LIVER BIOPSY      Family History  Problem Relation Age of Onset  . Asthma Mother   . Hypertension Mother   . Breast cancer Father 83  . Diabetes Father   . Hypertension Father   . Heart disease Father   . Hyperlipidemia Father   . Asthma Sister   . Hyperlipidemia Brother   . Hypertension Brother   . Vision loss Brother   . Colon cancer Maternal Grandmother 68  . Liver cancer Maternal Grandmother   . Cervical cancer Maternal Aunt   . Stroke Paternal Uncle 67  . Melanoma Maternal Grandfather 15  . Colon polyps Maternal Grandfather   . AAA (abdominal aortic aneurysm) Paternal Grandmother   . Heart disease Paternal Grandfather   . Heart disease Paternal Uncle 7  . Colon cancer Other 65       MGM's mother  . Stomach cancer Other 21       MGM's mother  . Breast cancer Other        MGM's maternal aunt  .  Cancer Other        MGM's maternal uncle with GI cancer  . Esophageal cancer Neg Hx   . Pancreatic cancer Neg Hx   . Rectal cancer Neg Hx     Allergies  Allergen Reactions  . Dust Mite Extract Shortness Of Breath and Cough  . Mold Extract [Trichophyton] Shortness Of Breath and Cough  . Nsaids Other (See Comments)  Patient is to NOT have NSAIDS  . Codeine Nausea Only    Required use of an antiemetic   . Amlodipine Swelling and Other (See Comments)    Edema   . Tape Rash    Medical tape and Band-Aids aren't tolerated    Current Outpatient Medications on File Prior to Visit  Medication Sig Dispense Refill  . ACCU-CHEK FASTCLIX LANCETS MISC Check sugar twice daily 102 each 3  . ACCU-CHEK GUIDE test strip USE TO CHECK BLOOD SUGAR TWO TIMES A DAY 100 each 0  . acetaminophen (TYLENOL) 500 MG tablet Take 500-1,000 mg by mouth every 6 (six) hours as needed (for pain).     Marland Kitchen albuterol (VENTOLIN HFA) 108 (90 Base) MCG/ACT inhaler Inhale 2 puffs into the lungs every 6 (six) hours as needed for wheezing or shortness of breath.    . ALPRAZolam (XANAX) 0.5 MG tablet TAKE ONE TABLET BY MOUTH TWICE A DAY AS NEEDED 45 tablet 0  . amoxicillin-clavulanate (AUGMENTIN) 875-125 MG tablet Take 1 tablet by mouth every 12 (twelve) hours. 14 tablet 0  . Blood Glucose Monitoring Suppl (ACCU-CHEK GUIDE) w/Device KIT 1 each by Does not apply route daily. 1 kit 0  . budesonide-formoterol (SYMBICORT) 160-4.5 MCG/ACT inhaler INHALE TWO PUFFS BY MOUTH TWICE A DAY 10.2 g 5  . buPROPion (WELLBUTRIN XL) 150 MG 24 hr tablet TAKE ONE TABLET BY MOUTH DAILY 90 tablet 0  . cetirizine (ZYRTEC) 10 MG tablet Take 10 mg by mouth daily.    . Cholecalciferol (VITAMIN D3) 50 MCG (2000 UT) TABS Take 2,000 Units by mouth daily.    . clotrimazole-betamethasone (LOTRISONE) cream Apply 1 application topically 2 (two) times daily. 45 g 1  . dicyclomine (BENTYL) 20 MG tablet TAKE ONE TABLET BY MOUTH FOUR TIMES A DAY BEFORE MEALS AND  AT BEDTIME 80 tablet 2  . DOXORUBICIN HCL IV Inject into the vein See admin instructions.     . DULoxetine (CYMBALTA) 60 MG capsule TAKE ONE CAPSULE BY MOUTH DAILY 90 capsule 4  . Erenumab-aooe (AIMOVIG) 70 MG/ML SOAJ Inject 70 mg into the skin every 30 (thirty) days. 1 pen 11  . fluconazole (DIFLUCAN) 150 MG tablet Take 1 tablet by mouth today, repeat in 1 week. 2 tablet 0  . fluticasone (FLONASE) 50 MCG/ACT nasal spray SPRAY TWO SPRAYS IN EACH NOSTRIL ONCE DAILY 9.9 mL 2  . furosemide (LASIX) 40 MG tablet TAKE ONE TABLET BY MOUTH EVERY MORNING 30 tablet 0  . Lactulose 20 GM/30ML SOLN 15 ML by mouth twice daily.  May titrate as needed to 48m three times daily with goal of 2-3 soft BM's/day 1892 mL 5  . losartan (COZAAR) 100 MG tablet Take 1 tablet (100 mg total) by mouth daily. 90 tablet 3  . losartan (COZAAR) 100 MG tablet Take 1 tablet (100 mg total) by mouth daily. 90 tablet 1  . metFORMIN (GLUCOPHAGE-XR) 500 MG 24 hr tablet TAKE TWO TABLETS BY MOUTH DAILY WITH BREAKFAST 60 tablet 0  . metoprolol succinate (TOPROL-XL) 100 MG 24 hr tablet TAKE ONE TABLET BY MOUTH DAILY WITH OR IMMEDIATELY FOLLOWING A MEAL AS DIRECTED 30 tablet 0  . montelukast (SINGULAIR) 10 MG tablet TAKE ONE TABLET BY MOUTH EVERY NIGHT AT BEDTIME 90 tablet 1  . morphine (MSIR) 15 MG tablet Take 0.5 tablets (7.5 mg total) by mouth every 4 (four) hours as needed for severe pain. 3 tablet 0  . Multiple Vitamins-Minerals (MULTIVITAMIN WITH MINERALS) tablet Take 1 tablet by mouth daily.    .Marland Kitchen  nystatin (NYSTATIN) powder Apply topically 2 (two) times daily. 30 g 2  . omeprazole (PRILOSEC) 20 MG capsule TAKE TWO CAPSULES BY MOUTH EVERY MORNING BEFORE BREAKFAST AND 1 CAPSULE EVERY EVENING BEFORE SUPPER 90 capsule 3  . ondansetron (ZOFRAN) 4 MG tablet Take 1 tablet (4 mg total) by mouth every 8 (eight) hours as needed for nausea or vomiting. 20 tablet 0  . potassium chloride (KLOR-CON) 10 MEQ tablet TAKE ONE TABLET BY MOUTH EVERY OTHER  DAY 15 tablet 4  . Prenatal Vit-Fe Fumarate-FA (MULTIVITAMIN-PRENATAL) 27-0.8 MG TABS tablet Take 1 tablet by mouth daily.     Marland Kitchen Ubrogepant (UBRELVY) 100 MG TABS Take 1 tablet by mouth as needed (May repeat in 2 hours.  Maximum 2 tablets in 24 hours). 10 tablet 11   No current facility-administered medications on file prior to visit.    BP 128/70 (BP Location: Left Arm, Patient Position: Sitting, Cuff Size: Large)   Pulse 77   Temp 97.7 F (36.5 C) (Temporal)   Resp 19   Ht 5' 6"  (1.676 m)   Wt 282 lb (127.9 kg)   LMP 01/01/2020   SpO2 95%   BMI 45.52 kg/m       Objective:   Physical Exam Physical Exam  Constitutional: She is oriented to person, place, and time. She appears well-developed and well-nourished. No distress.  HENT:  Head: Normocephalic and atraumatic.  Right Ear: Tympanic membrane and ear canal normal.  Left Ear: Tympanic membrane and ear canal normal.  Mouth/Throat: not examined Eyes: Pupils are equal, round, and reactive to light. No scleral icterus.  Neck: Normal range of motion. No thyromegaly present.  Cardiovascular: Normal rate and regular rhythm.   No murmur heard. Pulmonary/Chest: Effort normal and breath sounds normal. No respiratory distress. He has no wheezes. She has no rales. She exhibits no tenderness.  Abdominal: Soft. Bowel sounds are normal. She exhibits no distension and no mass. There is no tenderness. There is no rebound and no guarding.  Musculoskeletal: She exhibits no edema.  Lymphadenopathy:    She has no cervical adenopathy.  Neurological: She is alert and oriented to person, place, and time. She has normal patellar reflexes. She exhibits normal muscle tone. Coordination normal.  Skin: Skin is warm and dry. Multiple puncture bite wounds noted on both hands and right forearm.  Some acne noted on chin. Psychiatric: She has a normal mood and affect. Her behavior is normal. Judgment and thought content normal.  Breast/pelvic: deferred       Assessment & Plan:   Preventative care-mammogram is scheduled.  Immunizations reviewed and up-to-date.  We discussed healthy diet, exercise, and weight loss.  Mammogram is up-to-date will obtain routine lab work.  Dog bite-wounds appear clean and dry.  No sign of infection.  Encourage patient to continue to monitor and to complete antibiotics.  She will call if she develops redness drainage or odor.  Acne-prescription provided for Benzamycin gel.  This visit occurred during the SARS-CoV-2 public health emergency.  Safety protocols were in place, including screening questions prior to the visit, additional usage of staff PPE, and extensive cleaning of exam room while observing appropriate contact time as indicated for disinfecting solutions.        Assessment & Plan:

## 2020-01-10 ENCOUNTER — Other Ambulatory Visit: Payer: Self-pay | Admitting: Family

## 2020-01-10 DIAGNOSIS — E1165 Type 2 diabetes mellitus with hyperglycemia: Secondary | ICD-10-CM

## 2020-01-10 DIAGNOSIS — I10 Essential (primary) hypertension: Secondary | ICD-10-CM

## 2020-01-13 ENCOUNTER — Encounter: Payer: Self-pay | Admitting: Family

## 2020-01-13 MED ORDER — FLUCONAZOLE 150 MG PO TABS: ORAL_TABLET | ORAL | 0 refills | Status: DC

## 2020-01-14 ENCOUNTER — Other Ambulatory Visit: Payer: Self-pay | Admitting: Family

## 2020-01-14 DIAGNOSIS — E1165 Type 2 diabetes mellitus with hyperglycemia: Secondary | ICD-10-CM

## 2020-01-14 DIAGNOSIS — I10 Essential (primary) hypertension: Secondary | ICD-10-CM

## 2020-01-24 ENCOUNTER — Other Ambulatory Visit: Payer: Self-pay

## 2020-01-24 ENCOUNTER — Ambulatory Visit
Admission: RE | Admit: 2020-01-24 | Discharge: 2020-01-24 | Disposition: A | Discharge status: Home / Self Care | Payer: Managed Care, Other (non HMO) | Source: Ambulatory Visit | Attending: Family | Admitting: Family

## 2020-01-24 ENCOUNTER — Ambulatory Visit: Payer: Managed Care, Other (non HMO)

## 2020-01-24 DIAGNOSIS — Z1231 Encounter for screening mammogram for malignant neoplasm of breast: Secondary | ICD-10-CM

## 2020-02-14 ENCOUNTER — Encounter: Payer: Self-pay | Admitting: Family

## 2020-02-14 ENCOUNTER — Other Ambulatory Visit: Payer: Self-pay

## 2020-02-14 ENCOUNTER — Ambulatory Visit (INDEPENDENT_AMBULATORY_CARE_PROVIDER_SITE_OTHER): Payer: Managed Care, Other (non HMO) | Admitting: Family

## 2020-02-14 VITALS — BP 125/68 | HR 78 | Temp 98.8°F | Resp 16 | Ht 66.0 in | Wt 285.0 lb

## 2020-02-14 DIAGNOSIS — D72829 Elevated white blood cell count, unspecified: Secondary | ICD-10-CM

## 2020-02-14 DIAGNOSIS — E1121 Type 2 diabetes mellitus with diabetic nephropathy: Secondary | ICD-10-CM

## 2020-02-14 NOTE — Progress Notes (Signed)
Subjective:    Patient ID: Stephanie Miles, female    DOB: August 16, 1980, 39 y.o.   MRN: [id number]  HPI  Patient is a 39 year old female who presents today to discuss elevated WBC count which was noted at Dr. Melony Overly office. Patient's WBC count was 14.4.  Denies fever but notes that her temp usually runs around 97.5.  Dog bite is healing.    Reports increased heartburn lately.  She has been referred to bariatrics at [facility]  Working 50-60 hrs a week. No recent steroids.    Wt Readings from Last 3 Encounters:  02/14/20 (!) 285 lb (129.3 kg)  01/06/20 282 lb (127.9 kg)  01/03/20 275 lb (124.7 kg)   She feels tired a lot.  She has felt "hot a lot."   Review of Systems  HENT: Negative for rhinorrhea.   Respiratory: Negative for cough.   Genitourinary: Negative for dysuria.   Past Medical History:  Diagnosis Date  . Allergy   . Anxiety   . Aortic regurgitation due to bicuspid aortic valve   . Arthritis   . Borderline diabetes 10/20/2016  . Chronic kidney disease    kidney stones  . Depression   . Diabetes type 2, controlled ([place]) 10/20/2016  . Family history of breast cancer in female   . Family history of colon cancer   . Fatty liver 11/28/2016  . GERD (gastroesophageal reflux disease)   . Heart murmur   . Hypertension   . IBS (irritable bowel syndrome)   . Insomnia   . Liver cancer ([place]) 03/2017  . Migraine headache   . NASH (nonalcoholic steatohepatitis) 04/04/2017   Per biopsy 9/18  . OCD (obsessive compulsive disorder)   . Orbital cellulitis    at age 54 was hospitalized for 3 months (almost died)   . OSA (obstructive sleep apnea)   . S/P appy 05/2013  . Sleep apnea      Social History   Socioeconomic History  . Marital status: Single    Spouse name: Not on file  . Number of children: 0  . Years of education: Not on file  . Highest education level: Not on file  Occupational History  . Occupation: Ship broker  . Occupation: Programmer, applications  Tobacco Use  .  Smoking status: Former Smoker    Packs/day: 1.00    Years: 20.00    Pack years: 20.00    Types: Cigarettes    Quit date: 02/22/2013    Years since quitting: 6.9  . Smokeless tobacco: Never Used  Vaping Use  . Vaping Use: Never used  Substance and Sexual Activity  . Alcohol use: Yes    Alcohol/week: 0.0 standard drinks    Comment: occ  . Drug use: No  . Sexual activity: Not Currently  Other Topics Concern  . Not on file  Social History Narrative   Working at [employer] doing [employer] alone (has a Neurosurgeon)   Working on a degree at [employer], will plan to return fall 2018   Enjoys spending time with family.   Friends and family in [place] Determinants of Health   Financial Resource Strain:   . Difficulty of Paying Living Expenses:   Food Insecurity:   . Worried About Charity fundraiser in the Last Year:   . Arboriculturist in the Last Year:   Transportation Needs:   . Film/video editor (Medical):   Marland Kitchen Lack of Transportation (Non-Medical):  Physical Activity:   . Days of Exercise per Week:   . Minutes of Exercise per Session:   Stress:   . Feeling of Stress :   Social Connections:   . Frequency of Communication with Friends and Family:   . Frequency of Social Gatherings with Friends and Family:   . Attends Religious Services:   . Active Member of Clubs or Organizations:   . Attends Archivist Meetings:   Marland Kitchen Marital Status:   Intimate Partner Violence:   . Fear of Current or Ex-Partner:   . Emotionally Abused:   Marland Kitchen Physically Abused:   . Sexually Abused:     Past Surgical History:  Procedure Laterality Date  . COLONOSCOPY    . DENTAL SURGERY     wisdom teeth  . ESOPHAGOGASTRODUODENOSCOPY    . LAPAROSCOPIC APPENDECTOMY N/A 06/06/2013   Procedure: APPENDECTOMY LAPAROSCOPIC;  Surgeon: Imogene Burn. Georgette Dover, MD;  Location: [facility];  Service: General;  Laterality: N/A;  . LIVER BIOPSY      Family History  Problem Relation Age of Onset    . Asthma Mother   . Hypertension Mother   . Breast cancer Father 54  . Diabetes Father   . Hypertension Father   . Heart disease Father   . Hyperlipidemia Father   . Asthma Sister   . Hyperlipidemia Brother   . Hypertension Brother   . Vision loss Brother   . Colon cancer Maternal Grandmother 81  . Liver cancer Maternal Grandmother   . Cervical cancer Maternal Aunt   . Stroke Paternal Uncle 17  . Melanoma Maternal Grandfather 68  . Colon polyps Maternal Grandfather   . AAA (abdominal aortic aneurysm) Paternal Grandmother   . Heart disease Paternal Grandfather   . Heart disease Paternal Uncle 35  . Colon cancer Other 78       MGM's mother  . Stomach cancer Other 31       MGM's mother  . Breast cancer Other        MGM's maternal aunt  . Cancer Other        MGM's maternal uncle with GI cancer  . Esophageal cancer Neg Hx   . Pancreatic cancer Neg Hx   . Rectal cancer Neg Hx     Allergies  Allergen Reactions  . Dust Mite Extract Shortness Of Breath and Cough  . Mold Extract [Trichophyton] Shortness Of Breath and Cough  . Nsaids Other (See Comments)    Patient is to NOT have NSAIDS  . Codeine Nausea Only    Required use of an antiemetic   . Amlodipine Swelling and Other (See Comments)    Edema   . Tape Rash    Medical tape and Band-Aids aren't tolerated    Current Outpatient Medications on File Prior to Visit  Medication Sig Dispense Refill  . ACCU-CHEK FASTCLIX LANCETS MISC Check sugar twice daily 102 each 3  . ACCU-CHEK GUIDE test strip USE TO CHECK BLOOD SUGAR TWO TIMES A DAY 100 each 0  . acetaminophen (TYLENOL) 500 MG tablet Take 500-1,000 mg by mouth every 6 (six) hours as needed (for pain).     Marland Kitchen albuterol (VENTOLIN HFA) 108 (90 Base) MCG/ACT inhaler Inhale 2 puffs into the lungs every 6 (six) hours as needed for wheezing or shortness of breath.    . ALPRAZolam (XANAX) 0.5 MG tablet TAKE ONE TABLET BY MOUTH TWICE A DAY AS NEEDED 45 tablet 0  .  amoxicillin-clavulanate (AUGMENTIN) 875-125 MG tablet Take 1  tablet by mouth every 12 (twelve) hours. 14 tablet 0  . benzoyl peroxide-erythromycin (BENZAMYCIN) gel Apply topically 2 (two) times daily. 23.3 g 5  . Blood Glucose Monitoring Suppl (ACCU-CHEK GUIDE) w/Device KIT 1 each by Does not apply route daily. 1 kit 0  . budesonide-formoterol (SYMBICORT) 160-4.5 MCG/ACT inhaler INHALE TWO PUFFS BY MOUTH TWICE A DAY 10.2 g 5  . buPROPion (WELLBUTRIN XL) 150 MG 24 hr tablet TAKE ONE TABLET BY MOUTH DAILY 90 tablet 0  . cetirizine (ZYRTEC) 10 MG tablet Take 10 mg by mouth daily.    . Cholecalciferol (VITAMIN D3) 50 MCG (2000 UT) TABS Take 2,000 Units by mouth daily.    . clotrimazole-betamethasone (LOTRISONE) cream Apply 1 application topically 2 (two) times daily. 45 g 1  . dicyclomine (BENTYL) 20 MG tablet TAKE ONE TABLET BY MOUTH FOUR TIMES A DAY BEFORE MEALS AND AT BEDTIME 80 tablet 2  . DOXORUBICIN HCL IV Inject into the vein See admin instructions.     . DULoxetine (CYMBALTA) 60 MG capsule TAKE ONE CAPSULE BY MOUTH DAILY 90 capsule 4  . Erenumab-aooe (AIMOVIG) 70 MG/ML SOAJ Inject 70 mg into the skin every 30 (thirty) days. 1 pen 11  . fluconazole (DIFLUCAN) 150 MG tablet Take 1 tablet by mouth today, repeat in 1 week. 2 tablet 0  . fluticasone (FLONASE) 50 MCG/ACT nasal spray SPRAY TWO SPRAYS IN EACH NOSTRIL ONCE DAILY 9.9 mL 2  . furosemide (LASIX) 40 MG tablet TAKE ONE TABLET BY MOUTH EVERY MORNING 90 tablet 1  . Lactulose 20 GM/30ML SOLN 15 ML by mouth twice daily.  May titrate as needed to 67m three times daily with goal of 2-3 soft BM's/day 1892 mL 5  . losartan (COZAAR) 100 MG tablet Take 1 tablet (100 mg total) by mouth daily. 90 tablet 3  . losartan (COZAAR) 100 MG tablet Take 1 tablet (100 mg total) by mouth daily. 90 tablet 1  . metFORMIN (GLUCOPHAGE-XR) 500 MG 24 hr tablet TAKE TWO TABLETS BY MOUTH DAILY WITH BREAKFAST 180 tablet 1  . metoprolol succinate (TOPROL-XL) 100 MG 24 hr  tablet TAKE ONE TABLET BY MOUTH DAILY OR IMMEDIATELY FOLLOWING A MEAL AS DIRECTED 90 tablet 1  . montelukast (SINGULAIR) 10 MG tablet TAKE ONE TABLET BY MOUTH EVERY NIGHT AT BEDTIME 90 tablet 1  . morphine (MSIR) 15 MG tablet Take 0.5 tablets (7.5 mg total) by mouth every 4 (four) hours as needed for severe pain. 3 tablet 0  . Multiple Vitamins-Minerals (MULTIVITAMIN WITH MINERALS) tablet Take 1 tablet by mouth daily.    .Marland Kitchennystatin (NYSTATIN) powder Apply topically 2 (two) times daily. 30 g 2  . omeprazole (PRILOSEC) 20 MG capsule TAKE TWO CAPSULES BY MOUTH EVERY MORNING BEFORE BREAKFAST AND TAKE ONE CAPSULE BY MOUTH EVERY EVENING BEFORE SUPPER 90 capsule 1  . ondansetron (ZOFRAN) 4 MG tablet Take 1 tablet (4 mg total) by mouth every 8 (eight) hours as needed for nausea or vomiting. 20 tablet 0  . potassium chloride (KLOR-CON) 10 MEQ tablet TAKE ONE TABLET BY MOUTH EVERY OTHER DAY 15 tablet 4  . Prenatal Vit-Fe Fumarate-FA (MULTIVITAMIN-PRENATAL) 27-0.8 MG TABS tablet Take 1 tablet by mouth daily.     .Marland KitchenUbrogepant (UBRELVY) 100 MG TABS Take 1 tablet by mouth as needed (May repeat in 2 hours.  Maximum 2 tablets in 24 hours). 10 tablet 11   No current facility-administered medications on file prior to visit.    BP 125/68 (BP Location: Right Arm, Patient  Position: Sitting, Cuff Size: Large)   Pulse 78   Temp 98.8 F (37.1 C) (Oral)   Resp 16   Ht 5' 6" (1.676 m)   Wt (!) 285 lb (129.3 kg)   SpO2 99%   BMI 46.00 kg/m        Objective:   Physical Exam Constitutional:      Appearance: She is well-developed.  HENT:     Right Ear: Tympanic membrane and ear canal normal.     Left Ear: Tympanic membrane and ear canal normal.  Cardiovascular:     Rate and Rhythm: Normal rate and regular rhythm.     Heart sounds: Normal heart sounds. No murmur heard.   Pulmonary:     Effort: Pulmonary effort is normal. No respiratory distress.     Breath sounds: Normal breath sounds. No wheezing.    Abdominal:     Palpations: Abdomen is soft.     Tenderness: There is no abdominal tenderness.  Skin:    General: Skin is warm.  Psychiatric:        Behavior: Behavior normal.        Thought Content: Thought content normal.        Judgment: Judgment normal.           Assessment & Plan:  Leukocytosis- etiology unclear. Asymptomatic. Denies recent steroid use. Will check repeat CBC, BMET, urine culture. Further recommendations following review of these results.   This visit occurred during the SARS-CoV-2 public health emergency.  Safety protocols were in place, including screening questions prior to the visit, additional usage of staff PPE, and extensive cleaning of exam room while observing appropriate contact time as indicated for disinfecting solutions.

## 2020-02-15 LAB — CBC WITH DIFFERENTIAL/PLATELET
Absolute Monocytes: 845 cells/uL (ref 200–950)
Basophils Absolute: 95 cells/uL (ref 0–200)
Basophils Relative: 0.8 %
Eosinophils Absolute: 286 cells/uL (ref 15–500)
Eosinophils Relative: 2.4 %
HCT: 43 % (ref 35.0–45.0)
Hemoglobin: 14.4 g/dL (ref 11.7–15.5)
Lymphs Abs: 4451 cells/uL — ABNORMAL HIGH (ref 850–3900)
MCH: 29.2 pg (ref 27.0–33.0)
MCHC: 33.5 g/dL (ref 32.0–36.0)
MCV: 87.2 fL (ref 80.0–100.0)
MPV: 12 fL (ref 7.5–12.5)
Monocytes Relative: 7.1 %
Neutro Abs: 6224 cells/uL (ref 1500–7800)
Neutrophils Relative %: 52.3 %
Platelets: 294 10*3/uL (ref 140–400)
RBC: 4.93 10*6/uL (ref 3.80–5.10)
RDW: 13.1 % (ref 11.0–15.0)
Total Lymphocyte: 37.4 %
WBC: 11.9 10*3/uL — ABNORMAL HIGH (ref 3.8–10.8)

## 2020-02-15 LAB — BASIC METABOLIC PANEL
BUN: 10 mg/dL (ref 7–25)
CO2: 29 mmol/L (ref 20–32)
Calcium: 9.7 mg/dL (ref 8.6–10.2)
Chloride: 100 mmol/L (ref 98–110)
Creat: 0.89 mg/dL (ref 0.50–1.10)
Glucose, Bld: 161 mg/dL — ABNORMAL HIGH (ref 65–99)
Potassium: 3.7 mmol/L (ref 3.5–5.3)
Sodium: 138 mmol/L (ref 135–146)

## 2020-02-17 ENCOUNTER — Encounter: Payer: Self-pay | Admitting: Family

## 2020-02-17 LAB — UNLABELED: Test Ordered On Req: 395

## 2020-02-18 ENCOUNTER — Other Ambulatory Visit: Payer: Managed Care, Other (non HMO)

## 2020-02-18 ENCOUNTER — Other Ambulatory Visit: Payer: Self-pay

## 2020-02-18 DIAGNOSIS — D72829 Elevated white blood cell count, unspecified: Secondary | ICD-10-CM

## 2020-02-18 LAB — SPECIMEN ID NOTIFICATION MISSING 2ND ID

## 2020-02-18 NOTE — Addendum Note (Signed)
Addended by: Kelle Darting A on: 02/18/2020 10:05 AM   Modules accepted: Orders

## 2020-02-19 ENCOUNTER — Other Ambulatory Visit: Payer: Self-pay | Admitting: Family

## 2020-02-19 LAB — URINE CULTURE
MICRO NUMBER:: 10754601
SPECIMEN QUALITY:: ADEQUATE

## 2020-02-19 NOTE — Telephone Encounter (Signed)
Urine was recollected and sent to [facility]

## 2020-03-06 ENCOUNTER — Encounter: Payer: Self-pay | Admitting: Neurology

## 2020-03-06 NOTE — Progress Notes (Addendum)
[facility] (Key: [id number]) Aimovig 70MG/ML auto-injectors   Form [employer] Electronic PA Form [phone] NCPDP) Created 6 days ago Sent to Plan 6 days ago Plan Response 6 days ago Submit Clinical Questions 6 days ago Determination Favorable 4 hours ago Message from Plan CaseId:[id number];Status:Approved;Review Type:Prior Auth;Coverage Start Date:03/06/2020;Coverage End Date:03/12/2021;

## 2020-03-11 ENCOUNTER — Encounter: Payer: Self-pay | Admitting: Family

## 2020-04-01 ENCOUNTER — Telehealth: Payer: Self-pay | Admitting: Family

## 2020-04-01 NOTE — Telephone Encounter (Signed)
Please see if there are virtual visits available at one of our other sites tomorrow.  Dr. Colin Benton only does virtual visits- you can check her schedule as well as [place] please.  If not, then I would recommend urgent care.

## 2020-04-01 NOTE — Telephone Encounter (Signed)
CallerSharalyn Lomba  Call Back # [phone]  Patient states she is having dizziness, ear pain.patient also states a slight temperature.    Please Advise

## 2020-04-01 NOTE — Telephone Encounter (Signed)
No available appointments here tomorrow for vv, please advise

## 2020-04-01 NOTE — Telephone Encounter (Signed)
Patient was scheduled to have virtual visit with Percell Miller tomorrow at 8:20 (he had a cancellation)

## 2020-04-02 ENCOUNTER — Telehealth (INDEPENDENT_AMBULATORY_CARE_PROVIDER_SITE_OTHER): Payer: Managed Care, Other (non HMO) | Admitting: Medical

## 2020-04-02 ENCOUNTER — Other Ambulatory Visit: Payer: Self-pay

## 2020-04-02 VITALS — HR 80 | Temp 98.9°F

## 2020-04-02 DIAGNOSIS — M791 Myalgia, unspecified site: Secondary | ICD-10-CM

## 2020-04-02 DIAGNOSIS — H9209 Otalgia, unspecified ear: Secondary | ICD-10-CM

## 2020-04-02 DIAGNOSIS — J3489 Other specified disorders of nose and nasal sinuses: Secondary | ICD-10-CM

## 2020-04-02 DIAGNOSIS — R195 Other fecal abnormalities: Secondary | ICD-10-CM | POA: Diagnosis not present

## 2020-04-02 MED ORDER — AZITHROMYCIN 250 MG PO TABS: ORAL_TABLET | ORAL | 0 refills | Status: DC

## 2020-04-02 MED ORDER — FLUTICASONE PROPIONATE 50 MCG/ACT NA SUSP: 2.0000 | Freq: Every day | NASAL | 1 refills | Status: DC

## 2020-04-02 MED ORDER — NEOMYCIN-POLYMYXIN-HC 3.5-10000-1 OT SOLN: 3.0000 [drp] | Freq: Four times a day (QID) | OTIC | 0 refills | Status: DC

## 2020-04-02 NOTE — Progress Notes (Signed)
Subjective:    Patient ID: Stephanie Miles, female    DOB: [birth year], 39 y.o.   MRN: [id number]  HPI  Virtual Visit via Video Note  I connected with Stephanie Miles on 04/02/20 at  8:40 AM EDT by a video enabled telemedicine application and verified that I am speaking with the correct person using two identifiers.  Location: Patient: home Provider: office.   I discussed the limitations of evaluation and management by telemedicine and the availability of in person appointments. The patient expressed understanding and agreed to proceed.   Pt can't find her bp cuff.  History of Present Illness: Pt in with some ear region discomfort and she feels intermittent  dizzy. Some ear pressure. She has been very tired. She slept all day on Monday. Not reporting nasal congestion. Rare cough but she states associated with occasional reflux. She does have some maxillary sinus region pain and ethmoid sinus pain. When she presses on rt tragus feel like something in ear. Mild st in morning on waking.  Pt states she has had temp 97-101.5.  Pt is diabetic and sugars have been 140's.  Pt has been checking her 02 saturations. No cough. Normal smell and taste.  She has been vaccinated against covid. She got phizer vaccine and got second dose April 9.     Observations/Objective: General-no acute distress, pleasant, oriented. Lungs- on inspection lungs appear unlabored. Neck- no tracheal deviation or jvd on inspection. Neuro- gross motor function appears intact. heent- faint maxillary and ethmoid sinus pressure on self palpation. Faint ear pressure sensation on palpation of both tragus. Rt side worse than left.  Assessment and Plan:   Follow Up Instructions:    I discussed the assessment and treatment plan with the patient. The patient was provided an opportunity to ask questions and all were answered. The patient agreed with the plan and demonstrated an understanding of the  instructions.   The patient was advised to call back or seek an in-person evaluation if the symptoms worsen or if the condition fails to improve as anticipated.  I provided 30 minutes of non-face-to-face time during this encounter.   Mackie Pai, PA-C    Review of Systems  Constitutional: Positive for fatigue and fever. Negative for chills.  HENT: Positive for ear pain, sinus pressure and sinus pain. Negative for congestion, ear discharge, sore throat and trouble swallowing.   Respiratory: Positive for cough. Negative for chest tightness.        Rare cough with reflux.  Cardiovascular: Negative for chest pain and palpitations.  Gastrointestinal: Negative for abdominal distention, abdominal pain, blood in stool and nausea.       Loose stools. But his of ibs.  Musculoskeletal: Positive for myalgias.  Skin: Negative for rash.  Neurological: Positive for dizziness. Negative for seizures, syncope, weakness and headaches.       Mild intermittent dizziness.  Hematological: Negative for adenopathy. Does not bruise/bleed easily.  Psychiatric/Behavioral: Negative for behavioral problems and decreased concentration.    Past Medical History:  Diagnosis Date  . Allergy   . Anxiety   . Aortic regurgitation due to bicuspid aortic valve   . Arthritis   . Borderline diabetes 10/20/2016  . Chronic kidney disease    kidney stones  . Depression   . Diabetes type 2, controlled ([facility]) 10/20/2016  . Family history of breast cancer in female   . Family history of colon cancer   . Fatty liver 11/28/2016  . GERD (gastroesophageal reflux disease)   .  Heart murmur   . Hypertension   . IBS (irritable bowel syndrome)   . Insomnia   . Liver cancer ([place]) 03/2017  . Migraine headache   . NASH (nonalcoholic steatohepatitis) 04/04/2017   Per biopsy 9/18  . OCD (obsessive compulsive disorder)   . Orbital cellulitis    at age 51 was hospitalized for 3 months (almost died)   . OSA (obstructive sleep apnea)    . S/P appy 05/2013  . Sleep apnea      Social History   Socioeconomic History  . Marital status: Single    Spouse name: Not on file  . Number of children: 0  . Years of education: Not on file  . Highest education level: Not on file  Occupational History  . Occupation: Ship broker  . Occupation: Programmer, applications  Tobacco Use  . Smoking status: Former Smoker    Packs/day: 1.00    Years: 20.00    Pack years: 20.00    Types: Cigarettes    Quit date: 02/22/2013    Years since quitting: 7.1  . Smokeless tobacco: Never Used  Vaping Use  . Vaping Use: Never used  Substance and Sexual Activity  . Alcohol use: Yes    Alcohol/week: 0.0 standard drinks    Comment: occ  . Drug use: No  . Sexual activity: Not Currently  Other Topics Concern  . Not on file  Social History Narrative   Working at [employer] doing [employer] alone (has a Neurosurgeon)   Working on a degree at [place], will plan to return fall 2018   Enjoys spending time with family.   Friends and family in [place] Determinants of Health   Financial Resource Strain:   . Difficulty of Paying Living Expenses: Not on file  Food Insecurity:   . Worried About Charity fundraiser in the Last Year: Not on file  . Ran Out of Food in the Last Year: Not on file  Transportation Needs:   . Lack of Transportation (Medical): Not on file  . Lack of Transportation (Non-Medical): Not on file  Physical Activity:   . Days of Exercise per Week: Not on file  . Minutes of Exercise per Session: Not on file  Stress:   . Feeling of Stress : Not on file  Social Connections:   . Frequency of Communication with Friends and Family: Not on file  . Frequency of Social Gatherings with Friends and Family: Not on file  . Attends Religious Services: Not on file  . Active Member of Clubs or Organizations: Not on file  . Attends Archivist Meetings: Not on file  . Marital Status: Not on file  Intimate Partner Violence:   .  Fear of Current or Ex-Partner: Not on file  . Emotionally Abused: Not on file  . Physically Abused: Not on file  . Sexually Abused: Not on file    Past Surgical History:  Procedure Laterality Date  . COLONOSCOPY    . DENTAL SURGERY     wisdom teeth  . ESOPHAGOGASTRODUODENOSCOPY    . LAPAROSCOPIC APPENDECTOMY N/A 06/06/2013   Procedure: APPENDECTOMY LAPAROSCOPIC;  Surgeon: Imogene Burn. Georgette Dover, MD;  Location: [facility];  Service: General;  Laterality: N/A;  . LIVER BIOPSY      Family History  Problem Relation Age of Onset  . Asthma Mother   . Hypertension Mother   . Breast cancer Father 37  . Diabetes Father   .  Hypertension Father   . Heart disease Father   . Hyperlipidemia Father   . Asthma Sister   . Hyperlipidemia Brother   . Hypertension Brother   . Vision loss Brother   . Colon cancer Maternal Grandmother 76  . Liver cancer Maternal Grandmother   . Cervical cancer Maternal Aunt   . Stroke Paternal Uncle 70  . Melanoma Maternal Grandfather 27  . Colon polyps Maternal Grandfather   . AAA (abdominal aortic aneurysm) Paternal Grandmother   . Heart disease Paternal Grandfather   . Heart disease Paternal Uncle 27  . Colon cancer Other 108       MGM's mother  . Stomach cancer Other 5       MGM's mother  . Breast cancer Other        MGM's maternal aunt  . Cancer Other        MGM's maternal uncle with GI cancer  . Esophageal cancer Neg Hx   . Pancreatic cancer Neg Hx   . Rectal cancer Neg Hx     Allergies  Allergen Reactions  . Dust Mite Extract Shortness Of Breath and Cough  . Mold Extract [Trichophyton] Shortness Of Breath and Cough  . Nsaids Other (See Comments)    Patient is to NOT have NSAIDS  . Codeine Nausea Only    Required use of an antiemetic   . Amlodipine Swelling and Other (See Comments)    Edema   . Tape Rash    Medical tape and Band-Aids aren't tolerated    Current Outpatient Medications on File Prior to Visit  Medication Sig Dispense Refill  .  ACCU-CHEK FASTCLIX LANCETS MISC Check sugar twice daily 102 each 3  . ACCU-CHEK GUIDE test strip USE TO CHECK BLOOD SUGAR TWO TIMES A DAY 100 each 0  . acetaminophen (TYLENOL) 500 MG tablet Take 500-1,000 mg by mouth every 6 (six) hours as needed (for pain).     Marland Kitchen albuterol (VENTOLIN HFA) 108 (90 Base) MCG/ACT inhaler Inhale 2 puffs into the lungs every 6 (six) hours as needed for wheezing or shortness of breath.    . ALPRAZolam (XANAX) 0.5 MG tablet TAKE ONE TABLET BY MOUTH TWICE A DAY AS NEEDED 45 tablet 0  . amoxicillin-clavulanate (AUGMENTIN) 875-125 MG tablet Take 1 tablet by mouth every 12 (twelve) hours. 14 tablet 0  . benzoyl peroxide-erythromycin (BENZAMYCIN) gel Apply topically 2 (two) times daily. 23.3 g 5  . Blood Glucose Monitoring Suppl (ACCU-CHEK GUIDE) w/Device KIT 1 each by Does not apply route daily. 1 kit 0  . budesonide-formoterol (SYMBICORT) 160-4.5 MCG/ACT inhaler INHALE TWO PUFFS BY MOUTH TWICE A DAY 10.2 g 5  . buPROPion (WELLBUTRIN XL) 150 MG 24 hr tablet Take 1 tablet (150 mg total) by mouth daily. 90 tablet 1  . cetirizine (ZYRTEC) 10 MG tablet Take 10 mg by mouth daily.    . Cholecalciferol (VITAMIN D3) 50 MCG (2000 UT) TABS Take 2,000 Units by mouth daily.    . clotrimazole-betamethasone (LOTRISONE) cream Apply 1 application topically 2 (two) times daily. 45 g 1  . dicyclomine (BENTYL) 20 MG tablet TAKE ONE TABLET BY MOUTH FOUR TIMES A DAY BEFORE MEALS AND AT BEDTIME 80 tablet 2  . DOXORUBICIN HCL IV Inject into the vein See admin instructions.     . DULoxetine (CYMBALTA) 60 MG capsule TAKE ONE CAPSULE BY MOUTH DAILY 90 capsule 4  . Erenumab-aooe (AIMOVIG) 70 MG/ML SOAJ Inject 70 mg into the skin every 30 (thirty) days. 1 pen  11  . fluconazole (DIFLUCAN) 150 MG tablet Take 1 tablet by mouth today, repeat in 1 week. 2 tablet 0  . fluticasone (FLONASE) 50 MCG/ACT nasal spray SPRAY TWO SPRAYS IN EACH NOSTRIL ONCE DAILY 9.9 mL 2  . furosemide (LASIX) 40 MG tablet TAKE ONE  TABLET BY MOUTH EVERY MORNING 90 tablet 1  . Lactulose 20 GM/30ML SOLN 15 ML by mouth twice daily.  May titrate as needed to 68m three times daily with goal of 2-3 soft BM's/day 1892 mL 5  . losartan (COZAAR) 100 MG tablet Take 1 tablet (100 mg total) by mouth daily. 90 tablet 3  . losartan (COZAAR) 100 MG tablet Take 1 tablet (100 mg total) by mouth daily. 90 tablet 1  . metFORMIN (GLUCOPHAGE-XR) 500 MG 24 hr tablet TAKE TWO TABLETS BY MOUTH DAILY WITH BREAKFAST 180 tablet 1  . metoprolol succinate (TOPROL-XL) 100 MG 24 hr tablet TAKE ONE TABLET BY MOUTH DAILY OR IMMEDIATELY FOLLOWING A MEAL AS DIRECTED 90 tablet 1  . montelukast (SINGULAIR) 10 MG tablet TAKE ONE TABLET BY MOUTH EVERY NIGHT AT BEDTIME 90 tablet 1  . morphine (MSIR) 15 MG tablet Take 0.5 tablets (7.5 mg total) by mouth every 4 (four) hours as needed for severe pain. 3 tablet 0  . Multiple Vitamins-Minerals (MULTIVITAMIN WITH MINERALS) tablet Take 1 tablet by mouth daily.    .Marland Kitchennystatin (NYSTATIN) powder Apply topically 2 (two) times daily. 30 g 2  . omeprazole (PRILOSEC) 20 MG capsule TAKE TWO CAPSULES BY MOUTH EVERY MORNING BEFORE BREAKFAST AND TAKE ONE CAPSULE BY MOUTH EVERY EVENING BEFORE SUPPER 90 capsule 1  . ondansetron (ZOFRAN) 4 MG tablet Take 1 tablet (4 mg total) by mouth every 8 (eight) hours as needed for nausea or vomiting. 20 tablet 0  . potassium chloride (KLOR-CON) 10 MEQ tablet TAKE ONE TABLET BY MOUTH EVERY OTHER DAY 15 tablet 4  . Prenatal Vit-Fe Fumarate-FA (MULTIVITAMIN-PRENATAL) 27-0.8 MG TABS tablet Take 1 tablet by mouth daily.     .Marland KitchenUbrogepant (UBRELVY) 100 MG TABS Take 1 tablet by mouth as needed (May repeat in 2 hours.  Maximum 2 tablets in 24 hours). 10 tablet 11   No current facility-administered medications on file prior to visit.    Pulse 80   Temp 98.9 F (37.2 C) (Oral)   SpO2 99%      Objective:   Physical Exam        Assessment & Plan:  For recent symptoms recommend that you attempt  to get rapid covid test. Hx of full vaccination but break through infection possible and typically symptoms milder most of the time.   For recent ear region discomfort and sinus pressure will rx flonase, azithromycin and cortisporin otic drops.  I would like for you to get bp cuff/reading.  If loose stools worsen let uKoreaknow. Sounds like you may have ibs flare. But if worsens then need to get stool panel.  If your signs and symptoms worsen drarmatically then UC and ED.  Follow up 7 days or as needed

## 2020-04-02 NOTE — Patient Instructions (Addendum)
For recent symptoms recommend that you attempt to get rapid covid test. Hx of full vaccination but break through infection possible and typically symptoms milder most of the time.   For recent ear region discomfort and sinus pressure will rx flonase, azithromycin and cortisporin otic drops.  I would like for you to get bp cuff/reading.  If loose stools worsen let us know. Sounds like you may have ibs flare. But if worsens then need to get stool panel.  If your signs and symptoms worsen drarmatically then UC and ED.  Follow up 7 days or as needed

## 2020-04-14 ENCOUNTER — Encounter: Payer: Managed Care, Other (non HMO) | Admitting: Gastroenterology

## 2020-04-21 ENCOUNTER — Other Ambulatory Visit: Payer: Self-pay | Admitting: Family

## 2020-04-21 DIAGNOSIS — R0600 Dyspnea, unspecified: Secondary | ICD-10-CM

## 2020-04-27 ENCOUNTER — Other Ambulatory Visit: Payer: Self-pay

## 2020-04-27 ENCOUNTER — Ambulatory Visit (INDEPENDENT_AMBULATORY_CARE_PROVIDER_SITE_OTHER): Payer: Managed Care, Other (non HMO) | Admitting: Neurology

## 2020-04-27 ENCOUNTER — Encounter: Payer: Self-pay | Admitting: Neurology

## 2020-04-27 VITALS — BP 146/92 | HR 93 | Ht 66.0 in | Wt 291.4 lb

## 2020-04-27 DIAGNOSIS — G43009 Migraine without aura, not intractable, without status migrainosus: Secondary | ICD-10-CM | POA: Diagnosis not present

## 2020-04-27 NOTE — Progress Notes (Signed)
NEUROLOGY FOLLOW UP OFFICE NOTE  Stephanie Miles [id number]  HISTORY OF PRESENT ILLNESS: Stephanie Miles is a 39 year old female with NASH, HTN, type 2 diabetes, fibromyalgia, OSA, depression and CKD with history of kidney stones who follows up for migraines.  UPDATE: Started Aimovig in January.  She is doing well. Intensity:  severe Duration:  [name] and intensity Frequency:  Once every 10 to 14 days. Frequency of abortive medication: every 10 to 14 days Current NSAIDS:  Contraindicated Current analgesics:  Tylenol Current triptans:  none Current ergotamine:  none Current anti-emetic:  Zofran 44m Current muscle relaxants:  none Current anti-anxiolytic:  alprazolam Current sleep aide:  none Current Antihypertensive medications:  Toprol XL 1040m urosemide; losartan Current Antidepressant medications:  Wellbutrin XL 15077mCymbalta 26m55mily Current Anticonvulsant medications:  none Current anti-CGRP:  Aimovig 70mg23mrelvy 100mg 25ment Vitamins/Herbal/Supplements:  MVI; prenatal, K-dur Current Antihistamines/Decongestants:  Zyrtec; Flonase Other therapy:  none Hormone/birth control:  none Other medications:  lactulose  Caffeine:  1 to 2 cups of coffee daily, 1 to 2 Red Bulls daily Alcohol:  no Smoker:  no Diet:  Hydrates with water, Gatorade Zero Exercise:  Walks her dog Depression:  stable; Anxiety:  yes Other pain:  fibromyalgia Sleep hygiene:  OSA.  Uses CPAP.  HISTORY: She reports migraines since 23 yea67 old.  They have become worse since October.  She has hepatocellular carcinoma and NASH.  Around that time, she started not feeling well and was found to have an elevated ammonia level of 54.  She was started on lactulose.  She also had fallen around that time and hit her head.  Previously, the migraines would occur every few months.  Since October, they occur about 4 times a month, lasting from a few hours to all day.  They are severe left or  right sided headaches radiating down back of head.  There is associated nausea, photophobia, phonophobia, but no vomiting, visual disturbance or unilateral numbness or weakness.  No particular triggers.  Tylenol helps ease the pain but does not resolve it.  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   Past NSAIDS/steroid:  Ibuprofen; Mobic; naproxen; prednisone Past analgesics:  Excedrin Past abortive triptans:  Sumatriptan Past abortive ergotamine:  none Past muscle relaxants:  none Past anti-emetic:  Promethazine  Past antihypertensive medications:  lisinopril Past antidepressant medications:  Venlafaxine 150mg t9m daily; Lexapro; trazodone Past anticonvulsant medications:  none Past anti-CGRP:  none Past vitamins/Herbal/Supplements:  magnesium Past antihistamines/decongestants:  none Other past therapies:  none   Family history of headache:  Brother, sister  PAST MEDICAL HISTORY: Past Medical History:  Diagnosis Date  . Allergy   . Anxiety   . Aortic regurgitation due to bicuspid aortic valve   . Arthritis   . Borderline diabetes 10/20/2016  . Chronic kidney disease    kidney stones  . Depression   . Diabetes type 2, controlled (HCC) 3/[place]2018  . Family history of breast cancer in female   . Family history of colon cancer   . Fatty liver 11/28/2016  . GERD (gastroesophageal reflux disease)   . Heart murmur   . Hypertension   . IBS (irritable bowel syndrome)   . Insomnia   . Liver cancer (HCC) [id number]  . Migraine headache   . NASH (nonalcoholic steatohepatitis) 04/04/2017   Per biopsy 9/18  . OCD (obsessive compulsive disorder)   . Orbital cellulitis  at age 17 was hospitalized for 3 months (almost died)   . OSA (obstructive sleep apnea)   . S/P appy 05/2013  . Sleep apnea     MEDICATIONS: Current Outpatient Medications on File Prior to Visit  Medication Sig Dispense Refill  . ACCU-CHEK FASTCLIX  LANCETS MISC Check sugar twice daily 102 each 3  . ACCU-CHEK GUIDE test strip USE TO CHECK BLOOD SUGAR TWO TIMES A DAY 100 each 0  . acetaminophen (TYLENOL) 500 MG tablet Take 500-1,000 mg by mouth every 6 (six) hours as needed (for pain).     Marland Kitchen albuterol (VENTOLIN HFA) 108 (90 Base) MCG/ACT inhaler Inhale 2 puffs into the lungs every 6 (six) hours as needed for wheezing or shortness of breath.    . ALPRAZolam (XANAX) 0.5 MG tablet TAKE ONE TABLET BY MOUTH TWICE A DAY AS NEEDED 45 tablet 0  . amoxicillin-clavulanate (AUGMENTIN) 875-125 MG tablet Take 1 tablet by mouth every 12 (twelve) hours. 14 tablet 0  . azithromycin (ZITHROMAX) 250 MG tablet Take 2 tablets by mouth on day 1, followed by 1 tablet by mouth daily for 4 days. 6 tablet 0  . benzoyl peroxide-erythromycin (BENZAMYCIN) gel Apply topically 2 (two) times daily. 23.3 g 5  . Blood Glucose Monitoring Suppl (ACCU-CHEK GUIDE) w/Device KIT 1 each by Does not apply route daily. 1 kit 0  . budesonide-formoterol (SYMBICORT) 160-4.5 MCG/ACT inhaler INHALE TWO PUFFS BY MOUTH TWICE A DAY 10.2 g 5  . buPROPion (WELLBUTRIN XL) 150 MG 24 hr tablet Take 1 tablet (150 mg total) by mouth daily. 90 tablet 1  . cetirizine (ZYRTEC) 10 MG tablet Take 10 mg by mouth daily.    . Cholecalciferol (VITAMIN D3) 50 MCG (2000 UT) TABS Take 2,000 Units by mouth daily.    . clotrimazole-betamethasone (LOTRISONE) cream Apply 1 application topically 2 (two) times daily. 45 g 1  . dicyclomine (BENTYL) 20 MG tablet TAKE ONE TABLET BY MOUTH FOUR TIMES A DAY BEFORE MEALS AND AT BEDTIME 80 tablet 2  . DULoxetine (CYMBALTA) 60 MG capsule TAKE ONE CAPSULE BY MOUTH DAILY 90 capsule 4  . Erenumab-aooe (AIMOVIG) 70 MG/ML SOAJ Inject 70 mg into the skin every 30 (thirty) days. 1 pen 11  . fluconazole (DIFLUCAN) 150 MG tablet Take 1 tablet by mouth today, repeat in 1 week. 2 tablet 0  . fluticasone (FLONASE) 50 MCG/ACT nasal spray SPRAY TWO SPRAYS IN EACH NOSTRIL ONCE DAILY 9.9 mL 2   . fluticasone (FLONASE) 50 MCG/ACT nasal spray Place 2 sprays into both nostrils daily. 16 g 1  . furosemide (LASIX) 40 MG tablet TAKE ONE TABLET BY MOUTH EVERY MORNING 90 tablet 1  . Lactulose 20 GM/30ML SOLN 15 ML by mouth twice daily.  May titrate as needed to 90m three times daily with goal of 2-3 soft BM's/day 1892 mL 5  . losartan (COZAAR) 100 MG tablet Take 1 tablet (100 mg total) by mouth daily. 90 tablet 3  . losartan (COZAAR) 100 MG tablet Take 1 tablet (100 mg total) by mouth daily. 90 tablet 1  . metFORMIN (GLUCOPHAGE-XR) 500 MG 24 hr tablet TAKE TWO TABLETS BY MOUTH DAILY WITH BREAKFAST 180 tablet 1  . metoprolol succinate (TOPROL-XL) 100 MG 24 hr tablet TAKE ONE TABLET BY MOUTH DAILY OR IMMEDIATELY FOLLOWING A MEAL AS DIRECTED 90 tablet 1  . montelukast (SINGULAIR) 10 MG tablet TAKE ONE TABLET BY MOUTH EVERY NIGHT AT BEDTIME 90 tablet 1  . morphine (MSIR) 15 MG tablet Take 0.5  tablets (7.5 mg total) by mouth every 4 (four) hours as needed for severe pain. 3 tablet 0  . Multiple Vitamins-Minerals (MULTIVITAMIN WITH MINERALS) tablet Take 1 tablet by mouth daily.    Marland Kitchen neomycin-polymyxin-hydrocortisone (CORTISPORIN) OTIC solution Place 3 drops into the right ear 4 (four) times daily. 10 mL 0  . nystatin (NYSTATIN) powder Apply topically 2 (two) times daily. 30 g 2  . omeprazole (PRILOSEC) 20 MG capsule TAKE TWO CAPSULES BY MOUTH EVERY MORNING BEFORE BREAKFAST AND TAKE ONE CAPSULE BY MOUTH EVERY EVENING BEFORE SUPPER 90 capsule 1  . ondansetron (ZOFRAN) 4 MG tablet Take 1 tablet (4 mg total) by mouth every 8 (eight) hours as needed for nausea or vomiting. 20 tablet 0  . potassium chloride (KLOR-CON) 10 MEQ tablet TAKE ONE TABLET BY MOUTH EVERY OTHER DAY 15 tablet 4  . Prenatal Vit-Fe Fumarate-FA (MULTIVITAMIN-PRENATAL) 27-0.8 MG TABS tablet Take 1 tablet by mouth daily.     Marland Kitchen Ubrogepant (UBRELVY) 100 MG TABS Take 1 tablet by mouth as needed (May repeat in 2 hours.  Maximum 2 tablets in 24  hours). 10 tablet 11   No current facility-administered medications on file prior to visit.    ALLERGIES: Allergies  Allergen Reactions  . Dust Mite Extract Shortness Of Breath and Cough  . Mold Extract [Trichophyton] Shortness Of Breath and Cough  . Nsaids Other (See Comments)    Patient is to NOT have NSAIDS  . Codeine Nausea Only    Required use of an antiemetic   . Amlodipine Swelling and Other (See Comments)    Edema   . Tape Rash    Medical tape and Band-Aids aren't tolerated    FAMILY HISTORY: Family History  Problem Relation Age of Onset  . Asthma Mother   . Hypertension Mother   . Breast cancer Father 69  . Diabetes Father   . Hypertension Father   . Heart disease Father   . Hyperlipidemia Father   . Asthma Sister   . Hyperlipidemia Brother   . Hypertension Brother   . Vision loss Brother   . Colon cancer Maternal Grandmother 4  . Liver cancer Maternal Grandmother   . Cervical cancer Maternal Aunt   . Stroke Paternal Uncle 27  . Melanoma Maternal Grandfather 106  . Colon polyps Maternal Grandfather   . AAA (abdominal aortic aneurysm) Paternal Grandmother   . Heart disease Paternal Grandfather   . Heart disease Paternal Uncle 70  . Colon cancer Other 73       MGM's mother  . Stomach cancer Other 35       MGM's mother  . Breast cancer Other        MGM's maternal aunt  . Cancer Other        MGM's maternal uncle with GI cancer  . Esophageal cancer Neg Hx   . Pancreatic cancer Neg Hx   . Rectal cancer Neg Hx     SOCIAL HISTORY: Social History   Socioeconomic History  . Marital status: Single    Spouse name: Not on file  . Number of children: 0  . Years of education: Not on file  . Highest education level: Not on file  Occupational History  . Occupation: Ship broker  . Occupation: Programmer, applications  Tobacco Use  . Smoking status: Former Smoker    Packs/day: 1.00    Years: 20.00    Pack years: 20.00    Types: Cigarettes    Quit date: 02/22/2013  Years since quitting: 7.1  . Smokeless tobacco: Never Used  Vaping Use  . Vaping Use: Never used  Substance and Sexual Activity  . Alcohol use: Yes    Alcohol/week: 0.0 standard drinks    Comment: occ  . Drug use: No  . Sexual activity: Not Currently  Other Topics Concern  . Not on file  Social History Narrative   Working at [employer] doing [employer] alone (has a Neurosurgeon)   Working on a degree at [employer], will plan to return fall 2018   Enjoys spending time with family.   Friends and family in [place] Determinants of Health   Financial Resource Strain:   . Difficulty of Paying Living Expenses: Not on file  Food Insecurity:   . Worried About Charity fundraiser in the Last Year: Not on file  . Ran Out of Food in the Last Year: Not on file  Transportation Needs:   . Lack of Transportation (Medical): Not on file  . Lack of Transportation (Non-Medical): Not on file  Physical Activity:   . Days of Exercise per Week: Not on file  . Minutes of Exercise per Session: Not on file  Stress:   . Feeling of Stress : Not on file  Social Connections:   . Frequency of Communication with Friends and Family: Not on file  . Frequency of Social Gatherings with Friends and Family: Not on file  . Attends Religious Services: Not on file  . Active Member of Clubs or Organizations: Not on file  . Attends Archivist Meetings: Not on file  . Marital Status: Not on file  Intimate Partner Violence:   . Fear of Current or Ex-Partner: Not on file  . Emotionally Abused: Not on file  . Physically Abused: Not on file  . Sexually Abused: Not on file    PHYSICAL EXAM: Blood pressure (!) 146/92, pulse 93, height 5' 6"  (1.676 m), weight 291 lb 6.4 oz (132.2 kg), SpO2 96 %. General: No acute distress.  Patient appears well-groomed.   Head:  Normocephalic/atraumatic Eyes:  Fundi examined but not visualized Neck: supple, no paraspinal tenderness, full range of motion Heart:   Regular rate and rhythm Lungs:  Clear to auscultation bilaterally Back: No paraspinal tenderness Neurological Exam: alert and oriented to person, place, and time. Attention span and concentration intact, recent and remote memory intact, fund of knowledge intact.  Speech fluent and not dysarthric, language intact.  CN II-XII intact. Bulk and tone normal, muscle strength 5/5 throughout.  Sensation to light touch, temperature and vibration intact.  Deep tendon reflexes 2+ throughout, toes downgoing.  Finger to nose and heel to shin testing intact.  Gait normal, Romberg negative.  IMPRESSION: Migraine without aura, without status migrainosus, not intractable  PLAN: 1.  For preventative management, Aimovig 68m monthly 2.  For abortive therapy, Ubrelvy 1014m3.  Limit use of pain relievers to no more than 2 days out of week to prevent risk of rebound or medication-overuse headache. 4.  Keep headache diary 5.  Exercise, hydration, caffeine cessation, sleep hygiene, monitor for and avoid triggers 6. Follow up one year   AdMetta ClinesDO  CC: MeDebbrah AlarNP

## 2020-04-27 NOTE — Patient Instructions (Signed)
1.  Continue Aimovig every 30 days 2.  Use Ubrelvy as directed. 3.  Follow up in one year

## 2020-04-28 ENCOUNTER — Ambulatory Visit (INDEPENDENT_AMBULATORY_CARE_PROVIDER_SITE_OTHER): Payer: Managed Care, Other (non HMO)

## 2020-04-28 DIAGNOSIS — Z23 Encounter for immunization: Secondary | ICD-10-CM | POA: Diagnosis not present

## 2020-04-28 NOTE — Progress Notes (Signed)
Pre visit review using our clinic review tool, if applicable. No additional management support is needed unless otherwise documented below in the visit note.  Patient here today for third and final HPV vaccine. 0.42m given in left deltoid IM. Patient tolerated well.   Patient also  here for flu vaccine. 0.56mflu vaccine given in right deltoid IM. Patient tolerated well. VIS given.NCIR updated.

## 2020-05-14 ENCOUNTER — Other Ambulatory Visit: Payer: Self-pay

## 2020-05-14 ENCOUNTER — Encounter: Payer: Self-pay | Admitting: Gastroenterology

## 2020-05-14 ENCOUNTER — Ambulatory Visit (AMBULATORY_SURGERY_CENTER): Payer: Self-pay | Admitting: *Deleted

## 2020-05-14 VITALS — Ht 66.0 in | Wt 286.0 lb

## 2020-05-14 DIAGNOSIS — K2270 Barrett's esophagus without dysplasia: Secondary | ICD-10-CM

## 2020-05-14 NOTE — Progress Notes (Signed)
No egg or soy allergy known to patient  No issues with past sedation with any surgeries or procedures no intubation problems in the past  No FH of Malignant Hyperthermia No diet pills per patient No home 02 use per patient  No blood thinners per patient  Pt denies issues with constipation  No A fib or A flutter  EMMI video to pt or via [place] 19 guidelines implemented in PV today with Pt and RN   cov vax x 2    Due to the COVID-19 pandemic we are asking patients to follow these guidelines. Please only bring one care partner. Please be aware that your care partner may wait in the car in the parking lot or if they feel like they will be too hot to wait in the car, they may wait in the lobby on the 4th floor. All care partners are required to wear a mask the entire time (we do not have any that we can provide them), they need to practice social distancing, and we will do a Covid check for all patient's and care partners when you arrive. Also we will check their temperature and your temperature. If the care partner waits in their car they need to stay in the parking lot the entire time and we will call them on their cell phone when the patient is ready for discharge so they can bring the car to the front of the building. Also all patient's will need to wear a mask into building.

## 2020-05-20 ENCOUNTER — Encounter: Payer: Self-pay | Admitting: Family

## 2020-05-21 MED ORDER — ONDANSETRON 4 MG PO TBDP: 4.0000 mg | ORAL_TABLET | Freq: Three times a day (TID) | ORAL | 0 refills | Status: DC | PRN

## 2020-05-27 ENCOUNTER — Encounter: Payer: Self-pay | Admitting: Gastroenterology

## 2020-05-27 ENCOUNTER — Ambulatory Visit (AMBULATORY_SURGERY_CENTER): Payer: Managed Care, Other (non HMO) | Admitting: Gastroenterology

## 2020-05-27 ENCOUNTER — Other Ambulatory Visit: Payer: Self-pay

## 2020-05-27 VITALS — BP 146/80 | HR 68 | Temp 97.5°F | Resp 18 | Ht 66.0 in | Wt 286.0 lb

## 2020-05-27 DIAGNOSIS — K3189 Other diseases of stomach and duodenum: Secondary | ICD-10-CM | POA: Diagnosis not present

## 2020-05-27 DIAGNOSIS — C220 Liver cell carcinoma: Secondary | ICD-10-CM

## 2020-05-27 DIAGNOSIS — K2270 Barrett's esophagus without dysplasia: Secondary | ICD-10-CM

## 2020-05-27 DIAGNOSIS — K2970 Gastritis, unspecified, without bleeding: Secondary | ICD-10-CM | POA: Diagnosis not present

## 2020-05-27 MED ORDER — SODIUM CHLORIDE 0.9 % IV SOLN: 500.0000 mL | Freq: Once | INTRAVENOUS | Status: DC

## 2020-05-27 NOTE — Op Note (Signed)
[facility] Patient Name: Stephanie Miles Procedure Date: 05/27/2020 1:31 PM MRN: [id number] Endoscopist: Ladene Artist , MD Age: 39 Referring MD:  Date of Birth: June 06, 1981 Gender: Female Account #: [id number] Procedure:                Upper GI endoscopy Indications:              Surveillance for malignancy due to personal history                            of Barrett's esophagus Medicines:                Monitored Anesthesia Care Procedure:                Pre-Anesthesia Assessment:                           - Prior to the procedure, a History and Physical                            was performed, and patient medications and                            allergies were reviewed. The patient's tolerance of                            previous anesthesia was also reviewed. The risks                            and benefits of the procedure and the sedation                            options and risks were discussed with the patient.                            All questions were answered, and informed consent                            was obtained. Prior Anticoagulants: The patient has                            taken no previous anticoagulant or antiplatelet                            agents. ASA Grade Assessment: III - A patient with                            severe systemic disease. After reviewing the risks                            and benefits, the patient was deemed in                            satisfactory condition to undergo the procedure.  After obtaining informed consent, the endoscope was                            passed under direct vision. Throughout the                            procedure, the patient's blood pressure, pulse, and                            oxygen saturations were monitored continuously. The                            Endoscope was introduced through the mouth, and                            advanced to the second  part of duodenum. The upper                            GI endoscopy was accomplished without difficulty.                            The patient tolerated the procedure well. Scope In: Scope Out: Findings:                 There were esophageal mucosal changes secondary to                            established long-segment Barrett's disease present                            in the distal esophagus. The maximum longitudinal                            extent of these mucosal changes was 4 cm in length.                            Mucosa was biopsied with a cold forceps for                            histology. One specimen bottle was sent to                            pathology.                           The exam of the esophagus was otherwise normal.                           Patchy mildly erythematous mucosa without bleeding                            was found in the gastric antrum and in the  prepyloric region of the stomach. Biopsies were                            taken with a cold forceps for histology.                           A small hiatal hernia was present.                           The exam of the stomach was otherwise normal.                           The duodenal bulb and second portion of the                            duodenum were normal. Complications:            No immediate complications. Estimated Blood Loss:     Estimated blood loss was minimal. Impression:               - Esophageal mucosal changes secondary to                            established long-segment Barrett's disease.                            Biopsied.                           - Erythematous mucosa in the antrum and prepyloric                            region of the stomach. Biopsied.                           - Small hiatal hernia.                           - Normal duodenal bulb and second portion of the                            duodenum. Recommendation:           -  Patient has a contact number available for                            emergencies. The signs and symptoms of potential                            delayed complications were discussed with the                            patient. Return to normal activities tomorrow.                            Written discharge instructions were provided to the  patient.                           - Resume previous diet.                           - Continue present medications.                           - Await pathology results.                           - Repeat upper endoscopy in 3 years for                            surveillance of Barrett's esophagus pending                            pathology review. Ladene Artist, MD 05/27/2020 2:03:33 PM This report has been signed electronically.

## 2020-05-27 NOTE — Progress Notes (Signed)
Called to room to assist during endoscopic procedure.  Patient ID and intended procedure confirmed with present staff. Received instructions for my participation in the procedure from the performing physician.  

## 2020-05-27 NOTE — Progress Notes (Signed)
Vitals-NS  Pt's states no medical or surgical changes since previsit or office visit.

## 2020-05-27 NOTE — Progress Notes (Signed)
pt tolerated well. VSS. awake and to recovery. Report given to RN. Bite block atraumatic.

## 2020-05-27 NOTE — Patient Instructions (Signed)
Information on hiatal hernia, gastritis and Barrett's esophagus given to you today.  Await pathology results.  Resume previous diet and medications.    Repeat upper endoscopy in 3 years.  YOU HAD AN ENDOSCOPIC PROCEDURE TODAY AT [facility]:   Refer to the procedure report that was given to you for any specific questions about what was found during the examination.  If the procedure report does not answer your questions, please call your gastroenterologist to clarify.  If you requested that your care partner not be given the details of your procedure findings, then the procedure report has been included in a sealed envelope for you to review at your convenience later.  YOU SHOULD EXPECT: Some feelings of bloating in the abdomen. Passage of more gas than usual.  Walking can help get rid of the air that was put into your GI tract during the procedure and reduce the bloating. If you had a lower endoscopy (such as a colonoscopy or flexible sigmoidoscopy) you may notice spotting of blood in your stool or on the toilet paper. If you underwent a bowel prep for your procedure, you may not have a normal bowel movement for a few days.  Please Note:  You might notice some irritation and congestion in your nose or some drainage.  This is from the oxygen used during your procedure.  There is no need for concern and it should clear up in a day or so.  SYMPTOMS TO REPORT IMMEDIATELY:   Following upper endoscopy (EGD)  Vomiting of blood or coffee ground material  New chest pain or pain under the shoulder blades  Painful or persistently difficult swallowing  New shortness of breath  Fever of 100F or higher  Black, tarry-looking stools  For urgent or emergent issues, a gastroenterologist can be reached at any hour by calling [phone]. Do not use MyChart messaging for urgent concerns.    DIET:  We do recommend a small meal at first, but then you may proceed to your regular diet.   Drink plenty of fluids but you should avoid alcoholic beverages for 24 hours.  ACTIVITY:  You should plan to take it easy for the rest of today and you should NOT DRIVE or use heavy machinery until tomorrow (because of the sedation medicines used during the test).    FOLLOW UP: Our staff will call the number listed on your records 48-72 hours following your procedure to check on you and address any questions or concerns that you may have regarding the information given to you following your procedure. If we do not reach you, we will leave a message.  We will attempt to reach you two times.  During this call, we will ask if you have developed any symptoms of COVID 19. If you develop any symptoms (ie: fever, flu-like symptoms, shortness of breath, cough etc.) before then, please call [phone].  If you test positive for Covid 19 in the 2 weeks post procedure, please call and report this information to [place].    If any biopsies were taken you will be contacted by phone or by letter within the next 1-3 weeks.  Please call us at [phone] if you have not heard about the biopsies in 3 weeks.    SIGNATURES/CONFIDENTIALITY: You and/or your care partner have signed paperwork which will be entered into your electronic medical record.  These signatures attest to the fact that that the information above on your After Visit Summary has been reviewed and  is understood.  Full responsibility of the confidentiality of this discharge information lies with you and/or your care-partner.

## 2020-05-28 ENCOUNTER — Other Ambulatory Visit: Payer: Self-pay | Admitting: Family

## 2020-05-28 DIAGNOSIS — R059 Cough, unspecified: Secondary | ICD-10-CM

## 2020-05-28 DIAGNOSIS — R0600 Dyspnea, unspecified: Secondary | ICD-10-CM

## 2020-05-29 ENCOUNTER — Telehealth: Payer: Self-pay | Admitting: *Deleted

## 2020-05-29 DIAGNOSIS — T7840XA Allergy, unspecified, initial encounter: Secondary | ICD-10-CM | POA: Insufficient documentation

## 2020-05-29 DIAGNOSIS — F32A Depression, unspecified: Secondary | ICD-10-CM | POA: Insufficient documentation

## 2020-05-29 DIAGNOSIS — H05019 Cellulitis of unspecified orbit: Secondary | ICD-10-CM | POA: Insufficient documentation

## 2020-05-29 DIAGNOSIS — G43909 Migraine, unspecified, not intractable, without status migrainosus: Secondary | ICD-10-CM | POA: Insufficient documentation

## 2020-05-29 DIAGNOSIS — M1990 Unspecified osteoarthritis, unspecified site: Secondary | ICD-10-CM | POA: Insufficient documentation

## 2020-05-29 DIAGNOSIS — G4700 Insomnia, unspecified: Secondary | ICD-10-CM | POA: Insufficient documentation

## 2020-05-29 DIAGNOSIS — G709 Myoneural disorder, unspecified: Secondary | ICD-10-CM | POA: Insufficient documentation

## 2020-05-29 DIAGNOSIS — R011 Cardiac murmur, unspecified: Secondary | ICD-10-CM | POA: Insufficient documentation

## 2020-05-29 DIAGNOSIS — N189 Chronic kidney disease, unspecified: Secondary | ICD-10-CM | POA: Insufficient documentation

## 2020-05-29 DIAGNOSIS — F419 Anxiety disorder, unspecified: Secondary | ICD-10-CM | POA: Insufficient documentation

## 2020-05-29 DIAGNOSIS — Z9221 Personal history of antineoplastic chemotherapy: Secondary | ICD-10-CM | POA: Insufficient documentation

## 2020-05-29 DIAGNOSIS — E785 Hyperlipidemia, unspecified: Secondary | ICD-10-CM | POA: Insufficient documentation

## 2020-05-29 DIAGNOSIS — F429 Obsessive-compulsive disorder, unspecified: Secondary | ICD-10-CM | POA: Insufficient documentation

## 2020-05-29 DIAGNOSIS — G4733 Obstructive sleep apnea (adult) (pediatric): Secondary | ICD-10-CM | POA: Insufficient documentation

## 2020-05-29 DIAGNOSIS — I10 Essential (primary) hypertension: Secondary | ICD-10-CM | POA: Insufficient documentation

## 2020-05-29 NOTE — Telephone Encounter (Signed)
Left message on f/u call 

## 2020-06-01 ENCOUNTER — Ambulatory Visit (INDEPENDENT_AMBULATORY_CARE_PROVIDER_SITE_OTHER): Payer: Managed Care, Other (non HMO) | Admitting: Cardiology

## 2020-06-01 ENCOUNTER — Encounter: Payer: Self-pay | Admitting: Cardiology

## 2020-06-01 ENCOUNTER — Other Ambulatory Visit: Payer: Self-pay

## 2020-06-01 VITALS — BP 148/92 | HR 100 | Ht 66.0 in | Wt 290.0 lb

## 2020-06-01 DIAGNOSIS — E781 Pure hyperglyceridemia: Secondary | ICD-10-CM | POA: Diagnosis not present

## 2020-06-01 DIAGNOSIS — Q231 Congenital insufficiency of aortic valve: Secondary | ICD-10-CM

## 2020-06-01 DIAGNOSIS — E876 Hypokalemia: Secondary | ICD-10-CM

## 2020-06-01 DIAGNOSIS — R0602 Shortness of breath: Secondary | ICD-10-CM

## 2020-06-01 DIAGNOSIS — R931 Abnormal findings on diagnostic imaging of heart and coronary circulation: Secondary | ICD-10-CM

## 2020-06-01 DIAGNOSIS — I10 Essential (primary) hypertension: Secondary | ICD-10-CM | POA: Diagnosis not present

## 2020-06-01 DIAGNOSIS — Z3490 Encounter for supervision of normal pregnancy, unspecified, unspecified trimester: Secondary | ICD-10-CM

## 2020-06-01 DIAGNOSIS — R0789 Other chest pain: Secondary | ICD-10-CM

## 2020-06-01 NOTE — Progress Notes (Signed)
Cardiology Office Note:    Date:  06/01/2020   ID:  Stephanie Miles, DOB 1980/09/06, MRN [id number]  PCP:  Debbrah Alar, NP  Cardiologist:  Jenean Lindau, MD   Referring MD: Debbrah Alar, NP    ASSESSMENT:    1. Aortic regurgitation due to bicuspid aortic valve   2. Essential hypertension   3. Hypertriglyceridemia   4. Severe obesity (BMI >= 40) (HCC)    PLAN:    In order of problems listed above:  1. Primary prevention stressed with the patient. Importance of compliance with diet medication stressed and she vocalized understanding. Importance of regular exercise stressed to her. 2. Cardiac regurgitation with bicuspid aortic valve: Stable at this time from a symptom standpoint we will do echocardiogram to follow-up on this diagnosis. 3. Essential hypertension: Blood pressure is elevated she has not taken her medications this morning. She tells me otherwise her blood pressure is fine. 4. Morbid obesity: Health issues were discussed as well as reducing weight and she promises to do better. Importance of regular exercise stressed. 5. Hyperlipidemia: Recent lipid work was reviewed. Diet was emphasized. I told her to keep it low in carbohydrates and fried fatty foods and she promises to do so. 6. Patient will be seen in follow-up appointment in 6 months or earlier if the patient has any concerns    Medication Adjustments/Labs and Tests Ordered: Current medicines are reviewed at length with the patient today.  Concerns regarding medicines are outlined above.  No orders of the defined types were placed in this encounter.  No orders of the defined types were placed in this encounter.    No chief complaint on file.    History of Present Illness:    Stephanie Miles is a 39 y.o. female. Patient has past medical history of bicuspid aortic valve with aortic regurgitation which is mild, mixed hyperlipidemia with triglyceridemia, essential hypertension  and morbid obesity. She leads a sedentary lifestyle. She denies any chest pain orthopnea or PND. She is a cancer survivor. At the time of my evaluation, the patient is alert awake oriented and in no distress.  Past Medical History:  Diagnosis Date  . Allergy   . ANA positive 12/28/2016  . Anxiety   . Anxiety state 10/16/2013  . Aortic regurgitation due to bicuspid aortic valve   . Arthritis   . Asthma    allergy induced asthma   . Black stools 09/13/2017  . Borderline diabetes 10/20/2016  . Chronic kidney disease    kidney stones  . Cough variant asthma vs uacs  07/07/2015   Spirometry 07/06/2015   Purely restrictive so rec trial off qvar and rx dulera 100 2bid for flare off qvar > no flares on gerd rx as of 08/11/2015      . Depression   . Diabetes type 2, controlled ([place]) 10/20/2016  . Diarrhea 06/20/2017  . Dyspnea 07/07/2015   Spirometry 07/06/2015 purely restrictive   . Elevated LFTs 01/11/2017  . Essential hypertension 06/06/2013   Formatting of this note might be different from the original. Last Assessment & Plan:  Stable off of meds, monitor.  . Eustachian tube dysfunction, bilateral 10/10/2018   Last Assessment & Plan:  Concern over her ears. Chronic history of gradual hearing loss.  No significant history of ear infections, surgery or trauma.  Her ears pop frequently and feel full.  There is some associated bilateral ringing.  She wants to understand is there anything that can be done to improve  this.  Recent audiogram is reviewed and shows a  mild to severe mixed hearing loss. EXAM show  . Family history of breast cancer in female   . Family history of colon cancer   . Fatty liver 11/28/2016  . Fibromyalgia 04/04/2019  . Genetic testing 02/28/2017   Negative genetic testing on the common hereditary cancer panel.  The Hereditary Gene Panel offered by [facility] includes sequencing and/or deletion duplication testing of the following 46 genes: APC, ATM, AXIN2, BARD1, BMPR1A, BRCA1, BRCA2,  BRIP1, CDH1, CDKN2A (p14ARF), CDKN2A (p16INK4a), CHEK2, CTNNA1, DICER1, EPCAM (Deletion/duplication testing only), GREM1 (promoter region deletion/duplication te  . GERD (gastroesophageal reflux disease)   . Heart murmur    bicuspid AV per pt   . Hepatocellular carcinoma ([place]) 01/31/2018  . History of chemotherapy    TACE for liver cancer   . History of kidney stones 10/21/2016  . Hyperlipidemia   . Hypertension   . Hypertriglyceridemia 04/05/2018  . IBS (irritable bowel syndrome)   . Insomnia   . Iron deficiency anemia due to chronic blood loss 03/21/2017  . Left leg injury, initial encounter 02/01/2018  . Leukocytosis 12/06/2016  . Liver cancer ([place]) 03/2017  . Liver lesion 06/20/2017  . Migraine 07/08/2013  . Migraine headache   . Migraine, unspecified, not intractable, without status migrainosus 07/08/2013   Formatting of this note might be different from the original. Last Assessment & Plan:  Stable, continue prn imitrex/zofran.  . 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 NASH (nonalcoholic steatohepatitis) 04/04/2017   Per biopsy 9/18  . Neuromuscular disorder ([place])    very mild neuropathy hands  feet   . OCD (obsessive compulsive disorder)   . Orbital cellulitis    at age 81 was hospitalized for 3 months (almost died)   . OSA (obstructive sleep apnea)   . Osteoarthritis 10/21/2016  . Personal history of tobacco use, presenting hazards to health 01/20/2014  . Primary osteoarthritis of both knees 12/28/2016  . Right shoulder injury, subsequent encounter 02/01/2018  . RUQ abdominal pain 01/11/2017  . S/P appy 05/2013  . Severe obesity (BMI >= 40) ([name]) 07/07/2015   Formatting of this note might be different from the original. Last Assessment & Plan:  Formatting of this note might be different from the original. Body mass index is 40.21 kg/(m^2).  Lab Results  Component Value Date   TSH 0.562 07/09/2013   Contributing to gerd tendency/ doe/reviewed the need  and the process to achieve and maintain neg calorie balance > defer f/u primary care including [facility]  . Sleep apnea    wears cpap   . Tinnitus of both ears 10/11/2018  . Type 2 diabetes mellitus without complication, without long-term current use of insulin ([place]) 10/20/2016   Formatting of this note might be different from the original. Last Assessment & Plan:  Stable, continue diabetic diet.  . Vitamin D deficiency 11/01/2017    Past Surgical History:  Procedure Laterality Date  . APPENDECTOMY    . COLONOSCOPY    . DENTAL SURGERY     wisdom teeth  . ESOPHAGOGASTRODUODENOSCOPY    . LAPAROSCOPIC APPENDECTOMY N/A 06/06/2013   Procedure: APPENDECTOMY LAPAROSCOPIC;  Surgeon: Imogene Burn. Georgette Dover, MD;  Location: [facility];  Service: General;  Laterality: N/A;  . LIVER BIOPSY    . UPPER GASTROINTESTINAL ENDOSCOPY      Current Medications: Current Meds  Medication Sig  . ACCU-CHEK FASTCLIX LANCETS MISC Check sugar twice daily  . ACCU-CHEK  GUIDE test strip USE TO CHECK BLOOD SUGAR TWO TIMES A DAY  . acetaminophen (TYLENOL) 500 MG tablet Take 500-1,000 mg by mouth every 6 (six) hours as needed (for pain).   Marland Kitchen albuterol (VENTOLIN HFA) 108 (90 Base) MCG/ACT inhaler Inhale 2 puffs into the lungs every 6 (six) hours as needed for wheezing or shortness of breath.  . ALPRAZolam (XANAX) 0.5 MG tablet TAKE ONE TABLET BY MOUTH TWICE A DAY AS NEEDED  . benzoyl peroxide-erythromycin (BENZAMYCIN) gel Apply topically 2 (two) times daily.  . Blood Glucose Monitoring Suppl (ACCU-CHEK GUIDE) w/Device KIT 1 each by Does not apply route daily.  . budesonide-formoterol (SYMBICORT) 160-4.5 MCG/ACT inhaler INHALE TWO PUFFS BY MOUTH TWICE A DAY  . buPROPion (WELLBUTRIN XL) 150 MG 24 hr tablet Take 1 tablet (150 mg total) by mouth daily.  . cetirizine (ZYRTEC) 10 MG tablet Take 10 mg by mouth daily.  . Cholecalciferol (VITAMIN D3) 50 MCG (2000 UT) TABS Take 2,000 Units by mouth daily.  . clotrimazole-betamethasone  (LOTRISONE) cream Apply 1 application topically 2 (two) times daily.  Marland Kitchen dicyclomine (BENTYL) 20 MG tablet TAKE ONE TABLET BY MOUTH FOUR TIMES A DAY BEFORE MEALS AND AT BEDTIME  . DULoxetine (CYMBALTA) 60 MG capsule TAKE ONE CAPSULE BY MOUTH DAILY  . Erenumab-aooe (AIMOVIG) 70 MG/ML SOAJ Inject 70 mg into the skin every 30 (thirty) days.  . fluticasone (FLONASE) 50 MCG/ACT nasal spray Place 2 sprays into both nostrils daily.  . furosemide (LASIX) 40 MG tablet TAKE ONE TABLET BY MOUTH EVERY MORNING  . Lactulose 20 GM/30ML SOLN 15 ML by mouth twice daily.  May titrate as needed to 61m three times daily with goal of 2-3 soft BM's/day  . losartan (COZAAR) 100 MG tablet Take 1 tablet (100 mg total) by mouth daily.  . metFORMIN (GLUCOPHAGE-XR) 500 MG 24 hr tablet TAKE TWO TABLETS BY MOUTH DAILY WITH BREAKFAST  . metoprolol succinate (TOPROL-XL) 100 MG 24 hr tablet TAKE ONE TABLET BY MOUTH DAILY OR IMMEDIATELY FOLLOWING A MEAL AS DIRECTED  . montelukast (SINGULAIR) 10 MG tablet TAKE ONE TABLET BY MOUTH EVERY NIGHT AT BEDTIME  . Multiple Vitamins-Minerals (MULTIVITAMIN WITH MINERALS) tablet Take 1 tablet by mouth daily.  .Marland Kitchennystatin (NYSTATIN) powder Apply topically 2 (two) times daily.  .Marland Kitchenomeprazole (PRILOSEC) 20 MG capsule TAKE TWO CAPSULES BY MOUTH IN THE MORNING BEFORE BREAKFAST AND TAKE ONE CAPSULE BY MOUTH IN THE EVENING BEFORE SUPPER  . ondansetron (ZOFRAN ODT) 4 MG disintegrating tablet Take 1 tablet (4 mg total) by mouth every 8 (eight) hours as needed for nausea or vomiting.  . potassium chloride (KLOR-CON) 10 MEQ tablet TAKE ONE TABLET BY MOUTH EVERY OTHER DAY  . Prenatal Vit-Fe Fumarate-FA (MULTIVITAMIN-PRENATAL) 27-0.8 MG TABS tablet Take 1 tablet by mouth daily.   .Marland KitchenUbrogepant (UBRELVY) 100 MG TABS Take 1 tablet by mouth as needed (May repeat in 2 hours.  Maximum 2 tablets in 24 hours).     Allergies:   Dust mite extract, Mold extract [trichophyton], Nsaids, Codeine, Amlodipine, and Tape    Social History   Socioeconomic History  . Marital status: Single    Spouse name: Not on file  . Number of children: 0  . Years of education: Not on file  . Highest education level: Not on file  Occupational History  . Occupation: SShip broker . Occupation: MProgrammer, applications Tobacco Use  . Smoking status: Former Smoker    Packs/day: 1.00    Years: 20.00  Pack years: 20.00    Types: Cigarettes    Quit date: 02/22/2013    Years since quitting: 7.2  . Smokeless tobacco: Never Used  Vaping Use  . Vaping Use: Never used  Substance and Sexual Activity  . Alcohol use: Yes    Alcohol/week: 0.0 standard drinks    Comment: occ  . Drug use: No  . Sexual activity: Not Currently  Other Topics Concern  . Not on file  Social History Narrative   Working at [employer] doing [employer] alone (has a Neurosurgeon)   Working on a degree at [employer], will plan to return fall 2018   Enjoys spending time with family.   Friends and family in [place] Determinants of Health   Financial Resource Strain:   . Difficulty of Paying Living Expenses: Not on file  Food Insecurity:   . Worried About Charity fundraiser in the Last Year: Not on file  . Ran Out of Food in the Last Year: Not on file  Transportation Needs:   . Lack of Transportation (Medical): Not on file  . Lack of Transportation (Non-Medical): Not on file  Physical Activity:   . Days of Exercise per Week: Not on file  . Minutes of Exercise per Session: Not on file  Stress:   . Feeling of Stress : Not on file  Social Connections:   . Frequency of Communication with Friends and Family: Not on file  . Frequency of Social Gatherings with Friends and Family: Not on file  . Attends Religious Services: Not on file  . Active Member of Clubs or Organizations: Not on file  . Attends Archivist Meetings: Not on file  . Marital Status: Not on file     Family History: The patient's family history includes AAA (abdominal  aortic aneurysm) in her paternal grandmother; Asthma in her mother and sister; Breast cancer in an other family member; Breast cancer (age of onset: 51) in her father; Cancer in an other family member; Cervical cancer in her maternal aunt; Colon cancer (age of onset: 41) in her maternal grandmother; Colon cancer (age of onset: 68) in an other family member; Colon polyps in her maternal grandfather; Diabetes in her father; Heart disease in her father and paternal grandfather; Heart disease (age of onset: 39) in her paternal uncle; Hyperlipidemia in her brother and father; Hypertension in her brother, father, and mother; Liver cancer in her maternal grandmother; Melanoma (age of onset: 57) in her maternal grandfather; Stomach cancer (age of onset: 68) in an other family member; Stroke (age of onset: 3) in her paternal uncle; Vision loss in her brother. There is no history of Esophageal cancer, Pancreatic cancer, or Rectal cancer.  ROS:   Please see the history of present illness.    All other systems reviewed and are negative.  EKGs/Labs/Other Studies Reviewed:    The following studies were reviewed today: I reviewed echo report from the past with the patient at length   Recent Labs: 01/06/2020: ALT 37; TSH 1.51 02/14/2020: BUN 10; Creat 0.89; Hemoglobin 14.4; Platelets 294; Potassium 3.7; Sodium 138  Recent Lipid Panel    Component Value Date/Time   CHOL 219 (H) 01/06/2020 0802   TRIG 295.0 (H) 01/06/2020 0802   HDL 33.10 (L) 01/06/2020 0802   CHOLHDL 7 01/06/2020 0802   VLDL 59.0 (H) 01/06/2020 0802   LDLDIRECT 150.0 01/06/2020 0802    Physical Exam:    VS:  BP (!) 148/92   Pulse 100   Ht _0  (1.676 m)   Wt 290 lb 0.6 oz (131.6 kg)   LMP 05/13/2020 (Exact Date)   SpO2 97%   BMI 46.81 kg/m     Wt Readings from Last 3 Encounters:  06/01/20 290 lb 0.6 oz (131.6 kg)  05/27/20 286 lb (129.7 kg)  05/14/20 286 lb (129.7 kg)     GEN: Patient is in no acute distress HEENT:  Normal NECK: No JVD; No carotid bruits LYMPHATICS: No lymphadenopathy CARDIAC: Hear sounds regular, 2/6 systolic murmur at the apex. RESPIRATORY:  Clear to auscultation without rales, wheezing or rhonchi  ABDOMEN: Soft, non-tender, non-distended MUSCULOSKELETAL:  No edema; No deformity  SKIN: Warm and dry NEUROLOGIC:  Alert and oriented x 3 PSYCHIATRIC:  Normal affect   Signed, Jenean Lindau, MD  06/01/2020 9:37 AM    [facility]

## 2020-06-01 NOTE — Patient Instructions (Signed)
Medication Instructions:  No medication changes. *If you need a refill on your cardiac medications before your next appointment, please call your pharmacy*   Lab Work: None ordered If you have labs (blood work) drawn today and your tests are completely normal, you will receive your results only by: Marland Kitchen MyChart Message (if you have MyChart) OR . A paper copy in the mail If you have any lab test that is abnormal or we need to change your treatment, we will call you to review the results.   Testing/Procedures: Your physician has requested that you have an echocardiogram. Echocardiography is a painless test that uses sound waves to create images of your heart. It provides your doctor with information about the size and shape of your heart and how well your heart's chambers and valves are working. This procedure takes approximately one hour. There are no restrictions for this procedure.     Follow-Up: At [facility], you and your health needs are our priority.  As part of our continuing mission to provide you with exceptional heart care, we have created designated Provider Care Teams.  These Care Teams include your primary Cardiologist (physician) and Advanced Practice Providers (APPs -  Physician Assistants and Nurse Practitioners) who all work together to provide you with the care you need, when you need it.  We recommend signing up for the patient portal called "MyChart".  Sign up information is provided on this After Visit Summary.  MyChart is used to connect with patients for Virtual Visits (Telemedicine).  Patients are able to view lab/test results, encounter notes, upcoming appointments, etc.  Non-urgent messages can be sent to your provider as well.   To learn more about what you can do with MyChart, go to [email or web address].    Your next appointment:   6 month(s)  The format for your next appointment:   In Person  Provider:   Jyl Heinz, MD   Other  Instructions  Echocardiogram An echocardiogram is a procedure that uses painless sound waves (ultrasound) to produce an image of the heart. Images from an echocardiogram can provide important information about:  Signs of coronary artery disease (CAD).  Aneurysm detection. An aneurysm is a weak or damaged part of an artery wall that bulges out from the normal force of blood pumping through the body.  Heart size and shape. Changes in the size or shape of the heart can be associated with certain conditions, including heart failure, aneurysm, and CAD.  Heart muscle function.  Heart valve function.  Signs of a past heart attack.  Fluid buildup around the heart.  Thickening of the heart muscle.  A tumor or infectious growth around the heart valves. Tell a health care provider about:  Any allergies you have.  All medicines you are taking, including vitamins, herbs, eye drops, creams, and over-the-counter medicines.  Any blood disorders you have.  Any surgeries you have had.  Any medical conditions you have.  Whether you are pregnant or may be pregnant. What are the risks? Generally, this is a safe procedure. However, problems may occur, including:  Allergic reaction to dye (contrast) that may be used during the procedure. What happens before the procedure? No specific preparation is needed. You may eat and drink normally. What happens during the procedure?   An IV tube may be inserted into one of your veins.  You may receive contrast through this tube. A contrast is an injection that improves the quality of the pictures from your heart.  A  gel will be applied to your chest.  A wand-like tool (transducer) will be moved over your chest. The gel will help to transmit the sound waves from the transducer.  The sound waves will harmlessly bounce off of your heart to allow the heart images to be captured in real-time motion. The images will be recorded on a computer. The  procedure may vary among health care providers and hospitals. What happens after the procedure?  You may return to your normal, everyday life, including diet, activities, and medicines, unless your health care provider tells you not to do that. Summary  An echocardiogram is a procedure that uses painless sound waves (ultrasound) to produce an image of the heart.  Images from an echocardiogram can provide important information about the size and shape of your heart, heart muscle function, heart valve function, and fluid buildup around your heart.  You do not need to do anything to prepare before this procedure. You may eat and drink normally.  After the echocardiogram is completed, you may return to your normal, everyday life, unless your health care provider tells you not to do that. This information is not intended to replace advice given to you by your health care provider. Make sure you discuss any questions you have with your health care provider. Document Revised: 11/01/2018 Document Reviewed: 08/13/2016 Elsevier Patient Education  [place].

## 2020-06-10 ENCOUNTER — Encounter: Payer: Self-pay | Admitting: Gastroenterology

## 2020-06-26 ENCOUNTER — Ambulatory Visit (HOSPITAL_BASED_OUTPATIENT_CLINIC_OR_DEPARTMENT_OTHER)
Admission: RE | Admit: 2020-06-26 | Discharge: 2020-06-26 | Disposition: A | Discharge status: Home / Self Care | Payer: Managed Care, Other (non HMO) | Source: Ambulatory Visit | Attending: Cardiology | Admitting: Cardiology

## 2020-06-26 ENCOUNTER — Other Ambulatory Visit: Payer: Self-pay

## 2020-06-26 DIAGNOSIS — Q231 Congenital insufficiency of aortic valve: Secondary | ICD-10-CM | POA: Diagnosis not present

## 2020-06-26 LAB — ECHOCARDIOGRAM COMPLETE
Area-P 1/2: 3.34 cm2
P 1/2 time: 470 msec
S' Lateral: 2.56 cm

## 2020-06-29 ENCOUNTER — Other Ambulatory Visit: Payer: Self-pay

## 2020-06-29 ENCOUNTER — Other Ambulatory Visit (HOSPITAL_COMMUNITY)
Admission: RE | Admit: 2020-06-29 | Discharge: 2020-06-29 | Disposition: A | Discharge status: Home / Self Care | Payer: Managed Care, Other (non HMO) | Source: Ambulatory Visit | Attending: Cardiology | Admitting: Cardiology

## 2020-06-29 ENCOUNTER — Ambulatory Visit (HOSPITAL_COMMUNITY)
Admission: RE | Admit: 2020-06-29 | Discharge: 2020-06-29 | Disposition: A | Discharge status: Home / Self Care | Payer: Managed Care, Other (non HMO) | Source: Ambulatory Visit | Attending: Cardiology | Admitting: Cardiology

## 2020-06-29 DIAGNOSIS — R0789 Other chest pain: Secondary | ICD-10-CM | POA: Diagnosis present

## 2020-06-29 DIAGNOSIS — R0602 Shortness of breath: Secondary | ICD-10-CM | POA: Diagnosis present

## 2020-06-29 DIAGNOSIS — R931 Abnormal findings on diagnostic imaging of heart and coronary circulation: Secondary | ICD-10-CM | POA: Diagnosis present

## 2020-06-29 LAB — BASIC METABOLIC PANEL
Anion gap: 9 (ref 5–15)
BUN: 9 mg/dL (ref 6–20)
CO2: 29 mmol/L (ref 22–32)
Calcium: 8.9 mg/dL (ref 8.9–10.3)
Chloride: 100 mmol/L (ref 98–111)
Creatinine, Ser: 0.88 mg/dL (ref 0.44–1.00)
GFR, Estimated: >60 mL/min (ref >60)
Glucose, Bld: 124 mg/dL — ABNORMAL HIGH (ref 70–99)
Potassium: 3.4 mmol/L — ABNORMAL LOW (ref 3.5–5.1)
Sodium: 138 mmol/L (ref 135–145)

## 2020-06-29 LAB — POCT I-STAT CREATININE: Creatinine, Ser: 0.8 mg/dL (ref 0.44–1.00)

## 2020-06-29 LAB — PREGNANCY, URINE: Preg Test, Ur: NEGATIVE

## 2020-06-29 MED ORDER — IOHEXOL 350 MG/ML SOLN
100.0000 mL | Freq: Once | INTRAVENOUS | Status: AC | PRN
  Administered 2020-06-29: 100 mL via INTRAVENOUS

## 2020-06-29 NOTE — Addendum Note (Signed)
Addended by: Truddie Hidden on: 06/29/2020 02:03 PM   Modules accepted: Orders

## 2020-06-29 NOTE — Addendum Note (Signed)
Addended by: Truddie Hidden on: 06/29/2020 02:09 PM   Modules accepted: Orders

## 2020-06-30 ENCOUNTER — Telehealth: Payer: Self-pay

## 2020-06-30 NOTE — Telephone Encounter (Signed)
Results reviewed with pt as per Dr. Julien Nordmann note.  Pt verbalized understanding and had no additional questions. Routed to PCP.

## 2020-07-01 ENCOUNTER — Other Ambulatory Visit: Payer: Self-pay | Admitting: Family

## 2020-07-02 NOTE — Addendum Note (Signed)
Addended by: Truddie Hidden on: 07/02/2020 08:11 AM   Modules accepted: Orders

## 2020-07-07 LAB — BASIC METABOLIC PANEL
BUN/Creatinine Ratio: 9 (ref 9–23)
BUN: 8 mg/dL (ref 6–24)
CO2: 23 mmol/L (ref 20–29)
Calcium: 9 mg/dL (ref 8.7–10.2)
Chloride: 100 mmol/L (ref 96–106)
Creatinine, Ser: 0.85 mg/dL (ref 0.57–1.00)
GFR calc Af Amer: 98 mL/min/{1.73_m2} (ref >59)
GFR calc non Af Amer: 85 mL/min/{1.73_m2} (ref >59)
Glucose: 160 mg/dL — ABNORMAL HIGH (ref 65–99)
Potassium: 3.8 mmol/L (ref 3.5–5.2)
Sodium: 139 mmol/L (ref 134–144)

## 2020-07-27 LAB — HM DIABETES EYE EXAM

## 2020-08-07 ENCOUNTER — Other Ambulatory Visit: Payer: Self-pay | Admitting: Family

## 2020-08-07 ENCOUNTER — Other Ambulatory Visit: Payer: Self-pay | Admitting: Neurology

## 2020-08-07 DIAGNOSIS — K580 Irritable bowel syndrome with diarrhea: Secondary | ICD-10-CM

## 2020-08-07 NOTE — Telephone Encounter (Signed)
Requesting: alprazolam 0.66m  Contract: 04/27/2018 UDS: 04/27/2018 Last Visit: 02/14/2020 Next Visit: None Last Refill: 07/02/2020 #45 and 0RF  Please Advise

## 2020-08-11 ENCOUNTER — Encounter: Payer: Self-pay | Admitting: Neurology

## 2020-08-11 NOTE — Progress Notes (Signed)
Stephanie Miles (Key: [id number]) Rx #: [id number] Roselyn Meier 100MG tablets   Form Librarian, academic [place] Form [phone] NCPDP) Created 2 days ago Sent to Plan 43 minutes ago Plan Response 43 minutes ago Submit Clinical Questions 40 minutes ago Determination Favorable 40 minutes ago Message from Plan [id number];UDTHYH:[id number];Review Type:Prior Auth;Coverage Start Date:08/11/2020;Coverage End Date:08/11/2021;

## 2020-08-13 ENCOUNTER — Ambulatory Visit: Payer: Managed Care, Other (non HMO) | Admitting: Family

## 2020-08-17 ENCOUNTER — Encounter: Payer: Self-pay | Admitting: Family

## 2020-08-18 ENCOUNTER — Encounter: Payer: Self-pay | Admitting: Family

## 2020-08-21 ENCOUNTER — Ambulatory Visit: Payer: Managed Care, Other (non HMO) | Admitting: Family

## 2020-09-02 ENCOUNTER — Emergency Department (HOSPITAL_BASED_OUTPATIENT_CLINIC_OR_DEPARTMENT_OTHER)
Admission: EM | Admit: 2020-09-02 | Discharge: 2020-09-02 | Disposition: A | Discharge status: Home / Self Care | Payer: Managed Care, Other (non HMO) | Attending: Emergency Medicine | Admitting: Emergency Medicine

## 2020-09-02 ENCOUNTER — Ambulatory Visit: Payer: Managed Care, Other (non HMO) | Admitting: Family

## 2020-09-02 ENCOUNTER — Encounter (HOSPITAL_BASED_OUTPATIENT_CLINIC_OR_DEPARTMENT_OTHER): Payer: Self-pay

## 2020-09-02 ENCOUNTER — Other Ambulatory Visit: Payer: Self-pay

## 2020-09-02 ENCOUNTER — Emergency Department (HOSPITAL_BASED_OUTPATIENT_CLINIC_OR_DEPARTMENT_OTHER): Payer: Managed Care, Other (non HMO)

## 2020-09-02 DIAGNOSIS — Z9221 Personal history of antineoplastic chemotherapy: Secondary | ICD-10-CM | POA: Insufficient documentation

## 2020-09-02 DIAGNOSIS — E785 Hyperlipidemia, unspecified: Secondary | ICD-10-CM | POA: Insufficient documentation

## 2020-09-02 DIAGNOSIS — Z79899 Other long term (current) drug therapy: Secondary | ICD-10-CM | POA: Insufficient documentation

## 2020-09-02 DIAGNOSIS — Z87891 Personal history of nicotine dependence: Secondary | ICD-10-CM | POA: Insufficient documentation

## 2020-09-02 DIAGNOSIS — I129 Hypertensive chronic kidney disease with stage 1 through stage 4 chronic kidney disease, or unspecified chronic kidney disease: Secondary | ICD-10-CM | POA: Insufficient documentation

## 2020-09-02 DIAGNOSIS — Z7984 Long term (current) use of oral hypoglycemic drugs: Secondary | ICD-10-CM | POA: Diagnosis not present

## 2020-09-02 DIAGNOSIS — Z7951 Long term (current) use of inhaled steroids: Secondary | ICD-10-CM | POA: Diagnosis not present

## 2020-09-02 DIAGNOSIS — E1169 Type 2 diabetes mellitus with other specified complication: Secondary | ICD-10-CM | POA: Diagnosis not present

## 2020-09-02 DIAGNOSIS — R0789 Other chest pain: Secondary | ICD-10-CM | POA: Diagnosis not present

## 2020-09-02 DIAGNOSIS — Z8505 Personal history of malignant neoplasm of liver: Secondary | ICD-10-CM | POA: Diagnosis not present

## 2020-09-02 DIAGNOSIS — E1122 Type 2 diabetes mellitus with diabetic chronic kidney disease: Secondary | ICD-10-CM | POA: Diagnosis not present

## 2020-09-02 DIAGNOSIS — N189 Chronic kidney disease, unspecified: Secondary | ICD-10-CM | POA: Insufficient documentation

## 2020-09-02 DIAGNOSIS — J45909 Unspecified asthma, uncomplicated: Secondary | ICD-10-CM | POA: Diagnosis not present

## 2020-09-02 LAB — HEPATIC FUNCTION PANEL
ALT: 56 U/L — ABNORMAL HIGH (ref 0–44)
AST: 42 U/L — ABNORMAL HIGH (ref 15–41)
Albumin: 4.1 g/dL (ref 3.5–5.0)
Alkaline Phosphatase: 94 U/L (ref 38–126)
Bilirubin, Direct: <0.1 mg/dL (ref 0.0–0.2)
Total Bilirubin: 0.5 mg/dL (ref 0.3–1.2)
Total Protein: 7.5 g/dL (ref 6.5–8.1)

## 2020-09-02 LAB — BASIC METABOLIC PANEL
Anion gap: 11 (ref 5–15)
BUN: 10 mg/dL (ref 6–20)
CO2: 24 mmol/L (ref 22–32)
Calcium: 8.8 mg/dL — ABNORMAL LOW (ref 8.9–10.3)
Chloride: 100 mmol/L (ref 98–111)
Creatinine, Ser: 0.81 mg/dL (ref 0.44–1.00)
GFR, Estimated: >60 mL/min (ref >60)
Glucose, Bld: 175 mg/dL — ABNORMAL HIGH (ref 70–99)
Potassium: 3.4 mmol/L — ABNORMAL LOW (ref 3.5–5.1)
Sodium: 135 mmol/L (ref 135–145)

## 2020-09-02 LAB — LIPASE, BLOOD: Lipase: 33 U/L (ref 11–51)

## 2020-09-02 LAB — PROTIME-INR
INR: 1 (ref 0.8–1.2)
Prothrombin Time: 13.1 seconds (ref 11.4–15.2)

## 2020-09-02 LAB — CBC
HCT: 43.4 % (ref 36.0–46.0)
Hemoglobin: 14.9 g/dL (ref 12.0–15.0)
MCH: 29.7 pg (ref 26.0–34.0)
MCHC: 34.3 g/dL (ref 30.0–36.0)
MCV: 86.5 fL (ref 80.0–100.0)
Platelets: 282 10*3/uL (ref 150–400)
RBC: 5.02 MIL/uL (ref 3.87–5.11)
RDW: 13.2 % (ref 11.5–15.5)
WBC: 10.8 10*3/uL — ABNORMAL HIGH (ref 4.0–10.5)
nRBC: 0 % (ref 0.0–0.2)

## 2020-09-02 LAB — TROPONIN I (HIGH SENSITIVITY)
Troponin I (High Sensitivity): 3 ng/L (ref <18)
Troponin I (High Sensitivity): 3 ng/L (ref <18)

## 2020-09-02 LAB — D-DIMER, QUANTITATIVE: D-Dimer, Quant: 0.27 ug/mL-FEU (ref 0.00–0.50)

## 2020-09-02 LAB — BRAIN NATRIURETIC PEPTIDE: B Natriuretic Peptide: 10.8 pg/mL (ref 0.0–100.0)

## 2020-09-02 LAB — PREGNANCY, URINE: Preg Test, Ur: NEGATIVE

## 2020-09-02 NOTE — ED Triage Notes (Signed)
Pt c/o CP x 30 min-NAD-steady gait

## 2020-09-02 NOTE — ED Provider Notes (Signed)
[facility] EMERGENCY DEPARTMENT Provider Note   CSN: [id number] Arrival date & time: 09/02/20  1217     History Chief Complaint  Patient presents with  . Chest Pain    Stephanie Miles is a 40 y.o. female with extensive past medical history as documented below including obesity with a BMI over 40, hypertension, aortic regurgitation due to bicuspid aortic valve, hepatocellular carcinoma status post transarterial chemoembolization at [facility] currently followed by [facility] transplant team for anticipation of future need for transplant, fibromyalgia, OCD, hypertension, hyperlipidemia, migraines, who presents today for evaluation of chest pain that started at 1150 this morning.  She states her pain is sharp however feels like a pressure in the middle of her chest.  She states it radiates into both sides of her jaw.  She had a  covid exposure then about 2 days later had flulike symptoms starting on 08/15/2020 including fatigue, myalgias, "low grade fever."  Her Covid test at that time was negative and she is both vaccinated and boosters.  She reports that she does not feel short of breath.  No current fevers, N/V/D.  No diaphoresis.   Currently her pain is a 5 out of 10.  At worst it was an 8 out of 10.  She reports compliance with all of her medications, denies any missing meds or recent dose changes.  She reports no abdominal pain. HPI     Past Medical History:  Diagnosis Date  . Allergy   . ANA positive 12/28/2016  . Anxiety   . Anxiety state 10/16/2013  . Aortic regurgitation due to bicuspid aortic valve   . Arthritis   . Asthma    allergy induced asthma   . Black stools 09/13/2017  . Borderline diabetes 10/20/2016  . Chronic kidney disease    kidney stones  . Cough variant asthma vs uacs  07/07/2015   Spirometry 07/06/2015   Purely restrictive so rec trial off qvar and rx dulera 100 2bid for flare off qvar > no flares on gerd rx as of 08/11/2015      . Depression   . Diabetes  type 2, controlled ([place]) 10/20/2016  . Diarrhea 06/20/2017  . Dyspnea 07/07/2015   Spirometry 07/06/2015 purely restrictive   . Elevated LFTs 01/11/2017  . Essential hypertension 06/06/2013   Formatting of this note might be different from the original. Last Assessment & Plan:  Stable off of meds, monitor.  . Eustachian tube dysfunction, bilateral 10/10/2018   Last Assessment & Plan:  Concern over her ears. Chronic history of gradual hearing loss.  No significant history of ear infections, surgery or trauma.  Her ears pop frequently and feel full.  There is some associated bilateral ringing.  She wants to understand is there anything that can be done to improve this.  Recent audiogram is reviewed and shows a  mild to severe mixed hearing loss. EXAM show  . Family history of breast cancer in female   . Family history of colon cancer   . Fatty liver 11/28/2016  . Fibromyalgia 04/04/2019  . Genetic testing 02/28/2017   Negative genetic testing on the common hereditary cancer panel.  The Hereditary Gene Panel offered by [facility] includes sequencing and/or deletion duplication testing of the following 46 genes: APC, ATM, AXIN2, BARD1, BMPR1A, BRCA1, BRCA2, BRIP1, CDH1, CDKN2A (p14ARF), CDKN2A (p16INK4a), CHEK2, CTNNA1, DICER1, EPCAM (Deletion/duplication testing only), GREM1 (promoter region deletion/duplication te  . GERD (gastroesophageal reflux disease)   . Heart murmur    bicuspid AV  per pt   . Hepatocellular carcinoma ([place]) 01/31/2018  . History of chemotherapy    TACE for liver cancer   . History of kidney stones 10/21/2016  . Hyperlipidemia   . Hypertension   . Hypertriglyceridemia 04/05/2018  . IBS (irritable bowel syndrome)   . Insomnia   . Iron deficiency anemia due to chronic blood loss 03/21/2017  . Left leg injury, initial encounter 02/01/2018  . Leukocytosis 12/06/2016  . Liver cancer ([place]) 03/2017  . Liver lesion 06/20/2017  . Migraine 07/08/2013  . Migraine headache   . Migraine,  unspecified, not intractable, without status migrainosus 07/08/2013   Formatting of this note might be different from the original. Last Assessment & Plan:  Stable, continue prn imitrex/zofran.  . 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 NASH (nonalcoholic steatohepatitis) 04/04/2017   Per biopsy 9/18  . Neuromuscular disorder ([place])    very mild neuropathy hands  feet   . OCD (obsessive compulsive disorder)   . Orbital cellulitis    at age 7 was hospitalized for 3 months (almost died)   . OSA (obstructive sleep apnea)   . Osteoarthritis 10/21/2016  . Personal history of tobacco use, presenting hazards to health 01/20/2014  . Primary osteoarthritis of both knees 12/28/2016  . Right shoulder injury, subsequent encounter 02/01/2018  . RUQ abdominal pain 01/11/2017  . S/P appy 05/2013  . Severe obesity (BMI >= 40) ([place]) 07/07/2015   Formatting of this note might be different from the original. Last Assessment & Plan:  Formatting of this note might be different from the original. Body mass index is 40.21 kg/(m^2).  Lab Results  Component Value Date   TSH 0.562 07/09/2013   Contributing to gerd tendency/ doe/reviewed the need and the process to achieve and maintain neg calorie balance > defer f/u primary care including [facility]  . Sleep apnea    wears cpap   . Tinnitus of both ears 10/11/2018  . Type 2 diabetes mellitus without complication, without long-term current use of insulin ([place]) 10/20/2016   Formatting of this note might be different from the original. Last Assessment & Plan:  Stable, continue diabetic diet.  . Vitamin D deficiency 11/01/2017    Patient Active Problem List   Diagnosis Date Noted  . Allergy   . Anxiety   . Arthritis   . Chronic kidney disease   . Depression   . Heart murmur   . History of chemotherapy   . Hyperlipidemia   . Hypertension   . Insomnia   . Migraine headache   . Neuromuscular disorder ([place])   . OCD (obsessive compulsive  disorder)   . Orbital cellulitis   . OSA (obstructive sleep apnea)   . Fibromyalgia 04/04/2019  . 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 Tinnitus of both ears 10/11/2018  . Eustachian tube dysfunction, bilateral 10/10/2018  . Hypertriglyceridemia 04/05/2018  . Right shoulder injury, subsequent encounter 02/01/2018  . Left leg injury, initial encounter 02/01/2018  . Hepatocellular carcinoma ([place]) 01/31/2018  . Vitamin D deficiency 11/01/2017  . Black stools 09/13/2017  . Liver lesion 06/20/2017  . Diarrhea 06/20/2017  . NASH (nonalcoholic steatohepatitis) 04/04/2017  . Liver cancer ([place]) 03/2017  . Iron deficiency anemia due to chronic blood loss 03/21/2017  . Genetic testing 02/28/2017  . Family history of breast cancer in female   . Family history of colon cancer   . Elevated LFTs 01/11/2017  . RUQ abdominal pain  01/11/2017  . PTSD (post-traumatic stress disorder) 12/28/2016  . Primary osteoarthritis of both knees 12/28/2016  . ANA positive 12/28/2016  . Leukocytosis 12/06/2016  . Fatty liver 11/28/2016  . History of kidney stones 10/21/2016  . Osteoarthritis 10/21/2016  . Type 2 diabetes mellitus without complication, without long-term current use of insulin ([place]) 10/20/2016  . Borderline diabetes 10/20/2016  . Cough variant asthma vs uacs  07/07/2015  . Severe obesity (BMI >= 40) ([place]) 07/07/2015  . Dyspnea 07/07/2015  . Asthma 05/27/2015  . Aortic regurgitation due to bicuspid aortic valve 03/12/2014  . Personal history of tobacco use, presenting hazards to health 01/20/2014  . Anxiety state 10/16/2013  . Migraine 07/08/2013  . Sleep apnea 07/08/2013  . Migraine, unspecified, not intractable, without status migrainosus 07/08/2013  . IBS (irritable bowel syndrome) 06/06/2013  . GERD (gastroesophageal reflux disease) 06/06/2013  . Essential hypertension 06/06/2013  . S/P appy 05/2013    Past Surgical History:  Procedure Laterality  Date  . APPENDECTOMY    . COLONOSCOPY    . DENTAL SURGERY     wisdom teeth  . ESOPHAGOGASTRODUODENOSCOPY    . LAPAROSCOPIC APPENDECTOMY N/A 06/06/2013   Procedure: APPENDECTOMY LAPAROSCOPIC;  Surgeon: Imogene Burn. Georgette Dover, MD;  Location: [facility];  Service: General;  Laterality: N/A;  . LIVER BIOPSY    . UPPER GASTROINTESTINAL ENDOSCOPY       OB History   No obstetric history on file.     Family History  Problem Relation Age of Onset  . Asthma Mother   . Hypertension Mother   . Breast cancer Father 61  . Diabetes Father   . Hypertension Father   . Heart disease Father   . Hyperlipidemia Father   . Asthma Sister   . Hyperlipidemia Brother   . Hypertension Brother   . Vision loss Brother   . Colon cancer Maternal Grandmother 54  . Liver cancer Maternal Grandmother   . Cervical cancer Maternal Aunt   . Stroke Paternal Uncle 38  . Melanoma Maternal Grandfather 44  . Colon polyps Maternal Grandfather   . AAA (abdominal aortic aneurysm) Paternal Grandmother   . Heart disease Paternal Grandfather   . Heart disease Paternal Uncle 55  . Colon cancer Other 45       MGM's mother  . Stomach cancer Other 9       MGM's mother  . Breast cancer Other        MGM's maternal aunt  . Cancer Other        MGM's maternal uncle with GI cancer  . Esophageal cancer Neg Hx   . Pancreatic cancer Neg Hx   . Rectal cancer Neg Hx     Social History   Tobacco Use  . Smoking status: Former Smoker    Packs/day: 1.00    Years: 20.00    Pack years: 20.00    Types: Cigarettes    Quit date: 02/22/2013    Years since quitting: 7.5  . Smokeless tobacco: Never Used  Vaping Use  . Vaping Use: Never used  Substance Use Topics  . Alcohol use: Yes    Comment: occ  . Drug use: No    Home Medications Prior to Admission medications   Medication Sig Start Date End Date Taking? Authorizing Provider  ACCU-CHEK FASTCLIX LANCETS MISC Check sugar twice daily 06/20/18   Debbrah Alar, NP  ACCU-CHEK  GUIDE test strip USE TO CHECK BLOOD SUGAR TWO TIMES A DAY 12/26/18   Debbrah Alar, NP  acetaminophen (TYLENOL) 500 MG tablet Take 500-1,000 mg by mouth every 6 (six) hours as needed (for pain).     [provider]  AIMOVIG 70 MG/ML SOAJ INJECT 70MG INTO THE SKIN ONCE EVERY 30 DAYS 08/07/20   Tomi Likens, Adam R, DO  albuterol (VENTOLIN HFA) 108 (90 Base) MCG/ACT inhaler Inhale 2 puffs into the lungs every 6 (six) hours as needed for wheezing or shortness of breath.    [provider]  ALPRAZolam Duanne Moron) 0.5 MG tablet TAKE ONE TABLET BY MOUTH TWICE A DAY AS NEEDED 08/07/20   Debbrah Alar, NP  benzoyl peroxide-erythromycin [facility]) gel Apply topically 2 (two) times daily. 01/06/20   Debbrah Alar, NP  Blood Glucose Monitoring Suppl (ACCU-CHEK GUIDE) w/Device KIT 1 each by Does not apply route daily. 01/09/17   Debbrah Alar, NP  budesonide-formoterol (SYMBICORT) 160-4.5 MCG/ACT inhaler INHALE TWO PUFFS BY MOUTH TWICE A DAY 04/21/20   Debbrah Alar, NP  buPROPion (WELLBUTRIN XL) 150 MG 24 hr tablet Take 1 tablet (150 mg total) by mouth daily. 02/19/20   Debbrah Alar, NP  cetirizine (ZYRTEC) 10 MG tablet Take 10 mg by mouth daily.    [provider]  Cholecalciferol (VITAMIN D3) 50 MCG (2000 UT) TABS Take 2,000 Units by mouth daily.    [provider]  clotrimazole-betamethasone (LOTRISONE) cream Apply 1 application topically 2 (two) times daily. 02/26/19   Debbrah Alar, NP  dicyclomine (BENTYL) 20 MG tablet TAKE ONE TABLET BY MOUTH FOUR TIMES A DAY BEFORE MEALS AND AT BEDTIME AS DIRECTED 08/07/20   Debbrah Alar, NP  DULoxetine (CYMBALTA) 60 MG capsule TAKE ONE CAPSULE BY MOUTH DAILY 10/03/19   Debbrah Alar, NP  fluticasone (FLONASE) 50 MCG/ACT nasal spray Place 2 sprays into both nostrils daily. 04/02/20   Saguier, Percell Miller, PA-C  furosemide (LASIX) 40 MG tablet TAKE ONE TABLET BY MOUTH EVERY MORNING 01/15/20   Debbrah Alar, NP  Lactulose 20 GM/30ML SOLN 15 ML by mouth twice daily.  May titrate as needed to 68m three times daily with goal of 2-3 soft BM's/day 05/09/19   ODebbrah Alar NP  losartan (COZAAR) 100 MG tablet Take 1 tablet (100 mg total) by mouth daily. 10/03/19   ODebbrah Alar NP  metFORMIN (GLUCOPHAGE-XR) 500 MG 24 hr tablet TAKE TWO TABLETS BY MOUTH DAILY WITH BREAKFAST 01/15/20   ODebbrah Alar NP  metoprolol succinate (TOPROL-XL) 100 MG 24 hr tablet TAKE ONE TABLET BY MOUTH DAILY OR IMMEDIATELY FOLLOWING A MEAL AS DIRECTED 01/15/20   ODebbrah Alar NP  montelukast (SINGULAIR) 10 MG tablet TAKE ONE TABLET BY MOUTH EVERY NIGHT AT BEDTIME 05/29/20   ODebbrah Alar NP  Multiple Vitamins-Minerals (MULTIVITAMIN WITH MINERALS) tablet Take 1 tablet by mouth daily.    [provider]  nystatin (NYSTATIN) powder Apply topically 2 (two) times daily. 11/23/18   ODebbrah Alar NP  omeprazole (PRILOSEC) 20 MG capsule TAKE TWO CAPSULES BY MOUTH EVERY MORNING BEFORE BREAKFAST AND TAKE ONE CAPSULE BY MOUTH EVERY EVENING BEFORE SUPPER 08/07/20   ODebbrah Alar NP  ondansetron (ZOFRAN ODT) 4 MG disintegrating tablet Take 1 tablet (4 mg total) by mouth every 8 (eight) hours as needed for nausea or vomiting. 05/21/20   ODebbrah Alar NP  potassium chloride (KLOR-CON) 10 MEQ tablet TAKE ONE TABLET BY MOUTH EVERY OTHER DAY 05/29/20   ODebbrah Alar NP  Prenatal Vit-Fe Fumarate-FA (MULTIVITAMIN-PRENATAL) 27-0.8 MG TABS tablet Take 1 tablet by mouth daily.     [provider]  UBRELVY 100 MG TABS TAKE  ONE TABLET BY MOUTH AS NEEDED. MAY REPEAT IN TWO HOURS. MAXIMUM OF TWO TABLETS IN A 24 HOUR PERIOD. 08/07/20   Pieter Partridge, DO    Allergies    Dust mite extract, Mold extract [trichophyton], Nsaids, Codeine, Amlodipine, and Tape  Review of Systems   Review of Systems  Constitutional: Negative for chills, fatigue and fever.  HENT: Negative for  congestion.   Eyes: Negative for visual disturbance.  Respiratory: Positive for chest tightness. Negative for cough, shortness of breath and wheezing.   Cardiovascular: Positive for chest pain. Negative for palpitations and leg swelling.  Gastrointestinal: Negative for abdominal pain, diarrhea, nausea and vomiting.  Genitourinary: Negative for dysuria.  Musculoskeletal: Negative for back pain and myalgias.  Neurological: Negative for weakness and headaches.  All other systems reviewed and are negative.   Physical Exam Updated Vital Signs BP 114/60   Pulse 92   Temp 97.9 F (36.6 C) (Oral)   Resp 14   Ht 5' 7"  (1.702 m)   Wt 130.2 kg   LMP 08/25/2020   SpO2 100%   BMI 44.95 kg/m   Physical Exam Vitals and nursing note reviewed.  Constitutional:      General: She is not in acute distress.    Appearance: She is obese. She is not diaphoretic.  HENT:     Head: Normocephalic and atraumatic.  Eyes:     General: No scleral icterus.       Right eye: No discharge.        Left eye: No discharge.     Conjunctiva/sclera: Conjunctivae normal.  Cardiovascular:     Rate and Rhythm: Normal rate and regular rhythm.     Pulses:          Radial pulses are 2+ on the right side and 2+ on the left side.       Dorsalis pedis pulses are 2+ on the right side and 2+ on the left side.       Posterior tibial pulses are 2+ on the right side and 2+ on the left side.     Heart sounds: Murmur heard.    Pulmonary:     Effort: Pulmonary effort is normal. No respiratory distress.     Breath sounds: No stridor.  Chest:     Chest wall: No tenderness.  Abdominal:     General: There is no distension.     Palpations: Abdomen is soft.     Tenderness: There is no abdominal tenderness. There is no guarding.  Musculoskeletal:        General: No deformity.     Cervical back: Normal range of motion.     Right lower leg: No tenderness. No edema.     Left lower leg: No tenderness. No edema.  Skin:     General: Skin is warm and dry.  Neurological:     Mental Status: She is alert.     Motor: No abnormal muscle tone.  Psychiatric:        Behavior: Behavior normal.     ED Results / Procedures / Treatments   Labs (all labs ordered are listed, but only abnormal results are displayed) Labs Reviewed  BASIC METABOLIC PANEL - Abnormal; Notable for the following components:      Result Value   Potassium 3.4 (*)    Glucose, Bld 175 (*)    Calcium 8.8 (*)    All other components within normal limits  CBC - Abnormal; Notable for the following components:  WBC 10.8 (*)    All other components within normal limits  HEPATIC FUNCTION PANEL - Abnormal; Notable for the following components:   AST 42 (*)    ALT 56 (*)    All other components within normal limits  PREGNANCY, URINE  LIPASE, BLOOD  PROTIME-INR  D-DIMER, QUANTITATIVE (NOT AT [facility])  BRAIN NATRIURETIC PEPTIDE  TROPONIN I (HIGH SENSITIVITY)  TROPONIN I (HIGH SENSITIVITY)    EKG EKG Interpretation  Date/Time:  Wednesday September 02 2020 12:19:33 EST Ventricular Rate:  100 PR Interval:  172 QRS Duration: 84 QT Interval:  334 QTC Calculation: 430 R Axis:   20 Text Interpretation: Normal sinus rhythm Nonspecific T wave abnormality Abnormal ECG No significant change since last tracing Confirmed by Wandra Arthurs [phone]) on 09/02/2020 3:37:31 PM   Radiology DG Chest 2 View  Result Date: 09/02/2020 CLINICAL DATA:  Chest pain and shortness of breath EXAM: CHEST - 2 VIEW COMPARISON:  06/29/2020 FINDINGS: The heart size and mediastinal contours are within normal limits. Both lungs are clear. The visualized skeletal structures are unremarkable. IMPRESSION: No active cardiopulmonary disease. Electronically Signed   By: Inez Catalina M.D.   On: 09/02/2020 12:54    Procedures Procedures   Medications Ordered in ED Medications - No data to display  ED Course  I have reviewed the triage vital signs and the nursing notes.  Pertinent  labs & imaging results that were available during my care of the patient were reviewed by me and considered in my medical decision making (see chart for details).    MDM Rules/Calculators/A&P                         Patient is a 40 year old woman who presents today for evaluation of sudden onset of sharp substernal chest pain that radiates up both sides of her neck.  This started at 1150 this morning.  On exam she is generally well-appearing.  She is awake and alert in no obvious distress.  Lungs are clear to auscultation bilaterally.  She does have a slight murmur.  She is followed by cardiology as she has a bicuspid aortic valve. Here troponin x2 is negative.  She did have flulike symptoms after Covid exposure on the 22nd of last month however had negative Covid test.  Despite this D-dimer is obtained which is not elevated.  Doubt PE, ACS.  Chest x-ray is normal without evidence of mediastinal widening, consolidation, pneumothorax or other cause for her symptoms.  She is afebrile, not tachycardic or tachypneic and at 100% on room air.    This patient was seen as a shared visit with Dr. Darl Householder.  Urine pregnancy negative.  CBC is 10.8 which is slightly elevated which appears consistent with her prior labs.  BMP is unremarkable.  Hepatic function panel shows minimal transaminitis that does not appear to be clinically significant.  BNP, lipase and PT/INR are all normal.  Discussed options for treatment.  Patient elects for discharge home.  She does have cardiology and PCP and should be able to have close outpatient follow-up.  Return precautions were discussed with patient who states their understanding.  At the time of discharge patient denied any unaddressed complaints or concerns.  Patient is agreeable for discharge home.  Note: Portions of this report may have been transcribed using voice recognition software. Every effort was made to ensure accuracy; however, inadvertent computerized transcription  errors may be present  Final Clinical Impression(s) / ED Diagnoses Final  diagnoses:  Atypical chest pain    Rx / DC Orders ED Discharge Orders    None       Ollen Gross 09/02/20 1621    Drenda Freeze, MD 09/10/20 1259

## 2020-09-02 NOTE — Discharge Instructions (Signed)
Today in the emergency room your work-up was reassuring.  Please schedule a follow-up appointment with both your primary care doctor and your cardiologist.  If your pain or symptoms worsens, changes, or you have any new concerns please seek additional medical care and evaluation.

## 2020-09-23 ENCOUNTER — Ambulatory Visit (INDEPENDENT_AMBULATORY_CARE_PROVIDER_SITE_OTHER): Payer: Managed Care, Other (non HMO) | Admitting: Family

## 2020-09-23 ENCOUNTER — Other Ambulatory Visit: Payer: Self-pay

## 2020-09-23 ENCOUNTER — Telehealth: Payer: Self-pay | Admitting: Family

## 2020-09-23 VITALS — BP 131/76 | HR 72 | Temp 98.9°F | Resp 16 | Ht 66.0 in | Wt 291.0 lb

## 2020-09-23 DIAGNOSIS — K219 Gastro-esophageal reflux disease without esophagitis: Secondary | ICD-10-CM

## 2020-09-23 DIAGNOSIS — F419 Anxiety disorder, unspecified: Secondary | ICD-10-CM

## 2020-09-23 DIAGNOSIS — G8929 Other chronic pain: Secondary | ICD-10-CM

## 2020-09-23 DIAGNOSIS — E785 Hyperlipidemia, unspecified: Secondary | ICD-10-CM

## 2020-09-23 DIAGNOSIS — M545 Low back pain, unspecified: Secondary | ICD-10-CM

## 2020-09-23 DIAGNOSIS — E1165 Type 2 diabetes mellitus with hyperglycemia: Secondary | ICD-10-CM

## 2020-09-23 DIAGNOSIS — C220 Liver cell carcinoma: Secondary | ICD-10-CM

## 2020-09-23 DIAGNOSIS — E119 Type 2 diabetes mellitus without complications: Secondary | ICD-10-CM | POA: Diagnosis not present

## 2020-09-23 DIAGNOSIS — F32A Depression, unspecified: Secondary | ICD-10-CM

## 2020-09-23 DIAGNOSIS — K7581 Nonalcoholic steatohepatitis (NASH): Secondary | ICD-10-CM | POA: Diagnosis not present

## 2020-09-23 MED ORDER — ACCU-CHEK GUIDE VI STRP: ORAL_STRIP | 3 refills | Status: DC

## 2020-09-23 MED ORDER — ACCU-CHEK FASTCLIX LANCETS MISC: 3 refills | Status: DC

## 2020-09-23 NOTE — Progress Notes (Signed)
Subjective:    Patient ID: Stephanie Miles, female    DOB: [birth year], 40 y.o.   MRN: [id number]  HPI   Patient is a 40 yr old female who presents today for follow up. She will be moving to [place] in April.    DM2- AM sugars 180-185. She continues metformin 104m  daily.   Lab Results  Component Value Date   HGBA1C 6.0 06/25/2019   HGBA1C 6.1 03/15/2019   HGBA1C 7.4 (H) 08/15/2017   Lab Results  Component Value Date   CREATININE 0.81 09/02/2020   GERD- maintained on omeprazole.  Anxiety/depression-reports that she did a lot better this winter on wellbutrin in terms of her typical seasonal affective disorder.    She does report some chronic low back pain.    Review of Systems   See HPI  Past Medical History:  Diagnosis Date  . Allergy   . ANA positive 12/28/2016  . Anxiety   . Anxiety state 10/16/2013  . Aortic regurgitation due to bicuspid aortic valve   . Arthritis   . Asthma    allergy induced asthma   . Black stools 09/13/2017  . Borderline diabetes 10/20/2016  . Chronic kidney disease    kidney stones  . Cough variant asthma vs uacs  07/07/2015   Spirometry 07/06/2015   Purely restrictive so rec trial off qvar and rx dulera 100 2bid for flare off qvar > no flares on gerd rx as of 08/11/2015      . Depression   . Diabetes type 2, controlled ([place] 10/20/2016  . Diarrhea 06/20/2017  . Dyspnea 07/07/2015   Spirometry 07/06/2015 purely restrictive   . Elevated LFTs 01/11/2017  . Essential hypertension 06/06/2013   Formatting of this note might be different from the original. Last Assessment & Plan:  Stable off of meds, monitor.  . Eustachian tube dysfunction, bilateral 10/10/2018   Last Assessment & Plan:  Concern over her ears. Chronic history of gradual hearing loss.  No significant history of ear infections, surgery or trauma.  Her ears pop frequently and feel full.  There is some associated bilateral ringing.  She wants to understand is there anything  that can be done to improve this.  Recent audiogram is reviewed and shows a  mild to severe mixed hearing loss. EXAM show  . Family history of breast cancer in female   . Family history of colon cancer   . Fatty liver 11/28/2016  . Fibromyalgia 04/04/2019  . Genetic testing 02/28/2017   Negative genetic testing on the common hereditary cancer panel.  The Hereditary Gene Panel offered by [facility] includes sequencing and/or deletion duplication testing of the following 46 genes: APC, ATM, AXIN2, BARD1, BMPR1A, BRCA1, BRCA2, BRIP1, CDH1, CDKN2A (p14ARF), CDKN2A (p16INK4a), CHEK2, CTNNA1, DICER1, EPCAM (Deletion/duplication testing only), GREM1 (promoter region deletion/duplication te  . GERD (gastroesophageal reflux disease)   . Heart murmur    bicuspid AV per pt   . Hepatocellular carcinoma ([place] 01/31/2018  . History of chemotherapy    TACE for liver cancer   . History of kidney stones 10/21/2016  . Hyperlipidemia   . Hypertension   . Hypertriglyceridemia 04/05/2018  . IBS (irritable bowel syndrome)   . Insomnia   . Iron deficiency anemia due to chronic blood loss 03/21/2017  . Left leg injury, initial encounter 02/01/2018  . Leukocytosis 12/06/2016  . Liver cancer ([place] 03/2017  . Liver lesion 06/20/2017  . Migraine 07/08/2013  . Migraine headache   . Migraine, unspecified,  not intractable, without status migrainosus 07/08/2013   Formatting of this note might be different from the original. Last Assessment & Plan:  Stable, continue prn imitrex/zofran.  . 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 NASH (nonalcoholic steatohepatitis) 04/04/2017   Per biopsy 9/18  . Neuromuscular disorder ([place])    very mild neuropathy hands  feet   . OCD (obsessive compulsive disorder)   . Orbital cellulitis    at age 64 was hospitalized for 3 months (almost died)   . OSA (obstructive sleep apnea)   . Osteoarthritis 10/21/2016  . Personal history of tobacco use, presenting hazards to  health 01/20/2014  . Primary osteoarthritis of both knees 12/28/2016  . Right shoulder injury, subsequent encounter 02/01/2018  . RUQ abdominal pain 01/11/2017  . S/P appy 05/2013  . Severe obesity (BMI >= 40) ([place]) 07/07/2015   Formatting of this note might be different from the original. Last Assessment & Plan:  Formatting of this note might be different from the original. Body mass index is 40.21 kg/(m^2).  Lab Results  Component Value Date   TSH 0.562 07/09/2013   Contributing to gerd tendency/ doe/reviewed the need and the process to achieve and maintain neg calorie balance > defer f/u primary care including [facility]  . Sleep apnea    wears cpap   . Tinnitus of both ears 10/11/2018  . Type 2 diabetes mellitus without complication, without long-term current use of insulin ([place]) 10/20/2016   Formatting of this note might be different from the original. Last Assessment & Plan:  Stable, continue diabetic diet.  . Vitamin D deficiency 11/01/2017     Social History   Socioeconomic History  . Marital status: Single    Spouse name: Not on file  . Number of children: 0  . Years of education: Not on file  . Highest education level: Not on file  Occupational History  . Occupation: Ship broker  . Occupation: Programmer, applications  Tobacco Use  . Smoking status: Former Smoker    Packs/day: 1.00    Years: 20.00    Pack years: 20.00    Types: Cigarettes    Quit date: 02/22/2013    Years since quitting: 7.5  . Smokeless tobacco: Never Used  Vaping Use  . Vaping Use: Never used  Substance and Sexual Activity  . Alcohol use: Yes    Comment: occ  . Drug use: No  . Sexual activity: Not on file  Other Topics Concern  . Not on file  Social History Narrative   Working at [employer] doing [employer] alone (has a Neurosurgeon)   Working on a degree at [employer], will plan to return fall 2018   Enjoys spending time with family.   Friends and family in [place] Determinants of Health   Financial  Resource Strain: Not on file  Food Insecurity: Not on file  Transportation Needs: Not on file  Physical Activity: Not on file  Stress: Not on file  Social Connections: Not on file  Intimate Partner Violence: Not on file    Past Surgical History:  Procedure Laterality Date  . APPENDECTOMY    . COLONOSCOPY    . DENTAL SURGERY     wisdom teeth  . ESOPHAGOGASTRODUODENOSCOPY    . LAPAROSCOPIC APPENDECTOMY N/A 06/06/2013   Procedure: APPENDECTOMY LAPAROSCOPIC;  Surgeon: Imogene Burn. Georgette Dover, MD;  Location: [facility];  Service: General;  Laterality: N/A;  . LIVER BIOPSY    .  UPPER GASTROINTESTINAL ENDOSCOPY      Family History  Problem Relation Age of Onset  . Asthma Mother   . Hypertension Mother   . Breast cancer Father 71  . Diabetes Father   . Hypertension Father   . Heart disease Father   . Hyperlipidemia Father   . Asthma Sister   . Hyperlipidemia Brother   . Hypertension Brother   . Vision loss Brother   . Colon cancer Maternal Grandmother 74  . Liver cancer Maternal Grandmother   . Cervical cancer Maternal Aunt   . Stroke Paternal Uncle 75  . Melanoma Maternal Grandfather 30  . Colon polyps Maternal Grandfather   . AAA (abdominal aortic aneurysm) Paternal Grandmother   . Heart disease Paternal Grandfather   . Heart disease Paternal Uncle 67  . Colon cancer Other 58       MGM's mother  . Stomach cancer Other 55       MGM's mother  . Breast cancer Other        MGM's maternal aunt  . Cancer Other        MGM's maternal uncle with GI cancer  . Esophageal cancer Neg Hx   . Pancreatic cancer Neg Hx   . Rectal cancer Neg Hx     Allergies  Allergen Reactions  . Dust Mite Extract Shortness Of Breath and Cough  . Mold Extract [Trichophyton] Shortness Of Breath and Cough  . Nsaids Other (See Comments)    Patient is to NOT have NSAIDS  . Codeine Nausea Only    Required use of an antiemetic   . Amlodipine Swelling and Other (See Comments)    Edema   . Tape Rash     Medical tape and Band-Aids aren't tolerated    Current Outpatient Medications on File Prior to Visit  Medication Sig Dispense Refill  . acetaminophen (TYLENOL) 500 MG tablet Take 500-1,000 mg by mouth every 6 (six) hours as needed (for pain).     Marland Kitchen AIMOVIG 70 MG/ML SOAJ INJECT 70MG INTO THE SKIN ONCE EVERY 30 DAYS 1 mL 9  . albuterol (VENTOLIN HFA) 108 (90 Base) MCG/ACT inhaler Inhale 2 puffs into the lungs every 6 (six) hours as needed for wheezing or shortness of breath.    . ALPRAZolam (XANAX) 0.5 MG tablet TAKE ONE TABLET BY MOUTH TWICE A DAY AS NEEDED 45 tablet 0  . benzoyl peroxide-erythromycin (BENZAMYCIN) gel Apply topically 2 (two) times daily. 23.3 g 5  . Blood Glucose Monitoring Suppl (ACCU-CHEK GUIDE) w/Device KIT 1 each by Does not apply route daily. 1 kit 0  . budesonide-formoterol (SYMBICORT) 160-4.5 MCG/ACT inhaler INHALE TWO PUFFS BY MOUTH TWICE A DAY 10.2 g 5  . buPROPion (WELLBUTRIN XL) 150 MG 24 hr tablet Take 1 tablet (150 mg total) by mouth daily. 90 tablet 1  . cetirizine (ZYRTEC) 10 MG tablet Take 10 mg by mouth daily.    . Cholecalciferol (VITAMIN D3) 50 MCG (2000 UT) TABS Take 2,000 Units by mouth daily.    . clotrimazole-betamethasone (LOTRISONE) cream Apply 1 application topically 2 (two) times daily. 45 g 1  . dicyclomine (BENTYL) 20 MG tablet TAKE ONE TABLET BY MOUTH FOUR TIMES A DAY BEFORE MEALS AND AT BEDTIME AS DIRECTED 80 tablet 2  . DULoxetine (CYMBALTA) 60 MG capsule TAKE ONE CAPSULE BY MOUTH DAILY 90 capsule 4  . fluticasone (FLONASE) 50 MCG/ACT nasal spray Place 2 sprays into both nostrils daily. 16 g 1  . furosemide (LASIX) 40 MG tablet  TAKE ONE TABLET BY MOUTH EVERY MORNING 90 tablet 1  . Lactulose 20 GM/30ML SOLN 15 ML by mouth twice daily.  May titrate as needed to 37m three times daily with goal of 2-3 soft BM's/day 1892 mL 5  . losartan (COZAAR) 100 MG tablet Take 1 tablet (100 mg total) by mouth daily. 90 tablet 3  . metFORMIN (GLUCOPHAGE-XR) 500 MG  24 hr tablet TAKE TWO TABLETS BY MOUTH DAILY WITH BREAKFAST 180 tablet 1  . metoprolol succinate (TOPROL-XL) 100 MG 24 hr tablet TAKE ONE TABLET BY MOUTH DAILY OR IMMEDIATELY FOLLOWING A MEAL AS DIRECTED 90 tablet 1  . montelukast (SINGULAIR) 10 MG tablet TAKE ONE TABLET BY MOUTH EVERY NIGHT AT BEDTIME 30 tablet 5  . Multiple Vitamins-Minerals (MULTIVITAMIN WITH MINERALS) tablet Take 1 tablet by mouth daily.    .Marland Kitchennystatin (NYSTATIN) powder Apply topically 2 (two) times daily. 30 g 2  . omeprazole (PRILOSEC) 20 MG capsule TAKE TWO CAPSULES BY MOUTH EVERY MORNING BEFORE BREAKFAST AND TAKE ONE CAPSULE BY MOUTH EVERY EVENING BEFORE SUPPER 270 capsule 3  . ondansetron (ZOFRAN ODT) 4 MG disintegrating tablet Take 1 tablet (4 mg total) by mouth every 8 (eight) hours as needed for nausea or vomiting. 30 tablet 0  . potassium chloride (KLOR-CON) 10 MEQ tablet TAKE ONE TABLET BY MOUTH EVERY OTHER DAY 45 tablet 1  . Prenatal Vit-Fe Fumarate-FA (MULTIVITAMIN-PRENATAL) 27-0.8 MG TABS tablet Take 1 tablet by mouth daily.     .Marland KitchenUBRELVY 100 MG TABS TAKE ONE TABLET BY MOUTH AS NEEDED. MAY REPEAT IN TWO HOURS. MAXIMUM OF TWO TABLETS IN A 24 HOUR PERIOD. 10 tablet 11   No current facility-administered medications on file prior to visit.    BP 131/76 (BP Location: Right Arm, Patient Position: Sitting, Cuff Size: Large)   Pulse 72   Temp 98.9 F (37.2 C) (Oral)   Resp 16   Ht 5' 6"  (1.676 m)   Wt 291 lb (132 kg)   LMP 08/25/2020   SpO2 99%   BMI 46.97 kg/m        Objective:   Physical Exam Constitutional:      Appearance: She is well-developed and well-nourished.  Cardiovascular:     Rate and Rhythm: Normal rate and regular rhythm.     Heart sounds: Normal heart sounds. No murmur heard.   Pulmonary:     Effort: Pulmonary effort is normal. No respiratory distress.     Breath sounds: Normal breath sounds. No wheezing.  Psychiatric:        Mood and Affect: Mood and affect normal.        Behavior:  Behavior normal.        Thought Content: Thought content normal.        Judgment: Judgment normal.           Assessment & Plan:  Chronic low back pain- discussed weight loss and also doing some home exercises.  See AVS.  DM2- clinically stable. Obtain follow up A1C. She continues metformin xl 1000 mg once daily.   Depression/anxiety- stable/improved. Continue wellbutrin xl 1528monce daily.   GERD- stable on PPI, contineu omeprazole 4044mnce daily.   NASH/possible multifocal Hepatocellular Carcinoma- [facility] but to find a local hepatologist.    Chronic low back pain- discussed weight loss/back exercises.  This visit occurred during the SARS-CoV-2 public  health emergency.  Safety protocols were in place, including screening questions prior to the visit, additional usage of staff PPE, and extensive cleaning of exam room while observing appropriate contact time as indicated for disinfecting solutions.

## 2020-09-23 NOTE — Patient Instructions (Addendum)
Best of luck in [place]!     Back Exercises The following exercises strengthen the muscles that help to support the trunk and back. They also help to keep the lower back flexible. Doing these exercises can help to prevent back pain or lessen existing pain.  If you have back pain or discomfort, try doing these exercises 2-3 times each day or as told by your health care provider.  As your pain improves, do them once each day, but increase the number of times that you repeat the steps for each exercise (do more repetitions).  To prevent the recurrence of back pain, continue to do these exercises once each day or as told by your health care provider. Do exercises exactly as told by your health care provider and adjust them as directed. It is normal to feel mild stretching, pulling, tightness, or discomfort as you do these exercises, but you should stop right away if you feel sudden pain or your pain gets worse. Exercises Single knee to chest Repeat these steps 3-5 times for each leg: 1. Lie on your back on a firm bed or the floor with your legs extended. 2. Bring one knee to your chest. Your other leg should stay extended and in contact with the floor. 3. Hold your knee in place by grabbing your knee or thigh with both hands and hold. 4. Pull on your knee until you feel a gentle stretch in your lower back or buttocks. 5. Hold the stretch for 10-30 seconds. 6. Slowly release and straighten your leg. Pelvic tilt Repeat these steps 5-10 times: 1. Lie on your back on a firm bed or the floor with your legs extended. 2. Bend your knees so they are pointing toward the ceiling and your feet are flat on the floor. 3. Tighten your lower abdominal muscles to press your lower back against the floor. This motion will tilt your pelvis so your tailbone points up toward the ceiling instead of pointing to your feet or the floor. 4. With gentle tension and even breathing, hold this position for 5-10  seconds. Cat-cow Repeat these steps until your lower back becomes more flexible: 1. Get into a hands-and-knees position on a firm surface. Keep your hands under your shoulders, and keep your knees under your hips. You may place padding under your knees for comfort. 2. Let your head hang down toward your chest. Contract your abdominal muscles and point your tailbone toward the floor so your lower back becomes rounded like the back of a cat. 3. Hold this position for 5 seconds. 4. Slowly lift your head, let your abdominal muscles relax and point your tailbone up toward the ceiling so your back forms a sagging arch like the back of a cow. 5. Hold this position for 5 seconds.   Press-ups Repeat these steps 5-10 times: 1. Lie on your abdomen (face-down) on the floor. 2. Place your palms near your head, about shoulder-width apart. 3. Keeping your back as relaxed as possible and keeping your hips on the floor, slowly straighten your arms to raise the top half of your body and lift your shoulders. Do not use your back muscles to raise your upper torso. You may adjust the placement of your hands to make yourself more comfortable. 4. Hold this position for 5 seconds while you keep your back relaxed. 5. Slowly return to lying flat on the floor.   Bridges Repeat these steps 10 times: 1. Lie on your back on a firm surface. 2.  Bend your knees so they are pointing toward the ceiling and your feet are flat on the floor. Your arms should be flat at your sides, next to your body. 3. Tighten your buttocks muscles and lift your buttocks off the floor until your waist is at almost the same height as your knees. You should feel the muscles working in your buttocks and the back of your thighs. If you do not feel these muscles, slide your feet 1-2 inches farther away from your buttocks. 4. Hold this position for 3-5 seconds. 5. Slowly lower your hips to the starting position, and allow your buttocks muscles to relax  completely. If this exercise is too easy, try doing it with your arms crossed over your chest.   Abdominal crunches Repeat these steps 5-10 times: 1. Lie on your back on a firm bed or the floor with your legs extended. 2. Bend your knees so they are pointing toward the ceiling and your feet are flat on the floor. 3. Cross your arms over your chest. 4. Tip your chin slightly toward your chest without bending your neck. 5. Tighten your abdominal muscles and slowly raise your trunk (torso) high enough to lift your shoulder blades a tiny bit off the floor. Avoid raising your torso higher than that because it can put too much stress on your low back and does not help to strengthen your abdominal muscles. 6. Slowly return to your starting position. Back lifts Repeat these steps 5-10 times: 1. Lie on your abdomen (face-down) with your arms at your sides, and rest your forehead on the floor. 2. Tighten the muscles in your legs and your buttocks. 3. Slowly lift your chest off the floor while you keep your hips pressed to the floor. Keep the back of your head in line with the curve in your back. Your eyes should be looking at the floor. 4. Hold this position for 3-5 seconds. 5. Slowly return to your starting position. Contact a health care provider if:  Your back pain or discomfort gets much worse when you do an exercise.  Your worsening back pain or discomfort does not lessen within 2 hours after you exercise. If you have any of these problems, stop doing these exercises right away. Do not do them again unless your health care provider says that you can. Get help right away if:  You develop sudden, severe back pain. If this happens, stop doing the exercises right away. Do not do them again unless your health care provider says that you can. This information is not intended to replace advice given to you by your health care provider. Make sure you discuss any questions you have with your health care  provider. Document Revised: 11/15/2018 Document Reviewed: 04/12/2018 Elsevier Patient Education  [employer].

## 2020-09-24 LAB — LIPID PANEL
Cholesterol: 207 mg/dL — ABNORMAL HIGH (ref 0–200)
HDL: 39.1 mg/dL (ref >39.00)
NonHDL: 167.66
Total CHOL/HDL Ratio: 5
Triglycerides: 260 mg/dL — ABNORMAL HIGH (ref 0.0–149.0)
VLDL: 52 mg/dL — ABNORMAL HIGH (ref 0.0–40.0)

## 2020-09-24 LAB — LDL CHOLESTEROL, DIRECT: Direct LDL: 139 mg/dL

## 2020-09-24 LAB — HEMOGLOBIN A1C: Hgb A1c MFr Bld: 6.9 % — ABNORMAL HIGH (ref 4.6–6.5)

## 2020-09-26 NOTE — Telephone Encounter (Signed)
Opened in error

## 2020-10-15 ENCOUNTER — Other Ambulatory Visit: Payer: Self-pay | Admitting: Family

## 2020-10-15 NOTE — Telephone Encounter (Signed)
Requesting: alprazolam 0.53m Contract: 04/27/2018 UDS: 04/27/2018 Last Visit: 09/23/2020 Next Visit: None Last Refill: 08/07/2020 #45 and 0RF  Please Advise

## 2020-10-23 ENCOUNTER — Other Ambulatory Visit: Payer: Self-pay

## 2020-10-23 ENCOUNTER — Ambulatory Visit (INDEPENDENT_AMBULATORY_CARE_PROVIDER_SITE_OTHER): Payer: Managed Care, Other (non HMO) | Admitting: Family

## 2020-10-23 VITALS — BP 149/73 | HR 69 | Temp 98.4°F | Resp 16 | Ht 66.0 in | Wt 286.0 lb

## 2020-10-23 DIAGNOSIS — T148XXA Other injury of unspecified body region, initial encounter: Secondary | ICD-10-CM | POA: Diagnosis not present

## 2020-10-23 NOTE — Patient Instructions (Signed)
Sliver Removal, Care After A sliver--also called a splinter--is a small and thin broken piece of an object that gets stuck (embedded) under your skin. A sliver can create a deep wound that can easily become infected. It is important to care for the wound after a sliver is removed to help prevent infection and other problems from developing. Slivers often break into smaller pieces when they are removed. If pieces of your sliver broke off and stayed in your skin, in time you will see them working themselves out. You also may feel some pain at the wound site. This is normal. What can I expect after the procedure? After a sliver has been removed, it is common to have:  Some pain at the wound site. This is especially common if pieces of the sliver remained in your skin and will come out on their own. Follow these instructions at home: Wound care  Follow instructions from your health care provider about how to take care of your wound. Make sure you: ? Wash your hands with soap and water before you change your bandage (dressing). If soap and water are not available, use hand sanitizer. ? Change your dressing as told by your health care provider.  Check your wound every day for signs of infection. Check for: ? Redness, swelling, or pain. ? Fluid or blood. ? Pus or a bad smell. ? Warmth.  Do not take baths, swim, or use a hot tub until your health care provider says it is okay to do so.   General instructions  Take over-the-counter and prescription medicines only as told by your health care provider.  If you were prescribed an antibiotic medicine, take or apply it as told by your health care provider. Do not stop using the antibiotic even if you start to feel better.  If directed, put ice on the painful area. ? Put ice in a plastic bag. ? Place a towel between your skin and the bag. ? Leave the ice on for 20 minutes, 2-3 times a day.  Keep all follow-up visits as told by your health care  provider. This is important. Contact a health care provider if:  You think that a piece of the sliver is still in your skin.  You have signs of infection, including: ? New or worsening redness around the wound. ? New or worsening tenderness around the wound. ? Fluid, blood, or pus coming from the wound. ? A bad smell coming from the wound or dressing.  You received a tetanus shot and you have swelling, severe pain, redness, or bleeding at the injection site. Get help right away if you have:  Red streaks coming from the wound.  An unexplained fever. Summary  A sliver--also called a splinter--is a small and thin broken piece of an object that gets stuck (embedded) under your skin.  It is important to care for the wound after a sliver is removed to help prevent infection and other problems from developing.  Be sure to contact your health care provider if fluid, blood, or pus is coming from the wound. This information is not intended to replace advice given to you by your health care provider. Make sure you discuss any questions you have with your health care provider. Document Revised: 11/01/2018 Document Reviewed: 08/08/2017 Elsevier Patient Education  2021 [employer].

## 2020-10-23 NOTE — Progress Notes (Signed)
Subjective:    Patient ID: Stephanie Miles, female    DOB: 1981/01/19, 40 y.o.   MRN: [id number]  HPI   Patient is a 40 yr old female who presents today with chief complaint of splinter. Splinter has been present for 2 days. Splinter is located in the left heel.  She has been trying to get the splinter out with a sterilized exacto knife as well as tweezers unsuccessfully.  Reports that the area is sore.    Review of Systems See HPI  Past Medical History:  Diagnosis Date  . Allergy   . ANA positive 12/28/2016  . Anxiety   . Anxiety state 10/16/2013  . Aortic regurgitation due to bicuspid aortic valve   . Arthritis   . Asthma    allergy induced asthma   . Black stools 09/13/2017  . Borderline diabetes 10/20/2016  . Chronic kidney disease    kidney stones  . Cough variant asthma vs uacs  07/07/2015   Spirometry 07/06/2015   Purely restrictive so rec trial off qvar and rx dulera 100 2bid for flare off qvar > no flares on gerd rx as of 08/11/2015      . Depression   . Diabetes type 2, controlled ([place]) 10/20/2016  . Diarrhea 06/20/2017  . Dyspnea 07/07/2015   Spirometry 07/06/2015 purely restrictive   . Elevated LFTs 01/11/2017  . Essential hypertension 06/06/2013   Formatting of this note might be different from the original. Last Assessment & Plan:  Stable off of meds, monitor.  . Eustachian tube dysfunction, bilateral 10/10/2018   Last Assessment & Plan:  Concern over her ears. Chronic history of gradual hearing loss.  No significant history of ear infections, surgery or trauma.  Her ears pop frequently and feel full.  There is some associated bilateral ringing.  She wants to understand is there anything that can be done to improve this.  Recent audiogram is reviewed and shows a  mild to severe mixed hearing loss. EXAM show  . Family history of breast cancer in female   . Family history of colon cancer   . Fatty liver 11/28/2016  . Fibromyalgia 04/04/2019  . Genetic testing  02/28/2017   Negative genetic testing on the common hereditary cancer panel.  The Hereditary Gene Panel offered by [facility] includes sequencing and/or deletion duplication testing of the following 46 genes: APC, ATM, AXIN2, BARD1, BMPR1A, BRCA1, BRCA2, BRIP1, CDH1, CDKN2A (p14ARF), CDKN2A (p16INK4a), CHEK2, CTNNA1, DICER1, EPCAM (Deletion/duplication testing only), GREM1 (promoter region deletion/duplication te  . GERD (gastroesophageal reflux disease)   . Heart murmur    bicuspid AV per pt   . Hepatocellular carcinoma ([place]) 01/31/2018  . History of chemotherapy    TACE for liver cancer   . History of kidney stones 10/21/2016  . Hyperlipidemia   . Hypertension   . Hypertriglyceridemia 04/05/2018  . IBS (irritable bowel syndrome)   . Insomnia   . Iron deficiency anemia due to chronic blood loss 03/21/2017  . Left leg injury, initial encounter 02/01/2018  . Leukocytosis 12/06/2016  . Liver cancer ([place]) 03/2017  . Liver lesion 06/20/2017  . Migraine 07/08/2013  . Migraine headache   . Migraine, unspecified, not intractable, without status migrainosus 07/08/2013   Formatting of this note might be different from the original. Last Assessment & Plan:  Stable, continue prn imitrex/zofran.  . 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 NASH (nonalcoholic steatohepatitis) 04/04/2017   Per biopsy 9/18  . Neuromuscular disorder (  [place])    very mild neuropathy hands  feet   . OCD (obsessive compulsive disorder)   . Orbital cellulitis    at age 40 was hospitalized for 3 months (almost died)   . OSA (obstructive sleep apnea)   . Osteoarthritis 10/21/2016  . Personal history of tobacco use, presenting hazards to health 01/20/2014  . Primary osteoarthritis of both knees 12/28/2016  . Right shoulder injury, subsequent encounter 02/01/2018  . RUQ abdominal pain 01/11/2017  . S/P appy 05/2013  . Severe obesity (BMI >= 40) ([place]) 07/07/2015   Formatting of this note might be different  from the original. Last Assessment & Plan:  Formatting of this note might be different from the original. Body mass index is 40.21 kg/(m^2).  Lab Results  Component Value Date   TSH 0.562 07/09/2013   Contributing to gerd tendency/ doe/reviewed the need and the process to achieve and maintain neg calorie balance > defer f/u primary care including [facility]  . Sleep apnea    wears cpap   . Tinnitus of both ears 10/11/2018  . Type 2 diabetes mellitus without complication, without long-term current use of insulin ([place]) 10/20/2016   Formatting of this note might be different from the original. Last Assessment & Plan:  Stable, continue diabetic diet.  . Vitamin D deficiency 11/01/2017     Social History   Socioeconomic History  . Marital status: Single    Spouse name: Not on file  . Number of children: 0  . Years of education: Not on file  . Highest education level: Not on file  Occupational History  . Occupation: Ship broker  . Occupation: Programmer, applications  Tobacco Use  . Smoking status: Former Smoker    Packs/day: 1.00    Years: 20.00    Pack years: 20.00    Types: Cigarettes    Quit date: 02/22/2013    Years since quitting: 7.6  . Smokeless tobacco: Never Used  Vaping Use  . Vaping Use: Never used  Substance and Sexual Activity  . Alcohol use: Yes    Comment: occ  . Drug use: No  . Sexual activity: Not on file  Other Topics Concern  . Not on file  Social History Narrative   Working at [employer] doing [employer] alone (has a Neurosurgeon)   Working on a degree at [employer], will plan to return fall 2018   Enjoys spending time with family.   Friends and family in [place] Determinants of Health   Financial Resource Strain: Not on file  Food Insecurity: Not on file  Transportation Needs: Not on file  Physical Activity: Not on file  Stress: Not on file  Social Connections: Not on file  Intimate Partner Violence: Not on file    Past Surgical History:  Procedure  Laterality Date  . APPENDECTOMY    . COLONOSCOPY    . DENTAL SURGERY     wisdom teeth  . ESOPHAGOGASTRODUODENOSCOPY    . LAPAROSCOPIC APPENDECTOMY N/A 06/06/2013   Procedure: APPENDECTOMY LAPAROSCOPIC;  Surgeon: Imogene Burn. Georgette Dover, MD;  Location: [facility];  Service: General;  Laterality: N/A;  . LIVER BIOPSY    . UPPER GASTROINTESTINAL ENDOSCOPY      Family History  Problem Relation Age of Onset  . Asthma Mother   . Hypertension Mother   . Breast cancer Father 42  . Diabetes Father   . Hypertension Father   . Heart disease Father   . Hyperlipidemia Father   .  Asthma Sister   . Hyperlipidemia Brother   . Hypertension Brother   . Vision loss Brother   . Colon cancer Maternal Grandmother 6  . Liver cancer Maternal Grandmother   . Cervical cancer Maternal Aunt   . Stroke Paternal Uncle 77  . Melanoma Maternal Grandfather 91  . Colon polyps Maternal Grandfather   . AAA (abdominal aortic aneurysm) Paternal Grandmother   . Heart disease Paternal Grandfather   . Heart disease Paternal Uncle 9  . Colon cancer Other 53       MGM's mother  . Stomach cancer Other 82       MGM's mother  . Breast cancer Other        MGM's maternal aunt  . Cancer Other        MGM's maternal uncle with GI cancer  . Esophageal cancer Neg Hx   . Pancreatic cancer Neg Hx   . Rectal cancer Neg Hx     Allergies  Allergen Reactions  . Dust Mite Extract Shortness Of Breath and Cough  . Mold Extract [Trichophyton] Shortness Of Breath and Cough  . Nsaids Other (See Comments)    Patient is to NOT have NSAIDS  . Codeine Nausea Only    Required use of an antiemetic   . Amlodipine Swelling and Other (See Comments)    Edema   . Tape Rash    Medical tape and Band-Aids aren't tolerated    Current Outpatient Medications on File Prior to Visit  Medication Sig Dispense Refill  . Accu-Chek FastClix Lancets MISC Check sugar twice daily 102 each 3  . acetaminophen (TYLENOL) 500 MG tablet Take 500-1,000 mg by  mouth every 6 (six) hours as needed (for pain).     Marland Kitchen AIMOVIG 70 MG/ML SOAJ INJECT 70MG INTO THE SKIN ONCE EVERY 30 DAYS 1 mL 9  . albuterol (VENTOLIN HFA) 108 (90 Base) MCG/ACT inhaler Inhale 2 puffs into the lungs every 6 (six) hours as needed for wheezing or shortness of breath.    . ALPRAZolam (XANAX) 0.5 MG tablet TAKE ONE TABLET BY MOUTH TWICE A DAY AS NEEDED 45 tablet 0  . benzoyl peroxide-erythromycin (BENZAMYCIN) gel Apply topically 2 (two) times daily. 23.3 g 5  . Blood Glucose Monitoring Suppl (ACCU-CHEK GUIDE) w/Device KIT 1 each by Does not apply route daily. 1 kit 0  . budesonide-formoterol (SYMBICORT) 160-4.5 MCG/ACT inhaler INHALE TWO PUFFS BY MOUTH TWICE A DAY 10.2 g 5  . buPROPion (WELLBUTRIN XL) 150 MG 24 hr tablet Take 1 tablet (150 mg total) by mouth daily. 90 tablet 1  . cetirizine (ZYRTEC) 10 MG tablet Take 10 mg by mouth daily.    . Cholecalciferol (VITAMIN D3) 50 MCG (2000 UT) TABS Take 2,000 Units by mouth daily.    . clotrimazole-betamethasone (LOTRISONE) cream Apply 1 application topically 2 (two) times daily. 45 g 1  . dicyclomine (BENTYL) 20 MG tablet TAKE ONE TABLET BY MOUTH FOUR TIMES A DAY BEFORE MEALS AND AT BEDTIME AS DIRECTED 80 tablet 2  . DULoxetine (CYMBALTA) 60 MG capsule TAKE ONE CAPSULE BY MOUTH DAILY 90 capsule 4  . fluticasone (FLONASE) 50 MCG/ACT nasal spray Place 2 sprays into both nostrils daily. 16 g 1  . furosemide (LASIX) 40 MG tablet TAKE ONE TABLET BY MOUTH EVERY MORNING 90 tablet 1  . glucose blood (ACCU-CHEK GUIDE) test strip USE TO CHECK BLOOD SUGAR TWO TIMES A DAY 100 each 3  . Lactulose 20 GM/30ML SOLN 15 ML by mouth twice daily.  May titrate as needed to 3m three times daily with goal of 2-3 soft BM's/day 1892 mL 5  . losartan (COZAAR) 100 MG tablet Take 1 tablet (100 mg total) by mouth daily. 90 tablet 3  . metFORMIN (GLUCOPHAGE-XR) 500 MG 24 hr tablet TAKE TWO TABLETS BY MOUTH DAILY WITH BREAKFAST 180 tablet 1  . metoprolol succinate  (TOPROL-XL) 100 MG 24 hr tablet TAKE ONE TABLET BY MOUTH DAILY OR IMMEDIATELY FOLLOWING A MEAL AS DIRECTED 90 tablet 1  . montelukast (SINGULAIR) 10 MG tablet TAKE ONE TABLET BY MOUTH EVERY NIGHT AT BEDTIME 30 tablet 5  . Multiple Vitamins-Minerals (MULTIVITAMIN WITH MINERALS) tablet Take 1 tablet by mouth daily.    .Marland Kitchennystatin (NYSTATIN) powder Apply topically 2 (two) times daily. 30 g 2  . omeprazole (PRILOSEC) 20 MG capsule TAKE TWO CAPSULES BY MOUTH EVERY MORNING BEFORE BREAKFAST AND TAKE ONE CAPSULE BY MOUTH EVERY EVENING BEFORE SUPPER 270 capsule 3  . ondansetron (ZOFRAN ODT) 4 MG disintegrating tablet Take 1 tablet (4 mg total) by mouth every 8 (eight) hours as needed for nausea or vomiting. 30 tablet 0  . potassium chloride (KLOR-CON) 10 MEQ tablet TAKE ONE TABLET BY MOUTH EVERY OTHER DAY 45 tablet 1  . Prenatal Vit-Fe Fumarate-FA (MULTIVITAMIN-PRENATAL) 27-0.8 MG TABS tablet Take 1 tablet by mouth daily.     .Marland KitchenUBRELVY 100 MG TABS TAKE ONE TABLET BY MOUTH AS NEEDED. MAY REPEAT IN TWO HOURS. MAXIMUM OF TWO TABLETS IN A 24 HOUR PERIOD. 10 tablet 11   No current facility-administered medications on file prior to visit.    BP (!) 149/73 (BP Location: Right Arm, Patient Position: Sitting, Cuff Size: Large)   Pulse 69   Temp 98.4 F (36.9 C) (Oral)   Resp 16   Ht _0  (1.676 m)   Wt 286 lb (129.7 kg)   SpO2 98%   BMI 46.16 kg/m       Objective:   Physical Exam Constitutional:      Appearance: Normal appearance.  Skin:    General: Skin is warm and dry.     Comments: Small splinter noted in calloused skin of left heel  Neurological:     Mental Status: She is alert.           Assessment & Plan:  Splinter-  Area was cleansed with alcohol and splinter was removed using sterile 25 gauge needle tip and tweezers. A small amount of purulence was expressed.  Area was cleansed and triple antibiotic ointment was applied with bandage. Pt is reminded to wear shoes at all times and to  keep an eye on the area. She will let [place] if she develops drainage, pain redness.    This visit occurred during the SARS-CoV-2 public health emergency.  Safety protocols were in place, including screening questions prior to the visit, additional usage of staff PPE, and extensive cleaning of exam room while observing appropriate contact time as indicated for disinfecting solutions.

## 2020-11-12 ENCOUNTER — Encounter: Payer: Self-pay | Admitting: Family

## 2020-11-12 ENCOUNTER — Other Ambulatory Visit: Payer: Self-pay | Admitting: Family

## 2020-11-13 ENCOUNTER — Other Ambulatory Visit: Payer: Self-pay | Admitting: Family

## 2020-11-13 ENCOUNTER — Other Ambulatory Visit: Payer: Self-pay

## 2020-11-13 MED ORDER — CLOTRIMAZOLE-BETAMETHASONE 1-0.05 % EX CREA: 1.0000 "application " | TOPICAL_CREAM | Freq: Two times a day (BID) | CUTANEOUS | 1 refills | Status: AC

## 2020-11-30 ENCOUNTER — Encounter (HOSPITAL_BASED_OUTPATIENT_CLINIC_OR_DEPARTMENT_OTHER): Payer: Self-pay

## 2020-11-30 ENCOUNTER — Emergency Department (HOSPITAL_BASED_OUTPATIENT_CLINIC_OR_DEPARTMENT_OTHER): Payer: Managed Care, Other (non HMO)

## 2020-11-30 ENCOUNTER — Observation Stay (HOSPITAL_BASED_OUTPATIENT_CLINIC_OR_DEPARTMENT_OTHER)
Admission: EM | Admit: 2020-11-30 | Discharge: 2020-12-02 | Disposition: A | Discharge status: Home / Self Care | Payer: Managed Care, Other (non HMO) | Attending: Internal Medicine | Admitting: Internal Medicine

## 2020-11-30 ENCOUNTER — Other Ambulatory Visit: Payer: Self-pay

## 2020-11-30 DIAGNOSIS — Z20822 Contact with and (suspected) exposure to covid-19: Secondary | ICD-10-CM | POA: Insufficient documentation

## 2020-11-30 DIAGNOSIS — Z87891 Personal history of nicotine dependence: Secondary | ICD-10-CM | POA: Diagnosis not present

## 2020-11-30 DIAGNOSIS — Z8505 Personal history of malignant neoplasm of liver: Secondary | ICD-10-CM | POA: Diagnosis not present

## 2020-11-30 DIAGNOSIS — R079 Chest pain, unspecified: Secondary | ICD-10-CM | POA: Diagnosis present

## 2020-11-30 DIAGNOSIS — Z7984 Long term (current) use of oral hypoglycemic drugs: Secondary | ICD-10-CM | POA: Insufficient documentation

## 2020-11-30 DIAGNOSIS — Z79899 Other long term (current) drug therapy: Secondary | ICD-10-CM | POA: Insufficient documentation

## 2020-11-30 DIAGNOSIS — I129 Hypertensive chronic kidney disease with stage 1 through stage 4 chronic kidney disease, or unspecified chronic kidney disease: Secondary | ICD-10-CM | POA: Diagnosis not present

## 2020-11-30 DIAGNOSIS — N189 Chronic kidney disease, unspecified: Secondary | ICD-10-CM | POA: Insufficient documentation

## 2020-11-30 DIAGNOSIS — J45909 Unspecified asthma, uncomplicated: Secondary | ICD-10-CM | POA: Insufficient documentation

## 2020-11-30 DIAGNOSIS — E1122 Type 2 diabetes mellitus with diabetic chronic kidney disease: Secondary | ICD-10-CM | POA: Insufficient documentation

## 2020-11-30 DIAGNOSIS — Q231 Congenital insufficiency of aortic valve: Secondary | ICD-10-CM

## 2020-11-30 LAB — CBC WITH DIFFERENTIAL/PLATELET
Abs Immature Granulocytes: 0.04 10*3/uL (ref 0.00–0.07)
Basophils Absolute: 0.1 10*3/uL (ref 0.0–0.1)
Basophils Relative: 1 %
Eosinophils Absolute: 0.2 10*3/uL (ref 0.0–0.5)
Eosinophils Relative: 2 %
HCT: 41.3 % (ref 36.0–46.0)
Hemoglobin: 14.2 g/dL (ref 12.0–15.0)
Immature Granulocytes: 0 %
Lymphocytes Relative: 28 %
Lymphs Abs: 3.8 10*3/uL (ref 0.7–4.0)
MCH: 29.9 pg (ref 26.0–34.0)
MCHC: 34.4 g/dL (ref 30.0–36.0)
MCV: 86.9 fL (ref 80.0–100.0)
Monocytes Absolute: 0.9 10*3/uL (ref 0.1–1.0)
Monocytes Relative: 7 %
Neutro Abs: 8.5 10*3/uL — ABNORMAL HIGH (ref 1.7–7.7)
Neutrophils Relative %: 62 %
Platelets: 238 10*3/uL (ref 150–400)
RBC: 4.75 MIL/uL (ref 3.87–5.11)
RDW: 13.1 % (ref 11.5–15.5)
WBC: 13.6 10*3/uL — ABNORMAL HIGH (ref 4.0–10.5)
nRBC: 0 % (ref 0.0–0.2)

## 2020-11-30 LAB — BASIC METABOLIC PANEL
Anion gap: 8 (ref 5–15)
BUN: 7 mg/dL (ref 6–20)
CO2: 26 mmol/L (ref 22–32)
Calcium: 8.8 mg/dL — ABNORMAL LOW (ref 8.9–10.3)
Chloride: 103 mmol/L (ref 98–111)
Creatinine, Ser: 0.77 mg/dL (ref 0.44–1.00)
GFR, Estimated: >60 mL/min (ref >60)
Glucose, Bld: 145 mg/dL — ABNORMAL HIGH (ref 70–99)
Potassium: 3.5 mmol/L (ref 3.5–5.1)
Sodium: 137 mmol/L (ref 135–145)

## 2020-11-30 LAB — RESP PANEL BY RT-PCR (FLU A&B, COVID) ARPGX2
Influenza A by PCR: NEGATIVE
Influenza B by PCR: NEGATIVE
SARS Coronavirus 2 by RT PCR: NEGATIVE

## 2020-11-30 LAB — TROPONIN I (HIGH SENSITIVITY)
Troponin I (High Sensitivity): 2 ng/L (ref <18)
Troponin I (High Sensitivity): 2 ng/L (ref <18)

## 2020-11-30 MED ORDER — NITROGLYCERIN 0.4 MG/SPRAY TL SOLN: 1.0000 | Status: DC | PRN

## 2020-11-30 MED ORDER — NITROGLYCERIN 0.4 MG SL SUBL: 0.4000 mg | SUBLINGUAL_TABLET | SUBLINGUAL | Status: DC | PRN

## 2020-11-30 MED ORDER — ASPIRIN 81 MG PO CHEW
324.0000 mg | CHEWABLE_TABLET | Freq: Once | ORAL | Status: AC
  Administered 2020-11-30: 324 mg via ORAL
  Filled 2020-11-30: qty 4

## 2020-11-30 NOTE — ED Notes (Signed)
States was at work this am and was sitting down, had sudden onset of jaw and mid sternal chest pain, sharp in nature, pain also radiated to both shoulders with worsening pain to rt shoulder. States has hx of DM II, HTN, Obesity and smoker. States does see a cardiologist to AV issues and has scheduled appt on June 1st. EDP at bedside, orders have been rec

## 2020-11-30 NOTE — ED Triage Notes (Signed)
Pt c/o CP since 430pm-NAD-to triage in w/c

## 2020-11-30 NOTE — ED Notes (Addendum)
Pt given a lean cuisine to eat.

## 2020-11-30 NOTE — ED Provider Notes (Signed)
[facility] EMERGENCY DEPARTMENT Provider Note   CSN: [id number] Arrival date & time: 11/30/20  1746     History Chief Complaint  Patient presents with  . Chest Pain    Stephanie Miles is a 40 y.o. female.  Patient states that she was at work this am and was sitting down, had sudden onset of jaw and mid sternal chest pain, sharp in nature, pain also radiated to both shoulders with worsening pain to rt shoulder. States has hx of DM II, HTN, Obesity and smoker.         Past Medical History:  Diagnosis Date  . Allergy   . ANA positive 12/28/2016  . Anxiety   . Anxiety state 10/16/2013  . Aortic regurgitation due to bicuspid aortic valve   . Arthritis   . Asthma    allergy induced asthma   . Black stools 09/13/2017  . Borderline diabetes 10/20/2016  . Chronic kidney disease    kidney stones  . Cough variant asthma vs uacs  07/07/2015   Spirometry 07/06/2015   Purely restrictive so rec trial off qvar and rx dulera 100 2bid for flare off qvar > no flares on gerd rx as of 08/11/2015      . Depression   . Diabetes type 2, controlled ([place]) 10/20/2016  . Diarrhea 06/20/2017  . Dyspnea 07/07/2015   Spirometry 07/06/2015 purely restrictive   . Elevated LFTs 01/11/2017  . Essential hypertension 06/06/2013   Formatting of this note might be different from the original. Last Assessment & Plan:  Stable off of meds, monitor.  . Eustachian tube dysfunction, bilateral 10/10/2018   Last Assessment & Plan:  Concern over her ears. Chronic history of gradual hearing loss.  No significant history of ear infections, surgery or trauma.  Her ears pop frequently and feel full.  There is some associated bilateral ringing.  She wants to understand is there anything that can be done to improve this.  Recent audiogram is reviewed and shows a  mild to severe mixed hearing loss. EXAM show  . Family history of breast cancer in female   . Family history of colon cancer   . Fatty liver 11/28/2016   . Fibromyalgia 04/04/2019  . Genetic testing 02/28/2017   Negative genetic testing on the common hereditary cancer panel.  The Hereditary Gene Panel offered by [facility] includes sequencing and/or deletion duplication testing of the following 46 genes: APC, ATM, AXIN2, BARD1, BMPR1A, BRCA1, BRCA2, BRIP1, CDH1, CDKN2A (p14ARF), CDKN2A (p16INK4a), CHEK2, CTNNA1, DICER1, EPCAM (Deletion/duplication testing only), GREM1 (promoter region deletion/duplication te  . GERD (gastroesophageal reflux disease)   . Heart murmur    bicuspid AV per pt   . Hepatocellular carcinoma ([place]) 01/31/2018  . History of chemotherapy    TACE for liver cancer   . History of kidney stones 10/21/2016  . Hyperlipidemia   . Hypertension   . Hypertriglyceridemia 04/05/2018  . IBS (irritable bowel syndrome)   . Insomnia   . Iron deficiency anemia due to chronic blood loss 03/21/2017  . Left leg injury, initial encounter 02/01/2018  . Leukocytosis 12/06/2016  . Liver cancer ([place]) 03/2017  . Liver lesion 06/20/2017  . Migraine 07/08/2013  . Migraine headache   . Migraine, unspecified, not intractable, without status migrainosus 07/08/2013   Formatting of this note might be different from the original. Last Assessment & Plan:  Stable, continue prn imitrex/zofran.  . 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 NASH (nonalcoholic steatohepatitis)  04/04/2017   Per biopsy 9/18  . Neuromuscular disorder ([place])    very mild neuropathy hands  feet   . OCD (obsessive compulsive disorder)   . Orbital cellulitis    at age 66 was hospitalized for 3 months (almost died)   . OSA (obstructive sleep apnea)   . Osteoarthritis 10/21/2016  . Personal history of tobacco use, presenting hazards to health 01/20/2014  . Primary osteoarthritis of both knees 12/28/2016  . Right shoulder injury, subsequent encounter 02/01/2018  . RUQ abdominal pain 01/11/2017  . S/P appy 05/2013  . Severe obesity (BMI >= 40) ([place]) 07/07/2015    Formatting of this note might be different from the original. Last Assessment & Plan:  Formatting of this note might be different from the original. Body mass index is 40.21 kg/(m^2).  Lab Results  Component Value Date   TSH 0.562 07/09/2013   Contributing to gerd tendency/ doe/reviewed the need and the process to achieve and maintain neg calorie balance > defer f/u primary care including [facility]  . Sleep apnea    wears cpap   . Tinnitus of both ears 10/11/2018  . Type 2 diabetes mellitus without complication, without long-term current use of insulin ([place]) 10/20/2016   Formatting of this note might be different from the original. Last Assessment & Plan:  Stable, continue diabetic diet.  . Vitamin D deficiency 11/01/2017    Patient Active Problem List   Diagnosis Date Noted  . Chest pain at rest 11/30/2020  . Allergy   . Anxiety   . Arthritis   . Chronic kidney disease   . Depression   . Heart murmur   . History of chemotherapy   . Hyperlipidemia   . Hypertension   . Insomnia   . Migraine headache   . Neuromuscular disorder ([place])   . OCD (obsessive compulsive disorder)   . Orbital cellulitis   . OSA (obstructive sleep apnea)   . Fibromyalgia 04/04/2019  . 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 Tinnitus of both ears 10/11/2018  . Eustachian tube dysfunction, bilateral 10/10/2018  . Hypertriglyceridemia 04/05/2018  . Right shoulder injury, subsequent encounter 02/01/2018  . Left leg injury, initial encounter 02/01/2018  . Hepatocellular carcinoma ([place]) 01/31/2018  . Vitamin D deficiency 11/01/2017  . Black stools 09/13/2017  . Liver lesion 06/20/2017  . Diarrhea 06/20/2017  . NASH (nonalcoholic steatohepatitis) 04/04/2017  . Liver cancer ([place]) 03/2017  . Iron deficiency anemia due to chronic blood loss 03/21/2017  . Genetic testing 02/28/2017  . Family history of breast cancer in female   . Family history of colon cancer   . Elevated LFTs  01/11/2017  . RUQ abdominal pain 01/11/2017  . PTSD (post-traumatic stress disorder) 12/28/2016  . Primary osteoarthritis of both knees 12/28/2016  . ANA positive 12/28/2016  . Leukocytosis 12/06/2016  . Fatty liver 11/28/2016  . History of kidney stones 10/21/2016  . Osteoarthritis 10/21/2016  . Type 2 diabetes mellitus without complication, without long-term current use of insulin ([place]) 10/20/2016  . Borderline diabetes 10/20/2016  . Cough variant asthma vs uacs  07/07/2015  . Severe obesity (BMI >= 40) ([place]) 07/07/2015  . Dyspnea 07/07/2015  . Asthma 05/27/2015  . Aortic regurgitation due to bicuspid aortic valve 03/12/2014  . Personal history of tobacco use, presenting hazards to health 01/20/2014  . Anxiety state 10/16/2013  . Migraine 07/08/2013  . Sleep apnea 07/08/2013  . Migraine, unspecified, not intractable, without status migrainosus 07/08/2013  .  IBS (irritable bowel syndrome) 06/06/2013  . GERD (gastroesophageal reflux disease) 06/06/2013  . Essential hypertension 06/06/2013  . S/P appy 05/2013    Past Surgical History:  Procedure Laterality Date  . APPENDECTOMY    . COLONOSCOPY    . DENTAL SURGERY     wisdom teeth  . ESOPHAGOGASTRODUODENOSCOPY    . LAPAROSCOPIC APPENDECTOMY N/A 06/06/2013   Procedure: APPENDECTOMY LAPAROSCOPIC;  Surgeon: Imogene Burn. Georgette Dover, MD;  Location: [facility];  Service: General;  Laterality: N/A;  . LIVER BIOPSY    . UPPER GASTROINTESTINAL ENDOSCOPY       OB History   No obstetric history on file.     Family History  Problem Relation Age of Onset  . Asthma Mother   . Hypertension Mother   . Breast cancer Father 32  . Diabetes Father   . Hypertension Father   . Heart disease Father   . Hyperlipidemia Father   . Asthma Sister   . Hyperlipidemia Brother   . Hypertension Brother   . Vision loss Brother   . Colon cancer Maternal Grandmother 61  . Liver cancer Maternal Grandmother   . Cervical cancer Maternal Aunt   . Stroke  Paternal Uncle 24  . Melanoma Maternal Grandfather 55  . Colon polyps Maternal Grandfather   . AAA (abdominal aortic aneurysm) Paternal Grandmother   . Heart disease Paternal Grandfather   . Heart disease Paternal Uncle 49  . Colon cancer Other 61       MGM's mother  . Stomach cancer Other 58       MGM's mother  . Breast cancer Other        MGM's maternal aunt  . Cancer Other        MGM's maternal uncle with GI cancer  . Esophageal cancer Neg Hx   . Pancreatic cancer Neg Hx   . Rectal cancer Neg Hx     Social History   Tobacco Use  . Smoking status: Former Smoker    Packs/day: 1.00    Years: 20.00    Pack years: 20.00    Types: Cigarettes    Quit date: 02/22/2013    Years since quitting: 7.7  . Smokeless tobacco: Never Used  Vaping Use  . Vaping Use: Never used  Substance Use Topics  . Alcohol use: Yes    Comment: occ  . Drug use: No    Home Medications Prior to Admission medications   Medication Sig Start Date End Date Taking? Authorizing Provider  Accu-Chek FastClix Lancets MISC Check sugar twice daily 09/23/20   Debbrah Alar, NP  acetaminophen (TYLENOL) 500 MG tablet Take 500-1,000 mg by mouth every 6 (six) hours as needed (for pain).     [provider]  AIMOVIG 70 MG/ML SOAJ INJECT 70MG INTO THE SKIN ONCE EVERY 30 DAYS 08/07/20   Tomi Likens, Adam R, DO  albuterol (VENTOLIN HFA) 108 (90 Base) MCG/ACT inhaler Inhale 2 puffs into the lungs every 6 (six) hours as needed for wheezing or shortness of breath.    [provider]  ALPRAZolam Duanne Moron) 0.5 MG tablet TAKE ONE TABLET BY MOUTH TWICE A DAY AS NEEDED 11/13/20   Debbrah Alar, NP  benzoyl peroxide-erythromycin [facility]) gel Apply topically 2 (two) times daily. 01/06/20   Debbrah Alar, NP  Blood Glucose Monitoring Suppl (ACCU-CHEK GUIDE) w/Device KIT 1 each by Does not apply route daily. 01/09/17   Debbrah Alar, NP  budesonide-formoterol (SYMBICORT) 160-4.5 MCG/ACT inhaler INHALE  TWO PUFFS BY MOUTH TWICE A DAY  04/21/20   Debbrah Alar, NP  buPROPion (WELLBUTRIN XL) 150 MG 24 hr tablet TAKE ONE TABLET BY MOUTH DAILY 11/13/20   Debbrah Alar, NP  cetirizine (ZYRTEC) 10 MG tablet Take 10 mg by mouth daily.    [provider]  Cholecalciferol (VITAMIN D3) 50 MCG (2000 UT) TABS Take 2,000 Units by mouth daily.    [provider]  clotrimazole-betamethasone (LOTRISONE) cream Apply 1 application topically 2 (two) times daily. 11/13/20   Debbrah Alar, NP  dicyclomine (BENTYL) 20 MG tablet TAKE ONE TABLET BY MOUTH FOUR TIMES A DAY BEFORE MEALS AND AT BEDTIME AS DIRECTED 08/07/20   Debbrah Alar, NP  DULoxetine (CYMBALTA) 60 MG capsule TAKE ONE CAPSULE BY MOUTH DAILY 11/13/20   Debbrah Alar, NP  fluticasone (FLONASE) 50 MCG/ACT nasal spray Place 2 sprays into both nostrils daily. 04/02/20   Saguier, Percell Miller, PA-C  furosemide (LASIX) 40 MG tablet TAKE ONE TABLET BY MOUTH EVERY MORNING 01/15/20   Debbrah Alar, NP  glucose blood (ACCU-CHEK GUIDE) test strip USE TO CHECK BLOOD SUGAR TWO TIMES A DAY 09/23/20   Debbrah Alar, NP  Lactulose 20 GM/30ML SOLN 15 ML by mouth twice daily.  May titrate as needed to 50m three times daily with goal of 2-3 soft BM's/day 05/09/19   ODebbrah Alar NP  losartan (COZAAR) 100 MG tablet TAKE ONE TABLET BY MOUTH DAILY 11/13/20   ODebbrah Alar NP  metFORMIN (GLUCOPHAGE-XR) 500 MG 24 hr tablet TAKE TWO TABLETS BY MOUTH DAILY WITH BREAKFAST 01/15/20   ODebbrah Alar NP  metoprolol succinate (TOPROL-XL) 100 MG 24 hr tablet TAKE ONE TABLET BY MOUTH DAILY OR IMMEDIATELY FOLLOWING A MEAL AS DIRECTED 01/15/20   ODebbrah Alar NP  montelukast (SINGULAIR) 10 MG tablet TAKE ONE TABLET BY MOUTH EVERY NIGHT AT BEDTIME 05/29/20   ODebbrah Alar NP  Multiple Vitamins-Minerals (MULTIVITAMIN WITH MINERALS) tablet Take 1 tablet by mouth daily.    [provider]  nystatin (NYSTATIN)  powder Apply topically 2 (two) times daily. 11/23/18   ODebbrah Alar NP  omeprazole (PRILOSEC) 20 MG capsule TAKE TWO CAPSULES BY MOUTH EVERY MORNING BEFORE BREAKFAST AND TAKE ONE CAPSULE BY MOUTH EVERY EVENING BEFORE SUPPER 08/07/20   ODebbrah Alar NP  ondansetron (ZOFRAN-ODT) 4 MG disintegrating tablet DISSOLVE ONE TABLET BY MOUTH EVERY 8 HOURS AS NEEDED FOR NAUSEA OR VOMITING 11/13/20   ODebbrah Alar NP  potassium chloride (KLOR-CON) 10 MEQ tablet TAKE ONE TABLET BY MOUTH EVERY OTHER DAY 05/29/20   ODebbrah Alar NP  Prenatal Vit-Fe Fumarate-FA (MULTIVITAMIN-PRENATAL) 27-0.8 MG TABS tablet Take 1 tablet by mouth daily.     [provider]  UBRELVY 100 MG TABS TAKE ONE TABLET BY MOUTH AS NEEDED. MAY REPEAT IN TWO HOURS. MAXIMUM OF TWO TABLETS IN A 24 HOUR PERIOD. 08/07/20   JPieter Partridge DO    Allergies    Dust mite extract, Mold extract [trichophyton], Nsaids, Codeine, Amlodipine, and Tape  Review of Systems   Review of Systems  Constitutional: Negative for fever.  HENT: Negative for ear pain.   Eyes: Negative for pain.  Respiratory: Negative for cough.   Cardiovascular: Positive for chest pain.  Gastrointestinal: Negative for abdominal pain.  Genitourinary: Negative for flank pain.  Musculoskeletal: Negative for back pain.  Skin: Negative for rash.  Neurological: Negative for headaches.    Physical Exam Updated Vital Signs BP 121/68   Pulse 64   Temp 98.4 F (36.9 C) (Oral)   Resp 15   Ht _0  (1.676 m)  Wt 127.5 kg   LMP 11/15/2020   SpO2 95%   BMI 45.35 kg/m   Physical Exam Constitutional:      General: She is not in acute distress.    Appearance: Normal appearance.  HENT:     Head: Normocephalic.     Nose: Nose normal.  Eyes:     Extraocular Movements: Extraocular movements intact.  Cardiovascular:     Rate and Rhythm: Normal rate.  Pulmonary:     Effort: Pulmonary effort is normal.  Musculoskeletal:        General: Normal  range of motion.     Cervical back: Normal range of motion.  Neurological:     General: No focal deficit present.     Mental Status: She is alert. Mental status is at baseline.     ED Results / Procedures / Treatments   Labs (all labs ordered are listed, but only abnormal results are displayed) Labs Reviewed  CBC WITH DIFFERENTIAL/PLATELET - Abnormal; Notable for the following components:      Result Value   WBC 13.6 (*)    Neutro Abs 8.5 (*)    All other components within normal limits  BASIC METABOLIC PANEL - Abnormal; Notable for the following components:   Glucose, Bld 145 (*)    Calcium 8.8 (*)    All other components within normal limits  RESP PANEL BY RT-PCR (FLU A&B, COVID) ARPGX2  TROPONIN I (HIGH SENSITIVITY)  TROPONIN I (HIGH SENSITIVITY)    EKG EKG Interpretation  Date/Time:  Monday Nov 30 2020 17:53:34 EDT Ventricular Rate:  78 PR Interval:  180 QRS Duration: 88 QT Interval:  374 QTC Calculation: 426 R Axis:   6 Text Interpretation: Normal sinus rhythm Possible Anterior infarct , age undetermined Abnormal ECG Confirmed by Thamas Jaegers ([id number]) on 11/30/2020 6:13:30 PM   Radiology DG Chest Port 1 View  Result Date: 11/30/2020 CLINICAL DATA:  Chest pain. EXAM: PORTABLE CHEST 1 VIEW COMPARISON:  September 02, 2020 FINDINGS: The heart size and mediastinal contours are within normal limits. Both lungs are clear. The visualized skeletal structures are unremarkable. IMPRESSION: No acute or active cardiopulmonary disease. Electronically Signed   By: Virgina Norfolk M.D.   On: 11/30/2020 19:43    Procedures Procedures   Medications Ordered in ED Medications  nitroGLYCERIN (NITROSTAT) SL tablet 0.4 mg (has no administration in time range)  aspirin chewable tablet 324 mg (324 mg Oral Given 11/30/20 2227)    ED Course  I have reviewed the triage vital signs and the nursing notes.  Pertinent labs & imaging results that were available during my care of the patient  were reviewed by me and considered in my medical decision making (see chart for details).    MDM Rules/Calculators/A&P                          Patient has extensive comorbidities including morbid obesity, hypertension, diabetes, high cholesterol.  Her chest pain symptoms occurred at rest and also radiates to her jaw as well as the right shoulder which are higher likelihood risk factors.  Initial sets of troponin are negative.  Patient ordered for aspirin as well as nitroglycerin.  Case discussed with hospitalist.  Will be brought in for further cardiac observation.  Final Clinical Impression(s) / ED Diagnoses Final diagnoses:  Chest pain, unspecified type    Rx / DC Orders ED Discharge Orders    None       [place], Vonna Kotyk  S, MD 11/30/20 2319

## 2020-12-01 ENCOUNTER — Observation Stay (HOSPITAL_COMMUNITY): Payer: Managed Care, Other (non HMO)

## 2020-12-01 ENCOUNTER — Encounter (HOSPITAL_COMMUNITY): Payer: Self-pay | Admitting: Family Medicine

## 2020-12-01 DIAGNOSIS — Z87891 Personal history of nicotine dependence: Secondary | ICD-10-CM | POA: Diagnosis not present

## 2020-12-01 DIAGNOSIS — Z8505 Personal history of malignant neoplasm of liver: Secondary | ICD-10-CM | POA: Diagnosis not present

## 2020-12-01 DIAGNOSIS — N189 Chronic kidney disease, unspecified: Secondary | ICD-10-CM | POA: Diagnosis not present

## 2020-12-01 DIAGNOSIS — I129 Hypertensive chronic kidney disease with stage 1 through stage 4 chronic kidney disease, or unspecified chronic kidney disease: Secondary | ICD-10-CM | POA: Diagnosis not present

## 2020-12-01 DIAGNOSIS — R002 Palpitations: Secondary | ICD-10-CM

## 2020-12-01 DIAGNOSIS — Z79899 Other long term (current) drug therapy: Secondary | ICD-10-CM | POA: Diagnosis not present

## 2020-12-01 DIAGNOSIS — E1122 Type 2 diabetes mellitus with diabetic chronic kidney disease: Secondary | ICD-10-CM | POA: Diagnosis not present

## 2020-12-01 DIAGNOSIS — Z7984 Long term (current) use of oral hypoglycemic drugs: Secondary | ICD-10-CM | POA: Diagnosis not present

## 2020-12-01 DIAGNOSIS — Z20822 Contact with and (suspected) exposure to covid-19: Secondary | ICD-10-CM | POA: Diagnosis not present

## 2020-12-01 DIAGNOSIS — R079 Chest pain, unspecified: Secondary | ICD-10-CM

## 2020-12-01 DIAGNOSIS — J45909 Unspecified asthma, uncomplicated: Secondary | ICD-10-CM | POA: Diagnosis not present

## 2020-12-01 DIAGNOSIS — Q231 Congenital insufficiency of aortic valve: Secondary | ICD-10-CM | POA: Diagnosis not present

## 2020-12-01 DIAGNOSIS — I10 Essential (primary) hypertension: Secondary | ICD-10-CM

## 2020-12-01 LAB — BASIC METABOLIC PANEL
Anion gap: 9 (ref 5–15)
BUN: 5 mg/dL — ABNORMAL LOW (ref 6–20)
CO2: 27 mmol/L (ref 22–32)
Calcium: 8.9 mg/dL (ref 8.9–10.3)
Chloride: 101 mmol/L (ref 98–111)
Creatinine, Ser: 0.88 mg/dL (ref 0.44–1.00)
GFR, Estimated: >60 mL/min (ref >60)
Glucose, Bld: 142 mg/dL — ABNORMAL HIGH (ref 70–99)
Potassium: 3.6 mmol/L (ref 3.5–5.1)
Sodium: 137 mmol/L (ref 135–145)

## 2020-12-01 LAB — CBC
HCT: 41.6 % (ref 36.0–46.0)
Hemoglobin: 13.9 g/dL (ref 12.0–15.0)
MCH: 29.6 pg (ref 26.0–34.0)
MCHC: 33.4 g/dL (ref 30.0–36.0)
MCV: 88.5 fL (ref 80.0–100.0)
Platelets: 223 10*3/uL (ref 150–400)
RBC: 4.7 MIL/uL (ref 3.87–5.11)
RDW: 13.2 % (ref 11.5–15.5)
WBC: 17.9 10*3/uL — ABNORMAL HIGH (ref 4.0–10.5)
nRBC: 0 % (ref 0.0–0.2)

## 2020-12-01 LAB — MAGNESIUM: Magnesium: 1.7 mg/dL (ref 1.7–2.4)

## 2020-12-01 LAB — HEMOGLOBIN A1C
Hgb A1c MFr Bld: 6.5 % — ABNORMAL HIGH (ref 4.8–5.6)
Mean Plasma Glucose: 139.85 mg/dL

## 2020-12-01 LAB — GLUCOSE, CAPILLARY
Glucose-Capillary: 105 mg/dL — ABNORMAL HIGH (ref 70–99)
Glucose-Capillary: 142 mg/dL — ABNORMAL HIGH (ref 70–99)
Glucose-Capillary: 164 mg/dL — ABNORMAL HIGH (ref 70–99)
Glucose-Capillary: 128 mg/dL — ABNORMAL HIGH (ref 70–99)
Glucose-Capillary: 111 mg/dL — ABNORMAL HIGH (ref 70–99)

## 2020-12-01 LAB — PHOSPHORUS: Phosphorus: 3.2 mg/dL (ref 2.5–4.6)

## 2020-12-01 MED ORDER — INSULIN ASPART 100 UNIT/ML IJ SOLN: 0.0000 [IU] | Freq: Every day | INTRAMUSCULAR | Status: DC

## 2020-12-01 MED ORDER — ACETAMINOPHEN 500 MG PO TABS
500.0000 mg | ORAL_TABLET | Freq: Four times a day (QID) | ORAL | Status: DC | PRN
  Administered 2020-12-01: 1000 mg via ORAL
  Filled 2020-12-01: qty 2

## 2020-12-01 MED ORDER — DULOXETINE HCL 60 MG PO CPEP
60.0000 mg | ORAL_CAPSULE | Freq: Every day | ORAL | Status: DC
  Administered 2020-12-01 (×2): 60 mg via ORAL
  Filled 2020-12-01 (×2): qty 1

## 2020-12-01 MED ORDER — PANTOPRAZOLE SODIUM 40 MG PO TBEC
40.0000 mg | DELAYED_RELEASE_TABLET | Freq: Every day | ORAL | Status: DC
  Filled 2020-12-01: qty 1

## 2020-12-01 MED ORDER — CLOTRIMAZOLE 1 % EX CREA
TOPICAL_CREAM | Freq: Two times a day (BID) | CUTANEOUS | Status: DC
  Filled 2020-12-01: qty 15

## 2020-12-01 MED ORDER — LORATADINE 10 MG PO TABS
10.0000 mg | ORAL_TABLET | Freq: Every day | ORAL | Status: DC
  Filled 2020-12-01: qty 1

## 2020-12-01 MED ORDER — LORATADINE 10 MG PO TABS
10.0000 mg | ORAL_TABLET | Freq: Every day | ORAL | Status: DC
  Administered 2020-12-01 – 2020-12-02 (×2): 10 mg via ORAL
  Filled 2020-12-01 (×2): qty 1

## 2020-12-01 MED ORDER — VITAMIN D 25 MCG (1000 UNIT) PO TABS
2000.0000 [IU] | ORAL_TABLET | Freq: Every day | ORAL | Status: DC
  Administered 2020-12-01 – 2020-12-02 (×2): 2000 [IU] via ORAL
  Filled 2020-12-01 (×3): qty 2

## 2020-12-01 MED ORDER — ALBUTEROL SULFATE HFA 108 (90 BASE) MCG/ACT IN AERS: 2.0000 | INHALATION_SPRAY | Freq: Four times a day (QID) | RESPIRATORY_TRACT | Status: DC | PRN

## 2020-12-01 MED ORDER — NITROGLYCERIN 0.4 MG SL SUBL
SUBLINGUAL_TABLET | SUBLINGUAL | Status: AC
  Filled 2020-12-01: qty 2

## 2020-12-01 MED ORDER — CHLORHEXIDINE GLUCONATE 0.12 % MT SOLN
15.0000 mL | Freq: Two times a day (BID) | OROMUCOSAL | Status: DC
  Administered 2020-12-01 (×2): 15 mL via OROMUCOSAL
  Filled 2020-12-01 (×2): qty 15

## 2020-12-01 MED ORDER — LOSARTAN POTASSIUM 50 MG PO TABS
100.0000 mg | ORAL_TABLET | Freq: Every day | ORAL | Status: DC
  Administered 2020-12-01 – 2020-12-02 (×2): 100 mg via ORAL
  Filled 2020-12-01 (×2): qty 2

## 2020-12-01 MED ORDER — ENOXAPARIN SODIUM 40 MG/0.4ML IJ SOSY
40.0000 mg | PREFILLED_SYRINGE | Freq: Every day | INTRAMUSCULAR | Status: DC
  Administered 2020-12-01: 40 mg via SUBCUTANEOUS
  Filled 2020-12-01: qty 0.4

## 2020-12-01 MED ORDER — ORAL CARE MOUTH RINSE
15.0000 mL | Freq: Two times a day (BID) | OROMUCOSAL | Status: DC
  Administered 2020-12-01 (×2): 15 mL via OROMUCOSAL

## 2020-12-01 MED ORDER — METOPROLOL TARTRATE 100 MG PO TABS
100.0000 mg | ORAL_TABLET | Freq: Once | ORAL | Status: AC
  Administered 2020-12-01: 100 mg via ORAL
  Filled 2020-12-01: qty 1

## 2020-12-01 MED ORDER — INSULIN ASPART 100 UNIT/ML IJ SOLN
0.0000 [IU] | Freq: Three times a day (TID) | INTRAMUSCULAR | Status: DC
  Administered 2020-12-01: 1 [IU] via SUBCUTANEOUS
  Administered 2020-12-01: 2 [IU] via SUBCUTANEOUS

## 2020-12-01 MED ORDER — METOPROLOL SUCCINATE ER 50 MG PO TB24
50.0000 mg | ORAL_TABLET | Freq: Every day | ORAL | Status: DC
  Administered 2020-12-02: 50 mg via ORAL
  Filled 2020-12-01: qty 1

## 2020-12-01 MED ORDER — POTASSIUM CHLORIDE CRYS ER 10 MEQ PO TBCR
10.0000 meq | EXTENDED_RELEASE_TABLET | ORAL | Status: DC
  Administered 2020-12-01: 10 meq via ORAL
  Filled 2020-12-01 (×2): qty 1

## 2020-12-01 MED ORDER — BUPROPION HCL ER (XL) 150 MG PO TB24
150.0000 mg | ORAL_TABLET | Freq: Every day | ORAL | Status: DC
  Administered 2020-12-01 – 2020-12-02 (×2): 150 mg via ORAL
  Filled 2020-12-01 (×2): qty 1

## 2020-12-01 MED ORDER — DICYCLOMINE HCL 20 MG PO TABS
20.0000 mg | ORAL_TABLET | Freq: Three times a day (TID) | ORAL | Status: DC
  Administered 2020-12-01 – 2020-12-02 (×6): 20 mg via ORAL
  Filled 2020-12-01 (×7): qty 1

## 2020-12-01 MED ORDER — LACTULOSE 10 GM/15ML PO SOLN
10.0000 g | Freq: Two times a day (BID) | ORAL | Status: DC
  Administered 2020-12-01 – 2020-12-02 (×3): 10 g via ORAL
  Filled 2020-12-01 (×3): qty 15

## 2020-12-01 MED ORDER — MONTELUKAST SODIUM 10 MG PO TABS
10.0000 mg | ORAL_TABLET | Freq: Every day | ORAL | Status: DC
  Administered 2020-12-01 (×2): 10 mg via ORAL
  Filled 2020-12-01 (×2): qty 1

## 2020-12-01 MED ORDER — BENZOYL PEROXIDE-ERYTHROMYCIN 5-3 % EX GEL: Freq: Two times a day (BID) | CUTANEOUS | Status: DC

## 2020-12-01 MED ORDER — PANTOPRAZOLE SODIUM 40 MG PO TBEC
40.0000 mg | DELAYED_RELEASE_TABLET | Freq: Two times a day (BID) | ORAL | Status: DC
  Administered 2020-12-01 – 2020-12-02 (×3): 40 mg via ORAL
  Filled 2020-12-01 (×3): qty 1

## 2020-12-01 MED ORDER — FUROSEMIDE 40 MG PO TABS
40.0000 mg | ORAL_TABLET | Freq: Every morning | ORAL | Status: DC
  Administered 2020-12-01 – 2020-12-02 (×2): 40 mg via ORAL
  Filled 2020-12-01 (×2): qty 1

## 2020-12-01 MED ORDER — IOHEXOL 350 MG/ML SOLN
100.0000 mL | Freq: Once | INTRAVENOUS | Status: AC | PRN
  Administered 2020-12-01: 100 mL via INTRAVENOUS

## 2020-12-01 MED ORDER — ALPRAZOLAM 0.5 MG PO TABS
0.5000 mg | ORAL_TABLET | Freq: Two times a day (BID) | ORAL | Status: DC | PRN
  Administered 2020-12-01: 0.5 mg via ORAL
  Filled 2020-12-01: qty 1

## 2020-12-01 MED ORDER — PRENATAL MULTIVITAMIN CH
1.0000 | ORAL_TABLET | Freq: Every day | ORAL | Status: DC
  Administered 2020-12-01 – 2020-12-02 (×2): 1 via ORAL
  Filled 2020-12-01 (×2): qty 1

## 2020-12-01 MED ORDER — MOMETASONE FURO-FORMOTEROL FUM 200-5 MCG/ACT IN AERO
2.0000 | INHALATION_SPRAY | Freq: Two times a day (BID) | RESPIRATORY_TRACT | Status: DC
  Administered 2020-12-01 – 2020-12-02 (×3): 2 via RESPIRATORY_TRACT
  Filled 2020-12-01: qty 8.8

## 2020-12-01 MED ORDER — UBROGEPANT 100 MG PO TABS: 100.0000 mg | ORAL_TABLET | Freq: Two times a day (BID) | ORAL | Status: DC | PRN

## 2020-12-01 NOTE — Progress Notes (Signed)
Pt seen and admitted by Dr Nevada Crane this am.  Please see her note for detailed H&P.  Stephanie Miles is a 40 y.o. female with medical history significant for type 2 diabetes, severe morbid obesity, essential hypertension, hyperlipidemia, chronic anxiety/depression, GERD, NASH, OSA on CPAP who presented initially to [facility] ED due to nonexertional substernal chest pain, sharp, involving her jaw bilaterally, 8 out of 10 in intensity associated with nausea . Cardiology consulted and she is scheduled for CTA today.    Hosie Poisson, MD

## 2020-12-01 NOTE — Consult Note (Signed)
Cardiology Consult    Patient ID: Stephanie Miles MRN: [id number], DOB/AGE: 42-23-1982   Admit date: 11/30/2020 Date of Consult: 12/01/2020  Primary Physician: Debbrah Alar, NP Primary Cardiologist: None Requesting Provider: Irene Pap, DO  Patient Profile    Stephanie Miles is a 40 y.o. female with a history of T2DM, HTN, HLD, GERD, NASH, morbid obesity, who presented for chest pain on whom we are asked to assess with risk stratification/evaluation.  History of Present Illness    40 year old female with history as above who presented to the [place] emergency department with nonexertional chest pain.  She reported 8/10 chest pain.  It was substernal and radiated to her jaw but did not have associated nausea, vomiting, cough, pleuritic nature, or relation to positional changes.  The pain lasted for two hours and went away on its own.  It was unresponsive to nitroglycerin which she received once and she was also treated with full dose aspirin.  She was brought to our institution for further evaluation and I was consulted.  She had another episode like this in February which was self limited and she presented to the ED for as well.  She will also occasionally experience palpitations during these attacks which are concerning to her.  ECG personally reviewed and shows normal sinus rhythm, normal ECG.  Initial troponin was 2.  Chest x-ray was unremarkable as were labs aside from a mild leukocytosis of 13.6k.  Of note, she does hav ea history of mild aortic regurgitation and an associated bicuspid aortic valve for which she follows with Dr. Geraldo Pitter.  Her last echocardiogram showed a preserved LVEF with mild LVH and G2DD, some mild RV diysfunction, and mild aortic regurgitation.  She had a CTA in light of the RV findings and this showed no embolus but did show a hiatal hernia.  Past Medical History   Past Medical History:  Diagnosis Date  . Allergy   . ANA positive  12/28/2016  . Anxiety   . Anxiety state 10/16/2013  . Aortic regurgitation due to bicuspid aortic valve   . Arthritis   . Asthma    allergy induced asthma   . Black stools 09/13/2017  . Borderline diabetes 10/20/2016  . Chronic kidney disease    kidney stones  . Cough variant asthma vs uacs  07/07/2015   Spirometry 07/06/2015   Purely restrictive so rec trial off qvar and rx dulera 100 2bid for flare off qvar > no flares on gerd rx as of 08/11/2015      . Depression   . Diabetes type 2, controlled ([place]) 10/20/2016  . Diarrhea 06/20/2017  . Dyspnea 07/07/2015   Spirometry 07/06/2015 purely restrictive   . Elevated LFTs 01/11/2017  . Essential hypertension 06/06/2013   Formatting of this note might be different from the original. Last Assessment & Plan:  Stable off of meds, monitor.  . Eustachian tube dysfunction, bilateral 10/10/2018   Last Assessment & Plan:  Concern over her ears. Chronic history of gradual hearing loss.  No significant history of ear infections, surgery or trauma.  Her ears pop frequently and feel full.  There is some associated bilateral ringing.  She wants to understand is there anything that can be done to improve this.  Recent audiogram is reviewed and shows a  mild to severe mixed hearing loss. EXAM show  . Family history of breast cancer in female   . Family history of colon cancer   . Fatty liver 11/28/2016  .  Fibromyalgia 04/04/2019  . Genetic testing 02/28/2017   Negative genetic testing on the common hereditary cancer panel.  The Hereditary Gene Panel offered by [facility] includes sequencing and/or deletion duplication testing of the following 46 genes: APC, ATM, AXIN2, BARD1, BMPR1A, BRCA1, BRCA2, BRIP1, CDH1, CDKN2A (p14ARF), CDKN2A (p16INK4a), CHEK2, CTNNA1, DICER1, EPCAM (Deletion/duplication testing only), GREM1 (promoter region deletion/duplication te  . GERD (gastroesophageal reflux disease)   . Heart murmur    bicuspid AV per pt   . Hepatocellular carcinoma ([place])  01/31/2018  . History of chemotherapy    TACE for liver cancer   . History of kidney stones 10/21/2016  . Hyperlipidemia   . Hypertension   . Hypertriglyceridemia 04/05/2018  . IBS (irritable bowel syndrome)   . Insomnia   . Iron deficiency anemia due to chronic blood loss 03/21/2017  . Left leg injury, initial encounter 02/01/2018  . Leukocytosis 12/06/2016  . Liver cancer ([place]) 03/2017  . Liver lesion 06/20/2017  . Migraine 07/08/2013  . Migraine headache   . Migraine, unspecified, not intractable, without status migrainosus 07/08/2013   Formatting of this note might be different from the original. Last Assessment & Plan:  Stable, continue prn imitrex/zofran.  . 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 NASH (nonalcoholic steatohepatitis) 04/04/2017   Per biopsy 9/18  . Neuromuscular disorder ([place])    very mild neuropathy hands  feet   . OCD (obsessive compulsive disorder)   . Orbital cellulitis    at age 49 was hospitalized for 3 months (almost died)   . OSA (obstructive sleep apnea)   . Osteoarthritis 10/21/2016  . Personal history of tobacco use, presenting hazards to health 01/20/2014  . Primary osteoarthritis of both knees 12/28/2016  . Right shoulder injury, subsequent encounter 02/01/2018  . RUQ abdominal pain 01/11/2017  . S/P appy 05/2013  . Severe obesity (BMI >= 40) ([place]) 07/07/2015   Formatting of this note might be different from the original. Last Assessment & Plan:  Formatting of this note might be different from the original. Body mass index is 40.21 kg/(m^2).  Lab Results  Component Value Date   TSH 0.562 07/09/2013   Contributing to gerd tendency/ doe/reviewed the need and the process to achieve and maintain neg calorie balance > defer f/u primary care including [facility]  . Sleep apnea    wears cpap   . Tinnitus of both ears 10/11/2018  . Type 2 diabetes mellitus without complication, without long-term current use of insulin ([place]) 10/20/2016    Formatting of this note might be different from the original. Last Assessment & Plan:  Stable, continue diabetic diet.  . Vitamin D deficiency 11/01/2017    Past Surgical History:  Procedure Laterality Date  . APPENDECTOMY    . COLONOSCOPY    . DENTAL SURGERY     wisdom teeth  . ESOPHAGOGASTRODUODENOSCOPY    . LAPAROSCOPIC APPENDECTOMY N/A 06/06/2013   Procedure: APPENDECTOMY LAPAROSCOPIC;  Surgeon: Imogene Burn. Georgette Dover, MD;  Location: [place];  Service: General;  Laterality: N/A;  . LIVER BIOPSY    . UPPER GASTROINTESTINAL ENDOSCOPY       Allergies  Allergen Reactions  . Dust Mite Extract Shortness Of Breath and Cough  . Mold Extract [Trichophyton] Shortness Of Breath and Cough  . Nsaids Other (See Comments)    Patient is to NOT have NSAIDS  . Codeine Nausea Only    Required use of an antiemetic   . Amlodipine Swelling and Other (See Comments)  Edema   . Tape Rash    Medical tape and Band-Aids aren't tolerated   Inpatient Medications    . buPROPion  150 mg Oral Daily  . chlorhexidine  15 mL Mouth Rinse BID  . cholecalciferol  2,000 Units Oral Daily  . clotrimazole   Topical BID  . dicyclomine  20 mg Oral TID AC & HS  . DULoxetine  60 mg Oral Daily  . furosemide  40 mg Oral q morning  . insulin aspart  0-5 Units Subcutaneous QHS  . insulin aspart  0-9 Units Subcutaneous TID WC  . lactulose  10 g Oral BID  . loratadine  10 mg Oral Daily  . losartan  100 mg Oral Daily  . mouth rinse  15 mL Mouth Rinse q12n4p  . [START ON 12/02/2020] metoprolol succinate  50 mg Oral Daily  . mometasone-formoterol  2 puff Inhalation BID  . montelukast  10 mg Oral QHS  . pantoprazole  40 mg Oral BID  . prenatal multivitamin  1 tablet Oral Daily    Family History    Family History  Problem Relation Age of Onset  . Asthma Mother   . Hypertension Mother   . Breast cancer Father 69  . Diabetes Father   . Hypertension Father   . Heart disease Father   . Hyperlipidemia Father   .  Asthma Sister   . Hyperlipidemia Brother   . Hypertension Brother   . Vision loss Brother   . Colon cancer Maternal Grandmother 49  . Liver cancer Maternal Grandmother   . Cervical cancer Maternal Aunt   . Stroke Paternal Uncle 78  . Melanoma Maternal Grandfather 69  . Colon polyps Maternal Grandfather   . AAA (abdominal aortic aneurysm) Paternal Grandmother   . Heart disease Paternal Grandfather   . Heart disease Paternal Uncle 24  . Colon cancer Other 12       MGM's mother  . Stomach cancer Other 95       MGM's mother  . Breast cancer Other        MGM's maternal aunt  . Cancer Other        MGM's maternal uncle with GI cancer  . Esophageal cancer Neg Hx   . Pancreatic cancer Neg Hx   . Rectal cancer Neg Hx    She indicated that her mother is alive. She indicated that her father is deceased. She indicated that her sister is alive. She indicated that her brother is alive. She indicated that her maternal grandmother is deceased. She indicated that her maternal grandfather is deceased. She indicated that her paternal grandmother is deceased. She indicated that her paternal grandfather is deceased. She indicated that both of her maternal aunts are alive. She indicated that her paternal aunt is alive. She indicated that only one of her three paternal uncles is alive. She indicated that the status of her neg hx is unknown. She indicated that all of her three others are deceased.   Social History    Social History   Socioeconomic History  . Marital status: Single    Spouse name: Not on file  . Number of children: 0  . Years of education: Not on file  . Highest education level: Not on file  Occupational History  . Occupation: Ship broker  . Occupation: Programmer, applications  Tobacco Use  . Smoking status: Former Smoker    Packs/day: 1.00    Years: 20.00    Pack years: 20.00    Types:  Cigarettes    Quit date: 02/22/2013    Years since quitting: 7.7  . Smokeless tobacco: Never Used  Vaping  Use  . Vaping Use: Never used  Substance and Sexual Activity  . Alcohol use: Yes    Comment: occ  . Drug use: No  . Sexual activity: Not on file  Other Topics Concern  . Not on file  Social History Narrative   Working at [employer] doing [employer] alone (has a Neurosurgeon)   Working on a degree at [employer], will plan to return fall 2018   Enjoys spending time with family.   Friends and family in [place] Determinants of Health   Financial Resource Strain: Not on file  Food Insecurity: Not on file  Transportation Needs: Not on file  Physical Activity: Not on file  Stress: Not on file  Social Connections: Not on file  Intimate Partner Violence: Not on file     Review of Systems    General:  No chills, fever, night sweats or weight changes.  Cardiovascular:  No chest pain, dyspnea on exertion, edema, orthopnea, palpitations, paroxysmal nocturnal dyspnea. Dermatological: No rash, lesions/masses Respiratory: No cough, dyspnea Urologic: No hematuria, dysuria Abdominal:   No nausea, vomiting, diarrhea, bright red blood per rectum, melena, or hematemesis Neurologic:  No visual changes, wkns, changes in mental status. All other systems reviewed and are otherwise negative except as noted above.  Physical Exam    Blood pressure (!) 124/98, pulse 67, temperature 98.5 F (36.9 C), temperature source Oral, resp. rate 17, height 5' 6" (1.676 m), weight 129.5 kg, last menstrual period 11/15/2020, SpO2 98 %.    No intake or output data in the 24 hours ending 12/01/20 0414 Wt Readings from Last 3 Encounters:  12/01/20 129.5 kg  10/23/20 129.7 kg  09/23/20 132 kg    CONSTITUTIONAL: alert and conversant, well-appearing, nourished, no distress HEENT: oropharynx clear and moist, no mucosal lesions, normal dentition, conjunctiva normal, EOM intact, pupils equal, no lid lag. NECK: supple, no cervical adenopathy, no thyromegaly CARDIOVASCULAR: Regular rhythm. No gallop,  murmur, or rub. Normal S1/S2. Radial pulses intact. JVP flat. No carotid bruits. PULMONARY/CHEST WALL: no deformities, normal breath sounds bilaterally, normal work of breathing ABDOMINAL: soft, non-tender, non-distended EXTREMITIES: no edema or muscle atrophy, warm and well-perfused SKIN: Dry and intact without apparent rashes or wounds. NEUROLOGIC: alert, normal gait, no abnormal movements, cranial nerves grossly intact.   Labs    Troponin (Point of Care Test) No results for input(s): TROPIPOC in the last 72 hours. No results for input(s): CKTOTAL, CKMB, TROPONINI in the last 72 hours. Lab Results  Component Value Date   WBC 13.6 (H) 11/30/2020   HGB 14.2 11/30/2020   HCT 41.3 11/30/2020   MCV 86.9 11/30/2020   PLT 238 11/30/2020    Recent Labs  Lab 11/30/20 1900  NA 137  K 3.5  CL 103  CO2 26  BUN 7  CREATININE 0.77  CALCIUM 8.8*  GLUCOSE 145*   Lab Results  Component Value Date   CHOL 207 (H) 09/23/2020   HDL 39.10 09/23/2020   TRIG 260.0 (H) 09/23/2020   Lab Results  Component Value Date   DDIMER 0.27 09/02/2020     Radiology Studies    DG Chest Port 1 View  Result Date: 11/30/2020 CLINICAL DATA:  Chest pain. EXAM: PORTABLE CHEST 1 VIEW COMPARISON:  September 02, 2020 FINDINGS: The heart size and mediastinal contours are within normal  limits. Both lungs are clear. The visualized skeletal structures are unremarkable. IMPRESSION: No acute or active cardiopulmonary disease. Electronically Signed   By: Virgina Norfolk M.D.   On: 11/30/2020 19:43    ECG & Cardiac Imaging   As above ECG reviewed, normal sinus rhtym and normal ECG. Last TTE normal LVEF, mild AI, mild RV systolic dysfunction.  Known bicuspid AV from prior.  Assessment & Plan    40 year old female with history of HTN, T2DM, morbid obesity, OSA, bicuspid AV w/ mild AI on whom we are consulted to assist with evaluation of chest pain.  Her chest pain has some typical and atypical features qualifying  as possibly cardiac chest pain.  Her ECG and negative hsTropI are reassuring that this is not consistent with ACS.  Given risk factors it's reasonable to further risk evaluate.  I favor CCTA as first test for possibly cardiac chest pain but would recommend treating for type I event/ACS at this time.  She did have a hiatal hernia seen on her recent CT PE protocol which could be contributing to her symptoms.  Given no change in SOB symptoms and recent TTE would not recommend updating now and can continue with normal monitoring protocol with Dr. Geraldo Pitter.  Problem list CP HTN Bivcuspid AV Mild AI Morbid obesity HTN T2DM HLD  Recommendations #CP #Palpitations - ASA 81 mg daily, start moderate intensity statin (diabetic) - CCTA - Monitor on telemetry. - If above are unrevealing, outpatient monitor would be reasonably.  #HTN - Continue losartan 100 mg daily - Continue metoprolol succinate 50 mg daily, furosemide (apparently this regimen works really well for her)  #Bicuspid AV #Mild AI - Continue follow-up with Dr. Geraldo Pitter  #T2DM #HLD - Lerry [place], can be initiated as outpatient with metformin offering little benefit. - ASA, statin for primary prevention as above.  Signed, Delight Hoh, MD 12/01/2020, 4:14 AM  For questions or updates, please contact   Please consult [email or web address] for contact info under Cardiology/STEMI.

## 2020-12-01 NOTE — H&P (Addendum)
History and Physical  Stephanie Miles PTW:[id number] DOB: [birth year] DOA: 11/30/2020  Referring physician: Accepted as a direct admit from [facility] ED by Dr. Myna Hidalgo, [facility]. PCP: Debbrah Alar, NP  Outpatient Specialists: Cardiology, GI, neurology. Patient coming from: Home.  Chief Complaint: Chest pain  HPI: Stephanie Miles is a 40 y.o. female with medical history significant for type 2 diabetes, severe morbid obesity, essential hypertension, hyperlipidemia, chronic anxiety/depression, GERD, NASH, OSA on CPAP who presented initially to [facility] ED due to nonexertional substernal chest pain, sharp, involving her jaw bilaterally, 8 out of 10 in intensity associated with nausea but no vomiting.  No exacerbating or improving factors.  No palpitations.  Lasted 2 hours.  She received a full dose aspirin 324 mg x 1.  Also received sublingual nitroglycerin.  EDP at [facility] requested transfer to [facility] for further cardiac work-up.  No acute ischemic changes on twelve-lead EKG, no acute chest x-ray findings, troponin negative x2.  At the time of this visit her substernal chest pain had resolved however bilateral jaw pain persists.  Cardiology fellow Dr. Blossom Hoops was consulted.  ED Course:  Direct admit from [facility] to [facility] due to chest pain to rule out ACS.  Review of Systems: Review of systems as noted in the HPI. All other systems reviewed and are negative.   Past Medical History:  Diagnosis Date  . Allergy   . ANA positive 12/28/2016  . Anxiety   . Anxiety state 10/16/2013  . Aortic regurgitation due to bicuspid aortic valve   . Arthritis   . Asthma    allergy induced asthma   . Black stools 09/13/2017  . Borderline diabetes 10/20/2016  . Chronic kidney disease    kidney stones  . Cough variant asthma vs uacs  07/07/2015   Spirometry 07/06/2015   Purely restrictive so rec trial off qvar and rx dulera 100 2bid for flare off qvar > no flares on gerd rx as of 08/11/2015      . Depression    . Diabetes type 2, controlled ([place]) 10/20/2016  . Diarrhea 06/20/2017  . Dyspnea 07/07/2015   Spirometry 07/06/2015 purely restrictive   . Elevated LFTs 01/11/2017  . Essential hypertension 06/06/2013   Formatting of this note might be different from the original. Last Assessment & Plan:  Stable off of meds, monitor.  . Eustachian tube dysfunction, bilateral 10/10/2018   Last Assessment & Plan:  Concern over her ears. Chronic history of gradual hearing loss.  No significant history of ear infections, surgery or trauma.  Her ears pop frequently and feel full.  There is some associated bilateral ringing.  She wants to understand is there anything that can be done to improve this.  Recent audiogram is reviewed and shows a  mild to severe mixed hearing loss. EXAM show  . Family history of breast cancer in female   . Family history of colon cancer   . Fatty liver 11/28/2016  . Fibromyalgia 04/04/2019  . Genetic testing 02/28/2017   Negative genetic testing on the common hereditary cancer panel.  The Hereditary Gene Panel offered by [facility] includes sequencing and/or deletion duplication testing of the following 46 genes: APC, ATM, AXIN2, BARD1, BMPR1A, BRCA1, BRCA2, BRIP1, CDH1, CDKN2A (p14ARF), CDKN2A (p16INK4a), CHEK2, CTNNA1, DICER1, EPCAM (Deletion/duplication testing only), GREM1 (promoter region deletion/duplication te  . GERD (gastroesophageal reflux disease)   . Heart murmur    bicuspid AV per pt   . Hepatocellular carcinoma ([place]) 01/31/2018  . History of chemotherapy  TACE for liver cancer   . History of kidney stones 10/21/2016  . Hyperlipidemia   . Hypertension   . Hypertriglyceridemia 04/05/2018  . IBS (irritable bowel syndrome)   . Insomnia   . Iron deficiency anemia due to chronic blood loss 03/21/2017  . Left leg injury, initial encounter 02/01/2018  . Leukocytosis 12/06/2016  . Liver cancer ([place]) 03/2017  . Liver lesion 06/20/2017  . Migraine 07/08/2013  . Migraine headache   .  Migraine, unspecified, not intractable, without status migrainosus 07/08/2013   Formatting of this note might be different from the original. Last Assessment & Plan:  Stable, continue prn imitrex/zofran.  . 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 NASH (nonalcoholic steatohepatitis) 04/04/2017   Per biopsy 9/18  . Neuromuscular disorder ([place])    very mild neuropathy hands  feet   . OCD (obsessive compulsive disorder)   . Orbital cellulitis    at age 66 was hospitalized for 3 months (almost died)   . OSA (obstructive sleep apnea)   . Osteoarthritis 10/21/2016  . Personal history of tobacco use, presenting hazards to health 01/20/2014  . Primary osteoarthritis of both knees 12/28/2016  . Right shoulder injury, subsequent encounter 02/01/2018  . RUQ abdominal pain 01/11/2017  . S/P appy 05/2013  . Severe obesity (BMI >= 40) ([place]) 07/07/2015   Formatting of this note might be different from the original. Last Assessment & Plan:  Formatting of this note might be different from the original. Body mass index is 40.21 kg/(m^2).  Lab Results  Component Value Date   TSH 0.562 07/09/2013   Contributing to gerd tendency/ doe/reviewed the need and the process to achieve and maintain neg calorie balance > defer f/u primary care including intermit  . Sleep apnea    wears cpap   . Tinnitus of both ears 10/11/2018  . Type 2 diabetes mellitus without complication, without long-term current use of insulin ([place]) 10/20/2016   Formatting of this note might be different from the original. Last Assessment & Plan:  Stable, continue diabetic diet.  . Vitamin D deficiency 11/01/2017   Past Surgical History:  Procedure Laterality Date  . APPENDECTOMY    . COLONOSCOPY    . DENTAL SURGERY     wisdom teeth  . ESOPHAGOGASTRODUODENOSCOPY    . LAPAROSCOPIC APPENDECTOMY N/A 06/06/2013   Procedure: APPENDECTOMY LAPAROSCOPIC;  Surgeon: Imogene Burn. Georgette Dover, MD;  Location: [facility];  Service: General;   Laterality: N/A;  . LIVER BIOPSY    . UPPER GASTROINTESTINAL ENDOSCOPY      Social History:  reports that she quit smoking about 7 years ago. Her smoking use included cigarettes. She has a 20.00 pack-year smoking history. She has never used smokeless tobacco. She reports current alcohol use. She reports that she does not use drugs.   Allergies  Allergen Reactions  . Dust Mite Extract Shortness Of Breath and Cough  . Mold Extract [Trichophyton] Shortness Of Breath and Cough  . Nsaids Other (See Comments)    Patient is to NOT have NSAIDS  . Codeine Nausea Only    Required use of an antiemetic   . Amlodipine Swelling and Other (See Comments)    Edema   . Tape Rash    Medical tape and Band-Aids aren't tolerated    Family History  Problem Relation Age of Onset  . Asthma Mother   . Hypertension Mother   . Breast cancer Father 56  . Diabetes Father   . Hypertension  Father   . Heart disease Father   . Hyperlipidemia Father   . Asthma Sister   . Hyperlipidemia Brother   . Hypertension Brother   . Vision loss Brother   . Colon cancer Maternal Grandmother 56  . Liver cancer Maternal Grandmother   . Cervical cancer Maternal Aunt   . Stroke Paternal Uncle 69  . Melanoma Maternal Grandfather 49  . Colon polyps Maternal Grandfather   . AAA (abdominal aortic aneurysm) Paternal Grandmother   . Heart disease Paternal Grandfather   . Heart disease Paternal Uncle 70  . Colon cancer Other 58       MGM's mother  . Stomach cancer Other 42       MGM's mother  . Breast cancer Other        MGM's maternal aunt  . Cancer Other        MGM's maternal uncle with GI cancer  . Esophageal cancer Neg Hx   . Pancreatic cancer Neg Hx   . Rectal cancer Neg Hx       Prior to Admission medications   Medication Sig Start Date End Date Taking? Authorizing Provider  Accu-Chek FastClix Lancets MISC Check sugar twice daily 09/23/20   Debbrah Alar, NP  acetaminophen (TYLENOL) 500 MG tablet Take  500-1,000 mg by mouth every 6 (six) hours as needed (for pain).     [provider]  AIMOVIG 70 MG/ML SOAJ INJECT 70MG INTO THE SKIN ONCE EVERY 30 DAYS 08/07/20   Tomi Likens, Adam R, DO  albuterol (VENTOLIN HFA) 108 (90 Base) MCG/ACT inhaler Inhale 2 puffs into the lungs every 6 (six) hours as needed for wheezing or shortness of breath.    [provider]  ALPRAZolam Duanne Moron) 0.5 MG tablet TAKE ONE TABLET BY MOUTH TWICE A DAY AS NEEDED 11/13/20   Debbrah Alar, NP  benzoyl peroxide-erythromycin [facility]) gel Apply topically 2 (two) times daily. 01/06/20   Debbrah Alar, NP  Blood Glucose Monitoring Suppl (ACCU-CHEK GUIDE) w/Device KIT 1 each by Does not apply route daily. 01/09/17   Debbrah Alar, NP  budesonide-formoterol (SYMBICORT) 160-4.5 MCG/ACT inhaler INHALE TWO PUFFS BY MOUTH TWICE A DAY 04/21/20   Debbrah Alar, NP  buPROPion (WELLBUTRIN XL) 150 MG 24 hr tablet TAKE ONE TABLET BY MOUTH DAILY 11/13/20   Debbrah Alar, NP  cetirizine (ZYRTEC) 10 MG tablet Take 10 mg by mouth daily.    [provider]  Cholecalciferol (VITAMIN D3) 50 MCG (2000 UT) TABS Take 2,000 Units by mouth daily.    [provider]  clotrimazole-betamethasone (LOTRISONE) cream Apply 1 application topically 2 (two) times daily. 11/13/20   Debbrah Alar, NP  dicyclomine (BENTYL) 20 MG tablet TAKE ONE TABLET BY MOUTH FOUR TIMES A DAY BEFORE MEALS AND AT BEDTIME AS DIRECTED 08/07/20   Debbrah Alar, NP  DULoxetine (CYMBALTA) 60 MG capsule TAKE ONE CAPSULE BY MOUTH DAILY 11/13/20   Debbrah Alar, NP  fluticasone (FLONASE) 50 MCG/ACT nasal spray Place 2 sprays into both nostrils daily. 04/02/20   Saguier, Percell Miller, PA-C  furosemide (LASIX) 40 MG tablet TAKE ONE TABLET BY MOUTH EVERY MORNING 01/15/20   Debbrah Alar, NP  glucose blood (ACCU-CHEK GUIDE) test strip USE TO CHECK BLOOD SUGAR TWO TIMES A DAY 09/23/20   Debbrah Alar, NP  Lactulose 20 GM/30ML  SOLN 15 ML by mouth twice daily.  May titrate as needed to 21m three times daily with goal of 2-3 soft BM's/day 05/09/19   ODebbrah Alar NP  losartan (COZAAR) 100 MG  tablet TAKE ONE TABLET BY MOUTH DAILY 11/13/20   Debbrah Alar, NP  metFORMIN (GLUCOPHAGE-XR) 500 MG 24 hr tablet TAKE TWO TABLETS BY MOUTH DAILY WITH BREAKFAST 01/15/20   Debbrah Alar, NP  metoprolol succinate (TOPROL-XL) 100 MG 24 hr tablet TAKE ONE TABLET BY MOUTH DAILY OR IMMEDIATELY FOLLOWING A MEAL AS DIRECTED 01/15/20   Debbrah Alar, NP  montelukast (SINGULAIR) 10 MG tablet TAKE ONE TABLET BY MOUTH EVERY NIGHT AT BEDTIME 05/29/20   Debbrah Alar, NP  Multiple Vitamins-Minerals (MULTIVITAMIN WITH MINERALS) tablet Take 1 tablet by mouth daily.    [provider]  nystatin (NYSTATIN) powder Apply topically 2 (two) times daily. 11/23/18   Debbrah Alar, NP  omeprazole (PRILOSEC) 20 MG capsule TAKE TWO CAPSULES BY MOUTH EVERY MORNING BEFORE BREAKFAST AND TAKE ONE CAPSULE BY MOUTH EVERY EVENING BEFORE SUPPER 08/07/20   Debbrah Alar, NP  ondansetron (ZOFRAN-ODT) 4 MG disintegrating tablet DISSOLVE ONE TABLET BY MOUTH EVERY 8 HOURS AS NEEDED FOR NAUSEA OR VOMITING 11/13/20   Debbrah Alar, NP  potassium chloride (KLOR-CON) 10 MEQ tablet TAKE ONE TABLET BY MOUTH EVERY OTHER DAY 05/29/20   Debbrah Alar, NP  Prenatal Vit-Fe Fumarate-FA (MULTIVITAMIN-PRENATAL) 27-0.8 MG TABS tablet Take 1 tablet by mouth daily.     [provider]  UBRELVY 100 MG TABS TAKE ONE TABLET BY MOUTH AS NEEDED. MAY REPEAT IN TWO HOURS. MAXIMUM OF TWO TABLETS IN A 24 HOUR PERIOD. 08/07/20   Pieter Partridge, DO    Physical Exam: BP (!) 124/98 (BP Location: Left Arm)   Pulse 67   Temp 98.5 F (36.9 C) (Oral)   Resp 17   Ht _0  (1.676 m)   Wt 129.5 kg   LMP 11/15/2020   SpO2 98%   BMI 46.08 kg/m   . General: 40 y.o. year-old female severely morbidly obese in no acute distress.  Alert and  oriented x3. . Cardiovascular: Regular rate and rhythm with no rubs or gallops.  No thyromegaly or JVD noted.  Trace lower extremity edema. 2/4 pulses in all 4 extremities. Marland Kitchen Respiratory: Clear to auscultation with no wheezes or rales. Good inspiratory effort. . Abdomen: Soft nontender nondistended with normal bowel sounds x4 quadrants. . Muskuloskeletal: No cyanosis or clubbing.  Trace lower extremity edema bilaterally. . Neuro: CN II-XII intact, strength, sensation, reflexes . Skin: No ulcerative lesions noted or rashes . Psychiatry: Judgement and insight appear normal. Mood is appropriate for condition and setting          Labs on Admission:  Basic Metabolic Panel: Recent Labs  Lab 11/30/20 1900  NA 137  K 3.5  CL 103  CO2 26  GLUCOSE 145*  BUN 7  CREATININE 0.77  CALCIUM 8.8*   Liver Function Tests: No results for input(s): AST, ALT, ALKPHOS, BILITOT, PROT, ALBUMIN in the last 168 hours. No results for input(s): LIPASE, AMYLASE in the last 168 hours. No results for input(s): AMMONIA in the last 168 hours. CBC: Recent Labs  Lab 11/30/20 1900  WBC 13.6*  NEUTROABS 8.5*  HGB 14.2  HCT 41.3  MCV 86.9  PLT 238   Cardiac Enzymes: No results for input(s): CKTOTAL, CKMB, CKMBINDEX, TROPONINI in the last 168 hours.  BNP (last 3 results) Recent Labs    09/02/20 1236  BNP 10.8    ProBNP (last 3 results) No results for input(s): PROBNP in the last 8760 hours.  CBG: No results for input(s): GLUCAP in the last 168 hours.  Radiological Exams on Admission: DG  Chest Port 1 View  Result Date: 11/30/2020 CLINICAL DATA:  Chest pain. EXAM: PORTABLE CHEST 1 VIEW COMPARISON:  September 02, 2020 FINDINGS: The heart size and mediastinal contours are within normal limits. Both lungs are clear. The visualized skeletal structures are unremarkable. IMPRESSION: No acute or active cardiopulmonary disease. Electronically Signed   By: Virgina Norfolk M.D.   On: 11/30/2020 19:43     EKG: I independently viewed the EKG done and my findings are as followed: Sinus rhythm rate of 78.  Nonspecific ST-T changes.  QTc 426.  Assessment/Plan Present on Admission: . Chest pain at rest  Active Problems:   Chest pain at rest  Atypical chest pain rule out ACS. Presented with nonexertional substernal, sharp chest pain. involving her jaw bilaterally. Associated with nausea and no vomiting. First-degree relative with history of early cardiovascular disease, father had an MI at the age of 40. Work-up thus far unrevealing. TIMI score 1. Troponin negative x2 No evidence of acute ischemia on twelve-lead EKG. Has received a full dose aspirin Obtain fasting lipid panel in the morning Cardiology consulted Management per cardiology.  Leukocytosis, suspect reactive in the setting of acute illness. WBC 13.6 on presentation Nonseptic appearing Repeat CBC in the morning  Type 2 diabetes with hyperglycemia A1c 6.5 on 12/01/2020 Insulin sliding scale  GERD Resume home PPI  Chronic anxiety/depression Resume home regimen.  NASH No acute issues. Resume home lactulose  COPD Resume home regimen  OSA CPAP nightly  Severe morbid obesity BMI 46 Recommend weight loss outpatient with regular physical activity and healthy dieting.    DVT prophylaxis: Subcu Lovenox daily.  Code Status: Full code.  Family Communication: None at bedside.  Disposition Plan: Admit to telemetry cardiac.  Consults called: Cardiology.  Admission status: Observation status.   Status is: Observation    Dispo:  Patient From: Home  Planned Disposition: Home, likely on 12/01/2020 or when cardiology signs off.  Medically stable for discharge: No      Kayleen Memos MD [facility] Pager [phone]  If 7PM-7AM, please contact night-coverage [email or web address] Password [email or web address]  12/01/2020, 1:54 AM

## 2020-12-01 NOTE — Progress Notes (Signed)
RT set up patient CPAP at beside. Patient is able to place herself on when she is ready.

## 2020-12-01 NOTE — Progress Notes (Signed)
RT note. CPAP in patients room , patient will place self on CPAP when ready for bed.

## 2020-12-01 NOTE — Progress Notes (Addendum)
Progress Note  Patient Name: Lashala Laser Date of Encounter: 12/01/2020  [facility] Cardiologist: Jenean Lindau, MD   Subjective   No more CP, agrees w/ plan  Inpatient Medications    Scheduled Meds:  buPROPion  150 mg Oral Daily   chlorhexidine  15 mL Mouth Rinse BID   cholecalciferol  2,000 Units Oral Daily   clotrimazole   Topical BID   dicyclomine  20 mg Oral TID AC & HS   DULoxetine  60 mg Oral Daily   enoxaparin (LOVENOX) injection  40 mg Subcutaneous Daily   furosemide  40 mg Oral q morning   insulin aspart  0-5 Units Subcutaneous QHS   insulin aspart  0-9 Units Subcutaneous TID WC   lactulose  10 g Oral BID   loratadine  10 mg Oral Daily   losartan  100 mg Oral Daily   mouth rinse  15 mL Mouth Rinse q12n4p   [START ON 12/02/2020] metoprolol succinate  50 mg Oral Daily   mometasone-formoterol  2 puff Inhalation BID   montelukast  10 mg Oral QHS   pantoprazole  40 mg Oral BID   prenatal multivitamin  1 tablet Oral Daily   Continuous Infusions:   PRN Meds: acetaminophen, albuterol, ALPRAZolam, nitroGLYCERIN, Ubrogepant   Vital Signs    Vitals:   12/01/20 0139 12/01/20 0358 12/01/20 0449 12/01/20 0742  BP: (!) 124/98  126/64 118/60  Pulse: 67 66 61 69  Resp:   20 20  Temp: 98.5 F (36.9 C)  98.6 F (37 C) 98.3 F (36.8 C)  TempSrc: Oral  Oral Oral  SpO2: 98% 99% 99% 93%  Weight: 129.5 kg     Height: 5' 6"  (1.676 m)       Intake/Output Summary (Last 24 hours) at 12/01/2020 0914 Last data filed at 12/01/2020 0800 Gross per 24 hour  Intake 360 ml  Output --  Net 360 ml   Last 3 Weights 12/01/2020 11/30/2020 10/23/2020  Weight (lbs) 285 lb 8 oz 281 lb 286 lb  Weight (kg) 129.502 kg 127.461 kg 129.729 kg      Telemetry    SR - Personally Reviewed  ECG    None today - Personally Reviewed  Physical Exam   GEN: No acute distress.   Neck: No JVD Cardiac: RRR, no murmurs, rubs, or gallops.  Respiratory: Clear to auscultation  bilaterally. GI: Soft, nontender, non-distended  MS: No edema; No deformity. Neuro:  Nonfocal  Psych: Normal affect   Labs    High Sensitivity Troponin:   Recent Labs  Lab 11/30/20 1900 11/30/20 2051  TROPONINIHS 2 2      Chemistry Recent Labs  Lab 11/30/20 1900 12/01/20 0520  NA 137 137  K 3.5 3.6  CL 103 101  CO2 26 27  GLUCOSE 145* 142*  BUN 7 5*  CREATININE 0.77 0.88  CALCIUM 8.8* 8.9  GFRNONAA >60 >60  ANIONGAP 8 9     Hematology Recent Labs  Lab 11/30/20 1900 12/01/20 0520  WBC 13.6* 17.9*  RBC 4.75 4.70  HGB 14.2 13.9  HCT 41.3 41.6  MCV 86.9 88.5  MCH 29.9 29.6  MCHC 34.4 33.4  RDW 13.1 13.2  PLT 238 223   Lab Results  Component Value Date   CHOL 207 (H) 09/23/2020   HDL 39.10 09/23/2020   LDLDIRECT 139.0 09/23/2020   TRIG 260.0 (H) 09/23/2020   CHOLHDL 5 09/23/2020   Lab Results  Component Value Date   TSH  1.51 01/06/2020   Lab Results  Component Value Date   HGBA1C 6.5 (H) 12/01/2020    BNPNo results for input(s): BNP, PROBNP in the last 168 hours.   DDimer No results for input(s): DDIMER in the last 168 hours.   Radiology    DG Chest Port 1 View  Result Date: 11/30/2020 CLINICAL DATA:  Chest pain. EXAM: PORTABLE CHEST 1 VIEW COMPARISON:  September 02, 2020 FINDINGS: The heart size and mediastinal contours are within normal limits. Both lungs are clear. The visualized skeletal structures are unremarkable. IMPRESSION: No acute or active cardiopulmonary disease. Electronically Signed   By: Virgina Norfolk M.D.   On: 11/30/2020 19:43    Cardiac Studies   CARDIAC CT ordered  ECHO: 06/26/2020  1. Left ventricular ejection fraction, by estimation, is 60 to 65%. The  left ventricle has normal function. The left ventricle has no regional  wall motion abnormalities. There is mild concentric left ventricular  hypertrophy. Left ventricular diastolic  parameters are consistent with Grade II diastolic dysfunction   (pseudonormalization).   2. There is the interventricular septum is flattened in systole,  consistent with right ventricular pressure overload.   3. Right ventricular systolic function is low normal. The right  ventricular size is mildly enlarged.   4. The mitral valve is normal in structure. No evidence of mitral valve  regurgitation. No evidence of mitral stenosis.   5. The aortic valve was not well visualized. Aortic valve regurgitation  is mild. No aortic stenosis is present.   6. Unable to determine Pulmonary artery systolic pressure, No TR jet  spectral display.  ECHO: 11/28/2017  - Left ventricle: The cavity size was normal. Systolic function was    normal. The estimated ejection fraction was in the range of 55%    to 60%. Wall motion was normal; there were no regional wall    motion abnormalities.  - Aortic valve: Bicuspid; normal thickness leaflets. There was mild    regurgitation. Valve area (VTI): 2.02 cm^2. Valve area (Vmax):    2.07 cm^2. Valve area (Vmean): 1.99 cm^2.   Impressions:   - Normal LVEF and size,    Bicuspid aortic valve    Mild AR.   Patient Profile     40 y.o. female with a history of bicuspid AoV, T2DM, HTN, HLD, GERD, NASH, morbid obesity, who presented 05/09 with chest pain   Assessment & Plan    1. Chest pain - for CT today, Marchia Bond, RN coordinating - to get 18 ga IV and Lopressor 100 mg x 1 - hold usual Toprol XL 50 mg today,restart tomorrow - f/u on results  2. HLD - LDL 139 - Not on statin pta - MD advise on starting one now or wait on CT results to determine goal LDL  3. HTN - BP well-controlled on current rx, but feel she will tolerate one-time dose Lopressor   Active Problems:   Chest pain at rest       For questions or updates, please contact [place] Please consult [email or web address] for contact info under        Signed, Rosaria Ferries, PA-C  12/01/2020, 9:14 AM    Patient examined chart reviewed Discussed  care with patient and PA. Exam with obese white female. Clear lungs distant Heart sounds soft abdomen trace edema ECG with no acute changes troponin negative CXR NAD. Cardiology fellow had ordered cardiac CTA. I think this is reasonable and a good test given her AV dx Will do  retrospective scan from clavicles down to assess entire aorta, r/o coarctation aneurysm in setting of bicuspid valve and evaluate valve morphology Described Test to patient May need IV team to start antecubital she is a difficult stick Has had CT scans and contrast before for liver issues. Lopressor 100 mg oral now HR  In 70's Further recommendations based on CTA results  Jenkins Rouge MD [facility]

## 2020-12-02 DIAGNOSIS — R079 Chest pain, unspecified: Secondary | ICD-10-CM | POA: Diagnosis not present

## 2020-12-02 DIAGNOSIS — Q231 Congenital insufficiency of aortic valve: Secondary | ICD-10-CM

## 2020-12-02 LAB — GLUCOSE, CAPILLARY: Glucose-Capillary: 133 mg/dL — ABNORMAL HIGH (ref 70–99)

## 2020-12-02 MED ORDER — ALBUTEROL SULFATE HFA 108 (90 BASE) MCG/ACT IN AERS: 2.0000 | INHALATION_SPRAY | Freq: Four times a day (QID) | RESPIRATORY_TRACT | Status: AC | PRN

## 2020-12-02 NOTE — Discharge Summary (Signed)
Physician Discharge Summary  Stephanie Miles JOA:[id number] DOB: 02/01/1981 DOA: 11/30/2020  PCP: Debbrah Alar, NP  Admit date: 11/30/2020 Discharge date: 12/02/2020  Admitted From: Home Disposition: Home    Recommendations for Outpatient Follow-up:  1. Follow up with PCP in 1-2 weeks 2. Follow-up with cardiology, Dr. Geraldo Pitter 3. Consider initiation of statin therapy for elevated LDL, patient wants to discuss with PCP  Home Health: No Equipment/Devices: None  Discharge Condition: Stable CODE STATUS: Full code Diet recommendation: Heart healthy diet  History of present illness:  Stephanie Miles is a 40 y.o. female with medical history significant for type 2 diabetes, severe morbid obesity, essential hypertension, hyperlipidemia, chronic anxiety/depression, GERD, NASH, OSA on CPAP who presented initially to [facility] ED due to nonexertional substernal chest pain, sharp, involving her jaw bilaterally, 8 out of 10 in intensity associated with nausea but no vomiting.  No exacerbating or improving factors.  No palpitations.  Lasted 2 hours.  She received a full dose aspirin 324 mg x 1.  Also received sublingual nitroglycerin.  EDP at [facility] requested transfer to [facility] for further cardiac work-up.  No acute ischemic changes on twelve-lead EKG, no acute chest x-ray findings, troponin negative x2.  At the time of this visit her substernal chest pain had resolved however bilateral jaw pain persists.  Cardiology was consulted.  Hospitalist service consulted for further evaluation and management.  Hospital course:  Atypical non-cardiac chest pain Patient initially presenting to [facility] with persistent sharp chest pain with radiation to bilateral jaws.  Associated with nausea.  No known exacerbating or alleviating factors.  EKG with no acute ischemic changes.  Chest x-ray with no acute cardiopulmonary disease process.  High-sensitivity troponin negative.   Cardiology was consulted.  TTE with LVEF 55-80%, no regional wall motion abnormalities.  Patient underwent CT coronary with calcium score equal to 0.  No further recommendations per cardiology, chest pain resolved.  Outpatient follow-up with cardiology, Dr. Geraldo Pitter.  Type 2 diabetes mellitus Hemoglobin A1c 6.5 on 12/01/2020.  1000 mg p.o. daily.  Essential hypertension: Metoprolol succinate 100 mg p.o. daily, losartan milligrams p.o. daily, furosemide 40 mg p.o. daily.  GERD: Continue PPI  Anxiety/depression: Wellbutrin 150 mg p.o. daily, Xanax 0.5 mg twice daily as needed  NASH cirrhosis Outpatient follow-up with gastroenterology.  Continue home lactulose.  COPD Montelukast 10 mg p.o. nightly, Dulera 2 puffs twice daily, albuterol as needed.  Morbid obesity Body mass index is 46.08 kg/m.  Discussed with patient needs for aggressive lifestyle changes/weight loss as this complicates all facets of care.  Outpatient follow-up with PCP.  May benefit from bariatric evaluation outpatient.   Discharge Diagnoses:  Active Problems:   Bicuspid aortic valve    Discharge Instructions  Discharge Instructions    Call MD for:  difficulty breathing, headache or visual disturbances   Complete by: As directed    Call MD for:  extreme fatigue   Complete by: As directed    Call MD for:  persistant dizziness or light-headedness   Complete by: As directed    Call MD for:  persistant nausea and vomiting   Complete by: As directed    Call MD for:  severe uncontrolled pain   Complete by: As directed    Call MD for:  temperature >100.4   Complete by: As directed    Diet - low sodium heart healthy   Complete by: As directed    Increase activity slowly   Complete by: As directed  Allergies as of 12/02/2020      Reactions   Dust Mite Extract Shortness Of Breath, Cough   Mold Extract [trichophyton] Shortness Of Breath, Cough   Nsaids Other (See Comments)   Patient is to NOT have NSAIDS    Codeine Nausea Only   Required use of an antiemetic   Amlodipine Swelling, Other (See Comments)   Edema   Tape Rash   Medical tape and Band-Aids aren't tolerated      Medication List    TAKE these medications   Accu-Chek FastClix Lancets Misc Check sugar twice daily   Accu-Chek Guide test strip Generic drug: glucose blood USE TO CHECK BLOOD SUGAR TWO TIMES A DAY   Accu-Chek Guide w/Device Kit 1 each by Does not apply route daily.   acetaminophen 500 MG tablet Commonly known as: TYLENOL Take 500 mg by mouth every 6 (six) hours as needed for mild pain, fever or headache.   Aimovig 70 MG/ML Soaj Generic drug: Erenumab-aooe INJECT 70MG INTO THE SKIN ONCE EVERY 30 DAYS What changed: See the new instructions.   albuterol 108 (90 Base) MCG/ACT inhaler Commonly known as: VENTOLIN HFA Inhale 2 puffs into the lungs every 6 (six) hours as needed for wheezing or shortness of breath.   ALPRAZolam 0.5 MG tablet Commonly known as: XANAX TAKE ONE TABLET BY MOUTH TWICE A DAY AS NEEDED What changed: reasons to take this   budesonide-formoterol 160-4.5 MCG/ACT inhaler Commonly known as: Symbicort INHALE TWO PUFFS BY MOUTH TWICE A DAY What changed:   how much to take  how to take this  when to take this  additional instructions   buPROPion 150 MG 24 hr tablet Commonly known as: WELLBUTRIN XL TAKE ONE TABLET BY MOUTH DAILY   cetirizine 10 MG tablet Commonly known as: ZYRTEC Take 10 mg by mouth daily.   clotrimazole-betamethasone cream Commonly known as: Lotrisone Apply 1 application topically 2 (two) times daily. What changed:   when to take this  reasons to take this   dicyclomine 20 MG tablet Commonly known as: BENTYL TAKE ONE TABLET BY MOUTH FOUR TIMES A DAY BEFORE MEALS AND AT BEDTIME AS DIRECTED What changed:   how much to take  how to take this  when to take this  reasons to take this  additional instructions   DULoxetine 60 MG capsule Commonly  known as: CYMBALTA TAKE ONE CAPSULE BY MOUTH DAILY   fluticasone 50 MCG/ACT nasal spray Commonly known as: FLONASE Place 2 sprays into both nostrils daily.   furosemide 40 MG tablet Commonly known as: LASIX TAKE ONE TABLET BY MOUTH EVERY MORNING What changed: when to take this   Lactulose 20 GM/30ML Soln 15 ML by mouth twice daily.  May titrate as needed to 71m three times daily with goal of 2-3 soft BM's/day What changed:   how much to take  how to take this  when to take this  additional instructions   losartan 100 MG tablet Commonly known as: COZAAR TAKE ONE TABLET BY MOUTH DAILY   metFORMIN 500 MG 24 hr tablet Commonly known as: GLUCOPHAGE-XR TAKE TWO TABLETS BY MOUTH DAILY WITH BREAKFAST What changed: See the new instructions.   metoprolol succinate 100 MG 24 hr tablet Commonly known as: TOPROL-XL TAKE ONE TABLET BY MOUTH DAILY OR IMMEDIATELY FOLLOWING A MEAL AS DIRECTED What changed: See the new instructions.   montelukast 10 MG tablet Commonly known as: SINGULAIR TAKE ONE TABLET BY MOUTH EVERY NIGHT AT BEDTIME   multivitamin-prenatal 27-0.8  MG Tabs tablet Take 1 tablet by mouth daily.   naproxen sodium 220 MG tablet Commonly known as: ALEVE Take 440 mg by mouth 2 (two) times daily as needed (headache/pain).   nystatin powder Commonly known as: nystatin Apply topically 2 (two) times daily. What changed:   how much to take  when to take this  reasons to take this   omeprazole 20 MG capsule Commonly known as: PRILOSEC TAKE TWO CAPSULES BY MOUTH EVERY MORNING BEFORE BREAKFAST AND TAKE ONE CAPSULE BY MOUTH EVERY EVENING BEFORE SUPPER What changed:   how much to take  how to take this  when to take this  additional instructions   ondansetron 4 MG disintegrating tablet Commonly known as: ZOFRAN-ODT DISSOLVE ONE TABLET BY MOUTH EVERY 8 HOURS AS NEEDED FOR NAUSEA OR VOMITING What changed: See the new instructions.   potassium chloride 10  MEQ tablet Commonly known as: KLOR-CON TAKE ONE TABLET BY MOUTH EVERY OTHER DAY   Ubrelvy 100 MG Tabs Generic drug: Ubrogepant TAKE ONE TABLET BY MOUTH AS NEEDED. MAY REPEAT IN TWO HOURS. MAXIMUM OF TWO TABLETS IN A 24 HOUR PERIOD. What changed: See the new instructions.   Vitamin D3 50 MCG (2000 UT) Tabs Take 2,000 Units by mouth daily.       Follow-up Information    Debbrah Alar, NP. Schedule an appointment as soon as possible for a visit in 1 week(s).   Specialty: Internal Medicine Contact information: [address] [place] [address] [phone]        Revankar, Reita Cliche, MD. Schedule an appointment as soon as possible for a visit in 2 week(s).   Specialty: Cardiology Contact information: [place] [address] [phone]              Allergies  Allergen Reactions  . Dust Mite Extract Shortness Of Breath and Cough  . Mold Extract [Trichophyton] Shortness Of Breath and Cough  . Nsaids Other (See Comments)    Patient is to NOT have NSAIDS  . Codeine Nausea Only    Required use of an antiemetic   . Amlodipine Swelling and Other (See Comments)    Edema   . Tape Rash    Medical tape and Band-Aids aren't tolerated    Consultations:  Cardiology   Procedures/Studies: CT CORONARY MORPH W/CTA COR W/SCORE W/CA W/CM &/OR WO/CM  Addendum Date: 12/01/2020   ADDENDUM REPORT: 12/01/2020 16:03 EXAM: Cardiac/Coronary  CT TECHNIQUE: The patient was scanned on a [employer]. FINDINGS: A 120 kV prospective scan was triggered in the descending thoracic aorta at 111 HU's. Axial non-contrast 3 mm slices were carried out through the heart. The data set was analyzed on a dedicated work station and scored using the [place]. Gantry rotation speed was 250 msecs and collimation was .6 mm. No beta blockade and 0.8 mg of sl NTG was given. The 3D data set was reconstructed in 5% intervals of the 67-82 % of  the R-R cycle. Diastolic phases were analyzed on a dedicated work station using MPR, MIP and VRT modes. The patient received 80 cc of contrast. Aorta: Bicuspid Aortic valve with small raphe with fusion of the right and left coronary cusps. The commissural cusp distance is 36.73m and the short cusp is distance 26.873m Sinotubular junction diameter 27.65m58mAscending aorta diameter 50m90mortic arch diameter 21.4mm 39m descending aorta diameter23.3mm. 365mcalcifications. No dissection. Aortic Valve:  Trileaflet.  No calcifications. Coronary  Arteries:  Normal coronary origin.  Right dominance. RCA is a large dominant artery that gives rise to PDA and PLVB. There is no plaque. Left main is a large artery that gives rise to LAD and LCX arteries. LAD is a large vessel that gives rise to a large diagonal. There is no plaque. LCX is a non-dominant artery that gives rise to one large OM1 branch. There is no plaque. Other findings: Normal pulmonary vein drainage into the left atrium. Normal let atrial appendage without a thrombus. Normal size of the pulmonary artery. IMPRESSION: 1. Coronary calcium score of 0. This was 0 percentile for age and sex matched control. 2.  Normal coronary origin with right dominance. 3. No obvious evidence of plaque. Study limited by noise artifact. CAD RADs 0. 4. Bicuspid aortic valve with small raphe with fusion of right and left coronary cusps. 5. Normal aortic dimensions at the AV commissures, Sinotubular junction, Ascending aorta, aortic arch and descending aorta. 6.  Consider non atherosclerotic causes of chest pain. Fransico Him Electronically Signed   By: Fransico Him   On: 12/01/2020 16:03   Result Date: 12/01/2020 EXAM: OVER-READ INTERPRETATION  CT CHEST The following report is an over-read performed by radiologist Dr. Rolm Baptise of [facility], [place] on 12/01/2020. This over-read does not include interpretation of cardiac or coronary anatomy or pathology. The coronary CTA  interpretation by the cardiologist is attached. COMPARISON:  None. FINDINGS: Vascular: Heart is normal size.  Aorta normal caliber. Mediastinum/Nodes: No adenopathy Lungs/Pleura: No confluent opacities or effusions. Upper Abdomen: Low-density throughout the visualized liver compatible with fatty infiltration. Musculoskeletal: Chest wall soft tissues are unremarkable. No acute bony abnormality. IMPRESSION: Hepatic steatosis. No acute extra cardiac abnormality. Electronically Signed: By: Rolm Baptise M.D. On: 12/01/2020 13:07   DG Chest Port 1 View  Result Date: 11/30/2020 CLINICAL DATA:  Chest pain. EXAM: PORTABLE CHEST 1 VIEW COMPARISON:  September 02, 2020 FINDINGS: The heart size and mediastinal contours are within normal limits. Both lungs are clear. The visualized skeletal structures are unremarkable. IMPRESSION: No acute or active cardiopulmonary disease. Electronically Signed   By: Virgina Norfolk M.D.   On: 11/30/2020 19:43      Subjective: Patient seen and examined bedside, resting comfortably.  Coronary CT scan yesterday unremarkable.  Seen by cardiology this morning for recommendations of outpatient follow-up.  Denies any further chest pain.  No other complaints or concerns at this time.  Denies headache, no fever/chills/night sweats, no nausea/vomiting/diarrhea, no shortness of breath, no chest pain, no palpitations, no abdominal pain.  No acute events overnight per nursing staff.  Discharge Exam: Vitals:   12/02/20 0531 12/02/20 0729  BP: 119/64   Pulse:    Resp: 16   Temp: 98.4 F (36.9 C)   SpO2: 95% 95%   Vitals:   12/01/20 1947 12/01/20 2344 12/02/20 0531 12/02/20 0729  BP: 108/62 124/72 119/64   Pulse: (!) 57 60    Resp: _0 Temp: 98.5 F (36.9 C)  98.4 F (36.9 C)   TempSrc: Oral  Oral   SpO2: 97% 98% 95% 95%  Weight:      Height:        General: Pt is alert, awake, not in acute distress Cardiovascular: RRR, S1/S2 +, no rubs, no gallops Respiratory: CTA  bilaterally, no wheezing, no rhonchi Abdominal: Soft, NT, ND, bowel sounds + Extremities: no edema, no cyanosis    The results of significant diagnostics from this hospitalization (including imaging, microbiology,  ancillary and laboratory) are listed below for reference.     Microbiology: Recent Results (from the past 240 hour(s))  Resp Panel by RT-PCR (Flu A&B, Covid) Nasopharyngeal Swab     Status: None   Collection Time: 11/30/20 10:30 PM   Specimen: Nasopharyngeal Swab; Nasopharyngeal(NP) swabs in vial transport medium  Result Value Ref Range Status   SARS Coronavirus 2 by RT PCR NEGATIVE NEGATIVE Final    Comment: (NOTE) SARS-CoV-2 target nucleic acids are NOT DETECTED.  The SARS-CoV-2 RNA is generally detectable in upper respiratory specimens during the acute phase of infection. The lowest concentration of SARS-CoV-2 viral copies this assay can detect is 138 copies/mL. A negative result does not preclude SARS-Cov-2 infection and should not be used as the sole basis for treatment or other patient management decisions. A negative result may occur with  improper specimen collection/handling, submission of specimen other than nasopharyngeal swab, presence of viral mutation(s) within the areas targeted by this assay, and inadequate number of viral copies(<138 copies/mL). A negative result must be combined with clinical observations, patient history, and epidemiological information. The expected result is Negative.  Fact Sheet for Patients:  EntrepreneurPulse.com.au  Fact Sheet for Healthcare Providers:  IncredibleEmployment.be  This test is no t yet approved or cleared by the [place] FDA and  has been authorized for detection and/or diagnosis of SARS-CoV-2 by FDA under an Emergency Use Authorization (EUA). This EUA will remain  in effect (meaning this test can be used) for the duration of the COVID-19 declaration under Section  564(b)(1) of the Act, 21 U.S.C.section 360bbb-3(b)(1), unless the authorization is terminated  or revoked sooner.       Influenza A by PCR NEGATIVE NEGATIVE Final   Influenza B by PCR NEGATIVE NEGATIVE Final    Comment: (NOTE) The Xpert Xpress SARS-CoV-2/FLU/RSV plus assay is intended as an aid in the diagnosis of influenza from Nasopharyngeal swab specimens and should not be used as a sole basis for treatment. Nasal washings and aspirates are unacceptable for Xpert Xpress SARS-CoV-2/FLU/RSV testing.  Fact Sheet for Patients: EntrepreneurPulse.com.au  Fact Sheet for Healthcare Providers: IncredibleEmployment.be  This test is not yet approved or cleared by the [place] FDA and has been authorized for detection and/or diagnosis of SARS-CoV-2 by FDA under an Emergency Use Authorization (EUA). This EUA will remain in effect (meaning this test can be used) for the duration of the COVID-19 declaration under Section 564(b)(1) of the Act, 21 U.S.C. section 360bbb-3(b)(1), unless the authorization is terminated or revoked.  Performed at [facility], [address] [place], [place] [address]      Labs: BNP (last 3 results) Recent Labs    09/02/20 1236  BNP 19.1   Basic Metabolic Panel: Recent Labs  Lab 11/30/20 1900 12/01/20 0520  NA 137 137  K 3.5 3.6  CL 103 101  CO2 26 27  GLUCOSE 145* 142*  BUN 7 5*  CREATININE 0.77 0.88  CALCIUM 8.8* 8.9  MG  --  1.7  PHOS  --  3.2   Liver Function Tests: No results for input(s): AST, ALT, ALKPHOS, BILITOT, PROT, ALBUMIN in the last 168 hours. No results for input(s): LIPASE, AMYLASE in the last 168 hours. No results for input(s): AMMONIA in the last 168 hours. CBC: Recent Labs  Lab 11/30/20 1900 12/01/20 0520  WBC 13.6* 17.9*  NEUTROABS 8.5*  --   HGB 14.2 13.9  HCT 41.3 41.6  MCV 86.9 88.5  PLT 238 223  Cardiac Enzymes: No results for input(s): CKTOTAL, CKMB,  CKMBINDEX, TROPONINI in the last 168 hours. BNP: Invalid input(s): POCBNP CBG: Recent Labs  Lab 12/01/20 0739 12/01/20 1130 12/01/20 1755 12/01/20 2234 12/02/20 0757  GLUCAP 142* 164* 128* 111* 133*   D-Dimer No results for input(s): DDIMER in the last 72 hours. Hgb A1c Recent Labs    12/01/20 0259  HGBA1C 6.5*   Lipid Profile No results for input(s): CHOL, HDL, LDLCALC, TRIG, CHOLHDL, LDLDIRECT in the last 72 hours. Thyroid function studies No results for input(s): TSH, T4TOTAL, T3FREE, THYROIDAB in the last 72 hours.  Invalid input(s): FREET3 Anemia work up No results for input(s): VITAMINB12, FOLATE, FERRITIN, TIBC, IRON, RETICCTPCT in the last 72 hours. Urinalysis    Component Value Date/Time   COLORURINE YELLOW 06/30/2018 1717   APPEARANCEUR CLEAR 06/30/2018 1717   LABSPEC 1.015 06/30/2018 1717   PHURINE 6.5 06/30/2018 1717   GLUCOSEU NEGATIVE 06/30/2018 1717   GLUCOSEU NEGATIVE 02/15/2017 1131   HGBUR NEGATIVE 06/30/2018 1717   BILIRUBINUR NEGATIVE 06/30/2018 1717   BILIRUBINUR Negative 06/12/2018 1421   KETONESUR NEGATIVE 06/30/2018 1717   PROTEINUR NEGATIVE 06/30/2018 1717   UROBILINOGEN 0.2 06/12/2018 1421   UROBILINOGEN 0.2 02/15/2017 1131   NITRITE NEGATIVE 06/30/2018 1717   LEUKOCYTESUR NEGATIVE 06/30/2018 1717   Sepsis Labs Invalid input(s): PROCALCITONIN,  WBC,  LACTICIDVEN Microbiology Recent Results (from the past 240 hour(s))  Resp Panel by RT-PCR (Flu A&B, Covid) Nasopharyngeal Swab     Status: None   Collection Time: 11/30/20 10:30 PM   Specimen: Nasopharyngeal Swab; Nasopharyngeal(NP) swabs in vial transport medium  Result Value Ref Range Status   SARS Coronavirus 2 by RT PCR NEGATIVE NEGATIVE Final    Comment: (NOTE) SARS-CoV-2 target nucleic acids are NOT DETECTED.  The SARS-CoV-2 RNA is generally detectable in upper respiratory specimens during the acute phase of infection. The lowest concentration of SARS-CoV-2 viral copies this  assay can detect is 138 copies/mL. A negative result does not preclude SARS-Cov-2 infection and should not be used as the sole basis for treatment or other patient management decisions. A negative result may occur with  improper specimen collection/handling, submission of specimen other than nasopharyngeal swab, presence of viral mutation(s) within the areas targeted by this assay, and inadequate number of viral copies(<138 copies/mL). A negative result must be combined with clinical observations, patient history, and epidemiological information. The expected result is Negative.  Fact Sheet for Patients:  EntrepreneurPulse.com.au  Fact Sheet for Healthcare Providers:  IncredibleEmployment.be  This test is no t yet approved or cleared by the [place] FDA and  has been authorized for detection and/or diagnosis of SARS-CoV-2 by FDA under an Emergency Use Authorization (EUA). This EUA will remain  in effect (meaning this test can be used) for the duration of the COVID-19 declaration under Section 564(b)(1) of the Act, 21 U.S.C.section 360bbb-3(b)(1), unless the authorization is terminated  or revoked sooner.       Influenza A by PCR NEGATIVE NEGATIVE Final   Influenza B by PCR NEGATIVE NEGATIVE Final    Comment: (NOTE) The Xpert Xpress SARS-CoV-2/FLU/RSV plus assay is intended as an aid in the diagnosis of influenza from Nasopharyngeal swab specimens and should not be used as a sole basis for treatment. Nasal washings and aspirates are unacceptable for Xpert Xpress SARS-CoV-2/FLU/RSV testing.  Fact Sheet for Patients: EntrepreneurPulse.com.au  Fact Sheet for Healthcare Providers: IncredibleEmployment.be  This test is not yet approved or cleared by the [place] and has been authorized  for detection and/or diagnosis of SARS-CoV-2 by FDA under an Emergency Use Authorization (EUA). This EUA will  remain in effect (meaning this test can be used) for the duration of the COVID-19 declaration under Section 564(b)(1) of the Act, 21 U.S.C. section 360bbb-3(b)(1), unless the authorization is terminated or revoked.  Performed at [facility], [address] [place], [place] [address]      Time coordinating discharge: Over 30 minutes  SIGNED:   Slater Mcmanaman J [place] DO  [facility] 12/02/2020, 10:24 AM

## 2020-12-02 NOTE — Progress Notes (Signed)
Progress Note  Patient Name: Stephanie Miles Date of Encounter: 12/02/2020  [facility] Cardiologist: Jenean Lindau, MD   Subjective   No chest pain   Inpatient Medications    Scheduled Meds: . buPROPion  150 mg Oral Daily  . chlorhexidine  15 mL Mouth Rinse BID  . cholecalciferol  2,000 Units Oral Daily  . clotrimazole   Topical BID  . dicyclomine  20 mg Oral TID AC & HS  . DULoxetine  60 mg Oral Daily  . enoxaparin (LOVENOX) injection  40 mg Subcutaneous Daily  . furosemide  40 mg Oral q morning  . insulin aspart  0-5 Units Subcutaneous QHS  . insulin aspart  0-9 Units Subcutaneous TID WC  . lactulose  10 g Oral BID  . loratadine  10 mg Oral Daily  . losartan  100 mg Oral Daily  . mouth rinse  15 mL Mouth Rinse q12n4p  . metoprolol succinate  50 mg Oral Daily  . mometasone-formoterol  2 puff Inhalation BID  . montelukast  10 mg Oral QHS  . pantoprazole  40 mg Oral BID  . potassium chloride  10 mEq Oral QODAY  . prenatal multivitamin  1 tablet Oral Daily   Continuous Infusions:  PRN Meds: acetaminophen, albuterol, ALPRAZolam, nitroGLYCERIN, Ubrogepant   Vital Signs    Vitals:   12/01/20 1947 12/01/20 2344 12/02/20 0531 12/02/20 0729  BP: 108/62 124/72 119/64   Pulse: (!) 57 60    Resp: 18 16 16    Temp: 98.5 F (36.9 C)  98.4 F (36.9 C)   TempSrc: Oral  Oral   SpO2: 97% 98% 95% 95%  Weight:      Height:       No intake or output data in the 24 hours ending 12/02/20 0821 Last 3 Weights 12/01/2020 11/30/2020 10/23/2020  Weight (lbs) 285 lb 8 oz 281 lb 286 lb  Weight (kg) 129.502 kg 127.461 kg 129.729 kg      Telemetry    SR - Personally Reviewed  ECG    None today - Personally Reviewed  Physical Exam   GEN: No acute distress.   Neck: No JVD Cardiac: RRR, no murmurs, rubs, or gallops.  Respiratory: Clear to auscultation bilaterally. GI: Soft, nontender, non-distended  MS: No edema; No deformity. Neuro:  Nonfocal  Psych: Normal  affect   Labs    High Sensitivity Troponin:   Recent Labs  Lab 11/30/20 1900 11/30/20 2051  TROPONINIHS 2 2      Chemistry Recent Labs  Lab 11/30/20 1900 12/01/20 0520  NA 137 137  K 3.5 3.6  CL 103 101  CO2 26 27  GLUCOSE 145* 142*  BUN 7 5*  CREATININE 0.77 0.88  CALCIUM 8.8* 8.9  GFRNONAA >60 >60  ANIONGAP 8 9     Hematology Recent Labs  Lab 11/30/20 1900 12/01/20 0520  WBC 13.6* 17.9*  RBC 4.75 4.70  HGB 14.2 13.9  HCT 41.3 41.6  MCV 86.9 88.5  MCH 29.9 29.6  MCHC 34.4 33.4  RDW 13.1 13.2  PLT 238 223   Lab Results  Component Value Date   CHOL 207 (H) 09/23/2020   HDL 39.10 09/23/2020   LDLDIRECT 139.0 09/23/2020   TRIG 260.0 (H) 09/23/2020   CHOLHDL 5 09/23/2020   Lab Results  Component Value Date   TSH 1.51 01/06/2020   Lab Results  Component Value Date   HGBA1C 6.5 (H) 12/01/2020    BNPNo results for input(s): BNP,  PROBNP in the last 168 hours.   DDimer No results for input(s): DDIMER in the last 168 hours.   Radiology    CT CORONARY MORPH W/CTA COR W/SCORE W/CA W/CM &/OR WO/CM  Addendum Date: 12/01/2020   ADDENDUM REPORT: 12/01/2020 16:03 EXAM: Cardiac/Coronary  CT TECHNIQUE: The patient was scanned on a [employer]. FINDINGS: A 120 kV prospective scan was triggered in the descending thoracic aorta at 111 HU's. Axial non-contrast 3 mm slices were carried out through the heart. The data set was analyzed on a dedicated work station and scored using the [employer]. Gantry rotation speed was 250 msecs and collimation was .6 mm. No beta blockade and 0.8 mg of sl NTG was given. The 3D data set was reconstructed in 5% intervals of the 67-82 % of the R-R cycle. Diastolic phases were analyzed on a dedicated work station using MPR, MIP and VRT modes. The patient received 80 cc of contrast. Aorta: Bicuspid Aortic valve with small raphe with fusion of the right and left coronary cusps. The commissural cusp distance is 36.1m and the  short cusp is distance 26.874m Sinotubular junction diameter 27.42m31mAscending aorta diameter 31m142mortic arch diameter 21.4mm 62m descending aorta diameter23.3mm. 59mcalcifications. No dissection. Aortic Valve:  Trileaflet.  No calcifications. Coronary Arteries:  Normal coronary origin.  Right dominance. RCA is a large dominant artery that gives rise to PDA and PLVB. There is no plaque. Left main is a large artery that gives rise to LAD and LCX arteries. LAD is a large vessel that gives rise to a large diagonal. There is no plaque. LCX is a non-dominant artery that gives rise to one large OM1 branch. There is no plaque. Other findings: Normal pulmonary vein drainage into the left atrium. Normal let atrial appendage without a thrombus. Normal size of the pulmonary artery. IMPRESSION: 1. Coronary calcium score of 0. This was 0 percentile for age and sex matched control. 2.  Normal coronary origin with right dominance. 3. No obvious evidence of plaque. Study limited by noise artifact. CAD RADs 0. 4. Bicuspid aortic valve with small raphe with fusion of right and left coronary cusps. 5. Normal aortic dimensions at the AV commissures, Sinotubular junction, Ascending aorta, aortic arch and descending aorta. 6.  Consider non atherosclerotic causes of chest pain. Traci Fransico Himronically Signed   By: Traci Fransico Him 12/01/2020 16:03   Result Date: 12/01/2020 EXAM: OVER-READ INTERPRETATION  CT CHEST The following report is an over-read performed by radiologist Dr. Kevin Rolm [facility] [place] on [place]. This over-read does not include interpretation of cardiac or coronary anatomy or pathology. The coronary CTA interpretation by the cardiologist is attached. COMPARISON:  None. FINDINGS: Vascular: Heart is normal size.  Aorta normal caliber. Mediastinum/Nodes: No adenopathy Lungs/Pleura: No confluent opacities or effusions. Upper Abdomen: Low-density throughout the visualized liver compatible with  fatty infiltration. Musculoskeletal: Chest wall soft tissues are unremarkable. No acute bony abnormality. IMPRESSION: Hepatic steatosis. No acute extra cardiac abnormality. Electronically Signed: By: Kevin Rolm BaptiseOn: 12/01/2020 13:07   DG Chest Port 1 View  Result Date: 11/30/2020 CLINICAL DATA:  Chest pain. EXAM: PORTABLE CHEST 1 VIEW COMPARISON:  September 02, 2020 FINDINGS: The heart size and mediastinal contours are within normal limits. Both lungs are clear. The visualized skeletal structures are unremarkable. IMPRESSION: No acute or active cardiopulmonary disease. Electronically Signed   By: ThaddeVirgina Norfolk  On: 11/30/2020 19:43    Cardiac Studies  CARDIAC CT ordered  ECHO: 06/26/2020  1. Left ventricular ejection fraction, by estimation, is 60 to 65%. The  left ventricle has normal function. The left ventricle has no regional  wall motion abnormalities. There is mild concentric left ventricular  hypertrophy. Left ventricular diastolic  parameters are consistent with Grade II diastolic dysfunction  (pseudonormalization).   2. There is the interventricular septum is flattened in systole,  consistent with right ventricular pressure overload.   3. Right ventricular systolic function is low normal. The right  ventricular size is mildly enlarged.   4. The mitral valve is normal in structure. No evidence of mitral valve  regurgitation. No evidence of mitral stenosis.   5. The aortic valve was not well visualized. Aortic valve regurgitation  is mild. No aortic stenosis is present.   6. Unable to determine Pulmonary artery systolic pressure, No TR jet  spectral display.  ECHO: 11/28/2017  - Left ventricle: The cavity size was normal. Systolic function was    normal. The estimated ejection fraction was in the range of 55%    to 60%. Wall motion was normal; there were no regional wall    motion abnormalities.  - Aortic valve: Bicuspid; normal thickness leaflets. There was  mild    regurgitation. Valve area (VTI): 2.02 cm^2. Valve area (Vmax):    2.07 cm^2. Valve area (Vmean): 1.99 cm^2.   Impressions:   - Normal LVEF and size,    Bicuspid aortic valve    Mild AR.   Patient Profile     40 y.o. female with a history of bicuspid AoV, T2DM, HTN, HLD, GERD, NASH, morbid obesity, who presented 05/09 with chest pain   Assessment & Plan    1. Chest pain - CT 12/01/20 with no CAD, calcium score 0 Aortic root normal size 3.4 cm  - non cardiac chest pain   2. HLD - LDL 139 - Not on statin pta - MD advise on starting one now or wait on CT results to determine goal LDL  3. HTN - BP well-controlled on current rx, but feel she will tolerate one-time dose Lopressor  4. Bicuspid AV - mild AR no AS by TTE 06/26/20    Ok to d/c home will arrange f/u with Dr Julianne Rice      For questions or updates, please contact [facility] Please consult [email or web address] for contact info under        Signed, Jenkins Rouge, MD  12/02/2020, 8:21 AM

## 2020-12-02 NOTE — Discharge Instructions (Addendum)
Chest Wall Pain Chest wall pain is pain in or around the bones and muscles of your chest. Chest wall pain may be caused by:  An injury.  Coughing a lot.  Using your chest and arm muscles too much. Sometimes, the cause may not be known. This pain may take a few weeks or longer to get better. Follow these instructions at home: Managing pain, stiffness, and swelling If told, put ice on the painful area:  Put ice in a plastic bag.  Place a towel between your skin and the bag.  Leave the ice on for 20 minutes, 2-3 times a day.   Activity  Rest as told by your doctor.  Avoid doing things that cause pain. This includes lifting heavy items.  Ask your doctor what activities are safe for you. General instructions  Take over-the-counter and prescription medicines only as told by your doctor.  Do not use any products that contain nicotine or tobacco, such as cigarettes, e-cigarettes, and chewing tobacco. If you need help quitting, ask your doctor.  Keep all follow-up visits as told by your doctor. This is important.   Contact a doctor if:  You have a fever.  Your chest pain gets worse.  You have new symptoms. Get help right away if:  You feel sick to your stomach (nauseous) or you throw up (vomit).  You feel sweaty or light-headed.  You have a cough with mucus from your lungs (sputum) or you cough up blood.  You are short of breath. These symptoms may be an emergency. Do not wait to see if the symptoms will go away. Get medical help right away. Call your local emergency services (911 in the [place] Do not drive yourself to the hospital. Summary  Chest wall pain is pain in or around the bones and muscles of your chest.  It may be treated with ice, rest, and medicines. Your condition may also get better if you avoid doing things that cause pain.  Contact a doctor if you have a fever, chest pain that gets worse, or new symptoms.  Get help right away if you feel light-headed  or you get short of breath. These symptoms may be an emergency. This information is not intended to replace advice given to you by your health care provider. Make sure you discuss any questions you have with your health care provider. Document Revised: 01/11/2018 Document Reviewed: 01/11/2018 Elsevier Patient Education  2021 [employer].

## 2020-12-02 NOTE — Progress Notes (Signed)
Pt is alert and oriented. Discharge instructions/ AVS given to pt.

## 2020-12-03 ENCOUNTER — Telehealth: Payer: Self-pay

## 2020-12-03 NOTE — Telephone Encounter (Signed)
Transition Care Management Unsuccessful Follow-up Telephone Call  Date of discharge and from where:  12/02/20-[place]  Attempts:  2nd Attempt  Reason for unsuccessful TCM follow-up call:  No answer/busy

## 2020-12-03 NOTE — Telephone Encounter (Signed)
Transition Care Management Follow-up Telephone Call  Date of discharge and from where: 12/03/2018-[facility]  How have you been since you were released from the hospital? Doing ok..just tired  Any questions or concerns? Yes  Items Reviewed:  Did the pt receive and understand the discharge instructions provided? Yes   Medications obtained and verified? Yes   Other? Yes   Any new allergies since your discharge? No   Dietary orders reviewed? Yes  Do you have support at home? No but has neighbors she can call  Home Care and Equipment/Supplies: Were home health services ordered? no If so, what is the name of the agency? n/a  Has the agency set up a time to come to the patient's home? not applicable Were any new equipment or medical supplies ordered?  No What is the name of the medical supply agency? n/a Were you able to get the supplies/equipment? not applicable Do you have any questions related to the use of the equipment or supplies? n/a  Functional Questionnaire: (I = Independent and D = Dependent) ADLs: I  Bathing/Dressing- I  Meal Prep- I  Eating- I  Maintaining continence- I  Transferring/Ambulation- I  Managing Meds- I  Follow up appointments reviewed:   PCP Hospital f/u appt confirmed? Yes  Scheduled to see Debbrah Alar on 12/08/20 @ 11:00.  [facility] f/u appt confirmed? Yes  Scheduled to see Dr. Salley Scarlet on 12/23/20 @ 1:20.  Are transportation arrangements needed? No   If their condition worsens, is the pt aware to call PCP or go to the Emergency Dept.? Yes  Was the patient provided with contact information for the PCP's office or ED? Yes  Was to pt encouraged to call back with questions or concerns? Yes

## 2020-12-03 NOTE — Telephone Encounter (Signed)
Patient is requesting a refill of Lactulose.

## 2020-12-03 NOTE — Telephone Encounter (Signed)
Transition Care Management Unsuccessful Follow-up Telephone Call  Date of discharge and from where:  12/02/20-[place]  Attempts:  1st Attempt  Reason for unsuccessful TCM follow-up call:  Left voice message

## 2020-12-04 MED ORDER — LACTULOSE 20 GM/30ML PO SOLN: ORAL | 5 refills | Status: AC

## 2020-12-04 NOTE — Addendum Note (Signed)
Addended by: Debbrah Alar on: 12/04/2020 07:41 AM   Modules accepted: Orders

## 2020-12-08 ENCOUNTER — Ambulatory Visit (INDEPENDENT_AMBULATORY_CARE_PROVIDER_SITE_OTHER): Payer: Managed Care, Other (non HMO) | Admitting: Family

## 2020-12-08 ENCOUNTER — Other Ambulatory Visit: Payer: Self-pay

## 2020-12-08 ENCOUNTER — Telehealth: Payer: Self-pay | Admitting: Family

## 2020-12-08 VITALS — BP 114/63 | HR 73 | Temp 98.8°F | Resp 16 | Ht 66.0 in | Wt 287.0 lb

## 2020-12-08 DIAGNOSIS — E119 Type 2 diabetes mellitus without complications: Secondary | ICD-10-CM

## 2020-12-08 DIAGNOSIS — D72829 Elevated white blood cell count, unspecified: Secondary | ICD-10-CM | POA: Diagnosis not present

## 2020-12-08 DIAGNOSIS — E785 Hyperlipidemia, unspecified: Secondary | ICD-10-CM

## 2020-12-08 DIAGNOSIS — R0789 Other chest pain: Secondary | ICD-10-CM

## 2020-12-08 HISTORY — DX: Other chest pain: R07.89

## 2020-12-08 LAB — CBC WITH DIFFERENTIAL/PLATELET
Basophils Absolute: 0.1 10*3/uL (ref 0.0–0.1)
Basophils Relative: 0.8 % (ref 0.0–3.0)
Eosinophils Absolute: 0.2 10*3/uL (ref 0.0–0.7)
Eosinophils Relative: 1.8 % (ref 0.0–5.0)
HCT: 39.8 % (ref 36.0–46.0)
Hemoglobin: 13.5 g/dL (ref 12.0–15.0)
Lymphocytes Relative: 32.3 % (ref 12.0–46.0)
Lymphs Abs: 3.2 10*3/uL (ref 0.7–4.0)
MCHC: 34 g/dL (ref 30.0–36.0)
MCV: 88.3 fl (ref 78.0–100.0)
Monocytes Absolute: 0.6 10*3/uL (ref 0.1–1.0)
Monocytes Relative: 6.4 % (ref 3.0–12.0)
Neutro Abs: 5.8 10*3/uL (ref 1.4–7.7)
Neutrophils Relative %: 58.7 % (ref 43.0–77.0)
Platelets: 198 10*3/uL (ref 150.0–400.0)
RBC: 4.51 Mil/uL (ref 3.87–5.11)
RDW: 14.3 % (ref 11.5–15.5)
WBC: 9.9 10*3/uL (ref 4.0–10.5)

## 2020-12-08 MED ORDER — OZEMPIC (0.25 OR 0.5 MG/DOSE) 2 MG/1.5ML ~~LOC~~ SOPN: 0.2500 mg | PEN_INJECTOR | SUBCUTANEOUS | 0 refills | Status: DC

## 2020-12-08 NOTE — Assessment & Plan Note (Signed)
She is interested in weight loss. Mancel Parsons is not covered by her insurance but Ozempic is covered. She is interested in the associated weight loss benefits and glycemic benefits of ozempic. Will initiate at 0.64m SQ weekly.

## 2020-12-08 NOTE — Assessment & Plan Note (Signed)
Noted to have WBC 17.9 while in the hospital. She denied any steroid use prior to this reading. Will repeat CBC today.

## 2020-12-08 NOTE — Assessment & Plan Note (Signed)
Resolved. Unremarkable work up in the hospital. We discussed possibility of a statin for CV risk reduction. Will check with her hepatologist and if they approve her to be on a statin, then she is willing to begin.

## 2020-12-08 NOTE — Patient Instructions (Signed)
Please complete lab work prior to leaving. Start Ozempic 0.51m SQ weekly x 4 weeks, then call me for the next dose.  Good luck with your move!

## 2020-12-08 NOTE — Telephone Encounter (Signed)
Left message for Elio Forget PA-C with [facility] requesting call back letting me know if they are OK with pt starting a statin medication.

## 2020-12-08 NOTE — Progress Notes (Signed)
Established Patient Office Visit  Subjective:  Patient ID: Stephanie Miles, female    DOB: Feb 15, 1981  Age: 40 y.o. MRN: [id number]  CC:  Chief Complaint  Patient presents with  . Follow-up    Here for hospital follow up    HPI Stephanie Miles presents for hospital follow up. She presented to the ED on 5/9 with substernal chest pain. She was admitted for further cardiac evaluation/consultation. Inpatient work up included TTE with LVEF 55-80%, no regional wall motion abnormalities.  Patient underwent CT coronary with calcium score equal to 0.  Past Medical History:  Diagnosis Date  . Allergy   . ANA positive 12/28/2016  . Anxiety   . Anxiety state 10/16/2013  . Aortic regurgitation due to bicuspid aortic valve   . Arthritis   . Asthma    allergy induced asthma   . Black stools 09/13/2017  . Borderline diabetes 10/20/2016  . Chronic kidney disease    kidney stones  . Cough variant asthma vs uacs  07/07/2015   Spirometry 07/06/2015   Purely restrictive so rec trial off qvar and rx dulera 100 2bid for flare off qvar > no flares on gerd rx as of 08/11/2015      . Depression   . Diabetes type 2, controlled ([place]) 10/20/2016  . Diarrhea 06/20/2017  . Dyspnea 07/07/2015   Spirometry 07/06/2015 purely restrictive   . Elevated LFTs 01/11/2017  . Essential hypertension 06/06/2013   Formatting of this note might be different from the original. Last Assessment & Plan:  Stable off of meds, monitor.  . Eustachian tube dysfunction, bilateral 10/10/2018   Last Assessment & Plan:  Concern over her ears. Chronic history of gradual hearing loss.  No significant history of ear infections, surgery or trauma.  Her ears pop frequently and feel full.  There is some associated bilateral ringing.  She wants to understand is there anything that can be done to improve this.  Recent audiogram is reviewed and shows a  mild to severe mixed hearing loss. EXAM show  . Family history of breast cancer  in female   . Family history of colon cancer   . Fatty liver 11/28/2016  . Fibromyalgia 04/04/2019  . Genetic testing 02/28/2017   Negative genetic testing on the common hereditary cancer panel.  The Hereditary Gene Panel offered by [facility] includes sequencing and/or deletion duplication testing of the following 46 genes: APC, ATM, AXIN2, BARD1, BMPR1A, BRCA1, BRCA2, BRIP1, CDH1, CDKN2A (p14ARF), CDKN2A (p16INK4a), CHEK2, CTNNA1, DICER1, EPCAM (Deletion/duplication testing only), GREM1 (promoter region deletion/duplication te  . GERD (gastroesophageal reflux disease)   . Heart murmur    bicuspid AV per pt   . Hepatocellular carcinoma ([place]) 01/31/2018  . History of chemotherapy    TACE for liver cancer   . History of kidney stones 10/21/2016  . Hyperlipidemia   . Hypertension   . Hypertriglyceridemia 04/05/2018  . IBS (irritable bowel syndrome)   . Insomnia   . Iron deficiency anemia due to chronic blood loss 03/21/2017  . Left leg injury, initial encounter 02/01/2018  . Leukocytosis 12/06/2016  . Liver cancer ([place]) 03/2017  . Liver lesion 06/20/2017  . Migraine 07/08/2013  . Migraine headache   . Migraine, unspecified, not intractable, without status migrainosus 07/08/2013   Formatting of this note might be different from the original. Last Assessment & Plan:  Stable, continue prn imitrex/zofran.  . 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 NASH (nonalcoholic steatohepatitis) 04/04/2017  Per biopsy 9/18  . Neuromuscular disorder ([place])    very mild neuropathy hands  feet   . OCD (obsessive compulsive disorder)   . 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 OSA (obstructive sleep apnea)   . Osteoarthritis 10/21/2016  . Personal history of tobacco use, presenting hazards to health 01/20/2014  . Primary osteoarthritis of both knees 12/28/2016  . Right shoulder injury, subsequent encounter 02/01/2018  . RUQ abdominal pain 01/11/2017   . S/P appy 05/2013  . Severe obesity (BMI >= 40) ([place]) 07/07/2015   Formatting of this note might be different from the original. Last Assessment & Plan:  Formatting of this note might be different from the original. Body mass index is 40.21 kg/(m^2).  Lab Results  Component Value Date   TSH 0.562 07/09/2013   Contributing to gerd tendency/ doe/reviewed the need and the process to achieve and maintain neg calorie balance > defer f/u primary care including [facility]  . Sleep apnea    wears cpap   . Tinnitus of both ears 10/11/2018  . Type 2 diabetes mellitus without complication, without long-term current use of insulin ([place]) 10/20/2016   Formatting of this note might be different from the original. Last Assessment & Plan:  Stable, continue diabetic diet.  . Vitamin D deficiency 11/01/2017    Past Surgical History:  Procedure Laterality Date  . APPENDECTOMY    . COLONOSCOPY    . DENTAL SURGERY     wisdom teeth  . ESOPHAGOGASTRODUODENOSCOPY    . LAPAROSCOPIC APPENDECTOMY N/A 06/06/2013   Procedure: APPENDECTOMY LAPAROSCOPIC;  Surgeon: Imogene Burn. Georgette Dover, MD;  Location: [facility];  Service: General;  Laterality: N/A;  . LIVER BIOPSY    . UPPER GASTROINTESTINAL ENDOSCOPY      Family History  Problem Relation Age of Onset  . Asthma Mother   . Hypertension Mother   . Breast cancer Father 1  . Diabetes Father   . Hypertension Father   . Heart disease Father   . Hyperlipidemia Father   . Asthma Sister   . Hyperlipidemia Brother   . Hypertension Brother   . Vision loss Brother   . Colon cancer Maternal Grandmother 74  . Liver cancer Maternal Grandmother   . Cervical cancer Maternal Aunt   . Stroke Paternal Uncle 12  . Melanoma Maternal Grandfather 64  . Colon polyps Maternal Grandfather   . AAA (abdominal aortic aneurysm) Paternal Grandmother   . Heart disease Paternal Grandfather   . Heart disease Paternal Uncle 89  . Colon cancer Other 78       MGM's mother  . Stomach cancer Other  58       MGM's mother  . Breast cancer Other        MGM's maternal aunt  . Cancer Other        MGM's maternal uncle with GI cancer  . Esophageal cancer Neg Hx   . Pancreatic cancer Neg Hx   . Rectal cancer Neg Hx     Social History   Socioeconomic History  . Marital status: Single    Spouse name: Not on file  . Number of children: 0  . Years of education: Not on file  . Highest education level: Not on file  Occupational History  . Occupation: Ship broker  . Occupation: Programmer, applications  Tobacco Use  . Smoking status: Former Smoker    Packs/day: 1.00    Years: 20.00  Pack years: 20.00    Types: Cigarettes    Quit date: 02/22/2013    Years since quitting: 7.7  . Smokeless tobacco: Never Used  Vaping Use  . Vaping Use: Never used  Substance and Sexual Activity  . Alcohol use: Yes    Comment: occ  . Drug use: No  . Sexual activity: Not on file  Other Topics Concern  . Not on file  Social History Narrative   Working at [employer] doing [employer] alone (has a Neurosurgeon)   Working on a degree at [employer], will plan to return fall 2018   Enjoys spending time with family.   Friends and family in [place] Determinants of Health   Financial Resource Strain: Not on file  Food Insecurity: Not on file  Transportation Needs: Not on file  Physical Activity: Not on file  Stress: Not on file  Social Connections: Not on file  Intimate Partner Violence: Not on file    Outpatient Medications Prior to Visit  Medication Sig Dispense Refill  . Accu-Chek FastClix Lancets MISC Check sugar twice daily 102 each 3  . acetaminophen (TYLENOL) 500 MG tablet Take 500 mg by mouth every 6 (six) hours as needed for mild pain, fever or headache.    Marland Kitchen AIMOVIG 70 MG/ML SOAJ INJECT 70MG INTO THE SKIN ONCE EVERY 30 DAYS (Patient taking differently: Inject 70 mg into the skin every 30 (thirty) days.) 1 mL 9  . albuterol (VENTOLIN HFA) 108 (90 Base) MCG/ACT inhaler Inhale 2 puffs into  the lungs every 6 (six) hours as needed for wheezing or shortness of breath.    . ALPRAZolam (XANAX) 0.5 MG tablet TAKE ONE TABLET BY MOUTH TWICE A DAY AS NEEDED (Patient taking differently: Take 0.5 mg by mouth 2 (two) times daily as needed for anxiety.) 45 tablet 0  . Blood Glucose Monitoring Suppl (ACCU-CHEK GUIDE) w/Device KIT 1 each by Does not apply route daily. 1 kit 0  . budesonide-formoterol (SYMBICORT) 160-4.5 MCG/ACT inhaler INHALE TWO PUFFS BY MOUTH TWICE A DAY (Patient taking differently: Inhale 2 puffs into the lungs 2 (two) times daily.) 10.2 g 5  . buPROPion (WELLBUTRIN XL) 150 MG 24 hr tablet TAKE ONE TABLET BY MOUTH DAILY (Patient taking differently: Take 150 mg by mouth daily.) 90 tablet 1  . cetirizine (ZYRTEC) 10 MG tablet Take 10 mg by mouth daily.    . Cholecalciferol (VITAMIN D3) 50 MCG (2000 UT) TABS Take 2,000 Units by mouth daily.    . clotrimazole-betamethasone (LOTRISONE) cream Apply 1 application topically 2 (two) times daily. (Patient taking differently: Apply 1 application topically 2 (two) times daily as needed (skin fold rash).) 45 g 1  . dicyclomine (BENTYL) 20 MG tablet TAKE ONE TABLET BY MOUTH FOUR TIMES A DAY BEFORE MEALS AND AT BEDTIME AS DIRECTED (Patient taking differently: Take 20 mg by mouth 4 (four) times daily as needed for spasms.) 80 tablet 2  . DULoxetine (CYMBALTA) 60 MG capsule TAKE ONE CAPSULE BY MOUTH DAILY (Patient taking differently: Take 60 mg by mouth daily.) 90 capsule 1  . fluticasone (FLONASE) 50 MCG/ACT nasal spray Place 2 sprays into both nostrils daily. 16 g 1  . furosemide (LASIX) 40 MG tablet TAKE ONE TABLET BY MOUTH EVERY MORNING (Patient taking differently: Take 40 mg by mouth daily.) 90 tablet 1  . glucose blood (ACCU-CHEK GUIDE) test strip USE TO CHECK BLOOD SUGAR TWO TIMES A DAY 100 each 3  . Lactulose  20 GM/30ML SOLN 15 ML by mouth twice daily.  May titrate as needed to 45m three times daily with goal of 2-3 soft BM's/day 1892 mL 5   . losartan (COZAAR) 100 MG tablet TAKE ONE TABLET BY MOUTH DAILY (Patient taking differently: Take 100 mg by mouth daily.) 90 tablet 1  . metFORMIN (GLUCOPHAGE-XR) 500 MG 24 hr tablet TAKE TWO TABLETS BY MOUTH DAILY WITH BREAKFAST (Patient taking differently: Take 1,000 mg by mouth daily with breakfast.) 180 tablet 1  . metoprolol succinate (TOPROL-XL) 100 MG 24 hr tablet TAKE ONE TABLET BY MOUTH DAILY OR IMMEDIATELY FOLLOWING A MEAL AS DIRECTED (Patient taking differently: Take 100 mg by mouth daily.) 90 tablet 1  . montelukast (SINGULAIR) 10 MG tablet TAKE ONE TABLET BY MOUTH EVERY NIGHT AT BEDTIME (Patient taking differently: Take 10 mg by mouth at bedtime.) 30 tablet 5  . naproxen sodium (ALEVE) 220 MG tablet Take 440 mg by mouth 2 (two) times daily as needed (headache/pain).    . nystatin (NYSTATIN) powder Apply topically 2 (two) times daily. (Patient taking differently: Apply 1 application topically 3 (three) times daily as needed (skin fold rashes).) 30 g 2  . omeprazole (PRILOSEC) 20 MG capsule TAKE TWO CAPSULES BY MOUTH EVERY MORNING BEFORE BREAKFAST AND TAKE ONE CAPSULE BY MOUTH EVERY EVENING BEFORE SUPPER (Patient taking differently: Take 20-40 mg by mouth See admin instructions. TAKE 490mEVERY MORNING BEFORE BREAKFAST AND TAKE 2053mY MOUTH EVERY EVENING BEFORE SUPPER) 270 capsule 3  . ondansetron (ZOFRAN-ODT) 4 MG disintegrating tablet DISSOLVE ONE TABLET BY MOUTH EVERY 8 HOURS AS NEEDED FOR NAUSEA OR VOMITING (Patient taking differently: Take 4 mg by mouth every 8 (eight) hours as needed for nausea or vomiting.) 30 tablet 0  . potassium chloride (KLOR-CON) 10 MEQ tablet TAKE ONE TABLET BY MOUTH EVERY OTHER DAY (Patient taking differently: Take 10 mEq by mouth every other day.) 45 tablet 1  . Prenatal Vit-Fe Fumarate-FA (MULTIVITAMIN-PRENATAL) 27-0.8 MG TABS tablet Take 1 tablet by mouth daily.     . UMarland KitchenRELVY 100 MG TABS TAKE ONE TABLET BY MOUTH AS NEEDED. MAY REPEAT IN TWO HOURS. MAXIMUM OF  TWO TABLETS IN A 24 HOUR PERIOD. (Patient taking differently: Take 100 mg by mouth 2 (two) times daily as needed (migraines).) 10 tablet 11   No facility-administered medications prior to visit.    Allergies  Allergen Reactions  . Dust Mite Extract Shortness Of Breath and Cough  . Mold Extract [Trichophyton] Shortness Of Breath and Cough  . Nsaids Other (See Comments)    Patient is to NOT have NSAIDS  . Codeine Nausea Only    Required use of an antiemetic   . Amlodipine Swelling and Other (See Comments)    Edema   . Tape Rash    Medical tape and Band-Aids aren't tolerated    ROS Review of Systems    Objective:    Physical Exam Constitutional:      Appearance: Normal appearance. She is well-developed.  HENT:     Head: Normocephalic and atraumatic.  Cardiovascular:     Rate and Rhythm: Normal rate and regular rhythm.     Heart sounds: Normal heart sounds. No murmur heard.   Pulmonary:     Effort: Pulmonary effort is normal. No respiratory distress.     Breath sounds: Normal breath sounds. No wheezing.  Musculoskeletal:     Right lower leg: No edema.     Left lower leg: No edema.  Skin:    General:  Skin is warm and dry.  Neurological:     Mental Status: She is alert.  Psychiatric:        Behavior: Behavior normal.        Thought Content: Thought content normal.        Judgment: Judgment normal.     BP 114/63 (BP Location: Left Arm, Patient Position: Sitting, Cuff Size: Large)   Pulse 73   Temp 98.8 F (37.1 C) (Oral)   Resp 16   Ht _0  (1.676 m)   Wt 287 lb (130.2 kg)   LMP 11/15/2020   SpO2 99%   BMI 46.32 kg/m  Wt Readings from Last 3 Encounters:  12/08/20 287 lb (130.2 kg)  12/01/20 285 lb 8 oz (129.5 kg)  10/23/20 286 lb (129.7 kg)     Health Maintenance Due  Topic Date Due  . HIV Screening  Never done  . OPHTHALMOLOGY EXAM  07/01/2020  . COVID-19 Vaccine (4 - Booster for Pfizer series) 08/30/2020    There are no preventive care  reminders to display for this patient.  Lab Results  Component Value Date   TSH 1.51 01/06/2020   Lab Results  Component Value Date   WBC 17.9 (H) 12/01/2020   HGB 13.9 12/01/2020   HCT 41.6 12/01/2020   MCV 88.5 12/01/2020   PLT 223 12/01/2020   Lab Results  Component Value Date   NA 137 12/01/2020   K 3.6 12/01/2020   CO2 27 12/01/2020   GLUCOSE 142 (H) 12/01/2020   BUN 5 (L) 12/01/2020   CREATININE 0.88 12/01/2020   BILITOT 0.5 09/02/2020   ALKPHOS 94 09/02/2020   AST 42 (H) 09/02/2020   ALT 56 (H) 09/02/2020   PROT 7.5 09/02/2020   ALBUMIN 4.1 09/02/2020   CALCIUM 8.9 12/01/2020   ANIONGAP 9 12/01/2020   GFR 70.79 01/06/2020   Lab Results  Component Value Date   CHOL 207 (H) 09/23/2020   Lab Results  Component Value Date   HDL 39.10 09/23/2020   No results found for: [facility] Lab Results  Component Value Date   TRIG 260.0 (H) 09/23/2020   Lab Results  Component Value Date   CHOLHDL 5 09/23/2020   Lab Results  Component Value Date   HGBA1C 6.5 (H) 12/01/2020      Assessment & Plan:   Problem List Items Addressed This Visit   None     No orders of the defined types were placed in this encounter.   Follow-up: No follow-ups on file.    Nance Pear, NP

## 2020-12-09 ENCOUNTER — Telehealth: Payer: Self-pay

## 2020-12-09 NOTE — Telephone Encounter (Signed)
PA for Ozempic started via Cover My Meds  Janie Capp (Key: [id number]) Rx #: [phone] Ozempic (0.25 or 0.5 MG/DOSE) 2MG/1.5ML pen-injectors

## 2020-12-09 NOTE — Telephone Encounter (Signed)
HSVEXO:[id number]  Status: Approved  Review Type:Prior Auth;Coverage Start Date:12/09/2020;Coverage End Date:12/09/2021

## 2020-12-10 ENCOUNTER — Other Ambulatory Visit: Payer: Self-pay | Admitting: Family

## 2020-12-10 DIAGNOSIS — R059 Cough, unspecified: Secondary | ICD-10-CM

## 2020-12-10 DIAGNOSIS — R0600 Dyspnea, unspecified: Secondary | ICD-10-CM

## 2020-12-10 MED ORDER — ATORVASTATIN CALCIUM 10 MG PO TABS: 10.0000 mg | ORAL_TABLET | Freq: Every day | ORAL | 0 refills | Status: AC

## 2020-12-10 NOTE — Telephone Encounter (Signed)
See mychart.  

## 2020-12-10 NOTE — Telephone Encounter (Signed)
Requesting: alprazolam 0.26m Contract: 04/27/2018 UDS: 04/27/2018 Last Visit: 12/08/2020 Next Visit: None Last Refill: 11/13/2020 #45 and 0RF  Please Advise

## 2020-12-22 DIAGNOSIS — Q231 Congenital insufficiency of aortic valve: Secondary | ICD-10-CM | POA: Insufficient documentation

## 2020-12-23 ENCOUNTER — Other Ambulatory Visit: Payer: Self-pay

## 2020-12-23 ENCOUNTER — Ambulatory Visit (INDEPENDENT_AMBULATORY_CARE_PROVIDER_SITE_OTHER): Payer: Managed Care, Other (non HMO) | Admitting: Cardiology

## 2020-12-23 ENCOUNTER — Encounter: Payer: Self-pay | Admitting: Cardiology

## 2020-12-23 VITALS — BP 120/80 | HR 68 | Ht 66.0 in | Wt 286.0 lb

## 2020-12-23 DIAGNOSIS — Q231 Congenital insufficiency of aortic valve: Secondary | ICD-10-CM

## 2020-12-23 DIAGNOSIS — E1121 Type 2 diabetes mellitus with diabetic nephropathy: Secondary | ICD-10-CM

## 2020-12-23 DIAGNOSIS — I10 Essential (primary) hypertension: Secondary | ICD-10-CM | POA: Diagnosis not present

## 2020-12-23 MED ORDER — NITROGLYCERIN 0.4 MG SL SUBL: 0.4000 mg | SUBLINGUAL_TABLET | SUBLINGUAL | 6 refills | Status: AC | PRN

## 2020-12-23 NOTE — Progress Notes (Signed)
Cardiology Office Note:    Date:  12/23/2020   ID:  Stephanie Miles, DOB Mar 27, 1981, MRN [id number]  PCP:  Debbrah Alar, NP  Cardiologist:  Jenean Lindau, MD   Referring MD: Debbrah Alar, NP    ASSESSMENT:    1. Aortic regurgitation due to bicuspid aortic valve   2. Bicuspid aortic valve   3. Essential hypertension   4. Controlled type 2 diabetes mellitus with diabetic nephropathy, without long-term current use of insulin ([place])   5. Severe obesity (BMI >= 40) (HCC)    PLAN:    In order of problems listed above:  1. Primary prevention stressed with the patient.  Importance of compliance with diet medication stressed and she vocalized understanding.  She was advised to walk at least half an hour a day 5 days a week and she promises to do so. Aortic regurgitation with bicuspid aortic valve: Stable at this time.  We will continue to monitor.  Questions were answered to her satisfaction. Essential hypertension: Blood pressure stable and diet was emphasized.  Lifestyle modification urged. Diet-controlled diabetes mellitus, mixed dyslipidemia and obesity.  Weight reduction stressed.  Diet and lifestyle modification was urged.  She started on statin therapy by primary care doctor who will follow her on a regular basis for this.  Risks and benefits emphasized and she understands.  She will be followed up in 6 months or earlier if she has any concerns. The patient mentions to me that she has episodes of chest tightness symptoms and presented to nitroglycerin prescription to be used as needed.  I wonder if she has any spasm of the coronary arteries that may be causing the symptoms.  Noncardiac etiologies must also be evaluated and she will contact her primary care for this.    Medication Adjustments/Labs and Tests Ordered: Current medicines are reviewed at length with the patient today.  Concerns regarding medicines are outlined above.  No orders of the defined types  were placed in this encounter.  No orders of the defined types were placed in this encounter.    Chief Complaint  Patient presents with  . Follow-up     History of Present Illness:    Stephanie Miles is a 40 y.o. female.  Patient has past medical history of artery regurgitation, bicuspid aortic valve, essential hypertension diabetes mellitus and morbid obesity.  She denies any problems at this time and takes care of activities of daily living.  No chest pain orthopnea or PND.  She mentions to me that she has chest tightness going to the neck at times.  She underwent CT angiography of the coronary arteries and this was unremarkable and I reassured her about the findings.  At the time of my evaluation, the patient is alert awake oriented and in no distress.  Past Medical History:  Diagnosis Date  . Allergy   . ANA positive 12/28/2016  . Anxiety   . Anxiety state 10/16/2013  . Aortic regurgitation due to bicuspid aortic valve   . Arthritis   . Asthma    allergy induced asthma   . Atypical chest pain 12/08/2020  . Bicuspid aortic valve 03/12/2014   Formatting of this note might be different from the original. Last Assessment & Plan:  Appears clinically compensated. Continue lasix.  . Black stools 09/13/2017  . Borderline diabetes 10/20/2016  . Chronic kidney disease    kidney stones  . Cough variant asthma vs uacs  07/07/2015   Spirometry 07/06/2015   Purely restrictive  so rec trial off qvar and rx dulera 100 2bid for flare off qvar > no flares on gerd rx as of 08/11/2015      . Depression   . Diabetes type 2, controlled ([place]) 10/20/2016  . Diarrhea 06/20/2017  . Dyspnea 07/07/2015   Spirometry 07/06/2015 purely restrictive   . Elevated LFTs 01/11/2017  . Essential hypertension 06/06/2013   Formatting of this note might be different from the original. Last Assessment & Plan:  Stable off of meds, monitor.  . Eustachian tube dysfunction, bilateral 10/10/2018   Last Assessment &  Plan:  Concern over her ears. Chronic history of gradual hearing loss.  No significant history of ear infections, surgery or trauma.  Her ears pop frequently and feel full.  There is some associated bilateral ringing.  She wants to understand is there anything that can be done to improve this.  Recent audiogram is reviewed and shows a  mild to severe mixed hearing loss. EXAM show  . Family history of breast cancer in female   . Family history of colon cancer   . Fatty liver 11/28/2016  . Fibromyalgia 04/04/2019  . Genetic testing 02/28/2017   Negative genetic testing on the common hereditary cancer panel.  The Hereditary Gene Panel offered by [facility] includes sequencing and/or deletion duplication testing of the following 46 genes: APC, ATM, AXIN2, BARD1, BMPR1A, BRCA1, BRCA2, BRIP1, CDH1, CDKN2A (p14ARF), CDKN2A (p16INK4a), CHEK2, CTNNA1, DICER1, EPCAM (Deletion/duplication testing only), GREM1 (promoter region deletion/duplication te  . GERD (gastroesophageal reflux disease)   . Heart murmur    bicuspid AV per pt   . Hepatocellular carcinoma ([place]) 01/31/2018  . History of chemotherapy    TACE for liver cancer   . History of kidney stones 10/21/2016  . Hyperlipidemia   . Hypertension   . Hypertriglyceridemia 04/05/2018  . IBS (irritable bowel syndrome)   . Insomnia   . Iron deficiency anemia due to chronic blood loss 03/21/2017  . Left leg injury, initial encounter 02/01/2018  . Leukocytosis 12/06/2016  . Liver cancer ([place]) 03/2017  . Liver lesion 06/20/2017  . Migraine 07/08/2013  . Migraine headache   . Migraine, unspecified, not intractable, without status migrainosus 07/08/2013   Formatting of this note might be different from the original. Last Assessment & Plan:  Stable, continue prn imitrex/zofran.  . 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 NASH (nonalcoholic steatohepatitis) 04/04/2017   Per biopsy 9/18  . Neuromuscular disorder ([place])    very mild  neuropathy hands  feet   . OCD (obsessive compulsive disorder)   . Orbital cellulitis    at age 19 was hospitalized for 3 months (almost died)   . OSA (obstructive sleep apnea)   . Osteoarthritis 10/21/2016  . Personal history of tobacco use, presenting hazards to health 01/20/2014  . Primary osteoarthritis of both knees 12/28/2016  . PTSD (post-traumatic stress disorder) 12/28/2016  . Right shoulder injury, subsequent encounter 02/01/2018  . RUQ abdominal pain 01/11/2017  . S/P appy 05/2013  . Severe obesity (BMI >= 40) ([place]) 07/07/2015   Formatting of this note might be different from the original. Last Assessment & Plan:  Formatting of this note might be different from the original. Body mass index is 40.21 kg/(m^2).  Lab Results  Component Value Date   TSH 0.562 07/09/2013   Contributing to gerd tendency/ doe/reviewed the need and the process to achieve and maintain neg calorie balance > defer f/u primary care including [facility]  .  Sleep apnea    wears cpap   . Tinnitus of both ears 10/11/2018  . Type 2 diabetes mellitus without complication, without long-term current use of insulin ([place]) 10/20/2016   Formatting of this note might be different from the original. Last Assessment & Plan:  Stable, continue diabetic diet.  . Vitamin D deficiency 11/01/2017    Past Surgical History:  Procedure Laterality Date  . APPENDECTOMY    . COLONOSCOPY    . DENTAL SURGERY     wisdom teeth  . ESOPHAGOGASTRODUODENOSCOPY    . LAPAROSCOPIC APPENDECTOMY N/A 06/06/2013   Procedure: APPENDECTOMY LAPAROSCOPIC;  Surgeon: Imogene Burn. Georgette Dover, MD;  Location: [facility];  Service: General;  Laterality: N/A;  . LIVER BIOPSY    . UPPER GASTROINTESTINAL ENDOSCOPY      Current Medications: Current Meds  Medication Sig  . acetaminophen (TYLENOL) 500 MG tablet Take 500 mg by mouth every 6 (six) hours as needed for mild pain, fever or headache.  . albuterol (VENTOLIN HFA) 108 (90 Base) MCG/ACT inhaler Inhale 2 puffs into the  lungs every 6 (six) hours as needed for wheezing or shortness of breath.  . ALPRAZolam (XANAX) 0.5 MG tablet Take 1 tablet (0.5 mg total) by mouth 2 (two) times daily as needed for anxiety.  Marland Kitchen atorvastatin (LIPITOR) 10 MG tablet Take 1 tablet (10 mg total) by mouth daily.  . budesonide-formoterol (SYMBICORT) 160-4.5 MCG/ACT inhaler Inhale 2 puffs into the lungs 2 (two) times daily.  Marland Kitchen buPROPion (WELLBUTRIN XL) 150 MG 24 hr tablet Take 150 mg by mouth daily.  . cetirizine (ZYRTEC) 10 MG tablet Take 10 mg by mouth daily.  . Cholecalciferol (VITAMIN D3) 50 MCG (2000 UT) TABS Take 2,000 Units by mouth daily.  . clotrimazole-betamethasone (LOTRISONE) cream Apply 1 application topically 2 (two) times daily.  Marland Kitchen dicyclomine (BENTYL) 20 MG tablet Take 20 mg by mouth 4 (four) times daily.  . DULoxetine (CYMBALTA) 60 MG capsule Take 60 mg by mouth daily.  Eduard Roux 70 MG/ML SOAJ Inject 70 mg into the skin every 30 (thirty) days.  . fluticasone (FLONASE) 50 MCG/ACT nasal spray Place 2 sprays into both nostrils daily.  . furosemide (LASIX) 40 MG tablet Take 40 mg by mouth daily in the afternoon.  . Lactulose 20 GM/30ML SOLN 15 ML by mouth twice daily.  May titrate as needed to 44m three times daily with goal of 2-3 soft BM's/day  . losartan (COZAAR) 100 MG tablet Take 100 mg by mouth daily.  . metFORMIN (GLUCOPHAGE) 500 MG tablet Take 1,000 mg by mouth daily with breakfast.  . metoprolol succinate (TOPROL-XL) 100 MG 24 hr tablet Take 100 mg by mouth daily.  . montelukast (SINGULAIR) 10 MG tablet Take 1 tablet (10 mg total) by mouth at bedtime.  . naproxen sodium (ALEVE) 220 MG tablet Take 440 mg by mouth 2 (two) times daily as needed for headache or pain (headache/pain).  .Marland Kitchenomeprazole (PRILOSEC) 20 MG capsule Take 40 mg by mouth in the morning. And takes 20 mg in the evening  . ondansetron (ZOFRAN-ODT) 4 MG disintegrating tablet Take 4 mg by mouth every 8 (eight) hours as needed for nausea/vomiting.   . potassium chloride (KLOR-CON) 10 MEQ tablet Take 10 mEq by mouth every other day.  . Prenatal Vit-Fe Fumarate-FA (MULTIVITAMIN-PRENATAL) 27-0.8 MG TABS tablet Take 1 tablet by mouth daily.   . Semaglutide,0.25 or 0.5MG/DOS, (OZEMPIC, 0.25 OR 0.5 MG/DOSE,) 2 MG/1.5ML SOPN Inject 0.25 mg into the skin once a week for 4  doses.  . Ubrogepant 100 MG TABS Take 100 mg by mouth as needed for headache.     Allergies:   Dust mite extract, Mold extract [trichophyton], Nsaids, Codeine, Amlodipine, and Tape   Social History   Socioeconomic History  . Marital status: Single    Spouse name: Not on file  . Number of children: 0  . Years of education: Not on file  . Highest education level: Not on file  Occupational History  . Occupation: Ship broker  . Occupation: Programmer, applications  Tobacco Use  . Smoking status: Former Smoker    Packs/day: 1.00    Years: 20.00    Pack years: 20.00    Types: Cigarettes    Quit date: 02/22/2013    Years since quitting: 7.8  . Smokeless tobacco: Never Used  Vaping Use  . Vaping Use: Never used  Substance and Sexual Activity  . Alcohol use: Yes    Comment: occ  . Drug use: No  . Sexual activity: Not on file  Other Topics Concern  . Not on file  Social History Narrative   Working at [employer] doing [employer] alone (has a Neurosurgeon)   Working on a degree at [employer], will plan to return fall 2018   Enjoys spending time with family.   Friends and family in [place] Determinants of Health   Financial Resource Strain: Not on file  Food Insecurity: Not on file  Transportation Needs: Not on file  Physical Activity: Not on file  Stress: Not on file  Social Connections: Not on file     Family History: The patient's family history includes AAA (abdominal aortic aneurysm) in her paternal grandmother; Asthma in her mother and sister; Breast cancer in an other family member; Breast cancer (age of onset: 1) in her father; Cancer in an other family  member; Cervical cancer in her maternal aunt; Colon cancer (age of onset: 17) in her maternal grandmother; Colon cancer (age of onset: 23) in an other family member; Colon polyps in her maternal grandfather; Diabetes in her father; Heart disease in her father and paternal grandfather; Heart disease (age of onset: 6) in her paternal uncle; Hyperlipidemia in her brother and father; Hypertension in her brother, father, and mother; Liver cancer in her maternal grandmother; Melanoma (age of onset: 44) in her maternal grandfather; Stomach cancer (age of onset: 45) in an other family member; Stroke (age of onset: 80) in her paternal uncle; Vision loss in her brother. There is no history of Esophageal cancer, Pancreatic cancer, or Rectal cancer.  ROS:   Please see the history of present illness.    All other systems reviewed and are negative.  EKGs/Labs/Other Studies Reviewed:    The following studies were reviewed today: IMPRESSION: 1. Coronary calcium score of 0. This was 0 percentile for age and sex matched control.  2.  Normal coronary origin with right dominance.  3. No obvious evidence of plaque. Study limited by noise artifact. CAD RADs 0.  4. Bicuspid aortic valve with small raphe with fusion of right and left coronary cusps.  5. Normal aortic dimensions at the AV commissures, Sinotubular junction, Ascending aorta, aortic arch and descending aorta.  6.  Consider non atherosclerotic causes of chest pain.   Recent Labs: 01/06/2020: TSH 1.51 09/02/2020: ALT 56; B Natriuretic Peptide 10.8 12/01/2020: BUN 5; Creatinine, Ser 0.88; Magnesium 1.7; Potassium 3.6; Sodium 137 12/08/2020: Hemoglobin 13.5; Platelets 198.0  Recent Lipid Panel  Component Value Date/Time   CHOL 207 (H) 09/23/2020 1314   TRIG 260.0 (H) 09/23/2020 1314   HDL 39.10 09/23/2020 1314   CHOLHDL 5 09/23/2020 1314   VLDL 52.0 (H) 09/23/2020 1314   LDLDIRECT 139.0 09/23/2020 1314    Physical Exam:    VS:  BP  120/80 (BP Location: Right Arm, Patient Position: Sitting, Cuff Size: Large)   Pulse 68   Ht 5' 6"  (1.676 m)   Wt 286 lb (129.7 kg)   SpO2 94%   BMI 46.16 kg/m     Wt Readings from Last 3 Encounters:  12/23/20 286 lb (129.7 kg)  12/08/20 287 lb (130.2 kg)  12/01/20 285 lb 8 oz (129.5 kg)     GEN: Patient is in no acute distress HEENT: Normal NECK: No JVD; No carotid bruits LYMPHATICS: No lymphadenopathy CARDIAC: Hear sounds regular, 2/6 systolic murmur at the apex. RESPIRATORY:  Clear to auscultation without rales, wheezing or rhonchi  ABDOMEN: Soft, non-tender, non-distended MUSCULOSKELETAL:  No edema; No deformity  SKIN: Warm and dry NEUROLOGIC:  Alert and oriented x 3 PSYCHIATRIC:  Normal affect   Signed, Jenean Lindau, MD  12/23/2020 1:40 PM    [facility]

## 2020-12-23 NOTE — Patient Instructions (Addendum)
Medication Instructions:  Your physician has recommended you make the following change in your medication:   Use nitroglycerin 1 tablet placed under the tongue at the first sign of chest pain or an angina attack. 1 tablet may be used every 5 minutes as needed, for up to 15 minutes. Do not take more than 3 tablets in 15 minutes. If pain persist call 911 or go to the nearest ED.   *If you need a refill on your cardiac medications before your next appointment, please call your pharmacy*   Lab Work: None ordered If you have labs (blood work) drawn today and your tests are completely normal, you will receive your results only by: Marland Kitchen MyChart Message (if you have MyChart) OR . A paper copy in the mail If you have any lab test that is abnormal or we need to change your treatment, we will call you to review the results.   Testing/Procedures: None ordered   Follow-Up: At [facility], you and your health needs are our priority.  As part of our continuing mission to provide you with exceptional heart care, we have created designated Provider Care Teams.  These Care Teams include your primary Cardiologist (physician) and Advanced Practice Providers (APPs -  Physician Assistants and Nurse Practitioners) who all work together to provide you with the care you need, when you need it.  We recommend signing up for the patient portal called "MyChart".  Sign up information is provided on this After Visit Summary.  MyChart is used to connect with patients for Virtual Visits (Telemedicine).  Patients are able to view lab/test results, encounter notes, upcoming appointments, etc.  Non-urgent messages can be sent to your provider as well.   To learn more about what you can do with MyChart, go to [email or web address].    Your next appointment:   6 month(s)  The format for your next appointment:   In Person  Provider:   Jyl Heinz, MD   Other Instructions Nitroglycerin sublingual tablets What is  this medicine? NITROGLYCERIN (nye troe GLI ser in) is a type of vasodilator. It relaxes blood vessels, increasing the blood and oxygen supply to your heart. This medicine is used to relieve chest pain caused by angina. It is also used to prevent chest pain before activities like climbing stairs, going outdoors in cold weather, or sexual activity. This medicine may be used for other purposes; ask your health care provider or pharmacist if you have questions. COMMON BRAND NAME(S): Nitroquick, Nitrostat, Nitrotab What should I tell my health care provider before I take this medicine? They need to know if you have any of these conditions:  anemia  head injury, recent stroke, or bleeding in the brain  liver disease  previous heart attack  an unusual or allergic reaction to nitroglycerin, other medicines, foods, dyes, or preservatives  pregnant or trying to get pregnant  breast-feeding How should I use this medicine? Take this medicine by mouth as needed. Use at the first sign of an angina attack (chest pain or tightness). You can also take this medicine 5 to 10 minutes before an event likely to produce chest pain. Follow the directions exactly as written on the prescription label. Place one tablet under your tongue and let it dissolve. Do not swallow whole. Replace the dose if you accidentally swallow it. It will help if your mouth is not dry. Saliva around the tablet will help it to dissolve more quickly. Do not eat or drink, smoke or chew tobacco while  a tablet is dissolving. Sit down when taking this medicine. In an angina attack, you should feel better within 5 minutes after your first dose. You can take a dose every 5 minutes up to a total of 3 doses. If you do not feel better or feel worse after 1 dose, call 9-1-1 at once. Do not take more than 3 doses in 15 minutes. Your health care provider might give you other directions. Follow those directions if he or she does. Do not take your medicine  more often than directed. Talk to your health care provider about the use of this medicine in children. Special care may be needed. Overdosage: If you think you have taken too much of this medicine contact a poison control center or emergency room at once. NOTE: This medicine is only for you. Do not share this medicine with others. What if I miss a dose? This does not apply. This medicine is only used as needed. What may interact with this medicine? Do not take this medicine with any of the following medications:  certain migraine medicines like ergotamine and dihydroergotamine (DHE)  medicines used to treat erectile dysfunction like sildenafil, tadalafil, and vardenafil  riociguat This medicine may also interact with the following medications:  alteplase  aspirin  heparin  medicines for high blood pressure  medicines for mental depression  other medicines used to treat angina  phenothiazines like chlorpromazine, mesoridazine, prochlorperazine, thioridazine This list may not describe all possible interactions. Give your health care provider a list of all the medicines, herbs, non-prescription drugs, or dietary supplements you use. Also tell them if you smoke, drink alcohol, or use illegal drugs. Some items may interact with your medicine. What should I watch for while using this medicine? Tell your doctor or health care professional if you feel your medicine is no longer working. Keep this medicine with you at all times. Sit or lie down when you take your medicine to prevent falling if you feel dizzy or faint after using it. Try to remain calm. This will help you to feel better faster. If you feel dizzy, take several deep breaths and lie down with your feet propped up, or bend forward with your head resting between your knees. You may get drowsy or dizzy. Do not drive, use machinery, or do anything that needs mental alertness until you know how this drug affects you. Do not stand or  sit up quickly, especially if you are an older patient. This reduces the risk of dizzy or fainting spells. Alcohol can make you more drowsy and dizzy. Avoid alcoholic drinks. Do not treat yourself for coughs, colds, or pain while you are taking this medicine without asking your doctor or health care professional for advice. Some ingredients may increase your blood pressure. What side effects may I notice from receiving this medicine? Side effects that you should report to your doctor or health care professional as soon as possible:  allergic reactions (skin rash, itching or hives; swelling of the face, lips, or tongue)  low blood pressure (dizziness; feeling faint or lightheaded, falls; unusually weak or tired)  low red blood cell counts (trouble breathing; feeling faint; lightheaded, falls; unusually weak or tired) Side effects that usually do not require medical attention (report to your doctor or health care professional if they continue or are bothersome):  facial flushing (redness)  headache  nausea, vomiting This list may not describe all possible side effects. Call your doctor for medical advice about side effects. You may  report side effects to FDA at 1-800-FDA-1088. Where should I keep my medicine? Keep out of the reach of children. Store at room temperature between 20 and 25 degrees C (68 and 77 degrees F). Store in [facility] of [employer]. Protect from light and moisture. Keep tightly closed. Throw away any unused medicine after the expiration date. NOTE: This sheet is a summary. It may not cover all possible information. If you have questions about this medicine, talk to your doctor, pharmacist, or health care provider.  2021 Elsevier/Gold Standard (2018-04-11 16:46:32)

## 2020-12-31 ENCOUNTER — Telehealth: Payer: Self-pay | Admitting: Family

## 2020-12-31 DIAGNOSIS — E785 Hyperlipidemia, unspecified: Secondary | ICD-10-CM

## 2020-12-31 NOTE — Telephone Encounter (Signed)
See order(s).

## 2021-01-05 ENCOUNTER — Other Ambulatory Visit: Payer: Self-pay

## 2021-01-05 ENCOUNTER — Other Ambulatory Visit (INDEPENDENT_AMBULATORY_CARE_PROVIDER_SITE_OTHER): Payer: Managed Care, Other (non HMO)

## 2021-01-05 DIAGNOSIS — E785 Hyperlipidemia, unspecified: Secondary | ICD-10-CM

## 2021-01-05 LAB — HEPATIC FUNCTION PANEL
ALT: 50 U/L — ABNORMAL HIGH (ref 0–35)
AST: 40 U/L — ABNORMAL HIGH (ref 0–37)
Albumin: 3.9 g/dL (ref 3.5–5.2)
Alkaline Phosphatase: 88 U/L (ref 39–117)
Bilirubin, Direct: 0.1 mg/dL (ref 0.0–0.3)
Total Bilirubin: 0.4 mg/dL (ref 0.2–1.2)
Total Protein: 6.5 g/dL (ref 6.0–8.3)

## 2021-01-05 LAB — LIPID PANEL
Cholesterol: 142 mg/dL (ref 0–200)
HDL: 31.8 mg/dL — ABNORMAL LOW (ref >39.00)
LDL Cholesterol: 75 mg/dL (ref 0–99)
NonHDL: 110.46
Total CHOL/HDL Ratio: 4
Triglycerides: 177 mg/dL — ABNORMAL HIGH (ref 0.0–149.0)
VLDL: 35.4 mg/dL (ref 0.0–40.0)

## 2021-01-07 ENCOUNTER — Other Ambulatory Visit: Payer: Self-pay | Admitting: Family

## 2021-02-03 ENCOUNTER — Other Ambulatory Visit: Payer: Self-pay | Admitting: Family

## 2021-02-03 DIAGNOSIS — E1165 Type 2 diabetes mellitus with hyperglycemia: Secondary | ICD-10-CM

## 2021-02-04 MED ORDER — METFORMIN HCL 500 MG PO TABS: 1000.0000 mg | ORAL_TABLET | Freq: Every day | ORAL | 3 refills | Status: DC

## 2021-02-04 NOTE — Telephone Encounter (Signed)
Requesting: alprazolam 0.84m Contract: None UDS: None Last Visit: 12/08/2020 Next Visit: None Last Refill: 01/08/2021 #45 and 0RF  Please Advise

## 2021-03-18 ENCOUNTER — Telehealth: Payer: Self-pay

## 2021-03-18 NOTE — Telephone Encounter (Signed)
New message    Pending   (Key: [id number]) Rx #: [id number] Aimovig 70MG/ML auto-injectors   Form [employer] Electronic PA Form [phone] NCPDP) Created 3 days ago Sent to Plan 7 minutes ago Plan Response 6 minutes ago Submit Clinical Questions 1 minute ago Determination Wait for Determination Please wait for [employer] 2017 to return a determination.

## 2021-03-19 NOTE — Telephone Encounter (Signed)
F/u   Approve   Message from [place];  Status:Approved;Review Type:Prior Auth;  Coverage Start Date:03/19/2021;Coverage End Date:03/19/2022;

## 2021-03-22 NOTE — Telephone Encounter (Signed)
F/u  Message from Plan  CaseId:[id number];Status:Approved;Review Type:Prior Auth;Coverage Start Date:03/19/2021;Coverage End Date:03/19/2022;

## 2021-04-27 ENCOUNTER — Ambulatory Visit: Payer: Managed Care, Other (non HMO) | Admitting: Neurology

## 2021-05-17 ENCOUNTER — Other Ambulatory Visit: Payer: Self-pay | Admitting: Family

## 2021-06-09 ENCOUNTER — Other Ambulatory Visit: Payer: Self-pay | Admitting: Family

## 2021-06-14 ENCOUNTER — Other Ambulatory Visit: Payer: Self-pay | Admitting: Family

## 2021-06-24 ENCOUNTER — Other Ambulatory Visit: Payer: Self-pay | Admitting: Family

## 2021-06-24 ENCOUNTER — Other Ambulatory Visit: Payer: Self-pay | Admitting: Neurology

## 2021-06-24 ENCOUNTER — Other Ambulatory Visit: Payer: Self-pay | Admitting: Cardiology

## 2021-06-24 ENCOUNTER — Telehealth: Payer: Self-pay | Admitting: Family

## 2021-06-24 DIAGNOSIS — Z3490 Encounter for supervision of normal pregnancy, unspecified, unspecified trimester: Secondary | ICD-10-CM

## 2021-06-24 NOTE — Telephone Encounter (Signed)
See mychart.  

## 2021-07-08 NOTE — Progress Notes (Signed)
Virtual Visit via Video Note The purpose of this virtual visit is to provide medical care while limiting exposure to the novel coronavirus.    Consent was obtained for video visit:  Yes.   Answered questions that patient had about telehealth interaction:  Yes.   I discussed the limitations, risks, security and privacy concerns of performing an evaluation and management service by telemedicine. I also discussed with the patient that there may be a patient responsible charge related to this service. The patient expressed understanding and agreed to proceed.  Pt location: Home Physician Location: office Name of referring provider:  Debbrah Alar, NP I connected with Stephanie Miles at patients initiation/request on 07/09/2021 at  9:50 AM EST by video enabled telemedicine application and verified that I am speaking with the correct person using two identifiers. Pt MRN:  [id number] Pt DOB:  1980-08-13 Video Participants:  Stephanie Miles  Assessment and Plan:   Migraine without aura, without status migrainosus, not intractable  Migraine prevention:  Aimovig 95m  Migraine rescue:  Ubrelvy 102mLimit use of pain relievers to no more than 2 days out of week to prevent risk of rebound or medication-overuse headache. Keep headache diary Follow up with me in one year unless she is able to establish care with a local neurologist.   History of Present Illness:  Stephanie Miles a 89ear old female with NASH, HTN, type 2 diabetes, fibromyalgia, OSA, depression and CKD with history of kidney stones who follows up for migraines.   UPDATE: [place] [facility] been able to establish care with a local neurologist.  Most of the neurologists are not taking/or are selective in taking on new patients.  Migraines are controlled. Intensity:  moderate Duration:  TaLorrin Goodelloes to sleep for 30 minutes and resolved. Frequency:  Once every 3 to 4  weeks Frequency of abortive medication: every 10 to 14 days Current NSAIDS:  Contraindicated Current analgesics:  Tylenol Current triptans:  none Current ergotamine:  none Current anti-emetic:  Zofran 86m21murrent muscle relaxants:  none Current anti-anxiolytic:  alprazolam Current sleep aide:  none Current Antihypertensive medications:  Toprol XL 100m37mrosemide; losartan Current Antidepressant medications:  Wellbutrin XL 150mg41mmbalta 60mg 62my Current Anticonvulsant medications:  none Current anti-CGRP:  Aimovig 70mg, 43mlvy 100mg Cu78mt Vitamins/Herbal/Supplements:  MVI; prenatal, K-dur Current Antihistamines/Decongestants:  Zyrtec; Flonase Other therapy:  none Hormone/birth control:  none Other medications:  lactulose   Caffeine:  1 to 2 cups of coffee daily, 1 to 2 Red Bulls daily Alcohol:  no Smoker:  no Diet:  Hydrates with water, Exercise:  Walks her dog Depression:  stable; Anxiety:  yes Other pain:  fibromyalgia Sleep hygiene:  OSA.  Uses CPAP.   HISTORY:  She reports migraines since 23 years88ld.  They have become worse since October.  She has hepatocellular carcinoma and NASH.  Around that time, she started not feeling well and was found to have an elevated ammonia level of 54.  She was started on lactulose.  She also had fallen around that time and hit her head.  Previously, the migraines would occur every few months.  Since October, they occur about 4 times a month, lasting from a few hours to all day.  They are severe left or right sided headaches radiating down back of head.  There is associated nausea, photophobia, phonophobia, but no vomiting, visual disturbance or unilateral numbness or weakness.  No particular triggers.  Tylenol helps ease the pain but does not resolve it.  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     Past NSAIDS/steroid:  Ibuprofen; Mobic; naproxen; prednisone Past  analgesics:  Excedrin Past abortive triptans:  Sumatriptan Past abortive ergotamine:  none Past muscle relaxants:  none Past anti-emetic:  Promethazine  Past antihypertensive medications:  lisinopril Past antidepressant medications:  Venlafaxine 127m twice daily; Lexapro; trazodone Past anticonvulsant medications:  none Past anti-CGRP:  none Past vitamins/Herbal/Supplements:  magnesium Past antihistamines/decongestants:  none Other past therapies:  none     Family history of headache:  Brother, sister  Past Medical History: Past Medical History:  Diagnosis Date   Allergy    ANA positive 12/28/2016   Anxiety    Anxiety state 10/16/2013   Aortic regurgitation due to bicuspid aortic valve    Arthritis    Asthma    allergy induced asthma    Atypical chest pain 12/08/2020   Bicuspid aortic valve 03/12/2014   Formatting of this note might be different from the original. Last Assessment & Plan:  Appears clinically compensated. Continue lasix.   Black stools 09/13/2017   Borderline diabetes 10/20/2016   Chronic kidney disease    kidney stones   Cough variant asthma vs uacs  07/07/2015   Spirometry 07/06/2015   Purely restrictive so rec trial off qvar and rx dulera 100 2bid for flare off qvar > no flares on gerd rx as of 08/11/2015       Depression    Diabetes type 2, controlled ([place] 10/20/2016   Diarrhea 06/20/2017   Dyspnea 07/07/2015   Spirometry 07/06/2015 purely restrictive    Elevated LFTs 01/11/2017   Essential hypertension 06/06/2013   Formatting of this note might be different from the original. Last Assessment & Plan:  Stable off of meds, monitor.   Eustachian tube dysfunction, bilateral 10/10/2018   Last Assessment & Plan:  Concern over her ears. Chronic history of gradual hearing loss.  No significant history of ear infections, surgery or trauma.  Her ears pop frequently and feel full.  There is some associated bilateral ringing.  She wants to understand is there anything that  can be done to improve this.  Recent audiogram is reviewed and shows a  mild to severe mixed hearing loss. EXAM show   Family history of breast cancer in female    Family history of colon cancer    Fatty liver 11/28/2016   Fibromyalgia 04/04/2019   Genetic testing 02/28/2017   Negative genetic testing on the common hereditary cancer panel.  The Hereditary Gene Panel offered by [facility] includes sequencing and/or deletion duplication testing of the following 46 genes: APC, ATM, AXIN2, BARD1, BMPR1A, BRCA1, BRCA2, BRIP1, CDH1, CDKN2A (p14ARF), CDKN2A (p16INK4a), CHEK2, CTNNA1, DICER1, EPCAM (Deletion/duplication testing only), GREM1 (promoter region deletion/duplication te   GERD (gastroesophageal reflux disease)    Heart murmur    bicuspid AV per pt    [place]atocellular [place] 01/31/2018   History of chemotherapy    TACE for liver cancer    History of kidney stones 10/21/2016   Hyperlipidemia    Hypertension    Hypertriglyceridemia 04/05/2018   IBS (irritable bowel syndrome)    Insomnia    Iron deficiency anemia due to chronic blood loss 03/21/2017   Left leg injury, initial encounter 02/01/2018   Leukocytosis 12/06/2016   [place] 03[place]   Liver lesion 06/20/2017   Migraine 07/08/2013   Migraine headache  Migraine, unspecified, not intractable, without status migrainosus 07/08/2013   Formatting of this note might be different from the original. Last Assessment & Plan:  Stable, continue prn imitrex/zofran.   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   NASH (nonalcoholic steatohepatitis) 04/04/2017   Per biopsy 9/18   Neuromuscular disorder ([place])    very mild neuropathy hands  feet    OCD (obsessive compulsive disorder)    Orbital cellulitis    at age 5 was hospitalized for 3 months (almost died)    OSA (obstructive sleep apnea)    Osteoarthritis 10/21/2016   Personal history of tobacco use, presenting hazards to health 01/20/2014   Primary  osteoarthritis of both knees 12/28/2016   PTSD (post-traumatic stress disorder) 12/28/2016   Right shoulder injury, subsequent encounter 02/01/2018   RUQ abdominal pain 01/11/2017   S/P appy 05/2013   Severe obesity (BMI >= 40) ([place]) 07/07/2015   Formatting of this note might be different from the original. Last Assessment & Plan:  Formatting of this note might be different from the original. Body mass index is 40.21 kg/(m^2).  Lab Results  Component Value Date   TSH 0.562 07/09/2013   Contributing to gerd tendency/ doe/reviewed the need and the process to achieve and maintain neg calorie balance > defer f/u primary care including [facility]   Sleep apnea    wears cpap    Tinnitus of both ears 10/11/2018   Type 2 diabetes mellitus without complication, without long-term current use of insulin ([place]) 10/20/2016   Formatting of this note might be different from the original. Last Assessment & Plan:  Stable, continue diabetic diet.   Vitamin D deficiency 11/01/2017    Medications: Outpatient Encounter Medications as of 07/09/2021  Medication Sig   Accu-Chek FastClix Lancets MISC USE AS DIRECTED TO TEST BLOOD SUGAR LEVELS TWICE DAILY   ACCU-CHEK GUIDE test strip USE AS DIRECTED TO TEST BLOOD SUGAR LEVELS TWICE DAILY   acetaminophen (TYLENOL) 500 MG tablet Take 500 mg by mouth every 6 (six) hours as needed for mild pain, fever or headache.   AIMOVIG 70 MG/ML SOAJ INEJCT UNDER THE SKIN ONCE EVERY 30 DAYS   albuterol (VENTOLIN HFA) 108 (90 Base) MCG/ACT inhaler Inhale 2 puffs into the lungs every 6 (six) hours as needed for wheezing or shortness of breath.   ALPRAZolam (XANAX) 0.5 MG tablet TAKE ONE TABLET BY MOUTH TWICE A DAY AS NEEDED FOR ANXIETY   atorvastatin (LIPITOR) 10 MG tablet Take 1 tablet (10 mg total) by mouth daily.   buPROPion (WELLBUTRIN XL) 150 MG 24 hr tablet Take 150 mg by mouth daily.   cetirizine (ZYRTEC) 10 MG tablet Take 10 mg by mouth daily.   Cholecalciferol (VITAMIN D3) 50 MCG (2000  UT) TABS Take 2,000 Units by mouth daily.   clotrimazole-betamethasone (LOTRISONE) cream Apply 1 application topically 2 (two) times daily.   dicyclomine (BENTYL) 20 MG tablet TAKE ONE TABLET BY MOUTH FOUR TIMES A DAY BEFORE MEALS AND AT BEDTIME   DULoxetine (CYMBALTA) 60 MG capsule Take 60 mg by mouth daily.   fluticasone (FLONASE) 50 MCG/ACT nasal spray SPRAY TWO SPRAYS IN EACH NOSTRIL ONCE DAILY   furosemide (LASIX) 40 MG tablet Take 40 mg by mouth daily in the afternoon.   Lactulose 20 GM/30ML SOLN 15 ML by mouth twice daily.  May titrate as needed to 34m three times daily with goal of 2-3 soft BM's/day   losartan (COZAAR) 100 MG tablet Take 100 mg by  mouth daily.   metFORMIN (GLUCOPHAGE) 500 MG tablet TAKE 2 TABLETS BY MOUTH EVERY MORNING   metoprolol succinate (TOPROL-XL) 100 MG 24 hr tablet Take 1 tablet (100 mg total) by mouth daily. Take with or immediately following a meal   montelukast (SINGULAIR) 10 MG tablet Take 1 tablet (10 mg total) by mouth at bedtime.   naproxen sodium (ALEVE) 220 MG tablet Take 440 mg by mouth 2 (two) times daily as needed for headache or pain (headache/pain).   nitroGLYCERIN (NITROSTAT) 0.4 MG SL tablet Place 1 tablet (0.4 mg total) under the tongue every 5 (five) minutes as needed.   omeprazole (PRILOSEC) 20 MG capsule Take 40 mg by mouth in the morning. And takes 20 mg in the evening   ondansetron (ZOFRAN-ODT) 4 MG disintegrating tablet DISSOLVE ONE TABLET BY MOUTH EVERY 8 HOURS AS NEEDED FOR NAUSEA AND VOMITING   potassium chloride (KLOR-CON) 10 MEQ tablet Take 1 tablet (10 mEq total) by mouth every other day.   Prenatal Vit-Fe Fumarate-FA (MULTIVITAMIN-PRENATAL) 27-0.8 MG TABS tablet Take 1 tablet by mouth daily.    Semaglutide,0.25 or 0.5MG/DOS, (OZEMPIC, 0.25 OR 0.5 MG/DOSE,) 2 MG/1.5ML SOPN DIAL AND INJECT UNDER THE SKIN 0.25 MG WEEKLY FOR 4 WEEKS   SYMBICORT 160-4.5 MCG/ACT inhaler INHALE 2 PUFFS BY MOUTH TWICE DAILY   Ubrogepant 100 MG TABS Take 100  mg by mouth as needed for headache.   No facility-administered encounter medications on file as of 07/09/2021.    Allergies: Allergies  Allergen Reactions   Dust Mite Extract Shortness Of Breath and Cough   Mold Extract [Trichophyton] Shortness Of Breath and Cough   Nsaids Other (See Comments)    Patient is to NOT have NSAIDS   Codeine Nausea Only    Required use of an antiemetic    Amlodipine Swelling and Other (See Comments)    Edema    Tape Rash    Medical tape and Band-Aids aren't tolerated    Family History: Family History  Problem Relation Age of Onset   Asthma Mother    Hypertension Mother    Breast cancer Father 20   Diabetes Father    Hypertension Father    Heart disease Father    Hyperlipidemia Father    Asthma Sister    Hyperlipidemia Brother    Hypertension Brother    Vision loss Brother    Colon cancer Maternal Grandmother 41   Liver cancer Maternal Grandmother    Cervical cancer Maternal Aunt    Stroke Paternal Uncle 45   Melanoma Maternal Grandfather 79   Colon polyps Maternal Grandfather    AAA (abdominal aortic aneurysm) Paternal Grandmother    Heart disease Paternal Grandfather    Heart disease Paternal Uncle 62   Colon cancer Other 58       MGM's mother   Stomach cancer Other 26       MGM's mother   Breast cancer Other        MGM's maternal aunt   Cancer Other        MGM's maternal uncle with GI cancer   Esophageal cancer Neg Hx    Pancreatic cancer Neg Hx    Rectal cancer Neg Hx     Observations/Objective:   Height 5' 6"  (1.676 m), weight 250 lb (113.4 kg). No acute distress.  Alert and oriented.  Speech fluent and not dysarthric.  Language intact.     Follow Up Instructions:    -I discussed the assessment and treatment plan with the  patient. The patient was provided an opportunity to ask questions and all were answered. The patient agreed with the plan and demonstrated an understanding of the instructions.   The patient was  advised to call back or seek an in-person evaluation if the symptoms worsen or if the condition fails to improve as anticipated.   Dudley Major, DO

## 2021-07-09 ENCOUNTER — Telehealth (INDEPENDENT_AMBULATORY_CARE_PROVIDER_SITE_OTHER): Payer: Managed Care, Other (non HMO) | Admitting: Neurology

## 2021-07-09 ENCOUNTER — Other Ambulatory Visit: Payer: Self-pay

## 2021-07-09 ENCOUNTER — Encounter: Payer: Self-pay | Admitting: Neurology

## 2021-07-09 VITALS — Ht 66.0 in | Wt 250.0 lb

## 2021-07-09 DIAGNOSIS — G43009 Migraine without aura, not intractable, without status migrainosus: Secondary | ICD-10-CM

## 2021-07-09 NOTE — Progress Notes (Signed)
New PCP Joretta Bachelor PA

## 2021-07-14 ENCOUNTER — Telehealth: Payer: Self-pay

## 2021-07-14 NOTE — Telephone Encounter (Signed)
New message   This request has been approved.  Please note any additional information provided by [employer] at the bottom of your screen.  Donia Guiles Key: [id number] - PA Case ID: [id number] Need help? Call us at [phone] Outcome Approvedtoday CaseId:[id number];Status:Approved;Review Type:Prior Auth;Coverage Start Date:07/14/2021;Coverage End Date:07/14/2022; Drug Ubrelvy 100MG tablets Form [employer] Electronic PA Form [phone] NCPDP)

## 2021-09-21 ENCOUNTER — Other Ambulatory Visit: Payer: Self-pay | Admitting: Neurology

## 2021-10-18 ENCOUNTER — Other Ambulatory Visit: Payer: Self-pay | Admitting: Neurology

## 2021-12-08 ENCOUNTER — Telehealth: Payer: Managed Care, Other (non HMO) | Admitting: Neurology

## 2022-02-18 ENCOUNTER — Telehealth (HOSPITAL_COMMUNITY): Payer: Self-pay | Admitting: Pharmacy Technician

## 2022-02-18 NOTE — Telephone Encounter (Signed)
Patient Advocate Encounter   Received notification that prior authorization for Aimovig 70MG/ML auto-injectors is required.   PA submitted on 02/18/2022 Key [id number] Status is pending       Lyndel Safe, [place] Patient Advocate Specialist [place] Patient Advocate Team Direct Number: [phone]  Fax: [phone]

## 2022-03-02 NOTE — Telephone Encounter (Signed)
Patient Advocate Encounter  Prior Authorization for Aimovig 70MG/ML auto-injectors has been approved.    PA# [id number] Effective dates: 02/18/2022 through 02/18/2023     Lyndel Safe, [place] Patient Advocate Specialist [place] Patient Advocate Team Direct Number: [phone]  Fax: [phone]

## 2022-07-04 ENCOUNTER — Encounter: Payer: Self-pay | Admitting: Family

## 2022-07-04 ENCOUNTER — Telehealth: Payer: Self-pay | Admitting: Pharmacy Technician

## 2022-07-04 ENCOUNTER — Other Ambulatory Visit (HOSPITAL_COMMUNITY): Payer: Self-pay

## 2022-07-04 NOTE — Telephone Encounter (Signed)
Patient Advocate Encounter  Prior Authorization for Ubrelvy 100MG tablets has been approved.    PA# [id number] Key: [id number] Effective dates: 07/04/2022 through 07/04/2023      Lyndel Safe, [place] Patient Advocate Specialist [place] Patient Advocate Team Direct Number: [phone]  Fax: [phone]

## 2022-07-11 ENCOUNTER — Ambulatory Visit: Payer: Managed Care, Other (non HMO) | Admitting: Neurology

## 2023-02-13 ENCOUNTER — Encounter: Payer: Self-pay | Admitting: Family

## 2023-02-13 ENCOUNTER — Telehealth: Payer: Self-pay | Admitting: Pharmacy Technician

## 2023-02-13 ENCOUNTER — Other Ambulatory Visit (HOSPITAL_COMMUNITY): Payer: Self-pay

## 2023-02-13 NOTE — Telephone Encounter (Signed)
Pharmacy Patient Advocate Encounter   Received notification from CoverMyMeds that prior authorization for AIMOVIG 70MG  is required/requested.   Insurance verification completed.   The patient is insured through [employer] .   Per test claim: PA submitted to [employer] via CoverMyMeds Key/confirmation #/EOC [id number] Status is pending

## 2023-05-30 ENCOUNTER — Encounter: Payer: Self-pay | Admitting: Gastroenterology
